# Patient Record
Sex: Female | Born: 1965
Health system: Southern US, Community
[De-identification: ages and names within clinical notes are randomized; demographics above are authoritative.]

## PROBLEM LIST (undated history)

## (undated) DIAGNOSIS — R413 Other amnesia: Secondary | ICD-10-CM

## (undated) DIAGNOSIS — R590 Localized enlarged lymph nodes: Secondary | ICD-10-CM

## (undated) DIAGNOSIS — M25561 Pain in right knee: Secondary | ICD-10-CM

## (undated) DIAGNOSIS — K30 Functional dyspepsia: Secondary | ICD-10-CM

## (undated) DIAGNOSIS — Z789 Other specified health status: Secondary | ICD-10-CM

## (undated) DIAGNOSIS — K224 Dyskinesia of esophagus: Secondary | ICD-10-CM

## (undated) DIAGNOSIS — T4145XA Adverse effect of unspecified anesthetic, initial encounter: Secondary | ICD-10-CM

## (undated) DIAGNOSIS — K589 Irritable bowel syndrome without diarrhea: Secondary | ICD-10-CM

## (undated) DIAGNOSIS — J302 Other seasonal allergic rhinitis: Secondary | ICD-10-CM

## (undated) DIAGNOSIS — Z95828 Presence of other vascular implants and grafts: Secondary | ICD-10-CM

## (undated) DIAGNOSIS — M8700 Idiopathic aseptic necrosis of unspecified bone: Secondary | ICD-10-CM

## (undated) DIAGNOSIS — D1803 Hemangioma of intra-abdominal structures: Secondary | ICD-10-CM

## (undated) DIAGNOSIS — G894 Chronic pain syndrome: Secondary | ICD-10-CM

## (undated) DIAGNOSIS — E890 Postprocedural hypothyroidism: Secondary | ICD-10-CM

## (undated) DIAGNOSIS — F1190 Opioid use, unspecified, uncomplicated: Secondary | ICD-10-CM

## (undated) DIAGNOSIS — A419 Sepsis, unspecified organism: Secondary | ICD-10-CM

## (undated) DIAGNOSIS — Z808 Family history of malignant neoplasm of other organs or systems: Secondary | ICD-10-CM

## (undated) DIAGNOSIS — R198 Other specified symptoms and signs involving the digestive system and abdomen: Secondary | ICD-10-CM

## (undated) DIAGNOSIS — Z9889 Other specified postprocedural states: Secondary | ICD-10-CM

## (undated) DIAGNOSIS — Z9289 Personal history of other medical treatment: Secondary | ICD-10-CM

## (undated) DIAGNOSIS — K760 Fatty (change of) liver, not elsewhere classified: Secondary | ICD-10-CM

## (undated) DIAGNOSIS — C4491 Basal cell carcinoma of skin, unspecified: Secondary | ICD-10-CM

## (undated) DIAGNOSIS — Z806 Family history of leukemia: Secondary | ICD-10-CM

## (undated) DIAGNOSIS — T8859XA Other complications of anesthesia, initial encounter: Secondary | ICD-10-CM

## (undated) DIAGNOSIS — F419 Anxiety disorder, unspecified: Secondary | ICD-10-CM

## (undated) DIAGNOSIS — M5430 Sciatica, unspecified side: Secondary | ICD-10-CM

## (undated) DIAGNOSIS — Z803 Family history of malignant neoplasm of breast: Secondary | ICD-10-CM

## (undated) DIAGNOSIS — Z8042 Family history of malignant neoplasm of prostate: Secondary | ICD-10-CM

## (undated) DIAGNOSIS — M5030 Other cervical disc degeneration, unspecified cervical region: Secondary | ICD-10-CM

## (undated) DIAGNOSIS — Z8041 Family history of malignant neoplasm of ovary: Secondary | ICD-10-CM

## (undated) DIAGNOSIS — M1990 Unspecified osteoarthritis, unspecified site: Secondary | ICD-10-CM

## (undated) DIAGNOSIS — C439 Malignant melanoma of skin, unspecified: Secondary | ICD-10-CM

## (undated) DIAGNOSIS — M797 Fibromyalgia: Secondary | ICD-10-CM

## (undated) DIAGNOSIS — R1310 Dysphagia, unspecified: Secondary | ICD-10-CM

## (undated) DIAGNOSIS — D649 Anemia, unspecified: Secondary | ICD-10-CM

## (undated) DIAGNOSIS — M5136 Other intervertebral disc degeneration, lumbar region: Secondary | ICD-10-CM

## (undated) DIAGNOSIS — J189 Pneumonia, unspecified organism: Secondary | ICD-10-CM

## (undated) DIAGNOSIS — R112 Nausea with vomiting, unspecified: Secondary | ICD-10-CM

## (undated) DIAGNOSIS — I10 Essential (primary) hypertension: Secondary | ICD-10-CM

## (undated) DIAGNOSIS — Z87442 Personal history of urinary calculi: Secondary | ICD-10-CM

## (undated) DIAGNOSIS — J45909 Unspecified asthma, uncomplicated: Secondary | ICD-10-CM

## (undated) DIAGNOSIS — K449 Diaphragmatic hernia without obstruction or gangrene: Secondary | ICD-10-CM

## (undated) DIAGNOSIS — Z8489 Family history of other specified conditions: Secondary | ICD-10-CM

## (undated) DIAGNOSIS — IMO0002 Reserved for concepts with insufficient information to code with codable children: Secondary | ICD-10-CM

## (undated) DIAGNOSIS — Z85828 Personal history of other malignant neoplasm of skin: Secondary | ICD-10-CM

## (undated) DIAGNOSIS — N3010 Interstitial cystitis (chronic) without hematuria: Secondary | ICD-10-CM

## (undated) DIAGNOSIS — M51369 Other intervertebral disc degeneration, lumbar region without mention of lumbar back pain or lower extremity pain: Secondary | ICD-10-CM

## (undated) DIAGNOSIS — G43711 Chronic migraine without aura, intractable, with status migrainosus: Secondary | ICD-10-CM

## (undated) DIAGNOSIS — Z87898 Personal history of other specified conditions: Secondary | ICD-10-CM

## (undated) DIAGNOSIS — M2560 Stiffness of unspecified joint, not elsewhere classified: Secondary | ICD-10-CM

## (undated) HISTORY — PX: CARPAL TUNNEL RELEASE: SHX101

## (undated) HISTORY — PX: TONSILLECTOMY AND ADENOIDECTOMY: SUR1326

## (undated) HISTORY — PX: REFRACTIVE SURGERY: SHX103

## (undated) HISTORY — PX: BREAST SURGERY: SHX581

## (undated) HISTORY — PX: AUGMENTATION MAMMAPLASTY: SUR837

## (undated) HISTORY — PX: OTHER SURGICAL HISTORY: SHX169

## (undated) HISTORY — DX: Sepsis, unspecified organism: A41.9

## (undated) HISTORY — PX: KIDNEY STONE SURGERY: SHX686

## (undated) HISTORY — DX: Family history of leukemia: Z80.6

## (undated) HISTORY — DX: Idiopathic aseptic necrosis of unspecified bone: M87.00

## (undated) HISTORY — PX: EXCISION HAGLUND'S DEFORMITY WITH ACHILLES TENDON REPAIR: SHX5627

## (undated) HISTORY — PX: SHOULDER ARTHROSCOPY: SHX128

## (undated) HISTORY — PX: APPENDECTOMY: SHX54

## (undated) HISTORY — DX: Family history of malignant neoplasm of prostate: Z80.42

## (undated) HISTORY — DX: Family history of malignant neoplasm of other organs or systems: Z80.8

## (undated) HISTORY — DX: Irritable bowel syndrome, unspecified: K58.9

## (undated) HISTORY — DX: Family history of malignant neoplasm of breast: Z80.3

## (undated) HISTORY — PX: TOTAL KNEE ARTHROPLASTY: SHX125

## (undated) HISTORY — DX: Family history of malignant neoplasm of ovary: Z80.41

## (undated) HISTORY — PX: FISSURECTOMY: SHX5244

---

## 1991-02-23 HISTORY — PX: TOTAL THYROIDECTOMY: SHX2547

## 1996-02-23 HISTORY — PX: OTHER SURGICAL HISTORY: SHX169

## 1997-03-25 HISTORY — PX: LAPAROSCOPIC ASSISTED VAGINAL HYSTERECTOMY: SHX5398

## 1997-05-28 ENCOUNTER — Ambulatory Visit (HOSPITAL_COMMUNITY): Admission: RE | Admit: 1997-05-28 | Discharge: 1997-05-28 | Payer: Self-pay | Admitting: *Deleted

## 1997-09-02 ENCOUNTER — Emergency Department (HOSPITAL_COMMUNITY): Admission: EM | Admit: 1997-09-02 | Discharge: 1997-09-02 | Payer: Self-pay | Admitting: Emergency Medicine

## 1997-09-23 ENCOUNTER — Emergency Department (HOSPITAL_COMMUNITY): Admission: EM | Admit: 1997-09-23 | Discharge: 1997-09-23 | Payer: Self-pay | Admitting: Emergency Medicine

## 1997-10-22 ENCOUNTER — Inpatient Hospital Stay (HOSPITAL_COMMUNITY): Admission: EM | Admit: 1997-10-22 | Discharge: 1997-10-24 | Payer: Self-pay | Admitting: Emergency Medicine

## 1997-11-10 ENCOUNTER — Emergency Department (HOSPITAL_COMMUNITY): Admission: EM | Admit: 1997-11-10 | Discharge: 1997-11-10 | Payer: Self-pay

## 1997-11-13 ENCOUNTER — Ambulatory Visit (HOSPITAL_COMMUNITY): Admission: RE | Admit: 1997-11-13 | Discharge: 1997-11-13 | Payer: Self-pay | Admitting: Gastroenterology

## 1997-11-14 ENCOUNTER — Ambulatory Visit (HOSPITAL_COMMUNITY): Admission: RE | Admit: 1997-11-14 | Discharge: 1997-11-14 | Payer: Self-pay | Admitting: Gastroenterology

## 1997-11-14 ENCOUNTER — Encounter: Payer: Self-pay | Admitting: Gastroenterology

## 1997-12-31 ENCOUNTER — Encounter: Payer: Self-pay | Admitting: Internal Medicine

## 1997-12-31 ENCOUNTER — Ambulatory Visit (HOSPITAL_COMMUNITY): Admission: RE | Admit: 1997-12-31 | Discharge: 1997-12-31 | Payer: Self-pay | Admitting: Internal Medicine

## 1998-02-27 ENCOUNTER — Encounter: Admission: RE | Admit: 1998-02-27 | Discharge: 1998-03-24 | Payer: Self-pay | Admitting: Orthopedic Surgery

## 1998-04-08 ENCOUNTER — Ambulatory Visit (HOSPITAL_BASED_OUTPATIENT_CLINIC_OR_DEPARTMENT_OTHER): Admission: RE | Admit: 1998-04-08 | Discharge: 1998-04-08 | Payer: Self-pay | Admitting: Orthopaedic Surgery

## 1998-10-22 ENCOUNTER — Ambulatory Visit (HOSPITAL_COMMUNITY): Admission: RE | Admit: 1998-10-22 | Discharge: 1998-10-22 | Payer: Self-pay | Admitting: Family Medicine

## 1998-10-22 ENCOUNTER — Encounter: Payer: Self-pay | Admitting: Family Medicine

## 1998-12-30 ENCOUNTER — Ambulatory Visit (HOSPITAL_COMMUNITY): Admission: RE | Admit: 1998-12-30 | Discharge: 1998-12-30 | Payer: Self-pay | Admitting: Internal Medicine

## 1999-02-11 ENCOUNTER — Ambulatory Visit (HOSPITAL_COMMUNITY): Admission: RE | Admit: 1999-02-11 | Discharge: 1999-02-11 | Payer: Self-pay | Admitting: Internal Medicine

## 1999-03-10 ENCOUNTER — Emergency Department (HOSPITAL_COMMUNITY): Admission: EM | Admit: 1999-03-10 | Discharge: 1999-03-10 | Payer: Self-pay | Admitting: Emergency Medicine

## 1999-07-08 ENCOUNTER — Encounter: Payer: Self-pay | Admitting: Emergency Medicine

## 1999-07-08 ENCOUNTER — Emergency Department (HOSPITAL_COMMUNITY): Admission: EM | Admit: 1999-07-08 | Discharge: 1999-07-08 | Payer: Self-pay | Admitting: Emergency Medicine

## 1999-12-01 ENCOUNTER — Ambulatory Visit (HOSPITAL_BASED_OUTPATIENT_CLINIC_OR_DEPARTMENT_OTHER): Admission: RE | Admit: 1999-12-01 | Discharge: 1999-12-01 | Payer: Self-pay | Admitting: Orthopaedic Surgery

## 2000-01-28 ENCOUNTER — Other Ambulatory Visit: Admission: RE | Admit: 2000-01-28 | Discharge: 2000-01-28 | Payer: Self-pay | Admitting: *Deleted

## 2000-03-07 ENCOUNTER — Ambulatory Visit (HOSPITAL_COMMUNITY): Admission: RE | Admit: 2000-03-07 | Discharge: 2000-03-07 | Payer: Self-pay | Admitting: Gastroenterology

## 2000-03-23 ENCOUNTER — Ambulatory Visit (HOSPITAL_COMMUNITY): Admission: RE | Admit: 2000-03-23 | Discharge: 2000-03-23 | Payer: Self-pay | Admitting: Gastroenterology

## 2000-03-23 ENCOUNTER — Encounter: Payer: Self-pay | Admitting: Gastroenterology

## 2000-04-15 ENCOUNTER — Ambulatory Visit (HOSPITAL_COMMUNITY): Admission: RE | Admit: 2000-04-15 | Discharge: 2000-04-15 | Payer: Self-pay | Admitting: Gastroenterology

## 2000-04-18 ENCOUNTER — Encounter (INDEPENDENT_AMBULATORY_CARE_PROVIDER_SITE_OTHER): Payer: Self-pay | Admitting: Specialist

## 2000-04-18 ENCOUNTER — Ambulatory Visit (HOSPITAL_COMMUNITY): Admission: RE | Admit: 2000-04-18 | Discharge: 2000-04-18 | Payer: Self-pay | Admitting: Obstetrics and Gynecology

## 2000-04-18 HISTORY — PX: OTHER SURGICAL HISTORY: SHX169

## 2000-06-06 ENCOUNTER — Encounter: Payer: Self-pay | Admitting: Family Medicine

## 2000-06-06 ENCOUNTER — Ambulatory Visit (HOSPITAL_COMMUNITY): Admission: RE | Admit: 2000-06-06 | Discharge: 2000-06-06 | Payer: Self-pay | Admitting: Family Medicine

## 2000-07-06 ENCOUNTER — Encounter: Payer: Self-pay | Admitting: Emergency Medicine

## 2000-07-06 ENCOUNTER — Inpatient Hospital Stay (HOSPITAL_COMMUNITY): Admission: EM | Admit: 2000-07-06 | Discharge: 2000-07-09 | Payer: Self-pay | Admitting: Emergency Medicine

## 2000-08-16 ENCOUNTER — Ambulatory Visit (HOSPITAL_BASED_OUTPATIENT_CLINIC_OR_DEPARTMENT_OTHER): Admission: RE | Admit: 2000-08-16 | Discharge: 2000-08-16 | Payer: Self-pay | Admitting: Orthopaedic Surgery

## 2000-08-16 HISTORY — PX: KNEE ARTHROSCOPY: SUR90

## 2000-09-27 ENCOUNTER — Emergency Department (HOSPITAL_COMMUNITY): Admission: EM | Admit: 2000-09-27 | Discharge: 2000-09-27 | Payer: Self-pay | Admitting: Emergency Medicine

## 2000-09-27 ENCOUNTER — Encounter: Payer: Self-pay | Admitting: Emergency Medicine

## 2000-10-26 ENCOUNTER — Ambulatory Visit (HOSPITAL_COMMUNITY): Admission: RE | Admit: 2000-10-26 | Discharge: 2000-10-26 | Payer: Self-pay | Admitting: Gastroenterology

## 2000-10-26 HISTORY — PX: ESOPHAGOGASTRODUODENOSCOPY (EGD) WITH ESOPHAGEAL DILATION: SHX5812

## 2000-11-29 ENCOUNTER — Ambulatory Visit (HOSPITAL_BASED_OUTPATIENT_CLINIC_OR_DEPARTMENT_OTHER): Admission: RE | Admit: 2000-11-29 | Discharge: 2000-11-30 | Payer: Self-pay | Admitting: Orthopaedic Surgery

## 2000-11-29 HISTORY — PX: KNEE ARTHROSCOPY WITH FULKERSON SLIDE: SHX5648

## 2001-03-13 ENCOUNTER — Encounter: Admission: RE | Admit: 2001-03-13 | Discharge: 2001-06-11 | Payer: Self-pay | Admitting: Anesthesiology

## 2001-05-20 ENCOUNTER — Emergency Department (HOSPITAL_COMMUNITY): Admission: EM | Admit: 2001-05-20 | Discharge: 2001-05-20 | Payer: Self-pay | Admitting: Emergency Medicine

## 2001-05-30 ENCOUNTER — Ambulatory Visit (HOSPITAL_BASED_OUTPATIENT_CLINIC_OR_DEPARTMENT_OTHER): Admission: RE | Admit: 2001-05-30 | Discharge: 2001-05-30 | Payer: Self-pay | Admitting: Anesthesiology

## 2001-05-31 ENCOUNTER — Emergency Department (HOSPITAL_COMMUNITY): Admission: EM | Admit: 2001-05-31 | Discharge: 2001-05-31 | Payer: Self-pay | Admitting: Emergency Medicine

## 2001-06-01 ENCOUNTER — Ambulatory Visit (HOSPITAL_COMMUNITY): Admission: RE | Admit: 2001-06-01 | Discharge: 2001-06-01 | Payer: Self-pay | Admitting: Family Medicine

## 2001-06-01 ENCOUNTER — Encounter: Payer: Self-pay | Admitting: Family Medicine

## 2001-06-01 ENCOUNTER — Encounter: Payer: Self-pay | Admitting: Emergency Medicine

## 2001-06-03 ENCOUNTER — Emergency Department (HOSPITAL_COMMUNITY): Admission: EM | Admit: 2001-06-03 | Discharge: 2001-06-03 | Payer: Self-pay | Admitting: Emergency Medicine

## 2001-06-03 ENCOUNTER — Encounter: Payer: Self-pay | Admitting: Emergency Medicine

## 2001-06-08 ENCOUNTER — Ambulatory Visit (HOSPITAL_COMMUNITY): Admission: RE | Admit: 2001-06-08 | Discharge: 2001-06-08 | Payer: Self-pay | Admitting: Family Medicine

## 2001-06-08 ENCOUNTER — Encounter: Payer: Self-pay | Admitting: Family Medicine

## 2001-07-10 ENCOUNTER — Encounter: Admission: RE | Admit: 2001-07-10 | Discharge: 2001-10-08 | Payer: Self-pay

## 2001-07-26 ENCOUNTER — Ambulatory Visit (HOSPITAL_BASED_OUTPATIENT_CLINIC_OR_DEPARTMENT_OTHER): Admission: RE | Admit: 2001-07-26 | Discharge: 2001-07-26 | Payer: Self-pay | Admitting: Orthopedic Surgery

## 2001-09-09 ENCOUNTER — Observation Stay (HOSPITAL_COMMUNITY): Admission: AD | Admit: 2001-09-09 | Discharge: 2001-09-10 | Payer: Self-pay | Admitting: Pediatrics

## 2001-09-09 ENCOUNTER — Encounter: Payer: Self-pay | Admitting: Pediatrics

## 2001-11-02 ENCOUNTER — Encounter: Payer: Self-pay | Admitting: Family Medicine

## 2001-11-02 ENCOUNTER — Encounter: Admission: RE | Admit: 2001-11-02 | Discharge: 2001-11-02 | Payer: Self-pay | Admitting: *Deleted

## 2001-11-14 ENCOUNTER — Emergency Department (HOSPITAL_COMMUNITY): Admission: EM | Admit: 2001-11-14 | Discharge: 2001-11-14 | Payer: Self-pay | Admitting: Emergency Medicine

## 2001-12-12 ENCOUNTER — Ambulatory Visit (HOSPITAL_COMMUNITY): Admission: RE | Admit: 2001-12-12 | Discharge: 2001-12-12 | Payer: Self-pay | Admitting: Orthopedic Surgery

## 2001-12-12 ENCOUNTER — Encounter: Payer: Self-pay | Admitting: Orthopedic Surgery

## 2002-01-01 ENCOUNTER — Encounter: Admission: RE | Admit: 2002-01-01 | Discharge: 2002-03-13 | Payer: Self-pay | Admitting: Anesthesiology

## 2002-01-05 ENCOUNTER — Encounter: Payer: Self-pay | Admitting: Emergency Medicine

## 2002-01-05 ENCOUNTER — Emergency Department (HOSPITAL_COMMUNITY): Admission: EM | Admit: 2002-01-05 | Discharge: 2002-01-05 | Payer: Self-pay | Admitting: Emergency Medicine

## 2002-03-04 ENCOUNTER — Inpatient Hospital Stay (HOSPITAL_COMMUNITY): Admission: EM | Admit: 2002-03-04 | Discharge: 2002-03-05 | Payer: Self-pay | Admitting: Emergency Medicine

## 2002-03-04 ENCOUNTER — Encounter: Payer: Self-pay | Admitting: Emergency Medicine

## 2002-03-04 ENCOUNTER — Encounter (INDEPENDENT_AMBULATORY_CARE_PROVIDER_SITE_OTHER): Payer: Self-pay

## 2002-03-04 ENCOUNTER — Encounter: Payer: Self-pay | Admitting: Surgery

## 2002-03-04 HISTORY — PX: LAPAROSCOPIC CHOLECYSTECTOMY: SUR755

## 2002-03-13 ENCOUNTER — Encounter: Admission: RE | Admit: 2002-03-13 | Discharge: 2002-06-11 | Payer: Self-pay | Admitting: Anesthesiology

## 2002-04-04 ENCOUNTER — Observation Stay (HOSPITAL_COMMUNITY): Admission: EM | Admit: 2002-04-04 | Discharge: 2002-04-04 | Payer: Self-pay | Admitting: Emergency Medicine

## 2002-04-10 ENCOUNTER — Ambulatory Visit (HOSPITAL_BASED_OUTPATIENT_CLINIC_OR_DEPARTMENT_OTHER): Admission: RE | Admit: 2002-04-10 | Discharge: 2002-04-10 | Payer: Self-pay | Admitting: Orthopedic Surgery

## 2002-04-10 HISTORY — PX: OTHER SURGICAL HISTORY: SHX169

## 2002-08-05 ENCOUNTER — Emergency Department (HOSPITAL_COMMUNITY): Admission: EM | Admit: 2002-08-05 | Discharge: 2002-08-06 | Payer: Self-pay | Admitting: Emergency Medicine

## 2002-10-03 ENCOUNTER — Ambulatory Visit (HOSPITAL_BASED_OUTPATIENT_CLINIC_OR_DEPARTMENT_OTHER): Admission: RE | Admit: 2002-10-03 | Discharge: 2002-10-03 | Payer: Self-pay | Admitting: Orthopedic Surgery

## 2002-10-03 HISTORY — PX: OTHER SURGICAL HISTORY: SHX169

## 2003-01-29 ENCOUNTER — Ambulatory Visit (HOSPITAL_BASED_OUTPATIENT_CLINIC_OR_DEPARTMENT_OTHER): Admission: RE | Admit: 2003-01-29 | Discharge: 2003-01-29 | Payer: Self-pay | Admitting: Orthopedic Surgery

## 2003-01-29 HISTORY — PX: OTHER SURGICAL HISTORY: SHX169

## 2003-02-11 ENCOUNTER — Inpatient Hospital Stay (HOSPITAL_COMMUNITY): Admission: EM | Admit: 2003-02-11 | Discharge: 2003-02-15 | Payer: Self-pay | Admitting: Emergency Medicine

## 2003-02-25 ENCOUNTER — Ambulatory Visit (HOSPITAL_COMMUNITY): Admission: RE | Admit: 2003-02-25 | Discharge: 2003-02-25 | Payer: Self-pay

## 2003-03-23 ENCOUNTER — Emergency Department (HOSPITAL_COMMUNITY): Admission: EM | Admit: 2003-03-23 | Discharge: 2003-03-23 | Payer: Self-pay | Admitting: Emergency Medicine

## 2003-04-10 ENCOUNTER — Ambulatory Visit (HOSPITAL_BASED_OUTPATIENT_CLINIC_OR_DEPARTMENT_OTHER): Admission: RE | Admit: 2003-04-10 | Discharge: 2003-04-10 | Payer: Self-pay | Admitting: Urology

## 2003-04-10 ENCOUNTER — Encounter (INDEPENDENT_AMBULATORY_CARE_PROVIDER_SITE_OTHER): Payer: Self-pay | Admitting: *Deleted

## 2003-04-10 HISTORY — PX: OTHER SURGICAL HISTORY: SHX169

## 2003-07-03 ENCOUNTER — Emergency Department (HOSPITAL_COMMUNITY): Admission: EM | Admit: 2003-07-03 | Discharge: 2003-07-04 | Payer: Self-pay | Admitting: Emergency Medicine

## 2003-07-09 ENCOUNTER — Encounter: Admission: RE | Admit: 2003-07-09 | Discharge: 2003-07-09 | Payer: Self-pay | Admitting: Family Medicine

## 2003-09-10 ENCOUNTER — Inpatient Hospital Stay (HOSPITAL_COMMUNITY): Admission: RE | Admit: 2003-09-10 | Discharge: 2003-09-15 | Payer: Self-pay | Admitting: Orthopedic Surgery

## 2003-12-11 ENCOUNTER — Encounter
Admission: RE | Admit: 2003-12-11 | Discharge: 2004-03-10 | Payer: Self-pay | Admitting: Physical Medicine and Rehabilitation

## 2003-12-13 ENCOUNTER — Ambulatory Visit: Payer: Self-pay | Admitting: Physical Medicine and Rehabilitation

## 2003-12-26 ENCOUNTER — Ambulatory Visit: Payer: Self-pay | Admitting: Physical Medicine & Rehabilitation

## 2004-02-23 HISTORY — PX: CYSTOSCOPY WITH STENT PLACEMENT: SHX5790

## 2004-03-24 ENCOUNTER — Encounter
Admission: RE | Admit: 2004-03-24 | Discharge: 2004-06-22 | Payer: Self-pay | Admitting: Physical Medicine & Rehabilitation

## 2004-03-24 ENCOUNTER — Ambulatory Visit: Payer: Self-pay | Admitting: Physical Medicine & Rehabilitation

## 2004-03-31 ENCOUNTER — Ambulatory Visit: Payer: Self-pay | Admitting: Anesthesiology

## 2004-09-17 ENCOUNTER — Ambulatory Visit (HOSPITAL_COMMUNITY): Admission: RE | Admit: 2004-09-17 | Discharge: 2004-09-17 | Payer: Self-pay | Admitting: Family Medicine

## 2004-10-08 ENCOUNTER — Other Ambulatory Visit: Admission: RE | Admit: 2004-10-08 | Discharge: 2004-10-08 | Payer: Self-pay | Admitting: Obstetrics and Gynecology

## 2004-12-20 ENCOUNTER — Emergency Department (HOSPITAL_COMMUNITY): Admission: EM | Admit: 2004-12-20 | Discharge: 2004-12-21 | Payer: Self-pay | Admitting: Emergency Medicine

## 2005-08-20 ENCOUNTER — Encounter: Admission: RE | Admit: 2005-08-20 | Discharge: 2005-08-20 | Payer: Self-pay | Admitting: Gastroenterology

## 2005-10-24 ENCOUNTER — Emergency Department (HOSPITAL_COMMUNITY): Admission: EM | Admit: 2005-10-24 | Discharge: 2005-10-25 | Payer: Self-pay | Admitting: Emergency Medicine

## 2006-08-25 ENCOUNTER — Encounter: Admission: RE | Admit: 2006-08-25 | Discharge: 2006-08-25 | Payer: Self-pay | Admitting: Orthopedic Surgery

## 2007-03-14 ENCOUNTER — Inpatient Hospital Stay (HOSPITAL_COMMUNITY): Admission: RE | Admit: 2007-03-14 | Discharge: 2007-03-18 | Payer: Self-pay | Admitting: Orthopedic Surgery

## 2007-03-14 HISTORY — PX: OTHER SURGICAL HISTORY: SHX169

## 2008-01-30 ENCOUNTER — Emergency Department (HOSPITAL_COMMUNITY): Admission: EM | Admit: 2008-01-30 | Discharge: 2008-01-30 | Payer: Self-pay | Admitting: Emergency Medicine

## 2008-03-08 ENCOUNTER — Encounter (INDEPENDENT_AMBULATORY_CARE_PROVIDER_SITE_OTHER): Payer: Self-pay | Admitting: Urology

## 2008-03-08 ENCOUNTER — Ambulatory Visit (HOSPITAL_BASED_OUTPATIENT_CLINIC_OR_DEPARTMENT_OTHER): Admission: RE | Admit: 2008-03-08 | Discharge: 2008-03-08 | Payer: Self-pay | Admitting: Urology

## 2008-03-08 HISTORY — PX: OTHER SURGICAL HISTORY: SHX169

## 2008-06-24 ENCOUNTER — Encounter: Admission: RE | Admit: 2008-06-24 | Discharge: 2008-06-24 | Payer: Self-pay | Admitting: Family Medicine

## 2008-07-16 ENCOUNTER — Ambulatory Visit (HOSPITAL_COMMUNITY): Admission: RE | Admit: 2008-07-16 | Discharge: 2008-07-16 | Payer: Self-pay | Admitting: Orthopedic Surgery

## 2009-03-10 ENCOUNTER — Inpatient Hospital Stay (HOSPITAL_COMMUNITY): Admission: RE | Admit: 2009-03-10 | Discharge: 2009-03-11 | Payer: Self-pay | Admitting: *Deleted

## 2009-03-10 HISTORY — PX: ANTERIOR CERVICAL DECOMP/DISCECTOMY FUSION: SHX1161

## 2009-05-21 ENCOUNTER — Encounter: Admission: RE | Admit: 2009-05-21 | Discharge: 2009-05-21 | Payer: Self-pay | Admitting: Otolaryngology

## 2009-06-27 ENCOUNTER — Encounter: Admission: RE | Admit: 2009-06-27 | Discharge: 2009-06-27 | Payer: Self-pay | Admitting: Family Medicine

## 2009-09-16 ENCOUNTER — Ambulatory Visit (HOSPITAL_COMMUNITY): Admission: RE | Admit: 2009-09-16 | Discharge: 2009-09-16 | Payer: Self-pay | Admitting: Orthopedic Surgery

## 2009-10-01 ENCOUNTER — Ambulatory Visit: Payer: Self-pay | Admitting: Interventional Radiology

## 2009-10-01 ENCOUNTER — Emergency Department (HOSPITAL_BASED_OUTPATIENT_CLINIC_OR_DEPARTMENT_OTHER): Admission: EM | Admit: 2009-10-01 | Discharge: 2009-10-01 | Payer: Self-pay | Admitting: Emergency Medicine

## 2009-11-17 ENCOUNTER — Ambulatory Visit (HOSPITAL_COMMUNITY): Admission: RE | Admit: 2009-11-17 | Discharge: 2009-11-17 | Payer: Self-pay | Admitting: Family Medicine

## 2010-03-14 ENCOUNTER — Encounter: Payer: Self-pay | Admitting: Internal Medicine

## 2010-05-10 LAB — COMPREHENSIVE METABOLIC PANEL
AST: 22 U/L (ref 0–37)
CO2: 33 mEq/L — ABNORMAL HIGH (ref 19–32)
Calcium: 9.5 mg/dL (ref 8.4–10.5)
Creatinine, Ser: 0.88 mg/dL (ref 0.4–1.2)
GFR calc Af Amer: >60 mL/min (ref >60)
GFR calc non Af Amer: >60 mL/min (ref >60)
Glucose, Bld: 87 mg/dL (ref 70–99)

## 2010-05-10 LAB — URINALYSIS, ROUTINE W REFLEX MICROSCOPIC
Bilirubin Urine: NEGATIVE
Nitrite: NEGATIVE
Specific Gravity, Urine: 1.017 (ref 1.005–1.030)
Urobilinogen, UA: 0.2 mg/dL (ref 0.0–1.0)

## 2010-05-10 LAB — TYPE AND SCREEN
ABO/RH(D): A POS
Antibody Screen: NEGATIVE

## 2010-05-10 LAB — URINE MICROSCOPIC-ADD ON

## 2010-05-10 LAB — PROTIME-INR
INR: 0.87 (ref 0.00–1.49)
Prothrombin Time: 11.8 seconds (ref 11.6–15.2)

## 2010-05-10 LAB — CBC
MCHC: 34.3 g/dL (ref 30.0–36.0)
MCV: 94.3 fL (ref 78.0–100.0)
RBC: 4.56 MIL/uL (ref 3.87–5.11)

## 2010-05-10 LAB — APTT: aPTT: 31 seconds (ref 24–37)

## 2010-05-10 LAB — DIFFERENTIAL
Lymphocytes Relative: 29 % (ref 12–46)
Lymphs Abs: 2.2 10*3/uL (ref 0.7–4.0)
Neutro Abs: 4.5 10*3/uL (ref 1.7–7.7)
Neutrophils Relative %: 61 % (ref 43–77)

## 2010-05-11 ENCOUNTER — Other Ambulatory Visit: Payer: Self-pay | Admitting: Family Medicine

## 2010-05-11 DIAGNOSIS — Z1231 Encounter for screening mammogram for malignant neoplasm of breast: Secondary | ICD-10-CM

## 2010-05-14 ENCOUNTER — Encounter (INDEPENDENT_AMBULATORY_CARE_PROVIDER_SITE_OTHER): Payer: Self-pay | Admitting: *Deleted

## 2010-05-21 NOTE — Letter (Signed)
Summary: New Patient letter  [facility]  [address] [place], [place] [address]   Phone: [phone]  Fax: [phone]       05/14/2010 MRN: [id number]  [facility] [address] [place], [place]  [address]  Dear [name],  Welcome to the Gastroenterology Division at [facility].    You are scheduled to see Dr.  Juanda Chance on 07-06-10 at 1:30P.M. on the 3rd floor at [facility], [address]  We ask that you try to arrive at our office 15 minutes prior to your appointment time to allow for check-in.  We would like you to complete the enclosed self-administered evaluation form prior to your visit and bring it with you on the day of your appointment.  We will review it with you.  Also, please bring a complete list of all your medications or, if you prefer, bring the medication bottles and we will list them.  Please bring your insurance card so that we may make a copy of it.  If your insurance requires a referral to see a specialist, please bring your referral form from your primary care physician.  Co-payments are due at the time of your visit and may be paid by cash, check or credit card.     Your office visit will consist of a consult with your physician (includes a physical exam), any laboratory testing he/she may order, scheduling of any necessary diagnostic testing (e.g. x-ray, ultrasound, CT-scan), and scheduling of a procedure (e.g. Endoscopy, Colonoscopy) if required.  Please allow enough time on your schedule to allow for any/all of these possibilities.    If you cannot keep your appointment, please call [phone] to cancel or reschedule prior to your appointment date.  This allows [place] the opportunity to schedule an appointment for another patient in need of care.  If you do not cancel or reschedule by 5 p.m. the business day prior to your appointment date, you will be charged a $50.00 late cancellation/no-show fee.    Thank you for  choosing [facility] for your medical needs.  We appreciate the opportunity to care for you.  Please visit [place] at our website  to learn more about our practice.                     Sincerely,                                                             The Gastroenterology Division

## 2010-06-29 ENCOUNTER — Other Ambulatory Visit: Payer: Self-pay | Admitting: Internal Medicine

## 2010-06-29 ENCOUNTER — Ambulatory Visit
Admission: RE | Admit: 2010-06-29 | Discharge: 2010-06-29 | Disposition: A | Discharge status: Home / Self Care | Payer: Private Health Insurance - Indemnity | Source: Ambulatory Visit | Attending: Family Medicine | Admitting: Family Medicine

## 2010-06-29 DIAGNOSIS — Z1231 Encounter for screening mammogram for malignant neoplasm of breast: Secondary | ICD-10-CM

## 2010-07-03 ENCOUNTER — Telehealth: Payer: Self-pay | Admitting: *Deleted

## 2010-07-03 NOTE — Telephone Encounter (Signed)
Pt. Called and was advised that Dr. Marina Goodell declined to accept her as a patient with him.  Patient saw Dr. Juanda Chance here in the past and also saw Dr. Madilyn Fireman in October 2011.  We Lavonna Rua and me) explained to her in great detail that our practice policy is that we do not see a patient that has seen other GI providers in same practice unless approved by the doctors involved.  Pt. Requested that we mail back her records from Dr. Madilyn Fireman that she gave [place] for Dr. Marina Goodell to review.  I mailed them out today.

## 2010-07-06 ENCOUNTER — Ambulatory Visit: Payer: Self-pay | Admitting: Internal Medicine

## 2010-07-07 NOTE — Discharge Summary (Signed)
Kari Green, Kari Green            ACCOUNT NO.:  [id number]   MEDICAL RECORD NO.:  [id number]          PATIENT TYPE:  INP   LOCATION:  1619                         FACILITY:  [facility]   PHYSICIAN:  Deidre Ala, M.D.    DATE OF BIRTH:  1965-04-12   DATE OF ADMISSION:  03/14/2007  DATE OF DISCHARGE:  03/18/2007                               DISCHARGE SUMMARY   ADDENDUM  In further discussion with the patient, she did experience some  dizziness during the day on March 17, 2007, and because of her  hemoglobin was 8.8, we decided she should get 2 units of blood, repeat  the H&H in the morning, and if it is okay she will be discharged on  March 18, 2007 in satisfactory and stable condition.      Phineas Semen, P.A.    ______________________________  Seth Bake. Charlesetta Shanks, M.D.    CL/MEDQ  D:  03/17/2007  T:  03/18/2007  Job:  [id number]

## 2010-07-07 NOTE — Op Note (Signed)
[name], Kari Green            ACCOUNT NO.:  [id number]   MEDICAL RECORD NO.:  [id number]          PATIENT TYPE:  INP   LOCATION:  0002                         FACILITY:  [facility]   PHYSICIAN:  Deidre Ala, M.D.    DATE OF BIRTH:  July 28, 1965   DATE OF PROCEDURE:  03/14/2007  DATE OF DISCHARGE:                               OPERATIVE REPORT   PREOPERATIVE DIAGNOSIS:  Progressive degenerative joint disease, right  knee; status post patellofemoral replacement 3 years ago.   POSTOPERATIVE DIAGNOSIS:  Progressive degenerative joint disease, right  knee; status post patellofemoral replacement 3 years ago.   PROCEDURES:  1. Right total knee revision with removal of trochlear implant  2. Total knee arthroplasty, using cemented DePuy components LCS type;      with rotating platform within MB stem cemented.   SURGEON:  1. Charlesetta Shanks, M.D.   ASSISTANT:  Phineas Semen, P.A.   ANESTHESIA:  General endotracheal with femoral nerve block.   ESTIMATED BLOOD LOSS:  Less than 100 mL replaced without.   TOURNIQUET TIME:  84 minutes.   PATHOLOGIC FINDINGS AND HISTORY:  Kari Green is a 45 year old female who had  unremitting patellofemoral pain, and underwent 3 years ago in January  2006 a right patellofemoral replacement.  She initially did well but  gradually and progressively had more and more medial knee pain.  She had  a mild femoral defect medially, and we felt she was weighting the medial  tibial plateau.  Because her pain level increased and we gave her  Supartz, she ultimately came to total knee arthroplasty with revision  removal of the trochlea component, leaving the patella.  We did feel  that shunting the weight down the tibia was appropriate to unweight the  medial tibial plateau using a long stem prosthesis.  We were able to get  in an a 14, so there was a clothespin tip.  We did find tricompartmental  disease and we had a lot of scar in the patellar fat pad, which we  excised.   We fitted her to a medium right tibia, a #2 tibial tray with a  14 x 75 stem; all cemented using tobramycin in the cement, with 2  batches of tobramycin 1.3 grams each.  The patella was still intact and  was not revised.  We had full extension with slight posterior sag, and  good ligamentous stability with flexion to 110 degrees.   PROCEDURE:  With adequate anesthesia obtained using LMA technique after  a femoral nerve block, the patient was placed in the supine position.  The right lower extremity was prepped from the toes to the tourniquet in  a standard fashion.  After standard prepping and draping, Esmarch  exsanguination was used.  The tourniquet was let up to 350 mmHg.  The  old skin incision was then incised.  The incision was deepened sharply  with a knife and hemostasis obtained using the Bovie electrocoagulator.  A flap was developed over the patella thickly laterally.  We then made a  median parapatellar retinacular incision and everted the patella.  Scar  tissue around the patella  and patellar tendon was sharply removed.  I  then excised both cruciates and the menisci.  I then removed the  trochlear component with an osteotome, removing most of the cement at  the same time.  I then made a saw incision and flattened the tibial  spines.  I then entered the tibial canal using an intermedullary reamer,  and subsequently reamed up to a 14.  I then placed a tibial cutting jig  in place, 2 degree; pinned it and made the cut.  I later cut an  additional 2.5 mm.  I then sized the femur to a medium, placed an  intermedullary guide and set it; placed the C-clamp on 12.5 for the  rotational setting and pinned it.  We had first tried a standard made  that cut.  I then felt it needed to be downsized a bit and actually cut  more on the flexion gap; so I sized it down to a medium; made the  anterior, posterior and chamfer cuts, and fit 10 mm in flexion.  I then  placed the 4-degree distal  valgus femoral cutting jig in place, made  that cut and then cut 5 more millimeters; ultimately fitting 10 mm in  extension, so that the flexion and extension gap matched at 10 mm for a  medium femur.  I then placed the finishing guide for the medium femur.  made the anterior, posterior and chamfer cuts, as well as the notch cut.  I then sized the tibia and sized it to a 2.  I made the central proximal  ream, and then I reamed down ultimately to accommodate the 14 x 75 stem.  I then placed a trial 14 x 75 with 2 tibial tray in the appropriate  rotation, and impacted it with the keel punch.  I then placed the 10 mm  rotating platform trial.  I then impacted on the femoral component and  articulated the knee through a range of motion.  The patella was left  alone, except that there was some inside tearing of the tendon just  underneath the inferior pole of the patella, and I sutured that back  with #1 Vicryl side-to-side with the knot buried.  The remainder the  tendon completely surrounding it anteriorly was completely intact.  We  then thoroughly jet lavaged the knee, while we checked components for  sizing as they came on the field.  Cement was mixed with tobramycin.  We  then, with a cement gun. placed cement on the tibial component up to,  but not into,  the flutes.  We then cemented on the tibial component,  impacted it and removed excess cement.  We then placed the rotating  platform.  We then cemented on the femoral component, impacted it and  removed excess cement.  We held the knee in full extension and removed  excess cement.  When the cement had cured, we jet lavaged the knee  again.  I let the tourniquet down and bleeding points were cauterized.  We then placed 10 mg of FloSeal in the wound.  Hemovac drains were  placed in the medial and lateral gutter, and brought out through the  superolateral portal.  The wound was then closed in layers with #1  figure-of-eight Vicryl on the  retinaculum, with a running locking  oversew of #1 PDS.  Vicryl 0, 2-0 and 3-0 were then used, and 3-0  Monocryl was placed with Steri-Strips.  Hemovac was hooked up to  AutoVac.  A second gram of Ancef had been given when the tourniquet was  let down.  A bulky sterile compressive dressing was applied with knee  immobilizer; and the patient, having tolerated the procedure well, was  awakened and taken to recovery room in satisfactory condition.  To be  admitted for routine postoperative care, CPM and analgesia.   PRIMARY CARE PHYSICIAN:  Holley Bouche, M.D.           ______________________________  V. Charlesetta Shanks, M.D.     VEP/MEDQ  D:  03/14/2007  T:  03/14/2007  Job:  [id number]   cc:   Holley Bouche, M.D.  Fax: [phone]

## 2010-07-07 NOTE — Discharge Summary (Signed)
NAMEMILITZA, DEVERY            ACCOUNT NO.:  [id number]   MEDICAL RECORD NO.:  [id number]          PATIENT TYPE:  INP   LOCATION:  1619                         FACILITY:  [facility]   PHYSICIAN:  Deidre Ala, M.D.    DATE OF BIRTH:  [birth year]   DATE OF ADMISSION:  03/14/2007  DATE OF DISCHARGE:  03/17/2007                               DISCHARGE SUMMARY   FINAL DIAGNOSES:  1. Degenerative joint disease right knee, status post patellofemoral      placement with continued chronic pain.  2. Hypothyroid.  3. Anxiety.  4. Insomnia.  5. Hypercholesterolemia.  6. Tachycardia.   PROCEDURE:  March 14, 2007, revision of patellofemoral placement to a  total knee arthroplasty.  Surgeon:  Dr. Renae Fickle.   HISTORY:  This is a 45 year old Caucasian female who has a past history  of a patellofemoral replacement for knee pain.  She had degenerative  changes.  She continued after the surgery to have continuing pain,  became chronic pain, and started worsening.  She was seen by Dr. Renae Fickle.  We treated her medically.  She has had injections.  She has had physical  therapy and these have since failed to help reduce the pain.  Subsequent  she is ready for a total knee replacement.  On March 14, 2007, the patient was admitted to [facility]  where she underwent revision of a patellofemoral replacement to a total  knee arthroplasty.  The patient tolerated the procedure well.  No  intraoperative complications occurred.  Postoperatively, the patient had  an Autovac on and she put out 300 of blood and 200 mL was returned to  her.  Other than that she required no other blood products.  Postoperatively no other untoward events occurred during her stay.  Her  pain was controlled.  She worked with physical therapy.  She had a CPM  machine for continued manipulation of her knee.  She progressed in a  satisfactory manner.  Overall, she did well and by March 17, 2007 she  was doing well and she was  ready for discharge at this time.   At the time of discharge her medications were:  1. Levothyroxine 175 mcg p.o. daily.  2. Simvastatin 20 mg daily.  3. Fibercon 1-2 p.o. daily.  4. Lovenox 30 mg subcutaneous q.12 h. for 10 days after discharge.  5. Mepergan fortis 1-2 p.o. q.4-6 h. p.r.n. pain, 40 of these and no      refills.  6. Baclofen 10 mg one or two p.o. t.i.d. p.r.n. for muscle spasm.  7. Ambien 10 mg p.o. h.s. for sleep.  8. Phenergan 25 mg p.o. q.6 h. p.r.n.  9. Benadryl p.r.n.  10.Advair p.r.n.  11.Premarin 1.25 mg daily.   The patient does have allergies to:  1. LATEX.  2. MORPHINE.  3. CODEINE.  4. BUPIVACAINE.  5. OXYCODONE.  6. CHLORHEXIDINE.   The patient is doing well.  She had drains in her knee which were  removed on the second postoperative day.  She did well.  The incision  was healing satisfactory at the time of discharge.  There was no sign of  any infection, no drainage noted from the knee.  The swelling was  minimal.  No sign of effusion.  Peripheral pulses intact.  Neuro was  grossly intact.  The patient was subsequently discharged home on March 17, 2007 in satisfactory and stable condition.  She did have anemia.  Her hemoglobin was actually 8.8 and her hematocrit  24.8 on January 23rd.  She was ready for discharge though at this time  and was subsequent discharged home in satisfactory and stable condition.   She will follow up with Dr. Renae Fickle in 10 days and I will ask her to follow  up for a repeat of her hemoglobin/hematocrit in the next few days with  her primary physician.   At this time she is discharged.      Phineas Semen, P.A.    ______________________________  Seth Bake. Charlesetta Shanks, M.D.    CL/MEDQ  D:  03/17/2007  T:  03/17/2007  Job:  [id number]

## 2010-07-07 NOTE — Op Note (Signed)
NAMECARLETHA, Kari Green            ACCOUNT NO.:  [id number]   MEDICAL RECORD NO.:  [id number]          PATIENT TYPE:  AMB   LOCATION:  NESC                         FACILITY:  [facility]   PHYSICIAN:  Maretta Bees. Vonita Moss, M.D.DATE OF BIRTH:  30-Nov-1965   DATE OF PROCEDURE:  03/08/2008  DATE OF DISCHARGE:                               OPERATIVE REPORT   Surgeon Dr Larey Dresser   ASSISTANT:  Dr.  Allena Katz.   PREOPERATIVE DIAGNOSIS:  Gross hematuria.   POSTOPERATIVE DIAGNOSIS:  Gross hematuria.   INDICATIONS:  This is a 45 year old female with a history of  hypothyroidism, anxiety disorder, as well as fibromyalgia who has seen  Dr. Vonita Moss for evaluation of gross hematuria.  She also has a history  of interstitial cystitis in the past.  A CT with and without contrast  was done in the clinic and showed no evident cause of her hematuria.  Furthermore, a urine culture done by her primary care physician was  negative.  She is here today for cystoscopic evaluation with possible  biopsies.  The risks and benefits of the procedure were outlined to the  patient preoperatively.   FINDINGS:  Cystoscopy and cold cup bladder biopsy   1. Petechial hemorrhages seen throughout the bladder in a high density      after bladder filling.  2. Normal clear urine efflux from the patient's ureters on each side.  3. Otherwise normal bladder and urethra.   PROCEDURE IN DETAIL:  The patient was brought back to the operating  room.  After the successful induction of LMA anesthetic, she was placed  in the dorsal lithotomy position and all pressure points were padded  appropriately and SCDs were placed.  A preoperative time-out was  performed and she received preoperative antibiotics.   Using a 22-French sheath with 30 and 70-degree lenses, pan  cystourethroscopy was performed.  The patient's urethra appeared normal.  The patient's bladder initially appeared normal.  Both ureteral orifices  were seen at the  lateral aspects of the trigone effluxing clear urine.  There were no bladder wall anomalies, no tumors, stones, foreign bodies  seen.   After the filling, it became evident that the patient had multiple  petechiae at high densities throughout the bladder.  This may be  consistent with interstitial cystitis.  Bladder biopsies were taken from  the posterior, left, and right lateral walls.  They were taken in the  areas of petechiae.  No other anomalies were seen.  The biopsy sites  were fulgurated.  The bladder was drained and the procedure was ended.  Please note Dr. Vonita Moss was present throughout the entirety of the case  and was the responsible surgeon.   ESTIMATED BLOOD LOSS:  Minimal.   URINE OUTPUT:  Unrecorded.   DRAINS:  None.   SPECIMENS:  Random bladder biopsies.   DISPOSITION:  The patient will go to the PACU for further care.      Delman Kitten, MD      Maretta Bees. Vonita Moss, M.D.  Electronically Signed    DW/MEDQ  D:  03/08/2008  T:  03/08/2008  Job:  [id number]

## 2010-07-10 NOTE — Op Note (Signed)
[place]  Patient:    Kari Green, Kari Green                   MRN: [id number] Proc. Date: 08/16/00 Adm. Date:  [id number] Attending:  Marcene Corning                           Operative Report  PREOPERATIVE DIAGNOSIS:  Right knee chondromalacia patella.  POSTOPERATIVE DIAGNOSIS: 1. Right knee chondromalacia patella. 2. Right knee plica.  OPERATION PERFORMED: 1. Right knee chondroplasty. 2. Right knee excision of plica.  ANESTHESIA:  General.  ATTENDING SURGEON:  Lubertha Basque. Jerl Santos, M.D.  ASSISTANT:  Lindwood Qua, P.A.  INDICATIONS FOR PROCEDURE:  The patient is a 45 year old woman who has had two knee arthroscopies in the past.  Unfortunately she has experienced recurrent pain over the last several months which may be related to an accident.  This has persisted despite oral anti-inflammatories and physical therapy.  She had an intra-articular injection as well as an injection in the pes area and none of this seemed to help.  She has a preoperative MRI, which did not show anything dramatic.  At this point with nothing else left to offer, we discussed the knee arthroscopy.  The procedure was discussed with the patient and informed operative consent was obtained after discussion of possible complications of reaction to anesthesia and infection.  DESCRIPTION OF PROCEDURE:  The patient was taken to an operating suite where general anesthetic was applied without difficulty.  She was then positioned supine and prepped and draped in normal sterile fashion.  After administration of preop intravenous antibiotics, an arthroscopy of the right knee was performed through two old portals.  The suprapatellar pouch was benign while the patellofemoral joint exhibited significant softening of the articular cartilage across much of the patella, especially along the medial patellar facet.  The patella itself did track well and no additional lateral  release was required.  A brief chondroplasty was performed.  The medial compartment was examined and the medial meniscus was completely benign.  The articular cartilage was also benign in this compartment.  She did have a small plica which was excised in the medial aspect of the knee.  In the lateral compartment, I saw no evidence of meniscal or articular cartilage injury.  The ACL and the PCL were intact. The knee was thoroughly irrigated at the end of the case followed by placement of Marcaine with epinephrine.  Adaptic was placed over the two portals and dry gauze and a loose Ace wrap were applied. Estimated blood loss and intraoperative fluids can be obtained from Anesthesia records.  DISPOSITION:  The patient was extubated in the operating room and taken to the recovery room in stable condition.  Plans were for her to go home the same day and to follow up in the office in less than a week.  I will contact her by phone tonight. DD:  08/16/00 TD:  08/16/00 Job: [id number]

## 2010-07-10 NOTE — Op Note (Signed)
NAME:  Kari Green, Kari Green                      ACCOUNT NO.:  [id number]   MEDICAL RECORD NO.:  [id number]                   PATIENT TYPE:  AMB   LOCATION:  DSC                                  FACILITY:  [facility]   PHYSICIAN:  Celene Kras, MD                     DATE OF BIRTH:  17-Feb-1966   DATE OF PROCEDURE:  05/08/2002  DATE OF DISCHARGE:  04/10/2002                                 OPERATIVE REPORT   The patient comes in for pain management today.  I evaluated her.  It has  been a number of weeks since I have seen her.  She has done fairly well, but  is starting to recrudesce with headache and her extensive fibromyalgia  symptoms.  In the interim, she has seen her primary care physician as I  recommended.  She has quit smoking.  She started walking regularly with her  daughter, and taking a proactive role in her own health care.  This is  important.  Her affect is bright today and she looks somewhat improved from  her previous presentations.  Problematic is the headache.  She describes it  as supraorbital, extending in a C2 cephalgic distribution, with  suprascapular and levator scapular amplification.  She has noted this  progressed over the past few days, and as I discussed issues such as  neurological impairment or the potential for decline in this regard, she  offers no particular clues that I would think there's any significant  neurological changes.  She appears to be fairly active and functional, just  this persistent headache, that she states starting more on the left side in  her neck and she describes a myofascial component.  There seems to be  flaring in her fibro in a general sense.   I really do not want to do another lidocaine infusion.  I think they help,  but they don't seem to have a lasting effect and they are a fairly involved  issue.  Perhaps someday as a rescue, but at this time I think we can just go  ahead with an occipital nerve block, and just see how she  does.  I also told  her that I want her to see Dr. Isabell Jarvis, a neurologist, as she has not head  her headaches worked up and I think this is a very important issue.  She has  had previous MRIs of her brain.  She is nonfocal, but I think from a  perspective of truly understanding her headaches,  Dr. Loleta Chance will help [place].  She agrees to this.  I have asked her to maintain contact with her primary  care.   These headaches do not seem to have any temporal relationship to her period,  and I do not think it is a hormonal issue, I think it is probably related to  the fibro.   Objectively, diffuse subparacervical, suprascapular myofascial  discomfort.  Suboccipital compression positive.  Positive cervical __________  compression test right greater than left.  Suprascapular, levator scapular,  myofascial amplification, paralumbar region.  She has no new neurological  findings.  Motor, sensory, reflex and pain with extension and lateral  rotational capacity.   IMPRESSION:  1. Degenerative spine disease, cervical spine.  2. Degenerative spine disease, lumbar spine.  3. Myofascial pain syndrome.  Fibromyalgic, doubt classic fibromyalgia.  4. Headache as secondary event.   PLAN:  Occipital nerve block.  She has appropriately consented.  .   DESCRIPTION OF PROCEDURE:  The skin was prepped.  Using a 25 gauge needle, I  advanced to the suboccipital groove without CSF, heme or paresthesia.  I  injected 2 ml of lidocaine 1% NPF in 20 mg of Aristocort to each side.  This  was an independent needle access point, right and left side.   She has appropriately recovered.  Continues on her current medication  regimen with the exception of Neurontin which we will start weaning away  from, as well as Flexeril to h.s. that she takes t.i.d.  She will continue  on Topamax.  Ambien is discussed.  She only uses pain medicine as a rescue.  Continue with home based therapy.  Lifestyle enhancements discussed.   Discharge instructions given.  Referred to Dr. Loleta Chance.                                               Celene Kras, MD    HH/MEDQ  D:  05/08/2002  T:  05/08/2002  Job:  [id number]   cc:   Kari Green. Loleta Chance, M.D.  [address]  [place]  [place] [address]  Fax: [phone]

## 2010-07-10 NOTE — H&P (Signed)
[facility]  Patient:    Kari Green, Kari Green                     MRN: [id number] Adm. Date:  04/18/00 Attending:  Zenaida Niece, M.D.                         History and Physical  CHIEF COMPLAINT:  Pelvic pain and dyspareunia.  HISTORY OF PRESENT ILLNESS:  This is a 45 year old white female, para 1-0-0-1, who underwent an LAVH after a prior BSO in February of 1999 for chronic pelvic pain.  She has been on hormone replacement therapy with Premarin 1.25 mg a day, and had been doing well.  However, she now complains of deep dyspareunia on the left greater than the right.  She also claims that her husband can feel something "grating" on the left side.  This occurred with intercourse approximately six weeks ago.  She also at that time felt like something ruptured and has had fairly constant pelvic pain since then.  She complains of decreased lubrication with intercourse and has no urinary symptoms and normal bowel movements.  She claims that she also has some upper abdominal pain and abdominal bloating.  She says that if anybody pushes on her abdomen, she will shortly afterwards vomit.  This includes if her new husband is on top of her having intercourse that she will vomit afterwards.  We have reviewed that these symptoms are more consistent with a GI etiology.  She has seen both a urologist because her bladder is tender and the urologist could not feel she had interstitial cystitis.  She is also in the process of seeing a GI doctor, who at this point cannot explain her pain.  The patient desires laparoscopy to rule out recurrent endometriosis which she has had in the past.  PAST OB HISTORY:  One vaginal delivery at term without complications.  PAST SURGICAL HISTORY:  Tonsillectomy and adenoidectomy in 1989, appendectomy in 1991, skin surgery in 1990 and 1991, laparoscopic BSO and fulguration of endometriosis on lysis of adhesions in 1998 followed by an  LAVH in February of 1999.  Thyroidectomy.  MEDICAL HISTORY:  Hypothyroidism for which she takes Synthroid.  ALLERGIES:  LATEX, MONISTAT, and TYLENOL #3.  SOCIAL HISTORY:  Patient smokes half a pack of cigarettes a day, denies alcohol use and is recently married.  FAMILY HISTORY:  Patient has a poor recollection but does state there is possibly several sisters and her mother with ovarian cancer.  PHYSICAL EXAMINATION:  GENERAL:  Generally she is a well-developed, well-nourished white female who is in mild distress from her discomfort.  HEENT:  Pupils are equally round and reactive to light and accommodation and her extraocular muscles are intact.  Oropharynx is clear without erythema or exudates.  NECK:  Supple without lymphadenopathy or thyromegaly and she has a well healed transverse scar.  LUNGS:  Clear to auscultation.  HEART:  Regular rate and rhythm without murmur.  ABDOMEN:  Soft with mild upper abdominal discomfort and mild left lower quadrant discomfort without palpable masses and again she is nondistended.  EXTREMITIES:  Extremities have no edema and are nontender.  DTRs are 2/4 and symmetric.  PELVIC:  External genitalia are without lesions.  On speculum exam there are no lesions and the vagina is well healed.  On bimanual exam she is diffusely tender, more so anteriorly with a slightly inflamed urethra and a tender bladder.  She  also has increased tone in the pelvic diaphragm.  Pelvic ultrasound in the office revealed no masses and again, she is diffusely tender vaginally.  ASSESSMENT:  Dyspareunia and pelvic pain as well as abdominal pain and symptoms with a history of endometriosis.  I doubt that this is coming from endometriosis as her symptoms are more consistent with a GI etiology and she has been on hormone replacement therapy only.  She has been treated with floxin and Zithromax for possible urethritis and has seen a urologist and a GI doctor.  Patient  desires laparoscopy to rule out endometriosis.  PLAN:  Admit the patient on an outpatient basis for diagnostic laparoscopy with possible fulguration of endometriosis and possible lysis of adhesions. Risks of surgery including bleeding, infection, and damage to surrounding organs, especially bowel, have been discussed with the patient and she agrees to proceed. DD:  04/19/00 TD:  04/19/00 Job: [id number]

## 2010-07-10 NOTE — Discharge Summary (Signed)
NAME:  Kari Green, Kari Green                      ACCOUNT NO.:  [id number]   MEDICAL RECORD NO.:  [id number]                   PATIENT TYPE:  INP   LOCATION:  0350                                 FACILITY:  [facility]   PHYSICIAN:  Leonia Reeves, MD                 DATE OF BIRTH:  29-Oct-1965   DATE OF ADMISSION:  02/11/2003  DATE OF DISCHARGE:  02/15/2003                                 DISCHARGE SUMMARY   DISCHARGE DIAGNOSES:  1. Acute abdominal pain with nausea and vomiting probably secondary to     ileus, may also be functional.  2. Ileus probably secondary to motility disorder versus adhesion.  3. Anxiety with multiple vague medical problems.  4. Noncompliance.   DISCHARGE MEDICATIONS:  The patient's home medications plus:  1. Zelnorm 6 mg twice daily before meals.  2. Milk of Magnesia p.r.n. for constipation.   SUMMARY:  Kari Green is a 45 year old white female who was sent from her  primary care physician's office to emergency room for abdominal pain,  nausea, and vomiting.  The patient stated that she has had nausea and  vomiting on and off for over one week and she has also had a poor appetite.  She reported abdominal pain mainly located at the epigastric region  radiating to her back.  She relates the pain to that which she felt when she  had cholecystitis.  She is status post cholecystectomy.  She also has  history of recurrent abdominal pain and vomiting in the past and apparently  had EGD by John C. Madilyn Fireman, M.D. which showed hiatal hernia by the patient's  history.   PAST MEDICAL HISTORY:  Includes fibromyalgia, chronic migraine,  thyroidectomy, recurrent abdominal pain and vomiting, status post  cholecystectomy, recent decompression of the left ulnar nerve by Cindee Salt,  M.D.   PHYSICAL EXAMINATION:  VITAL SIGNS:  Revealed a blood pressure of 110/81,  pulse 72, respirations 16.  Oxygen saturation 100% on room air.  ABDOMEN:  Moderate to severe epigastric tenderness  with some guarding.  Bowel sounds were essentially normal.  LUNGS:  Clear to auscultation and percussion.  CARDIOVASCULAR:  Regular rate and rhythm without murmur or gallop.   The rest of the physical examination was not remarkable.   LABORATORY DATA:  Sodium 138, potassium 3.9, chloride 103, bicarb 26,  glucose 110, BUN 10, creatinine 0.9, total bilirubin 1.2, alkaline  phosphatase 60, AST 31, ALT 20, albumin 4.8, calcium 9.7, total protein 7.7,  amylase slightly elevated at 145 (131 is the upper limits of normal).  Lipase is normal 41.  CT abdomen shows no __________for pancreatitis, pelvis  was unremarkable.  A repeat 2-view abdomen x-ray showed gaseous distention  of bowel, predominately colon, most likely ileus.   HOSPITAL COURSE:  The patient was admitted in the medical floor and started  on NPO.  She was also started on IV rehydration with normal saline plus  potassium chloride.  IV Dilaudid for pain control and also IV Phenergan for  nausea, vomiting, and itching.  She was given IV Protonix and her home  medications were added as needed.   CONSULTATIONS:  GI consult was done with Althea Grimmer. Santogade, M.D. who  evaluated the patient and also did EGD, the report essentially normal study  with no hiatal hernia, esophagitis, also no sign of obstruction.  Symptoms  were deemed to be likely functional.  Full report of the EGD in the  patient's medical record.  The report was discussed with the patient and  family members in detail both by myself and Dr. Luther Parody.  However, the  patient continued to complain of belly swelling and bloating.  She also  complained of constipation and impaction.  Fleet enema was tried with  minimal relief of the patient's symptoms.  The patient and family members  requested for a gastrointestinal consult reevaluation which was done.  Dr.  Luther Parody reevaluated the patient and discussed in detail GI symptoms,  findings, and the regimen of management with  the patient and family members.  The patient's symptoms to have improved with detailed discussion about her  problems.   CONDITION ON DISCHARGE:  Stable.  The patient is discharged in a stable  condition and she is scheduled for further GI studies (SBFT) on outpatient  basis.  The patient was also instructed to continue with the medications  prescribed by Althea Grimmer. Luther Parody, M.D.                                               Leonia Reeves, MD    VO/MEDQ  D:  02/15/2003  T:  02/15/2003  Job:  [id number]

## 2010-07-10 NOTE — Op Note (Signed)
NAME:  Kari Green, Kari Green                      ACCOUNT NO.:  [id number]   MEDICAL RECORD NO.:  [id number]                   PATIENT TYPE:  AMB   LOCATION:  DSC                                  FACILITY:  [facility]   PHYSICIAN:  Cindee Salt, M.D.                    DATE OF BIRTH:  02/28/1965   DATE OF PROCEDURE:  10/03/2002  DATE OF DISCHARGE:                                 OPERATIVE REPORT   PREOPERATIVE DIAGNOSIS:  Arthritis, metacarpophalangeal joint, right thumb.   POSTOPERATIVE DIAGNOSIS:  Arthritis, metacarpophalangeal joint, right thumb.   OPERATION PERFORMED:  Fusion, metacarpophalangeal joint, using Acutrak  screw, right thumb.   SURGEON:  Cindee Salt, M.D.   ASSISTANTCarolyne Fiscal.   ANESTHESIA:  General.   INDICATIONS FOR PROCEDURE:  The patient is a 45 year old female with a  history of injury to the metacarpophalangeal joint of the right thumb.  She  has not responded to conservative treatment.   DESCRIPTION OF PROCEDURE:  The patient was brought to the operating room  where a general anesthetic was carried out without difficulty.  She was  prepped using DuraPrep in supine position with the right arm freed.  The  limb was exsanguinated with an Esmarch bandage.  Tourniquet placed high on  the arm was inflated to .  A straight incision was made over the  metacarpophalangeal joint and carried down through the subcutaneous tissue.  Bleeders were electrocauterized.  The extensor tendon was split, the joint  opened.  Loss of cartilage was present on the dorsal surface with  significant narrowing of the remaining cartilage.  The cartilage subchondral  bone was then removed with a rongeur after incising the collateral ligaments  off from the metacarpal neck.  The drill holes were placed around the margin  of the proximal phalanx articular surface connected with an osteotome. The  cartilage and subchondral plate were removed from the proximal phalanx  proximally. The area  was then shaped using a rongeur to accept a cone shape  from the metacarpal head and the proximal phalanx base.  This fit well. A 35  K-wire was then inserted into the proximal midportion of the metacarpal  through the metacarpal head and into the proximal phalanx.  This revealed  the pin in good position in both AP and lateral direction.  The Acutrak  screw mini fragment set was set.  This was measured and found to be  approximately 26 mm after reaming it for a mini Acutrak screw.  The 24 mm  screw was then inserted fulling compressing after irrigation of the joint  and using bone graft to pack into any interstices.  Good coaptation in both  AP and lateral directions with good placement of the screw was noted.  The  wound was again irrigated.  The periosteum closed with figure-of-eight 4-0  Vicryl.  The extensor tendon with figure-of-eight 4-0 Mersilene sutures and  the  skin with interrupted 5-0 nylon sutures.  Sterile compressive dressing  and splint was applied.  The patient tolerated the procedure well and was  taken to the recovery room for observation in satisfactory condition.  She  is discharged to home to return to the [facility] in one week  on Talwin NX and Keflex.                                               Cindee Salt, M.D.   GK/MEDQ  D:  10/03/2002  T:  10/03/2002  Job:  [id number]

## 2010-07-10 NOTE — Consult Note (Signed)
NAME:  Kari Green, Kari Green                      ACCOUNT NO.:  [id number]   MEDICAL RECORD NO.:  [id number]                   PATIENT TYPE:  REC   LOCATION:  TPC                                  FACILITY:  [facility]   PHYSICIAN:  Hans C. Stevphen Rochester, M.D.                DATE OF BIRTH:  [birth year]   DATE OF CONSULTATION:  01/02/2002  DATE OF DISCHARGE:                                   CONSULTATION   REASON FOR CONSULTATION:  [facility].  She has come to bear.   She was a no show in July, and I counseled.  Apparently, [facility], and they have given her quite a few Dose Packs,  and injected steroid to her scalp in trigger point distribution.  This is  only a temporary relief cycling for her headaches, and she is complaining of  significant suprascapular and parathoracic myofascial amplification.  She  also extends to the lumbar region, and is describing escalation of  fibromyalgia symptoms.  Her headaches are more frequent, she has no  neurological changes, and she states that she is concerned about the  injections she is receiving.   I do not believe further imaging or diagnostics are warranted.  I do not  think further consultants are warranted.  She is relating symptoms that are  consistent with previous evaluations.  There has not been a lot of changes  in the interim with the exception of increased stressors as she is  apparently going back to court with custody issues, and financial issues are  weighing heavy.  This clearly is contributory, but she states that she is  sleeping well, she is trying home based therapy, and doing what she can in  this regard.   I do not believe we want to escalate into narcotic arena, and as I discuss  her medications and review her with them, we are seeing her most promising  approach to dealing with the pain with the Lidocaine infusion.  This has  been really only the central relief cycling she has had, and if we look at  the mechanisms of her pain control, we would rather treat her inside out as  opposed to outside in.  I strongly cautioned her to the potential over  exposure to steroids, and we will be mindful of this.  If her headaches do  escalate, I might a C2 nerve root injection or facetal injections, something  along these lines, as neurotomy would be an option to minimize further  injections.  Overall, I just want to move away from an interventional  approach after lidocaine infusion, and emphasize lifestyle enhancements and  home based therapy.  Over time, this is going to be our best approach.   PHYSICAL EXAMINATION:  MUSCULOSKELETAL:  She has diffuse suprascapular,  paracervical, and paralumbar myofascial discomfort,  impaired flexion,  extension of the cervical lumbar spine with positive cervical facetal  compression test, left greater then right.  Suboccipital compression test  positive.  She has no new neurological findings, motor, sensory, reflex.   IMPRESSION:  1. Myofascial pain syndrome.  2. Fibromyalgic presentation.  3. Cervicogenic headaches.  4. Chronic daily headache.   PLAN:  1. [facility].  2. As outlined above, lidocaine infusion will be contemplated.  3.     Lifestyle enhancements with home based therapy encouraged.  She has quit     smoking, and this is a good outcome predicted development.  Continuity     discussed.  Maintain contact with primary care as well.   Discharge instructions are understood by the patient.  No barriers.  Chaperoned environment.                                                 Hans C. Stevphen Rochester, M.D.    [facility]  D:  01/02/2002  T:  01/02/2002  Job:  [id number]

## 2010-07-10 NOTE — Assessment & Plan Note (Signed)
DATE OF SERVICE:  March 24, 2004.   MEDICAL RECORD NUMBER:  [id number].   DATE OF BIRTH:  1965/12/09.   The patient returns to day after I last saw her for trigger point injection  to the bilateral upper trapezius, as well as temporalis muscle.  Did not  have any improvement in her pain symptoms.  She has a history of  fibromyalgia and diffuse pain.  More recently, she had a shingles outbreak  in her left costovertebral angle area.  The rash has gone away, but she has  continued pain in that area.  The patient has been seen by Dr. Stevphen Rochester in the  past.   No new medications per her report.  She had had some Mepergan that she  received from another physician in the past and uses this very  intermittently.  She is not sure whether it really helps that much.  She was  tried on Cymbalta through our clinic at 30 mg dose.  She does not feel like  it has helped a lot with depression or with pain.  Activity wise she is  independent with all of her self-care.  She sometimes a cane to ambulate  because of multiple knee surgeries causing knee pain.  She had a  unicompartmental patellofemoral joint replacement in the past.  The patient  walks in the mall.   PHYSICAL EXAMINATION:  Generally in no acute distress.  She does looked  tired.  Mood and affect are flat.   She has good range of motion in bilateral upper and lower extremities.  Her  skin shows no evidence of rash over the back area or the right hip area.  She has no allodynia over that area.  She has normal deep tendon reflexes in  bilateral upper and lower extremities, normal strength in bilateral upper  and lower extremities.  She does have some numbness in the index finger on  the left hand compared to the first dorsal space.  Side to side it is about  equal left and right, however, she has had right carpal tunnel surgery in  the past.  The left ulnar transposition scar is nontender.  She negative  Tinel's in the elbow area, both  at the groove and more laterally.  Her  Phalen's test is positive in the left index finger.   REVIEW OF SYSTEMS:  Positive for numbness in the hands and also for poor  sleep and headaches.   IMPRESSION:  1.  Fibromyalgia syndrome.  I think this overlies her whole picture,      however, she does have some more discrete pain problems as well.  2.  Post herpetic neuralgia.  As I discussed with the patient, I think we      have a better chance of impacting on this.  Would try Lidoderm patch and      increase her Cymbalta to 60 mg.  3.  Headaches.  [facility]      with multiple trigger point injections.  She has not tried any type of      upper cervical nerve root injections, occipital nerve injections, etc.,      or sphenopalatine blocks.  For this reason, I will refer her to Celene Kras, M.D.  Please note that she did have an occipital nerve block on      May 08, 2002.  4.  Knee pain, osteoarthritis.  Generally  functioning well despite this.   PLAN:  1.  As noted above, will increase Cymbalta to 60 mg.  2.  In terms of post herpetic neuralgia, use Lidoderm as noted.  3.  Consider thoracic epidural for the post herpetic neuralgia should the      medication treatments not be sufficient.  Other potential treatments      would be acupuncture, as well as a trial of other type of medication,      such as Lyrica or Neurontin.  Of note is that she has tried Neurontin      for her other problems in the past and did not feel like it helped her      in terms of her Fibromyalgia__________ type pain.   I will see her back in one month for followup.  She will see Dr. Stevphen Rochester.  I  do not think any botulinum toxin injection or trigger point injections would  be helpful at this time.      AEK/MedQ  D:  03/24/2004 10:20:10  T:  03/24/2004 11:06:27  Job #:  [id number]

## 2010-07-10 NOTE — Op Note (Signed)
NAME:  Kari Green, Kari Green                      ACCOUNT NO.:  [id number]   MEDICAL RECORD NO.:  [id number]                   PATIENT TYPE:  EMS   LOCATION:  ED                                   FACILITY:  [facility]   PHYSICIAN:  Celene Kras, MD                     DATE OF BIRTH:  [birth year]   DATE OF PROCEDURE:  02/06/2002  DATE OF DISCHARGE:  01/05/2002                                 OPERATIVE REPORT   The patient comes in for pain management today.  I evaluated her via health  and history form 14 point review of systems.   (1)  Kari Green has received some benefit from the lidocaine infusion, although  not significant or dramatic result.  I will go ahead and at her request  trial a second one, but I am going to hold off if she does not get a good  functional enhancement.  Specifically, we do think that we have had some  improvement as we are maintaining non narcotic medication alternatives, and  this is a very important step for [place].  Her functional enhancement seems to  be improved.  Her affect is brighter.  She looks better today, she has had a  hair done, her nails done, etc. and as we reviewed functional indices, I  think that this is where our improvement has been maintained.  She is still  having quite a bit of pain and somatosizes a lot of various complaints, and  complaints of fibromyalgic symptoms that are generalized.  Although I do  think she probably has a fibromyalgic picture, she does not have a classic  fibromyalgia per se, and I think that if we continue to reinforce lifestyle  enhancements and quality of life indices, we will continue to make progress.  She has quit smoking, taking more of a proactive role in her home based  therapy, and as long as she continues to move forward into this arena, I  think it is reasonable to continue therapy.   Objectively, she has diffuse superscapular, paracervical, parathoracic  myofascial discomfort, impaired flexion and extension, and  lateral facial  pain with positive cervical facetal compression test.  She has pain in the  levator scapular region, and she had some rotational pain.  She has no new  neurological findings, motor sensory reflexive.   IMPRESSION:  Cervicalgia, degenerative spine disease, cervical spine, plus  fibromyalgia.   PLAN:  1. Go ahead and maintain non narcotic medication alternatives for symptom     control and tramadol is prescribed.  2. Lidocaine infusion.  250 mg in a two hour period, she has consented.     Discussed this with her, she is n.p.o. and supervised.  Appropriately     monitored.  3. Follow-up in consultation only at about one and a half to two months.  I     don't think I want her to get  too reliant on interventional procedures,     or lidocaine infusions, but this has been the singular best approach to     an improvement profile, and she is proactive.  To this end we will     continue our directed care approach.  She has consented for today's     procedure.   DESCRIPTION OF PROCEDURE:  The patient was taken to the procedure area, and  appropriately monitored.  IV was started, monitors were in place, and she is  appropriately supervised.  Lidocaine infusion was initiated, at two hours we  infused 250 mg of lidocaine without event.  She was alert and oriented at  discharge.  No complications identified.  We will see her in follow-up.  One  and a half to two months.  Discharge instructions given.                                                Celene Kras, MD   HH/MEDQ  D:  02/06/2002  T:  02/07/2002  Job:  [id number]

## 2010-07-10 NOTE — Op Note (Signed)
[place]  Patient:    Kari Green, Kari Green Visit Number: [id number] MRN: [id number]          Service Type: DSU Location: [facility] Attending Physician:  Marcene Corning Dictated by:   Lubertha Basque. Jerl Santos, M.D. Proc. Date: 11/29/00 Admit Date:  11/29/2000                             Operative Report  PREOPERATIVE DIAGNOSIS:  Right knee chondromalacia patella.  POSTOPERATIVE DIAGNOSIS:  Right knee chondromalacia patella.  OPERATION PERFORMED: 1. Right knee arthroscopic chondroplasty. 2. Right knee arthroscopic lateral release. 3. Right knee open Fulkerson slide.  ANESTHESIA:  General.  ATTENDING SURGEON:  Lubertha Basque. Jerl Santos, M.D.  ASSISTANT:  Lindwood Qua, P.A.  INDICATIONS FOR PROCEDURE:  The patient is a 45 year old woman with a long history of knee pain.  This has been much worse since a accident.  She has had an arthroscopy since the accident which revealed some chondromalacia. Unfortunately, she did not get better.  She has persisted with pain on the anterior aspect of her knee despite an exercise program and bracing.  She is now offered repeat arthroscopy with a Fulkerson slide.  The procedure was discussed with the patient and informed operative consent was obtained after discussion of possible complications of reaction to anesthesia and infection.  DESCRIPTION OF PROCEDURE:  The patient was taken to an operating suite where general anesthetic was applied without difficulty.  She was then positioned supine and prepped and draped in normal sterile fashion.  After administration of preop intravenous antibiotics an arthroscopy of the right knee was performed through three old portals.  Suprapatellar pouch was benign while the patellofemoral joint did exhibit some traumatic changes, especially on the superomedial aspect.  This was grade 3.  The patella did track in a slightly lateral position.  She had some significant pressure on the  patellofemoral joint and it was very difficult to pass the scope through the patellofemoral joint to the lateral gutter.  A repeat lateral release was done through her old portal.  The scope was then removed.  The leg was elevated, exsanguinated, and tourniquet inflated about the thigh.  An anteromedial incision was made centered on the patellar tubercle.  Dissection was carried down to expose the patellar tendon insertion on the tubercle.  A lateral release was extended from the level of the tubercle up to the arthroscopic lateral release.  Once this was done, the patella was freely mobile and it could be taken in a medial direction.  A saw was then used to make a cut on the tibial tubercle.  This was done from medial to lateral tapering distally.  Care was taken to keep a 45 degree tilt on the saw blade as the cut was made so once the osteotomy was completed, the tibial tubercle could be taken in a medial and anterior direction.  An osteotome was used to elevate the tubercle once the cut had been completed.  A distal sleeve was left intact.  A tubercle was taken in a medial direction more than 1 cm and anteriorly approximately 8 or 9 mm.  Two small fragment partially threaded cancellous screws were used to stabilize the tubercle in its transposed position.  Fluoroscopy was used to confirm placement of hardware and I read these views myself.  The wound was then thoroughly irrigated followed by closure of subcutaneous tissues with 2-0 undyed Vicryl and skin with Vicryl.  Steri-Strips were applied.  The tourniquet was deflated prior to closure and the leg became pink and warm immediately.  Adaptic was placed over her wounds followed by dry gauze. Estimated blood loss and intraoperative fluids as well as accurate tourniquet can be obtained from Anesthesia records.  DISPOSITION:  The patient was extubated in the operating room and taken to the recovery room in stable condition.  Plans were  for her to stay overnight for pain control with probable discharged home in the morning. Dictated by:   Lubertha Basque Jerl Santos, M.D. Attending Physician:  Marcene Corning DD:  11/29/00 TD:  11/29/00 Job: [id number]

## 2010-07-10 NOTE — Procedures (Signed)
[facility]  Patient:    Kari Green, Kari Green Visit Number: [id number] MRN: [id number]          Service Type: END Location: ENDO Attending Physician:  Louie Bun Proc. Date: 10/26/00 Admit Date:  10/26/2000   CC:         Meredith Staggers, M.D.  Sanjeev K. Corliss Skains, M.D.   Procedure Report  PROCEDURE:  Esophagogastroduodenoscopy with esophageal dilatation.  INDICATION FOR PROCEDURE:  Solid food dysphagia which, according to the patient, has responded to esophageal dilatation in the past, although EGD that I performed earlier this year revealed no visible stricture or ring of the esophagus.  DESCRIPTION OF PROCEDURE:  The patient was placed in the left lateral decubitus position and placed on the pulse monitor with continuous low-flow oxygen delivered by nasal cannula.  She was sedated with 0.05 mg IV Benadryl, 70 mg IV Demerol and 8 mg IV Versed.  The Olympus video endoscope was advanced under direct vision into the oropharynx and esophagus.  The esophagus was straight and of normal caliber with the squamocolumnar line at 38 cm.  There was no visible hiatal hernia, ring, stricture, or other abnormality of the GE junction or the esophagus.  The stomach was entered, and a small amount of liquid secretions were suctioned from the fundus.  Retroflexed view of the cardia was unremarkable.  The fundus, body, antrum, and pylorus all appeared normal.  The duodenum was entered, and both the bulb and second portion were well inspected and appeared to be within normal limits.  A Savary guidewire was placed through the endoscope channel and the scope withdrawn.  A single 17 mm Savary dilator was passed to minimal resistance with a small amount of blood seen on withdrawal.  The dilator was removed together with the wire, and the patient returned to the recovery room in stable condition.  She tolerated the procedure well, abd there were no immediate  complications.  IMPRESSION:  Essentially normal endoscopy, status post empiric dilatation due to complaints of dysphagia. Attending Physician:  Louie Bun DD:  10/26/00 TD:  10/26/00 Job: [id number]

## 2010-07-10 NOTE — Op Note (Signed)
NAME:  Kari Green, Kari Green                      ACCOUNT NO.:  [id number]   MEDICAL RECORD NO.:  [id number]                   PATIENT TYPE:  INP   LOCATION:  2550                                 FACILITY:  [facility]   PHYSICIAN:  Deidre Ala, M.D.                 DATE OF BIRTH:  07/20/1965   DATE OF PROCEDURE:  09/10/2003  DATE OF DISCHARGE:                                 OPERATIVE REPORT   PREOPERATIVE DIAGNOSIS:  Unremitting right knee anterior pain,  chondromalacia, status post failed Fulkerson slide.   POSTOPERATIVE DIAGNOSIS:  Unremitting right knee anterior pain,  chondromalacia, status post failed Fulkerson slide.   OPERATION PERFORMED:  Right unicompartmental knee replacement,  patellofemoral joint, Depuy cemented.   SURGEON:  Bradley Ferris, M.D.   ASSISTANT:  Madilyn Fireman, P.A.-C.   ANESTHESIA:  General endotracheal.   CULTURES:  None.   DRAINS:  Two medium Hemovacs to self suction.   ESTIMATED BLOOD LOSS:  Less than 100 mL.   BLOOD REPLACED:  Without.   TOURNIQUET TIME:  40 minutes.   PATHOLOGIC FINDINGS AND HISTORY:  Kari Green is a 45 year old who has been  multiply operated on for right knee pain, chondromalacia and has had a  Fulkerson slide after a knee arthroscopy.  She continued to have anterior  knee pain that was level 9 causing falling and that was unremittant.  We  elected to proceed with patellofemoral replacement.  Her pain was primarily  going up and down stairs patellofemoral joint but she did have exquisite  pain at the medial joint line over a plica and at the medial epicondyle.  At  surgery she had a very prominent medial epicondyle that was rubbing on a  scar plica and we did a partial medial condylectomy with a saw and we also  did a plica excision sharply for a thick scar plica medially.  However, the  rest of the joint was not damaged enough to warrant total knee arthroplasty.  We were worried since she was hurting on the medial side.  The  menisci  looked good, the ACL looked good, there was a defect  in the medial patellar  facet.  We did size her to a small plus trochlear component on the femoral  side and a 32 mm patellar button.  We fit it in perfectly with nice  tracking.  We had cement one batch with 750 mg of Zinacef per batch.   DESCRIPTION OF PROCEDURE:  With adequate anesthesia obtained using  endotracheal technique, 1 g Ancef given IV prophylaxis, the patient was  placed in supine position.  The right lower extremity was prepped from the  toes to the tourniquet in standard fashion.  After standard prepping and  draping, Esmarch exsanguination was used.  The tourniquet was let up to 350  mmHg.  Median parapatellar skin incision was followed by median parapatellar  retinacular incision.  The incision was deepened sharply  with a knife and  hemostasis obtained using the Bovie electrocoagulator.  We then everted the  patella, excised the fat pad, assessed the joint.  I then palpated a very  prominent medial epicondyle and removed it sharply with a saw smoothing it  but leaving the MCL intact.  I then excised the medial plica.  I then sized  the trochlea to a small plus, put on the template, cut the initial cut out  of the cartilage with a curved osteotome, then smoothed with a bur until it  fit perfectly.  We then callipered the patella to a 21, cut it with a  cutting jig down to a 13, placed the three peg guide in place for the 32,  drilled them and then trialed the components. Good range of motion had  occurred as well as good tracking.  We then checked the components as then  came on the field for sizing, mixed cement with the Zinacef.  We then  cemented on the trochlear component, impacted it and removed the excess  cement.  We then cemented on the patellar component, impacted it and removed  excess cement.  When the cement had cured, we articulated the joint through  a range of motion.  The tourniquet was let  down, bleeding points were  cauterized.  Hemovac drains were placed in the medial lateral gutter and  brought out the superior lateral portal.  The wound was then closed in  layers with #1 Vicryl on the retinaculum, 0 and 2-0 Vicryl on the subcu and  3-0 Monocryl on the skin with Steri-Strips.  The patient had had a femoral  nerve block.  The patient having tolerated the procedure well was awakened  and taken to recovery room in satisfactory condition in a knee immobilizer  to be started on routine postoperative CPM.                                               Deidre Ala, M.D.    VEP/MEDQ  D:  09/10/2003  T:  09/10/2003  Job:  [id number]   cc:   Amada Jupiter T. Dreiling, M.D.  [address]  [place]  [place] [address]  Fax: [phone]

## 2010-07-10 NOTE — Procedures (Signed)
[name], Kari Green            ACCOUNT NO.:  [id number]   MEDICAL RECORD NO.:  [id number]          PATIENT TYPE:  REC   LOCATION:  TPC                          FACILITY:  [facility]   PHYSICIAN:  Erick Colace, M.D.DATE OF BIRTH:  February 04, 1966   DATE OF PROCEDURE:  12/26/2003  DATE OF DISCHARGE:                                 OPERATIVE REPORT   MEDICAL RECORD NUMBER:  [id number].   DATE OF BIRTH:  1965-10-09.   PROCEDURES:  Trigger point injections.   Informed consent was obtained after describing the risks and benefits of the  procedure to the patient.  She wishes to proceed.  Bilateral upper trapezius  marked and prepped with Betadine, three spots at each upper trapezius, and  then three spots in each temporalis muscle marked and prepped with Betadine.  A 27 gauge 5/8 inch needle was used to inject 0.5 mL of 1% lidocaine into  each site after negative drawback for blood.  I did the same for the  temporalis muscle.  Total lidocaine injected 6 mL 1%.   The patient noted latex allergy and latex gloves.  Gave no immediate  reaction seen.  The patient was given Benadryl 50 mg/mL x 1 mL IM right arm.  Tolerated that well.  Given instructions.  Should she experience any  breathing difficulties to proceed directly to ED.  Have given her Medrol  Dosepak to be filled if she has just some localized swelling around her  scalp region or around her upper trapezius region.  She is to call in  tomorrow and let us known in regards to the efficacy of the trigger point  injection, as well as any reaction she may be having allergically.   If she has positive effect from trigger point injection, then we will  proceed on to botulinum toxin injection.       AEK/MEDQ  D:  12/26/2003 17:12:44  T:  12/27/2003 13:07:05  Job:  [id number]

## 2010-07-10 NOTE — Op Note (Signed)
NAME:  Kari Green, Kari Green                      ACCOUNT NO.:  [id number]   MEDICAL RECORD NO.:  [id number]                   PATIENT TYPE:  EMS   LOCATION:  ED                                   FACILITY:  [facility]   PHYSICIAN:  Jewel Baize. Stevphen Rochester, M.D.                DATE OF BIRTH:  [birth year]   DATE OF PROCEDURE:  DATE OF DISCHARGE:  01/05/2002                                 OPERATIVE REPORT   INDICATIONS:  The patient comes to the center for pain management today to  evaluate her health and history form and 14-point review of systems.   She is accompanied by her husband.   1. The patient comes in for scheduled lidocaine infusion.  The rationale is     that this is one procedure that helped her improve functional quality of     life, decrease narcotic consumption, and improve the interactions with     family and husband.  2. I strongly emphasized non-narcotic medication alternatives.  The     rationale here is that we do not believe we want to move forward with     advancing interventional procedures or narcotic-based pain medication, as     I think we need to continue to place her in an enabling environment.  I     do not see her as disabled, I think she is young and vital, and the     lidocaine infusion as the least invasive and intrusive way to obtain a     decrease sensitization.  She has pain in the suprascapular through     paracervical and paralumbar myofascial regions, with pain and provocative     escalation in most movement parameters.  She does not have a significant     distractibility, and she is essentially Waddell's negative, and her pain     does provoke quite a bit of anxiety.  3. I am also aware that she has anxiety associated with her pain     presentation.  This is a bedfellow, and I do think if we can decrease her     pain to a significant degree, we can actually have her participate in     home-based therapy, advancing as tolerated to something along the lines  as [employer] or a gym, as this is our best outcome predictive elements.  She     has quit smoking as this is very encouraging, and she is getting some     control over the frequency, intensity, and duration of her headaches.   Objectively, she has diffuse suprascapular, paracervical, paralumbar, and  myofascial discomfort.  Not really distractible.  Suboccipital compression  test positive.  Pain with elevating her scapula and pain with extension.  She has no new neurological findings.  Motor and sensory reflexes appear  good, not really changed from previous examinations except the myofascial  component is increased.   IMPRESSION:  Fibromyalgia, degenerative  spinal disease, cervicolumbar spine,  cervicogenic headache, and myofascial pain syndrome.   PLAN:  1. Lidocaine infusion.  I plan low-dose, and consider a higher dose later if     warranted.  She understands the risks, complications, and options of this     procedure.  She is NPO.  2. I reviewed her medications.  I reviewed home-based therapy and overall     directed care approach.  She understands the risks of all medications she     is taking, and I cautioned as to Xanax habituating nature and review     Neurontin's indications and contraindications.  She accepts.  3. She has consented for today's procedure.   PROCEDURE IN DETAIL:  The IV is started.  Lidocaine is infused under  monitored and supervised conditions, 250 mg over 2 hours.  She tolerated  this procedure well.  She had no complications from our infusion, and  appropriate discharge instructions are given.  She will remain alert and  cognizant.  Her pain level dropped  approximately 50%, and she will assess within context of activities of daily  living.  We will see her in followup.  Discharge instructions given.  Will  consider a second and higher dose if warranted.  Reviewed with this her and  her husband.                                               Hans C. Stevphen Rochester,  M.D.    [facility]  D:  01/16/2002  T:  01/16/2002  Job:  [id number]

## 2010-07-10 NOTE — Op Note (Signed)
NAME:  Kari Green, Kari Green                      ACCOUNT NO.:  [id number]   MEDICAL RECORD NO.:  [id number]                   PATIENT TYPE:  INP   LOCATION:  0101                                 FACILITY:  [facility]   PHYSICIAN:  Sandria Bales. Ezzard Standing, M.D.               DATE OF BIRTH:  Nov 26, 1965   DATE OF PROCEDURE:  03/04/2002  DATE OF DISCHARGE:                                 OPERATIVE REPORT   PREOPERATIVE DIAGNOSIS:  Cholecystitis with cholelithiasis.   POSTOPERATIVE DIAGNOSIS:  Chronic cholecystitis with cholelithiasis.   PROCEDURE:  Laparoscopic cholecystectomy with intraoperative cholangiogram.   SURGEON:  Sandria Bales. Ezzard Standing, MD   FIRST ASSISTANT:  Ollen Gross. Carolynne Edouard, M.D.   ANESTHESIA:  General endotracheal.   ESTIMATED BLOOD LOSS:  Minimal.   INDICATIONS FOR PROCEDURE:  Kari Green is a 45 year old, white female,  who is a patient of Robert A. Nicholos Johns, M.D., who presented to the [facility]  Emergency Room about 1 a.m.  She had pain that began last evening in her  epigastric and right upper quadrant area.  She had bouts with this on and  off for several years and been through several evaluations, all being  negative.  However, this time she had an ultrasound which suggested a  contracted gallbladder with fluid around her gallbladder and sludge/stones  with the gallbladder, consistent with probable acute cholecystitis.  She did  have a normal white blood count and was afebrile.  On physical exam, she had  signs and symptoms consistent with possible gallbladder disease, and I  talked to her about proceeding with gallbladder surgery.  I discussed the  indications and potential complications.  The potential complications  included but not limited to infection, bleeding, bile duct injury, open  surgery.  She understands the plan for surgery, and mother was in the room,  and we will proceed with surgery at this time.   DESCRIPTION OF PROCEDURE:  The patient went to the operating room.   She  underwent a general endotracheal anesthetic.  She had 1 g of Ancef at the  initiation of the procedure.  Her abdomen was prepped with Betadine solution  and sterilely draped, and she had PAS stockings in place.  An infraumbilical  incision was made, going through an old infraumbilical scar into the  peritoneal cavity.  I put a 0 degree laparoscope through a 12 mm Hasson  trocar, and this was secured with a 0 Vicryl suture.  The abdomen was  explored.  The right and left lobes of the liver were unremarkable.  The  anterior wall of the stomach was unremarkable.  She had some scar tissue of  her right colon along a right colonic gutter.  This could have been  postsurgical or very well a condition she could have been born with.  I saw  no other mass, lesion, or nodule within her pelvis or abdomen.   I then placed three additional  trocars, a 10 mm Ethicon trocar in the  subxiphoid location, a 5 mm Ethicon trocar in the right mid subcostal  location, and a 5 mm Ethicon trocar in the right lateral subcostal location.  In entering through with the 10 mm subxiphoid trocar, I did scratch the  surface of the liver capsule, the medial aspect of the left lobe of the  liver.  I Bovied this and then placed some Surgicel over it.  That  controlled the bleeding.  It was very superficial.   I went in grabbed the gallbladder, rotated cephalad, taking down the  duodenum which was stuck along the wall of the gallbladder about halfway up.  This would be consistent with chronic cholecystitis.  I dissected out the  cystic artery which was triple Endo Clipped and divided.  I identified the  cystic duct and was similarly clipped and placed on the gallbladder side.   The intraoperative cholangiogram was obtained using cut off taut catheter  inserted through a 14 gauge Jelco catheter into the abdominal cavity.  The  cystic duct was then cut, the taut catheter inserted into the cystic duct  and the cystic duct  then secured with the clip applier.   Intraoperative cholangiogram using half-strength Hypaque solution and dark  fluoroscopy using about 6 cc of contrast showed free flow of contrast down  the cystic duct, into the common bile duct, into the duodenum, and it  refluxed up to the hepatic radicles.  There was no impingement, no mass, no  fluid deep within the common bile duct, and this was thought to be a normal  study.   The taut catheter was then removed.  The cystic duct was triply Endo Clipped  and divided.  The gallbladder was then sharply and bluntly dissected from  the gallbladder bed.  I used primarily hook Bovie coagulation.  There were  two other vessels that I clipped that went to the gallbladder.  Prior to  complete division of the gallbladder from the gallbladder bed, I visualized  the triangle of Calot.  I visualized the gallbladder bed.  There was no  bleeding there or bile leak.  The gallbladder was then divided, delivered  through the umbilicus, packaged and sent to pathology.   The abdomen was irrigated with about one and a half liter of saline.  Each  trocar site was evaluated on removal of the trocars.  The umbilical port was  closed with a 0 Vicryl suture.  The other ports, I just removed the trocars.  The skin at each site was closed with a 5-0 Monocryl suture, painted with  tincture of Benzoin and Steri-Strips.   The patient tolerated the procedure well and was transported to the recovery  room in good condition.  The sponge and needle count were correct at the end  of the case.                                               Sandria Bales. Ezzard Standing, M.D.    DHN/MEDQ  D:  03/04/2002  T:  03/04/2002  Job:  [id number]   cc:   Dr. Normajean Baxter. Jerl Santos, M.D.  [address]  [place]  [place] [address]  Fax: [phone]   Elana Alm. Nicholos Johns, M.D.  [address]  [place]  [place] [address]  Fax:  [phone]  

## 2010-07-10 NOTE — Op Note (Signed)
NAME:  Kari Green, Kari Green                      ACCOUNT NO.:  [id number]   MEDICAL RECORD NO.:  [id number]                   PATIENT TYPE:  AMB   LOCATION:  [facility]                                 FACILITY:  [facility]   PHYSICIAN:  Maretta Bees. Vonita Moss, M.D.             DATE OF BIRTH:  04/08/1965   DATE OF PROCEDURE:  DATE OF DISCHARGE:                                 OPERATIVE REPORT   PREOPERATIVE DIAGNOSIS:  Rule out urethral stricture or interstitial  cystitis.   POSTOPERATIVE DIAGNOSIS:  Rule out urethral stricture or interstitial  cystitis.   PROCEDURE:  Cystoscopy, urethral dilation, hydrodistention of bladder and  cold-cut bladder biopsy.   SURGEON:  Dr. Larey Dresser.   ANESTHESIA:  General.   INDICATIONS:  This 45 year old white female was seen by me a couple of years  ago, and there was a question as to whether she might have some interstitial  cystitis, and she returned to see me also earlier this month with a long  history of straining to avoid bladder pressure.  She said she had to be  catheterized once.  She complains about symptoms of dyspareunia.  In the  office, a urinalysis was negative, and catheterized residual urine was 20  cc.  In-and-out catheterizations seemed to indicate some risk of urethral  stricture.  Because of the intolerance of the catheter, she is brought to  the OR today for further evaluation for cystoscopy and urethral dilation  with consideration of diagnosis of interstitial cystitis.   PROCEDURE:  Patient was brought to the operating room and placed in the  lithotomy position.  External genitalia were prepped and draped in the usual  fashion.  She was dilated from 22 to 62 [place], and she had mild urethral  stenosis and stricturing, but it was not extremely tight; however, she did  have mild urethral bleeding with dilation to 30 [place].  She was then  cystoscoped, and the bladder was unremarkable.  Because of the milder degree  of urethral  stricture than I suspected, I did do hydrodistention to 800 cc.  In looking back in, then, she had scattered submucosal petechiae and  hemorrhage of the bladder in all four quadrants.  Three cold-cut bladder  biopsies were taken from the  bladder base to rule out interstitial cystitis.  Biopsy sites were  fulgurated with the Bugbee electrode.  The bladder was emptied.  The scope  removed.  The patient was sent to the recovery room in good condition,  having tolerated the procedure well.                                               Maretta Bees. Vonita Moss, M.D.    LJP/MEDQ  D:  04/10/2003  T:  04/10/2003  Job:  [id number]   cc:   Molly Maduro  A. Nicholos Johns, M.D.  [address]  [place]  [place] [address]  Fax: [phone]   Everardo All. Madilyn Fireman, M.D.  [address]  [place]  [place] [address]  Fax: [phone]

## 2010-07-10 NOTE — Op Note (Signed)
[facility]  Patient:    Kari Green, Kari Green                   MRN: [id number] Proc. Date: 04/18/00 Adm. Date:  [id number] Attending:  Michaele Offer                           Operative Report  PREOPERATIVE DIAGNOSES:  Dyspareunia, abdominal and pelvic pain and abdominal bloating.  POSTOPERATIVE DIAGNOSES:  Dyspareunia, pelvic pain and abdominal adhesions.  PROCEDURES PERFORMED:  Laparoscopic adhesiolysis and removal of a possible right ovarian remnant.  SURGEON:  Zenaida Niece, M.D.  ANESTHESIA:  General endotracheal.  ESTIMATED BLOOD LOSS:  Less than 50 cc.  INTRAOPERATIVE FINDINGS:  She has a normal well-healed pelvis without evidence of adhesions or endometriosis. There were a few petechiae at the vaginal cuff which may have been consistent with endometriosis. There were adhesions of the bowel bilaterally to the abdominal wall, worse on the right, greater than the left.  There was a streak of whitish tissue along the right pelvis which may have been consistent with some retained right ovary.  COUNTS:  Correct.  CONDITION ON DISCHARGE:  Stable.  DESCRIPTION OF PROCEDURE:  After appropriate informed consent was obtained, the patient was taken to the operating room and placed in the dorsosupine position. General anesthesia was induced and she was placed in mobile stirrups. Her abdomen was then prepped and draped in the usual sterile fashion for a laparoscopic procedure, her bladder drained with a catheter and a sponge stick placed in the vagina for manipulation of the vaginal cuff.  Her infraumbilical skin was then infiltrated with 0.25% Marcaine and a 1.5 cm horizontal incision was made in her previous scar. The Veress needle was inserted into the peritoneal cavity and placement confirmed by the water-drop test at an opening pressure of 0 mmHg. Two and a half liters of CO2 gas were insufflated and the Veress needle was removed.  The 11 mm disposable trocar was then introduced and placement confirmed by the laparoscope. Five millimeter ports were in the midline and left lower quadrant under direct visualization after the skin was anesthetized with Marcaine. Inspection revealed the above mentioned findings. The petechiae at the vaginal cuff were coagulated with bipolar cautery in case this was endometriosis. I do not feel that it was. Adhesions of the colon to the right abdominal wall were taken down with scissors to mobilize the colon and take care of the adhesions. This was done with adequate hemostasis and was able to free up that bowel. The tissue that appeared to be a remnant of the right ovary was grasped with a grasper and what may have been left of the infundibulopelvic ligament was coagulated with bipolar cautery. This tissue was then removed sharply with scissors. The round ligament was made hemostatic with bipolar cautery. All of these areas on the right side were then irrigated copiously and hemostasis was confirmed. This piece of tissue that was removed was removed from the abdomen and sent as a specimen for possible right ovarian remnant. The adhesions of the colon to the left pelvic brim were then taken down sharply to free up the colon; this was done with adequate mobilization. All areas were then again irrigated and found to be hemostatic. The pelvis was reinspected and there again, found to be no further source for her pelvic pain. The 5 mm ports were removed under direct visualization. It should be  noted that the liver edge, gallbladder and stomach all appeared normal. The laparoscope was then removed and all gas allowed to deflate from the abdomen. The 11 mm disposable trocar was removed and all skin incisions were closed with interrupted subcuticular sutures of 4-0 Vicryl, followed by Steri-Strips and band-aids. The patient tolerated the procedure well, was extubated in the operating room and  taken to the recovery room in stable condition.DD:  04/18/00 TD:  04/18/00 Job: [id number]

## 2010-07-10 NOTE — Discharge Summary (Signed)
[facility]  Patient:    Kari Green, Kari Green                   MRN: [id number] Adm. Date:  [id number] Disc. Date: [id number] Attending:  Luis Abed CC:         Elana Alm Eliezer Lofts., M.D.   Discharge Summary  HISTORY OF PRESENT ILLNESS:  This 45 year old female came to [facility] Emergency Room at the request of her endodontist, Dr. Colleen Can, with extensive swelling of the left cheek, left infraorbital area, and left temporal area, and complaining of severe pain in the area.  Prior to her admission she had undergone root canal treatment on teeth numbers 15, 14, and 20 the previous week and had developed the facial swelling and was started on antibiotics, Cleocin 150 mg four times daily.  That was advanced to Cleocin 300 mg four times daily the day before admission, and undergone an intraoral incision and drainage for possible abscess formation and a vinyl drain was placed infraorally.  The patient was referred to me after coming to the emergency room for hospital admission and workup for left maxillary facial abscess and possible surgical drainage.  PAST MEDICAL HISTORY:  Complicated by history of thyroidectomy and was maintained on Synthroid 0.3 mg twice daily.  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  Possible history of lupus.  Possible history of rheumatoid arthritis.  She had undergone hysterectomy several years ago.  In the emergency room, the patient received Cleocin 600 mg intravenously and was also given 2 mg of Dilaudid.  She had a mild allergic reaction to the Dilaudid, with itching and erythema and generalized rash and was given 25 mg of Benadryl IV, and the rash subsided.  HOSPITAL COURSE:  In the emergency room also a CT scan of the maxilla and Panorex x-ray of the maxilla showed that the root canals were completed in the standard manner.  No evidence of radiolucent lesions at the apex.   The maxillary sinuses were clear.  The CT scan showed extensive facial swelling of the left infraorbital area, left temporal area, and left maxillary area. There was no evidence of abscess formation, and the edema was generalized. The maxillary sinus was clear and showed no evidence of infection within the left maxillary sinus.  The patient was admitted for intravenous antibiotics and monitoring for abscess formation and possible surgical drainage and support care.  The next day, on Jul 07, 2000, infectious disease consult with Dr. Ponciano Ort.  Dr. Roxan Hockey felt that swelling and edema of the left face was not consistent with a cellulitis infection and, indeed, she remained afebrile. She showed no white cell count shift or increase.  Did show an increase in eosinophils in the white cell count.  Dr. Roxan Hockey felt that the most likely diagnosis was angioedema and urticaria, and the patient was started on Benadryl 25 mg four times daily and Pepcid 150 mg once a day.  The patient continued to have significant swelling of the left face, the left temporal area, with slight improvement.  Dermatology consult was received from Dr. Donzetta Starch, and Dr. Yetta Barre agreed with the diagnosis of angioedema and urticaria and suggested changing to doxepin 50 mg h.s. and Atarax 25 mg every six hours and discontinuing the Benadryl and Pepcid.  She continued to show more improvement, although continued to complain of difficulty swallowing and difficulty taking food.  However, at the same time, she seemed to take food and  take fluids well.  She was monitored for another day because of the neck swelling and possible airway impingement.  She has shown a decrease of the neck swelling and a propensity for airway obstruction.  She is taking fluids and food well now, although she continues to have mild swelling of the left maxilla, left submandibular area, and bilateral neck areas.  The swelling is soft and shows no  evidence of infection, and she has remained afebrile throughout the course of her hospital stay.  A third consultation was received from Dr. Townsend Roger, an oromaxillofacial surgeon, and he also, after reviewing the data, felt that her swelling was secondary to angioedema and not from infection.  On Jul 08, 2000, I contacted her family physician, Dr. Elias Else, and discussed her diagnosis with him and to arrange follow-up care for her on discharge.  Dr. Nicholos Johns agreed to follow this patient and arrange further workup for her angioedema and follow her present treatment regime.  DISCHARGE INSTRUCTIONS:  The patient is instructed not to drive or operate machinery while on her present medications.  To continue Atarax at 25 mg four times daily and doxepin 50 mg at h.s.  She also complains of difficulty with bowel movement and was given Dulcolax suppository prior to discharge.  She is to continue her regular medications, which include Vioxx 25 mg once a day and Synthroid 0.3 mg twice daily and Premarin 1 tablet daily.  LABORATORY DATA:  On admission she also had blood cultures that have been proven to be negative.  Her admission white cell count was 67,000, RBC 4.28, hemoglobin 13.5, hematocrit 38.7.  Normal indices.  Differential was normal except for eosinophils that are increased to 8%.  Her prothrombin time, INR, and partial thromboplastin time were all within normal limits.  Serum electrolytes were within normal limits.  Blood chemistries were within normal limits.  CT scan final impression showed diffuse edematous changes without evidence of abscess formation.  Negative for osseous destructive changes.  FINAL DIAGNOSES: 1. 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 2. 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 3. Possible history of lupus. 4. Possible history of rheumatoid arthritis. 5. Status post hysterectomy.  FOLLOW-UP:  The patient  is referred to her family physician, Dr. Elias Else, and he will continue to follow her treatment and additional workup for angioedema.  DD:  07/09/00 TD:  07/11/00 Job: [id number]
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
## 2010-07-10 NOTE — Procedures (Signed)
[place]  Patient:    Kari Green, Kari Green              MRN: [id number] Proc. Date: 03/07/00 Adm. Date:  [id number] Disc. Date: [id number] Attending:  Louie Bun CC:         Meredith Staggers, M.D.                           Procedure Report  PROCEDURE:  Twenty-four hour esophageal pH study.  INDICATIONS FOR PROCEDURE:  Long history of epigastric and substernal burning, presumed gastroesophageal reflux, but with failure to respond to antipeptic medication.  RESULTS:  Channel 1:  The total DeMeester score is 6.2 with upper limits of normal 22.  There were essentially no symptoms reported during the study.  IMPRESSION:  Normal 24-hour pH study.  PLAN: 1. Consider other causes of her symptoms than gastroesophageal reflux. 2. Trial of Carafate until office follow-up. DD:  03/13/00 TD:  03/14/00 Job: [id number]

## 2010-07-10 NOTE — H&P (Signed)
[facility]  Patient:    Kari Green, Kari Green                   MRN: [id number] Adm. Date:  [id number] Disc. Date: [id number] Attending:  Luis Abed                         History and Physical  REASON FOR ADMISSION:  This 45 year old female was referred to the [facility] Emergency Room by her endodontist, ______ , after she continued to develop significant swelling of the left intraorbital area, left maxilla and left temple area that was not resolving with antibiotic treatment.  HISTORY OF PRESENT ILLNESS:  [age]-year-old female had root canal treatment for nonviable tooth #15 and tooth #14 approximately one week prior to admission.  After completing the root canal on tooth #14, several hours later she started developing swelling in the left maxilla, left infraorbital area and has extended into the left temple area.  Approximately five days prior to admission, she was started on clindamycin 150 mg four times daily and this was increased two days ago to 300 mg four times daily.  On Jul 04, 2000, she presented to ______ office and an incision of the drainage of the left maxillary vestibule and placement of a non-Latex Penrose drain was completed under local anesthesia.  She continued to develop increased swelling of the left maxillary area, left temple area, complained of severe headache and today, was referred to the [facility] Emergency Room for evaluation and workup.  I was called by ______ and by the emergency room to consult this patient and facilitate her hospital admission for management of left maxillary cellulitis and possible abscess in need of drainage.  In the emergency room, patient was started on clindamycin 600 mg every six hours and was given Dilaudid 2 mg in her IV and developed rash and itching that were then treated with Benadryl 25 mg IV.  After continued workup in the emergency room, a Panorex x-ray was  completed and CT scan of the maxilla showed excessive soft tissue swelling of the left infraorbital area and left temporal area.  There was no evidence of abscess formation, as the swelling was diffuse and no pockets of pus were identified. The left maxillary sinus was relatively clear and showed no thickening or active intrasinus infection.  Patient remained afebrile and was transported to the floor for additional intravenous antibiotic treatment and monitoring.  PAST MEDICAL HISTORY:  She is hypothyroid, status post thyroidectomy for a goiter, and is on Synthroid.  She also has a history of a possibility of lupus.  She has rheumatoid arthritis and receives Vioxx daily and had a hysterectomy for fibroid tumors.  CURRENT MEDICATIONS: 1. Vioxx 25 mg daily. 2. Synthroid 0.3 mg twice daily. 3. Premarin dosage unknown, one tablet daily.  SOCIAL HISTORY:  She is married, is a retired Academic librarian, smokes approximately one pack of cigarettes a day, drinks alcohol socially.  FAMILY HISTORY:  Not obtained.  PHYSICAL EXAMINATION:  VITAL SIGNS:  Blood pressure is 99/69.  Pulse is 83.  Respirations 16. Temperature is 98.4.  GENERAL APPEARANCE:  Well-developed, well-nourished 45 year old female who appears her stated age and complains of pain in her left face and obvious swelling of the left upper jaw and temple area.  HEENT:  Head:  Normocephalic.  Even distribution of reddish hair and tenderness of the left temple area and +1 swelling of the left temple  area. Eyes:  Moderate periorbital edema of the left eye and tenderness and swelling of the left infraorbital area that is unusually soft with an erythematous skin.  Pupils equal, round and reactive to light and accommodation. Extraocular movements are intact.  Funduscopic not done.  Ears:  Canals are clear.  Tympanic membranes intact.  Face:  There is +2 swelling of the left infraorbital area, left maxilla and left maxillary  space.  She has a moderate trismus with a limited opening of approximately 20 mm.  A vial drain is in the left maxillary vestibule and sutured into position.  There is no purulent drainage.  There is no swelling in the maxillary vestibule.  Her teeth are in good dental repair.  She has no tenderness of the maxillary teeth and root canal treatment and endodontic openings have been completed on teeth 14, 15 and 20 and have been closed with temporary restorations.  Floor of the mouth is clear.  There are no other active oral lesions.  NECK:  Supple without palpable nodes or thyroid.  Trachea is in the midline.  LUNGS:  Clear to percussion and auscultation.  HEART:  Regular sinus rhythm without murmurs or gallops.  ABDOMEN:  Normal bowel sounds, without tenderness, guarding or organomegaly.  EXTREMITIES:  Full range of motion, without cyanosis, clubbing or edema.  NEUROLOGIC:  Cranial nerves II-XII are grossly intact.  Sensory and motor functions are intact.  BREASTS:  Deferred.  RECTAL:  Deferred.  GENITAL:  Deferred.  SKIN:  Warm and dry.  There is an erythematous appearance of the skin over the left zygomatic arch and left maxillary area.  SPECIAL STUDIES:  CT scan of the maxilla shows moderate edema of the left face, no abscess formations and maxillary sinuses relatively clear and shows no active inflammation and no evidence of abscess.  Panorex x-ray shows root canal treatment has been completed on teeth #14, 15 and 20 and there are no active periapical lesions.  ______ reports that the periapical x-rays taken of teeth #14 and 15 showed thickening of the periodontal ligament but no periapical radiolucencies.  IMPRESSION: 1. Left maxillary and temple space cellulitis.  Recommendation:  Admission for    intravenous antibiotics, support therapy and infectious disease consult. 2. History of Latex allergy.  Plan is have the patient avoid Latex and use    vinyl gloves. 3.  Hypothyroid secondary to thyroidectomy for goiter, supported with Synthroid    0.3 mg twice daily. 4. History of lupus.  5. History of rheumatoid arthritis, supported with Vioxx 25 mg daily.  DISCUSSION:  We will admit mainly for bedrest, intravenous antibiotic support with Cleocin 600 mg four times daily, heat to the left face, maintain good oral hygiene and nutrition and monitor for abscess formation and possible intraoral or extraoral incision and drainage as needed. DD:  07/08/00 TD:  07/10/00 Job: [id number]

## 2010-07-10 NOTE — Discharge Summary (Signed)
NAME:  Kari Green, Kari Green                      ACCOUNT NO.:  [id number]   MEDICAL RECORD NO.:  [id number]                   PATIENT TYPE:  INP   LOCATION:  5030                                 FACILITY:  [facility]   PHYSICIAN:  Madilyn Fireman, P.A.-C.                 DATE OF BIRTH:  August 15, 1965   DATE OF ADMISSION:  09/10/2003  DATE OF DISCHARGE:  09/15/2003                                 DISCHARGE SUMMARY   ADMISSION DIAGNOSIS:  Intermittent right knee anterior pain, chondromalacia  post failed Fulkerson slide.   DISCHARGE DIAGNOSIS:  Intermittent right knee anterior pain, chondromalacia  post failed Fulkerson slide status post right patella femoral arthroplasty.   SUMMARY:  The patient was brought to [facility] on September 10, 2003,  taken to the operating room for a right patellofemoral arthroplasty.  This  was performed under general anesthesia with a femoral nerve block with  tourniquet time of 40 minutes and estimated blood loss of less than 100 cc,  __________.  Patellofemoral submental replacement was used.  The wound was  closed over two medium Hemovac drains, and the patient was taken to the  recovery room in stable condition.  On postoperative day #1, September 11, 2003,  she was complaining of an appropriate level of right knee pain, pretty well  controlled by the PCA.  She was having difficulty getting comfortable.  Her  appetite was decreased, but no nausea complaints.  Her vitals were stable.  She was afebrile. The dressing was clean and dry.  There was approximately  125 cc of blood in the Hemovac drain and 200 cc of blood had been already  emptied out overnight.  Postoperative labs showed hemoglobin 10.9,  hematocrit 31.7, white count 9.3, platelets 180.  Chemistries normal except  for a low potassium of 3.4 and low calcium 8.3.  Plan was to continue with  CPM.  She as on Lovenox per protocol.  PT/OT and rehab were consulted.  She  was out of bed to chair, weightbearing as  tolerated.  Ambulation was  encouraged.  On postop day #2, September 12, 2003, she was complaining of  moderate to severe right knee pain, pain with motion, pain whenever she was  standing for more than 10-15 minutes.  Had not really been doing any  weightbearing yet.  Foley was discontinued.  She was voiding after about a 4-  5 hour delay from the first stay, otherwise, without difficulty.  Her  itching was well controlled and she was having nausea.  Vitals were stable.  She was afebrile.  The wound was benign.  There was some bleeding from the  inferior portion of the wound clotted off.  Calf was soft and nontender.  She was neurovascularly intact.  White count was 7.8, hemoglobin 9.3,  hematocrit 26.8, platelets 170.  Chemistries normal except for a low BUN of  3.  Dressing was changed.  Drains were discontinued.  She had a moderate  amount of bleeding from the drain site after these were pulled.  Her PCA was  discontinued.  She had Mepergan Fortis for pain and Dilaudid IV for  breakthrough.  Three-in-one and a walker were ordered for home DME as well  as home CPM and Lovenox set up for post discharge as well as Home Health PT  and Home Health OT.  On postop day #3, July 22, she was still having  significant right knee pain, a little better controlled.  She had some  itching-pruritus.  She was feeling tired and no shortness of breath.  No  coughing.  She was taking p.o. and voiding without difficulty.  Her vitals  were stable.  She was afebrile.  There was still some bleeding from the  inferior wounds and from the drain site.  Both were clotted on the  dressings.  She had apparent 2+ hemarthrosis with mild erythema and minimal  ecchymosis.  She was diffusely tender.  Calf was soft and nontender.  She  was neurovascularly intact.  White count 7.4, hemoglobin 7.9 down from 9.3,  hematocrit 23.0, platelets 172,000.  Chemistries:  Low sodium of 134, low  potassium 3.3, low BUN of 4, normal,  creatinine 0.8, low calcium 8.0.  Plan  was made to hold off on CPM and range of motion work.  She can still  continue ambulation.  Ice and elevation as needed.  We typed and crossed her  for two units and transfused two units of blood with 20 mg of Lasix IV in  between.  On postop day 4, July 23, she was still complaining of right knee  pain with some swelling.  She had some problems sleeping the night before.  She said she was feeling much better in the morning with much more energy.  Her vitals were stable.  She was afebrile.  She had a low potassium of 3.1,  low BUN of 4, glucose borderline elevated 100 and low calcium 8.3.  Hemoglobin was 11.3, hematocrit 32.1 and white count 8 with platelet count  of 177,000.  The wound was benign.  She had just one small area of inferior  wounds that still had some dry drainage, nothing active.  The remainder of  her dressing was clean.  Calf was soft and nontender.   Continue to hold off on motion and CPM.  Continue with ice downs and  elevations.  She could ambulate.  Go ahead with muscle relaxers and pain  medications as needed.  On the night of July 24, she states she fell while  getting up, was in a tight area between a bed and a cot.  Could not maneuver  the walker and fell.  Had no significant injuries or changes in her symptoms  or worsening of her new symptoms following.  On September 15, 2003, postoperative  day #5, she was still complaining of right knee pain.  She said that the  Demerol was not controlling her well, but the Dilaudid was doing well.  She  was having moderate amount of swelling that I felt was improving.  She felt  like she had a slight bit of fever in her knees.  She was able to get around  with the walker.  Felt she was ready to go home.  Vitals were stable.  Afebrile.  Maximum temperature 99.0.  The right knee had some mild erythema  and slight ecchymosis.  Incision was benign.  There was no active  drainage or bleeding.  She  was diffusely tender over the knee with a 1-2+  hemarthrosis.  She was tender along the medial hamstring up into the thigh  with pain on extension.  She had range of motion of the knee of 15 short of  full extension to 45 degrees, but pain at extremes with extension.  Calf was  soft and nontender.  Following the course of the vascular structures up into  the thighs, she was nontender as well.   PLAN:  Discontinue IV.  Discharge her home.  Activity is weightbearing as  tolerated, right leg with walker and immobilizer.  Going to hold off on the  CPM for two days.  If the swelling and pain start to improve, she is to  start back on the CPM 0-30 degrees 6 hours per day and increase by 5-10  degrees or as tolerated.  If she had increasing pain or swelling remained  constant, no improvement, she was to call for follow up appointment.  Instructions for the wound were to keep clean and dry.  She may take a  shower on Tuesday.  Daily dressing changes as needed.  Diet was as  tolerated.  Special instructions were to call for increasing pain, redness,  drainage or bleeding, numbness or tingling down the leg, cough, shortness of  breath, fever greater than 100.5 or chills.   DISCHARGE MEDICATIONS:  1. Synthroid 0.3 mg one p.o. daily.  2. We are having her hold off on her Flexeril.  3. Continue Ambien as needed.  4. New medications:     A. Dilaudid 4 mg 1-2 p.o. q.4h. p.r.n. pain, #50, no refills.     B. Zanaflex 4 mg 1 p.o. q.6-8h. p.r.n. spasm or cramps, #40, +1 refill.     C. Lovenox 40 mg one injection subcu daily.   FOLLOWUP:  If on Tuesday, July 26, she is not feeling that the swelling or  pain are getting better, she should call and make an appointment for  Wednesday morning, July 27.  Otherwise, we will have her keep an appointment  on Wednesday, August 3 for sutures.  Get x-rays at that time.  We will have  her call for an appointment.   CONDITION ON DISCHARGE:  Stable.                                                 Madilyn Fireman, P.A.-C.    AC/MEDQ  D:  09/15/2003  T:  09/16/2003  Job:  [id number]
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
## 2010-07-10 NOTE — Consult Note (Signed)
[facility]  Patient:    Kari Green Visit Number: [id number] MRN: [id number]          Service Type: PMG Location: [facility] Attending Physician:  Rolly Salter Dictated by:   Jewel Baize. Stevphen Rochester, M.D. Admit Date:  03/13/2001   CC:         Dr. Burnis Medin   Consultation Report  [facility] as a kind referral from Dr. Burnis Medin.  Kari Green is a very pleasant 45 year old who has had decline in functional indexes and quality of life indexes secondary to fibromyalgic symptoms.  Originally diagnosed a number of years ago, Kari Green has had progressive impairment of normal activities of daily living and functional lifestyle.  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  Kari Green relates that pain is all over, is burning, throbbing, aching, numbness, and constant in nature. Relates her pain is a 7/10 on a subjective scale, dull, aching, and throbbing, depressing her mood.  States no wish to harm self or others.  Kari Green has tried multiple therapies inclusive of physical therapy and multiple medication adjustments and nothing has seemed to help her.  Kari Green frequently has headaches and three elements of the 5/5 inclusive of irritable bowel, muscle pain, and headache.  MEDICATIONS:  Ambien, Flexeril, Neurontin, imipramine, Synthroid, Vioxx, Xanax, Demerol, Benadryl.  ALLERGIES:  LATEX, CODEINE, MORPHINE, DILAUDID, MARCAINE, VICODIN.  States no wish to harm self or others.  A 14 point review of systems, health and history form reviewed.  Recently been worked up for scleroderma stated not present.  Frequent petechiae.  Kari Green is seen by primary care.  Uneventful work-up as is her headache work-up.  Kari Green is seen at the headache clinic.  Kari Green has frequent irregular heartbeat and ankle swelling, but states no other issues in this regard.  PAST SURGICAL HISTORY:  Hysterectomy,  appendectomy, fissurectomy, thyroidectomy, tonsillectomy, breast augmentation, LASIK, right knee arthroscopy.  FAMILY HISTORY:  Remarkable for heart disease, cancer, diabetes, hypertension.  SOCIAL HISTORY:  Kari Green is a smoker at half pack per day.  I caution.  Occasional alcohol user.  Kari Green is married to a Occupational hygienist.  Kari Green is currently not working secondary to her pain.  Kari Green does have a lawyer helping her possibly for disability.  Kari Green is pleased with her job.  Kari Green has not worked in a number of years.  REVIEW OF SYSTEMS:  Otherwise noncontributory to the pain problem.  PHYSICAL EXAMINATION  GENERAL:  Pleasant female sitting comfortably in bed.  Gait, affect, appearance is normal.  Oriented x3.  HEENT:  Unremarkable.  CHEST:  Clear to auscultation and percussion.  HEART:  Regular rate and rhythm without rub, murmur, gallop.  ABDOMEN:  Soft, nontender, benign.  No hepatosplenomegaly.  BACK:  Kari Green has diffuse suprascapular, paracervical, paralumbar myofascial discomfort, impaired flexion/extension cervical lumbar spine.  Spurlings is positive left greater than right.  Positive cervical facetal compression test, left greater than right.  Kari Green has suprascapular amplification and pain over PSIS.  NEUROLOGIC:  Otherwise intact motor, sensory, reflexes.  IMPRESSION:  Fibromyalgic, doubt classic fibromyalgia myofascial pain syndrome, cigarettism, poor overall health characteristics, frequent headache.  PLAN: 1. Obtain enhanced database. 2. Consider for lidocaine infusion due to the global nature of pain    presentation and I discussed her other options with her.  Possibly, Kari Green is    a candidate for a sphenopalatine ganglion block or Botox and will review  this.  Also, initiate RS stimulator.  We will consider referral to our    physiatrist for structured physical therapy regimen and lifestyle    enhancement profile.  Laboratory data will be obtained at next visit.    Discharge  instructions given.  Kari Green does describe her allergy to Marcaine as    more of a secondary affect, probably secondary to the epinephrine, but not    classic allergy per se.  Will be cautious in this regard.  Review of    medication profile.  Likely make some adjustments here over time. Dictated by:   Jewel Baize Stevphen Rochester, M.D. Attending Physician:  Rolly Salter DD:  03/14/01 TD:  03/15/01 Job: [id number]

## 2010-07-10 NOTE — Op Note (Signed)
NAME:  Kari Green, ADEYEMI                      ACCOUNT NO.:  [id number]   MEDICAL RECORD NO.:  [id number]                   PATIENT TYPE:  AMB   LOCATION:  DSC                                  FACILITY:  [facility]   PHYSICIAN:  Cindee Salt, M.D.                    DATE OF BIRTH:  [birth year]   DATE OF PROCEDURE:  01/29/2003  DATE OF DISCHARGE:                                 OPERATIVE REPORT   PREOPERATIVE DIAGNOSIS:  Tardy ulnar nerve palsy, left arm.   POSTOPERATIVE DIAGNOSIS:  Tardy ulnar nerve palsy, left arm.   OPERATION PERFORMED:  Decompression of left ulnar nerve, elbow.   SURGEON:  Cindee Salt, M.D.   ASSISTANTCarolyne Fiscal.   ANESTHESIA:  General.   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  EMG nerve conductions are positive.   DESCRIPTION OF PROCEDURE:  The patient was brought to the operating room  where a general anesthetic was carried out without difficulty.  She was  prepped using DuraPrep in supine position, left arm free. A curvilinear  incision was made over the medial aspect of her left elbow and carried down  through subcutaneous tissue.  Bleeders were electrocauterized.  The  dissection was carried down to the [facility] fascia.  The sensory nerves were  identified and protected.  The fascia was extremely tight.  A small  extension of the flexor carpi ulnaris was present more proximally.  A  decompression to the nerve was performed freeing this along its entire  course doing a partial fasciotomy of the flexor carpi ulnaris.  The elbow  was taken through a full range of motion.  No subluxation was noted.  The  wound was copiously irrigated with saline.  The subcutaneous tissue was  closed with subcuticular 4-0 Vicryl sutures.  The skin was closed with a  subcuticular 4-0 Monocryl suture.  Steri-Strips were applied.  A sterile  compressive  dressing and splint was applied maintaining the elbow in  approximately 30 degrees of flexion.  The patient tolerated the procedure  well and was taken to the recovery room for observation in satisfactory  condition.  She is discharged home to return to the [facility]  [place] in one week on Talwin NX and Keflex.                                               Cindee Salt, M.D.    GK/MEDQ  D:  01/29/2003  T:  01/30/2003  Job:  [id number]

## 2010-07-10 NOTE — Procedures (Signed)
NAMECOTY, STUDENT            ACCOUNT NO.:  [id number]   MEDICAL RECORD NO.:  [id number]          PATIENT TYPE:  REC   LOCATION:  [facility]                          FACILITY:  [facility]   PHYSICIAN:  Celene Kras, MD        DATE OF BIRTH:  [birth year]   DATE OF PROCEDURE:  03/31/2004  DATE OF DISCHARGE:                                 OPERATIVE REPORT   PATIENT:  Kari Green.   DATE OF BIRTH:  Nov 21, 1965.   SURGEON:  Jewel Baize. Stevphen Rochester, M.D.   [name] [facility].  I evaluated  her via health and history form and 14-point review of systems.   1.  Known to me, I previously evaluated and treated her for fibromyalgic      myofascial pain syndrome, headaches and functional impairment.  In the      interim, she has been followed by Dr. Wynn Banker, whom I have discussed      with her, reviewed available imaging, and he has asked me to consider      facetal injection and possible C2 nerve root injection to manage her      cervicogenic component.  In the interim, she has also had a total knee      replacement, placed on disability and her headaches have been      progressively problematic.  She wants to minimize escalation of      controlled substances.  She has been treated at the headache clinic with      moderate success and it sounded like a utilization issue there.      Furthermore, the treatments were fleeting, three times a weeks, with      only hours of relief and secondary to multiple injections.  She thinks      she has trialed Botox and other adjuncts.  Medications have been      trialed, all with only functional temporal relationship.  She does      complain of a cervicogenic component, suprascapular and levator scapular      pain.  Flexion and extension exacerbate some of her pain.  She does not      think the braces that were placed on her teeth have helped either.  I do      not see any real evidence of TMJ.   1.  Other lifestyle  enhancements discussed.  I urge her to join the gym, not      worry about her aches and pains as much as focus through the day and      increase her level of activity.  She is a young, vital individual.  Her      husband is an Buyer, retail and gone frequently.  She has a good support      system.  States no wish to harm self or others.  I do think this is a      good strategy to get her moving.  Recommend a [employer] with a pool and      advance as tolerated.  Defer to Dr.  Kirsteins in this regard.   OBJECTIVE:  Diffuse paracervical myofascial discomfort with positive  cervical facetal compression test and right and left suboccipital  compression test positive.  Her headaches by description and exam seem to be  C2 cephalic and cervicogenic, but no new neurological findings motor,  sensory or reflexive.   It should also be noted that she has quit smoking.   She does have a relation with primary care.   IMPRESSION:  1.  C2 cephalgia.  2.  Cervicogenic headache.  3.  Myofascial pain syndrome.  4.  Portable health characteristics.  5.  Osteoarthritis.   PLAN:  1.  Will try a third occipital nerve, fourth facet, as well as five, at the      medial branch as positive provocative block leaves consideration for      radiofrequency neural ablation.  This will be assessed within the      context of frequency, intensity and duration of headaches.  I will not      plan other injection if she does not have significant improvement.  We      may move to the C2 nerve root, which may again be amenable to RF.  I      will see how she does.  2.  Other lifestyle enhancements suggested.  3.  Follow up with her and determine further course of care.   PROCEDURE:  The patient was taken to the fluoroscopy suite and placed in the  supine position.  The neck was prepped and draped in the usual fashion.  Using a 25 gauge needle, I advanced to the facet at the third occipital  nerve at the medial branch at its  bifurcation, the fourth and the fifth  nerve right and left side independent needle access points under local  anesthetic.  No CSF, heme or paresthesia.  Test block uneventfully.  Followed with 0.5 mL of lidocaine and 1% MPF at each level with a total of  40 mg of Aristocort in divided dose.   She tolerated the procedure well.  Appropriate recovery.  No complications  identified.  I will assess within the context of activities of daily living  and will see her in followup.      HH/MEDQ  D:  03/31/2004 09:29:48  T:  03/31/2004 11:27:22  Job:  [id number]

## 2010-07-10 NOTE — Op Note (Signed)
NAME:  Kari Green, Kari Green                      ACCOUNT NO.:  [id number]   MEDICAL RECORD NO.:  [id number]                   PATIENT TYPE:  AMB   LOCATION:  DSC                                  FACILITY:  [facility]   PHYSICIAN:  Cindee Salt, M.D.                    DATE OF BIRTH:  28-Feb-1965   DATE OF PROCEDURE:  04/10/2002  DATE OF DISCHARGE:                                 OPERATIVE REPORT   PREOPERATIVE DIAGNOSIS:  Pain metacarpal phalangeal joint right thumb.   POSTOPERATIVE DIAGNOSIS:  Same.   OPERATION:  Debridement right thumb metacarpal phalangeal joint.   SURGEON:  Cindee Salt, M.D.   ANESTHESIA:  General   HISTORY:  The patient is a 45 year old female with a history of injury to  her right thumb metarcapal phalangeal joint.  She has been recalcitrant to  conservative treatment.  Bone scan is positive.  Remaining studies have been  negative.  She is desirous of inspection.   PROCEDURE:  The patient was brought to the operating room after a thorough  discussion was made with her with regards to her Latex allergies with the  possibility of rescheduling if she felt uncomfortable with the situation.  She elected to proceed to have this done at [facility].  The patient  was brought to the operating room and general anesthesia carried out without  difficulty.  She was prepped and draped using Duraprep, in the supine  position, right arm free.  The limb was placed in the arthroscopy tower with  traction on the thumb, this easily defined the extensor pollicis longus  extensor brevis.  Portals were marked, the joint was then inflated with two  cc of saline, a portal was made on the radial aspect of the extensor  pollicis brevis, a transverse incision made through skin only, deepened with  a hemostat, a blunt trocar was used to enter the joint.  The joint was  inspected and significant synovitis was present, an irrigation catheter was  placed to the radial aspect of the  extensor pollicis longus or in the  interval between the extensor brevis and extensor pollicis longus.  This was  done using a 25 gauge needle initially, this allowed inspection of the joint  and erosion was present on the metacarpal head with a moderate synovitis  surrounding the area, this was on the dorsal ulnar aspect.  A second portal  was then opened in the interval between the extensor pollicis longus and  extensor brevis, deepened with a hemostat, a blunt trocar was used to enter  the joint.  An Arthro-Wind was inserted on the number one setting, a partial  debridement was then performed, this allowed visualization.  A 2 mm full  radius shaver was then introduced and a synovectomy performed.  The  cartilage fraying was removed with the full radius shaver, the expansion of  the joint showed no leakage of  agent.  The remainder of the synovectomy was  performed with the Arthro-Wind, removing significant synovitis both radially  and ulnarly.  There was no gross instability.  The proximal articular  surface of the proximal phalanx showed no arthritic changes and the  remainder of the head showed no arthritic changes.  The scope was removed  along with the instruments after inflation of the joint with 0.2 cc of Dep-  Medrol.  The portals closed with interrupted 5-0 nylon sutures.  A sterile  compressive dressing and splint was applied.  The patient tolerated the  procedure well and was taken to the recovery room for observation in  satisfactory condition.  She is discharged home to train her hands out of  [place] in one week, on Talwin NX and Keflex.  This was done under Latex  precautions.                                               Cindee Salt, M.D.    GK/MEDQ  D:  04/10/2002  T:  04/10/2002  Job:  [id number]

## 2010-07-10 NOTE — Op Note (Signed)
[place]  Patient:    Kari Green, Kari Green Visit Number: [id number] MRN: [id number]          Service Type: DSU Location: [facility] Attending Physician:  Ronne Binning Dictated by:   Nicki Reaper, M.D. Proc. Date: 07/26/01 Admit Date:  07/26/2001                             Operative Report  PREOPERATIVE DIAGNOSIS:  Carpal tunnel syndrome, right hand.  POSTOPERATIVE DIAGNOSIS:  Carpal tunnel syndrome, right hand.  OPERATION:  Decompression of right median nerve.  SURGEON:  Nicki Reaper, M.D.  ASSISTANT:  Joaquin Courts, R.N.  ANESTHESIA:  Forearm-based IV regional.  HISTORY:  The patient is a 45 year old female with a history of carpal tunnel syndrome -- EMG and nerve conductions positive -- which has not responded to conservative treatment.  DESCRIPTION OF PROCEDURE:  The patient was brought to the operating room where a forearm-based IV regional anesthetic was carried out without difficulty. She was prepped and draped using Betadine scrubbing solution with the right arm free using Latex precautions.  A longitudinal incision was made in the palm and carried down through subcutaneous tissue; bleeders were electrocauterized.  Palmar fascia was split, superficial palmar arch identified, the flexor tendons to the ring and little fingers identified.  To the ulnar side of the median nerve, the carpal retinaculum was incised with sharp dissection.  A right-angle and Sewall retractor were placed between skin and forearm fascia.  Fascia was released for approximately 3 cm proximal to the wrist crease under direct vision after the placement of a Sewall and right-angle retractor.  The canal was explored and no further lesions were identified.  A persistent median artery was present; this had not thrombosed. The wound was irrigated and the skin was closed with interrupted 5-0 nylon sutures.  A sterile compressive dressing and splint were applied.   The patient tolerated the procedure well and was taken to the recovery room for observation in satisfactory condition.  She is discharged home to return to the [facility] [place] in one week on Talwin NX and Keflex. Dictated by:   Nicki Reaper, M.D. Attending Physician:  Ronne Binning DD:  07/26/01 TD:  07/27/01 Job: [id number] [id number]
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
## 2010-07-10 NOTE — Consult Note (Signed)
NAME:  Kari Green, Kari Green                      ACCOUNT NO.:  [id number]   MEDICAL RECORD NO.:  [id number]                   PATIENT TYPE:  REC   LOCATION:  TPC                                  FACILITY:  [facility]   PHYSICIAN:  Celene Kras, MD                     DATE OF BIRTH:  1965/08/08   DATE OF CONSULTATION:  03/13/2002  DATE OF DISCHARGE:                                   CONSULTATION   REASON FOR CONSULTATION:  [facility].  I evaluate her.  I review health and history form and 14-point  review of systems.   1. The patient comes in today and has unfortunately had a bout of     cholecystitis, and subsequent cholecystectomy.  She is doing well on her     current medication regimen, seems to have an adequate functional index     and the lidocaine infusion helped her a great deal.  Unfortunately, the     cholecystitis set her back, but I do not think she is at a point where it     is clinically indicated to go ahead and proceed with any further     intervention at this time.  2. She is instructed as to home-based therapies, to join the [facility], as she     promises, and she remains cigarette-free; this is a very positive     proactive role in her own health care and I encourage this.  3. I await her records from the headache clinic.  She wants to transfer over     here, and from a utilization standpoint, I want to make sure that we are     all on the same page.  It sounds like at the headache clinic they were     doing quite a few injections; I need to know steroid exposure and their     overall directed care approach and concerns.  She is also doing imaging,     etc, and although that is fine, I just do not think it needs to be in a     formalized setting.  She has a pretty good support environment, and I     think that we can pretty much remain with most physical therapy and     activities at home.   PHYSICAL EXAMINATION:  NEUROMUSCULAR:   Objectively, diffuse suprascapular  and paracervical myofascial discomfort; lumbar myofascial position is noted.  She has active range of motion and neurologically intact, motor, sensory and  reflexive.  Although she relates her pain as a 10/10, she remains  comfortable during the exam and as I educate her about the pain scale, it is  really more about a 4.   IMPRESSION:  Fibromyalgic, doubt classic fibromyalgia.   PLAN:  1. Lifestyle enhancements.  2. We will consider treating her headaches down the  road, but I am going to     need more information.  3. We will see her in followup in a supportive manner.  Review of medication     -- maintain non-narcotic medication alternatives, discharge instructions     given.  Instructed to maintain contact with primary care.                                               Celene Kras, MD    HH/MEDQ  D:  03/13/2002  T:  03/13/2002  Job:  [id number]

## 2010-07-10 NOTE — Consult Note (Signed)
[facility]  Patient:    Kari Green, Kari Green Visit Number: [id number] MRN: [id number]          Service Type: Attending:  Jewel Baize. Stevphen Rochester, M.D. Dictated by:   Jewel Baize Stevphen Rochester, M.D.   CC:         Dr. Isabell Jarvis   Consultation Report  HISTORY OF PRESENT ILLNESS:  K[facility].  I evaluated and reviewed her health history form and 14-point review of systems.  1. Kari Green has had best relief cycling to date, and I do think the lidocaine    infusion was very useful for her.  We are going to go ahead and place her    in a conservative management position and be supportive in a conservative    fashion.  I do think it is reasonable that she has an occasional    breakthrough medication.  She is really only tolerant of Demerol, and she    will use this sparingly. 2. I am going to go ahead and prescribe Klonopin in lieu of Xanax to help    with restorative sleep capacity and also a trial of Ambien.  She is having    difficulty with sleep as her primary complaint. 3. I am going to refer her to [facility], as she is having difficulty with    right-sided numbness and tingling, and I want to rule out any cover problem    such as multiple sclerosis, etc.  I also think she probably needs a sleep    study, as a clear alpha intrusion would suggest poor restorative sleep    secondary to fibromyalgia central amplification.  OBJECTIVE:  She has diffuse suprascapular, paracervical, and paralumbar myofascial discomfort, but she has no neurological features.  Motor and sensory reflexive.  IMPRESSION:  Fibromyalgia, fibromyalgic presentation.  PLAN:  As outlined above.  Extensive consultation.  Refer to Dr. Isabell Jarvis. Will follow up in one month.  Review of medication, review of patient care agreement.  States no wish to harm self or others. Dictated by:   Jewel Baize Stevphen Rochester, M.D. Attending:  Jewel Baize. Stevphen Rochester, M.D. DD:  05/02/01 TD:   05/03/01 Job: [id number]

## 2010-07-10 NOTE — Procedures (Signed)
NAMEGENAE, STRINE            ACCOUNT NO.:  [id number]   MEDICAL RECORD NO.:  [id number]          PATIENT TYPE:  REC   LOCATION:  TPC                          FACILITY:  [facility]   PHYSICIAN:  Celene Kras, MD        DATE OF BIRTH:  December 14, 1965   DATE OF PROCEDURE:  04/14/2004  DATE OF DISCHARGE:                                 OPERATIVE REPORT   PATIENT:  Kari Green.   DATE OF BIRTH:  May 01, 1965.   SURGEON:  Jewel Baize. Stevphen Rochester, M.D.   Madalene Mickler [facility].  I evaluated  her via the Health and history form and 14-point review of systems.   1.  Zyasia's affect is a bit brighter today.  She states she had eight days      of near-completed diminution of pain perception, some recrudescence, not      quite at baseline.  Prior to removing to radiofrequency neuroablation, I      will go ahead with a second injection, clearly assess efficacy, and then      if RF is needed we do so maybe in a month or two.  I think these      injections are going to help her as a stand-alone, only move toward      radiofrequency neuroablation if needed.  Left seems to be worse than      right.  She has less myofascial extension, and she has better range of      motion.  2.  Another rationale for performing the procedure is to decrease the      disability that she perceives and improve function and quality of life.  3.  I will follow up with her and determine further course of care.  Review      of the risk of this procedure.  Review of medication.   OBJECTIVE:  Diffuse paracervical myofascial discomfort with positive  cervical facetal compression test, right and left.  Suboccipital compression  test positive.  No new neurological findings motor, sensory or reflexive.   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   PLAN:  Cervical facet medial branch injection under local anesthetic, C4,  C5, C6 and  C7, independent needle access points, right and left side.  She  has consented.   PROCEDURE:  The patient taken to the fluoroscopy suite and placed in the  supine position after prepped and draped in the usual fashion.  Using a 25-  gauge needle under local anesthetic, I advanced to the facet at the cervical  medial branch, C4, C5, C6  and C7, contributory innervation addressed under  local anesthetic, independent needle access points.  I confirmed placement.  I then inject 0.5 mL of lidocaine 1% NPF at each level with a total of 40 mg  of Aristocort in divided dose.   Tolerated the procedure well and no complication from our procedure.  Discharge instructions given.  Lifestyle enhancements reviewed.      HH/MEDQ  D:  04/14/2004 11:33:06  T:  04/14/2004 12:26:05  Job:  [id number]

## 2010-07-10 NOTE — H&P (Signed)
NAME:  Kari Green, Kari Green                      ACCOUNT NO.:  [id number]   MEDICAL RECORD NO.:  [id number]                   PATIENT TYPE:  INP   LOCATION:  0350                                 FACILITY:  [facility]   PHYSICIAN:  Corinna L. Lendell Caprice, MD             DATE OF BIRTH:  01-11-1966   DATE OF ADMISSION:  02/11/2003  DATE OF DISCHARGE:                                HISTORY & PHYSICAL   CHIEF COMPLAINT:  Abdominal pain.   HISTORY OF PRESENT ILLNESS:  Kari Green is a 45 year old white female  who was sent to the emergency room from her primary care physician's office.  She has had a week's worth of nausea.  She reports that she is able to eat  but that she also has periods of dry heaves.  Her pain is in the epigastrium  and radiates to her back.  The pain is similar to what she felt when she had  cholecystitis; she is status post cholecystectomy.  She also has history of  recurrent abdominal pain and vomiting in the past and apparently had EGD by  Dr. Everardo All. Madilyn Fireman which showed hiatal hernia.   PAST MEDICAL HISTORY:  1. Fibromyalgia.  2. Chronic migraines.  3. Thyroidectomy.  4. Recurrent abdominal pain and vomiting.  5. Status post cholecystectomy.  6. Recent decompression of the left ulnar nerve by Dr. Cindee Salt.  She is     supposed to have her sutures removed in his office tomorrow.   MEDICATIONS:  1. Ambien nightly.  2. Synthroid 300 mcg p.o. daily.  3. Phenergan as needed.  4. Demerol as needed.  5. Topamax 50 mg p.o. nightly.  6. Climara patch q.wk, although she took it off with all of her nausea last     week.   ALLERGIES:  She has an allergy to LATEX, which causes swelling; CODEINE  causes itching; MORPHINE causes itching and swelling.   SOCIAL HISTORY:  The patient rarely drinks.  She smokes less than a half a  pack of cigarettes a day.  She is married.  She does not work.  She denies  drug use.   FAMILY HISTORY:  Family history is significant for  multiple types of cancer.   REVIEW OF SYSTEMS:  HEENT:  She states that her migraines are 24 hours a  day, 7 days a week.  She also reports that she has some type of swallowing  problem.  CARDIOVASCULAR:  No chest pains or palpitations.  RESPIRATORY:  No  cough or shortness of breath.  GI:  As above.  She also reports that she has  had what sounds like esophageal dilatation by Dr. Madilyn Fireman.  GU:  No dysuria.  MUSCULOSKELETAL:  She has chronic pain and goes to the pain clinic as well  as the headache clinic.  SKIN:  No rash.  She has received several doses of  Dilaudid which sometimes causes itching.  ENDOCRINE:  She  had her thyroid  removed for a benign tumor.  PSYCHIATRIC:  No depression.  NEUROLOGIC:  No  seizures.   PHYSICAL EXAMINATION:  VITAL SIGNS:  On physical examination, temperature is  97, blood pressure 110/81, pulse 72, respiratory rate 16, oxygen saturation  100%.  GENERAL:  In general, the patient appears comfortable, in no acute distress.  HEENT:  Normocephalic, atraumatic.  Pupils equal, round and reactive to  light.  Moist mucous membranes.  Sclerae nonicteric.  NECK:  Neck is supple.  No lymphadenopathy.  LUNGS:  Lungs are clear to auscultation bilaterally without wheezes, rhonchi  or rales.  CARDIOVASCULAR:  Regular rate and rhythm without murmurs, gallops or rubs.  ABDOMEN:  Normal bowel sounds.  Soft.  She has epigastric tenderness and  guarding.  BACK:  No CVA tenderness.  GU AND RECTAL:  Deferred.  EXTREMITIES:  No clubbing, cyanosis, or edema.  NEUROLOGIC:  Alert and oriented.  Cranial nerves and sensorimotor exam are  intact.  PSYCHIATRIC:  Normal.   LABORATORY DATA:  Labs from primary care physician's office:  Her sodium is  138, potassium 3.9, chloride 103, bicarbonate 26, glucose 120, BUN 10,  creatinine 0.9, total bilirubin 1.2, alkaline phosphatase 60, AST 31, ALT  20, albumin 4.8, calcium 9.7, total protein 7.7.  Amylase is slightly  elevated at 145,  131 being the upper limit of normal.  Lipase is normal at  41.   ASSESSMENT AND PLAN:  1. Epigastric pain, possibly pancreatitis, though lipase is normal.  The     patient will be n.p.o. except for medications and ice chips.  She will     get intravenous fluids, antiemetics and pain medications.  I will check a     CAT scan in the morning with specific attention to the pancreas.  2. Fibromyalgia with chronic pain and chronic migraines.  3. Hypothyroidism.                                               Corinna L. Lendell Caprice, MD    CLS/MEDQ  D:  02/12/2003  T:  02/12/2003  Job:  [id number]   cc:   Molly Maduro A. Nicholos Johns, M.D.  [address]  [place]  [place] [address]  Fax: [phone]

## 2010-07-10 NOTE — Consult Note (Signed)
NAME:  Kari Green, MAO                      ACCOUNT NO.:  [id number]   MEDICAL RECORD NO.:  [id number]                   PATIENT TYPE:  INP   LOCATION:  0350                                 FACILITY:  [facility]   PHYSICIAN:  Althea Grimmer. Luther Parody, M.D.            DATE OF BIRTH:  1965-12-26   DATE OF CONSULTATION:  02/13/2003  DATE OF DISCHARGE:                                   CONSULTATION   REASON FOR CONSULTATION:  Ms. Kohlbeck is a 45 year old female whom I am  asked to see for epigastric pain with nausea and vomiting.  She has a long  history of gastrointestinal complaints some of which appear to be  functional.  She reports to me that she has had epigastric pain and  intermittent nausea and vomiting for the past three years. Currently she has  had worsening symptoms for approximately a week and a half. She has no  appetite, she has epigastric discomfort that radiates into her back and she  feels bloated.  She has not had any hematemesis, melena or hematochezia.  She has been evaluated several times by gastroenterologists in the past.  In  2002, she had a normal gastric emptying study with 95% emptying of the  stomach at 2 hours.  In September of 2002, she had an upper endoscopy that  was reportedly normal though she had presumptive dilation for complaints of  dysphagia which she still reports.  She tells me that her dysphagia is  secondary to a thyroidectomy with scar tissue.  She also had a 24 hour pH  probe in January of 2002 that was negative for reflux.  In her primary  physician's office prior to admission, she apparently had blood tests that  revealed a minimal amylase elevation at 145; however, here in the hospital  her amylase was normal.  She has had a CT scan of her abdomen that  demonstrates right renal parapelvic cyst but is otherwise normal.  Her total  bilirubin is 1.3 but other liver function tests and lipase are normal.  In  November of this year, she had a  cholecystectomy done laparoscopically for  acute cholecystitis with gravel in the gallbladder. An intraoperative  cholangiogram at that time was negative. The patient denies any significant  weight loss. She is status post an appendectomy and multiple GYN procedures  that have resulted in a total abdominal hysterectomy and bilateral  oophorectomy. She says that at the time of the completion of her  hysterectomy, she was noted to have multiple intraabdominal adhesions which  were lysed. She is also status post a remote fissurectomy of the anus.   PAST MEDICAL HISTORY:  Pertinent for fibromyalgia, migraines, a benign  thyroid tumor, recurrent abdominal pain and vomiting and cholecystitis.   PAST SURGICAL HISTORY:  Laparoscopic cholecystectomy, appendectomy, left  ulnar nerve decompression, total abdominal hysterectomy, bilateral salpingo-  oophorectomy and lysis of adhesions, anal fissurectomy.   CURRENT MEDICATIONS:  1.  Ambien q.h.s.  2. Synthroid 300 mcg q.d.  3. Phenergan.  4. Demerol.  5. Topamax 50 mg q.d.  6. Climara patches weekly.  7. Protonix 40 mg now being given IV q.d. and Dilaudid p.r.n.   ALLERGIES:  LATEX, CODEINE and MORPHINE.   FAMILY HISTORY:  Noncontributory.  No known inflammatory bowel disease or  colorectal neoplasia.   SOCIAL HISTORY:  She is a former smoker but still smoking occasional  cigarettes. She is married, she is an unemployed CNA, rarely drinks any  alcohol.   REVIEW OF SYMPTOMS:  GENERAL:  No significant weight loss or night sweats.  ENDOCRINE:  Status post thyroidectomy on supplementation.  No known  diabetes. SKIN:  No rash or pruritus. EYES:  No icterus or change in vision.  ENT:  No aphthous ulcers or chronic sore throat.  RESPIRATORY:  No shortness  of breath, cough or wheezing.  CARDIAC:  No chest pain, palpitations or  history of valvular heart disease.  GI:  As above.  GU:  No dysuria or  hematuria.  The remainder of the review of  systems is negative.   PHYSICAL EXAMINATION:  GENERAL:  She is a well-developed, well-nourished,  depressed appearing adult female afebrile.  VITAL SIGNS:  Blood pressure 122/79, pulse 55 and  regular.  SKIN:  Normal.  HEENT:  Eyes anicteric, oropharynx unremarkable.  NECK:  Supple without thyromegaly. There is no cervical or inguinal  adenopathy.  CHEST:  Sounds clear.  HEART:  Sounds regular rate and rhythm without murmur, gallop or rub.  ABDOMEN:  Soft with active bowel sounds and without mass or rebound. There  is tenderness in the epigastrium.  RECTAL:  Not performed.  EXTREMITIES:  Without cyanosis, clubbing, edema or rash.   IMPRESSION:  A 45 year old female who I suspect has functional abdominal  pain.  She has been endoscoped in the past without ulcer disease being  found.  Her minimal amylase elevation as an outpatient was not confirmed in  the hospital and there is no lipase elevation nor is there any CT evidence  of pancreatitis.  In addition, an intraoperative cholangiogram done at the  time of her cholecystectomy last month did not reveal any stones.  Thus it  is very unlikely that her discomfort is due to any pancreatitis either from  biliary sources or from alcohol since she rarely drinks any alcohol.  Again  I suspect her pain is largely functional; however, it would be reasonable to  rule out acute peptic disease and the patient requests the reassurance of an  upper endoscopy for this purpose.   PLAN:  Upper endoscopy is reviewed in terms of technique, preparation, and  risks and complications including bleeding and perforation. She agrees to  proceed. It will be performed this morning as soon as possible. Further  recommendations to follow that test. I suspect she also has some degree of  irritable bowel syndrome with her bloating and may benefit from Zelnorm.                                              Althea Grimmer. Luther Parody, M.D.    PJS/MEDQ  D:  02/13/2003   T:  02/13/2003  Job:  [id number]   cc:   Everardo All. Madilyn Fireman, M.D.  [address]  [place]  [place] [address]  Fax: [phone]  Leonia Reeves, MD   Elana Alm. Nicholos Johns, M.D.  [address]  [place]  [place] [address]  Fax: [phone]

## 2010-07-10 NOTE — Consult Note (Signed)
[facility]  Patient:    Kari Green, Kari Green Visit Number: [id number] MRN: [id number]          Service Type: PMG Location: [facility] Attending Physician:  Sondra Come Dictated by:   Jewel Baize Stevphen Rochester, M.D. Admit Date:  07/10/2001                            Consultation Report  INTERVAL HISTORY:  Kari Green comes in to Pain Management today.  I have reviewed her health and history form and 14-point review of systems. Chaperoned visit.  1. 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  She really describes a T12 distribution and    possibly some cranial presentation, although I doubt this.  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 2. This is resolved.  As I examine her, I find no allodynia or persistent    lesions.  I also discussed risk factors and the potential for covert    disease that would immunosuppress.  None are present. 3. Instructed to maintain contact with primary care. 4. I will go ahead and introduce Provigil.  Roxicodone will be used for pain    control with full disclosure. 5. She is to increase her aerobic capacity, to join the Y.  Home-based therapy    discussed. 6. She has quit smoking.  This is a positive predictive element. 7. She will be seeing Dr. Loleta Chance soon.  This will also help with diagnostic and    therapeutic direction. 8. We reviewed the sleep study.  She requests referral to ENT, which is fine,    but it is really mild sleep apnea I think will improve with aerobic    conditioning.  PHYSICAL EXAMINATION:  Objectively, she has no HSV lesions, diffuse paralumbar myofascial and suprascapular myofascial discomfort.  No significant change in neurologic or musculoskeletal presentation.  IMPRESSION:  Fibromyalgia,  fibromyalgic.  Probable fatigue as component.  PLAN:  As outlined above.  She does state her best relief cycling has been from lidocaine infusion, and will contemplate this again at a later date.  I would like to convalesce her and treat her in a maximal medically-controlled environment first.  Extensive consultation in this regard.  She brings a nephew in today, who is a small child, which is somewhat distractable in our exam and discussions, but no barriers to communication.  States no wish to harm self or others.  Discharge instructions given.  Extensive consultation. Dictated by:   Jewel Baize Stevphen Rochester, M.D. Attending Physician:  Sondra Come DD:  07/11/01 TD:  07/12/01 Job: [id number]

## 2010-07-10 NOTE — Consult Note (Signed)
[facility]  Patient:    Kari Green, CRAGIN Visit Number: [id number] MRN: [id number]          Service Type: DSU Location: [facility] Attending Physician:  Ronne Binning Dictated by:   Jewel Baize Stevphen Rochester, M.D. Admit Date:  07/26/2001 Discharge Date: 07/26/2001                            Consultation Report  REASON FOR CONSULTATION:  The patient comes to the [facility] today.  I evaluate her.  I review health and history form and 14-point review of systems.  1. It has come to my attention that she has filed a Probation officer, as well as historically today, we are introduced to the fact that    she has also been being seen at the headache clinic, and is having    procedures over there as well.  Prior to any further interventional    procedures, medication usage, or directive care approach, I am going to    have to have a clear understanding where we are heading.  She is asked    about these issues, and I tell her that in her best interest, to avoid    potential complications and duplication of services, she should obtain pain    care from one Shekelia Boutin, and be mindful of steroid exposure and medication    interactions. 2. She did not bring her medications in to be evaluated today.  None are    prescribed. 3. I am going to introduce aquatic therapy, advanced to land, she has not    trialed the Y and I think that this is an important strategy.  She is    clearly not a disability candidate, I do not see any indication that she    cannot return to some type of gainful employment or functional enhancement,    and I would like to give her every opportunity in this regard. 4. I think it is important to move to nonnarcotic medication alternatives    over time, and reviewed as well. 5. We placed her on Provigil, and this is not reviewed today.  I do not see    that it has made significant improvements. 6. She has quit smoking. 7. She  is to see Dr. Loleta Chance.  I review the sleep study.  This is to be evaluated    by a neurologist.  She has modest sleep apnea.  I do not think this is    really a contributory element here.  OBJECTIVE:  Objectively, diffuse suprascapular, paracervical, paralumbar myofascial discomfort, distractible, but no new neurological features, motor, sensory or reflex.  IMPRESSION:  Fibromyalgic presentation, doubt fibromyalgia.  PLAN:  Conservative management, data base enhancement.  We are going to do pharmacy check, discussed with her.  Discharge instructions given. Dictated by:   Jewel Baize Stevphen Rochester, M.D. Attending Physician:  Ronne Binning DD:  08/22/01 TD:  08/24/01 Job: [id number]

## 2010-07-10 NOTE — Procedures (Signed)
[facility]  Patient:    Kari Green, Kari Green Visit Number: [id number] MRN: [id number]          Service Type: PMG Location: [facility] Attending Physician:  Rolly Salter Dictated by:   Jewel Baize. Stevphen Rochester, M.D. Proc. Date: 03/28/01 Admit Date:  03/13/2001                             Procedure Report  Kari Green comes to enter pain management today for scheduled lidocaine infusion. I have discussed this with her, risks, complications and options are reviewed. We plan to infuse as tolerated with goal to 750 mg over a 2-3 hour period. She has had some improvement after the 300 mg infusion, with no complications identified, and as related by she and her husband she had some improvement in functional indices. She had no complications identified. Her myofascial pain seems to be improved. She has not had significant escalation in her headache pattern.  Objectively, she has diffuse suprascapular paracervical paralumbar myofascial discomfort, impaired flexion extension of the cervical lumbar spine, pain over PSIS. She has no other overt neurological deficits, motor, sensory or reflex.  IMPRESSION:  Fibromyalgia, fibromyalgia syndrome. Otherwise unchanged.  PLAN:  Lidocaine infusion. She has consented.  DESCRIPTION OF PROCEDURE:  In a monitored environment with nursing present, we infused over a 1 hour and 15 minute period 500 mg of lidocaine. She had some modest nausea, but improved when the infusion was discontinued. Appropriate recovery period. No complications from our procedure identified. She is discharged home in the company of her husband. Latex free environment. Extensive consultation as to expectations and overall review of expected progress. We dont plan another infusion unless she shows a significant response in restorative sleep capacity, decreased medication usage, and overall enhancement in quality of life. Dictated by:   Jewel Baize Stevphen Rochester,  M.D. Attending Physician:  Rolly Salter DD:  03/28/01 TD:  03/29/01 Job: [id number]

## 2010-07-10 NOTE — Procedures (Signed)
[facility]  Patient:    Kari Green, Kari Green Visit Number: [id number] MRN: [id number]          Service Type: PMG Location: [facility] Attending Physician:  Rolly Salter Dictated by:   Jewel Baize. Stevphen Rochester, M.D. Proc. Date: 03/21/01 Admit Date:  03/13/2001                             Procedure Report  HISTORY:  Josslynn Mentzer comes to the [facility] today. I have evaluated her Review of Health and History form, 14-point review of systems. I reviewed her laboratory data. I reviewed the information provided to date, I reviewed our scheduled procedure.  PROBLEM LIST: 1. We plan lidocaine infusion today with full informed consent and review. 2. Risk, complications, and options are discussed with the patient. She    accepts. 3. Lidocaine infusion will be trialed to decrease central amplification    generating significant pain, declining functional indices and quality    of life indices. She understands that she may have to have more than    one in series, and accepts. The rationale is also to minimize the    escalation of narcotic based pain medication.  PHYSICAL EXAMINATION:  Objectively, no significant change in neurological, musculoskeletal presentation. Diffuse suprascapular, paracervical, paralumbar myofascial discomfort. No new neurological features, motor, sensory, reflexive.  IMPRESSION:  Otherwise unchanged. Fibromyalgia, fibromyalgic syndrome.  PLAN:  Lidocaine infusion. She is consented.  DESCRIPTION OF PROCEDURE:  The patient is taken to the procedure area and appropriate monitors are in place. Nursing personnel present. Latex-free environment. We infuse over a three-hour period 300 mg of lidocaine uneventfully.  DISPOSITION:  The patient was appropriately recovered. Discharge instructions were given. Improved at discharge. Assess this within context of activities of daily living. Dictated by:   Jewel Baize Stevphen Rochester, M.D. Attending  Physician:  Rolly Salter DD:  03/21/01 TD:  03/22/01 Job: [id number]

## 2010-07-21 ENCOUNTER — Other Ambulatory Visit: Payer: Self-pay | Admitting: Internal Medicine

## 2010-07-21 DIAGNOSIS — R221 Localized swelling, mass and lump, neck: Secondary | ICD-10-CM

## 2010-07-22 ENCOUNTER — Ambulatory Visit
Admission: RE | Admit: 2010-07-22 | Discharge: 2010-07-22 | Disposition: A | Discharge status: Home / Self Care | Payer: Medicare Other | Source: Ambulatory Visit | Attending: Internal Medicine | Admitting: Internal Medicine

## 2010-07-22 DIAGNOSIS — R221 Localized swelling, mass and lump, neck: Secondary | ICD-10-CM

## 2010-07-24 ENCOUNTER — Emergency Department (INDEPENDENT_AMBULATORY_CARE_PROVIDER_SITE_OTHER): Payer: Managed Care, Other (non HMO)

## 2010-07-24 ENCOUNTER — Emergency Department (HOSPITAL_BASED_OUTPATIENT_CLINIC_OR_DEPARTMENT_OTHER)
Admission: EM | Admit: 2010-07-24 | Discharge: 2010-07-24 | Disposition: A | Discharge status: Home / Self Care | Payer: Managed Care, Other (non HMO) | Attending: Emergency Medicine | Admitting: Emergency Medicine

## 2010-07-24 DIAGNOSIS — R1310 Dysphagia, unspecified: Secondary | ICD-10-CM | POA: Insufficient documentation

## 2010-07-24 DIAGNOSIS — R220 Localized swelling, mass and lump, head: Secondary | ICD-10-CM

## 2010-07-24 DIAGNOSIS — M5020 Other cervical disc displacement, unspecified cervical region: Secondary | ICD-10-CM

## 2010-07-24 DIAGNOSIS — IMO0001 Reserved for inherently not codable concepts without codable children: Secondary | ICD-10-CM | POA: Insufficient documentation

## 2010-07-24 DIAGNOSIS — G8929 Other chronic pain: Secondary | ICD-10-CM | POA: Insufficient documentation

## 2010-07-24 DIAGNOSIS — F17200 Nicotine dependence, unspecified, uncomplicated: Secondary | ICD-10-CM | POA: Insufficient documentation

## 2010-07-24 LAB — BASIC METABOLIC PANEL
BUN: 10 mg/dL (ref 6–23)
Chloride: 105 mEq/L (ref 96–112)
Creatinine, Ser: 0.7 mg/dL (ref 0.4–1.2)
GFR calc non Af Amer: >60 mL/min (ref >60)
Glucose, Bld: 142 mg/dL — ABNORMAL HIGH (ref 70–99)

## 2010-07-24 LAB — DIFFERENTIAL
Eosinophils Absolute: 0.1 10*3/uL (ref 0.0–0.7)
Eosinophils Relative: 1 % (ref 0–5)
Lymphs Abs: 1.7 10*3/uL (ref 0.7–4.0)
Monocytes Relative: 7 % (ref 3–12)

## 2010-07-24 LAB — CBC
HCT: 42.1 % (ref 36.0–46.0)
MCH: 30.2 pg (ref 26.0–34.0)
MCV: 88.8 fL (ref 78.0–100.0)
Platelets: 285 10*3/uL (ref 150–400)
RDW: 14.9 % (ref 11.5–15.5)

## 2010-07-24 MED ORDER — IOHEXOL 350 MG/ML SOLN: 100.0000 mL | Freq: Once | INTRAVENOUS | Status: DC | PRN

## 2010-07-24 MED ORDER — IOHEXOL 350 MG/ML SOLN
100.0000 mL | Freq: Once | INTRAVENOUS | Status: AC | PRN
  Administered 2010-07-24: 100 mL via INTRAVENOUS

## 2010-08-27 ENCOUNTER — Other Ambulatory Visit: Payer: Self-pay | Admitting: Dermatology

## 2010-11-12 LAB — COMPREHENSIVE METABOLIC PANEL
ALT: 19
AST: 22
Alkaline Phosphatase: 75
CO2: 30
Calcium: 9.5
GFR calc Af Amer: >60
Potassium: 3.8
Sodium: 138
Total Protein: 7.4

## 2010-11-12 LAB — BASIC METABOLIC PANEL
BUN: 5 — ABNORMAL LOW
BUN: 5 — ABNORMAL LOW
Chloride: 105
Chloride: 105
Creatinine, Ser: 0.72
GFR calc non Af Amer: >60
Glucose, Bld: 115 — ABNORMAL HIGH
Glucose, Bld: 88
Potassium: 3.7
Sodium: 137

## 2010-11-12 LAB — CBC
HCT: 24.8 — ABNORMAL LOW
HCT: 27.4 — ABNORMAL LOW
HCT: 30.4 — ABNORMAL LOW
Hemoglobin: 10.7 — ABNORMAL LOW
MCHC: 34.2
MCV: 89.9
MCV: 91
Platelets: 144 — ABNORMAL LOW
Platelets: 151
Platelets: 162
RBC: 4.64
RDW: 12.6
RDW: 12.9
RDW: 13.1
RDW: 13.2
WBC: 5.7
WBC: 7.6

## 2010-11-12 LAB — DIFFERENTIAL
Basophils Absolute: 0.1
Eosinophils Absolute: 0.3
Eosinophils Relative: 3
Neutro Abs: 5.6
Smear Review: ADEQUATE

## 2010-11-12 LAB — URINALYSIS, ROUTINE W REFLEX MICROSCOPIC
Bilirubin Urine: NEGATIVE
Nitrite: NEGATIVE
Specific Gravity, Urine: 1.015
Urobilinogen, UA: 0.2
pH: 6.5

## 2010-11-12 LAB — PROTIME-INR
INR: 0.9
Prothrombin Time: 12.1

## 2010-11-12 LAB — CROSSMATCH: Antibody Screen: NEGATIVE

## 2010-11-12 LAB — URINE CULTURE: Special Requests: NEGATIVE

## 2010-11-12 LAB — PREGNANCY, URINE: Preg Test, Ur: NEGATIVE

## 2010-11-12 LAB — APTT: aPTT: 28

## 2010-11-12 LAB — HEMOGLOBIN AND HEMATOCRIT, BLOOD
HCT: 30.8 — ABNORMAL LOW
Hemoglobin: 10.8 — ABNORMAL LOW

## 2010-11-26 LAB — URINE CULTURE: Culture: NO GROWTH

## 2010-11-26 LAB — URINALYSIS, ROUTINE W REFLEX MICROSCOPIC
Ketones, ur: 15 mg/dL — AB
Nitrite: NEGATIVE
Protein, ur: 100 mg/dL — AB
Urobilinogen, UA: 0.2 mg/dL (ref 0.0–1.0)

## 2010-11-26 LAB — DIFFERENTIAL
Eosinophils Relative: 0 % (ref 0–5)
Lymphocytes Relative: 8 % — ABNORMAL LOW (ref 12–46)
Lymphs Abs: 1.2 10*3/uL (ref 0.7–4.0)
Monocytes Relative: 5 % (ref 3–12)
Neutrophils Relative %: 87 % — ABNORMAL HIGH (ref 43–77)

## 2010-11-26 LAB — CBC
HCT: 41.7 % (ref 36.0–46.0)
Platelets: 206 10*3/uL (ref 150–400)
RBC: 4.43 MIL/uL (ref 3.87–5.11)
WBC: 15.9 10*3/uL — ABNORMAL HIGH (ref 4.0–10.5)

## 2010-11-26 LAB — URINE MICROSCOPIC-ADD ON

## 2010-11-26 LAB — POCT I-STAT, CHEM 8
BUN: 10 mg/dL (ref 6–23)
Calcium, Ion: 0.9 mmol/L — ABNORMAL LOW (ref 1.12–1.32)
Chloride: 111 mEq/L (ref 96–112)
HCT: 47 % — ABNORMAL HIGH (ref 36.0–46.0)
Potassium: 4.1 mEq/L (ref 3.5–5.1)
Sodium: 140 mEq/L (ref 135–145)

## 2010-12-03 ENCOUNTER — Ambulatory Visit (HOSPITAL_COMMUNITY): Payer: Managed Care, Other (non HMO) | Attending: Internal Medicine

## 2010-12-03 DIAGNOSIS — M810 Age-related osteoporosis without current pathological fracture: Secondary | ICD-10-CM | POA: Insufficient documentation

## 2010-12-15 ENCOUNTER — Encounter (HOSPITAL_COMMUNITY): Payer: Managed Care, Other (non HMO)

## 2011-01-08 ENCOUNTER — Other Ambulatory Visit: Payer: Self-pay | Admitting: Urology

## 2011-01-25 ENCOUNTER — Encounter (HOSPITAL_BASED_OUTPATIENT_CLINIC_OR_DEPARTMENT_OTHER): Payer: Self-pay | Admitting: *Deleted

## 2011-01-25 NOTE — Patient Instructions (Signed)
Pt instructed NPO p MN 1206 except synthroid xanax w sip of h20.  May take dilaudid if needed . Pt instructed to take  either xanax or dilaudid . To [facility] 12/07 @ 0900.  hgb on arrival  

## 2011-01-28 NOTE — H&P (Signed)
History of Present Illness         Interstitial cystitis: She has a history of IC by clinical criteria. She  did undergo cystoscopy with bladder biopsy in 2/05 which revealed no abnormality on pathology however after hydrodistention to 800 cc. She did have petechial hemorrhages noted. She noted marked and significant improvement of her urinary symptoms after undergoing her hydrodistention however.  Right renal cyst. She had possible hematuria and in 1/10 underwent a CT scan which revealed no abnormality of the upper tract. A noncontrasted CT scan in 6/10 revealed bilateral, lower pole, nonobstructing 1 mm stones.   Interval history: She presents today to undergo hydrodistention which she and Dr. Vonita Moss had discussed prior to his retirement and she was planning to proceed with that although he had retired. She has been having difficulty with frequency as well as what she describes as passing blood clots.   Past Medical History Problems  1. History of  Adult Sleep Apnea 780.57 2. History of  Hypercholesterolemia 272.0 3. History of  Hypothyroidism 244.9 4. History of  Migraine Headache 346.90  Surgical History Problems  1. History of  Cystoscopy With Biopsy 2. History of  Knee Replacement  Current Meds 1. Ambien TABS; Therapy: (Recorded:12Aug2008) to 2. Amitriptyline HCl 25 MG Oral Tablet; Take 1 to 2 at bedtime for bladder; Therapy: 02Mar2010 to  (Evaluate:25Feb2011); Last Rx:02Mar2010 3. Crestor 40 MG Oral Tablet; Therapy: 06Oct2011 to 4. Neomycin-Polymyxin-HC 3.5-10000-1 Otic Solution; Therapy: 20Jul2011 to 5. Nitrofurantoin Monohyd Macro 100 MG Oral Capsule; TAKE 1 CAPSULE BY MOUTH TWICE DAILY  FOR 5 DAYS , THEN TAKE 1 CAPSULE EVERY NIGHT AT BEDTIME; Therapy: 11Nov2011 to  (Last Rx:11Nov2011) 6. Phenergan 25 MG TABS; Therapy: (Recorded:03Dec2009) to 7. Premarin TABS; Therapy: (Recorded:12Aug2008) to 8. Sulfamethoxazole-TMP DS 800-160 MG Oral Tablet; Take 1 tablet twice daily;  Therapy: 27Oct2011  to (Evaluate:01Nov2011); Last Rx:27Oct2011 9. Synthroid TABS; Therapy: (Recorded:12Aug2008) to  Allergies Medication  1. Codeine Derivatives 2. Darvocet-N 100 TABS 3. Red Dye 4. Urelle TABS Non-Medication  5. Food Dye 6. Latex  Family History Problems  1. Family history of  Cancer 2. Family history of  Heart Disease V17.49 3. Family history of  Nephrolithiasis  Social History Problems  1. Marital History - Currently Married 2. History of  Tobacco Use V15.82 Smoked 1/2 pack per day for 6 yrs and quit 1 yr ago  Review of Systems Genitourinary and gastrointestinal system(s) were reviewed and pertinent findings if present are noted.  Genitourinary: suprapubic pain.    Vitals Vital Signs  BMI Calculated: 24.32 BSA Calculated: 1.66 Height: 5 ft 3.5 in Weight: 139 lb  Blood Pressure: 103 / 70 Heart Rate: 102  Physical Exam Constitutional: Well nourished and well developed. No acute distress.  ENT:. The ears and nose are normal in appearance.  Neck: The appearance of the neck is normal and no neck mass is present.  Pulmonary: No respiratory distress and normal respiratory rhythm and effort.  Cardiovascular: Heart rate and rhythm are normal. No peripheral edema.  Abdomen: The abdomen is soft and nontender. No masses are palpated. No CVA tenderness. No hernias are palpable. No hepatosplenomegaly noted.  Lymphatics: The femoral and inguinal nodes are not enlarged or tender.  Skin: Normal skin turgor, no visible rash and no visible skin lesions.  Neuro/Psych:. Mood and affect are appropriate.   Assessment Assessed  1. Chronic Interstitial Cystitis 595.1 2. Gross Hematuria 599.71   By her history with frequency urgency and superpubic discomfort it sounds as if  she is having difficulty with her interstitial cystitis. I'm also a little bit concerned about the fact that she's been having gross hematuria and passage of clots. Because of that I told her I  would proceed with cystoscopy and hydrodistention which would allow me to evaluate the bladder completely. She has a history of kidney stones but has not had any flank pain to suggest the passage of a stone. Due to the presence of gross hematuria however I have recommended that while under anesthesia I would like to perform bilateral retrograde pyelography and if there is any abnormality noted to also evaluate that ureteroscopically. I've gone over the procedure with the patient in detail including its risks and complications and the alternatives. We also discussed the probability of success. She understands and has elected to proceed.   Plan  She'll be scheduled for an outpatient and bilateral retrograde pyelograms with possible ureteroscopy.

## 2011-01-28 NOTE — Pre-Procedure Instructions (Signed)
Pt instructed NPO p MN 1206 except synthroid xanax w sip of h20.  May take dilaudid if needed . Pt instructed to take  either xanax or dilaudid . To [facility] 12/07 @ 0900.  hgb on arrival

## 2011-01-29 ENCOUNTER — Encounter (HOSPITAL_BASED_OUTPATIENT_CLINIC_OR_DEPARTMENT_OTHER): Payer: Self-pay | Admitting: Anesthesiology

## 2011-01-29 ENCOUNTER — Ambulatory Visit (HOSPITAL_BASED_OUTPATIENT_CLINIC_OR_DEPARTMENT_OTHER)
Admission: RE | Admit: 2011-01-29 | Discharge: 2011-01-29 | Disposition: A | Discharge status: Home / Self Care | Payer: Managed Care, Other (non HMO) | Source: Ambulatory Visit | Attending: Urology | Admitting: Urology

## 2011-01-29 ENCOUNTER — Ambulatory Visit (HOSPITAL_BASED_OUTPATIENT_CLINIC_OR_DEPARTMENT_OTHER): Payer: Managed Care, Other (non HMO) | Admitting: Anesthesiology

## 2011-01-29 ENCOUNTER — Encounter (HOSPITAL_BASED_OUTPATIENT_CLINIC_OR_DEPARTMENT_OTHER)
Admission: RE | Disposition: A | Discharge status: Home / Self Care | Payer: Self-pay | Source: Ambulatory Visit | Attending: Urology

## 2011-01-29 DIAGNOSIS — E039 Hypothyroidism, unspecified: Secondary | ICD-10-CM | POA: Insufficient documentation

## 2011-01-29 DIAGNOSIS — N3010 Interstitial cystitis (chronic) without hematuria: Secondary | ICD-10-CM

## 2011-01-29 DIAGNOSIS — Z79899 Other long term (current) drug therapy: Secondary | ICD-10-CM | POA: Insufficient documentation

## 2011-01-29 DIAGNOSIS — G4730 Sleep apnea, unspecified: Secondary | ICD-10-CM | POA: Insufficient documentation

## 2011-01-29 DIAGNOSIS — E7800 Pure hypercholesterolemia, unspecified: Secondary | ICD-10-CM | POA: Insufficient documentation

## 2011-01-29 DIAGNOSIS — R310 Gross hematuria: Secondary | ICD-10-CM | POA: Insufficient documentation

## 2011-01-29 DIAGNOSIS — Z96659 Presence of unspecified artificial knee joint: Secondary | ICD-10-CM | POA: Insufficient documentation

## 2011-01-29 HISTORY — PX: CYSTO WITH HYDRODISTENSION: SHX5453

## 2011-01-29 HISTORY — DX: Interstitial cystitis (chronic) without hematuria: N30.10

## 2011-01-29 HISTORY — DX: Fibromyalgia: M79.7

## 2011-01-29 HISTORY — DX: Anxiety disorder, unspecified: F41.9

## 2011-01-29 HISTORY — DX: Nausea with vomiting, unspecified: R11.2

## 2011-01-29 HISTORY — PX: CYSTOSCOPY W/ RETROGRADES: SHX1426

## 2011-01-29 HISTORY — DX: Other specified postprocedural states: Z98.890

## 2011-01-29 HISTORY — DX: Diaphragmatic hernia without obstruction or gangrene: K44.9

## 2011-01-29 LAB — POCT HEMOGLOBIN-HEMACUE: Hemoglobin: 14.7 g/dL (ref 12.0–15.0)

## 2011-01-29 SURGERY — CYSTOSCOPY, WITH BLADDER HYDRODISTENSION
Anesthesia: General | Site: Ureter | Wound class: Clean Contaminated

## 2011-01-29 MED ORDER — IOHEXOL 350 MG/ML SOLN
INTRAVENOUS | Status: DC | PRN
  Administered 2011-01-29: 50 mL

## 2011-01-29 MED ORDER — PROMETHAZINE HCL 25 MG/ML IJ SOLN: 6.2500 mg | INTRAMUSCULAR | Status: DC | PRN

## 2011-01-29 MED ORDER — DROPERIDOL 2.5 MG/ML IJ SOLN
INTRAMUSCULAR | Status: DC | PRN
  Administered 2011-01-29: 0.625 mg via INTRAVENOUS

## 2011-01-29 MED ORDER — MIDAZOLAM HCL 5 MG/5ML IJ SOLN
INTRAMUSCULAR | Status: DC | PRN
  Administered 2011-01-29: 2 mg via INTRAVENOUS

## 2011-01-29 MED ORDER — PHENAZOPYRIDINE HCL 200 MG PO TABS: 200.0000 mg | ORAL_TABLET | Freq: Three times a day (TID) | ORAL | Status: AC | PRN

## 2011-01-29 MED ORDER — STERILE WATER FOR IRRIGATION IR SOLN
Status: DC | PRN
  Administered 2011-01-29: 3000 mL via INTRAVESICAL

## 2011-01-29 MED ORDER — FENTANYL CITRATE 0.05 MG/ML IJ SOLN: 25.0000 ug | INTRAMUSCULAR | Status: DC | PRN

## 2011-01-29 MED ORDER — FENTANYL CITRATE 0.05 MG/ML IJ SOLN
INTRAMUSCULAR | Status: DC | PRN
  Administered 2011-01-29 (×3): 50 ug via INTRAVENOUS

## 2011-01-29 MED ORDER — TRIAMCINOLONE ACETONIDE 40 MG/ML IJ SUSP
INTRAMUSCULAR | Status: DC | PRN
  Administered 2011-01-29: 10:00:00 via URETHRAL

## 2011-01-29 MED ORDER — CIPROFLOXACIN IN D5W 200 MG/100ML IV SOLN
200.0000 mg | INTRAVENOUS | Status: AC
  Administered 2011-01-29: 200 mg via INTRAVENOUS

## 2011-01-29 MED ORDER — LIDOCAINE HCL (CARDIAC) 20 MG/ML IV SOLN
INTRAVENOUS | Status: DC | PRN
  Administered 2011-01-29: 60 mg via INTRAVENOUS

## 2011-01-29 MED ORDER — HYDROCODONE-ACETAMINOPHEN 10-300 MG PO TABS: 1.0000 | ORAL_TABLET | Freq: Four times a day (QID) | ORAL | Status: DC | PRN

## 2011-01-29 MED ORDER — DEXAMETHASONE SODIUM PHOSPHATE 4 MG/ML IJ SOLN
INTRAMUSCULAR | Status: DC | PRN
  Administered 2011-01-29: 4 mg via INTRAVENOUS

## 2011-01-29 MED ORDER — OXYBUTYNIN CHLORIDE 5 MG PO TABS
5.0000 mg | ORAL_TABLET | Freq: Once | ORAL | Status: AC
  Administered 2011-01-29: 5 mg via ORAL

## 2011-01-29 MED ORDER — PHENAZOPYRIDINE HCL 200 MG PO TABS
ORAL | Status: DC | PRN
  Administered 2011-01-29: 10:00:00 via INTRAVESICAL

## 2011-01-29 MED ORDER — LACTATED RINGERS IV SOLN
INTRAVENOUS | Status: DC
  Administered 2011-01-29: 10:00:00 via INTRAVENOUS

## 2011-01-29 MED ORDER — DIPHENHYDRAMINE HCL 50 MG/ML IJ SOLN
50.0000 mg | Freq: Once | INTRAMUSCULAR | Status: AC
  Administered 2011-01-29: 50 mg via INTRAVENOUS

## 2011-01-29 MED ORDER — MIDAZOLAM HCL 2 MG/2ML IJ SOLN: 1.0000 mg | INTRAMUSCULAR | Status: DC | PRN

## 2011-01-29 MED ORDER — PROPOFOL 10 MG/ML IV EMUL
INTRAVENOUS | Status: DC | PRN
  Administered 2011-01-29: 160 mg via INTRAVENOUS

## 2011-01-29 SURGICAL SUPPLY — 35 items
ADAPTER CATH URET PLST 4-6FR (CATHETERS) ×4 IMPLANT
BAG DRAIN URO-CYSTO SKYTR STRL (DRAIN) ×4 IMPLANT
BASKET LASER NITINOL 1.9FR (BASKET) IMPLANT
BASKET SEGURA 3FR (UROLOGICAL SUPPLIES) IMPLANT
BASKET STNLS GEMINI 4WIRE 3FR (BASKET) IMPLANT
BASKET ZERO TIP NITINOL 2.4FR (BASKET) IMPLANT
BRUSH URET BIOPSY 3F (UROLOGICAL SUPPLIES) IMPLANT
CANISTER SUCT LVC 12 LTR MEDI- (MISCELLANEOUS) ×4 IMPLANT
CATH INTERMIT  6FR 70CM (CATHETERS) ×4 IMPLANT
CATH URET 5FR 28IN CONE TIP (BALLOONS)
CATH URET 5FR 70CM CONE TIP (BALLOONS) IMPLANT
CLOTH BEACON ORANGE TIMEOUT ST (SAFETY) ×4 IMPLANT
DRAPE CAMERA CLOSED 9X96 (DRAPES) ×4 IMPLANT
ELECT REM PT RETURN 9FT ADLT (ELECTROSURGICAL)
ELECTRODE REM PT RTRN 9FT ADLT (ELECTROSURGICAL) IMPLANT
GLOVE BIO SURGEON STRL SZ8 (GLOVE) ×4 IMPLANT
GOWN STRL NON-REIN LRG LVL3 (GOWN DISPOSABLE) ×8 IMPLANT
GOWN STRL REIN XL XLG (GOWN DISPOSABLE) ×4 IMPLANT
GOWN XL W/COTTON TOWEL STD (GOWNS) ×4 IMPLANT
GUIDEWIRE 0.038 PTFE COATED (WIRE) IMPLANT
GUIDEWIRE ANG ZIPWIRE 038X150 (WIRE) IMPLANT
GUIDEWIRE STR DUAL SENSOR (WIRE) IMPLANT
IV NS IRRIG 3000ML ARTHROMATIC (IV SOLUTION) ×4 IMPLANT
KIT BALLIN UROMAX 15FX10 (LABEL) IMPLANT
KIT BALLN UROMAX 15FX4 (MISCELLANEOUS) IMPLANT
KIT BALLN UROMAX 26 75X4 (MISCELLANEOUS)
NEEDLE HYPO 22GX1.5 SAFETY (NEEDLE) ×4 IMPLANT
NEEDLE SPNL 22GX3.5 QUINCKE BK (NEEDLE) ×4 IMPLANT
PACK CYSTOSCOPY (CUSTOM PROCEDURE TRAY) ×4 IMPLANT
SET HIGH PRES BAL DIL (LABEL)
SHEATH URET ACCESS 12FR/35CM (UROLOGICAL SUPPLIES) IMPLANT
SHEATH URET ACCESS 12FR/55CM (UROLOGICAL SUPPLIES) IMPLANT
SYR 20CC LL (SYRINGE) ×8 IMPLANT
SYRINGE IRR TOOMEY STRL 70CC (SYRINGE) IMPLANT
WATER STERILE IRR 3000ML UROMA (IV SOLUTION) ×4 IMPLANT

## 2011-01-29 NOTE — Op Note (Addendum)
PATIENT:  Kari Green  PRE-OPERATIVE DIAGNOSIS: 1. History of interstitial cystitis. 2. Gross hematuria.  POST-OPERATIVE DIAGNOSIS:  Same  PROCEDURE:  Procedure(s):1. Cystoscopy with hydrodistention. 2. Urethral dilatation. 3. Bilateral retrograde pyelograms with interpretation. 4. Injection of local anesthetic and steroid.  SURGEON:  Garnett Farm  INDICATION: The patient is a 45 year old female with a history of interstitial cystitis. In the past she had an episode of gross hematuria and underwent cystoscopy with bladder biopsies by Dr. Vonita Moss in 1/10. No abnormality was noted on her pathology. At that time she was found to have an 800 cc bladder. She noted marked and significant improvement of her urinary symptoms after undergoing hydrodistention. When I saw her recently she had previously discussed repeat hydrodistention with Dr. Vonita Moss prior to his retirement and this was planned but not scheduled. She has been experiencing urinary frequency and symptoms consistent with her IC. In addition she describes passing what sounds like blood clots. She does have a history of kidney stones but has not had any flank pain. She is brought to the operating room for her hydrodistention and will also undergo evaluation of her upper tracts.  ANESTHESIA:  General  EBL:  Minimal  DRAINS: none  LOCAL MEDICATIONS USED:  Kenalog and Marcaine  Description of procedure: After informed consent the patient was taken to the major OR, placed on the table and administered general anesthesia. She was then moved to the dorsal lithotomy position and her genitalia was sterilely prepped and draped. An official timeout was performed. Appropriate latex precautions were maintained throughout the case.  Initially I performed cystoscopy by introducing a 22 French cystoscope with 12 lens into the bladder. The bladder was fully and systematically inspected. No tumors stones or inflammatory lesions were  identified within the bladder. Both ureteral orifices were noted to be of normal configuration and position. I then proceeded with hydrodistention. With the patient under anesthesia her bladder capacity was found to be 550 cc with the water placed at 80 cm. After the bladder was filled to capacity it was drained and the terminal effluent was noted to be nearly clear. Reinspection of the bladder revealed minimal petechial hemorrhages lateral to the ureteral orifices. A repeat hydrodistention was then undertaken and the bladder was left filled to capacity for approximately 3 minutes and then drained.  Urethral dilatation was then performed using female sounds and she was dilated from 3 [place] to 59 [place] without difficulty.  I then instilled a solution of crushed peridium and Marcaine into the bladder and left this indwelling. I then proceeded to perform local injection of steroid and local anesthetic by using a spinal needle and placing a sound in the urethra this allowed me to determine the location of urethra and I injected Kenalog and Marcaine periurethrally along the proximal aspect of the urethra and the bladder base region by palpation trans-vaginally.  The patient was then awakened and taken to the recovery room in stable and satisfactory condition. She tolerated the procedure well and there were no intraoperative complications.  PLAN OF CARE: Discharge to home after PACU  PATIENT DISPOSITION:  PACU - hemodynamically stable.  She also underwent bilateral retrograde pyelograms in order to evaluate her for a source of her gross hematuria. This was performed by passing a 6 [place] open-ended ureteral catheter through the cystoscope and into the bladder. I then injected full-strength contrast under direct fluoroscopy up the right ureter initially and noted the right ureter to be entirely normal throughout its length. As  the intrarenal collecting system filled I noted to be delicate with no mass effect  or filling defects. There were no abnormalities of the entire right-sided system. I then performed a left retrograde pyelogram in the identical fashion and noted again a delicate left ureter with no filling defect or mass effect in the normal intrarenal collecting system again free of any mass effect, filling defects or other abnormalities. Her bilateral retrograde pyelograms were normal.

## 2011-01-29 NOTE — Transfer of Care (Signed)
Immediate Anesthesia Transfer of Care Note  Patient: Kari Green  Procedure(s) Performed:  CYSTOSCOPY/HYDRODISTENSION; CYSTOSCOPY WITH RETROGRADE PYELOGRAM  Patient Location: PACU  Anesthesia Type: General  Level of Consciousness: awake, oriented, sedated and patient cooperative  Airway & Oxygen Therapy: Patient Spontanous Breathing and Patient connected to face mask oxygen  Post-op Assessment: Report given to PACU RN and Post -op Vital signs reviewed and stable  Post vital signs: Reviewed and stable  Complications: No apparent anesthesia complications

## 2011-01-29 NOTE — Progress Notes (Signed)
Dr. Vernie Ammons into see, pt unable to take pyridium, instructed to not take RX given.

## 2011-01-29 NOTE — Anesthesia Preprocedure Evaluation (Signed)
Anesthesia Evaluation  Patient identified by MRN, date of birth, ID band Patient awake    Reviewed: Allergy & Precautions, H&P , NPO status , Patient's Chart, lab work & pertinent test results  History of Anesthesia Complications (+) PONV and Family history of anesthesia reaction  Airway Mallampati: II TM Distance: >3 FB Neck ROM: Full    Dental No notable dental hx.    Pulmonary neg pulmonary ROS, sleep apnea ,  clear to auscultation  Pulmonary exam normal       Cardiovascular neg cardio ROS Regular Normal    Neuro/Psych  Headaches,  Neuromuscular disease Negative Neurological ROS  Negative Psych ROS   GI/Hepatic negative GI ROS, Neg liver ROS, hiatal hernia,   Endo/Other  Negative Endocrine ROS  Renal/GU negative Renal ROS  Genitourinary negative   Musculoskeletal negative musculoskeletal ROS (+) Fibromyalgia -, narcotic dependent  Abdominal   Peds negative pediatric ROS (+)  Hematology negative hematology ROS (+)   Anesthesia Other Findings   Reproductive/Obstetrics negative OB ROS                           Anesthesia Physical Anesthesia Plan  ASA: II  Anesthesia Plan: General   Post-op Pain Management:    Induction: Intravenous  Airway Management Planned: LMA  Additional Equipment:   Intra-op Plan:   Post-operative Plan: Extubation in OR  Informed Consent: I have reviewed the patients History and Physical, chart, labs and discussed the procedure including the risks, benefits and alternatives for the proposed anesthesia with the patient or authorized representative who has indicated his/her understanding and acceptance.   Dental advisory given  Plan Discussed with: CRNA  Anesthesia Plan Comments:         Anesthesia Quick Evaluation

## 2011-01-29 NOTE — Anesthesia Postprocedure Evaluation (Signed)
  Anesthesia Post-op Note  Patient: Kari Green  Procedure(s) Performed:  CYSTOSCOPY/HYDRODISTENSION; CYSTOSCOPY WITH RETROGRADE PYELOGRAM  Patient Location: PACU  Anesthesia Type: General  Level of Consciousness: awake and alert   Airway and Oxygen Therapy: Patient Spontanous Breathing  Post-op Pain: mild  Post-op Assessment: Post-op Vital signs reviewed, Patient's Cardiovascular Status Stable, Respiratory Function Stable, Patent Airway and No signs of Nausea or vomiting  Post-op Vital Signs: stable  Complications: No apparent anesthesia complications

## 2011-01-29 NOTE — Anesthesia Procedure Notes (Signed)
Procedure Name: LMA Insertion Date/Time: 01/29/2011 10:00 AM Performed by: Renella Cunas D Pre-anesthesia Checklist: Patient identified, Emergency Drugs available, Suction available and Patient being monitored Patient Re-evaluated:Patient Re-evaluated prior to inductionOxygen Delivery Method: Circle System Utilized Preoxygenation: Pre-oxygenation with 100% oxygen Intubation Type: IV induction Ventilation: Mask ventilation without difficulty LMA: LMA inserted LMA Size: 4.0 Number of attempts: 1 Placement Confirmation: positive ETCO2 Tube secured with: Tape Dental Injury: Teeth and Oropharynx as per pre-operative assessment

## 2011-02-01 ENCOUNTER — Encounter (HOSPITAL_BASED_OUTPATIENT_CLINIC_OR_DEPARTMENT_OTHER): Payer: Self-pay | Admitting: Urology

## 2011-02-23 HISTORY — PX: MELANOMA EXCISION: SHX5266

## 2011-02-23 HISTORY — PX: NASAL SINUS SURGERY: SHX719

## 2011-02-24 DIAGNOSIS — J309 Allergic rhinitis, unspecified: Secondary | ICD-10-CM | POA: Diagnosis not present

## 2011-02-26 ENCOUNTER — Ambulatory Visit (INDEPENDENT_AMBULATORY_CARE_PROVIDER_SITE_OTHER): Payer: Managed Care, Other (non HMO) | Admitting: Surgery

## 2011-02-26 ENCOUNTER — Encounter (INDEPENDENT_AMBULATORY_CARE_PROVIDER_SITE_OTHER): Payer: Self-pay | Admitting: Surgery

## 2011-02-26 VITALS — BP 124/76 | HR 76 | Temp 97.0°F | Ht 63.0 in | Wt 137.4 lb

## 2011-02-26 DIAGNOSIS — K648 Other hemorrhoids: Secondary | ICD-10-CM | POA: Diagnosis not present

## 2011-02-26 DIAGNOSIS — K589 Irritable bowel syndrome without diarrhea: Secondary | ICD-10-CM | POA: Diagnosis not present

## 2011-02-26 DIAGNOSIS — K581 Irritable bowel syndrome with constipation: Secondary | ICD-10-CM | POA: Insufficient documentation

## 2011-02-26 DIAGNOSIS — K580 Irritable bowel syndrome with diarrhea: Secondary | ICD-10-CM | POA: Insufficient documentation

## 2011-02-26 NOTE — Progress Notes (Signed)
Subjective:     Patient ID: Kari Green, female   DOB: 1965-12-26, 46 y.o.   MRN: [id number]  HPI  Kari Green  11/12/1965 [id number]  Patient Care Team: Thayer Headings, MD as PCP - General (Internal Medicine) Barrie Folk, MD as Consulting Physician (Gastroenterology) Garnett Farm, MD as Consulting Physician (Urology)  This patient is a 46 y.o.female who presents today for surgical evaluation.   Reason for visit: Worsening hemorrhoids.  The patient is a female with irritable bowel syndrome, fibromyalgia, interstitial cystitis. She struggled with rectal bleeding and pain for most of her life. She's been told she had hemorrhoids in the past. She struggled with constipation. Occasional diarrhea especially with greasy foods since she had her cholecystectomy in 2004.  She has been seen by [place] in 2011 and 2012 for thrombosed hemorrhoids and bleeding. We recommend trying to improve her bowel regimen as that is the most likely the trigger for these hemorrhoids.  She has tried increased fiber in her diet. She takes fiber supplements. She is followed by Dr. Madilyn Fireman with gastroenterology. Her IBS has been rather stable. More recently, she's getting bleeding with every bowel movement. Often in the hemorrhoids or prolapse out and become very painful and uncomfortable. She returns to see if something more aggressive can be done.  She claims she has tried to call us to be seen sooner. Unfortunately it did not happen.  She was initially very frustrated with our staff with having to wait as I was running behind in an overbooked clinic. She eventually calmed down and seemed fine with me.  Patient Active Problem List  Diagnoses  . Hemorrhoids, internal, with bleeding & prolapse  . Irritable bowel syndrome with constipation    Past Medical History  Diagnosis Date  . PONV (postoperative nausea and vomiting)   . Blood transfusion   . Anxiety   . Headache     followed by Dr. Modesto Charon.  has nerves  burned in head and hips  . Chronic kidney disease     hx kidney stones  . IC (interstitial cystitis)   . Fibromyalgia   . Hiatal hernia   . Cancer     melanoma  . Sleep apnea     mild- no cpap  . Rectal bleeding   . Rectal pain   . Hyperlipidemia   . Osteoporosis   . Thyroid disease   . Abdominal pain   . Constipation   . Lymph node(s) enlarged   . Arthritis pain   . Irritable bowel syndrome (IBS) 02/26/2011    Past Surgical History  Procedure Date  . Achilles tendon repair 11/12  . Abdominal hysterectomy   . Cholecystectomy   . Tonsillectomy   . Joint replacement '05     rt knee surgeries x 9  . Cystoscopy w/ dilation of bladder   . Cystoscopy with hydrodistension and biopsy   . Sp arthro elbow*l*   . Rt thumb   . Diagnostic laparoscopy   . Total thyroidectomy   . Appendectomy   . Left carpal tunnel    . Breast surgery     augmentation x2  . Cysto with hydrodistension 01/29/2011    Procedure: CYSTOSCOPY/HYDRODISTENSION;  Surgeon: Garnett Farm, MD;  Location: [facility];  Service: Urology;  Laterality: N/A;  . Cystoscopy w/ retrogrades 01/29/2011    Procedure: CYSTOSCOPY WITH RETROGRADE PYELOGRAM;  Surgeon: Garnett Farm, MD;  Location: [facility];  Service: Urology;  Laterality: Bilateral;  History   Social History  . Marital Status: Married    Spouse Name: N/A    Number of Children: N/A  . Years of Education: N/A   Occupational History  . Not on file.   Social History Main Topics  . Smoking status: Former Smoker    Quit date: 01/25/2008  . Smokeless tobacco: Not on file  . Alcohol Use: No  . Drug Use: No  . Sexually Active:    Other Topics Concern  . Not on file   Social History Narrative  . No narrative on file    Family History  Problem Relation Age of Onset  . Anesthesia problems Mother   . Cancer Mother     ovarian  . Cancer Daughter     cervical  . Cancer Maternal Aunt     breast    Current  outpatient prescriptions:ALPRAZolam (XANAX) 0.5 MG tablet, Take 0.5 mg by mouth 3 (three) times daily as needed.  , Disp: , Rfl: ;  diphenhydrAMINE (BENADRYL) 50 MG tablet, Take 50 mg by mouth at bedtime as needed.  , Disp: , Rfl: ;  estradiol (CLIMARA - DOSED IN MG/24 HR) 0.1 mg/24hr, Place 1 patch onto the skin once a week.  , Disp: , Rfl:  Hydrocodone-Acetaminophen (VICODIN HP) 10-300 MG TABS, Take 1-2 tablets by mouth every 6 (six) hours as needed., Disp: 30 each, Rfl: 0;  HYDROmorphone (DILAUDID) 2 MG tablet, Take 2 mg by mouth every 6 (six) hours as needed.  , Disp: , Rfl: ;  hydrOXYzine (ATARAX/VISTARIL) 10 MG tablet, Take 10 mg by mouth 3 (three) times daily as needed.  , Disp: , Rfl:  levothyroxine (SYNTHROID, LEVOTHROID) 175 MCG tablet, Take 175 mcg by mouth daily.  , Disp: , Rfl:   Allergies  Allergen Reactions  . Latex Swelling  . Morphine And Related Itching    States all pain meds cause itching  . Prednisone     Yellow 5 Red 4  . Red Dye Hives  . Yellow Dyes (Non-Tartrazine) Swelling    BP 124/76  Pulse 76  Temp(Src) 97 F (36.1 C) (Temporal)  Ht 5\' 3"  (1.6 m)  Wt 137 lb 6.4 oz (62.324 kg)  BMI 24.34 kg/m2  SpO2 98%     Review of Systems  Constitutional: Negative for fever, chills, diaphoresis, appetite change and fatigue.  HENT: Negative for ear pain, sore throat, trouble swallowing, neck pain and ear discharge.   Eyes: Negative for photophobia, discharge and visual disturbance.  Respiratory: Negative for cough, choking, chest tightness and shortness of breath.   Cardiovascular: Negative for chest pain and palpitations.  Gastrointestinal: Positive for constipation. Negative for nausea, vomiting, abdominal pain, diarrhea, anal bleeding and rectal pain.       BM Q3days  Genitourinary: Positive for difficulty urinating. Negative for dysuria, urgency, frequency, vaginal bleeding, vaginal discharge and pelvic pain.  Musculoskeletal: Positive for arthralgias. Negative  for myalgias and gait problem.  Skin: Negative for color change, pallor and rash.  Neurological: Positive for headaches. Negative for dizziness, speech difficulty, weakness and numbness.  Hematological: Positive for adenopathy.  Psychiatric/Behavioral: Negative for confusion and agitation. The patient is not nervous/anxious.        Objective:   Physical Exam  Constitutional: She is oriented to person, place, and time. She appears well-developed and well-nourished. No distress.  HENT:  Head: Normocephalic.  Mouth/Throat: Oropharynx is clear and moist. No oropharyngeal exudate.  Eyes: Conjunctivae and EOM are normal. Pupils are equal, round, and  reactive to light. No scleral icterus.  Neck: Normal range of motion. No tracheal deviation present.  Cardiovascular: Normal rate and intact distal pulses.   Pulmonary/Chest: Effort normal. No respiratory distress. She exhibits no tenderness.  Abdominal: Soft. She exhibits no distension. There is no tenderness. Hernia confirmed negative in the right inguinal area and confirmed negative in the left inguinal area.       Incisions clean with normal healing ridges.  No hernias  Genitourinary: No vaginal discharge found.       Perianal skin clean with good hygiene.  No pruritis.  No pilonidal disease.  No fissure.  No abscess/fistula.    Tolerates digital and anoscopic rectal exam but sensitive.  Normal sphincter tone.  No rectal masses.  Hemorrhoidal piles moderately enlarged & imflammed.  R posterior prolapsing a little   Musculoskeletal: Normal range of motion. She exhibits no tenderness.  Lymphadenopathy:       Right: No inguinal adenopathy present.       Left: No inguinal adenopathy present.  Neurological: She is alert and oriented to person, place, and time. No cranial nerve deficit. She exhibits normal muscle tone. Coordination normal.  Skin: Skin is warm and dry. No rash noted. She is not diaphoretic.  Psychiatric: She has a normal mood and  affect. Her behavior is normal.       Assessment:     Hemorrhoids worsening with more frequent bleeding and prolapse. Chronic constipation with IBS and chronic narcotics but compliant on bowel regimen    Plan:     I had a long discussion with her. With all her pain and comorbidities, I worry that she still had more pain and longer recovery time needed for any hemorrhoid surgery. However, she has been compliant with her regimen. I recommend she upper fiber regimen until she's having bowel movements were regular. She has some breakthrough diarrhea aside within this is the best that she gets.  I have offered her surgery since I don't think infections and pending her to be sufficient with prolapsing of the persistent bleeding. She's her head interventions done before. She wishes to proceed.  The anatomy & physiology of the anorectal region was discussed.  The pathophysiology of hemorrhoids and differential diagnosis was discussed.  Natural history risks without surgery was discussed.   I stressed the importance of a bowel regimen to have daily soft bowel movements to minimize progression of disease.  Interventions such as sclerotherapy & banding were discussed.  The patient's symptoms are not adequately controlled by medicines and other non-operative treatments.  I feel the risks & problems of no surgery outweigh the operative risks; therefore, I recommended surgery to treat the hemorrhoids by ligation, pexy, and possible resection.  Risks such as bleeding, infection, need for further treatment, heart attack, death, and other risks were discussed.   I noted a good likelihood this will help address the problem.  Goals of post-operative recovery were discussed as well.  Possibility that this will not correct all symptoms was explained.  Post-operative pain, bleeding, constipation, and other problems after surgery were discussed.  We will work to minimize complications.   Educational handouts further  explaining the pathology, treatment options, and bowel regimen were given as well.  Questions were answered.  The patient expresses understanding & wishes to proceed with surgery.

## 2011-02-26 NOTE — Patient Instructions (Signed)

## 2011-03-02 DIAGNOSIS — J309 Allergic rhinitis, unspecified: Secondary | ICD-10-CM | POA: Diagnosis not present

## 2011-03-05 DIAGNOSIS — J309 Allergic rhinitis, unspecified: Secondary | ICD-10-CM | POA: Diagnosis not present

## 2011-03-15 ENCOUNTER — Encounter (HOSPITAL_COMMUNITY): Payer: Self-pay | Admitting: Pharmacy Technician

## 2011-03-15 DIAGNOSIS — J3489 Other specified disorders of nose and nasal sinuses: Secondary | ICD-10-CM | POA: Diagnosis not present

## 2011-03-15 DIAGNOSIS — M26609 Unspecified temporomandibular joint disorder, unspecified side: Secondary | ICD-10-CM | POA: Diagnosis not present

## 2011-03-15 DIAGNOSIS — J309 Allergic rhinitis, unspecified: Secondary | ICD-10-CM | POA: Diagnosis not present

## 2011-03-15 DIAGNOSIS — H9209 Otalgia, unspecified ear: Secondary | ICD-10-CM | POA: Diagnosis not present

## 2011-03-15 DIAGNOSIS — H608X9 Other otitis externa, unspecified ear: Secondary | ICD-10-CM | POA: Diagnosis not present

## 2011-03-23 DIAGNOSIS — J309 Allergic rhinitis, unspecified: Secondary | ICD-10-CM | POA: Diagnosis not present

## 2011-03-24 ENCOUNTER — Inpatient Hospital Stay (HOSPITAL_COMMUNITY): Admission: RE | Admit: 2011-03-24 | Payer: Managed Care, Other (non HMO) | Source: Ambulatory Visit

## 2011-03-25 ENCOUNTER — Encounter (HOSPITAL_COMMUNITY): Payer: Self-pay

## 2011-03-25 ENCOUNTER — Telehealth (INDEPENDENT_AMBULATORY_CARE_PROVIDER_SITE_OTHER): Payer: Self-pay | Admitting: General Surgery

## 2011-03-25 ENCOUNTER — Encounter (HOSPITAL_COMMUNITY)
Admission: RE | Admit: 2011-03-25 | Discharge: 2011-03-25 | Disposition: A | Discharge status: Home / Self Care | Payer: Managed Care, Other (non HMO) | Source: Ambulatory Visit | Attending: Surgery | Admitting: Surgery

## 2011-03-25 ENCOUNTER — Other Ambulatory Visit (INDEPENDENT_AMBULATORY_CARE_PROVIDER_SITE_OTHER): Payer: Self-pay | Admitting: Surgery

## 2011-03-25 HISTORY — DX: Hemangioma of intra-abdominal structures: D18.03

## 2011-03-25 HISTORY — DX: Unspecified osteoarthritis, unspecified site: M19.90

## 2011-03-25 LAB — BASIC METABOLIC PANEL
CO2: 27 mEq/L (ref 19–32)
Calcium: 9.5 mg/dL (ref 8.4–10.5)
Creatinine, Ser: 0.74 mg/dL (ref 0.50–1.10)
GFR calc non Af Amer: >90 mL/min (ref >90)
Glucose, Bld: 82 mg/dL (ref 70–99)

## 2011-03-25 LAB — CBC
Hemoglobin: 15.4 g/dL — ABNORMAL HIGH (ref 12.0–15.0)
MCH: 29.7 pg (ref 26.0–34.0)
MCHC: 33.6 g/dL (ref 30.0–36.0)
MCV: 88.2 fL (ref 78.0–100.0)
Platelets: 299 10*3/uL (ref 150–400)
RBC: 5.19 MIL/uL — ABNORMAL HIGH (ref 3.87–5.11)

## 2011-03-25 MED ORDER — DEXTROSE 5 % IV SOLN: 2.0000 g | Freq: Three times a day (TID) | INTRAVENOUS | Status: DC

## 2011-03-25 NOTE — Patient Instructions (Signed)
21 Kari Green  03/25/2011   Your procedure is scheduled on:  03/26/11  Surgery 0900-1000  Report to [facility] at 0700 AM.  Call this number if you have problems the morning of surgery: [phone]   Remember:   Do not eat food:After Midnight. Tonight   May have clear liquids:until Midnight .tonight  Clear liquids include soda, tea, black coffee, apple or grape juice, broth.  Take these medicines the morning of surgery with A SIP OF WATER: dilaudid with sip water,  Xanax if needed  Do not wear jewelry, make-up or nail polish.  Do not wear lotions, powders, or perfumes. You may wear deodorant.  Do not shave 48 hours prior to surgery.  Do not bring valuables to the hospital.  Contacts, dentures or bridgework may not be worn into surgery.  Leave suitcase in the car. After surgery it may be brought to your room.  For patients admitted to the hospital, checkout time is 11:00 AM the day of discharge.   Patients discharged the day of surgery will not be allowed to drive home.  Name and phone number of your driver: daughter  Special Instructions: CHG Shower Use Special Wash: 1/2 bottle night before surgery and 1/2 bottle morning of surgery. Regular soap face  And privates   Please read over the following fact sheets that you were given: MRSA Information

## 2011-03-25 NOTE — Telephone Encounter (Signed)
Kari Green with pre op testing called. Patient scheduled for a hemorrhoidectomy in the am and does not have an antibiotic ordered. Patient has had a total knee replacement in the past and always takes antibiotics prior to dental appts. She is also putting a message on the patient's chart at the hospital. Does this patient need antiobiotics ordered? Please advise.

## 2011-03-25 NOTE — Pre-Procedure Instructions (Signed)
Patient asked about antibiotic administration pre surgery due to knee replacement- none ordered- chart flagged for MD in am and verbally spoke with Liliana Cline at [facility] this info and to be forwarded to Dr Michaell Cowing [phone]

## 2011-03-25 NOTE — Pre-Procedure Instructions (Signed)
Chest x ray and EKG not done due to no criteria met

## 2011-03-26 ENCOUNTER — Encounter (HOSPITAL_COMMUNITY): Payer: Self-pay

## 2011-03-26 ENCOUNTER — Encounter (HOSPITAL_COMMUNITY): Payer: Self-pay | Admitting: *Deleted

## 2011-03-26 ENCOUNTER — Encounter (HOSPITAL_COMMUNITY): Payer: Self-pay | Admitting: Certified Registered Nurse Anesthetist

## 2011-03-26 ENCOUNTER — Ambulatory Visit (HOSPITAL_COMMUNITY)
Admission: RE | Admit: 2011-03-26 | Discharge: 2011-03-26 | Disposition: A | Discharge status: Home / Self Care | Payer: Managed Care, Other (non HMO) | Source: Ambulatory Visit | Attending: Surgery | Admitting: Surgery

## 2011-03-26 ENCOUNTER — Encounter (HOSPITAL_COMMUNITY)
Admission: RE | Disposition: A | Discharge status: Home / Self Care | Payer: Self-pay | Source: Ambulatory Visit | Attending: Surgery

## 2011-03-26 ENCOUNTER — Ambulatory Visit (HOSPITAL_COMMUNITY): Payer: Managed Care, Other (non HMO) | Admitting: Certified Registered Nurse Anesthetist

## 2011-03-26 DIAGNOSIS — E785 Hyperlipidemia, unspecified: Secondary | ICD-10-CM | POA: Insufficient documentation

## 2011-03-26 DIAGNOSIS — M810 Age-related osteoporosis without current pathological fracture: Secondary | ICD-10-CM | POA: Insufficient documentation

## 2011-03-26 DIAGNOSIS — N3010 Interstitial cystitis (chronic) without hematuria: Secondary | ICD-10-CM | POA: Insufficient documentation

## 2011-03-26 DIAGNOSIS — N189 Chronic kidney disease, unspecified: Secondary | ICD-10-CM | POA: Diagnosis not present

## 2011-03-26 DIAGNOSIS — G4710 Hypersomnia, unspecified: Secondary | ICD-10-CM | POA: Diagnosis not present

## 2011-03-26 DIAGNOSIS — R109 Unspecified abdominal pain: Secondary | ICD-10-CM | POA: Diagnosis not present

## 2011-03-26 DIAGNOSIS — R197 Diarrhea, unspecified: Secondary | ICD-10-CM | POA: Insufficient documentation

## 2011-03-26 DIAGNOSIS — K625 Hemorrhage of anus and rectum: Secondary | ICD-10-CM | POA: Insufficient documentation

## 2011-03-26 DIAGNOSIS — Z79899 Other long term (current) drug therapy: Secondary | ICD-10-CM | POA: Insufficient documentation

## 2011-03-26 DIAGNOSIS — K6289 Other specified diseases of anus and rectum: Secondary | ICD-10-CM | POA: Insufficient documentation

## 2011-03-26 DIAGNOSIS — IMO0001 Reserved for inherently not codable concepts without codable children: Secondary | ICD-10-CM | POA: Insufficient documentation

## 2011-03-26 DIAGNOSIS — K649 Unspecified hemorrhoids: Secondary | ICD-10-CM | POA: Diagnosis not present

## 2011-03-26 DIAGNOSIS — K648 Other hemorrhoids: Secondary | ICD-10-CM | POA: Insufficient documentation

## 2011-03-26 DIAGNOSIS — K589 Irritable bowel syndrome without diarrhea: Secondary | ICD-10-CM | POA: Insufficient documentation

## 2011-03-26 DIAGNOSIS — G4733 Obstructive sleep apnea (adult) (pediatric): Secondary | ICD-10-CM | POA: Insufficient documentation

## 2011-03-26 HISTORY — PX: TRANSANAL HEMORRHOIDAL DEARTERIALIZATION: SHX6136

## 2011-03-26 SURGERY — TRANSANAL HEMORRHOIDAL DEARTERIALIZATION
Anesthesia: General | Site: Anus | Wound class: Clean Contaminated

## 2011-03-26 MED ORDER — ONDANSETRON HCL 4 MG/2ML IJ SOLN: 4.0000 mg | Freq: Four times a day (QID) | INTRAMUSCULAR | Status: DC | PRN

## 2011-03-26 MED ORDER — 0.9 % SODIUM CHLORIDE (POUR BTL) OPTIME
TOPICAL | Status: DC | PRN
  Administered 2011-03-26: 1000 mL

## 2011-03-26 MED ORDER — MIDAZOLAM HCL 5 MG/5ML IJ SOLN
INTRAMUSCULAR | Status: DC | PRN
  Administered 2011-03-26 (×2): 2 mg via INTRAVENOUS

## 2011-03-26 MED ORDER — DEXTROSE 5 % IV SOLN
2.0000 g | INTRAVENOUS | Status: AC
  Administered 2011-03-26: 2 g via INTRAVENOUS
  Filled 2011-03-26: qty 2

## 2011-03-26 MED ORDER — PROPOFOL 10 MG/ML IV BOLUS
INTRAVENOUS | Status: DC | PRN
  Administered 2011-03-26: 200 mg via INTRAVENOUS

## 2011-03-26 MED ORDER — LACTATED RINGERS IV SOLN
INTRAVENOUS | Status: DC
  Administered 2011-03-26: 1000 mL via INTRAVENOUS

## 2011-03-26 MED ORDER — OXYCODONE HCL 5 MG PO TABS: 5.0000 mg | ORAL_TABLET | ORAL | Status: DC | PRN

## 2011-03-26 MED ORDER — MEPERIDINE HCL 50 MG/ML IJ SOLN: 6.2500 mg | INTRAMUSCULAR | Status: DC | PRN

## 2011-03-26 MED ORDER — DIPHENHYDRAMINE HCL 50 MG/ML IJ SOLN
INTRAMUSCULAR | Status: DC | PRN
  Administered 2011-03-26: 50 mg via INTRAVENOUS

## 2011-03-26 MED ORDER — LIDOCAINE HCL (CARDIAC) 20 MG/ML IV SOLN
INTRAVENOUS | Status: DC | PRN
  Administered 2011-03-26: 80 mg via INTRAVENOUS

## 2011-03-26 MED ORDER — MEPERIDINE HCL 25 MG/ML IJ SOLN: 6.2500 mg | INTRAMUSCULAR | Status: DC | PRN

## 2011-03-26 MED ORDER — SUCCINYLCHOLINE CHLORIDE 20 MG/ML IJ SOLN
INTRAMUSCULAR | Status: DC | PRN
  Administered 2011-03-26: 100 mg via INTRAVENOUS

## 2011-03-26 MED ORDER — SODIUM CHLORIDE 0.9 % IJ SOLN: 3.0000 mL | Freq: Two times a day (BID) | INTRAMUSCULAR | Status: DC

## 2011-03-26 MED ORDER — FENTANYL CITRATE 0.05 MG/ML IJ SOLN: 25.0000 ug | INTRAMUSCULAR | Status: DC | PRN

## 2011-03-26 MED ORDER — CEFOXITIN SODIUM-DEXTROSE 1-4 GM-% IV SOLR (PREMIX)
INTRAVENOUS | Status: AC
  Filled 2011-03-26: qty 50

## 2011-03-26 MED ORDER — SODIUM CHLORIDE 0.9 % IV SOLN: 250.0000 mL | INTRAVENOUS | Status: DC | PRN

## 2011-03-26 MED ORDER — ACETAMINOPHEN 10 MG/ML IV SOLN
INTRAVENOUS | Status: DC | PRN
  Administered 2011-03-26: 1000 mg via INTRAVENOUS

## 2011-03-26 MED ORDER — DIPHENHYDRAMINE HCL 50 MG/ML IJ SOLN
INTRAMUSCULAR | Status: AC
  Filled 2011-03-26: qty 1

## 2011-03-26 MED ORDER — FENTANYL CITRATE 0.05 MG/ML IJ SOLN
INTRAMUSCULAR | Status: AC
  Filled 2011-03-26: qty 2

## 2011-03-26 MED ORDER — PROMETHAZINE HCL 25 MG/ML IJ SOLN: 12.5000 mg | Freq: Four times a day (QID) | INTRAMUSCULAR | Status: DC | PRN

## 2011-03-26 MED ORDER — LACTATED RINGERS IV SOLN: INTRAVENOUS | Status: DC

## 2011-03-26 MED ORDER — ONDANSETRON HCL 4 MG/2ML IJ SOLN
INTRAMUSCULAR | Status: AC
  Filled 2011-03-26: qty 2

## 2011-03-26 MED ORDER — ACETAMINOPHEN 650 MG RE SUPP
650.0000 mg | RECTAL | Status: DC | PRN
  Filled 2011-03-26: qty 1

## 2011-03-26 MED ORDER — SODIUM CHLORIDE 0.9 % IJ SOLN: 3.0000 mL | INTRAMUSCULAR | Status: DC | PRN

## 2011-03-26 MED ORDER — HYDROMORPHONE HCL PF 1 MG/ML IJ SOLN: 0.2500 mg | INTRAMUSCULAR | Status: DC | PRN

## 2011-03-26 MED ORDER — PROMETHAZINE HCL 25 MG/ML IJ SOLN: 6.2500 mg | INTRAMUSCULAR | Status: DC | PRN

## 2011-03-26 MED ORDER — ACETAMINOPHEN 325 MG PO TABS: 650.0000 mg | ORAL_TABLET | ORAL | Status: DC | PRN

## 2011-03-26 MED ORDER — BUPIVACAINE LIPOSOME 1.3 % IJ SUSP
20.0000 mL | INTRAMUSCULAR | Status: AC
  Administered 2011-03-26: 20 mL
  Filled 2011-03-26: qty 20

## 2011-03-26 MED ORDER — ACETAMINOPHEN 10 MG/ML IV SOLN
INTRAVENOUS | Status: AC
  Filled 2011-03-26: qty 100

## 2011-03-26 MED ORDER — FENTANYL CITRATE 0.05 MG/ML IJ SOLN
INTRAMUSCULAR | Status: DC | PRN
  Administered 2011-03-26 (×4): 50 ug via INTRAVENOUS
  Administered 2011-03-26: 100 ug via INTRAVENOUS
  Administered 2011-03-26: 50 ug via INTRAVENOUS

## 2011-03-26 SURGICAL SUPPLY — 47 items
BLADE EXTENDED COATED 6.5IN (ELECTRODE) IMPLANT
BLADE HEX COATED 2.75 (ELECTRODE) ×2 IMPLANT
BLADE SURG 15 STRL LF DISP TIS (BLADE) ×2 IMPLANT
BLADE SURG 15 STRL SS (BLADE) ×2
BRIEF STRETCH FOR OB PAD LRG (UNDERPADS AND DIAPERS) IMPLANT
CANISTER SUCTION 2500CC (MISCELLANEOUS) ×2 IMPLANT
CLOTH BEACON ORANGE TIMEOUT ST (SAFETY) ×2 IMPLANT
DECANTER SPIKE VIAL GLASS SM (MISCELLANEOUS) ×2 IMPLANT
DRAPE LAPAROTOMY T 102X78X121 (DRAPES) ×2 IMPLANT
DRSG PAD ABDOMINAL 8X10 ST (GAUZE/BANDAGES/DRESSINGS) ×2 IMPLANT
ELECT REM PT RETURN 9FT ADLT (ELECTROSURGICAL) ×2
ELECTRODE REM PT RTRN 9FT ADLT (ELECTROSURGICAL) ×1 IMPLANT
GAUZE SPONGE 4X4 16PLY XRAY LF (GAUZE/BANDAGES/DRESSINGS) ×2 IMPLANT
GLOVE BIOGEL PI IND STRL 7.0 (GLOVE) ×1 IMPLANT
GLOVE BIOGEL PI INDICATOR 7.0 (GLOVE) ×1
GLOVE ECLIPSE 8.0 STRL XLNG CF (GLOVE) ×2 IMPLANT
GLOVE INDICATOR 8.0 STRL GRN (GLOVE) ×4 IMPLANT
GOWN STRL NON-REIN LRG LVL3 (GOWN DISPOSABLE) ×2 IMPLANT
GOWN STRL REIN XL XLG (GOWN DISPOSABLE) ×4 IMPLANT
HEMOSTAT SURGICEL 4X8 (HEMOSTASIS) ×2 IMPLANT
KIT BASIN OR (CUSTOM PROCEDURE TRAY) ×2 IMPLANT
KIT SLIDE ONE PROLAPS HEMORR (KITS) ×2 IMPLANT
LEGGING LITHOTOMY PAIR STRL (DRAPES) IMPLANT
LUBRICANT JELLY K Y 4OZ (MISCELLANEOUS) ×2 IMPLANT
NDL SAFETY ECLIPSE 18X1.5 (NEEDLE) ×1 IMPLANT
NEEDLE HYPO 18GX1.5 SHARP (NEEDLE) ×1
NEEDLE HYPO 22GX1.5 SAFETY (NEEDLE) ×2 IMPLANT
NS IRRIG 1000ML POUR BTL (IV SOLUTION) ×2 IMPLANT
PACK BASIC VI WITH GOWN DISP (CUSTOM PROCEDURE TRAY) ×2 IMPLANT
PACK LITHOTOMY IV (CUSTOM PROCEDURE TRAY) IMPLANT
PENCIL BUTTON HOLSTER BLD 10FT (ELECTRODE) ×2 IMPLANT
SPONGE GAUZE 4X4 12PLY (GAUZE/BANDAGES/DRESSINGS) ×2 IMPLANT
SPONGE SURGIFOAM ABS GEL 100 (HEMOSTASIS) ×2 IMPLANT
SPONGE SURGIFOAM ABS GEL 12-7 (HEMOSTASIS) IMPLANT
SUT CHROMIC 2 0 SH (SUTURE) IMPLANT
SUT CHROMIC 3 0 SH 27 (SUTURE) IMPLANT
SUT PROLENE 2 0 BLUE (SUTURE) ×2 IMPLANT
SUT VIC AB 2-0 SH 27 (SUTURE)
SUT VIC AB 2-0 SH 27X BRD (SUTURE) IMPLANT
SUT VIC AB 2-0 UR6 27 (SUTURE) IMPLANT
SUT VIC AB 3-0 SH 18 (SUTURE) IMPLANT
SUT VIC AB 3-0 SH 27 (SUTURE)
SUT VIC AB 3-0 SH 27XBRD (SUTURE) IMPLANT
SUT VIC AB 4-0 SH 18 (SUTURE) IMPLANT
SYR 20CC LL (SYRINGE) ×2 IMPLANT
TOWEL OR 17X26 10 PK STRL BLUE (TOWEL DISPOSABLE) ×2 IMPLANT
YANKAUER SUCT BULB TIP 10FT TU (MISCELLANEOUS) ×2 IMPLANT

## 2011-03-26 NOTE — Progress Notes (Signed)
Pt wants to go home. She will take pain med she has at home

## 2011-03-26 NOTE — Transfer of Care (Signed)
Immediate Anesthesia Transfer of Care Note  Patient: Kari Green  Procedure(s) Performed:  TRANSANAL HEMORRHOIDAL DEARTERIALIZATION - Transanal Hemorrhoidal Deaterialization Ligation & Pexy; PEXY  Patient Location: PACU  Anesthesia Type: General  Level of Consciousness: awake, alert  and oriented  Airway & Oxygen Therapy: Patient Spontanous Breathing and Patient connected to face mask oxygen  Post-op Assessment: Report given to PACU RN  Post vital signs: Reviewed and stable  Complications: No apparent anesthesia complications

## 2011-03-26 NOTE — Anesthesia Procedure Notes (Signed)
Procedure Name: Intubation Date/Time: 03/26/2011 9:06 AM Performed by: Hulan Fess Pre-anesthesia Checklist: Patient identified, Emergency Drugs available, Suction available, Patient being monitored and Timeout performed Patient Re-evaluated:Patient Re-evaluated prior to inductionOxygen Delivery Method: Circle System Utilized Preoxygenation: Pre-oxygenation with 100% oxygen Intubation Type: IV induction Ventilation: Mask ventilation without difficulty Laryngoscope Size: Mac and 3 Grade View: Grade II Tube type: Oral Tube size: 8.0 mm Number of attempts: 1 Placement Confirmation: ETT inserted through vocal cords under direct vision and positive ETCO2 Secured at: 22 cm Tube secured with: Tape Dental Injury: Teeth and Oropharynx as per pre-operative assessment

## 2011-03-26 NOTE — H&P (View-Only) (Signed)
Subjective:     Patient ID: Kari Green, female   DOB: 09/18/1965, 46 y.o.   MRN: [id number]  HPI  Kari Green  06/21/1965 [id number]  Patient Care Team: Brian Mackenzie, MD as PCP - General (Internal Medicine) John C Hayes, MD as Consulting Physician (Gastroenterology) Mark C Ottelin, MD as Consulting Physician (Urology)  This patient is a 46 y.o.female who presents today for surgical evaluation.   Reason for visit: Worsening hemorrhoids.  The patient is a female with irritable bowel syndrome, fibromyalgia, interstitial cystitis. She struggled with rectal bleeding and pain for most of her life. She's been told she had hemorrhoids in the past. She struggled with constipation. Occasional diarrhea especially with greasy foods since she had her cholecystectomy in 2004.  She has been seen by us in 2011 and 2012 for thrombosed hemorrhoids and bleeding. We recommend trying to improve her bowel regimen as that is the most likely the trigger for these hemorrhoids.  She has tried increased fiber in her diet. She takes fiber supplements. She is followed by Dr. Hayes with gastroenterology. Her IBS has been rather stable. More recently, she's getting bleeding with every bowel movement. Often in the hemorrhoids or prolapse out and become very painful and uncomfortable. She returns to see if something more aggressive can be done.  She claims she has tried to call us to be seen sooner. Unfortunately it did not happen.  She was initially very frustrated with our staff with having to wait as I was running behind in an overbooked clinic. She eventually calmed down and seemed fine with me.  Patient Active Problem List  Diagnoses  . Hemorrhoids, internal, with bleeding & prolapse  . Irritable bowel syndrome with constipation    Past Medical History  Diagnosis Date  . PONV (postoperative nausea and vomiting)   . Blood transfusion   . Anxiety   . Headache     followed by Dr. Wong.  has nerves  burned in head and hips  . Chronic kidney disease     hx kidney stones  . IC (interstitial cystitis)   . Fibromyalgia   . Hiatal hernia   . Cancer     melanoma  . Sleep apnea     mild- no cpap  . Rectal bleeding   . Rectal pain   . Hyperlipidemia   . Osteoporosis   . Thyroid disease   . Abdominal pain   . Constipation   . Lymph node(s) enlarged   . Arthritis pain   . Irritable bowel syndrome (IBS) 02/26/2011    Past Surgical History  Procedure Date  . Achilles tendon repair 11/12  . Abdominal hysterectomy   . Cholecystectomy   . Tonsillectomy   . Joint replacement '05     rt knee surgeries x 9  . Cystoscopy w/ dilation of bladder   . Cystoscopy with hydrodistension and biopsy   . Sp arthro elbow*l*   . Rt thumb   . Diagnostic laparoscopy   . Total thyroidectomy   . Appendectomy   . Left carpal tunnel    . Breast surgery     augmentation x2  . Cysto with hydrodistension 01/29/2011    Procedure: CYSTOSCOPY/HYDRODISTENSION;  Surgeon: Mark C Ottelin, MD;  Location: [facility];  Service: Urology;  Laterality: N/A;  . Cystoscopy w/ retrogrades 01/29/2011    Procedure: CYSTOSCOPY WITH RETROGRADE PYELOGRAM;  Surgeon: Mark C Ottelin, MD;  Location: [facility];  Service: Urology;  Laterality: Bilateral;      History   Social History  . Marital Status: Married    Spouse Name: N/A    Number of Children: N/A  . Years of Education: N/A   Occupational History  . Not on file.   Social History Main Topics  . Smoking status: Former Smoker    Quit date: 01/25/2008  . Smokeless tobacco: Not on file  . Alcohol Use: No  . Drug Use: No  . Sexually Active:    Other Topics Concern  . Not on file   Social History Narrative  . No narrative on file    Family History  Problem Relation Age of Onset  . Anesthesia problems Mother   . Cancer Mother     ovarian  . Cancer Daughter     cervical  . Cancer Maternal Aunt     breast    Current  outpatient prescriptions:ALPRAZolam (XANAX) 0.5 MG tablet, Take 0.5 mg by mouth 3 (three) times daily as needed.  , Disp: , Rfl: ;  diphenhydrAMINE (BENADRYL) 50 MG tablet, Take 50 mg by mouth at bedtime as needed.  , Disp: , Rfl: ;  estradiol (CLIMARA - DOSED IN MG/24 HR) 0.1 mg/24hr, Place 1 patch onto the skin once a week.  , Disp: , Rfl:  Hydrocodone-Acetaminophen (VICODIN HP) 10-300 MG TABS, Take 1-2 tablets by mouth every 6 (six) hours as needed., Disp: 30 each, Rfl: 0;  HYDROmorphone (DILAUDID) 2 MG tablet, Take 2 mg by mouth every 6 (six) hours as needed.  , Disp: , Rfl: ;  hydrOXYzine (ATARAX/VISTARIL) 10 MG tablet, Take 10 mg by mouth 3 (three) times daily as needed.  , Disp: , Rfl:  levothyroxine (SYNTHROID, LEVOTHROID) 175 MCG tablet, Take 175 mcg by mouth daily.  , Disp: , Rfl:   Allergies  Allergen Reactions  . Latex Swelling  . Morphine And Related Itching    States all pain meds cause itching  . Prednisone     Yellow 5 Red 4  . Red Dye Hives  . Yellow Dyes (Non-Tartrazine) Swelling    BP 124/76  Pulse 76  Temp(Src) 97 F (36.1 C) (Temporal)  Ht 5' 3" (1.6 m)  Wt 137 lb 6.4 oz (62.324 kg)  BMI 24.34 kg/m2  SpO2 98%     Review of Systems  Constitutional: Negative for fever, chills, diaphoresis, appetite change and fatigue.  HENT: Negative for ear pain, sore throat, trouble swallowing, neck pain and ear discharge.   Eyes: Negative for photophobia, discharge and visual disturbance.  Respiratory: Negative for cough, choking, chest tightness and shortness of breath.   Cardiovascular: Negative for chest pain and palpitations.  Gastrointestinal: Positive for constipation. Negative for nausea, vomiting, abdominal pain, diarrhea, anal bleeding and rectal pain.       BM Q3days  Genitourinary: Positive for difficulty urinating. Negative for dysuria, urgency, frequency, vaginal bleeding, vaginal discharge and pelvic pain.  Musculoskeletal: Positive for arthralgias. Negative  for myalgias and gait problem.  Skin: Negative for color change, pallor and rash.  Neurological: Positive for headaches. Negative for dizziness, speech difficulty, weakness and numbness.  Hematological: Positive for adenopathy.  Psychiatric/Behavioral: Negative for confusion and agitation. The patient is not nervous/anxious.        Objective:   Physical Exam  Constitutional: She is oriented to person, place, and time. She appears well-developed and well-nourished. No distress.  HENT:  Head: Normocephalic.  Mouth/Throat: Oropharynx is clear and moist. No oropharyngeal exudate.  Eyes: Conjunctivae and EOM are normal. Pupils are equal, round, and   reactive to light. No scleral icterus.  Neck: Normal range of motion. No tracheal deviation present.  Cardiovascular: Normal rate and intact distal pulses.   Pulmonary/Chest: Effort normal. No respiratory distress. She exhibits no tenderness.  Abdominal: Soft. She exhibits no distension. There is no tenderness. Hernia confirmed negative in the right inguinal area and confirmed negative in the left inguinal area.       Incisions clean with normal healing ridges.  No hernias  Genitourinary: No vaginal discharge found.       Perianal skin clean with good hygiene.  No pruritis.  No pilonidal disease.  No fissure.  No abscess/fistula.    Tolerates digital and anoscopic rectal exam but sensitive.  Normal sphincter tone.  No rectal masses.  Hemorrhoidal piles moderately enlarged & imflammed.  R posterior prolapsing a little   Musculoskeletal: Normal range of motion. She exhibits no tenderness.  Lymphadenopathy:       Right: No inguinal adenopathy present.       Left: No inguinal adenopathy present.  Neurological: She is alert and oriented to person, place, and time. No cranial nerve deficit. She exhibits normal muscle tone. Coordination normal.  Skin: Skin is warm and dry. No rash noted. She is not diaphoretic.  Psychiatric: She has a normal mood and  affect. Her behavior is normal.       Assessment:     Hemorrhoids worsening with more frequent bleeding and prolapse. Chronic constipation with IBS and chronic narcotics but compliant on bowel regimen    Plan:     I had a long discussion with her. With all her pain and comorbidities, I worry that she still had more pain and longer recovery time needed for any hemorrhoid surgery. However, she has been compliant with her regimen. I recommend she upper fiber regimen until she's having bowel movements were regular. She has some breakthrough diarrhea aside within this is the best that she gets.  I have offered her surgery since I don't think infections and pending her to be sufficient with prolapsing of the persistent bleeding. She's her head interventions done before. She wishes to proceed.  The anatomy & physiology of the anorectal region was discussed.  The pathophysiology of hemorrhoids and differential diagnosis was discussed.  Natural history risks without surgery was discussed.   I stressed the importance of a bowel regimen to have daily soft bowel movements to minimize progression of disease.  Interventions such as sclerotherapy & banding were discussed.  The patient's symptoms are not adequately controlled by medicines and other non-operative treatments.  I feel the risks & problems of no surgery outweigh the operative risks; therefore, I recommended surgery to treat the hemorrhoids by ligation, pexy, and possible resection.  Risks such as bleeding, infection, need for further treatment, heart attack, death, and other risks were discussed.   I noted a good likelihood this will help address the problem.  Goals of post-operative recovery were discussed as well.  Possibility that this will not correct all symptoms was explained.  Post-operative pain, bleeding, constipation, and other problems after surgery were discussed.  We will work to minimize complications.   Educational handouts further  explaining the pathology, treatment options, and bowel regimen were given as well.  Questions were answered.  The patient expresses understanding & wishes to proceed with surgery.        

## 2011-03-26 NOTE — Op Note (Signed)
[name], [name]            ACCOUNT NO.:  [id number]  MEDICAL RECORD NO.:  [id number]  LOCATION:  [facility]                         FACILITY:  [facility]  PHYSICIAN:  Ardeth Sportsman, MD     DATE OF BIRTH:  17-Feb-1966  DATE OF PROCEDURE:  03/26/2011 DATE OF DISCHARGE:  03/26/2011                              OPERATIVE REPORT   PREOPERATIVE DIAGNOSIS:  Hemorrhoids with bleeding and prolapse.  POSTOPERATIVE DIAGNOSIS:  Hemorrhoids with bleeding and prolapse.  PROCEDURE PERFORMED: 1. Examination under anesthesia. 2. Transanal hemorrhoidal dearterialization and hemorrhoidopexy using     THD system.  ANESTHESIA: 1. General anesthesia. 2. Anorectal field block using liposomal bupivacaine.  SPECIMEN:  None.  DRAINS:  None.  ESTIMATED BLOOD LOSS:  Minimal.  COMPLICATIONS:  None major.  INDICATIONS:  [name] is a 46 year old female with chronic bowel problems including irritable bowel syndrome, constipation and chronic pain.  She has had external hemorrhoids and has been seen by our office numerous times with office procedures and treatments.  She has had better bowel control with a better bowel regimen, but still has persistent bleeding and occasional prolapse as well.  Because it involved multiple piles I recommended examination under anesthesia with hemorrhoidal dearterialization and possible hemorrhoidectomy and hemorrhoidopexy.  Technique, risks, benefits and alternatives were discussed.  Questions answered.  She agreed to proceed.  OPERATIVE FINDINGS:  She had a right posterior greater than anterior greater than left lateral hemorrhoids easily bleeding.  The right posterior greater than anterior had some evidence of moderate prolapse.  There were no other anorectal problems.  DESCRIPTION OF PROCEDURE:  Informed consent was confirmed.  The patient underwent general anesthesia without any difficulty.  Given her history of prior knee replacement I gave her IV  cephalexin.  She was positioned prone in a jack-knife position.  Her perianal region was prepped and draped in sterile fashion.  Surgical time-out confirmed our plan.  A digital rectal examination confirmed our findings.  I did finger dilation up to a moderate size anoscope.  I then transitioned over to the [facility] Doppler anoscope.  I isolated the hemorrhoidal arterials and ligated them.  I did a high ligation using a 2-0 Vicryl on a UR-6 needle about 6 cm from the anal verge.  I did right posterior, right lateral, right anterior hemorrhoidal signals.  I did the same on the left side.  With each ligation I performed a figure-of-eight high ligation and then run the stitch down to the anorectal line and tied down to act as a hemorrhoidopexy as well.  I did reevaluation and she had a mild thickening in the left anterior left lateral.  I did a figure-of-eight ligation.  Hemostasis was excellent.  I placed a Gelfoam cylinder for hemostasis.  The patient was extubated and taken to the recovery room in stable condition.  I discussed postop care with the patient in detail.  I am about to discuss it with her husband.     Ardeth Sportsman, MD     SCG/MEDQ  D:  03/26/2011  T:  03/26/2011  Job:  [id number]  cc:   Dr. Madilyn Fireman  Dr. Ronne Binning

## 2011-03-26 NOTE — Interval H&P Note (Signed)
History and Physical Interval Note:  03/26/2011 8:19 AM  Kari Green  has presented today for surgery, with the diagnosis of bleeding/prolapsing hemorhoids  The various methods of treatment have been discussed with the patient and family. After consideration of risks, benefits and other options for treatment, the patient has consented to  Procedure(s): TRANSANAL HEMORRHOIDAL DEARTERIALIZATION with hemorrhoid ligation & PEXY as a surgical intervention .  The patients' history has been reviewed, patient examined, no change in status, stable for surgery.  I have reviewed the patients' chart and labs.  Questions were answered to the patient's satisfaction.     [name]

## 2011-03-26 NOTE — Anesthesia Preprocedure Evaluation (Addendum)
Anesthesia Evaluation  Patient identified by MRN, date of birth, ID band Patient awake    Reviewed: Allergy & Precautions, H&P , NPO status , Patient's Chart, lab work & pertinent test results  History of Anesthesia Complications (+) PONV, AWARENESS UNDER ANESTHESIA and Family history of anesthesia reaction  Airway Mallampati: II TM Distance: >3 FB Neck ROM: Full    Dental No notable dental hx.    Pulmonary neg pulmonary ROS, sleep apnea ,  clear to auscultation  Pulmonary exam normal       Cardiovascular neg cardio ROS Regular Normal    Neuro/Psych  Headaches,  Neuromuscular disease Negative Neurological ROS  Negative Psych ROS   GI/Hepatic negative GI ROS, Neg liver ROS, hiatal hernia,   Endo/Other  Negative Endocrine ROS  Renal/GU negative Renal ROS  Genitourinary negative   Musculoskeletal negative musculoskeletal ROS (+) Fibromyalgia -, narcotic dependent  Abdominal   Peds negative pediatric ROS (+)  Hematology negative hematology ROS (+)   Anesthesia Other Findings   Reproductive/Obstetrics negative OB ROS                           Anesthesia Physical Anesthesia Plan  ASA: II  Anesthesia Plan: General   Post-op Pain Management:    Induction: Intravenous  Airway Management Planned: Oral ETT  Additional Equipment:   Intra-op Plan:   Post-operative Plan: Extubation in OR  Informed Consent: I have reviewed the patients History and Physical, chart, labs and discussed the procedure including the risks, benefits and alternatives for the proposed anesthesia with the patient or authorized representative who has indicated his/her understanding and acceptance.   Dental advisory given  Plan Discussed with: CRNA  Anesthesia Plan Comments:        Anesthesia Quick Evaluation

## 2011-03-26 NOTE — Progress Notes (Signed)
Up to bed side commode and voided well

## 2011-03-26 NOTE — Progress Notes (Signed)
Very small amt bloody rectal bleeding with voiding

## 2011-03-26 NOTE — Brief Op Note (Signed)
03/26/2011  10:20 AM  PATIENT:  Kari Green  46 y.o. female  Patient Care Team: Thayer Headings, MD as PCP - General (Internal Medicine) Barrie Folk, MD as Consulting Physician (Gastroenterology) Garnett Farm, MD as Consulting Physician (Urology)  PRE-OPERATIVE DIAGNOSIS:  bleeding/prolapsing hemorhoids  POST-OPERATIVE DIAGNOSIS:   bleeding/prolapsing hemorhoids  PROCEDURE:  Procedure(s): TRANSANAL HEMORRHOIDAL DEARTERIALIZATION PEXY  SURGEON:  Surgeon(s): Ardeth Sportsman, MD  PHYSICIAN ASSISTANT:   ASSISTANTS: none   ANESTHESIA:   regional and general  EBL:     BLOOD ADMINISTERED:none  DRAINS: none   LOCAL MEDICATIONS USED:  OTHER Liposomal bupivicaine anorectal block 20mL  SPECIMEN:  No Specimen  DISPOSITION OF SPECIMEN:  N/A  COUNTS:  YES  TOURNIQUET:  * No tourniquets in log *  DICTATION: .Other Dictation: Dictation Number [id number]  PLAN OF CARE: Discharge to home after PACU  PATIENT DISPOSITION:  PACU - hemodynamically stable.   Delay start of Pharmacological VTE agent (>24hrs) due to surgical blood loss or risk of bleeding:  {YES/NO/NOT APPLICABLE:20182

## 2011-03-26 NOTE — Progress Notes (Signed)
DR. Acey Lav made aware patient needs to be signed out from PACU- O.K. To take patient to Short Stay  Without sign out- will sign patient out later

## 2011-03-28 ENCOUNTER — Encounter (HOSPITAL_BASED_OUTPATIENT_CLINIC_OR_DEPARTMENT_OTHER): Payer: Self-pay | Admitting: Emergency Medicine

## 2011-03-28 ENCOUNTER — Emergency Department (HOSPITAL_BASED_OUTPATIENT_CLINIC_OR_DEPARTMENT_OTHER)
Admission: EM | Admit: 2011-03-28 | Discharge: 2011-03-28 | Disposition: A | Discharge status: Home / Self Care | Payer: Managed Care, Other (non HMO) | Attending: Emergency Medicine | Admitting: Emergency Medicine

## 2011-03-28 DIAGNOSIS — R339 Retention of urine, unspecified: Secondary | ICD-10-CM | POA: Insufficient documentation

## 2011-03-28 LAB — URINALYSIS, MICROSCOPIC ONLY
Glucose, UA: NEGATIVE mg/dL
Hgb urine dipstick: NEGATIVE
Ketones, ur: 40 mg/dL — AB
Leukocytes, UA: NEGATIVE
Nitrite: NEGATIVE
Protein, ur: NEGATIVE mg/dL
Specific Gravity, Urine: 1.023 (ref 1.005–1.030)
Urobilinogen, UA: 0.2 mg/dL (ref 0.0–1.0)
pH: 5.5 (ref 5.0–8.0)

## 2011-03-28 LAB — BASIC METABOLIC PANEL
BUN: 10 mg/dL (ref 6–23)
CO2: 24 mEq/L (ref 19–32)
Calcium: 9.2 mg/dL (ref 8.4–10.5)
Chloride: 103 mEq/L (ref 96–112)
Creatinine, Ser: 0.7 mg/dL (ref 0.50–1.10)
GFR calc Af Amer: >90 mL/min (ref >90)
GFR calc non Af Amer: >90 mL/min (ref >90)
Glucose, Bld: 84 mg/dL (ref 70–99)
Potassium: 3.3 mEq/L — ABNORMAL LOW (ref 3.5–5.1)
Sodium: 140 mEq/L (ref 135–145)

## 2011-03-28 LAB — PREGNANCY, URINE: Preg Test, Ur: NEGATIVE

## 2011-03-28 MED ORDER — ONDANSETRON HCL 4 MG/2ML IJ SOLN
INTRAMUSCULAR | Status: AC
  Administered 2011-03-28: 4 mg
  Filled 2011-03-28: qty 2

## 2011-03-28 MED ORDER — MORPHINE SULFATE 2 MG/ML IJ SOLN
INTRAMUSCULAR | Status: AC
  Filled 2011-03-28: qty 1

## 2011-03-28 MED ORDER — MORPHINE SULFATE 4 MG/ML IJ SOLN
6.0000 mg | Freq: Once | INTRAMUSCULAR | Status: AC
  Administered 2011-03-28: 6 mg via INTRAVENOUS

## 2011-03-28 MED ORDER — MORPHINE SULFATE 4 MG/ML IJ SOLN
INTRAMUSCULAR | Status: AC
  Administered 2011-03-28: 6 mg via INTRAVENOUS
  Filled 2011-03-28: qty 1

## 2011-03-28 MED ORDER — DIPHENHYDRAMINE HCL 50 MG/ML IJ SOLN
25.0000 mg | Freq: Once | INTRAMUSCULAR | Status: AC
  Administered 2011-03-28: 25 mg via INTRAVENOUS

## 2011-03-28 MED ORDER — SODIUM CHLORIDE 0.9 % IV BOLUS (SEPSIS): 1000.0000 mL | Freq: Once | INTRAVENOUS | Status: DC

## 2011-03-28 MED ORDER — DIPHENHYDRAMINE HCL 50 MG/ML IJ SOLN
INTRAMUSCULAR | Status: AC
  Administered 2011-03-28: 25 mg via INTRAVENOUS
  Filled 2011-03-28: qty 1

## 2011-03-28 MED ORDER — ONDANSETRON HCL 4 MG/2ML IJ SOLN: 4.0000 mg | Freq: Once | INTRAMUSCULAR | Status: DC

## 2011-03-28 MED ORDER — ONDANSETRON 4 MG PO TBDP
4.0000 mg | ORAL_TABLET | Freq: Once | ORAL | Status: AC
  Administered 2011-03-28: 4 mg via ORAL
  Filled 2011-03-28: qty 1

## 2011-03-28 NOTE — ED Notes (Signed)
Foley remains indwelling with no difficulty at this time.  Converted to leg bag for discharge.

## 2011-03-28 NOTE — ED Provider Notes (Signed)
History  This chart was scribed for Raeford Razor, MD by Melba Coon. The patient was seen in room MH11/MH11 and the patient's care was started at 5:41PM.     CSN: [id number]  Arrival date & time 03/28/11  1625   First MD Initiated Contact with Patient 03/28/11 1718      Chief Complaint  Patient presents with  . Urinary Retention    (Consider location/radiation/quality/duration/timing/severity/associated sxs/prior treatment) HPI Kari Green is a 46 y.o. female who presents to the Emergency Department complaining of constant, moderate to severe lower abdominal pain pertaining to increased urinary retention with an onset yesterday. Pt last episode of urination was 12 hrs ago. Pt had hemorrhoid surgery last month for similar problems. Pt takes Benadryl, hydroxyzine, xanex, and estradiol, but hasn't taken meds since Thursday. No chest, back, neck, or extremity pain; no unusual swelling; no vaginal bleeding or d/c  Surgery ([facility])  Past Medical History  Diagnosis Date  . Anxiety   . Headache     followed by Dr. Modesto Charon.  has nerves burned in head and hips  . IC (interstitial cystitis)   . Cancer     melanoma  . Rectal bleeding   . Rectal pain   . Hyperlipidemia   . Osteoporosis   . Thyroid disease   . Abdominal pain   . Constipation   . Lymph node(s) enlarged   . Arthritis pain   . Irritable bowel syndrome (IBS) 02/26/2011  . PONV (postoperative nausea and vomiting)   . Blood transfusion   . Fibromyalgia     states followed by head/pain management  . Arthritis   . Chronic kidney disease     hx kidney stones  . Hemangioma, renal   . Hiatal hernia        ?reflux and ? hiatal hernia/ states has hemangioma on liver x 2-  . Sleep apnea     mild- no cpap    Past Surgical History  Procedure Date  . Achilles tendon repair 11/12  . Abdominal hysterectomy   . Cholecystectomy   . Tonsillectomy   . Cystoscopy w/ dilation of bladder   . Cystoscopy with  hydrodistension and biopsy   . Sp arthro elbow*l*     STATES CANNOT USE LEFT UPPER ARM FOR BLOOD PRESSURES  . Rt thumb   . Diagnostic laparoscopy   . Total thyroidectomy   . Appendectomy   . Left carpal tunnel      BILATERAL  . Cysto with hydrodistension 01/29/2011    Procedure: CYSTOSCOPY/HYDRODISTENSION;  Surgeon: Garnett Farm, MD;  Location: [facility];  Service: Urology;  Laterality: N/A;  . Cystoscopy w/ retrogrades 01/29/2011    Procedure: CYSTOSCOPY WITH RETROGRADE PYELOGRAM;  Surgeon: Garnett Farm, MD;  Location: [facility];  Service: Urology;  Laterality: Bilateral;  . Breast surgery     augmentation x2  . Joint replacement '05     rt knee surgeries x 9  . Fissurectomy     x 2  . Lasic     bilateral  . Nasal sinus surgery     Family History  Problem Relation Age of Onset  . Anesthesia problems Mother   . Cancer Mother     ovarian  . Cancer Daughter     cervical  . Cancer Maternal Aunt     breast    History  Substance Use Topics  . Smoking status: Former Smoker    Quit date: 01/25/2008  . Smokeless  tobacco: Never Used  . Alcohol Use: No    OB History    Grav Para Term Preterm Abortions TAB SAB Ect Mult Living                  Review of Systems 10 Systems reviewed and are negative for acute change except as noted in the HPI.  Allergies  Latex; Morphine and related; Prednisone; Red dye; Tape; and Yellow dyes (non-tartrazine)  Home Medications   Current Outpatient Rx  Name Route Sig Dispense Refill  . ALPRAZOLAM 0.5 MG PO TABS Oral Take 0.5 mg by mouth 3 (three) times daily as needed. Anxiety     . DIPHENHYDRAMINE HCL 50 MG PO TABS Oral Take 50 mg by mouth every 8 (eight) hours as needed. Allergies     . ESTRADIOL 0.5 MG PO TABS Oral Take 1 mg by mouth at bedtime.     Marland Kitchen HYDROMORPHONE HCL 2 MG PO TABS Oral Take 2 mg by mouth every 6 (six) hours as needed. Pain     . HYDROXYZINE PAMOATE 25 MG PO CAPS Oral Take 50  mg by mouth 3 (three) times daily as needed. Allergies      States alternates with Benadryl     . LEVOTHYROXINE SODIUM 150 MCG PO TABS Oral Take 150 mcg by mouth at bedtime.    . ADULT MULTIVITAMIN W/MINERALS CH Oral Take 1 tablet by mouth daily.      BP 127/91  Pulse 85  Temp 98.1 F (36.7 C)  Resp 16  SpO2 98%  Physical Exam  Nursing note and vitals reviewed. Constitutional: She appears well-developed and well-nourished.       Awake, alert, nontoxic appearance.  HENT:  Head: Normocephalic and atraumatic.  Eyes: EOM are normal. Pupils are equal, round, and reactive to light. Right eye exhibits no discharge. Left eye exhibits no discharge.  Neck: Normal range of motion. Neck supple.  Cardiovascular: Normal heart sounds.  Tachycardia present.   No murmur heard. Pulmonary/Chest: Effort normal and breath sounds normal. She exhibits no tenderness.  Abdominal: Soft. Bowel sounds are normal. There is no tenderness. There is no rebound.  Musculoskeletal: She exhibits no tenderness.       Baseline ROM, no obvious new focal weakness, no CVA tenderness.  Neurological:       Mental status and motor strength appears baseline for patient and situation.  Skin: Skin is warm. No rash noted.  Psychiatric: She has a normal mood and affect. Her behavior is normal.    ED Course  Procedures (including critical care time)  DIAGNOSTIC STUDIES:     COORDINATION OF CARE:  5:46PM - Urinary catheterization and urine tests will be performed and IV fluids will be given   Labs Reviewed  BASIC METABOLIC PANEL - Abnormal; Notable for the following:    Potassium 3.3 (*)    All other components within normal limits  URINALYSIS, WITH MICROSCOPIC - Abnormal; Notable for the following:    Color, Urine AMBER (*) BIOCHEMICALS MAY BE AFFECTED BY COLOR   Bilirubin Urine SMALL (*)    Ketones, ur 40 (*)    Bacteria, UA FEW (*)    All other components within normal limits  PREGNANCY, URINE   No results  found.   1. Urinary retention       MDM  46 year old female with urinary retention. His BladderScan available at this facility. On exam patient's bladder did not feel significantly distended. Foley catheter was inserted based on the symptoms though.  UA is not suggestive of infection. Urology followup. Renal Function is normal.  I personally preformed the services scribed in my presence. The recorded information has been reviewed and considered. Raeford Razor, MD.        Raeford Razor, MD 04/07/11 [phone]

## 2011-03-28 NOTE — ED Notes (Signed)
Bladder scan not available

## 2011-03-28 NOTE — ED Notes (Signed)
Pt notified RN staff that she wanted IV removed.  Informed pt that it would be better to leave IV indwelling until pt is up for discharge.  Pt then stated she "feels much better" and wants to go home.  Dr Juleen China informed.

## 2011-03-28 NOTE — ED Notes (Signed)
Paper tape used for IV.

## 2011-03-28 NOTE — ED Notes (Signed)
Foley catheter inserted is not latex-free; pt notified d/t hx of latex allergy; pt sts she feels comfortable leaving catheter in and being medicated with Benadryl- Dr. Juleen China notified- orders received

## 2011-03-29 DIAGNOSIS — R339 Retention of urine, unspecified: Secondary | ICD-10-CM | POA: Diagnosis not present

## 2011-03-29 NOTE — Anesthesia Postprocedure Evaluation (Signed)
  Anesthesia Post-op Note  Patient: Kari Green  Procedure(s) Performed:  TRANSANAL HEMORRHOIDAL DEARTERIALIZATION - Transanal Hemorrhoidal Deaterialization Ligation & Pexy; PEXY  Patient Location: PACU  Anesthesia Type: General  Level of Consciousness: awake and alert   Airway and Oxygen Therapy: Patient Spontanous Breathing  Post-op Pain: mild  Post-op Assessment: Post-op Vital signs reviewed, Patient's Cardiovascular Status Stable, Respiratory Function Stable, Patent Airway and No signs of Nausea or vomiting  Post-op Vital Signs: stable  Complications: No apparent anesthesia complications

## 2011-04-02 ENCOUNTER — Telehealth (INDEPENDENT_AMBULATORY_CARE_PROVIDER_SITE_OTHER): Payer: Self-pay

## 2011-04-02 NOTE — Telephone Encounter (Signed)
Called pt to make f/u appt with Dr Michaell Cowing. The pt told me she had problems urinating after she got home from surgery and she ended up going back to the ER the next day to get a catheter. The pt is going to her urologist today for f/u on the urine problem b/c still having issues with urinating. The pt also concerned about having a BM b/c still very loose and not formed. The pt is still on a liquid diet b/c afraid to have a BM. I advised pt to add to her diet with soft foods like rice,pasta,mashed potatos,and so on but to add the meats with bread last to her diet. I advised pt that she could add fiber to her diet after she gets more in her diet to try to bulk up her stools but the pt doesn't want to have a big BM yet. I advised pt as long as she continues to take the Miralax and she could add a stool softner if she wanted too to help with the BM's. I advised pt that if she did not feel any better by Monday to call me so I could get her seen by Dr Michaell Cowing. The pt is worried about a hemorrhoid being back there still b/c she feels something I told that it's normal to have swelling after surgery that the area looks worse before it looks better. The pt understands.

## 2011-04-08 DIAGNOSIS — J309 Allergic rhinitis, unspecified: Secondary | ICD-10-CM | POA: Diagnosis not present

## 2011-04-15 DIAGNOSIS — H9209 Otalgia, unspecified ear: Secondary | ICD-10-CM | POA: Diagnosis not present

## 2011-04-15 DIAGNOSIS — J3489 Other specified disorders of nose and nasal sinuses: Secondary | ICD-10-CM | POA: Diagnosis not present

## 2011-04-15 DIAGNOSIS — J342 Deviated nasal septum: Secondary | ICD-10-CM | POA: Diagnosis not present

## 2011-04-15 DIAGNOSIS — H608X9 Other otitis externa, unspecified ear: Secondary | ICD-10-CM | POA: Diagnosis not present

## 2011-04-15 DIAGNOSIS — J309 Allergic rhinitis, unspecified: Secondary | ICD-10-CM | POA: Diagnosis not present

## 2011-04-19 ENCOUNTER — Encounter (INDEPENDENT_AMBULATORY_CARE_PROVIDER_SITE_OTHER): Payer: Managed Care, Other (non HMO) | Admitting: Surgery

## 2011-04-19 ENCOUNTER — Telehealth (INDEPENDENT_AMBULATORY_CARE_PROVIDER_SITE_OTHER): Payer: Self-pay

## 2011-04-19 NOTE — Telephone Encounter (Signed)
The patient called because her appt was cancelled today.  She had hemorrhoid surgery and her bowel movements are very soft .  When she has a bowel movement she is still having some bleeding and I told her this is normal during her healing process.  She will await a call back to reschedule her appointment.

## 2011-04-20 DIAGNOSIS — J309 Allergic rhinitis, unspecified: Secondary | ICD-10-CM | POA: Diagnosis not present

## 2011-04-26 ENCOUNTER — Encounter (HOSPITAL_BASED_OUTPATIENT_CLINIC_OR_DEPARTMENT_OTHER): Payer: Self-pay | Admitting: *Deleted

## 2011-04-26 ENCOUNTER — Emergency Department (INDEPENDENT_AMBULATORY_CARE_PROVIDER_SITE_OTHER): Payer: Managed Care, Other (non HMO)

## 2011-04-26 ENCOUNTER — Emergency Department (HOSPITAL_BASED_OUTPATIENT_CLINIC_OR_DEPARTMENT_OTHER)
Admission: EM | Admit: 2011-04-26 | Discharge: 2011-04-26 | Disposition: A | Discharge status: Home / Self Care | Payer: Managed Care, Other (non HMO) | Attending: Emergency Medicine | Admitting: Emergency Medicine

## 2011-04-26 DIAGNOSIS — N201 Calculus of ureter: Secondary | ICD-10-CM | POA: Diagnosis not present

## 2011-04-26 DIAGNOSIS — N134 Hydroureter: Secondary | ICD-10-CM | POA: Diagnosis not present

## 2011-04-26 DIAGNOSIS — N1330 Unspecified hydronephrosis: Secondary | ICD-10-CM | POA: Insufficient documentation

## 2011-04-26 DIAGNOSIS — N189 Chronic kidney disease, unspecified: Secondary | ICD-10-CM | POA: Insufficient documentation

## 2011-04-26 DIAGNOSIS — K589 Irritable bowel syndrome without diarrhea: Secondary | ICD-10-CM | POA: Insufficient documentation

## 2011-04-26 DIAGNOSIS — R109 Unspecified abdominal pain: Secondary | ICD-10-CM | POA: Diagnosis not present

## 2011-04-26 DIAGNOSIS — Z79899 Other long term (current) drug therapy: Secondary | ICD-10-CM | POA: Insufficient documentation

## 2011-04-26 DIAGNOSIS — E079 Disorder of thyroid, unspecified: Secondary | ICD-10-CM | POA: Insufficient documentation

## 2011-04-26 DIAGNOSIS — R1032 Left lower quadrant pain: Secondary | ICD-10-CM | POA: Insufficient documentation

## 2011-04-26 DIAGNOSIS — E785 Hyperlipidemia, unspecified: Secondary | ICD-10-CM | POA: Insufficient documentation

## 2011-04-26 LAB — COMPREHENSIVE METABOLIC PANEL
ALT: 11 U/L (ref 0–35)
AST: 22 U/L (ref 0–37)
Albumin: 4.1 g/dL (ref 3.5–5.2)
Calcium: 10.3 mg/dL (ref 8.4–10.5)
GFR calc Af Amer: 89 mL/min — ABNORMAL LOW (ref >90)
Potassium: 3.6 mEq/L (ref 3.5–5.1)
Sodium: 141 mEq/L (ref 135–145)
Total Protein: 7.5 g/dL (ref 6.0–8.3)

## 2011-04-26 LAB — URINALYSIS, ROUTINE W REFLEX MICROSCOPIC
Ketones, ur: >80 mg/dL — AB
Leukocytes, UA: NEGATIVE
Nitrite: NEGATIVE
Protein, ur: 30 mg/dL — AB

## 2011-04-26 LAB — URINE MICROSCOPIC-ADD ON

## 2011-04-26 LAB — CBC
MCH: 29.1 pg (ref 26.0–34.0)
MCHC: 33.6 g/dL (ref 30.0–36.0)
Platelets: 213 10*3/uL (ref 150–400)
RDW: 13.6 % (ref 11.5–15.5)

## 2011-04-26 LAB — PREGNANCY, URINE: Preg Test, Ur: NEGATIVE

## 2011-04-26 MED ORDER — HYDROMORPHONE HCL PF 1 MG/ML IJ SOLN
1.0000 mg | Freq: Once | INTRAMUSCULAR | Status: AC
  Administered 2011-04-26: 1 mg via INTRAVENOUS
  Filled 2011-04-26: qty 1

## 2011-04-26 MED ORDER — ONDANSETRON HCL 4 MG/2ML IJ SOLN
4.0000 mg | Freq: Once | INTRAMUSCULAR | Status: AC
  Administered 2011-04-26: 4 mg via INTRAVENOUS
  Filled 2011-04-26: qty 2

## 2011-04-26 MED ORDER — PROMETHAZINE HCL 25 MG PO TABS: 25.0000 mg | ORAL_TABLET | Freq: Four times a day (QID) | ORAL | Status: DC | PRN

## 2011-04-26 MED ORDER — OXYCODONE-ACETAMINOPHEN 5-325 MG PO TABS: 1.0000 | ORAL_TABLET | ORAL | Status: DC | PRN

## 2011-04-26 MED ORDER — PROMETHAZINE HCL 25 MG/ML IJ SOLN
25.0000 mg | Freq: Once | INTRAMUSCULAR | Status: AC
  Administered 2011-04-26: 25 mg via INTRAMUSCULAR
  Filled 2011-04-26: qty 1

## 2011-04-26 NOTE — ED Provider Notes (Signed)
History     CSN: [id number]  Arrival date & time 04/26/11  0152   First MD Initiated Contact with Patient 04/26/11 0210      Chief Complaint  Patient presents with  . Abdominal Pain    (Consider location/radiation/quality/duration/timing/severity/associated sxs/prior treatment) HPI Patient presenting with acute onset of left lower abdominal pain. Pain is described as sharp and constant. She's had no fever or chills. She did have an episode of emesis prior to arrival. She denies any dysuria but states that it feels more difficult to urinate and usual. She's seen no blood in her urine. She states that her pain feels similar to prior kidney stones. There are no alleviating or modifying factors. There no other associated systemic symptoms.  Pain does have some radiation into left flank  Past Medical History  Diagnosis Date  . Anxiety   . Headache     followed by Dr. Modesto Charon.  has nerves burned in head and hips  . IC (interstitial cystitis)   . Cancer     melanoma  . Rectal bleeding   . Rectal pain   . Hyperlipidemia   . Osteoporosis   . Thyroid disease   . Abdominal pain   . Constipation   . Lymph node(s) enlarged   . Arthritis pain   . Irritable bowel syndrome (IBS) 02/26/2011  . PONV (postoperative nausea and vomiting)   . Blood transfusion   . Fibromyalgia     states followed by head/pain management  . Arthritis   . Chronic kidney disease     hx kidney stones  . Hemangioma, renal   . Hiatal hernia        ?reflux and ? hiatal hernia/ states has hemangioma on liver x 2-  . Sleep apnea     mild- no cpap    Past Surgical History  Procedure Date  . Achilles tendon repair 11/12  . Abdominal hysterectomy   . Cholecystectomy   . Tonsillectomy   . Cystoscopy w/ dilation of bladder   . Cystoscopy with hydrodistension and biopsy   . Sp arthro elbow*l*     STATES CANNOT USE LEFT UPPER ARM FOR BLOOD PRESSURES  . Rt thumb   . Diagnostic laparoscopy   . Total thyroidectomy     . Appendectomy   . Left carpal tunnel      BILATERAL  . Cysto with hydrodistension 01/29/2011    Procedure: CYSTOSCOPY/HYDRODISTENSION;  Surgeon: Garnett Farm, MD;  Location: [facility];  Service: Urology;  Laterality: N/A;  . Cystoscopy w/ retrogrades 01/29/2011    Procedure: CYSTOSCOPY WITH RETROGRADE PYELOGRAM;  Surgeon: Garnett Farm, MD;  Location: [facility];  Service: Urology;  Laterality: Bilateral;  . Breast surgery     augmentation x2  . Joint replacement '05     rt knee surgeries x 9  . Fissurectomy     x 2  . Lasic     bilateral  . Nasal sinus surgery     Family History  Problem Relation Age of Onset  . Anesthesia problems Mother   . Cancer Mother     ovarian  . Cancer Daughter     cervical  . Cancer Maternal Aunt     breast    History  Substance Use Topics  . Smoking status: Former Smoker    Quit date: 01/25/2008  . Smokeless tobacco: Never Used  . Alcohol Use: No    OB History    Grav Para Term Preterm  Abortions TAB SAB Ect Mult Living                  Review of Systems ROS reviewed and otherwise negative except for mentioned in HPI  Allergies  Latex; Morphine and related; Prednisone; Red dye; Tape; and Yellow dyes (non-tartrazine)  Home Medications   Current Outpatient Rx  Name Route Sig Dispense Refill  . ALPRAZOLAM 0.5 MG PO TABS Oral Take 0.5 mg by mouth 3 (three) times daily as needed. Anxiety     . DIPHENHYDRAMINE HCL 50 MG PO TABS Oral Take 50 mg by mouth every 8 (eight) hours as needed. Allergies     . ESTRADIOL 0.5 MG PO TABS Oral Take 1 mg by mouth at bedtime.     Marland Kitchen HYDROMORPHONE HCL 2 MG PO TABS Oral Take 2 mg by mouth every 6 (six) hours as needed. Pain     . HYDROXYZINE PAMOATE 25 MG PO CAPS Oral Take 50 mg by mouth 3 (three) times daily as needed. Allergies      States alternates with Benadryl     . LEVOTHYROXINE SODIUM 150 MCG PO TABS Oral Take 150 mcg by mouth at bedtime.    . ADULT  MULTIVITAMIN W/MINERALS CH Oral Take 1 tablet by mouth daily.    . OXYCODONE-ACETAMINOPHEN 5-325 MG PO TABS Oral Take 1-2 tablets by mouth every 4 (four) hours as needed for pain. 20 tablet 0  . PROMETHAZINE HCL 25 MG PO TABS Oral Take 1 tablet (25 mg total) by mouth every 6 (six) hours as needed for nausea. 15 tablet 0    BP 104/73  Pulse 86  Temp(Src) 97.8 F (36.6 C) (Oral)  Resp 18  SpO2 100% Vitals reviewed Physical Exam Physical Examination: General appearance - alert, uncomfortable appearing, and in no distress Mental status - alert, oriented to person, place, and time Mouth - mucous membranes moist, pharynx normal without lesions Chest - clear to auscultation, no wheezes, rales or rhonchi, symmetric air entry Heart - normal rate, regular rhythm, normal S1, S2, no murmurs, rubs, clicks or gallops Abdomen - soft, nontender, nondistended, no masses or organomegaly Back- no CVA tenderness Extremities - peripheral pulses normal, no pedal edema, no clubbing or cyanosis Skin - normal coloration and turgor, no rashes, brisk cap refill  ED Course  Procedures (including critical care time)  Labs Reviewed  URINALYSIS, ROUTINE W REFLEX MICROSCOPIC - Abnormal; Notable for the following:    APPearance CLOUDY (*)    Hgb urine dipstick LARGE (*)    Ketones, ur >80 (*)    Protein, ur 30 (*)    All other components within normal limits  CBC - Abnormal; Notable for the following:    RBC 5.26 (*)    Hemoglobin 15.3 (*)    All other components within normal limits  COMPREHENSIVE METABOLIC PANEL - Abnormal; Notable for the following:    CO2 18 (*)    Glucose, Bld 129 (*)    GFR calc non Af Amer 77 (*)    GFR calc Af Amer 89 (*)    All other components within normal limits  URINE MICROSCOPIC-ADD ON - Abnormal; Notable for the following:    Squamous Epithelial / LPF FEW (*)    Bacteria, UA FEW (*)    All other components within normal limits  PREGNANCY, URINE  LIPASE, BLOOD  URINE  CULTURE   Ct Abdomen Pelvis Wo Contrast  04/26/2011  *RADIOLOGY REPORT*  Clinical Data: Right side abdominal pain, right flank  pain, past history of interstitial cystitis, melanoma, irritable bowel syndrome, fibromyalgia, hysterectomy, cholecystectomy, thyroidectomy, kidney stones, hiatal hernia, renal hemangioma  CT ABDOMEN AND PELVIS WITHOUT CONTRAST  Technique:  Multidetector CT imaging of the abdomen and pelvis was performed following the standard protocol without intravenous contrast. Sagittal and coronal MPR images reconstructed from axial data set.  Comparison: 07/30/2008  Findings: Lung bases clear. Bilateral breast prostheses. Left hydronephrosis and hydroureter secondary to a 2 mm diameter distal left ureteral calculus just above ureterovesicle junction. Kidneys, ureters and bladder otherwise normal appearance. Within limitations imposed by lack of IV contrast, no significant abnormalities of the liver, spleen, pancreas or adrenal glands. Stomach and bowel loops unremarkable for technique. Surgical absence of uterus with nonvisualization of the ovaries and appendix. No mass, adenopathy or free fluid. Bones unremarkable.  IMPRESSION: Left hydronephrosis and hydroureter secondary to a 2 mm diameter distal left ureteral calculus.  Original Report Authenticated By: Lollie Marrow, M.D.     1. Ureteral stone       MDM  Patient presenting with acute onset of left lower abdominal pain. Her workup today in the emergency department revealed hematuria and 2 mm ureteral stone on CT scan. Her renal function was normal. Her pain was much improved after time allotted and antibiotics. She had no further vomiting while in the emergency department. She was discharged with pain control and advised to arrange for outpatient followup with urology. She was given strict return precautions and was agreeable with the plan for discharge.        Ethelda Chick, MD 04/29/11 0930

## 2011-04-26 NOTE — Discharge Instructions (Signed)
Return to the ED with any concerns including vomiting and not able to keep down liquids or medications, fever over 101, worsening pain that is not controlled by pain medications by mouth, decreased level of alertness or lethargy, or any other alarming symptoms.  You should be sure to call for a follow up appointment with [facility] at the number provided

## 2011-04-26 NOTE — ED Notes (Signed)
appx 12am patient began having abd pain in her right side and right lower back. N/V. States she is sweating also.

## 2011-04-27 LAB — URINE CULTURE
Colony Count: NO GROWTH
Culture  Setup Time: 201303041026

## 2011-04-28 ENCOUNTER — Encounter (HOSPITAL_BASED_OUTPATIENT_CLINIC_OR_DEPARTMENT_OTHER): Payer: Self-pay | Admitting: *Deleted

## 2011-04-28 ENCOUNTER — Emergency Department (HOSPITAL_BASED_OUTPATIENT_CLINIC_OR_DEPARTMENT_OTHER)
Admission: EM | Admit: 2011-04-28 | Discharge: 2011-04-28 | Disposition: A | Discharge status: Home / Self Care | Payer: Managed Care, Other (non HMO) | Attending: Emergency Medicine | Admitting: Emergency Medicine

## 2011-04-28 DIAGNOSIS — G4730 Sleep apnea, unspecified: Secondary | ICD-10-CM | POA: Insufficient documentation

## 2011-04-28 DIAGNOSIS — R109 Unspecified abdominal pain: Secondary | ICD-10-CM | POA: Insufficient documentation

## 2011-04-28 DIAGNOSIS — K589 Irritable bowel syndrome without diarrhea: Secondary | ICD-10-CM | POA: Insufficient documentation

## 2011-04-28 DIAGNOSIS — N200 Calculus of kidney: Secondary | ICD-10-CM

## 2011-04-28 DIAGNOSIS — IMO0001 Reserved for inherently not codable concepts without codable children: Secondary | ICD-10-CM | POA: Insufficient documentation

## 2011-04-28 DIAGNOSIS — M810 Age-related osteoporosis without current pathological fracture: Secondary | ICD-10-CM | POA: Insufficient documentation

## 2011-04-28 MED ORDER — KETOROLAC TROMETHAMINE 10 MG PO TABS: 10.0000 mg | ORAL_TABLET | Freq: Four times a day (QID) | ORAL | Status: AC | PRN

## 2011-04-28 MED ORDER — HYDROMORPHONE HCL PF 2 MG/ML IJ SOLN
2.0000 mg | Freq: Once | INTRAMUSCULAR | Status: AC
  Administered 2011-04-28: 2 mg via INTRAMUSCULAR
  Filled 2011-04-28: qty 1

## 2011-04-28 MED ORDER — PROMETHAZINE HCL 25 MG RE SUPP: 25.0000 mg | Freq: Four times a day (QID) | RECTAL | Status: DC | PRN

## 2011-04-28 MED ORDER — DIPHENHYDRAMINE HCL 50 MG PO CAPS
50.0000 mg | ORAL_CAPSULE | Freq: Once | ORAL | Status: DC
  Filled 2011-04-28: qty 1

## 2011-04-28 MED ORDER — KETOROLAC TROMETHAMINE 60 MG/2ML IM SOLN
60.0000 mg | Freq: Once | INTRAMUSCULAR | Status: AC
  Administered 2011-04-28: 60 mg via INTRAMUSCULAR
  Filled 2011-04-28: qty 2

## 2011-04-28 MED ORDER — DIPHENHYDRAMINE HCL 50 MG/ML IJ SOLN
INTRAMUSCULAR | Status: AC
  Administered 2011-04-28: 50 mg
  Filled 2011-04-28: qty 1

## 2011-04-28 MED ORDER — DIPHENHYDRAMINE HCL 25 MG PO CAPS
ORAL_CAPSULE | ORAL | Status: AC
  Filled 2011-04-28: qty 2

## 2011-04-28 MED ORDER — ONDANSETRON HCL 4 MG/2ML IJ SOLN
4.0000 mg | Freq: Once | INTRAMUSCULAR | Status: AC
  Administered 2011-04-28: 4 mg via INTRAMUSCULAR
  Filled 2011-04-28: qty 2

## 2011-04-28 NOTE — ED Provider Notes (Signed)
History     CSN: [id number]  Arrival date & time 04/28/11  1400   First MD Initiated Contact with Patient 04/28/11 1428      Chief Complaint  Patient presents with  . Flank Pain    (Consider location/radiation/quality/duration/timing/severity/associated sxs/prior treatment) Patient is a 46 y.o. female presenting with flank pain. The history is provided by the patient. No language interpreter was used.  Flank Pain This is a new problem. The current episode started in the past 7 days. The problem occurs constantly. The problem has been gradually worsening. Associated symptoms include abdominal pain. The symptoms are aggravated by nothing. She has tried oral narcotics for the symptoms. The treatment provided no relief.  Pt reports severe flank and abdominal pain today.  Pt was seen here 2 days ago and diagnosed with a stone at [facility].  Pt reports no relief at home from stone.  Pt reports urologist can not see her until april  Past Medical History  Diagnosis Date  . Anxiety   . Headache     followed by Dr. Modesto Charon.  has nerves burned in head and hips  . IC (interstitial cystitis)   . Cancer     melanoma  . Rectal bleeding   . Rectal pain   . Hyperlipidemia   . Osteoporosis   . Thyroid disease   . Abdominal pain   . Constipation   . Lymph node(s) enlarged   . Arthritis pain   . Irritable bowel syndrome (IBS) 02/26/2011  . PONV (postoperative nausea and vomiting)   . Blood transfusion   . Fibromyalgia     states followed by head/pain management  . Arthritis   . Chronic kidney disease     hx kidney stones  . Hemangioma, renal   . Hiatal hernia        ?reflux and ? hiatal hernia/ states has hemangioma on liver x 2-  . Sleep apnea     mild- no cpap    Past Surgical History  Procedure Date  . Achilles tendon repair 11/12  . Abdominal hysterectomy   . Cholecystectomy   . Tonsillectomy   . Cystoscopy w/ dilation of bladder   . Cystoscopy with hydrodistension and biopsy   . Sp  arthro elbow*l*     STATES CANNOT USE LEFT UPPER ARM FOR BLOOD PRESSURES  . Rt thumb   . Diagnostic laparoscopy   . Total thyroidectomy   . Appendectomy   . Left carpal tunnel      BILATERAL  . Cysto with hydrodistension 01/29/2011    Procedure: CYSTOSCOPY/HYDRODISTENSION;  Surgeon: Garnett Farm, MD;  Location: [facility];  Service: Urology;  Laterality: N/A;  . Cystoscopy w/ retrogrades 01/29/2011    Procedure: CYSTOSCOPY WITH RETROGRADE PYELOGRAM;  Surgeon: Garnett Farm, MD;  Location: [facility];  Service: Urology;  Laterality: Bilateral;  . Breast surgery     augmentation x2  . Joint replacement '05     rt knee surgeries x 9  . Fissurectomy     x 2  . Lasic     bilateral  . Nasal sinus surgery     Family History  Problem Relation Age of Onset  . Anesthesia problems Mother   . Cancer Mother     ovarian  . Cancer Daughter     cervical  . Cancer Maternal Aunt     breast    History  Substance Use Topics  . Smoking status: Former Smoker  Quit date: 01/25/2008  . Smokeless tobacco: Never Used  . Alcohol Use: No    OB History    Grav Para Term Preterm Abortions TAB SAB Ect Mult Living                  Review of Systems  Gastrointestinal: Positive for abdominal pain.  Genitourinary: Positive for flank pain.  All other systems reviewed and are negative.    Allergies  Latex; Morphine and related; Prednisone; Red dye; Tape; and Yellow dyes (non-tartrazine)  Home Medications   Current Outpatient Rx  Name Route Sig Dispense Refill  . ALPRAZOLAM 0.5 MG PO TABS Oral Take 0.5 mg by mouth 3 (three) times daily as needed. Anxiety     . DIPHENHYDRAMINE HCL 50 MG PO TABS Oral Take 50 mg by mouth every 8 (eight) hours as needed. Allergies     . ESTRADIOL 0.5 MG PO TABS Oral Take 1 mg by mouth at bedtime.     Marland Kitchen HYDROMORPHONE HCL 2 MG PO TABS Oral Take 2 mg by mouth every 6 (six) hours as needed. Pain     . LEVOTHYROXINE SODIUM 150  MCG PO TABS Oral Take 150 mcg by mouth at bedtime.    . ADULT MULTIVITAMIN W/MINERALS CH Oral Take 1 tablet by mouth daily.    Marland Kitchen PROMETHAZINE HCL 25 MG PO TABS Oral Take 1 tablet (25 mg total) by mouth every 6 (six) hours as needed for nausea. 15 tablet 0  . HYDROXYZINE PAMOATE 25 MG PO CAPS Oral Take 50 mg by mouth 3 (three) times daily as needed. Allergies      States alternates with Benadryl       BP 164/97  Pulse 95  Temp(Src) 98 F (36.7 C) (Oral)  Resp 16  SpO2 100%  Physical Exam  Nursing note and vitals reviewed. Constitutional: She appears well-developed.  HENT:  Head: Normocephalic.  Neck: Normal range of motion.  Cardiovascular: Normal rate.   Pulmonary/Chest: Effort normal.  Abdominal: Soft.  Musculoskeletal: Normal range of motion.  Neurological: She is alert.  Skin: Skin is warm.  Psychiatric: She has a normal mood and affect.    ED Course  Procedures (including critical care time)  Labs Reviewed - No data to display No results found.   No diagnosis found.    MDM  Pt given dilaudid, torodol, zofran and benadryl for itching.   Pt passed hard stone like material while going to bathroom.  (i advised pt I will add torodol and flomax to treatment.)  Pt does not want to see [facility].  I advised [facility] or [facility].        Langston Masker, [place] 04/28/11 1624

## 2011-04-28 NOTE — ED Notes (Signed)
Pt ambulated to restroom and back to stretcher. Husband at bedside.

## 2011-04-28 NOTE — ED Notes (Signed)
Pt states she didn't get prescription for percocet when she was seen

## 2011-04-28 NOTE — ED Notes (Signed)
Pt seen here 2 days ago for same, dx kidney stone  has not seen urology. Pt states demerol pills at home are not working

## 2011-04-28 NOTE — Discharge Instructions (Signed)
Kidney Stones Kidney stones (ureteral lithiasis) are solid masses that form inside your kidneys. The intense pain is caused by the stone moving through the kidney, ureter, bladder, and urethra (urinary tract). When the stone moves, the ureter starts to spasm around the stone. The stone is usually passed in the urine.  HOME CARE  Drink enough fluids to keep your pee (urine) clear or pale yellow. This helps to get the stone out.   Strain all pee through the provided strainer. Do not pee without peeing through the strainer, not even once. If you pee the stone out, catch it. The stone may be as small as a grain of salt. Take this to your doctor.   Only take medicine as told by your doctor.   Follow up with your doctor as told.   Get follow-up X-rays as told by your doctor.  GET HELP RIGHT AWAY IF:   Your pain does not get better with medicine.   You have a fever.   Your pain increases and gets worse over 18 hours.   You have new belly (abdominal) pain.   You feel faint or pass out.  MAKE SURE YOU:   Understand these instructions.   Will watch your condition.   Will get help right away if you are not doing well or get worse.  Document Released: 07/28/2007 Document Revised: 01/28/2011 Document Reviewed: 12/06/2008 [employer] Patient Information 2012 [employer]. 

## 2011-04-30 DIAGNOSIS — J309 Allergic rhinitis, unspecified: Secondary | ICD-10-CM | POA: Diagnosis not present

## 2011-04-30 NOTE — ED Provider Notes (Signed)
Medical screening examination/treatment/procedure(s) were performed by non-physician practitioner and as supervising physician I was immediately available for consultation/collaboration.   Gwyneth Sprout, MD 04/30/11 [phone]

## 2011-05-05 DIAGNOSIS — J309 Allergic rhinitis, unspecified: Secondary | ICD-10-CM | POA: Diagnosis not present

## 2011-05-10 ENCOUNTER — Ambulatory Visit (INDEPENDENT_AMBULATORY_CARE_PROVIDER_SITE_OTHER): Payer: Managed Care, Other (non HMO) | Admitting: Surgery

## 2011-05-10 ENCOUNTER — Encounter (INDEPENDENT_AMBULATORY_CARE_PROVIDER_SITE_OTHER): Payer: Self-pay | Admitting: Surgery

## 2011-05-10 VITALS — BP 130/86 | HR 80 | Temp 97.0°F | Resp 16 | Ht 63.0 in | Wt 128.4 lb

## 2011-05-10 DIAGNOSIS — K589 Irritable bowel syndrome without diarrhea: Secondary | ICD-10-CM

## 2011-05-10 DIAGNOSIS — K648 Other hemorrhoids: Secondary | ICD-10-CM

## 2011-05-10 DIAGNOSIS — J309 Allergic rhinitis, unspecified: Secondary | ICD-10-CM | POA: Diagnosis not present

## 2011-05-10 DIAGNOSIS — K581 Irritable bowel syndrome with constipation: Secondary | ICD-10-CM

## 2011-05-10 NOTE — Progress Notes (Signed)
Subjective:     Patient ID: Kari Green, female   DOB: 08/02/1965, 46 y.o.   MRN: [id number]  HPI  Kari Green  1965-04-01 [id number]  Patient Care Team: Thayer Headings, MD as PCP - General (Internal Medicine) Barrie Folk, MD as Consulting Physician (Gastroenterology) Garnett Farm, MD as Consulting Physician (Urology)  This patient is a 46 y.o.female who presents today for surgical evaluation.   Diagnosis: Bleeding hemorrhoids with prolapse  Procedure THD hemorrhoidal ligation and hemorrhoidal pexy 03/26/2011  The patient feels much better overall. Still struggles with her IBS but much E. she remains. Instead of severe constipation now slightly loose. 1-3 bowel movements a day. He had one episode of bright red blood last week but faded away. Pain has gone away. She did struggle with urinary retention. She said she could not feel the but the urge to urinate but that started to resolve. Passed a kidney stone as well.  Now feeling better. No itching. Does have chronically dry skin but that is not an issue. Energy level good. Good  Patient Active Problem List  Diagnoses  . Hemorrhoids, internal, with bleeding & prolapse  . Irritable bowel syndrome with constipation    Past Medical History  Diagnosis Date  . Anxiety   . Headache     followed by Dr. Modesto Charon.  has nerves burned in head and hips  . IC (interstitial cystitis)   . Cancer     melanoma  . Rectal bleeding   . Rectal pain   . Hyperlipidemia   . Osteoporosis   . Thyroid disease   . Abdominal pain   . Constipation   . Lymph node(s) enlarged   . Arthritis pain   . Irritable bowel syndrome (IBS) 02/26/2011  . PONV (postoperative nausea and vomiting)   . Blood transfusion   . Fibromyalgia     states followed by head/pain management  . Arthritis   . Chronic kidney disease     hx kidney stones  . Hemangioma, renal   . Hiatal hernia        ?reflux and ? hiatal hernia/ states has hemangioma on liver x 2-  .  Sleep apnea     mild- no cpap    Past Surgical History  Procedure Date  . Achilles tendon repair 11/12  . Abdominal hysterectomy   . Cholecystectomy   . Tonsillectomy   . Cystoscopy w/ dilation of bladder   . Cystoscopy with hydrodistension and biopsy   . Sp arthro elbow*l*     STATES CANNOT USE LEFT UPPER ARM FOR BLOOD PRESSURES  . Rt thumb   . Diagnostic laparoscopy   . Total thyroidectomy   . Appendectomy   . Left carpal tunnel      BILATERAL  . Cysto with hydrodistension 01/29/2011    Procedure: CYSTOSCOPY/HYDRODISTENSION;  Surgeon: Garnett Farm, MD;  Location: [facility];  Service: Urology;  Laterality: N/A;  . Cystoscopy w/ retrogrades 01/29/2011    Procedure: CYSTOSCOPY WITH RETROGRADE PYELOGRAM;  Surgeon: Garnett Farm, MD;  Location: [facility];  Service: Urology;  Laterality: Bilateral;  . Breast surgery     augmentation x2  . Joint replacement '05     rt knee surgeries x 9  . Fissurectomy     x 2  . Lasic     bilateral  . Nasal sinus surgery     History   Social History  . Marital Status: Married    Spouse  Name: N/A    Number of Children: N/A  . Years of Education: N/A   Occupational History  . Not on file.   Social History Main Topics  . Smoking status: Former Smoker    Quit date: 01/25/2008  . Smokeless tobacco: Never Used  . Alcohol Use: No  . Drug Use: No  . Sexually Active:    Other Topics Concern  . Not on file   Social History Narrative  . No narrative on file    Family History  Problem Relation Age of Onset  . Anesthesia problems Mother   . Cancer Mother     ovarian  . Cancer Daughter     cervical  . Cancer Maternal Aunt     breast    Current outpatient prescriptions:ALPRAZolam (XANAX) 0.5 MG tablet, Take 0.5 mg by mouth 3 (three) times daily as needed. Anxiety , Disp: , Rfl: ;  diphenhydrAMINE (BENADRYL) 50 MG tablet, Take 50 mg by mouth every 8 (eight) hours as needed. Allergies , Disp: ,  Rfl: ;  estradiol (ESTRACE) 0.5 MG tablet, Take 1 mg by mouth at bedtime. , Disp: , Rfl:  HYDROmorphone (DILAUDID) 2 MG tablet, Take 2 mg by mouth every 6 (six) hours as needed. Pain , Disp: , Rfl: ;  hydrOXYzine (VISTARIL) 25 MG capsule, Take 50 mg by mouth 3 (three) times daily as needed. Allergies      States alternates with Benadryl , Disp: , Rfl: ;  levothyroxine (SYNTHROID, LEVOTHROID) 150 MCG tablet, Take 150 mcg by mouth at bedtime., Disp: , Rfl:  Multiple Vitamin (MULITIVITAMIN WITH MINERALS) TABS, Take 1 tablet by mouth daily., Disp: , Rfl:   Allergies  Allergen Reactions  . Latex Swelling  . Morphine And Related Itching    States all pain meds cause itching/ STATES HAS TO PREMED WITH BENADRYL  . Prednisone     Yellow 5 Red 4  . Red Dye Hives  . Tape Other (See Comments)    Blisters skin-USE PAPER TAPE  And OP SITE OK  . Yellow Dyes (Non-Tartrazine) Swelling    BP 130/86  Pulse 80  Temp(Src) 97 F (36.1 C) (Temporal)  Resp 16  Ht 5\' 3"  (1.6 m)  Wt 128 lb 6.4 oz (58.242 kg)  BMI 22.75 kg/m2     Review of Systems  Constitutional: Negative for fever, chills and diaphoresis.  HENT: Negative for ear pain, sore throat and trouble swallowing.   Eyes: Negative for photophobia and visual disturbance.  Respiratory: Negative for cough and choking.   Cardiovascular: Negative for chest pain and palpitations.  Gastrointestinal: Negative for nausea, vomiting, abdominal pain, diarrhea, constipation, anal bleeding and rectal pain.  Genitourinary: Negative for dysuria, frequency and difficulty urinating.  Musculoskeletal: Negative for myalgias and gait problem.  Skin: Negative for color change, pallor and rash.  Neurological: Negative for dizziness, speech difficulty, weakness and numbness.  Hematological: Negative for adenopathy.  Psychiatric/Behavioral: Negative for confusion and agitation. The patient is not nervous/anxious.        Objective:   Physical Exam    Constitutional: She is oriented to person, place, and time. She appears well-developed and well-nourished. No distress.  HENT:  Head: Normocephalic.  Mouth/Throat: Oropharynx is clear and moist. No oropharyngeal exudate.  Eyes: Conjunctivae and EOM are normal. Pupils are equal, round, and reactive to light. No scleral icterus.  Neck: Normal range of motion. No tracheal deviation present.  Cardiovascular: Normal rate and intact distal pulses.   Pulmonary/Chest: Effort normal. No respiratory  distress. She exhibits no tenderness.  Abdominal: Soft. She exhibits no distension. There is no tenderness. Hernia confirmed negative in the right inguinal area and confirmed negative in the left inguinal area.       Incisions clean with normal healing ridges.  No hernias  Genitourinary: No vaginal discharge found.       Perianal skin clean with good hygiene.  No pruritis.  No pilonidal disease.  No fissure.  No abscess/fistula.   Normal sphincter tone.  No ext skin tags    Musculoskeletal: Normal range of motion. She exhibits no tenderness.  Lymphadenopathy:       Right: No inguinal adenopathy present.       Left: No inguinal adenopathy present.  Neurological: She is alert and oriented to person, place, and time. No cranial nerve deficit. She exhibits normal muscle tone. Coordination normal.  Skin: Skin is dry. No rash noted. She is not diaphoretic.       Dry, flaky skin  Psychiatric: She has a normal mood and affect. Her behavior is normal.       Assessment:     Much improved after THD of hemorrhoids  Dry skin    Plan:     Increase activity as tolerated.  Do not push through pain.  Advanced on diet as tolerated. Bowel regimen to avoid problems.  Controlling IBS best way to prevent further problems  Try Eucerin thick cream to help moisturize skin  Return to clinic p.r.n. The patient expressed understanding and appreciation

## 2011-05-10 NOTE — Patient Instructions (Signed)

## 2011-05-11 DIAGNOSIS — R269 Unspecified abnormalities of gait and mobility: Secondary | ICD-10-CM | POA: Diagnosis not present

## 2011-05-11 DIAGNOSIS — Z9181 History of falling: Secondary | ICD-10-CM | POA: Diagnosis not present

## 2011-05-11 DIAGNOSIS — M545 Low back pain, unspecified: Secondary | ICD-10-CM | POA: Diagnosis not present

## 2011-05-11 DIAGNOSIS — M542 Cervicalgia: Secondary | ICD-10-CM | POA: Diagnosis not present

## 2011-05-11 DIAGNOSIS — Z79899 Other long term (current) drug therapy: Secondary | ICD-10-CM | POA: Diagnosis not present

## 2011-05-11 DIAGNOSIS — G541 Lumbosacral plexus disorders: Secondary | ICD-10-CM | POA: Diagnosis not present

## 2011-05-11 DIAGNOSIS — M5430 Sciatica, unspecified side: Secondary | ICD-10-CM | POA: Diagnosis not present

## 2011-05-19 ENCOUNTER — Other Ambulatory Visit: Payer: Self-pay | Admitting: Dermatology

## 2011-05-19 DIAGNOSIS — C44519 Basal cell carcinoma of skin of other part of trunk: Secondary | ICD-10-CM | POA: Diagnosis not present

## 2011-05-25 DIAGNOSIS — J309 Allergic rhinitis, unspecified: Secondary | ICD-10-CM | POA: Diagnosis not present

## 2011-05-26 ENCOUNTER — Other Ambulatory Visit: Payer: Self-pay | Admitting: Internal Medicine

## 2011-05-26 DIAGNOSIS — Z1231 Encounter for screening mammogram for malignant neoplasm of breast: Secondary | ICD-10-CM

## 2011-06-01 DIAGNOSIS — J309 Allergic rhinitis, unspecified: Secondary | ICD-10-CM | POA: Diagnosis not present

## 2011-06-08 DIAGNOSIS — J309 Allergic rhinitis, unspecified: Secondary | ICD-10-CM | POA: Diagnosis not present

## 2011-06-18 DIAGNOSIS — J309 Allergic rhinitis, unspecified: Secondary | ICD-10-CM | POA: Diagnosis not present

## 2011-06-22 DIAGNOSIS — J309 Allergic rhinitis, unspecified: Secondary | ICD-10-CM | POA: Diagnosis not present

## 2011-06-23 DIAGNOSIS — S30860A Insect bite (nonvenomous) of lower back and pelvis, initial encounter: Secondary | ICD-10-CM | POA: Diagnosis not present

## 2011-06-23 DIAGNOSIS — R509 Fever, unspecified: Secondary | ICD-10-CM | POA: Diagnosis not present

## 2011-06-23 DIAGNOSIS — W57XXXA Bitten or stung by nonvenomous insect and other nonvenomous arthropods, initial encounter: Secondary | ICD-10-CM | POA: Diagnosis not present

## 2011-06-30 DIAGNOSIS — IMO0001 Reserved for inherently not codable concepts without codable children: Secondary | ICD-10-CM | POA: Diagnosis not present

## 2011-06-30 DIAGNOSIS — R509 Fever, unspecified: Secondary | ICD-10-CM | POA: Diagnosis not present

## 2011-06-30 DIAGNOSIS — R112 Nausea with vomiting, unspecified: Secondary | ICD-10-CM | POA: Diagnosis not present

## 2011-06-30 DIAGNOSIS — R21 Rash and other nonspecific skin eruption: Secondary | ICD-10-CM | POA: Diagnosis not present

## 2011-07-01 ENCOUNTER — Ambulatory Visit
Admission: RE | Admit: 2011-07-01 | Discharge: 2011-07-01 | Disposition: A | Discharge status: Home / Self Care | Payer: Managed Care, Other (non HMO) | Source: Ambulatory Visit | Attending: Internal Medicine | Admitting: Internal Medicine

## 2011-07-01 DIAGNOSIS — J309 Allergic rhinitis, unspecified: Secondary | ICD-10-CM | POA: Diagnosis not present

## 2011-07-01 DIAGNOSIS — Z1231 Encounter for screening mammogram for malignant neoplasm of breast: Secondary | ICD-10-CM

## 2011-07-02 DIAGNOSIS — J309 Allergic rhinitis, unspecified: Secondary | ICD-10-CM | POA: Diagnosis not present

## 2011-07-05 DIAGNOSIS — J309 Allergic rhinitis, unspecified: Secondary | ICD-10-CM | POA: Diagnosis not present

## 2011-07-08 DIAGNOSIS — M542 Cervicalgia: Secondary | ICD-10-CM | POA: Diagnosis not present

## 2011-07-08 DIAGNOSIS — M545 Low back pain, unspecified: Secondary | ICD-10-CM | POA: Diagnosis not present

## 2011-07-08 DIAGNOSIS — G541 Lumbosacral plexus disorders: Secondary | ICD-10-CM | POA: Diagnosis not present

## 2011-07-08 DIAGNOSIS — M5430 Sciatica, unspecified side: Secondary | ICD-10-CM | POA: Diagnosis not present

## 2011-07-09 DIAGNOSIS — IMO0001 Reserved for inherently not codable concepts without codable children: Secondary | ICD-10-CM | POA: Diagnosis not present

## 2011-07-12 DIAGNOSIS — J309 Allergic rhinitis, unspecified: Secondary | ICD-10-CM | POA: Diagnosis not present

## 2011-07-20 DIAGNOSIS — J309 Allergic rhinitis, unspecified: Secondary | ICD-10-CM | POA: Diagnosis not present

## 2011-07-22 DIAGNOSIS — F411 Generalized anxiety disorder: Secondary | ICD-10-CM | POA: Diagnosis not present

## 2011-07-22 DIAGNOSIS — E039 Hypothyroidism, unspecified: Secondary | ICD-10-CM | POA: Diagnosis not present

## 2011-07-22 DIAGNOSIS — E785 Hyperlipidemia, unspecified: Secondary | ICD-10-CM | POA: Diagnosis not present

## 2011-07-22 DIAGNOSIS — R1013 Epigastric pain: Secondary | ICD-10-CM | POA: Diagnosis not present

## 2011-07-28 DIAGNOSIS — J309 Allergic rhinitis, unspecified: Secondary | ICD-10-CM | POA: Diagnosis not present

## 2011-08-05 DIAGNOSIS — J309 Allergic rhinitis, unspecified: Secondary | ICD-10-CM | POA: Diagnosis not present

## 2011-08-10 DIAGNOSIS — J309 Allergic rhinitis, unspecified: Secondary | ICD-10-CM | POA: Diagnosis not present

## 2011-08-16 DIAGNOSIS — Z86010 Personal history of colonic polyps: Secondary | ICD-10-CM | POA: Diagnosis not present

## 2011-08-16 DIAGNOSIS — J309 Allergic rhinitis, unspecified: Secondary | ICD-10-CM | POA: Diagnosis not present

## 2011-08-16 DIAGNOSIS — R1314 Dysphagia, pharyngoesophageal phase: Secondary | ICD-10-CM | POA: Diagnosis not present

## 2011-08-16 DIAGNOSIS — R1013 Epigastric pain: Secondary | ICD-10-CM | POA: Diagnosis not present

## 2011-08-16 DIAGNOSIS — K921 Melena: Secondary | ICD-10-CM | POA: Diagnosis not present

## 2011-08-23 DIAGNOSIS — J309 Allergic rhinitis, unspecified: Secondary | ICD-10-CM | POA: Diagnosis not present

## 2011-08-27 DIAGNOSIS — M545 Low back pain, unspecified: Secondary | ICD-10-CM | POA: Diagnosis not present

## 2011-08-27 DIAGNOSIS — G541 Lumbosacral plexus disorders: Secondary | ICD-10-CM | POA: Diagnosis not present

## 2011-08-27 DIAGNOSIS — M5430 Sciatica, unspecified side: Secondary | ICD-10-CM | POA: Diagnosis not present

## 2011-08-27 DIAGNOSIS — M542 Cervicalgia: Secondary | ICD-10-CM | POA: Diagnosis not present

## 2011-08-31 DIAGNOSIS — H60399 Other infective otitis externa, unspecified ear: Secondary | ICD-10-CM | POA: Diagnosis not present

## 2011-08-31 DIAGNOSIS — R229 Localized swelling, mass and lump, unspecified: Secondary | ICD-10-CM | POA: Diagnosis not present

## 2011-08-31 DIAGNOSIS — E039 Hypothyroidism, unspecified: Secondary | ICD-10-CM | POA: Diagnosis not present

## 2011-09-01 DIAGNOSIS — J309 Allergic rhinitis, unspecified: Secondary | ICD-10-CM | POA: Diagnosis not present

## 2011-09-07 DIAGNOSIS — J309 Allergic rhinitis, unspecified: Secondary | ICD-10-CM | POA: Diagnosis not present

## 2011-09-08 ENCOUNTER — Other Ambulatory Visit: Payer: Self-pay | Admitting: Gastroenterology

## 2011-09-08 DIAGNOSIS — Z86010 Personal history of colonic polyps: Secondary | ICD-10-CM | POA: Diagnosis not present

## 2011-09-08 DIAGNOSIS — R1013 Epigastric pain: Secondary | ICD-10-CM | POA: Diagnosis not present

## 2011-09-08 DIAGNOSIS — K6389 Other specified diseases of intestine: Secondary | ICD-10-CM | POA: Diagnosis not present

## 2011-09-08 DIAGNOSIS — K6289 Other specified diseases of anus and rectum: Secondary | ICD-10-CM | POA: Diagnosis not present

## 2011-09-08 DIAGNOSIS — K2970 Gastritis, unspecified, without bleeding: Secondary | ICD-10-CM | POA: Diagnosis not present

## 2011-09-08 DIAGNOSIS — R1310 Dysphagia, unspecified: Secondary | ICD-10-CM | POA: Diagnosis not present

## 2011-09-08 DIAGNOSIS — K2990 Gastroduodenitis, unspecified, without bleeding: Secondary | ICD-10-CM | POA: Diagnosis not present

## 2011-09-08 DIAGNOSIS — K921 Melena: Secondary | ICD-10-CM | POA: Diagnosis not present

## 2011-09-15 DIAGNOSIS — J309 Allergic rhinitis, unspecified: Secondary | ICD-10-CM | POA: Diagnosis not present

## 2011-09-15 DIAGNOSIS — M25569 Pain in unspecified knee: Secondary | ICD-10-CM | POA: Diagnosis not present

## 2011-09-15 DIAGNOSIS — S838X9A Sprain of other specified parts of unspecified knee, initial encounter: Secondary | ICD-10-CM | POA: Diagnosis not present

## 2011-09-15 DIAGNOSIS — G5600 Carpal tunnel syndrome, unspecified upper limb: Secondary | ICD-10-CM | POA: Diagnosis not present

## 2011-09-15 DIAGNOSIS — M25539 Pain in unspecified wrist: Secondary | ICD-10-CM | POA: Diagnosis not present

## 2011-09-15 DIAGNOSIS — S86819A Strain of other muscle(s) and tendon(s) at lower leg level, unspecified leg, initial encounter: Secondary | ICD-10-CM | POA: Diagnosis not present

## 2011-09-16 DIAGNOSIS — J309 Allergic rhinitis, unspecified: Secondary | ICD-10-CM | POA: Diagnosis not present

## 2011-09-16 DIAGNOSIS — L259 Unspecified contact dermatitis, unspecified cause: Secondary | ICD-10-CM | POA: Diagnosis not present

## 2011-09-16 DIAGNOSIS — D239 Other benign neoplasm of skin, unspecified: Secondary | ICD-10-CM | POA: Diagnosis not present

## 2011-09-21 DIAGNOSIS — J309 Allergic rhinitis, unspecified: Secondary | ICD-10-CM | POA: Diagnosis not present

## 2011-09-21 DIAGNOSIS — R599 Enlarged lymph nodes, unspecified: Secondary | ICD-10-CM | POA: Diagnosis not present

## 2011-09-21 DIAGNOSIS — H608X9 Other otitis externa, unspecified ear: Secondary | ICD-10-CM | POA: Diagnosis not present

## 2011-09-29 DIAGNOSIS — G541 Lumbosacral plexus disorders: Secondary | ICD-10-CM | POA: Diagnosis not present

## 2011-09-29 DIAGNOSIS — M545 Low back pain, unspecified: Secondary | ICD-10-CM | POA: Diagnosis not present

## 2011-09-29 DIAGNOSIS — E0789 Other specified disorders of thyroid: Secondary | ICD-10-CM | POA: Diagnosis not present

## 2011-09-29 DIAGNOSIS — M5430 Sciatica, unspecified side: Secondary | ICD-10-CM | POA: Diagnosis not present

## 2011-09-29 DIAGNOSIS — M542 Cervicalgia: Secondary | ICD-10-CM | POA: Diagnosis not present

## 2011-10-01 ENCOUNTER — Other Ambulatory Visit: Payer: Self-pay | Admitting: Otolaryngology

## 2011-10-01 DIAGNOSIS — R599 Enlarged lymph nodes, unspecified: Secondary | ICD-10-CM | POA: Diagnosis not present

## 2011-10-06 DIAGNOSIS — J309 Allergic rhinitis, unspecified: Secondary | ICD-10-CM | POA: Diagnosis not present

## 2011-10-14 DIAGNOSIS — J309 Allergic rhinitis, unspecified: Secondary | ICD-10-CM | POA: Diagnosis not present

## 2011-10-14 DIAGNOSIS — M25569 Pain in unspecified knee: Secondary | ICD-10-CM | POA: Diagnosis not present

## 2011-10-18 DIAGNOSIS — M25569 Pain in unspecified knee: Secondary | ICD-10-CM | POA: Diagnosis not present

## 2011-10-19 DIAGNOSIS — J309 Allergic rhinitis, unspecified: Secondary | ICD-10-CM | POA: Diagnosis not present

## 2011-10-22 DIAGNOSIS — IMO0001 Reserved for inherently not codable concepts without codable children: Secondary | ICD-10-CM | POA: Diagnosis not present

## 2011-10-26 DIAGNOSIS — M25569 Pain in unspecified knee: Secondary | ICD-10-CM | POA: Diagnosis not present

## 2011-10-26 DIAGNOSIS — J309 Allergic rhinitis, unspecified: Secondary | ICD-10-CM | POA: Diagnosis not present

## 2011-10-28 DIAGNOSIS — M533 Sacrococcygeal disorders, not elsewhere classified: Secondary | ICD-10-CM | POA: Diagnosis not present

## 2011-10-28 DIAGNOSIS — M5137 Other intervertebral disc degeneration, lumbosacral region: Secondary | ICD-10-CM | POA: Diagnosis not present

## 2011-11-01 DIAGNOSIS — J309 Allergic rhinitis, unspecified: Secondary | ICD-10-CM | POA: Diagnosis not present

## 2011-11-02 DIAGNOSIS — M25569 Pain in unspecified knee: Secondary | ICD-10-CM | POA: Diagnosis not present

## 2011-11-03 ENCOUNTER — Other Ambulatory Visit (HOSPITAL_COMMUNITY): Payer: Self-pay | Admitting: *Deleted

## 2011-11-05 ENCOUNTER — Other Ambulatory Visit (HOSPITAL_COMMUNITY): Payer: Self-pay | Admitting: Gastroenterology

## 2011-11-05 DIAGNOSIS — K921 Melena: Secondary | ICD-10-CM | POA: Diagnosis not present

## 2011-11-05 DIAGNOSIS — R112 Nausea with vomiting, unspecified: Secondary | ICD-10-CM | POA: Diagnosis not present

## 2011-11-05 DIAGNOSIS — R1310 Dysphagia, unspecified: Secondary | ICD-10-CM

## 2011-11-05 DIAGNOSIS — R1314 Dysphagia, pharyngoesophageal phase: Secondary | ICD-10-CM | POA: Diagnosis not present

## 2011-11-08 DIAGNOSIS — M542 Cervicalgia: Secondary | ICD-10-CM | POA: Diagnosis not present

## 2011-11-08 DIAGNOSIS — M545 Low back pain, unspecified: Secondary | ICD-10-CM | POA: Diagnosis not present

## 2011-11-08 DIAGNOSIS — G541 Lumbosacral plexus disorders: Secondary | ICD-10-CM | POA: Diagnosis not present

## 2011-11-08 DIAGNOSIS — M5430 Sciatica, unspecified side: Secondary | ICD-10-CM | POA: Diagnosis not present

## 2011-11-08 DIAGNOSIS — Z79899 Other long term (current) drug therapy: Secondary | ICD-10-CM | POA: Diagnosis not present

## 2011-11-09 DIAGNOSIS — J309 Allergic rhinitis, unspecified: Secondary | ICD-10-CM | POA: Diagnosis not present

## 2011-11-09 DIAGNOSIS — E538 Deficiency of other specified B group vitamins: Secondary | ICD-10-CM | POA: Diagnosis not present

## 2011-11-09 DIAGNOSIS — M25569 Pain in unspecified knee: Secondary | ICD-10-CM | POA: Diagnosis not present

## 2011-11-11 ENCOUNTER — Encounter (HOSPITAL_COMMUNITY): Payer: Self-pay

## 2011-11-11 ENCOUNTER — Encounter (HOSPITAL_COMMUNITY)
Admission: RE | Admit: 2011-11-11 | Discharge: 2011-11-11 | Disposition: A | Discharge status: Home / Self Care | Payer: Managed Care, Other (non HMO) | Source: Ambulatory Visit | Attending: Gastroenterology | Admitting: Gastroenterology

## 2011-11-11 DIAGNOSIS — R1310 Dysphagia, unspecified: Secondary | ICD-10-CM | POA: Diagnosis not present

## 2011-11-11 DIAGNOSIS — K3189 Other diseases of stomach and duodenum: Secondary | ICD-10-CM | POA: Diagnosis not present

## 2011-11-11 DIAGNOSIS — R112 Nausea with vomiting, unspecified: Secondary | ICD-10-CM | POA: Insufficient documentation

## 2011-11-11 DIAGNOSIS — R1013 Epigastric pain: Secondary | ICD-10-CM | POA: Diagnosis not present

## 2011-11-11 MED ORDER — TECHNETIUM TC 99M SULFUR COLLOID
2.2000 | Freq: Once | INTRAVENOUS | Status: AC | PRN
  Administered 2011-11-11: 2.2 via ORAL

## 2011-11-16 DIAGNOSIS — J029 Acute pharyngitis, unspecified: Secondary | ICD-10-CM | POA: Diagnosis not present

## 2011-11-16 DIAGNOSIS — M25569 Pain in unspecified knee: Secondary | ICD-10-CM | POA: Diagnosis not present

## 2011-11-16 DIAGNOSIS — J309 Allergic rhinitis, unspecified: Secondary | ICD-10-CM | POA: Diagnosis not present

## 2011-11-17 DIAGNOSIS — J029 Acute pharyngitis, unspecified: Secondary | ICD-10-CM | POA: Diagnosis not present

## 2011-11-17 DIAGNOSIS — J019 Acute sinusitis, unspecified: Secondary | ICD-10-CM | POA: Diagnosis not present

## 2011-11-17 DIAGNOSIS — R509 Fever, unspecified: Secondary | ICD-10-CM | POA: Diagnosis not present

## 2011-11-23 DIAGNOSIS — J309 Allergic rhinitis, unspecified: Secondary | ICD-10-CM | POA: Diagnosis not present

## 2011-11-25 ENCOUNTER — Other Ambulatory Visit (HOSPITAL_COMMUNITY): Payer: Self-pay | Admitting: Orthopedic Surgery

## 2011-11-25 DIAGNOSIS — M25561 Pain in right knee: Secondary | ICD-10-CM

## 2011-11-30 DIAGNOSIS — J309 Allergic rhinitis, unspecified: Secondary | ICD-10-CM | POA: Diagnosis not present

## 2011-11-30 DIAGNOSIS — R1013 Epigastric pain: Secondary | ICD-10-CM | POA: Diagnosis not present

## 2011-11-30 DIAGNOSIS — R112 Nausea with vomiting, unspecified: Secondary | ICD-10-CM | POA: Diagnosis not present

## 2011-11-30 DIAGNOSIS — K921 Melena: Secondary | ICD-10-CM | POA: Diagnosis not present

## 2011-12-02 DIAGNOSIS — N9489 Other specified conditions associated with female genital organs and menstrual cycle: Secondary | ICD-10-CM | POA: Diagnosis not present

## 2011-12-06 ENCOUNTER — Encounter (HOSPITAL_COMMUNITY)
Admission: RE | Admit: 2011-12-06 | Discharge: 2011-12-06 | Disposition: A | Discharge status: Home / Self Care | Payer: Managed Care, Other (non HMO) | Source: Ambulatory Visit | Attending: Orthopedic Surgery | Admitting: Orthopedic Surgery

## 2011-12-06 ENCOUNTER — Ambulatory Visit (HOSPITAL_COMMUNITY)
Admission: RE | Admit: 2011-12-06 | Discharge: 2011-12-06 | Disposition: A | Discharge status: Home / Self Care | Payer: Managed Care, Other (non HMO) | Source: Ambulatory Visit | Attending: Orthopedic Surgery | Admitting: Orthopedic Surgery

## 2011-12-06 ENCOUNTER — Encounter (HOSPITAL_COMMUNITY): Payer: Self-pay

## 2011-12-06 ENCOUNTER — Other Ambulatory Visit (HOSPITAL_COMMUNITY): Payer: Managed Care, Other (non HMO)

## 2011-12-06 ENCOUNTER — Ambulatory Visit (HOSPITAL_COMMUNITY)
Admission: RE | Admit: 2011-12-06 | Discharge: 2011-12-06 | Disposition: A | Discharge status: Home / Self Care | Payer: Managed Care, Other (non HMO) | Source: Ambulatory Visit | Attending: Internal Medicine | Admitting: Internal Medicine

## 2011-12-06 ENCOUNTER — Encounter (HOSPITAL_COMMUNITY): Payer: Managed Care, Other (non HMO)

## 2011-12-06 DIAGNOSIS — M79609 Pain in unspecified limb: Secondary | ICD-10-CM | POA: Insufficient documentation

## 2011-12-06 DIAGNOSIS — R937 Abnormal findings on diagnostic imaging of other parts of musculoskeletal system: Secondary | ICD-10-CM | POA: Insufficient documentation

## 2011-12-06 DIAGNOSIS — M810 Age-related osteoporosis without current pathological fracture: Secondary | ICD-10-CM | POA: Insufficient documentation

## 2011-12-06 DIAGNOSIS — Z96659 Presence of unspecified artificial knee joint: Secondary | ICD-10-CM | POA: Insufficient documentation

## 2011-12-06 DIAGNOSIS — M25561 Pain in right knee: Secondary | ICD-10-CM

## 2011-12-06 MED ORDER — SODIUM CHLORIDE 0.9 % IV SOLN
INTRAVENOUS | Status: DC
  Administered 2011-12-06: 10:00:00 via INTRAVENOUS

## 2011-12-06 MED ORDER — ZOLEDRONIC ACID 5 MG/100ML IV SOLN
5.0000 mg | Freq: Once | INTRAVENOUS | Status: AC
  Administered 2011-12-06: 5 mg via INTRAVENOUS
  Filled 2011-12-06: qty 100

## 2011-12-06 MED ORDER — TECHNETIUM TC 99M MEDRONATE IV KIT
25.0000 | PACK | Freq: Once | INTRAVENOUS | Status: AC | PRN
  Administered 2011-12-06: 25 via INTRAVENOUS

## 2011-12-07 ENCOUNTER — Other Ambulatory Visit (HOSPITAL_COMMUNITY): Payer: Managed Care, Other (non HMO)

## 2011-12-07 ENCOUNTER — Encounter (HOSPITAL_COMMUNITY): Payer: Managed Care, Other (non HMO)

## 2011-12-07 DIAGNOSIS — J309 Allergic rhinitis, unspecified: Secondary | ICD-10-CM | POA: Diagnosis not present

## 2011-12-10 DIAGNOSIS — J309 Allergic rhinitis, unspecified: Secondary | ICD-10-CM | POA: Diagnosis not present

## 2011-12-14 DIAGNOSIS — E538 Deficiency of other specified B group vitamins: Secondary | ICD-10-CM | POA: Diagnosis not present

## 2011-12-15 ENCOUNTER — Ambulatory Visit (INDEPENDENT_AMBULATORY_CARE_PROVIDER_SITE_OTHER): Payer: Managed Care, Other (non HMO) | Admitting: General Surgery

## 2011-12-15 ENCOUNTER — Encounter (INDEPENDENT_AMBULATORY_CARE_PROVIDER_SITE_OTHER): Payer: Self-pay | Admitting: General Surgery

## 2011-12-15 VITALS — BP 130/70 | HR 80 | Temp 97.1°F | Resp 16 | Ht 63.0 in | Wt 133.8 lb

## 2011-12-15 DIAGNOSIS — R1013 Epigastric pain: Secondary | ICD-10-CM | POA: Insufficient documentation

## 2011-12-15 DIAGNOSIS — M25569 Pain in unspecified knee: Secondary | ICD-10-CM | POA: Diagnosis not present

## 2011-12-15 NOTE — Progress Notes (Signed)
Patient ID: Kari Green, female   DOB: Jun 20, 1965, 46 y.o.   MRN: [id number]  Chief Complaint  Patient presents with  . Pain    eval abd pain    HPI Kari Green is a 46 y.o. female.   HPI  She is referred by Dr. Jackelyn Knife for evaluation of post prandial epigastric pain with nausea and vomiting. About 12 years ago she states she will underwent laparoscopy for some similar problems and her nausea and vomiting and pain improved. She states they found scar tissue around her intestines. She saw Dr. Jackelyn Knife and he is contemplating a mother laparoscopy and is requested general surgery availability during that time as well. Dr. Madilyn Fireman is her gastroenterologist. She does have delayed gastric emptying and a recent gastric emptying scan. She cannot take Reglan. She states she had a upper endoscopy and colonoscopy which demonstrated some esophageal irritation. She is very frustrated with all the nausea and vomiting.    Past Medical History  Diagnosis Date  . Anxiety   . Headache     followed by Dr. Modesto Charon.  has nerves burned in head and hips  . IC (interstitial cystitis)   . Cancer     melanoma  . Rectal bleeding   . Rectal pain   . Hyperlipidemia   . Osteoporosis   . Thyroid disease   . Abdominal pain   . Constipation   . Lymph node(s) enlarged   . Arthritis pain   . Irritable bowel syndrome (IBS) 02/26/2011  . PONV (postoperative nausea and vomiting)   . Blood transfusion   . Fibromyalgia     states followed by head/pain management  . Arthritis   . Chronic kidney disease     hx kidney stones  . Hemangioma, renal   . Hiatal hernia        ?reflux and ? hiatal hernia/ states has hemangioma on liver x 2-  . Sleep apnea     mild- no cpap    Past Surgical History  Procedure Date  . Achilles tendon repair 11/12  . Abdominal hysterectomy   . Cholecystectomy   . Tonsillectomy   . Cystoscopy w/ dilation of bladder   . Cystoscopy with hydrodistension and biopsy   . Sp arthro  elbow*l*     STATES CANNOT USE LEFT UPPER ARM FOR BLOOD PRESSURES  . Rt thumb   . Diagnostic laparoscopy   . Total thyroidectomy   . Appendectomy   . Left carpal tunnel      BILATERAL  . Cysto with hydrodistension 01/29/2011    Procedure: CYSTOSCOPY/HYDRODISTENSION;  Surgeon: Garnett Farm, MD;  Location: [facility];  Service: Urology;  Laterality: N/A;  . Cystoscopy w/ retrogrades 01/29/2011    Procedure: CYSTOSCOPY WITH RETROGRADE PYELOGRAM;  Surgeon: Garnett Farm, MD;  Location: [facility];  Service: Urology;  Laterality: Bilateral;  . Breast surgery     augmentation x2  . Joint replacement '05     rt knee surgeries x 9  . Fissurectomy     x 2  . Lasic     bilateral  . Nasal sinus surgery     Family History  Problem Relation Age of Onset  . Anesthesia problems Mother   . Cancer Mother     ovarian  . Cancer Daughter     cervical  . Cancer Maternal Aunt     breast    Social History History  Substance Use Topics  . Smoking status: Former  Smoker    Quit date: 01/25/2008  . Smokeless tobacco: Never Used  . Alcohol Use: No    Allergies  Allergen Reactions  . Latex Swelling  . Morphine And Related Itching    States all pain meds cause itching/ STATES HAS TO PREMED WITH BENADRYL  . Prednisone     Yellow 5 Red 4  . Red Dye Hives  . Tape Other (See Comments)    Blisters skin-USE PAPER TAPE  And OP SITE OK  . Yellow Dyes (Non-Tartrazine) Swelling    Current Outpatient Prescriptions  Medication Sig Dispense Refill  . ALPRAZolam (XANAX) 0.5 MG tablet Take 0.5 mg by mouth 3 (three) times daily as needed. Anxiety       . calcium carbonate (OS-CAL) 1250 MG chewable tablet Chew 1 tablet by mouth 2 (two) times daily.      . cholecalciferol (VITAMIN D) 1000 UNITS tablet Take 750 Units by mouth 2 (two) times daily.      . diphenhydrAMINE (BENADRYL) 50 MG tablet Take 50 mg by mouth every 8 (eight) hours as needed. Allergies       .  estradiol (ESTRACE) 0.5 MG tablet Take 1 mg by mouth at bedtime.       Marland Kitchen HYDROmorphone (DILAUDID) 2 MG tablet Take 2 mg by mouth every 6 (six) hours as needed. Pain       . hydrOXYzine (VISTARIL) 25 MG capsule Take 50 mg by mouth 3 (three) times daily as needed. Allergies      States alternates with Benadryl       . levothyroxine (SYNTHROID, LEVOTHROID) 150 MCG tablet Take 150 mcg by mouth at bedtime.      . Multiple Vitamin (MULITIVITAMIN WITH MINERALS) TABS Take 1 tablet by mouth daily.        Review of Systems Review of Systems  Constitutional: Negative for fever and chills.  Respiratory: Negative.   Cardiovascular: Negative.   Gastrointestinal: Positive for nausea, vomiting, abdominal pain and abdominal distention. Negative for blood in stool and rectal pain.  Hematological: Negative.     Blood pressure 130/70, pulse 80, temperature 97.1 F (36.2 C), temperature source Temporal, resp. rate 16, height 5\' 3"  (1.6 m), weight 133 lb 12.8 oz (60.691 kg).  Physical Exam Physical Exam  Constitutional: She appears well-developed and well-nourished. No distress.  HENT:  Head: Normocephalic and atraumatic.  Cardiovascular: Normal rate and regular rhythm.   Pulmonary/Chest: Effort normal and breath sounds normal.  Abdominal: Soft. She exhibits no distension and no mass. There is no tenderness.       Multiple small scars are present. There are no hernias present.  Musculoskeletal: She exhibits no edema.  Lymphadenopathy:    She has no cervical adenopathy.  Skin: Skin is warm and dry.    Data Reviewed Gastric emptying scan.  Dr. Eligha Bridegroom note.  Assessment    Postprandial epigastric bloating and pain with nausea and vomiting. She does have documented delayed gastric emptying. There is some concern were that this may be a adhesive process involving the small bowel as well. Dr. Jackelyn Knife is contemplating a laparoscopy for this. I told her that the gastric emptying problem may indeed  be the sole cause of this. I told her I would be happy to be available to assist Dr. Jackelyn Knife with a laparoscopy if she chose to do so.    Plan    I will speak with Dr. Jackelyn Knife and if we decide to proceed with the laparoscopy I will be happy to  assist him. The potential procedure and risks from my standpoint were discussed with her. If the laparoscopy is not revealing that she may need to see a motility specialist. I did tell her that the Dilaudid would contribute to her delayed gastric emptying and she states she had the problem prior to taking the Dilaudid.       Kenedee Molesky J 12/15/2011, 11:22 AM

## 2011-12-15 NOTE — Patient Instructions (Signed)
I will talk with Dr. Jackelyn Knife about coordinating your surgery.

## 2011-12-21 DIAGNOSIS — M5430 Sciatica, unspecified side: Secondary | ICD-10-CM | POA: Diagnosis not present

## 2011-12-21 DIAGNOSIS — H81399 Other peripheral vertigo, unspecified ear: Secondary | ICD-10-CM | POA: Diagnosis not present

## 2011-12-21 DIAGNOSIS — Z79899 Other long term (current) drug therapy: Secondary | ICD-10-CM | POA: Diagnosis not present

## 2011-12-21 DIAGNOSIS — M545 Low back pain, unspecified: Secondary | ICD-10-CM | POA: Diagnosis not present

## 2011-12-21 DIAGNOSIS — H8190 Unspecified disorder of vestibular function, unspecified ear: Secondary | ICD-10-CM | POA: Diagnosis not present

## 2011-12-21 DIAGNOSIS — G541 Lumbosacral plexus disorders: Secondary | ICD-10-CM | POA: Diagnosis not present

## 2011-12-21 DIAGNOSIS — J309 Allergic rhinitis, unspecified: Secondary | ICD-10-CM | POA: Diagnosis not present

## 2011-12-21 DIAGNOSIS — M542 Cervicalgia: Secondary | ICD-10-CM | POA: Diagnosis not present

## 2011-12-27 ENCOUNTER — Other Ambulatory Visit: Payer: Self-pay | Admitting: Dermatology

## 2011-12-27 DIAGNOSIS — D485 Neoplasm of uncertain behavior of skin: Secondary | ICD-10-CM | POA: Diagnosis not present

## 2011-12-27 DIAGNOSIS — L821 Other seborrheic keratosis: Secondary | ICD-10-CM | POA: Diagnosis not present

## 2011-12-27 DIAGNOSIS — L820 Inflamed seborrheic keratosis: Secondary | ICD-10-CM | POA: Diagnosis not present

## 2011-12-27 DIAGNOSIS — D235 Other benign neoplasm of skin of trunk: Secondary | ICD-10-CM | POA: Diagnosis not present

## 2011-12-27 DIAGNOSIS — J309 Allergic rhinitis, unspecified: Secondary | ICD-10-CM | POA: Diagnosis not present

## 2011-12-27 DIAGNOSIS — I789 Disease of capillaries, unspecified: Secondary | ICD-10-CM | POA: Diagnosis not present

## 2011-12-27 DIAGNOSIS — L570 Actinic keratosis: Secondary | ICD-10-CM | POA: Diagnosis not present

## 2011-12-28 ENCOUNTER — Other Ambulatory Visit: Payer: Self-pay | Admitting: Internal Medicine

## 2011-12-28 DIAGNOSIS — N63 Unspecified lump in unspecified breast: Secondary | ICD-10-CM | POA: Diagnosis not present

## 2011-12-31 ENCOUNTER — Ambulatory Visit
Admission: RE | Admit: 2011-12-31 | Discharge: 2011-12-31 | Disposition: A | Discharge status: Home / Self Care | Payer: Managed Care, Other (non HMO) | Source: Ambulatory Visit | Attending: Internal Medicine | Admitting: Internal Medicine

## 2011-12-31 DIAGNOSIS — N63 Unspecified lump in unspecified breast: Secondary | ICD-10-CM

## 2012-01-03 DIAGNOSIS — J309 Allergic rhinitis, unspecified: Secondary | ICD-10-CM | POA: Diagnosis not present

## 2012-01-05 ENCOUNTER — Other Ambulatory Visit: Payer: Managed Care, Other (non HMO)

## 2012-01-05 DIAGNOSIS — M25569 Pain in unspecified knee: Secondary | ICD-10-CM | POA: Diagnosis not present

## 2012-01-11 DIAGNOSIS — J309 Allergic rhinitis, unspecified: Secondary | ICD-10-CM | POA: Diagnosis not present

## 2012-01-11 DIAGNOSIS — M542 Cervicalgia: Secondary | ICD-10-CM | POA: Diagnosis not present

## 2012-01-11 DIAGNOSIS — H81399 Other peripheral vertigo, unspecified ear: Secondary | ICD-10-CM | POA: Diagnosis not present

## 2012-01-11 DIAGNOSIS — G541 Lumbosacral plexus disorders: Secondary | ICD-10-CM | POA: Diagnosis not present

## 2012-01-11 DIAGNOSIS — H8190 Unspecified disorder of vestibular function, unspecified ear: Secondary | ICD-10-CM | POA: Diagnosis not present

## 2012-01-11 DIAGNOSIS — Z79899 Other long term (current) drug therapy: Secondary | ICD-10-CM | POA: Diagnosis not present

## 2012-01-14 DIAGNOSIS — E538 Deficiency of other specified B group vitamins: Secondary | ICD-10-CM | POA: Diagnosis not present

## 2012-01-17 DIAGNOSIS — E039 Hypothyroidism, unspecified: Secondary | ICD-10-CM | POA: Diagnosis not present

## 2012-01-17 DIAGNOSIS — E785 Hyperlipidemia, unspecified: Secondary | ICD-10-CM | POA: Diagnosis not present

## 2012-01-17 DIAGNOSIS — F411 Generalized anxiety disorder: Secondary | ICD-10-CM | POA: Diagnosis not present

## 2012-01-17 DIAGNOSIS — M810 Age-related osteoporosis without current pathological fracture: Secondary | ICD-10-CM | POA: Diagnosis not present

## 2012-01-18 DIAGNOSIS — J309 Allergic rhinitis, unspecified: Secondary | ICD-10-CM | POA: Diagnosis not present

## 2012-01-24 DIAGNOSIS — J309 Allergic rhinitis, unspecified: Secondary | ICD-10-CM | POA: Diagnosis not present

## 2012-02-01 ENCOUNTER — Encounter (INDEPENDENT_AMBULATORY_CARE_PROVIDER_SITE_OTHER): Payer: Managed Care, Other (non HMO) | Admitting: General Surgery

## 2012-02-03 ENCOUNTER — Encounter (HOSPITAL_COMMUNITY): Payer: Self-pay | Admitting: Pharmacy Technician

## 2012-02-03 NOTE — Patient Instructions (Signed)
20 ARCHANA ECKMAN  02/03/2012   Your procedure is scheduled on: 02/11/12   Report to [facility] at 0730 AM.  Call this number if you have problems the morning of surgery: [phone]   Remember:   Do not eat food:After Midnight.  May have clear liquids:until Midnight .    Take these medicines the morning of surgery with A SIP OF WATER:   Do not wear jewelry, make-up or nail polish.  Do not wear lotions, powders, or perfumes.   Do not shave 48 hours prior to surgery.   Do not bring valuables to the hospital.  Contacts, dentures or bridgework may not be worn into surgery.       Patients discharged the day of surgery will not be allowed to drive home.  Name and phone number of your driver:              SEE CHG INSTRUCTION SHEET    Please read over the following fact sheets that you were given: MRSA Information, coughing and deep breathing exercises, leg exercises               Failure to comply with these instructions may result in cancellation of your surgery .             Patient Signature___________________________________________________________             Nurse Signature ___________________________________________________________

## 2012-02-04 ENCOUNTER — Encounter (HOSPITAL_COMMUNITY)
Admission: RE | Admit: 2012-02-04 | Discharge: 2012-02-04 | Disposition: A | Discharge status: Home / Self Care | Payer: Managed Care, Other (non HMO) | Source: Ambulatory Visit | Attending: Obstetrics and Gynecology | Admitting: Obstetrics and Gynecology

## 2012-02-04 ENCOUNTER — Encounter (HOSPITAL_COMMUNITY): Payer: Self-pay

## 2012-02-04 DIAGNOSIS — K3189 Other diseases of stomach and duodenum: Secondary | ICD-10-CM | POA: Diagnosis not present

## 2012-02-04 DIAGNOSIS — G4730 Sleep apnea, unspecified: Secondary | ICD-10-CM | POA: Diagnosis not present

## 2012-02-04 DIAGNOSIS — M810 Age-related osteoporosis without current pathological fracture: Secondary | ICD-10-CM | POA: Diagnosis not present

## 2012-02-04 DIAGNOSIS — E039 Hypothyroidism, unspecified: Secondary | ICD-10-CM | POA: Diagnosis not present

## 2012-02-04 DIAGNOSIS — Z9079 Acquired absence of other genital organ(s): Secondary | ICD-10-CM | POA: Diagnosis not present

## 2012-02-04 DIAGNOSIS — R1013 Epigastric pain: Secondary | ICD-10-CM | POA: Diagnosis not present

## 2012-02-04 DIAGNOSIS — Z79899 Other long term (current) drug therapy: Secondary | ICD-10-CM | POA: Diagnosis not present

## 2012-02-04 DIAGNOSIS — K449 Diaphragmatic hernia without obstruction or gangrene: Secondary | ICD-10-CM | POA: Diagnosis not present

## 2012-02-04 DIAGNOSIS — Z90710 Acquired absence of both cervix and uterus: Secondary | ICD-10-CM | POA: Diagnosis not present

## 2012-02-04 DIAGNOSIS — N949 Unspecified condition associated with female genital organs and menstrual cycle: Secondary | ICD-10-CM | POA: Diagnosis not present

## 2012-02-04 DIAGNOSIS — R112 Nausea with vomiting, unspecified: Secondary | ICD-10-CM | POA: Diagnosis not present

## 2012-02-04 DIAGNOSIS — E785 Hyperlipidemia, unspecified: Secondary | ICD-10-CM | POA: Diagnosis not present

## 2012-02-04 HISTORY — DX: Pneumonia, unspecified organism: J18.9

## 2012-02-04 LAB — CBC
MCH: 29.8 pg (ref 26.0–34.0)
MCV: 89.4 fL (ref 78.0–100.0)
Platelets: 353 10*3/uL (ref 150–400)
RDW: 13.1 % (ref 11.5–15.5)
WBC: 8.7 10*3/uL (ref 4.0–10.5)

## 2012-02-07 NOTE — Progress Notes (Signed)
Need orders in EPIC if needed for surgery on 02/11/12.  Preop done on 02/04/12.  CBC was done No other labs needed per anesthesia guidelines.

## 2012-02-08 ENCOUNTER — Encounter (INDEPENDENT_AMBULATORY_CARE_PROVIDER_SITE_OTHER): Payer: Self-pay | Admitting: General Surgery

## 2012-02-08 ENCOUNTER — Ambulatory Visit (INDEPENDENT_AMBULATORY_CARE_PROVIDER_SITE_OTHER): Payer: Managed Care, Other (non HMO) | Admitting: General Surgery

## 2012-02-08 VITALS — BP 140/76 | HR 76 | Temp 97.0°F | Resp 16 | Ht 62.5 in | Wt 136.4 lb

## 2012-02-08 DIAGNOSIS — J309 Allergic rhinitis, unspecified: Secondary | ICD-10-CM | POA: Diagnosis not present

## 2012-02-08 DIAGNOSIS — R109 Unspecified abdominal pain: Secondary | ICD-10-CM | POA: Diagnosis not present

## 2012-02-08 NOTE — Progress Notes (Signed)
Patient ID: Kari Green, female   DOB: 08-13-1965, 46 y.o.   MRN: [id number] She comes in today concerned about a hiatal hernia. She was told in the past that she may have a small hiatal hernia. I talk with Dr. Dorena Cookey and there is no evidence on 3 previous studies that she has a hiatal hernia. I discussed this with her. I told her Dr. Jackelyn Knife and I could look intraoperatively for that, but I did not think she had a substantial enough hernia defect to even consider repair (if one is even present).

## 2012-02-08 NOTE — Patient Instructions (Signed)
You not have a significant hiatal hernia as far as we can tell.  However, we will look during your surgery.

## 2012-02-11 ENCOUNTER — Ambulatory Visit (HOSPITAL_COMMUNITY): Payer: Managed Care, Other (non HMO) | Admitting: Anesthesiology

## 2012-02-11 ENCOUNTER — Encounter (HOSPITAL_COMMUNITY): Payer: Self-pay | Admitting: *Deleted

## 2012-02-11 ENCOUNTER — Encounter (HOSPITAL_COMMUNITY): Payer: Self-pay | Admitting: Anesthesiology

## 2012-02-11 ENCOUNTER — Ambulatory Visit (HOSPITAL_COMMUNITY)
Admission: RE | Admit: 2012-02-11 | Discharge: 2012-02-11 | Disposition: A | Discharge status: Home / Self Care | Payer: Managed Care, Other (non HMO) | Source: Ambulatory Visit | Attending: Obstetrics and Gynecology | Admitting: Obstetrics and Gynecology

## 2012-02-11 ENCOUNTER — Encounter (HOSPITAL_COMMUNITY)
Admission: RE | Disposition: A | Discharge status: Home / Self Care | Payer: Self-pay | Source: Ambulatory Visit | Attending: Obstetrics and Gynecology

## 2012-02-11 DIAGNOSIS — R1013 Epigastric pain: Secondary | ICD-10-CM | POA: Insufficient documentation

## 2012-02-11 DIAGNOSIS — K660 Peritoneal adhesions (postprocedural) (postinfection): Secondary | ICD-10-CM | POA: Diagnosis not present

## 2012-02-11 DIAGNOSIS — K3189 Other diseases of stomach and duodenum: Secondary | ICD-10-CM | POA: Diagnosis not present

## 2012-02-11 DIAGNOSIS — R109 Unspecified abdominal pain: Secondary | ICD-10-CM

## 2012-02-11 DIAGNOSIS — R1900 Intra-abdominal and pelvic swelling, mass and lump, unspecified site: Secondary | ICD-10-CM | POA: Diagnosis not present

## 2012-02-11 DIAGNOSIS — N949 Unspecified condition associated with female genital organs and menstrual cycle: Secondary | ICD-10-CM | POA: Diagnosis not present

## 2012-02-11 DIAGNOSIS — G4730 Sleep apnea, unspecified: Secondary | ICD-10-CM | POA: Insufficient documentation

## 2012-02-11 DIAGNOSIS — Z90710 Acquired absence of both cervix and uterus: Secondary | ICD-10-CM | POA: Insufficient documentation

## 2012-02-11 DIAGNOSIS — R112 Nausea with vomiting, unspecified: Secondary | ICD-10-CM | POA: Insufficient documentation

## 2012-02-11 DIAGNOSIS — M810 Age-related osteoporosis without current pathological fracture: Secondary | ICD-10-CM | POA: Insufficient documentation

## 2012-02-11 DIAGNOSIS — Z79899 Other long term (current) drug therapy: Secondary | ICD-10-CM | POA: Insufficient documentation

## 2012-02-11 DIAGNOSIS — K449 Diaphragmatic hernia without obstruction or gangrene: Secondary | ICD-10-CM | POA: Insufficient documentation

## 2012-02-11 DIAGNOSIS — E039 Hypothyroidism, unspecified: Secondary | ICD-10-CM | POA: Insufficient documentation

## 2012-02-11 DIAGNOSIS — E785 Hyperlipidemia, unspecified: Secondary | ICD-10-CM | POA: Diagnosis not present

## 2012-02-11 DIAGNOSIS — Z9079 Acquired absence of other genital organ(s): Secondary | ICD-10-CM | POA: Insufficient documentation

## 2012-02-11 HISTORY — PX: LAPAROSCOPY: SHX197

## 2012-02-11 HISTORY — PX: LAPAROSCOPIC LYSIS OF ADHESIONS: SHX5905

## 2012-02-11 SURGERY — LAPAROSCOPY, DIAGNOSTIC
Anesthesia: General | Site: Abdomen | Wound class: Clean

## 2012-02-11 MED ORDER — CEFAZOLIN SODIUM-DEXTROSE 2-3 GM-% IV SOLR
2.0000 g | INTRAVENOUS | Status: AC
  Administered 2012-02-11: 2 g via INTRAVENOUS

## 2012-02-11 MED ORDER — DIPHENHYDRAMINE HCL 25 MG PO TABS
50.0000 mg | ORAL_TABLET | Freq: Once | ORAL | Status: AC
  Administered 2012-02-11: 50 mg via ORAL
  Filled 2012-02-11 (×2): qty 2

## 2012-02-11 MED ORDER — DROPERIDOL 2.5 MG/ML IJ SOLN
INTRAMUSCULAR | Status: DC | PRN
  Administered 2012-02-11: 0.625 mg via INTRAVENOUS

## 2012-02-11 MED ORDER — BUPIVACAINE HCL 0.25 % IJ SOLN
INTRAMUSCULAR | Status: DC | PRN
  Administered 2012-02-11: 20 mL

## 2012-02-11 MED ORDER — FENTANYL CITRATE 0.05 MG/ML IJ SOLN
INTRAMUSCULAR | Status: DC | PRN
  Administered 2012-02-11: 100 ug via INTRAVENOUS
  Administered 2012-02-11: 50 ug via INTRAVENOUS
  Administered 2012-02-11: 100 ug via INTRAVENOUS

## 2012-02-11 MED ORDER — NEOSTIGMINE METHYLSULFATE 1 MG/ML IJ SOLN
INTRAMUSCULAR | Status: DC | PRN
  Administered 2012-02-11: 5 mg via INTRAVENOUS

## 2012-02-11 MED ORDER — LACTATED RINGERS IV SOLN
INTRAVENOUS | Status: DC
  Administered 2012-02-11 (×2): 1000 mL via INTRAVENOUS

## 2012-02-11 MED ORDER — HYDROMORPHONE HCL PF 1 MG/ML IJ SOLN
INTRAMUSCULAR | Status: DC | PRN
  Administered 2012-02-11 (×2): 0.5 mg via INTRAVENOUS

## 2012-02-11 MED ORDER — KETOROLAC TROMETHAMINE 30 MG/ML IJ SOLN
15.0000 mg | Freq: Once | INTRAMUSCULAR | Status: AC | PRN
  Administered 2012-02-11: 30 mg via INTRAVENOUS
  Filled 2012-02-11: qty 1

## 2012-02-11 MED ORDER — ACETAMINOPHEN 10 MG/ML IV SOLN
INTRAVENOUS | Status: DC | PRN
  Administered 2012-02-11: 1000 mg via INTRAVENOUS

## 2012-02-11 MED ORDER — ACETAMINOPHEN 10 MG/ML IV SOLN
INTRAVENOUS | Status: AC
  Filled 2012-02-11: qty 100

## 2012-02-11 MED ORDER — GLYCOPYRROLATE 0.2 MG/ML IJ SOLN
INTRAMUSCULAR | Status: DC | PRN
  Administered 2012-02-11: 0.6 mg via INTRAVENOUS

## 2012-02-11 MED ORDER — HYDROMORPHONE HCL PF 1 MG/ML IJ SOLN: 0.2500 mg | INTRAMUSCULAR | Status: DC | PRN

## 2012-02-11 MED ORDER — ROCURONIUM BROMIDE 100 MG/10ML IV SOLN
INTRAVENOUS | Status: DC | PRN
  Administered 2012-02-11: 40 mg via INTRAVENOUS

## 2012-02-11 MED ORDER — HYDROMORPHONE HCL 2 MG PO TABS
2.0000 mg | ORAL_TABLET | Freq: Once | ORAL | Status: AC
  Administered 2012-02-11: 2 mg via ORAL
  Filled 2012-02-11: qty 1

## 2012-02-11 MED ORDER — DIPHENHYDRAMINE HCL 50 MG/ML IJ SOLN
INTRAMUSCULAR | Status: DC | PRN
  Administered 2012-02-11: 25 mg via INTRAVENOUS

## 2012-02-11 MED ORDER — LIDOCAINE HCL (CARDIAC) 20 MG/ML IV SOLN
INTRAVENOUS | Status: DC | PRN
  Administered 2012-02-11: 50 mg via INTRAVENOUS

## 2012-02-11 MED ORDER — ONDANSETRON HCL 4 MG/2ML IJ SOLN
INTRAMUSCULAR | Status: DC | PRN
  Administered 2012-02-11: 4 mg via INTRAVENOUS

## 2012-02-11 MED ORDER — PROMETHAZINE HCL 25 MG/ML IJ SOLN: 6.2500 mg | INTRAMUSCULAR | Status: DC | PRN

## 2012-02-11 MED ORDER — MIDAZOLAM HCL 5 MG/5ML IJ SOLN
INTRAMUSCULAR | Status: DC | PRN
  Administered 2012-02-11: 2 mg via INTRAVENOUS

## 2012-02-11 MED ORDER — CEFAZOLIN SODIUM-DEXTROSE 2-3 GM-% IV SOLR
INTRAVENOUS | Status: AC
  Filled 2012-02-11: qty 50

## 2012-02-11 MED ORDER — PROPOFOL 10 MG/ML IV BOLUS
INTRAVENOUS | Status: DC | PRN
  Administered 2012-02-11: 200 mg via INTRAVENOUS

## 2012-02-11 MED ORDER — BUPIVACAINE-EPINEPHRINE 0.25% -1:200000 IJ SOLN
INTRAMUSCULAR | Status: AC
  Filled 2012-02-11: qty 1

## 2012-02-11 SURGICAL SUPPLY — 33 items
CABLE HIGH FREQUENCY MONO STRZ (ELECTRODE) ×3 IMPLANT
CATH FOLEY 2WAY  3CC 10FR (CATHETERS)
CATH FOLEY 2WAY 3CC 10FR (CATHETERS) IMPLANT
CATH ROBINSON RED A/P 16FR (CATHETERS) IMPLANT
CHLORAPREP W/TINT 26ML (MISCELLANEOUS) ×3 IMPLANT
CLOTH BEACON ORANGE TIMEOUT ST (SAFETY) ×3 IMPLANT
DECANTER SPIKE VIAL GLASS SM (MISCELLANEOUS) ×3 IMPLANT
DERMABOND ADVANCED (GAUZE/BANDAGES/DRESSINGS) ×1
DERMABOND ADVANCED .7 DNX12 (GAUZE/BANDAGES/DRESSINGS) ×2 IMPLANT
GLOVE BIO SURGEON STRL SZ8 (GLOVE) IMPLANT
GLOVE ORTHO TXT STRL SZ7.5 (GLOVE) ×3 IMPLANT
GLOVE SURG SS PI 7.5 STRL IVOR (GLOVE) ×3 IMPLANT
GLOVE SURG SS PI 8.0 STRL IVOR (GLOVE) ×3 IMPLANT
GOWN PREVENTION PLUS LG XLONG (DISPOSABLE) ×12 IMPLANT
KIT BASIN OR (CUSTOM PROCEDURE TRAY) ×3 IMPLANT
NEEDLE INSUFFLATION 14GA 120MM (NEEDLE) ×3 IMPLANT
NS IRRIG 1000ML POUR BTL (IV SOLUTION) ×3 IMPLANT
PACK LAPAROSCOPY W LONG (CUSTOM PROCEDURE TRAY) IMPLANT
PROTECTOR NERVE ULNAR (MISCELLANEOUS) ×3 IMPLANT
SCALPEL HARMONIC ACE (MISCELLANEOUS) IMPLANT
SCISSORS LAP 5X35 DISP (ENDOMECHANICALS) ×3 IMPLANT
SET IRRIG TUBING LAPAROSCOPIC (IRRIGATION / IRRIGATOR) ×3 IMPLANT
SLEEVE Z-THREAD 5X100MM (TROCAR) IMPLANT
SOLUTION ELECTROLUBE (MISCELLANEOUS) IMPLANT
SUT VIC AB 4-0 P2 18 (SUTURE) ×3 IMPLANT
SUT VICRYL 0 UR6 27IN ABS (SUTURE) IMPLANT
TOWEL OR 17X26 10 PK STRL BLUE (TOWEL DISPOSABLE) ×6 IMPLANT
TRAY FOLEY METER SIL LF 16FR (CATHETERS) ×3 IMPLANT
TRAY LAP CHOLE (CUSTOM PROCEDURE TRAY) ×3 IMPLANT
TROCAR Z-THREAD FIOS 11X100 BL (TROCAR) ×3 IMPLANT
TROCAR Z-THREAD FIOS 5X100MM (TROCAR) ×6 IMPLANT
WARMER LAPAROSCOPE (MISCELLANEOUS) ×3 IMPLANT
WATER STERILE IRR 1000ML POUR (IV SOLUTION) ×3 IMPLANT

## 2012-02-11 NOTE — Anesthesia Postprocedure Evaluation (Signed)
  Anesthesia Post-op Note  Patient: Kari Green  Procedure(s) Performed: Procedure(s) (LRB): LAPAROSCOPY DIAGNOSTIC (N/A) LAPAROSCOPIC LYSIS OF ADHESIONS ()  Patient Location: PACU  Anesthesia Type: General  Level of Consciousness: awake and alert   Airway and Oxygen Therapy: Patient Spontanous Breathing  Post-op Pain: mild  Post-op Assessment: Post-op Vital signs reviewed, Patient's Cardiovascular Status Stable, Respiratory Function Stable, Patent Airway and No signs of Nausea or vomiting  Last Vitals:  Filed Vitals:   02/11/12 1215  BP: 111/67  Pulse: 52  Temp:   Resp: 10    Post-op Vital Signs: stable   Complications: No apparent anesthesia complications

## 2012-02-11 NOTE — Progress Notes (Signed)
Refuses to remove all rings/ 2 to daughter/ one on pt's lt ring finger

## 2012-02-11 NOTE — H&P (Signed)
Kari Green is an 46 y.o. female. She had laparoscopy with BSO in 1998 for pain and endometriosis, followed by LAVH in 1999 for persistent pain.  She then had a laparoscopy for pain in 2002 with adhesiolysis.  She had been doing well, had annual exam with Dr. Ambrose Mantle this past May without problems.  Since May she has had increasing abdominal/pelvic pain and n/v.  She has been evaluated by GI, has some delayed gastric emptying and a small hiatal hernia.  She feels this pain is the same as before she had her previous laparoscopy.  Medical and surgical options have been discussed, she is fairly insistent that she have a laparoscopy to evaluate for adhesions again.  Pertinent Gynecological History: Last mammogram: normal Date: May, 2013 OB History: G1, P1001 SVD at 38 weeks, no problems     Past Medical History  Diagnosis Date  . Anxiety   . Headache     followed by Dr. Modesto Charon.  has nerves burned in head and hips  . IC (interstitial cystitis)   . Cancer     melanoma  . Rectal bleeding   . Rectal pain   . Hyperlipidemia   . Osteoporosis   . Thyroid disease   . Abdominal pain   . Constipation   . Lymph node(s) enlarged   . Arthritis pain   . Irritable bowel syndrome (IBS) 02/26/2011  . PONV (postoperative nausea and vomiting)   . Blood transfusion   . Fibromyalgia     states followed by head/pain management  . Arthritis   . Chronic kidney disease     hx kidney stones  . Hemangioma, renal   . Hiatal hernia        ?reflux and ? hiatal hernia/ states has hemangioma on liver x 2-  . Sleep apnea     mild- no cpap  . Hypothyroidism   . Pneumonia     hx of years ago     Past Surgical History  Procedure Date  . Achilles tendon repair 11/12  . Abdominal hysterectomy   . Cholecystectomy   . Tonsillectomy   . Cystoscopy w/ dilation of bladder   . Cystoscopy with hydrodistension and biopsy   . Sp arthro elbow*l*     STATES CANNOT USE LEFT UPPER ARM FOR BLOOD PRESSURES  . Rt thumb    . Diagnostic laparoscopy   . Total thyroidectomy   . Appendectomy   . Left carpal tunnel      BILATERAL  . Cysto with hydrodistension 01/29/2011    Procedure: CYSTOSCOPY/HYDRODISTENSION;  Surgeon: Garnett Farm, MD;  Location: [facility];  Service: Urology;  Laterality: N/A;  . Cystoscopy w/ retrogrades 01/29/2011    Procedure: CYSTOSCOPY WITH RETROGRADE PYELOGRAM;  Surgeon: Garnett Farm, MD;  Location: [facility];  Service: Urology;  Laterality: Bilateral;  . Breast surgery     augmentation x2  . Fissurectomy     x 2  . Lasic     bilateral  . Nasal sinus surgery   . Joint replacement '05     rt knee surgeries x 9  . Right carpal tunnel surgery    Hysterectomy was LAVH, not TAH Laparoscopic BSO Laparoscopy with LOA  Family History  Problem Relation Age of Onset  . Anesthesia problems Mother   . Cancer Mother     ovarian  . Cancer Daughter     cervical  . Cancer Maternal Aunt     breast  Social History:  reports that she quit smoking about 4 years ago. She has never used smokeless tobacco. She reports that she does not drink alcohol or use illicit drugs.  Allergies:  Allergies  Allergen Reactions  . Latex Swelling  . Morphine And Related Itching    States all pain meds cause itching/ STATES HAS TO PREMED WITH BENADRYL  . Prednisone     Yellow 5 Red 4  . Red Dye Hives  . Tape Other (See Comments)    Blisters skin-USE PAPER TAPE  And OP SITE OK  . Yellow Dyes (Non-Tartrazine) Swelling    Hives     Prescriptions prior to admission  Medication Sig Dispense Refill  . ALPRAZolam (XANAX) 0.5 MG tablet Take 0.5 mg by mouth 3 (three) times daily as needed. Anxiety      . calcium carbonate (OS-CAL) 1250 MG chewable tablet Chew 1 tablet by mouth 2 (two) times daily.      . cholecalciferol (VITAMIN D) 1000 UNITS tablet Take 1,000 Units by mouth 2 (two) times daily.       . diphenhydrAMINE (BENADRYL) 50 MG tablet Take 50 mg by mouth  every 8 (eight) hours as needed. Allergies      . estradiol (ESTRACE) 0.5 MG tablet Take 1 mg by mouth at bedtime.       Marland Kitchen HYDROmorphone (DILAUDID) 2 MG tablet Take 2 mg by mouth every 6 (six) hours as needed. Pain      . hydrOXYzine (VISTARIL) 25 MG capsule Take 50 mg by mouth 3 (three) times daily as needed. Allergies      States alternates with Benadryl      . levothyroxine (SYNTHROID, LEVOTHROID) 150 MCG tablet Take 150 mcg by mouth at bedtime.      . Multiple Vitamin (MULITIVITAMIN WITH MINERALS) TABS Take 1 tablet by mouth daily.      . mupirocin ointment (BACTROBAN) 2 %         Review of Systems  Respiratory: Negative.   Cardiovascular: Negative.   Gastrointestinal: Positive for nausea and vomiting.  Genitourinary: Negative.     Blood pressure 129/95, pulse 92, temperature 98.8 F (37.1 C), temperature source Oral, resp. rate 18, SpO2 99.00%. Physical Exam  Constitutional: She appears well-developed and well-nourished.  Neck: Neck supple. No thyromegaly present.  Cardiovascular: Normal rate, regular rhythm and normal heart sounds.   No murmur heard. Respiratory: Effort normal and breath sounds normal. No respiratory distress. She has no wheezes.  GI: Soft. She exhibits no distension and no mass. There is Tenderness: slightly tender upper abdomen..  Genitourinary: Vagina normal.       No adnexal mass or tenderness Vaginal cuff well healed    No results found for this or any previous visit (from the past 24 hour(s)).  No results found.  Assessment/Plan: Recurrent abdominal/pelvic pain.  She has seen Dr. Abbey Chatters as well.  All medical and surgical options have been discussed, she strongly wants to proceed with laparoscopy to evaluate for possible adhesions.  The procedure, risks, chances that surgery may not alleviate her pain have all been discussed.  Will admit for laparoscopy by myself and Dr. Abbey Chatters with possible adhesiolysis.    Aryka Coonradt D 02/11/2012, 8:18  AM

## 2012-02-11 NOTE — Op Note (Signed)
Operative Note  Kari Green female 46 y.o. 02/11/2012  PREOPERATIVE DX:  Epigastric and pelvic pain.  POSTOPERATIVE DX:  Same  PROCEDURE:  Laparoscopic lysis of adhesions         Surgeon: Adolph Pollack   Cosurgeon:  Lavina Hamman, M.D.  Anesthesia: General endotracheal anesthesia  Indications: this is a 46 year old female with pelvic epigastric pain along some nausea and vomiting. She has known delayed gastric emptying. She has had adhesions in the past and lyses of these has helped her pain. She now presents for diagnostic laparoscopy and possible lysis of adhesions.    Procedure Detail:  She was brought to the operating room placed supine on the operating table and a general anesthetic was administered. Abdominal wall was widely sterilely prepped and draped. Dr. Jackelyn Knife indexes to the peritoneal cavity by with open technique the subumbilical region using a Hassan trocar. Notable findings included adhesions between the omentum and right abdominal wall. 25 mm trocars were then placed in the left abdomen.  The adhesions were sharply lysed and bleeding points controlled with cautery.  No significant pelvic pathology was noted. No gross small bowel lesions were noted.  A small hiatal hernia was noted.  Following lysis of adhesions which was about 20-30 minutes, the abdomen was irrigated. There some bleeding from the omentum and this was controlled electrocautery. Hemostasis was adequate after this time.  Following this, a four-quadrant inspection was performed and there was no evidence of bleeding or bowel injury.  The CO2 gas was released and all trocars were removed.  A subumbilical incision was closed by Dr. Jackelyn Knife. The 25 mm skin incisions on the left side were closed with 4-0 Monocryl subcuticular stitches. Dermabond was applied to all wounds.  She tolerated the procedure well without any apparent complications and was taken to recovery in satisfactory  condition.    Findings: There were adhesions between the omentum and right abdominal wall. There was a small hiatal hernia.  Estimated Blood Loss:  less than 100 mL         Drains: none  Blood Given: none          Specimens: none        Complications:  * No complications entered in OR log *         Disposition: PACU - hemodynamically stable.         Condition: stable

## 2012-02-11 NOTE — Anesthesia Preprocedure Evaluation (Signed)
Anesthesia Evaluation  Patient identified by MRN, date of birth, ID band Patient awake    Reviewed: Allergy & Precautions, H&P , NPO status , Patient's Chart, lab work & pertinent test results  History of Anesthesia Complications (+) PONV  Airway Mallampati: II TM Distance: >3 FB Neck ROM: Full    Dental No notable dental hx.    Pulmonary neg pulmonary ROS,  breath sounds clear to auscultation  Pulmonary exam normal       Cardiovascular negative cardio ROS  Rhythm:Regular Rate:Normal     Neuro/Psych Anxiety Narcotic dependent    GI/Hepatic negative GI ROS, Neg liver ROS,   Endo/Other  Hypothyroidism   Renal/GU negative Renal ROS  negative genitourinary   Musculoskeletal  (+) Fibromyalgia -  Abdominal   Peds negative pediatric ROS (+)  Hematology negative hematology ROS (+)   Anesthesia Other Findings   Reproductive/Obstetrics negative OB ROS                           Anesthesia Physical Anesthesia Plan  ASA: II  Anesthesia Plan: General   Post-op Pain Management:    Induction: Intravenous  Airway Management Planned: Oral ETT  Additional Equipment:   Intra-op Plan:   Post-operative Plan: Extubation in OR  Informed Consent: I have reviewed the patients History and Physical, chart, labs and discussed the procedure including the risks, benefits and alternatives for the proposed anesthesia with the patient or authorized representative who has indicated his/her understanding and acceptance.   Dental advisory given  Plan Discussed with: CRNA and Surgeon  Anesthesia Plan Comments:         Anesthesia Quick Evaluation

## 2012-02-11 NOTE — Interval H&P Note (Signed)
History and Physical Interval Note:  02/11/2012 9:41 AM  Kari Green  has presented today for surgery, with the diagnosis of pelvic pain   The various methods of treatment have been discussed with the patient and family. After consideration of risks, benefits and other options for treatment, the patient has consented to  Procedure(s) (LRB) with comments: LAPAROSCOPY DIAGNOSTIC (N/A) - Diagnostic Possible Operative Laparoscopic  as a surgical intervention .  The patient's history has been reviewed, patient examined, no change in status, stable for surgery.  I have reviewed the patient's chart and labs.  Questions were answered to the patient's satisfaction.     [name]

## 2012-02-11 NOTE — Op Note (Signed)
Preoperative diagnosis: Abdominal/pelvic pain Postop diagnosis: Abdominal/pelvic pain with abdominal adhesions Procedure: Diagnostic laparoscopy with adhesiolysis Surgeon: Lavina Hamman M.D. Co-surgeon: Avel Peace M.D. Anesthesia: Gen. Endotracheal tube Findings: She had a normal pelvis with no significant pelvic adhesions whatsoever. Her vaginal cuff is well-healed and evidence of previous hysterectomy and bilateral salpingo-oophorectomy. She had supraumbilical adhesions which Dr. Abbey Chatters addressed Estimated blood loss: Minimal Complications: None  Procedure:  Patient was taken to the operating room and placed in the dorsosupine position. General anesthesia was induced. Abdomen was then prepped and draped in usual sterile fashion and bladder was drained. Infraumbilical skin was infiltrated with quarter percent Marcaine and a 3 cm horizontal incision was made. The fascia was identified and elevated and entered sharply with Mayo scissors. Peritoneal cavity was then identified and entered sharply as well. A pursestring suture of 0 Vicryl was placed around the fascia. The Hassan cannula was then inserted and secured. Abdomen was insufflated with CO2 and inspection with the laparoscope a revealed a normal pelvis. I then assisted Dr. Abbey Chatters with his adhesiolysis. At the end of the procedure, all trocars were removed. The Hassan cannula was removed after gas was allowed to deflate from the abdomen. The pursestring suture was tied and achieved good fascial closure. Skin incisions were closed with interrupted subcuticular sutures of 4-0 Vicryl for the umbilicus and interrupted 4-0 Monocryl for the lateral trocars. Dermabond was then applied. The patient was awakened and taken to the recovery in stable condition. Counts were correct, she tolerated the procedure well and had PAS hose on throughout the procedure.  She received Ancef 2 gms IV prior to the procedure due to a h/o knee replacement.

## 2012-02-11 NOTE — Progress Notes (Signed)
Pt up to bathroom. Unable to void. Bladder scanned for 73cc.

## 2012-02-11 NOTE — Transfer of Care (Signed)
Immediate Anesthesia Transfer of Care Note  Patient: Kari Green  Procedure(s) Performed: Procedure(s) (LRB) with comments: LAPAROSCOPY DIAGNOSTIC (N/A) - Diagnostic  Operative Laparoscopic  LAPAROSCOPIC LYSIS OF ADHESIONS ()  Patient Location: PACU  Anesthesia Type:General  Level of Consciousness: awake and alert   Airway & Oxygen Therapy: Patient Spontanous Breathing and Patient connected to face mask oxygen  Post-op Assessment: Report given to PACU RN and Post -op Vital signs reviewed and stable  Post vital signs: Reviewed and stable  Complications: No apparent anesthesia complications

## 2012-02-14 ENCOUNTER — Encounter (HOSPITAL_COMMUNITY): Payer: Self-pay | Admitting: Obstetrics and Gynecology

## 2012-02-19 DIAGNOSIS — T7840XA Allergy, unspecified, initial encounter: Secondary | ICD-10-CM | POA: Diagnosis not present

## 2012-02-22 DIAGNOSIS — J309 Allergic rhinitis, unspecified: Secondary | ICD-10-CM | POA: Diagnosis not present

## 2012-02-22 DIAGNOSIS — E538 Deficiency of other specified B group vitamins: Secondary | ICD-10-CM | POA: Diagnosis not present

## 2012-03-01 ENCOUNTER — Encounter (INDEPENDENT_AMBULATORY_CARE_PROVIDER_SITE_OTHER): Payer: Managed Care, Other (non HMO) | Admitting: General Surgery

## 2012-03-07 DIAGNOSIS — J309 Allergic rhinitis, unspecified: Secondary | ICD-10-CM | POA: Diagnosis not present

## 2012-03-08 ENCOUNTER — Ambulatory Visit (INDEPENDENT_AMBULATORY_CARE_PROVIDER_SITE_OTHER): Payer: Managed Care, Other (non HMO) | Admitting: General Surgery

## 2012-03-08 ENCOUNTER — Encounter (INDEPENDENT_AMBULATORY_CARE_PROVIDER_SITE_OTHER): Payer: Self-pay | Admitting: General Surgery

## 2012-03-08 VITALS — BP 118/72 | HR 72 | Temp 96.4°F | Resp 16 | Ht 62.0 in | Wt 135.8 lb

## 2012-03-08 DIAGNOSIS — Z9889 Other specified postprocedural states: Secondary | ICD-10-CM

## 2012-03-08 NOTE — Progress Notes (Signed)
Procedure:  Diagnostic laparoscopy with laparoscopic lysis of adhesions  Date:  02/11/2012  Pathology:  na  History:  She is here for her first postoperative visit. She feels much better and is no longer having her preoperative symptoms. She did develop a rash around her incisions. No tape was used. Dermabond was used on the skin. She also had chlor prep. She was discovered to have a small hiatal hernia and she is aware of this.  Exam: General- Is in NAD.  Abdomen-soft, incisions are clean and intact. No rashes present.  Assessment:  Symptoms are improved following laparoscopic lyses of adhesions. Small hiatal hernia noted.  Plan:  Resume normal activities. Dietary instructions given with respect to hiatal hernia and reflux disease. I told her I felt she is allergic to Dermabond surgical glue. Return visit when necessary.

## 2012-03-08 NOTE — Patient Instructions (Signed)
You are allergic to Dermabond surgical glue.  Resume normal activities as tolerated. Avoid acidic and spicy foods. Do not overeat. Do not gain weight.

## 2012-03-11 DIAGNOSIS — M25579 Pain in unspecified ankle and joints of unspecified foot: Secondary | ICD-10-CM | POA: Diagnosis not present

## 2012-03-21 DIAGNOSIS — M542 Cervicalgia: Secondary | ICD-10-CM | POA: Diagnosis not present

## 2012-03-21 DIAGNOSIS — J309 Allergic rhinitis, unspecified: Secondary | ICD-10-CM | POA: Diagnosis not present

## 2012-03-21 DIAGNOSIS — H81399 Other peripheral vertigo, unspecified ear: Secondary | ICD-10-CM | POA: Diagnosis not present

## 2012-03-21 DIAGNOSIS — G541 Lumbosacral plexus disorders: Secondary | ICD-10-CM | POA: Diagnosis not present

## 2012-03-21 DIAGNOSIS — E538 Deficiency of other specified B group vitamins: Secondary | ICD-10-CM | POA: Diagnosis not present

## 2012-03-21 DIAGNOSIS — H8190 Unspecified disorder of vestibular function, unspecified ear: Secondary | ICD-10-CM | POA: Diagnosis not present

## 2012-03-24 DIAGNOSIS — J309 Allergic rhinitis, unspecified: Secondary | ICD-10-CM | POA: Diagnosis not present

## 2012-03-28 DIAGNOSIS — L259 Unspecified contact dermatitis, unspecified cause: Secondary | ICD-10-CM | POA: Diagnosis not present

## 2012-03-31 DIAGNOSIS — J309 Allergic rhinitis, unspecified: Secondary | ICD-10-CM | POA: Diagnosis not present

## 2012-04-03 DIAGNOSIS — M898X9 Other specified disorders of bone, unspecified site: Secondary | ICD-10-CM | POA: Diagnosis not present

## 2012-04-03 DIAGNOSIS — M6789 Other specified disorders of synovium and tendon, multiple sites: Secondary | ICD-10-CM | POA: Diagnosis not present

## 2012-04-03 DIAGNOSIS — M7660 Achilles tendinitis, unspecified leg: Secondary | ICD-10-CM | POA: Diagnosis not present

## 2012-04-03 DIAGNOSIS — M928 Other specified juvenile osteochondrosis: Secondary | ICD-10-CM | POA: Diagnosis not present

## 2012-04-18 DIAGNOSIS — M898X9 Other specified disorders of bone, unspecified site: Secondary | ICD-10-CM | POA: Diagnosis not present

## 2012-04-18 DIAGNOSIS — E538 Deficiency of other specified B group vitamins: Secondary | ICD-10-CM | POA: Diagnosis not present

## 2012-04-18 DIAGNOSIS — J309 Allergic rhinitis, unspecified: Secondary | ICD-10-CM | POA: Diagnosis not present

## 2012-04-26 DIAGNOSIS — M25569 Pain in unspecified knee: Secondary | ICD-10-CM | POA: Diagnosis not present

## 2012-05-01 DIAGNOSIS — J309 Allergic rhinitis, unspecified: Secondary | ICD-10-CM | POA: Diagnosis not present

## 2012-05-09 ENCOUNTER — Other Ambulatory Visit: Payer: Self-pay | Admitting: Dermatology

## 2012-05-09 DIAGNOSIS — L820 Inflamed seborrheic keratosis: Secondary | ICD-10-CM | POA: Diagnosis not present

## 2012-05-09 DIAGNOSIS — D485 Neoplasm of uncertain behavior of skin: Secondary | ICD-10-CM | POA: Diagnosis not present

## 2012-05-09 DIAGNOSIS — Z85828 Personal history of other malignant neoplasm of skin: Secondary | ICD-10-CM | POA: Diagnosis not present

## 2012-05-09 DIAGNOSIS — J309 Allergic rhinitis, unspecified: Secondary | ICD-10-CM | POA: Diagnosis not present

## 2012-05-15 DIAGNOSIS — M542 Cervicalgia: Secondary | ICD-10-CM | POA: Diagnosis not present

## 2012-05-15 DIAGNOSIS — H81399 Other peripheral vertigo, unspecified ear: Secondary | ICD-10-CM | POA: Diagnosis not present

## 2012-05-15 DIAGNOSIS — Z79899 Other long term (current) drug therapy: Secondary | ICD-10-CM | POA: Diagnosis not present

## 2012-05-15 DIAGNOSIS — H8190 Unspecified disorder of vestibular function, unspecified ear: Secondary | ICD-10-CM | POA: Diagnosis not present

## 2012-05-15 DIAGNOSIS — J309 Allergic rhinitis, unspecified: Secondary | ICD-10-CM | POA: Diagnosis not present

## 2012-05-15 DIAGNOSIS — G541 Lumbosacral plexus disorders: Secondary | ICD-10-CM | POA: Diagnosis not present

## 2012-05-15 DIAGNOSIS — E538 Deficiency of other specified B group vitamins: Secondary | ICD-10-CM | POA: Diagnosis not present

## 2012-05-25 DIAGNOSIS — J309 Allergic rhinitis, unspecified: Secondary | ICD-10-CM | POA: Diagnosis not present

## 2012-05-29 DIAGNOSIS — J309 Allergic rhinitis, unspecified: Secondary | ICD-10-CM | POA: Diagnosis not present

## 2012-06-06 DIAGNOSIS — J309 Allergic rhinitis, unspecified: Secondary | ICD-10-CM | POA: Diagnosis not present

## 2012-06-07 ENCOUNTER — Other Ambulatory Visit: Payer: Self-pay

## 2012-06-07 DIAGNOSIS — Z1231 Encounter for screening mammogram for malignant neoplasm of breast: Secondary | ICD-10-CM

## 2012-06-12 DIAGNOSIS — H81399 Other peripheral vertigo, unspecified ear: Secondary | ICD-10-CM | POA: Diagnosis not present

## 2012-06-12 DIAGNOSIS — H8190 Unspecified disorder of vestibular function, unspecified ear: Secondary | ICD-10-CM | POA: Diagnosis not present

## 2012-06-12 DIAGNOSIS — G541 Lumbosacral plexus disorders: Secondary | ICD-10-CM | POA: Diagnosis not present

## 2012-06-12 DIAGNOSIS — M542 Cervicalgia: Secondary | ICD-10-CM | POA: Diagnosis not present

## 2012-06-13 DIAGNOSIS — J309 Allergic rhinitis, unspecified: Secondary | ICD-10-CM | POA: Diagnosis not present

## 2012-06-15 DIAGNOSIS — E538 Deficiency of other specified B group vitamins: Secondary | ICD-10-CM | POA: Diagnosis not present

## 2012-06-20 DIAGNOSIS — J309 Allergic rhinitis, unspecified: Secondary | ICD-10-CM | POA: Diagnosis not present

## 2012-06-22 DIAGNOSIS — M810 Age-related osteoporosis without current pathological fracture: Secondary | ICD-10-CM | POA: Diagnosis not present

## 2012-06-22 DIAGNOSIS — E039 Hypothyroidism, unspecified: Secondary | ICD-10-CM | POA: Diagnosis not present

## 2012-06-22 DIAGNOSIS — F411 Generalized anxiety disorder: Secondary | ICD-10-CM | POA: Diagnosis not present

## 2012-06-22 DIAGNOSIS — E785 Hyperlipidemia, unspecified: Secondary | ICD-10-CM | POA: Diagnosis not present

## 2012-06-26 DIAGNOSIS — L570 Actinic keratosis: Secondary | ICD-10-CM | POA: Diagnosis not present

## 2012-06-26 DIAGNOSIS — D485 Neoplasm of uncertain behavior of skin: Secondary | ICD-10-CM | POA: Diagnosis not present

## 2012-06-26 DIAGNOSIS — N898 Other specified noninflammatory disorders of vagina: Secondary | ICD-10-CM | POA: Diagnosis not present

## 2012-06-26 DIAGNOSIS — Z85828 Personal history of other malignant neoplasm of skin: Secondary | ICD-10-CM | POA: Diagnosis not present

## 2012-06-26 DIAGNOSIS — L821 Other seborrheic keratosis: Secondary | ICD-10-CM | POA: Diagnosis not present

## 2012-06-26 DIAGNOSIS — D235 Other benign neoplasm of skin of trunk: Secondary | ICD-10-CM | POA: Diagnosis not present

## 2012-06-29 DIAGNOSIS — J309 Allergic rhinitis, unspecified: Secondary | ICD-10-CM | POA: Diagnosis not present

## 2012-06-29 DIAGNOSIS — E538 Deficiency of other specified B group vitamins: Secondary | ICD-10-CM | POA: Diagnosis not present

## 2012-06-30 DIAGNOSIS — J309 Allergic rhinitis, unspecified: Secondary | ICD-10-CM | POA: Diagnosis not present

## 2012-07-05 DIAGNOSIS — J309 Allergic rhinitis, unspecified: Secondary | ICD-10-CM | POA: Diagnosis not present

## 2012-07-06 ENCOUNTER — Ambulatory Visit
Admission: RE | Admit: 2012-07-06 | Discharge: 2012-07-06 | Disposition: A | Discharge status: Home / Self Care | Payer: Managed Care, Other (non HMO) | Source: Ambulatory Visit

## 2012-07-06 DIAGNOSIS — Z1231 Encounter for screening mammogram for malignant neoplasm of breast: Secondary | ICD-10-CM

## 2012-07-10 DIAGNOSIS — H81399 Other peripheral vertigo, unspecified ear: Secondary | ICD-10-CM | POA: Diagnosis not present

## 2012-07-10 DIAGNOSIS — M542 Cervicalgia: Secondary | ICD-10-CM | POA: Diagnosis not present

## 2012-07-10 DIAGNOSIS — G541 Lumbosacral plexus disorders: Secondary | ICD-10-CM | POA: Diagnosis not present

## 2012-07-10 DIAGNOSIS — H8190 Unspecified disorder of vestibular function, unspecified ear: Secondary | ICD-10-CM | POA: Diagnosis not present

## 2012-07-11 DIAGNOSIS — M25579 Pain in unspecified ankle and joints of unspecified foot: Secondary | ICD-10-CM | POA: Diagnosis not present

## 2012-07-11 DIAGNOSIS — M7730 Calcaneal spur, unspecified foot: Secondary | ICD-10-CM | POA: Diagnosis not present

## 2012-09-12 ENCOUNTER — Encounter (INDEPENDENT_AMBULATORY_CARE_PROVIDER_SITE_OTHER): Payer: Self-pay | Admitting: General Surgery

## 2012-09-12 ENCOUNTER — Ambulatory Visit (INDEPENDENT_AMBULATORY_CARE_PROVIDER_SITE_OTHER): Payer: Managed Care, Other (non HMO) | Admitting: General Surgery

## 2012-09-12 VITALS — BP 120/78 | HR 68 | Resp 14 | Ht 63.0 in | Wt 140.6 lb

## 2012-09-12 DIAGNOSIS — K6289 Other specified diseases of anus and rectum: Secondary | ICD-10-CM

## 2012-09-12 NOTE — Patient Instructions (Signed)
Clean your bottom with warm moist tissue paper after bowel movements. Apply Desitin with aloe or Zinc oxide to your anal area until it heals.

## 2012-09-12 NOTE — Progress Notes (Signed)
Patient ID: Kari Green, female   DOB: [birth year], 47 y.o.   MRN: [id number]  Chief Complaint  Patient presents with  . Routine Post Op    urgent poss throm hems    HPI Kari Green is a 47 y.o. female.   HPI   She is self-referred. She's having some anal swelling and burning. For approximately the past week she's had viral gastroenteritis and has had significant diarrhea. She's been cleaning her bottom multiple times a day with medicated wipes. She states there is an area around her anus that burns and feels a little bit swollen. She had little bit of bleeding as well.   Past Medical History  Diagnosis Date  . Anxiety   . Headache(784.0)     followed by Dr. Modesto Charon.  has nerves burned in head and hips  . IC (interstitial cystitis)   . Cancer     melanoma  . Rectal bleeding   . Rectal pain   . Hyperlipidemia   . Osteoporosis   . Thyroid disease   . Abdominal pain   . Constipation   . Lymph node(s) enlarged   . Arthritis pain   . Irritable bowel syndrome (IBS) 02/26/2011  . PONV (postoperative nausea and vomiting)   . Blood transfusion   . Fibromyalgia     states followed by head/pain management  . Arthritis   . Chronic kidney disease     hx kidney stones  . Hemangioma, renal   . Hiatal hernia        ?reflux and ? hiatal hernia/ states has hemangioma on liver x 2-  . Sleep apnea     mild- no cpap  . Hypothyroidism   . Pneumonia     hx of years ago     Past Surgical History  Procedure Laterality Date  . Achilles tendon repair  11/12  . Abdominal hysterectomy    . Cholecystectomy    . Tonsillectomy    . Cystoscopy w/ dilation of bladder    . Cystoscopy with hydrodistension and biopsy    . Sp arthro elbow*l*      STATES CANNOT USE LEFT UPPER ARM FOR BLOOD PRESSURES  . Rt thumb    . Diagnostic laparoscopy    . Total thyroidectomy    . Appendectomy    . Left carpal tunnel       BILATERAL  . Cysto with hydrodistension  01/29/2011    Procedure:  CYSTOSCOPY/HYDRODISTENSION;  Surgeon: Garnett Farm, MD;  Location: [facility];  Service: Urology;  Laterality: N/A;  . Cystoscopy w/ retrogrades  01/29/2011    Procedure: CYSTOSCOPY WITH RETROGRADE PYELOGRAM;  Surgeon: Garnett Farm, MD;  Location: [facility];  Service: Urology;  Laterality: Bilateral;  . Breast surgery      augmentation x2  . Fissurectomy      x 2  . Lasic      bilateral  . Nasal sinus surgery    . Right carpal tunnel surgery     . Laparoscopy  02/11/2012    Procedure: LAPAROSCOPY DIAGNOSTIC;  Surgeon: Lavina Hamman, MD;  Location: [facility];  Service: Gynecology;  Laterality: N/A;  Diagnostic  Operative Laparoscopic   . Laparoscopic lysis of adhesions  02/11/2012    Procedure: LAPAROSCOPIC LYSIS OF ADHESIONS;  Surgeon: Lavina Hamman, MD;  Location: [facility];  Service: Gynecology;;  . Joint replacement  '05     rt knee surgeries x 9    Family History  Problem Relation Age of Onset  . Anesthesia problems Mother   . Cancer Mother     ovarian  . Cancer Daughter     cervical  . Cancer Maternal Aunt     breast    Social History History  Substance Use Topics  . Smoking status: Former Smoker    Quit date: 01/25/2008  . Smokeless tobacco: Never Used  . Alcohol Use: No    Allergies  Allergen Reactions  . Latex Swelling  . Morphine And Related Itching    States all pain meds cause itching/ STATES HAS TO PREMED WITH BENADRYL  . Prednisone     Yellow 5 Red 4  . Red Dye Hives  . Tape Other (See Comments)    Blisters skin-USE PAPER TAPE  And OP SITE OK  . Yellow Dyes (Non-Tartrazine) Swelling    Hives   . Other Rash    dermabond    Current Outpatient Prescriptions  Medication Sig Dispense Refill  . ALPRAZolam (XANAX) 0.5 MG tablet Take 0.5 mg by mouth 3 (three) times daily as needed. Anxiety      . calcium carbonate (OS-CAL) 1250 MG chewable tablet Chew 1 tablet by mouth 2 (two) times daily.      . cholecalciferol (VITAMIN  D) 1000 UNITS tablet Take 1,000 Units by mouth 2 (two) times daily.       . diphenhydrAMINE (BENADRYL) 50 MG tablet Take 50 mg by mouth every 8 (eight) hours as needed. Allergies      . estradiol (ESTRACE) 0.5 MG tablet Take 1 mg by mouth at bedtime.       Marland Kitchen HYDROmorphone (DILAUDID) 2 MG tablet Take 2 mg by mouth every 6 (six) hours as needed. Pain      . hydrOXYzine (ATARAX/VISTARIL) 25 MG tablet       . levothyroxine (SYNTHROID, LEVOTHROID) 150 MCG tablet Take 150 mcg by mouth at bedtime.      . Multiple Vitamin (MULITIVITAMIN WITH MINERALS) TABS Take 1 tablet by mouth daily.      . mupirocin ointment (BACTROBAN) 2 %        No current facility-administered medications for this visit.    Review of Systems Review of Systems  Gastrointestinal: Positive for abdominal pain, diarrhea and rectal pain.    Blood pressure 120/78, pulse 68, resp. rate 14, height 5\' 3"  (1.6 m), weight 140 lb 9.6 oz (63.776 kg).  Physical Exam Physical Exam  Constitutional: She appears well-developed and well-nourished. No distress.  Genitourinary:  There is a very small thrombosed external hemorrhoid that is soft in the right anterior lateral area. On the left perianal skin there is a superficial wound that looks like a second-degree burn. This is most likely from trauma from the wipes she's been using.    Data Reviewed none  Assessment    Very small soft external thrombosed external hemorrhoid that requires no further treatment. Skin breakdown secondary to trauma from cleaning the area on the left side.     Plan    Applied Desitin or zinc oxide to the area until it heals. Use to soft tissue paper with water on it only to clean the anal area after bowel movements. Return as needed.        Kari Green J 09/12/2012, 5:42 PM

## 2012-09-19 DIAGNOSIS — R05 Cough: Secondary | ICD-10-CM | POA: Diagnosis not present

## 2012-09-19 DIAGNOSIS — R059 Cough, unspecified: Secondary | ICD-10-CM | POA: Diagnosis not present

## 2012-09-21 ENCOUNTER — Observation Stay (HOSPITAL_COMMUNITY)
Admission: EM | Admit: 2012-09-21 | Discharge: 2012-09-22 | Disposition: A | Discharge status: Home / Self Care | Payer: Managed Care, Other (non HMO) | Attending: Internal Medicine | Admitting: Internal Medicine

## 2012-09-21 ENCOUNTER — Emergency Department (HOSPITAL_COMMUNITY): Payer: Managed Care, Other (non HMO)

## 2012-09-21 ENCOUNTER — Encounter (HOSPITAL_COMMUNITY): Payer: Self-pay | Admitting: Emergency Medicine

## 2012-09-21 DIAGNOSIS — K648 Other hemorrhoids: Secondary | ICD-10-CM

## 2012-09-21 DIAGNOSIS — K581 Irritable bowel syndrome with constipation: Secondary | ICD-10-CM

## 2012-09-21 DIAGNOSIS — R1013 Epigastric pain: Secondary | ICD-10-CM

## 2012-09-21 DIAGNOSIS — D72829 Elevated white blood cell count, unspecified: Secondary | ICD-10-CM | POA: Diagnosis present

## 2012-09-21 DIAGNOSIS — R0789 Other chest pain: Principal | ICD-10-CM | POA: Insufficient documentation

## 2012-09-21 DIAGNOSIS — R0602 Shortness of breath: Secondary | ICD-10-CM | POA: Diagnosis not present

## 2012-09-21 DIAGNOSIS — K224 Dyskinesia of esophagus: Secondary | ICD-10-CM

## 2012-09-21 DIAGNOSIS — IMO0002 Reserved for concepts with insufficient information to code with codable children: Secondary | ICD-10-CM | POA: Insufficient documentation

## 2012-09-21 DIAGNOSIS — R079 Chest pain, unspecified: Secondary | ICD-10-CM | POA: Diagnosis not present

## 2012-09-21 DIAGNOSIS — J189 Pneumonia, unspecified organism: Secondary | ICD-10-CM | POA: Insufficient documentation

## 2012-09-21 DIAGNOSIS — J440 Chronic obstructive pulmonary disease with acute lower respiratory infection: Secondary | ICD-10-CM | POA: Insufficient documentation

## 2012-09-21 DIAGNOSIS — Z79899 Other long term (current) drug therapy: Secondary | ICD-10-CM | POA: Insufficient documentation

## 2012-09-21 DIAGNOSIS — E039 Hypothyroidism, unspecified: Secondary | ICD-10-CM | POA: Diagnosis present

## 2012-09-21 DIAGNOSIS — K660 Peritoneal adhesions (postprocedural) (postinfection): Secondary | ICD-10-CM

## 2012-09-21 DIAGNOSIS — J209 Acute bronchitis, unspecified: Secondary | ICD-10-CM | POA: Insufficient documentation

## 2012-09-21 DIAGNOSIS — K6289 Other specified diseases of anus and rectum: Secondary | ICD-10-CM

## 2012-09-21 DIAGNOSIS — J449 Chronic obstructive pulmonary disease, unspecified: Secondary | ICD-10-CM | POA: Diagnosis present

## 2012-09-21 LAB — POCT I-STAT, CHEM 8
Creatinine, Ser: 0.7 mg/dL (ref 0.50–1.10)
Hemoglobin: 15 g/dL (ref 12.0–15.0)
Potassium: 3.6 mEq/L (ref 3.5–5.1)
Sodium: 142 mEq/L (ref 135–145)

## 2012-09-21 LAB — CBC
Hemoglobin: 14 g/dL (ref 12.0–15.0)
MCHC: 33.1 g/dL (ref 30.0–36.0)
RBC: 4.68 MIL/uL (ref 3.87–5.11)

## 2012-09-21 LAB — POCT I-STAT TROPONIN I: Troponin i, poc: 0 ng/mL (ref 0.00–0.08)

## 2012-09-21 MED ORDER — BUDESONIDE-FORMOTEROL FUMARATE 160-4.5 MCG/ACT IN AERO
2.0000 | INHALATION_SPRAY | Freq: Two times a day (BID) | RESPIRATORY_TRACT | Status: DC
  Administered 2012-09-21 – 2012-09-22 (×2): 2 via RESPIRATORY_TRACT
  Filled 2012-09-21: qty 6

## 2012-09-21 MED ORDER — ONDANSETRON HCL 4 MG/2ML IJ SOLN: 4.0000 mg | Freq: Four times a day (QID) | INTRAMUSCULAR | Status: DC | PRN

## 2012-09-21 MED ORDER — GI COCKTAIL ~~LOC~~: 30.0000 mL | Freq: Four times a day (QID) | ORAL | Status: DC | PRN

## 2012-09-21 MED ORDER — LEVOTHYROXINE SODIUM 50 MCG PO TABS
150.0000 ug | ORAL_TABLET | Freq: Every day | ORAL | Status: DC
  Administered 2012-09-21: 150 ug via ORAL
  Filled 2012-09-21 (×2): qty 3

## 2012-09-21 MED ORDER — DIPHENHYDRAMINE HCL 25 MG PO CAPS
25.0000 mg | ORAL_CAPSULE | Freq: Once | ORAL | Status: DC
  Filled 2012-09-21: qty 1

## 2012-09-21 MED ORDER — HYDROCOD POLST-CHLORPHEN POLST 10-8 MG/5ML PO LQCR
5.0000 mL | Freq: Two times a day (BID) | ORAL | Status: DC | PRN
  Administered 2012-09-22: 5 mL via ORAL
  Filled 2012-09-21: qty 5

## 2012-09-21 MED ORDER — ALPRAZOLAM 0.5 MG PO TABS: 0.5000 mg | ORAL_TABLET | Freq: Three times a day (TID) | ORAL | Status: DC | PRN

## 2012-09-21 MED ORDER — ZOLPIDEM TARTRATE 5 MG PO TABS: 5.0000 mg | ORAL_TABLET | Freq: Every evening | ORAL | Status: DC | PRN

## 2012-09-21 MED ORDER — ACETAMINOPHEN 325 MG PO TABS: 650.0000 mg | ORAL_TABLET | ORAL | Status: DC | PRN

## 2012-09-21 MED ORDER — PANTOPRAZOLE SODIUM 40 MG IV SOLR
40.0000 mg | INTRAVENOUS | Status: DC
  Administered 2012-09-21: 40 mg via INTRAVENOUS
  Filled 2012-09-21 (×2): qty 40

## 2012-09-21 MED ORDER — ALBUTEROL SULFATE HFA 108 (90 BASE) MCG/ACT IN AERS
2.0000 | INHALATION_SPRAY | Freq: Four times a day (QID) | RESPIRATORY_TRACT | Status: DC | PRN
  Administered 2012-09-21 – 2012-09-22 (×2): 2 via RESPIRATORY_TRACT
  Filled 2012-09-21: qty 6.7

## 2012-09-21 MED ORDER — ENOXAPARIN SODIUM 40 MG/0.4ML ~~LOC~~ SOLN
40.0000 mg | SUBCUTANEOUS | Status: DC
  Administered 2012-09-22: 40 mg via SUBCUTANEOUS
  Filled 2012-09-21: qty 0.4

## 2012-09-21 MED ORDER — HYDROMORPHONE HCL 2 MG PO TABS
2.0000 mg | ORAL_TABLET | Freq: Four times a day (QID) | ORAL | Status: DC | PRN
  Administered 2012-09-22: 2 mg via ORAL
  Filled 2012-09-21: qty 1

## 2012-09-21 MED ORDER — DIPHENHYDRAMINE HCL 50 MG/ML IJ SOLN
25.0000 mg | Freq: Three times a day (TID) | INTRAMUSCULAR | Status: DC | PRN
  Administered 2012-09-22: 25 mg via INTRAVENOUS
  Filled 2012-09-21: qty 1

## 2012-09-21 MED ORDER — LEVOFLOXACIN 500 MG PO TABS
500.0000 mg | ORAL_TABLET | Freq: Every day | ORAL | Status: DC
  Administered 2012-09-22: 500 mg via ORAL
  Filled 2012-09-21: qty 1

## 2012-09-21 MED ORDER — TOPIRAMATE 25 MG PO TABS
50.0000 mg | ORAL_TABLET | Freq: Two times a day (BID) | ORAL | Status: DC
  Administered 2012-09-21 – 2012-09-22 (×2): 25 mg via ORAL
  Filled 2012-09-21 (×3): qty 2

## 2012-09-21 MED ORDER — DIPHENHYDRAMINE HCL 25 MG PO CAPS: 50.0000 mg | ORAL_CAPSULE | Freq: Three times a day (TID) | ORAL | Status: DC | PRN

## 2012-09-21 MED ORDER — ASPIRIN 81 MG PO CHEW
324.0000 mg | CHEWABLE_TABLET | Freq: Once | ORAL | Status: AC
  Administered 2012-09-21: 324 mg via ORAL
  Filled 2012-09-21: qty 4

## 2012-09-21 NOTE — ED Provider Notes (Signed)
CSN: [id number]     Arrival date & time 09/21/12  1733 History     First MD Initiated Contact with Patient 09/21/12 1740     Chief Complaint  Patient presents with  . Shortness of Breath   (Consider location/radiation/quality/duration/timing/severity/associated sxs/prior Treatment) Patient is a 47 y.o. female presenting with chest pain. The history is provided by the patient and the EMS personnel.  Chest Pain Pain location:  Substernal area Pain quality: pressure   Pain radiates to:  Does not radiate Pain severity:  Moderate Onset quality:  Gradual Duration:  2 days Timing:  Intermittent Progression:  Resolved Chronicity:  New Relieved by:  Nitroglycerin Worsened by:  Exertion Ineffective treatments:  None tried Associated symptoms: shortness of breath   Associated symptoms: no abdominal pain, no back pain, no cough, no diaphoresis, no dizziness, no dysphagia, no fatigue, no fever, no headache, no nausea, no numbness, no palpitations and not vomiting     Past Medical History  Diagnosis Date  . Anxiety   . Headache(784.0)     followed by Dr. Modesto Charon.  has nerves burned in head and hips  . IC (interstitial cystitis)   . Cancer     melanoma  . Rectal bleeding   . Rectal pain   . Hyperlipidemia   . Osteoporosis   . Thyroid disease   . Abdominal pain   . Constipation   . Lymph node(s) enlarged   . Arthritis pain   . Irritable bowel syndrome (IBS) 02/26/2011  . PONV (postoperative nausea and vomiting)   . Blood transfusion   . Fibromyalgia     states followed by head/pain management  . Arthritis   . Chronic kidney disease     hx kidney stones  . Hemangioma, renal   . Hiatal hernia        ?reflux and ? hiatal hernia/ states has hemangioma on liver x 2-  . Sleep apnea     mild- no cpap  . Hypothyroidism   . Pneumonia     hx of years ago    Past Surgical History  Procedure Laterality Date  . Achilles tendon repair  11/12  . Abdominal hysterectomy    .  Cholecystectomy    . Tonsillectomy    . Cystoscopy w/ dilation of bladder    . Cystoscopy with hydrodistension and biopsy    . Sp arthro elbow*l*      STATES CANNOT USE LEFT UPPER ARM FOR BLOOD PRESSURES  . Rt thumb    . Diagnostic laparoscopy    . Total thyroidectomy    . Appendectomy    . Left carpal tunnel       BILATERAL  . Cysto with hydrodistension  01/29/2011    Procedure: CYSTOSCOPY/HYDRODISTENSION;  Surgeon: Garnett Farm, MD;  Location: [facility];  Service: Urology;  Laterality: N/A;  . Cystoscopy w/ retrogrades  01/29/2011    Procedure: CYSTOSCOPY WITH RETROGRADE PYELOGRAM;  Surgeon: Garnett Farm, MD;  Location: [facility];  Service: Urology;  Laterality: Bilateral;  . Breast surgery      augmentation x2  . Fissurectomy      x 2  . Lasic      bilateral  . Nasal sinus surgery    . Right carpal tunnel surgery     . Laparoscopy  02/11/2012    Procedure: LAPAROSCOPY DIAGNOSTIC;  Surgeon: Lavina Hamman, MD;  Location: [facility];  Service: Gynecology;  Laterality: N/A;  Diagnostic  Operative Laparoscopic   .  Laparoscopic lysis of adhesions  02/11/2012    Procedure: LAPAROSCOPIC LYSIS OF ADHESIONS;  Surgeon: Lavina Hamman, MD;  Location: [facility];  Service: Gynecology;;  . Joint replacement  '05     rt knee surgeries x 9   Family History  Problem Relation Age of Onset  . Anesthesia problems Mother   . Cancer Mother     ovarian  . Cancer Daughter     cervical  . Cancer Maternal Aunt     breast   History  Substance Use Topics  . Smoking status: Former Smoker    Quit date: 01/25/2008  . Smokeless tobacco: Never Used  . Alcohol Use: No   OB History   Grav Para Term Preterm Abortions TAB SAB Ect Mult Living                 Review of Systems  Constitutional: Negative for fever, chills, diaphoresis and fatigue.  HENT: Negative for ear pain, congestion, sore throat, facial swelling, mouth sores, trouble swallowing, neck pain and neck  stiffness.   Eyes: Negative.   Respiratory: Positive for shortness of breath. Negative for apnea, cough, chest tightness and wheezing.   Cardiovascular: Positive for chest pain. Negative for palpitations and leg swelling.  Gastrointestinal: Negative for nausea, vomiting, abdominal pain, diarrhea and abdominal distention.  Genitourinary: Negative for hematuria, flank pain, vaginal discharge, difficulty urinating and menstrual problem.  Musculoskeletal: Negative for back pain and gait problem.  Skin: Negative for rash and wound.  Neurological: Negative for dizziness, tremors, seizures, syncope, facial asymmetry, numbness and headaches.  Psychiatric/Behavioral: Negative.   All other systems reviewed and are negative.    Allergies  Cortisone; Latex; Morphine and related; Prednisone; Red dye; Tape; Yellow dyes (non-tartrazine); and Other  Home Medications   Current Outpatient Rx  Name  Route  Sig  Dispense  Refill  . albuterol (PROVENTIL HFA;VENTOLIN HFA) 108 (90 BASE) MCG/ACT inhaler   Inhalation   Inhale 2 puffs into the lungs every 6 (six) hours as needed for wheezing.         Marland Kitchen ALPRAZolam (XANAX) 0.5 MG tablet   Oral   Take 0.5 mg by mouth 3 (three) times daily as needed. Anxiety         . budesonide-formoterol (SYMBICORT) 160-4.5 MCG/ACT inhaler   Inhalation   Inhale 2 puffs into the lungs 2 (two) times daily.         . calcium carbonate (OS-CAL) 1250 MG chewable tablet   Oral   Chew 1 tablet by mouth 2 (two) times daily.         . chlorpheniramine-HYDROcodone (TUSSIONEX) 10-8 MG/5ML LQCR   Oral   Take 5 mLs by mouth every 12 (twelve) hours as needed. For cough         . cholecalciferol (VITAMIN D) 1000 UNITS tablet   Oral   Take 1,000 Units by mouth 2 (two) times daily.          . diphenhydrAMINE (BENADRYL) 50 MG tablet   Oral   Take 50 mg by mouth See admin instructions. Take 30 mins prior to allergy shot every Tuesday for itching. Alternates with  hydroxyzine.         Marland Kitchen estradiol (ESTRACE) 0.5 MG tablet   Oral   Take 1 mg by mouth at bedtime.          Marland Kitchen HYDROmorphone (DILAUDID) 2 MG tablet   Oral   Take 2 mg by mouth every 6 (six) hours as needed. Pain         .  hydrOXYzine (ATARAX/VISTARIL) 25 MG tablet   Oral   Take 25 mg by mouth See admin instructions. Takes every Tues 30 mins prior to allergy shot for itching. Alternates with Benadryl.         Marland Kitchen levofloxacin (LEVAQUIN) 500 MG tablet   Oral   Take 500 mg by mouth daily.         Marland Kitchen levothyroxine (SYNTHROID, LEVOTHROID) 50 MCG tablet   Oral   Take 150 mcg by mouth daily before breakfast. BRAND NAME ONLY         . Multiple Vitamin (MULITIVITAMIN WITH MINERALS) TABS   Oral   Take 1 tablet by mouth daily.         . predniSONE (DELTASONE) 10 MG tablet   Oral   Take 10 mg by mouth as directed.         . topiramate (TOPAMAX) 25 MG tablet   Oral   Take 25 mg by mouth 2 (two) times daily.         . zoledronic acid (RECLAST) 5 MG/100ML SOLN   Intravenous   Inject 5 mg into the vein once.          BP 145/90  Pulse 92  Temp(Src) 98.4 F (36.9 C) (Oral)  Resp 18  SpO2 100% Physical Exam  Nursing note and vitals reviewed. Constitutional: She is oriented to person, place, and time. She appears well-developed and well-nourished. No distress.  HENT:  Head: Normocephalic and atraumatic.  Right Ear: External ear normal.  Left Ear: External ear normal.  Nose: Nose normal.  Mouth/Throat: Oropharynx is clear and moist. No oropharyngeal exudate.  Eyes: Conjunctivae and EOM are normal. Pupils are equal, round, and reactive to light. Right eye exhibits no discharge. Left eye exhibits no discharge.  Neck: Normal range of motion. Neck supple. No JVD present. No tracheal deviation present. No thyromegaly present.  Cardiovascular: Normal rate, regular rhythm, normal heart sounds and intact distal pulses.  Exam reveals no gallop and no friction rub.   No murmur  heard. Pulmonary/Chest: Effort normal and breath sounds normal. No respiratory distress. She has no wheezes. She has no rales. She exhibits no tenderness.  Abdominal: Soft. Bowel sounds are normal. She exhibits no distension. There is no tenderness. There is no rebound and no guarding.  Musculoskeletal: Normal range of motion.  Lymphadenopathy:    She has no cervical adenopathy.  Neurological: She is alert and oriented to person, place, and time. No cranial nerve deficit. Coordination normal.  Skin: Skin is warm. No rash noted. She is not diaphoretic.  Psychiatric: She has a normal mood and affect. Her behavior is normal. Judgment and thought content normal.    ED Course   Procedures (including critical care time)  Labs Reviewed  CBC   No results found. No diagnosis found.  MDM  47 yr old female patient with recently diagnosed PNA here with chest heaviness, and shortness of breath with exertion. She is on levaquin. Says for the past 2 days has been having increased chest heaviness and SOB. EKG with some lateral ekg depression and t wave inversions that seem to be new. Concern for ACS or possibly worsening PNA. Will give ASA and will repeat CXR. Will cardiac labs.  EKG: there are no previous tracings available for comparison, normal sinus rhythm, nonspecific ST and T waves changes.  Given concerning story and EKg changes at PCP will admit to observation to continue to evaluate possible cardiac etiology to symptoms. Normal CXR. Negative troponin  initially and normal labs  Results for orders placed during the hospital encounter of 09/21/12  CBC      Result Value Range   WBC 11.4 (*) 4.0 - 10.5 K/uL   RBC 4.68  3.87 - 5.11 MIL/uL   Hemoglobin 14.0  12.0 - 15.0 g/dL   HCT 16.1  09.6 - 04.5 %   MCV 90.4  78.0 - 100.0 fL   MCH 29.9  26.0 - 34.0 pg   MCHC 33.1  30.0 - 36.0 g/dL   RDW 40.9  81.1 - 91.4 %   Platelets 305  150 - 400 K/uL  POCT I-STAT, CHEM 8      Result Value Range    Sodium 142  135 - 145 mEq/L   Potassium 3.6  3.5 - 5.1 mEq/L   Chloride 113 (*) 96 - 112 mEq/L   BUN 18  6 - 23 mg/dL   Creatinine, Ser 7.82  0.50 - 1.10 mg/dL   Glucose, Bld 956 (*) 70 - 99 mg/dL   Calcium, Ion 2.13  0.86 - 1.23 mmol/L   TCO2 19  0 - 100 mmol/L   Hemoglobin 15.0  12.0 - 15.0 g/dL   HCT 57.8  46.9 - 62.9 %  POCT I-STAT TROPONIN I      Result Value Range   Troponin i, poc 0.00  0.00 - 0.08 ng/mL   Comment 3             DG Chest 2 View (Final result)  Result time: 09/21/12 19:26:02    Final result by Rad Results In Interface (09/21/12 19:26:02)    Narrative:   *RADIOLOGY REPORT*  Clinical Data: Shortness of breath  CHEST - 2 VIEW  Comparison: September 19, 2012  Findings: There is no focal infiltrate, pulmonary edema, or pleural effusion. The mediastinal contour and cardiac silhouette are normal. Postsurgical changes of the lower cervical spine are unchanged. The patient is status post prior cholecystectomy with surgical clips in the right upper quadrant. The soft tissues and osseous structures are stable.  IMPRESSION: No acute cardiopulmonary disease identified.   Original Report Authenticated By: Sherian Rein, M.D.      Case discussed with Dr. Ethelda Chick.  Sherryl Manges, MD 09/21/12 2011

## 2012-09-21 NOTE — ED Notes (Signed)
PT from [facility]. C/o sob and chest tightness with exertion. EKG showed t- wave inversion, received 2 nitro spray sent here for further eval.

## 2012-09-21 NOTE — ED Notes (Signed)
Attempted to place piv. Unsuccessful. Due to hx. Of difficult sticks and reason for admission, iv team called to place piv.

## 2012-09-21 NOTE — ED Provider Notes (Signed)
Complains of chest tightness anterior onset today accompanied by shortness of breath and generally weak.Kari Green She was seen at her primary care physician's office at [facility]. Administered nitroglycerin spray with relief discomfort. She presently is asymptomatic except feels generally weak.  Doug Sou, MD 09/21/12 [phone]

## 2012-09-21 NOTE — H&P (Signed)
[facility] History and Physical  Kari Green  ZOX:[id number]  DOB: May 28, 1965  DOA: 09/21/2012  Referring physician: Dr. Ethelda Chick PCP: Thayer Headings, MD   Chief Complaint: Chest tightness  HPI: Kari Green is a 47 y.o. female with Past medical history of dyslipidemia, hypothyroidism, significant allergy to dye, migraine. She presented today to her PCPs office with the complaint of chest pain that was substernal in location moderate in severity nonradiating, associated with exertion and shortness of breath and diaphoresis, She denies any similar pain in the past. She has recently been complaining of cough and shortness of breath for which she has been seen by her PCP who has performed an x-ray, the patient was informed that she has pneumonia and was started on levofloxacin 500 mg daily and prednisone taper along with Symbicort and albuterol inhalers. Patient has completed 3 days of treatment with both. Patient denies active smoking or alcohol abuse. She mentions that she has extensive GI workup in the past for acid reflux but recent EGD last year she follows up with Dr. Madilyn Fireman. And has been informed that she has esophageal functional disorder, and no GERD. She denies any fever, chills, dizziness, lightheadedness, palpitation, abdominal pain, nausea, vomiting, diarrhea, urinary symptoms, orthopnea, PND, leg swelling.  Review of Systems: as mentioned in the history of present illness.  A Comprehensive review of the other systems is negative.  Past Medical History  Diagnosis Date  . Anxiety   . Headache(784.0)     followed by Dr. Modesto Charon.  has nerves burned in head and hips  . IC (interstitial cystitis)   . Cancer     melanoma  . Rectal bleeding   . Rectal pain   . Hyperlipidemia   . Osteoporosis   . Thyroid disease   . Abdominal pain   . Constipation   . Lymph node(s) enlarged   . Arthritis pain   . Irritable bowel syndrome (IBS) 02/26/2011  . PONV  (postoperative nausea and vomiting)   . Blood transfusion   . Fibromyalgia     states followed by head/pain management  . Arthritis   . Chronic kidney disease     hx kidney stones  . Hemangioma, renal   . Hiatal hernia        ?reflux and ? hiatal hernia/ states has hemangioma on liver x 2-  . Sleep apnea     mild- no cpap  . Hypothyroidism   . Pneumonia     hx of years ago    Past Surgical History  Procedure Laterality Date  . Achilles tendon repair  11/12  . Abdominal hysterectomy    . Cholecystectomy    . Tonsillectomy    . Cystoscopy w/ dilation of bladder    . Cystoscopy with hydrodistension and biopsy    . Sp arthro elbow*l*      STATES CANNOT USE LEFT UPPER ARM FOR BLOOD PRESSURES  . Rt thumb    . Diagnostic laparoscopy    . Total thyroidectomy    . Appendectomy    . Left carpal tunnel       BILATERAL  . Cysto with hydrodistension  01/29/2011    Procedure: CYSTOSCOPY/HYDRODISTENSION;  Surgeon: Garnett Farm, MD;  Location: [facility];  Service: Urology;  Laterality: N/A;  . Cystoscopy w/ retrogrades  01/29/2011    Procedure: CYSTOSCOPY WITH RETROGRADE PYELOGRAM;  Surgeon: Garnett Farm, MD;  Location: [facility];  Service: Urology;  Laterality: Bilateral;  . Breast surgery  augmentation x2  . Fissurectomy      x 2  . Lasic      bilateral  . Nasal sinus surgery    . Right carpal tunnel surgery     . Laparoscopy  02/11/2012    Procedure: LAPAROSCOPY DIAGNOSTIC;  Surgeon: Lavina Hamman, MD;  Location: [facility];  Service: Gynecology;  Laterality: N/A;  Diagnostic  Operative Laparoscopic   . Laparoscopic lysis of adhesions  02/11/2012    Procedure: LAPAROSCOPIC LYSIS OF ADHESIONS;  Surgeon: Lavina Hamman, MD;  Location: [facility];  Service: Gynecology;;  . Joint replacement  '05     rt knee surgeries x 9   Social History:  reports that she quit smoking about 4 years ago. She has never used smokeless tobacco. She reports that she  does not drink alcohol or use illicit drugs. Patient is coming from home. Patient can participate in ADLs.  Allergies  Allergen Reactions  . Cortisone Hives and Swelling  . Latex Hives and Swelling  . Morphine And Related Itching    States all pain meds cause itching/ STATES HAS TO PREMED WITH BENADRYL  . Prednisone Other (See Comments)    Needs to be dye free: Yellow 5 Red 4  . Red Dye Hives  . Tape Other (See Comments)    Blisters skin-USE PAPER TAPE  And OP SITE OK  . Yellow Dyes (Non-Tartrazine) Swelling    Hives   . Other Rash    dermabond-severe burn    Family History  Problem Relation Age of Onset  . Anesthesia problems Mother   . Cancer Mother     ovarian  . Cancer Daughter     cervical  . Cancer Maternal Aunt     breast    Prior to Admission medications   Medication Sig Start Date End Date Taking? Authorizing Provider  albuterol (PROVENTIL HFA;VENTOLIN HFA) 108 (90 BASE) MCG/ACT inhaler Inhale 2 puffs into the lungs every 6 (six) hours as needed for wheezing.   Yes Historical Provider, MD  ALPRAZolam Prudy Feeler) 0.5 MG tablet Take 0.5 mg by mouth 3 (three) times daily as needed. Anxiety   Yes Historical Provider, MD  budesonide-formoterol (SYMBICORT) 160-4.5 MCG/ACT inhaler Inhale 2 puffs into the lungs 2 (two) times daily.   Yes Historical Provider, MD  calcium carbonate (OS-CAL) 1250 MG chewable tablet Chew 1 tablet by mouth 2 (two) times daily.   Yes Historical Provider, MD  chlorpheniramine-HYDROcodone (TUSSIONEX) 10-8 MG/5ML LQCR Take 5 mLs by mouth every 12 (twelve) hours as needed. For cough   Yes Historical Provider, MD  cholecalciferol (VITAMIN D) 1000 UNITS tablet Take 1,000 Units by mouth 2 (two) times daily.    Yes Historical Provider, MD  diphenhydrAMINE (BENADRYL) 50 MG tablet Take 50 mg by mouth See admin instructions. Take 30 mins prior to allergy shot & after every Tuesday as needed for itching. Alternates with hydroxyzine.   Yes Historical Provider, MD   estradiol (ESTRACE) 0.5 MG tablet Take 1 mg by mouth at bedtime.    Yes Historical Provider, MD  HYDROmorphone (DILAUDID) 2 MG tablet Take 2 mg by mouth every 6 (six) hours as needed. Pain   Yes Historical Provider, MD  hydrOXYzine (ATARAX/VISTARIL) 25 MG tablet Take 25 mg by mouth See admin instructions. Takes every Tues 30 mins prior to allergy shot & after as needed for itching. Alternates with Benadryl. 02/19/12  Yes Historical Provider, MD  levofloxacin (LEVAQUIN) 500 MG tablet Take 500 mg by mouth daily. Take x7 days  Yes Historical Provider, MD  levothyroxine (SYNTHROID, LEVOTHROID) 50 MCG tablet Take 150 mcg by mouth daily before breakfast. BRAND NAME ONLY. CAN ONLY TAKE BECAUSE IT'S DYE FREE.   Yes Historical Provider, MD  Multiple Vitamin (MULITIVITAMIN WITH MINERALS) TABS Take 1 tablet by mouth daily.   Yes Historical Provider, MD  predniSONE (DELTASONE) 10 MG tablet Take 10 mg by mouth as directed. Tapered dosing as directed   Yes Historical Provider, MD  topiramate (TOPAMAX) 50 MG tablet Take 50 mg by mouth 2 (two) times daily.   Yes Historical Provider, MD  zoledronic acid (RECLAST) 5 MG/100ML SOLN Inject 5 mg into the vein once.   Yes Historical Provider, MD    Physical Exam: Filed Vitals:   09/21/12 1930 09/21/12 1945 09/21/12 2000 09/21/12 2015  BP: 126/80 119/80 108/59 113/67  Pulse: 90 81 73 71  Temp:      TempSrc:      Resp:  14 15 17   SpO2: 99% 93% 96% 97%    General: Alert, Awake and Oriented to Time, Place and Person. Appear in mild distress Eyes: PERRL ENT: Oral Mucosa clear moist. Neck: No JVD, no Carotid Bruits,  Cardiovascular: S1 and S2 Present, no Murmur, Peripheral Pulses Present Respiratory: Bilateral Air entry equal and Decreased, Clear to Auscultation,  Abdomen: Bowel Sound Present, Soft and minimal tender in epigastric region, Skin: No Rash Extremities: No Pedal edema, no calf tenderness Neurologic: Grossly Unremarkable.  Labs on Admission:   Basic Metabolic Panel:  Recent Labs Lab 09/21/12 1857  NA 142  K 3.6  CL 113*  GLUCOSE 114*  BUN 18  CREATININE 0.70   Liver Function Tests: No results found for this basename: AST, ALT, ALKPHOS, BILITOT, PROT, ALBUMIN,  in the last 168 hours No results found for this basename: LIPASE, AMYLASE,  in the last 168 hours No results found for this basename: AMMONIA,  in the last 168 hours CBC:  Recent Labs Lab 09/21/12 1833 09/21/12 1857  WBC 11.4*  --   HGB 14.0 15.0  HCT 42.3 44.0  MCV 90.4  --   PLT 305  --    Cardiac Enzymes: No results found for this basename: CKTOTAL, CKMB, CKMBINDEX, TROPONINI,  in the last 168 hours  BNP (last 3 results) No results found for this basename: PROBNP,  in the last 8760 hours CBG: No results found for this basename: GLUCAP,  in the last 168 hours  Radiological Exams on Admission: Dg Chest 2 View  09/21/2012   *RADIOLOGY REPORT*  Clinical Data: Shortness of breath  CHEST - 2 VIEW  Comparison: September 19, 2012  Findings: There is no focal infiltrate, pulmonary edema, or pleural effusion.  The mediastinal contour and cardiac silhouette are normal.  Postsurgical changes of the lower cervical spine are unchanged.  The patient is status post prior cholecystectomy with surgical clips in the right upper quadrant.  The soft tissues and osseous structures are stable.  IMPRESSION: No acute cardiopulmonary disease identified.   Original Report Authenticated By: Sherian Rein, M.D.    EKG: Independently reviewed. 1 mm ST depression in V3 only.  No other significant ST-T wave changes suggestive of ischemia  Assessment/Plan Principal Problem:   Chest pain Active Problems:   Leucocytosis   COPD (chronic obstructive pulmonary disease)   Hypothyroidism   1. Chest pain Patient had substernal heaviness along with single lead ST depression in precordial lead. The symptoms were associated with exertion and improved with rest. First set troponin is  negative. we we'll obtain 2 more sets of troponin. And admitted the patient under observation considering the significance of the EKG changes.  2. recent pneumonia Chest x-ray appears clear at present.  Patient has already completed 3 days of treatment with antibiotics and steroids. Considering that she had a significant cough, I will continue levofloxacin 500 mg daily to complete at least 5 days of treatment. And then stop. It does not appear that the patient requires prednisone treatment. Since she has taken only 3 days of treatment we will stop it.  3. hypothyroidism continue home dose of Synthroid  4. history of esophageal dysmotility disorder The records are not available in our system. Patient does followup with her GI doctor Dr. Madilyn Fireman. She has not been taking any PPI, I would start her on Protonix to see if that improves her chest pain. Since she had mild epigastric tenderness on the examination.  DVT Prophylaxis: subcutaneous Lovenox  Nutrition: Cardiac diet  Code Status: Full  Family Communication: Mother was at bedside to time and explained the plan.   Author: Lynden Oxford, MD [facility] Pager: [phone] 09/21/2012 9:35 PM    If 7PM-7AM, please contact night-coverage [email or web address] Password [email or web address]

## 2012-09-21 NOTE — ED Notes (Signed)
Patient transported to X-ray 

## 2012-09-21 NOTE — ED Notes (Signed)
Admitting dr. At bedside. 

## 2012-09-22 DIAGNOSIS — R079 Chest pain, unspecified: Secondary | ICD-10-CM

## 2012-09-22 DIAGNOSIS — J449 Chronic obstructive pulmonary disease, unspecified: Secondary | ICD-10-CM

## 2012-09-22 DIAGNOSIS — K224 Dyskinesia of esophagus: Secondary | ICD-10-CM

## 2012-09-22 DIAGNOSIS — K589 Irritable bowel syndrome without diarrhea: Secondary | ICD-10-CM

## 2012-09-22 DIAGNOSIS — E039 Hypothyroidism, unspecified: Secondary | ICD-10-CM

## 2012-09-22 DIAGNOSIS — R072 Precordial pain: Secondary | ICD-10-CM | POA: Diagnosis not present

## 2012-09-22 HISTORY — PX: TRANSTHORACIC ECHOCARDIOGRAM: SHX275

## 2012-09-22 LAB — CBC
Hemoglobin: 12.6 g/dL (ref 12.0–15.0)
MCH: 30.5 pg (ref 26.0–34.0)
MCV: 89.6 fL (ref 78.0–100.0)
Platelets: 293 10*3/uL (ref 150–400)
RBC: 4.13 MIL/uL (ref 3.87–5.11)
WBC: 11.5 10*3/uL — ABNORMAL HIGH (ref 4.0–10.5)

## 2012-09-22 LAB — BASIC METABOLIC PANEL
CO2: 20 mEq/L (ref 19–32)
Chloride: 110 mEq/L (ref 96–112)
Creatinine, Ser: 0.65 mg/dL (ref 0.50–1.10)
Glucose, Bld: 108 mg/dL — ABNORMAL HIGH (ref 70–99)
Sodium: 141 mEq/L (ref 135–145)

## 2012-09-22 MED ORDER — HYDROXYZINE HCL 25 MG PO TABS
25.0000 mg | ORAL_TABLET | Freq: Once | ORAL | Status: AC
  Administered 2012-09-22: 25 mg via ORAL
  Filled 2012-09-22 (×2): qty 1

## 2012-09-22 MED ORDER — LEVOTHYROXINE SODIUM 50 MCG PO TABS
150.0000 ug | ORAL_TABLET | Freq: Every day | ORAL | Status: DC
  Filled 2012-09-22: qty 3

## 2012-09-22 MED ORDER — HYDROXYZINE HCL 10 MG PO TABS
15.0000 mg | ORAL_TABLET | Freq: Three times a day (TID) | ORAL | Status: DC | PRN
  Administered 2012-09-22: 15 mg via ORAL
  Filled 2012-09-22 (×2): qty 2

## 2012-09-22 MED ORDER — HYDROXYZINE HCL 25 MG PO TABS: 50.0000 mg | ORAL_TABLET | Freq: Every day | ORAL | Status: DC | PRN

## 2012-09-22 MED ORDER — TOPIRAMATE 25 MG PO TABS
25.0000 mg | ORAL_TABLET | Freq: Two times a day (BID) | ORAL | Status: DC
  Filled 2012-09-22: qty 1

## 2012-09-22 NOTE — Discharge Summary (Signed)
Physician Discharge Summary  Patient ID: Kari Green MRN: [id number] DOB/AGE: Feb 06, 1966 47 y.o.  Admit date: 09/21/2012 Discharge date: 09/22/2012  Primary Care Physician:  Thayer Headings, MD  Discharge Diagnoses:    . Chest pain- atypical resolved  . Leucocytosis- likely secondary to steroids and a resolving pneumonia  . COPD (chronic obstructive pulmonary disease) . Hypothyroidism  Consults:  None   Recommendations for Outpatient Follow-up:  Patient was recommended the hospital followup in 7-10 days, she is doing fairly well. Follow TSH outpatient  Allergies:   Allergies  Allergen Reactions  . Cortisone Hives and Swelling  . Latex Hives and Swelling  . Morphine And Related Itching    States all pain meds cause itching/ STATES HAS TO PREMED WITH BENADRYL  . Peanuts (Peanut Oil) Itching  . Prednisone Other (See Comments)    Needs to be dye free: Yellow 5 Red 4  . Red Dye Hives  . Tape Other (See Comments)    Blisters skin-USE PAPER TAPE  And OP SITE OK Pt states all adhesives  . Yellow Dyes (Non-Tartrazine) Swelling    Hives   . Other Rash    dermabond-severe burn     Discharge Medications:   Medication List         albuterol 108 (90 BASE) MCG/ACT inhaler  Commonly known as:  PROVENTIL HFA;VENTOLIN HFA  Inhale 2 puffs into the lungs every 6 (six) hours as needed for wheezing.     ALPRAZolam 0.5 MG tablet  Commonly known as:  XANAX  Take 0.5 mg by mouth 3 (three) times daily as needed. Anxiety     budesonide-formoterol 160-4.5 MCG/ACT inhaler  Commonly known as:  SYMBICORT  Inhale 2 puffs into the lungs 2 (two) times daily.     calcium carbonate 1250 MG chewable tablet  Commonly known as:  OS-CAL  Chew 1 tablet by mouth 2 (two) times daily.     chlorpheniramine-HYDROcodone 10-8 MG/5ML Lqcr  Commonly known as:  TUSSIONEX  Take 5 mLs by mouth every 12 (twelve) hours as needed. For cough     cholecalciferol 1000 UNITS tablet  Commonly known as:   VITAMIN D  Take 1,000 Units by mouth 2 (two) times daily.     diphenhydrAMINE 50 MG tablet  Commonly known as:  BENADRYL  Take 50 mg by mouth See admin instructions. Take 30 mins prior to allergy shot & after every Tuesday as needed for itching. Alternates with hydroxyzine.     estradiol 0.5 MG tablet  Commonly known as:  ESTRACE  Take 1 mg by mouth at bedtime.     HYDROmorphone 2 MG tablet  Commonly known as:  DILAUDID  Take 2 mg by mouth every 6 (six) hours as needed. Pain     hydrOXYzine 25 MG tablet  Commonly known as:  ATARAX/VISTARIL  Take 50 mg by mouth daily as needed for itching (Take 25 mg by mouth See admin instructions. Takes every Tues 30 mins prior to allergy shot & after as needed for itching. Alternates with Benadryl).     levofloxacin 500 MG tablet  Commonly known as:  LEVAQUIN  Take 500 mg by mouth daily. Take x7 days     levothyroxine 50 MCG tablet  Commonly known as:  SYNTHROID, LEVOTHROID  Take 150 mcg by mouth daily before breakfast. BRAND NAME ONLY. CAN ONLY TAKE BECAUSE IT'S DYE FREE.     multivitamin with minerals Tabs  Take 1 tablet by mouth daily.     predniSONE 10  MG tablet  Commonly known as:  DELTASONE  Take 10 mg by mouth as directed. Tapered dosing as directed     RECLAST 5 MG/100ML Soln  Generic drug:  zoledronic acid  Inject 5 mg into the vein once.     topiramate 25 MG tablet  Commonly known as:  TOPAMAX  Take 25 mg by mouth 2 (two) times daily.         Brief H and P: For complete details please refer to admission H and P, but in brief Kari Green is a 47 y.o. female with Past medical history of dyslipidemia, hypothyroidism, significant allergy to dye, migraine. She presented today to her PCPs office with the complaint of chest pain that was substernal in location moderate in severity nonradiating, associated with exertion and shortness of breath and diaphoresis, She denied any similar pain in the past.  She had recently  been complaining of cough and shortness of breath for which she has been seen by her PCP who has performed an x-ray, the patient was informed that she has pneumonia and was started on levofloxacin 500 mg daily and prednisone taper along with Symbicort and albuterol inhalers. Patient has completed 3 days of treatment with both.   Hospital Course:     Chest pain-atypical. Patient was ruled out for acute ACS, 3 sets of cardiac enzymes were negative. D-dimer also normal chest x-ray showed no acute cardiopulmonary disease. As patient had reported family history of heart disease in her mother and grandmother, 2-D echo was performed which showed EF of 55-60%, normal wall motion, no regional wall motion abnormalities, no valvular abnormalities. Patient was recommended proton pump inhibitor while she's taking prednisone, possibly due to GERD. She reported that she has had extensive GI workup in the past for acid reflux and a recent EGD last year. She has PPI at home and she will take it.   Resolving pneumonia/acute bronchitis: Patient symptom has significantly improved, she was advised to complete the course of her treatment as recommended by her primary care physician.    Leucocytosis-likely secondary to steroids, patient remained afebrile and she is currently on Levaquin.     COPD (chronic obstructive pulmonary disease): Patient was recommended to continue prednisone, albuterol and Symbicort as prescribed by her PCP    Hypothyroidism- continue Synthroid   Day of Discharge BP 110/70  Pulse 72  Temp(Src) 98.3 F (36.8 C) (Oral)  Resp 16  Ht 5\' 3"  (1.6 m)  Wt 63.504 kg (140 lb)  BMI 24.81 kg/m2  SpO2 99%  Physical Exam: General: Alert and awake oriented x3 not in any acute distress. HEENT: anicteric sclera, pupils reactive to light and accommodation CVS: S1-S2 clear no murmur rubs or gallops Chest: clear to auscultation bilaterally, no wheezing rales or rhonchi Abdomen: soft nontender,  nondistended, normal bowel sounds, no organomegaly Extremities: no cyanosis, clubbing or edema noted bilaterally Neuro: Cranial nerves II-XII intact, no focal neurological deficits   The results of significant diagnostics from this hospitalization (including imaging, microbiology, ancillary and laboratory) are listed below for reference.    LAB RESULTS: Basic Metabolic Panel:  Recent Labs Lab 09/21/12 1857 09/22/12 0525  NA 142 141  K 3.6 3.5  CL 113* 110  CO2  --  20  GLUCOSE 114* 108*  BUN 18 19  CREATININE 0.70 0.65  CALCIUM  --  8.6   Liver Function Tests: No results found for this basename: AST, ALT, ALKPHOS, BILITOT, PROT, ALBUMIN,  in the last 168 hours No  results found for this basename: LIPASE, AMYLASE,  in the last 168 hours No results found for this basename: AMMONIA,  in the last 168 hours CBC:  Recent Labs Lab 09/21/12 1833 09/21/12 1857 09/22/12 0525  WBC 11.4*  --  11.5*  HGB 14.0 15.0 12.6  HCT 42.3 44.0 37.0  MCV 90.4  --  89.6  PLT 305  --  293   Cardiac Enzymes:  Recent Labs Lab 09/22/12 0017 09/22/12 0525  TROPONINI <0.30 <0.30   BNP: No components found with this basename: POCBNP,  CBG: No results found for this basename: GLUCAP,  in the last 168 hours  Significant Diagnostic Studies:  Dg Chest 2 View  09/21/2012   *RADIOLOGY REPORT*  Clinical Data: Shortness of breath  CHEST - 2 VIEW  Comparison: September 19, 2012  Findings: There is no focal infiltrate, pulmonary edema, or pleural effusion.  The mediastinal contour and cardiac silhouette are normal.  Postsurgical changes of the lower cervical spine are unchanged.  The patient is status post prior cholecystectomy with surgical clips in the right upper quadrant.  The soft tissues and osseous structures are stable.  IMPRESSION: No acute cardiopulmonary disease identified.   Original Report Authenticated By: Sherian Rein, M.D.    2D ECHO: Study Conclusions  - Left ventricle: The cavity size  was normal. Wall thickness was normal. Systolic function was normal. The estimated ejection fraction was in the range of 55% to 60%. Wall motion was normal; there were no regional wall motion abnormalities. Left ventricular diastolic function parameters were normal. - Atrial septum: No defect or patent foramen ovale was identified. - Pericardium, extracardiac: A trivial pericardial effusion was identified posterior to the heart.    Disposition and Follow-up:     Discharge Orders   Future Orders Complete By Expires     Diet - low sodium heart healthy  As directed     Increase activity slowly  As directed         DISPOSITION: Home   DIET: Heart healthy diet   ACTIVITY: as tolerated   DISCHARGE FOLLOW-UP Follow-up Information   Follow up with Thayer Headings, MD. Schedule an appointment as soon as possible for a visit in 10 days. (for Hospital followup)    Contact information:   [address] [place] [address] [phone]       Time spent on Discharge: 35 minutes   Signed:   Caton Popowski M.D. [facility] 09/22/2012, 11:45 AM Pager: [phone]

## 2012-09-22 NOTE — Progress Notes (Signed)
  Echocardiogram 2D Echocardiogram has been performed.  Fareed Fung 09/22/2012, 9:11 AM

## 2012-09-22 NOTE — ED Provider Notes (Signed)
I have personally seen and examined the patient.  I have discussed the plan of care with the resident.  I have reviewed the documentation on PMH/FH/Soc. History.  I have reviewed the documentation of the resident and agree.  Doug Sou, MD 09/22/12 0002

## 2012-11-29 DIAGNOSIS — Z01818 Encounter for other preprocedural examination: Secondary | ICD-10-CM | POA: Diagnosis not present

## 2012-12-02 ENCOUNTER — Emergency Department (HOSPITAL_BASED_OUTPATIENT_CLINIC_OR_DEPARTMENT_OTHER)
Admission: EM | Admit: 2012-12-02 | Discharge: 2012-12-02 | Disposition: A | Discharge status: Home / Self Care | Payer: Managed Care, Other (non HMO) | Attending: Emergency Medicine | Admitting: Emergency Medicine

## 2012-12-02 ENCOUNTER — Encounter (HOSPITAL_BASED_OUTPATIENT_CLINIC_OR_DEPARTMENT_OTHER): Payer: Self-pay | Admitting: Emergency Medicine

## 2012-12-02 DIAGNOSIS — Z85828 Personal history of other malignant neoplasm of skin: Secondary | ICD-10-CM | POA: Insufficient documentation

## 2012-12-02 DIAGNOSIS — F411 Generalized anxiety disorder: Secondary | ICD-10-CM | POA: Insufficient documentation

## 2012-12-02 DIAGNOSIS — Z85820 Personal history of malignant melanoma of skin: Secondary | ICD-10-CM | POA: Insufficient documentation

## 2012-12-02 DIAGNOSIS — Z87891 Personal history of nicotine dependence: Secondary | ICD-10-CM | POA: Insufficient documentation

## 2012-12-02 DIAGNOSIS — G43909 Migraine, unspecified, not intractable, without status migrainosus: Secondary | ICD-10-CM | POA: Insufficient documentation

## 2012-12-02 DIAGNOSIS — Z8719 Personal history of other diseases of the digestive system: Secondary | ICD-10-CM | POA: Insufficient documentation

## 2012-12-02 DIAGNOSIS — E039 Hypothyroidism, unspecified: Secondary | ICD-10-CM | POA: Insufficient documentation

## 2012-12-02 DIAGNOSIS — Z91040 Latex allergy status: Secondary | ICD-10-CM | POA: Insufficient documentation

## 2012-12-02 DIAGNOSIS — L299 Pruritus, unspecified: Secondary | ICD-10-CM | POA: Insufficient documentation

## 2012-12-02 DIAGNOSIS — T7840XA Allergy, unspecified, initial encounter: Secondary | ICD-10-CM | POA: Diagnosis not present

## 2012-12-02 DIAGNOSIS — Z8701 Personal history of pneumonia (recurrent): Secondary | ICD-10-CM | POA: Insufficient documentation

## 2012-12-02 DIAGNOSIS — Z87442 Personal history of urinary calculi: Secondary | ICD-10-CM | POA: Insufficient documentation

## 2012-12-02 DIAGNOSIS — Z8739 Personal history of other diseases of the musculoskeletal system and connective tissue: Secondary | ICD-10-CM | POA: Insufficient documentation

## 2012-12-02 DIAGNOSIS — R609 Edema, unspecified: Secondary | ICD-10-CM | POA: Insufficient documentation

## 2012-12-02 DIAGNOSIS — Z79899 Other long term (current) drug therapy: Secondary | ICD-10-CM | POA: Insufficient documentation

## 2012-12-02 DIAGNOSIS — M810 Age-related osteoporosis without current pathological fracture: Secondary | ICD-10-CM | POA: Insufficient documentation

## 2012-12-02 DIAGNOSIS — IMO0001 Reserved for inherently not codable concepts without codable children: Secondary | ICD-10-CM | POA: Insufficient documentation

## 2012-12-02 DIAGNOSIS — H6690 Otitis media, unspecified, unspecified ear: Secondary | ICD-10-CM | POA: Insufficient documentation

## 2012-12-02 MED ORDER — DIPHENHYDRAMINE HCL 50 MG/ML IJ SOLN
50.0000 mg | Freq: Once | INTRAMUSCULAR | Status: AC
  Administered 2012-12-02: 50 mg via INTRAMUSCULAR
  Filled 2012-12-02: qty 1

## 2012-12-02 MED ORDER — METHYLPREDNISOLONE SODIUM SUCC 125 MG IJ SOLR
INTRAMUSCULAR | Status: AC
  Filled 2012-12-02: qty 2

## 2012-12-02 MED ORDER — METHYLPREDNISOLONE ACETATE 40 MG/ML IJ SUSP
80.0000 mg | Freq: Once | INTRAMUSCULAR | Status: DC
  Filled 2012-12-02: qty 2

## 2012-12-02 MED ORDER — METHYLPREDNISOLONE SODIUM SUCC 125 MG IJ SOLR
125.0000 mg | Freq: Once | INTRAMUSCULAR | Status: AC
  Administered 2012-12-02: 125 mg via INTRAMUSCULAR

## 2012-12-02 NOTE — ED Notes (Signed)
Patient has been itching for about a week.  Patient states that she is allergic to many things.  Patient is not sure if she was bit or it is an actual reacting to something.  Respiratory intact.
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
## 2012-12-03 ENCOUNTER — Emergency Department (HOSPITAL_BASED_OUTPATIENT_CLINIC_OR_DEPARTMENT_OTHER)
Admission: EM | Admit: 2012-12-03 | Discharge: 2012-12-03 | Disposition: A | Discharge status: Home / Self Care | Payer: Managed Care, Other (non HMO) | Attending: Emergency Medicine | Admitting: Emergency Medicine

## 2012-12-03 ENCOUNTER — Emergency Department (HOSPITAL_BASED_OUTPATIENT_CLINIC_OR_DEPARTMENT_OTHER): Payer: Managed Care, Other (non HMO)

## 2012-12-03 ENCOUNTER — Encounter (HOSPITAL_BASED_OUTPATIENT_CLINIC_OR_DEPARTMENT_OTHER): Payer: Self-pay | Admitting: Emergency Medicine

## 2012-12-03 DIAGNOSIS — S0120XA Unspecified open wound of nose, initial encounter: Secondary | ICD-10-CM | POA: Diagnosis not present

## 2012-12-03 DIAGNOSIS — T148XXA Other injury of unspecified body region, initial encounter: Secondary | ICD-10-CM

## 2012-12-03 DIAGNOSIS — Z87442 Personal history of urinary calculi: Secondary | ICD-10-CM | POA: Diagnosis not present

## 2012-12-03 DIAGNOSIS — M810 Age-related osteoporosis without current pathological fracture: Secondary | ICD-10-CM | POA: Insufficient documentation

## 2012-12-03 DIAGNOSIS — Z862 Personal history of diseases of the blood and blood-forming organs and certain disorders involving the immune mechanism: Secondary | ICD-10-CM | POA: Insufficient documentation

## 2012-12-03 DIAGNOSIS — Z8719 Personal history of other diseases of the digestive system: Secondary | ICD-10-CM | POA: Insufficient documentation

## 2012-12-03 DIAGNOSIS — Z79899 Other long term (current) drug therapy: Secondary | ICD-10-CM | POA: Insufficient documentation

## 2012-12-03 DIAGNOSIS — G8929 Other chronic pain: Secondary | ICD-10-CM | POA: Diagnosis not present

## 2012-12-03 DIAGNOSIS — Z87891 Personal history of nicotine dependence: Secondary | ICD-10-CM | POA: Diagnosis not present

## 2012-12-03 DIAGNOSIS — S0003XA Contusion of scalp, initial encounter: Secondary | ICD-10-CM | POA: Insufficient documentation

## 2012-12-03 DIAGNOSIS — Z8701 Personal history of pneumonia (recurrent): Secondary | ICD-10-CM | POA: Insufficient documentation

## 2012-12-03 DIAGNOSIS — Z23 Encounter for immunization: Secondary | ICD-10-CM | POA: Diagnosis not present

## 2012-12-03 DIAGNOSIS — M542 Cervicalgia: Secondary | ICD-10-CM | POA: Diagnosis not present

## 2012-12-03 DIAGNOSIS — Z85820 Personal history of malignant melanoma of skin: Secondary | ICD-10-CM | POA: Insufficient documentation

## 2012-12-03 DIAGNOSIS — Z87448 Personal history of other diseases of urinary system: Secondary | ICD-10-CM | POA: Diagnosis not present

## 2012-12-03 DIAGNOSIS — IMO0002 Reserved for concepts with insufficient information to code with codable children: Secondary | ICD-10-CM | POA: Diagnosis not present

## 2012-12-03 DIAGNOSIS — Y9389 Activity, other specified: Secondary | ICD-10-CM | POA: Diagnosis not present

## 2012-12-03 DIAGNOSIS — S0993XA Unspecified injury of face, initial encounter: Secondary | ICD-10-CM | POA: Diagnosis not present

## 2012-12-03 DIAGNOSIS — F411 Generalized anxiety disorder: Secondary | ICD-10-CM | POA: Insufficient documentation

## 2012-12-03 DIAGNOSIS — S00209A Unspecified superficial injury of unspecified eyelid and periocular area, initial encounter: Secondary | ICD-10-CM | POA: Diagnosis not present

## 2012-12-03 DIAGNOSIS — S0990XA Unspecified injury of head, initial encounter: Secondary | ICD-10-CM | POA: Insufficient documentation

## 2012-12-03 DIAGNOSIS — E039 Hypothyroidism, unspecified: Secondary | ICD-10-CM | POA: Insufficient documentation

## 2012-12-03 DIAGNOSIS — R58 Hemorrhage, not elsewhere classified: Secondary | ICD-10-CM

## 2012-12-03 DIAGNOSIS — Z85828 Personal history of other malignant neoplasm of skin: Secondary | ICD-10-CM | POA: Diagnosis not present

## 2012-12-03 DIAGNOSIS — G43909 Migraine, unspecified, not intractable, without status migrainosus: Secondary | ICD-10-CM | POA: Insufficient documentation

## 2012-12-03 DIAGNOSIS — T7840XA Allergy, unspecified, initial encounter: Secondary | ICD-10-CM | POA: Diagnosis not present

## 2012-12-03 DIAGNOSIS — Y9289 Other specified places as the place of occurrence of the external cause: Secondary | ICD-10-CM | POA: Diagnosis not present

## 2012-12-03 DIAGNOSIS — Z91040 Latex allergy status: Secondary | ICD-10-CM | POA: Insufficient documentation

## 2012-12-03 DIAGNOSIS — Z8739 Personal history of other diseases of the musculoskeletal system and connective tissue: Secondary | ICD-10-CM | POA: Insufficient documentation

## 2012-12-03 DIAGNOSIS — S058X9A Other injuries of unspecified eye and orbit, initial encounter: Secondary | ICD-10-CM | POA: Diagnosis not present

## 2012-12-03 MED ORDER — ONDANSETRON 8 MG PO TBDP
ORAL_TABLET | ORAL | Status: AC
  Filled 2012-12-03: qty 1

## 2012-12-03 MED ORDER — TETANUS-DIPHTH-ACELL PERTUSSIS 5-2.5-18.5 LF-MCG/0.5 IM SUSP
0.5000 mL | Freq: Once | INTRAMUSCULAR | Status: AC
  Administered 2012-12-03: 0.5 mL via INTRAMUSCULAR
  Filled 2012-12-03: qty 0.5

## 2012-12-03 MED ORDER — ONDANSETRON 8 MG PO TBDP
8.0000 mg | ORAL_TABLET | Freq: Once | ORAL | Status: AC
  Administered 2012-12-03: 8 mg via ORAL

## 2012-12-03 MED ORDER — DIPHENHYDRAMINE HCL 50 MG/ML IJ SOLN: 12.5000 mg | Freq: Once | INTRAMUSCULAR | Status: AC

## 2012-12-03 MED ORDER — TRAMADOL HCL 50 MG PO TABS
50.0000 mg | ORAL_TABLET | Freq: Once | ORAL | Status: AC
  Administered 2012-12-03: 50 mg via ORAL
  Filled 2012-12-03: qty 1

## 2012-12-03 MED ORDER — DIPHENHYDRAMINE HCL 12.5 MG/5ML PO ELIX: 12.5000 mg | ORAL_SOLUTION | Freq: Once | ORAL | Status: DC

## 2012-12-03 MED ORDER — TRAMADOL HCL 50 MG PO TABS: 50.0000 mg | ORAL_TABLET | Freq: Four times a day (QID) | ORAL | Status: DC | PRN

## 2012-12-03 MED ORDER — DIPHENHYDRAMINE HCL 50 MG/ML IJ SOLN
INTRAMUSCULAR | Status: AC
  Administered 2012-12-03: 12.5 mg via INTRAMUSCULAR
  Filled 2012-12-03: qty 1
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
## 2012-12-03 NOTE — ED Notes (Signed)
MD at bedside. 

## 2012-12-03 NOTE — ED Notes (Signed)
Pt reports that she was in the attic carrying a crystal bowl when she fell and the bowl landed on her face.  No loc but reports that she was disoriented immediately afterward.  Has swelling and abrasion to right perioribital space.  Swelling and ecchymosis to nose.  Denies difficulty breathing.

## 2012-12-03 NOTE — ED Notes (Signed)
Pt requesting nausea medicine.  Reporting that her insurance "does not cover pills, it covers injections only." MD also at bedside.  Discussed with pt that her insurance should cover pills as well as injections.  Zofran offered for nausea if wanted.

## 2012-12-03 NOTE — ED Notes (Signed)
Patient transported to CT 

## 2012-12-03 NOTE — ED Notes (Signed)
All pills given dye free this visit.

## 2012-12-08 DIAGNOSIS — T8489XA Other specified complication of internal orthopedic prosthetic devices, implants and grafts, initial encounter: Secondary | ICD-10-CM | POA: Diagnosis not present

## 2012-12-08 DIAGNOSIS — M12269 Villonodular synovitis (pigmented), unspecified knee: Secondary | ICD-10-CM | POA: Diagnosis not present

## 2012-12-08 DIAGNOSIS — Z96659 Presence of unspecified artificial knee joint: Secondary | ICD-10-CM | POA: Diagnosis not present

## 2012-12-08 DIAGNOSIS — T84093A Other mechanical complication of internal left knee prosthesis, initial encounter: Secondary | ICD-10-CM | POA: Insufficient documentation

## 2012-12-08 DIAGNOSIS — Z471 Aftercare following joint replacement surgery: Secondary | ICD-10-CM | POA: Diagnosis not present

## 2012-12-10 DIAGNOSIS — M7989 Other specified soft tissue disorders: Secondary | ICD-10-CM | POA: Diagnosis not present

## 2012-12-10 DIAGNOSIS — M25469 Effusion, unspecified knee: Secondary | ICD-10-CM | POA: Diagnosis not present

## 2012-12-20 ENCOUNTER — Encounter (HOSPITAL_COMMUNITY): Payer: Managed Care, Other (non HMO)

## 2012-12-26 DIAGNOSIS — R609 Edema, unspecified: Secondary | ICD-10-CM | POA: Diagnosis not present

## 2013-02-14 DIAGNOSIS — G8929 Other chronic pain: Secondary | ICD-10-CM | POA: Diagnosis not present

## 2013-02-14 DIAGNOSIS — IMO0001 Reserved for inherently not codable concepts without codable children: Secondary | ICD-10-CM | POA: Diagnosis not present

## 2013-02-17 DIAGNOSIS — IMO0001 Reserved for inherently not codable concepts without codable children: Secondary | ICD-10-CM | POA: Diagnosis not present

## 2013-02-17 DIAGNOSIS — G8929 Other chronic pain: Secondary | ICD-10-CM | POA: Diagnosis not present

## 2013-02-20 ENCOUNTER — Other Ambulatory Visit (HOSPITAL_COMMUNITY): Payer: Self-pay | Admitting: Orthopedic Surgery

## 2013-02-20 ENCOUNTER — Other Ambulatory Visit (HOSPITAL_COMMUNITY): Payer: Self-pay | Admitting: *Deleted

## 2013-02-20 DIAGNOSIS — G8929 Other chronic pain: Secondary | ICD-10-CM | POA: Diagnosis not present

## 2013-02-20 DIAGNOSIS — M25512 Pain in left shoulder: Secondary | ICD-10-CM

## 2013-02-20 DIAGNOSIS — IMO0001 Reserved for inherently not codable concepts without codable children: Secondary | ICD-10-CM | POA: Diagnosis not present

## 2013-02-23 DIAGNOSIS — A088 Other specified intestinal infections: Secondary | ICD-10-CM | POA: Diagnosis not present

## 2013-02-23 DIAGNOSIS — R197 Diarrhea, unspecified: Secondary | ICD-10-CM | POA: Diagnosis not present

## 2013-02-23 DIAGNOSIS — R112 Nausea with vomiting, unspecified: Secondary | ICD-10-CM | POA: Diagnosis not present

## 2013-02-26 DIAGNOSIS — R262 Difficulty in walking, not elsewhere classified: Secondary | ICD-10-CM | POA: Diagnosis not present

## 2013-02-26 DIAGNOSIS — M25569 Pain in unspecified knee: Secondary | ICD-10-CM | POA: Diagnosis not present

## 2013-02-27 DIAGNOSIS — J309 Allergic rhinitis, unspecified: Secondary | ICD-10-CM | POA: Diagnosis not present

## 2013-03-02 ENCOUNTER — Ambulatory Visit (HOSPITAL_COMMUNITY)
Admission: RE | Admit: 2013-03-02 | Discharge: 2013-03-02 | Disposition: A | Discharge status: Home / Self Care | Payer: Managed Care, Other (non HMO) | Source: Ambulatory Visit | Attending: Orthopedic Surgery | Admitting: Orthopedic Surgery

## 2013-03-02 DIAGNOSIS — M25512 Pain in left shoulder: Secondary | ICD-10-CM

## 2013-03-02 DIAGNOSIS — Z96659 Presence of unspecified artificial knee joint: Secondary | ICD-10-CM | POA: Insufficient documentation

## 2013-03-02 DIAGNOSIS — S42133A Displaced fracture of coracoid process, unspecified shoulder, initial encounter for closed fracture: Secondary | ICD-10-CM | POA: Diagnosis not present

## 2013-03-02 DIAGNOSIS — R209 Unspecified disturbances of skin sensation: Secondary | ICD-10-CM | POA: Insufficient documentation

## 2013-03-02 DIAGNOSIS — X58XXXA Exposure to other specified factors, initial encounter: Secondary | ICD-10-CM | POA: Insufficient documentation

## 2013-03-02 DIAGNOSIS — M25519 Pain in unspecified shoulder: Secondary | ICD-10-CM | POA: Insufficient documentation

## 2013-03-02 MED ORDER — IOHEXOL 300 MG/ML  SOLN
50.0000 mL | Freq: Once | INTRAMUSCULAR | Status: AC | PRN
  Administered 2013-03-02: 15 mL

## 2013-03-03 DIAGNOSIS — G8929 Other chronic pain: Secondary | ICD-10-CM | POA: Diagnosis not present

## 2013-03-03 DIAGNOSIS — IMO0001 Reserved for inherently not codable concepts without codable children: Secondary | ICD-10-CM | POA: Diagnosis not present

## 2013-03-05 DIAGNOSIS — S43016A Anterior dislocation of unspecified humerus, initial encounter: Secondary | ICD-10-CM | POA: Diagnosis not present

## 2013-03-07 DIAGNOSIS — M25519 Pain in unspecified shoulder: Secondary | ICD-10-CM | POA: Diagnosis not present

## 2013-03-07 DIAGNOSIS — J309 Allergic rhinitis, unspecified: Secondary | ICD-10-CM | POA: Diagnosis not present

## 2013-03-07 DIAGNOSIS — E538 Deficiency of other specified B group vitamins: Secondary | ICD-10-CM | POA: Diagnosis not present

## 2013-03-09 ENCOUNTER — Ambulatory Visit (HOSPITAL_COMMUNITY)
Admission: RE | Admit: 2013-03-09 | Discharge: 2013-03-09 | Disposition: A | Discharge status: Home / Self Care | Payer: Managed Care, Other (non HMO) | Source: Ambulatory Visit | Attending: Orthopedic Surgery | Admitting: Orthopedic Surgery

## 2013-03-09 ENCOUNTER — Other Ambulatory Visit (HOSPITAL_COMMUNITY): Payer: Self-pay | Admitting: Orthopedic Surgery

## 2013-03-09 DIAGNOSIS — S42133A Displaced fracture of coracoid process, unspecified shoulder, initial encounter for closed fracture: Secondary | ICD-10-CM | POA: Insufficient documentation

## 2013-03-09 DIAGNOSIS — M25519 Pain in unspecified shoulder: Secondary | ICD-10-CM

## 2013-03-09 DIAGNOSIS — X58XXXA Exposure to other specified factors, initial encounter: Secondary | ICD-10-CM | POA: Insufficient documentation

## 2013-03-13 DIAGNOSIS — IMO0001 Reserved for inherently not codable concepts without codable children: Secondary | ICD-10-CM | POA: Diagnosis not present

## 2013-03-13 DIAGNOSIS — G8929 Other chronic pain: Secondary | ICD-10-CM | POA: Diagnosis not present

## 2013-03-13 DIAGNOSIS — J309 Allergic rhinitis, unspecified: Secondary | ICD-10-CM | POA: Diagnosis not present

## 2013-03-14 DIAGNOSIS — M25519 Pain in unspecified shoulder: Secondary | ICD-10-CM | POA: Diagnosis not present

## 2013-03-15 ENCOUNTER — Encounter (INDEPENDENT_AMBULATORY_CARE_PROVIDER_SITE_OTHER): Payer: Self-pay

## 2013-03-15 ENCOUNTER — Ambulatory Visit (INDEPENDENT_AMBULATORY_CARE_PROVIDER_SITE_OTHER): Payer: Managed Care, Other (non HMO) | Admitting: Neurology

## 2013-03-15 ENCOUNTER — Ambulatory Visit (INDEPENDENT_AMBULATORY_CARE_PROVIDER_SITE_OTHER): Payer: Managed Care, Other (non HMO)

## 2013-03-15 DIAGNOSIS — Z0289 Encounter for other administrative examinations: Secondary | ICD-10-CM

## 2013-03-15 DIAGNOSIS — M79609 Pain in unspecified limb: Secondary | ICD-10-CM

## 2013-03-15 DIAGNOSIS — M25512 Pain in left shoulder: Secondary | ICD-10-CM

## 2013-03-16 NOTE — Procedures (Signed)
    [facility]  NCS (NERVE CONDUCTION STUDY) WITH EMG (ELECTROMYOGRAPHY) REPORT   STUDY DATE: Mar 16 2013 PATIENT NAME: Kari Green DOB: 25-Jan-1966 MRN: [id number]    TECHNOLOGIST: Naaman Plummer ELECTROMYOGRAPHER: Marcial Pacas M.D.  CLINICAL INFORMATION:  48 year old right-handed Caucasian female, referred by orthopedic surgeon Dr. Letta Moynahan for evaluation of bilateral shoulder weakness, she had a history of right total knee replacement in October 2014, fell, braced herself with her left arm, she felt left shoulder pain, MRI of left shoulder demonstrated a mildly displaced fracture of the left coracoid process, osteochondral fracture, or avascular excursion of the superior humeral head, intact rotator cuff, she was noticed to have possible bilateral shoulder winging, referred to  electrodiagnostic study for evaluation.  she complains of left hand achy pain, numbness when she raise her left arm.  On exam: I did not notice bilateral scapular winging, there is mild limitation of left shoulder movement, otherwise there was no significant bilateral upper extremity weakness, deep tendon reflexes were brisk and symmetric  FINDINGS: NERVE CONDUCTION STUDY: Bilateral median, ulnar sensory and motor responses were normal   NEEDLE ELECTROMYOGRAPHY: Selected needle examination was performed at left upper extremity muscles, and the left cervical paraspinal muscles   Needle examination of left supraspinatus, infraspinatus, deltoid, biceps, triceps, brachial radialis, extensor digitorum communis, pronator teres, flexor carpi ulnaris, serratus anterior was normal .  There was no spontaneous activity at left cervical paraspinal muscles, left C4, 5, 6  IMPRESSION:   This is a normal study. There is no electrodiagnostic evidence of left upper extremity neuropathy, or left cervical radiculopathy  INTERPRETING PHYSICIAN:   Marcial Pacas M.D. Ph.D. [facility] [address], [place], [place] [address] [phone]

## 2013-03-20 DIAGNOSIS — J309 Allergic rhinitis, unspecified: Secondary | ICD-10-CM | POA: Diagnosis not present

## 2013-03-21 DIAGNOSIS — E538 Deficiency of other specified B group vitamins: Secondary | ICD-10-CM | POA: Diagnosis not present

## 2013-03-21 DIAGNOSIS — G8929 Other chronic pain: Secondary | ICD-10-CM | POA: Diagnosis not present

## 2013-03-21 DIAGNOSIS — IMO0001 Reserved for inherently not codable concepts without codable children: Secondary | ICD-10-CM | POA: Diagnosis not present

## 2013-03-23 DIAGNOSIS — M25519 Pain in unspecified shoulder: Secondary | ICD-10-CM | POA: Diagnosis not present

## 2013-03-23 DIAGNOSIS — R059 Cough, unspecified: Secondary | ICD-10-CM | POA: Diagnosis not present

## 2013-03-23 DIAGNOSIS — R05 Cough: Secondary | ICD-10-CM | POA: Diagnosis not present

## 2013-03-23 DIAGNOSIS — J029 Acute pharyngitis, unspecified: Secondary | ICD-10-CM | POA: Diagnosis not present

## 2013-03-23 DIAGNOSIS — J019 Acute sinusitis, unspecified: Secondary | ICD-10-CM | POA: Diagnosis not present

## 2013-03-27 DIAGNOSIS — M959 Acquired deformity of musculoskeletal system, unspecified: Secondary | ICD-10-CM | POA: Diagnosis not present

## 2013-03-27 DIAGNOSIS — M8700 Idiopathic aseptic necrosis of unspecified bone: Secondary | ICD-10-CM | POA: Diagnosis not present

## 2013-03-27 DIAGNOSIS — M87029 Idiopathic aseptic necrosis of unspecified humerus: Secondary | ICD-10-CM | POA: Diagnosis not present

## 2013-03-27 DIAGNOSIS — M24819 Other specific joint derangements of unspecified shoulder, not elsewhere classified: Secondary | ICD-10-CM | POA: Diagnosis not present

## 2013-03-27 DIAGNOSIS — G8918 Other acute postprocedural pain: Secondary | ICD-10-CM | POA: Diagnosis not present

## 2013-04-02 DIAGNOSIS — E538 Deficiency of other specified B group vitamins: Secondary | ICD-10-CM | POA: Diagnosis not present

## 2013-04-02 DIAGNOSIS — J309 Allergic rhinitis, unspecified: Secondary | ICD-10-CM | POA: Diagnosis not present

## 2013-04-11 DIAGNOSIS — E538 Deficiency of other specified B group vitamins: Secondary | ICD-10-CM | POA: Diagnosis not present

## 2013-04-16 DIAGNOSIS — G8929 Other chronic pain: Secondary | ICD-10-CM | POA: Diagnosis not present

## 2013-04-16 DIAGNOSIS — IMO0001 Reserved for inherently not codable concepts without codable children: Secondary | ICD-10-CM | POA: Diagnosis not present

## 2013-04-25 DIAGNOSIS — J309 Allergic rhinitis, unspecified: Secondary | ICD-10-CM | POA: Diagnosis not present

## 2013-04-30 DIAGNOSIS — M25519 Pain in unspecified shoulder: Secondary | ICD-10-CM | POA: Diagnosis not present

## 2013-05-09 DIAGNOSIS — IMO0001 Reserved for inherently not codable concepts without codable children: Secondary | ICD-10-CM | POA: Diagnosis not present

## 2013-05-09 DIAGNOSIS — G8929 Other chronic pain: Secondary | ICD-10-CM | POA: Diagnosis not present

## 2013-05-14 ENCOUNTER — Telehealth: Payer: Self-pay | Admitting: Medical Oncology

## 2013-05-14 NOTE — Telephone Encounter (Signed)
erroneous

## 2013-05-30 DIAGNOSIS — G8929 Other chronic pain: Secondary | ICD-10-CM | POA: Diagnosis not present

## 2013-05-30 DIAGNOSIS — IMO0001 Reserved for inherently not codable concepts without codable children: Secondary | ICD-10-CM | POA: Diagnosis not present

## 2013-06-04 DIAGNOSIS — IMO0001 Reserved for inherently not codable concepts without codable children: Secondary | ICD-10-CM | POA: Diagnosis not present

## 2013-06-04 DIAGNOSIS — G8929 Other chronic pain: Secondary | ICD-10-CM | POA: Diagnosis not present

## 2013-06-06 DIAGNOSIS — G8929 Other chronic pain: Secondary | ICD-10-CM | POA: Diagnosis not present

## 2013-06-06 DIAGNOSIS — IMO0001 Reserved for inherently not codable concepts without codable children: Secondary | ICD-10-CM | POA: Diagnosis not present

## 2013-06-20 ENCOUNTER — Other Ambulatory Visit: Payer: Self-pay

## 2013-06-20 DIAGNOSIS — Z1231 Encounter for screening mammogram for malignant neoplasm of breast: Secondary | ICD-10-CM

## 2013-06-25 ENCOUNTER — Other Ambulatory Visit: Payer: Self-pay | Admitting: Dermatology

## 2013-06-25 DIAGNOSIS — L570 Actinic keratosis: Secondary | ICD-10-CM | POA: Diagnosis not present

## 2013-06-30 DIAGNOSIS — IMO0001 Reserved for inherently not codable concepts without codable children: Secondary | ICD-10-CM | POA: Diagnosis not present

## 2013-06-30 DIAGNOSIS — G8929 Other chronic pain: Secondary | ICD-10-CM | POA: Diagnosis not present

## 2013-07-09 ENCOUNTER — Ambulatory Visit
Admission: RE | Admit: 2013-07-09 | Discharge: 2013-07-09 | Disposition: A | Discharge status: Home / Self Care | Payer: Managed Care, Other (non HMO) | Source: Ambulatory Visit

## 2013-07-09 ENCOUNTER — Encounter (INDEPENDENT_AMBULATORY_CARE_PROVIDER_SITE_OTHER): Payer: Self-pay

## 2013-07-09 DIAGNOSIS — Z1231 Encounter for screening mammogram for malignant neoplasm of breast: Secondary | ICD-10-CM | POA: Diagnosis not present

## 2013-07-10 DIAGNOSIS — G8929 Other chronic pain: Secondary | ICD-10-CM | POA: Diagnosis not present

## 2013-07-10 DIAGNOSIS — IMO0001 Reserved for inherently not codable concepts without codable children: Secondary | ICD-10-CM | POA: Diagnosis not present

## 2013-07-12 DIAGNOSIS — M810 Age-related osteoporosis without current pathological fracture: Secondary | ICD-10-CM | POA: Diagnosis not present

## 2013-07-12 DIAGNOSIS — Z0000 Encounter for general adult medical examination without abnormal findings: Secondary | ICD-10-CM | POA: Diagnosis not present

## 2013-07-12 DIAGNOSIS — Z01419 Encounter for gynecological examination (general) (routine) without abnormal findings: Secondary | ICD-10-CM | POA: Diagnosis not present

## 2013-07-12 DIAGNOSIS — Z79890 Hormone replacement therapy (postmenopausal): Secondary | ICD-10-CM | POA: Diagnosis not present

## 2013-08-15 ENCOUNTER — Other Ambulatory Visit: Payer: Self-pay | Admitting: Pain Medicine

## 2013-08-15 DIAGNOSIS — M545 Low back pain, unspecified: Secondary | ICD-10-CM

## 2013-08-15 DIAGNOSIS — M546 Pain in thoracic spine: Secondary | ICD-10-CM

## 2013-08-19 ENCOUNTER — Other Ambulatory Visit: Payer: Managed Care, Other (non HMO)

## 2013-08-26 ENCOUNTER — Ambulatory Visit
Admission: RE | Admit: 2013-08-26 | Discharge: 2013-08-26 | Disposition: A | Discharge status: Home / Self Care | Payer: 59 | Source: Ambulatory Visit | Attending: Pain Medicine | Admitting: Pain Medicine

## 2013-08-26 ENCOUNTER — Ambulatory Visit
Admission: RE | Admit: 2013-08-26 | Discharge: 2013-08-26 | Disposition: A | Discharge status: Home / Self Care | Payer: Managed Care, Other (non HMO) | Source: Ambulatory Visit | Attending: Pain Medicine | Admitting: Pain Medicine

## 2013-08-26 DIAGNOSIS — M545 Low back pain, unspecified: Secondary | ICD-10-CM

## 2013-08-26 DIAGNOSIS — M546 Pain in thoracic spine: Secondary | ICD-10-CM | POA: Diagnosis not present

## 2013-09-08 ENCOUNTER — Emergency Department (HOSPITAL_COMMUNITY)
Admission: EM | Admit: 2013-09-08 | Discharge: 2013-09-08 | Disposition: A | Discharge status: Home / Self Care | Payer: Managed Care, Other (non HMO) | Attending: Emergency Medicine | Admitting: Emergency Medicine

## 2013-09-08 ENCOUNTER — Emergency Department (HOSPITAL_COMMUNITY): Payer: Managed Care, Other (non HMO)

## 2013-09-08 DIAGNOSIS — Z85828 Personal history of other malignant neoplasm of skin: Secondary | ICD-10-CM | POA: Insufficient documentation

## 2013-09-08 DIAGNOSIS — M129 Arthropathy, unspecified: Secondary | ICD-10-CM | POA: Insufficient documentation

## 2013-09-08 DIAGNOSIS — M810 Age-related osteoporosis without current pathological fracture: Secondary | ICD-10-CM | POA: Insufficient documentation

## 2013-09-08 DIAGNOSIS — R072 Precordial pain: Secondary | ICD-10-CM | POA: Diagnosis not present

## 2013-09-08 DIAGNOSIS — Z8701 Personal history of pneumonia (recurrent): Secondary | ICD-10-CM | POA: Insufficient documentation

## 2013-09-08 DIAGNOSIS — G8929 Other chronic pain: Secondary | ICD-10-CM | POA: Insufficient documentation

## 2013-09-08 DIAGNOSIS — Z8742 Personal history of other diseases of the female genital tract: Secondary | ICD-10-CM | POA: Insufficient documentation

## 2013-09-08 DIAGNOSIS — Z8639 Personal history of other endocrine, nutritional and metabolic disease: Secondary | ICD-10-CM | POA: Insufficient documentation

## 2013-09-08 DIAGNOSIS — Z79899 Other long term (current) drug therapy: Secondary | ICD-10-CM | POA: Insufficient documentation

## 2013-09-08 DIAGNOSIS — Z91040 Latex allergy status: Secondary | ICD-10-CM | POA: Insufficient documentation

## 2013-09-08 DIAGNOSIS — IMO0002 Reserved for concepts with insufficient information to code with codable children: Secondary | ICD-10-CM | POA: Insufficient documentation

## 2013-09-08 DIAGNOSIS — Z8719 Personal history of other diseases of the digestive system: Secondary | ICD-10-CM | POA: Insufficient documentation

## 2013-09-08 DIAGNOSIS — Z87442 Personal history of urinary calculi: Secondary | ICD-10-CM | POA: Insufficient documentation

## 2013-09-08 DIAGNOSIS — F411 Generalized anxiety disorder: Secondary | ICD-10-CM | POA: Insufficient documentation

## 2013-09-08 DIAGNOSIS — R519 Headache, unspecified: Secondary | ICD-10-CM

## 2013-09-08 DIAGNOSIS — G43909 Migraine, unspecified, not intractable, without status migrainosus: Secondary | ICD-10-CM | POA: Insufficient documentation

## 2013-09-08 DIAGNOSIS — R0789 Other chest pain: Secondary | ICD-10-CM

## 2013-09-08 DIAGNOSIS — Z862 Personal history of diseases of the blood and blood-forming organs and certain disorders involving the immune mechanism: Secondary | ICD-10-CM | POA: Insufficient documentation

## 2013-09-08 DIAGNOSIS — Z87891 Personal history of nicotine dependence: Secondary | ICD-10-CM | POA: Insufficient documentation

## 2013-09-08 DIAGNOSIS — R51 Headache: Secondary | ICD-10-CM | POA: Insufficient documentation

## 2013-09-08 LAB — CBC WITH DIFFERENTIAL/PLATELET
BASOS ABS: 0 10*3/uL (ref 0.0–0.1)
Basophils Relative: 0 % (ref 0–1)
Eosinophils Absolute: 0.1 10*3/uL (ref 0.0–0.7)
Eosinophils Relative: 1 % (ref 0–5)
HEMATOCRIT: 36.8 % (ref 36.0–46.0)
Hemoglobin: 12.1 g/dL (ref 12.0–15.0)
LYMPHS PCT: 14 % (ref 12–46)
Lymphs Abs: 1.8 10*3/uL (ref 0.7–4.0)
MCH: 28.5 pg (ref 26.0–34.0)
MCHC: 32.9 g/dL (ref 30.0–36.0)
MCV: 86.8 fL (ref 78.0–100.0)
MONO ABS: 0.8 10*3/uL (ref 0.1–1.0)
Monocytes Relative: 6 % (ref 3–12)
NEUTROS ABS: 10.4 10*3/uL — AB (ref 1.7–7.7)
Neutrophils Relative %: 79 % — ABNORMAL HIGH (ref 43–77)
Platelets: 300 10*3/uL (ref 150–400)
RBC: 4.24 MIL/uL (ref 3.87–5.11)
RDW: 15.4 % (ref 11.5–15.5)
WBC: 13 10*3/uL — AB (ref 4.0–10.5)

## 2013-09-08 LAB — BASIC METABOLIC PANEL
ANION GAP: 15 (ref 5–15)
BUN: 15 mg/dL (ref 6–23)
CHLORIDE: 104 meq/L (ref 96–112)
CO2: 22 meq/L (ref 19–32)
Calcium: 8.6 mg/dL (ref 8.4–10.5)
Creatinine, Ser: 0.72 mg/dL (ref 0.50–1.10)
GFR calc Af Amer: >90 mL/min (ref >90)
GFR calc non Af Amer: >90 mL/min (ref >90)
Glucose, Bld: 92 mg/dL (ref 70–99)
POTASSIUM: 3.7 meq/L (ref 3.7–5.3)
Sodium: 141 mEq/L (ref 137–147)

## 2013-09-08 LAB — TROPONIN I

## 2013-09-08 LAB — I-STAT CG4 LACTIC ACID, ED: LACTIC ACID, VENOUS: 1.75 mmol/L (ref 0.5–2.2)

## 2013-09-08 NOTE — ED Notes (Signed)
Pt has normal sinus rhythm when sitting up in bed, when lying patients rate drops to high 40's low 50's beats per minute.

## 2013-09-08 NOTE — ED Notes (Signed)
Pt Belongings: has five bottles of Pills and one Cell phone charger with her.

## 2013-09-08 NOTE — Discharge Instructions (Signed)
Your workup today has not shown a specific cause for your pain.  No signs of acute heart damage were noted on your exam.  Please followup with your primary care Dr. for further workup.  Take medications as prescribed.   Chest Pain Observation It is often hard to give a specific diagnosis for the cause of chest pain. Among other possibilities your symptoms might be caused by inadequate oxygen delivery to your heart (angina). Angina that is not treated or evaluated can lead to a heart attack (myocardial infarction) or death. Blood tests, electrocardiograms, and X-rays may have been done to help determine a possible cause of your chest pain. After evaluation and observation, your health care provider has determined that it is unlikely your pain was caused by an unstable condition that requires hospitalization. However, a full evaluation of your pain may need to be completed, with additional diagnostic testing as directed. It is very important to keep your follow-up appointments. Not keeping your follow-up appointments could result in permanent heart damage, disability, or death. If there is any problem keeping your follow-up appointments, you must call your health care provider. HOME CARE INSTRUCTIONS  Due to the slight chance that your pain could be angina, it is important to follow your health care provider's treatment plan and also maintain a healthy lifestyle:  Maintain or work toward achieving a healthy weight.  Stay physically active and exercise regularly.  Decrease your salt intake.  Eat a balanced, healthy diet. Talk to a dietitian to learn about heart-healthy foods.  Increase your fiber intake by including whole grains, vegetables, fruits, and nuts in your diet.  Avoid situations that cause stress, anger, or depression.  Take medicines as advised by your health care provider. Report any side effects to your health care provider. Do not stop medicines or adjust the dosages on your  own.  Quit smoking. Do not use nicotine patches or gum until you check with your health care provider.  Keep your blood pressure, blood sugar, and cholesterol levels within normal limits.  Limit alcohol intake to no more than 1 drink per day for women who are not pregnant and 2 drinks per day for men.  Do not abuse drugs. SEEK IMMEDIATE MEDICAL CARE IF: You have severe chest pain or pressure which may include symptoms such as:  You feel pain or pressure in your arms, neck, jaw, or back.  You have severe back or abdominal pain, feel sick to your stomach (nauseous), or throw up (vomit).  You are sweating profusely.  You are having a fast or irregular heartbeat.  You feel short of breath while at rest.  You notice increasing shortness of breath during rest, sleep, or with activity.  You have chest pain that does not get better after rest or after taking your usual medicine.  You wake from sleep with chest pain.  You are unable to sleep because you cannot breathe.  You develop a frequent cough or you are coughing up blood.  You feel dizzy, faint, or experience extreme fatigue.  You develop severe weakness, dizziness, fainting, or chills. Any of these symptoms may represent a serious problem that is an emergency. Do not wait to see if the symptoms will go away. Call your local emergency services (911 in the [place] Do not drive yourself to the hospital. MAKE SURE YOU:  Understand these instructions.  Will watch your condition.  Will get help right away if you are not doing well or get worse. Document Released: 03/13/2010  Document Revised: 02/13/2013 Document Reviewed: 08/10/2012 [facility] Patient Information 2015 [place], [place]. This information is not intended to replace advice given to you by your health care provider. Make sure you discuss any questions you have with your health care provider.

## 2013-09-08 NOTE — ED Notes (Signed)
Pt arrives via EMS from home. States that she had a sudden onset of lt headache which radiated to lt neck, arm,chest and epigastric. Pt also c/o nausea/ sweating. Pt took 324 ASA at home. EMS gave 1 Ntg which took pain from 10 to 1. EMS gave 4 mg zofran. EKG showed SR. BP 120/64. 22 in RH.

## 2013-09-08 NOTE — ED Notes (Signed)
Pt taken to xray 
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
## 2013-09-08 NOTE — ED Notes (Signed)
Per EMS: Pt report CP that started in her epigastric area and worked itself upward. Pt then reported the pain was in the middle of her chest and radiated to her back, and down her left arm. Pt was administered ASA by EMS and 1 NTG. Pt original pain score was 10/10, after NTG pain is 2/10. Pt Ax4, NAD.

## 2013-09-19 ENCOUNTER — Telehealth: Payer: Self-pay | Admitting: *Deleted

## 2013-09-19 NOTE — Telephone Encounter (Signed)
Kari Green, new patient coordinator asked for this nurse assistance with referral for Bone marrow Biopsy.  Reviewed chart information and called patient for further information to assist with determining reason for referral.  Kari Green states "Mom has a rare lymphoma that does not show up with labs.  She kept going to the ER thinking she was having a heart attack and after several test her Lymphoma was found.  It's so rare they couldn't find a sub-type.  She is one of twelve and all her siblings have had a type of cancer including her aunts and uncles.  My bones hurt, the bones crumble with every surgery having knee replacement every three years.  I'm not going to have any bone.  My MRI and CT from January 2015 show I broke a bone in three places. Dr. Tamera Green asked if I had bone cancer because he's never seen bone like mine in his career except with someone who has bone cancer.  Orthopedic insist I see oncologist for Bone marrow Bx.  They are not able to send any records stating my xray information is already in the computer.  I bruise easily and am very tired.  I take Vit B 12 injections weekly."  Shared this information, family history per EPIC and 09-08-2013 lab results with on-call provider.  No need for oncology referral at this time.  Called Dr. Aaron Edelman Green's office and notified Kari Green [phone]) who asked that i call patient.  Called patient & instructed patient to have orthopedic provider order Bone marrow Biopsy through [facility].  If oncology is needed, orthopedics can refer to oncology.

## 2013-09-25 ENCOUNTER — Ambulatory Visit: Payer: Medicare Other | Admitting: Neurology

## 2013-10-24 ENCOUNTER — Ambulatory Visit (INDEPENDENT_AMBULATORY_CARE_PROVIDER_SITE_OTHER): Payer: Managed Care, Other (non HMO) | Admitting: Neurology

## 2013-10-24 ENCOUNTER — Encounter: Payer: Self-pay | Admitting: Neurology

## 2013-10-24 VITALS — BP 102/68 | HR 74 | Temp 98.0°F | Resp 18 | Ht 62.0 in | Wt 123.2 lb

## 2013-10-24 DIAGNOSIS — R413 Other amnesia: Secondary | ICD-10-CM | POA: Insufficient documentation

## 2013-10-24 NOTE — Patient Instructions (Signed)
We will refer you for neuropsychological testing to assess in greater detail for reason for memory problems.  Follow up afterwards.
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
## 2013-10-25 NOTE — Progress Notes (Signed)
Note faxed to Dr Alyson Ingles at [phone] with confirmation received.

## 2013-11-09 DIAGNOSIS — L0100 Impetigo, unspecified: Secondary | ICD-10-CM | POA: Diagnosis not present

## 2013-11-09 DIAGNOSIS — Z23 Encounter for immunization: Secondary | ICD-10-CM | POA: Diagnosis not present

## 2013-11-12 ENCOUNTER — Telehealth: Payer: Self-pay | Admitting: Neurology

## 2013-11-22 ENCOUNTER — Telehealth: Payer: Self-pay | Admitting: Neurology

## 2013-11-22 ENCOUNTER — Telehealth: Payer: Self-pay | Admitting: *Deleted

## 2013-11-22 NOTE — Telephone Encounter (Signed)
Patient is aware of appt with Dr Lyman Speller on 12/28/13 at 9 am . This referral was put in the system on 10/24/13 I explained to the patient that Dr Lyman Speller office would be the one to call with the appt her order and office note had already been faxed  To [phone] and that it is not that an appt can always be gotten in a short time frame  Sometimes it takes 2 to 3 months before an appt can be gotten . I gave her the telephone number however she called back and did not have the correct number it took 4 times repeating it before she understood the correct number

## 2013-12-10 ENCOUNTER — Other Ambulatory Visit: Payer: Self-pay | Admitting: Dermatology

## 2013-12-10 DIAGNOSIS — L905 Scar conditions and fibrosis of skin: Secondary | ICD-10-CM | POA: Diagnosis not present

## 2014-01-02 ENCOUNTER — Encounter (HOSPITAL_BASED_OUTPATIENT_CLINIC_OR_DEPARTMENT_OTHER): Payer: Self-pay

## 2014-01-02 ENCOUNTER — Emergency Department (HOSPITAL_BASED_OUTPATIENT_CLINIC_OR_DEPARTMENT_OTHER)
Admission: EM | Admit: 2014-01-02 | Discharge: 2014-01-02 | Disposition: A | Discharge status: Home / Self Care | Payer: Managed Care, Other (non HMO) | Attending: Emergency Medicine | Admitting: Emergency Medicine

## 2014-01-02 DIAGNOSIS — F419 Anxiety disorder, unspecified: Secondary | ICD-10-CM | POA: Insufficient documentation

## 2014-01-02 DIAGNOSIS — Y848 Other medical procedures as the cause of abnormal reaction of the patient, or of later complication, without mention of misadventure at the time of the procedure: Secondary | ICD-10-CM | POA: Insufficient documentation

## 2014-01-02 DIAGNOSIS — Z8701 Personal history of pneumonia (recurrent): Secondary | ICD-10-CM | POA: Diagnosis not present

## 2014-01-02 DIAGNOSIS — I809 Phlebitis and thrombophlebitis of unspecified site: Secondary | ICD-10-CM

## 2014-01-02 DIAGNOSIS — E039 Hypothyroidism, unspecified: Secondary | ICD-10-CM | POA: Diagnosis not present

## 2014-01-02 DIAGNOSIS — T82898A Other specified complication of vascular prosthetic devices, implants and grafts, initial encounter: Secondary | ICD-10-CM

## 2014-01-02 DIAGNOSIS — T8249XA Other complication of vascular dialysis catheter, initial encounter: Secondary | ICD-10-CM | POA: Diagnosis not present

## 2014-01-02 DIAGNOSIS — I808 Phlebitis and thrombophlebitis of other sites: Secondary | ICD-10-CM | POA: Insufficient documentation

## 2014-01-02 DIAGNOSIS — R51 Headache: Secondary | ICD-10-CM | POA: Diagnosis not present

## 2014-01-02 DIAGNOSIS — T80818A Extravasation of other vesicant agent, initial encounter: Secondary | ICD-10-CM | POA: Diagnosis not present

## 2014-01-02 DIAGNOSIS — Z85820 Personal history of malignant melanoma of skin: Secondary | ICD-10-CM | POA: Diagnosis not present

## 2014-01-02 DIAGNOSIS — M1990 Unspecified osteoarthritis, unspecified site: Secondary | ICD-10-CM | POA: Insufficient documentation

## 2014-01-02 DIAGNOSIS — Z91040 Latex allergy status: Secondary | ICD-10-CM | POA: Insufficient documentation

## 2014-01-02 DIAGNOSIS — Z8719 Personal history of other diseases of the digestive system: Secondary | ICD-10-CM | POA: Insufficient documentation

## 2014-01-02 DIAGNOSIS — Z79899 Other long term (current) drug therapy: Secondary | ICD-10-CM | POA: Insufficient documentation

## 2014-01-02 DIAGNOSIS — M797 Fibromyalgia: Secondary | ICD-10-CM | POA: Insufficient documentation

## 2014-01-02 DIAGNOSIS — Z87442 Personal history of urinary calculi: Secondary | ICD-10-CM | POA: Diagnosis not present

## 2014-01-02 DIAGNOSIS — T801XXA Vascular complications following infusion, transfusion and therapeutic injection, initial encounter: Secondary | ICD-10-CM

## 2014-01-02 DIAGNOSIS — G8929 Other chronic pain: Secondary | ICD-10-CM | POA: Insufficient documentation

## 2014-01-02 DIAGNOSIS — Z87891 Personal history of nicotine dependence: Secondary | ICD-10-CM | POA: Insufficient documentation

## 2014-01-02 DIAGNOSIS — Z7951 Long term (current) use of inhaled steroids: Secondary | ICD-10-CM | POA: Insufficient documentation

## 2014-01-02 DIAGNOSIS — R2232 Localized swelling, mass and lump, left upper limb: Secondary | ICD-10-CM | POA: Diagnosis present

## 2014-01-02 DIAGNOSIS — Z85828 Personal history of other malignant neoplasm of skin: Secondary | ICD-10-CM | POA: Insufficient documentation
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
## 2014-01-02 NOTE — Discharge Instructions (Signed)
Please take you pain medication at home. Warm compresses at home. Phlebitis Phlebitis is soreness and swelling (inflammation) of a vein. This can occur in your arms, legs, or torso (trunk), as well as deeper inside your body. Phlebitis is usually not serious when it occurs close to the surface of the body. However, it can cause serious problems when it occurs in a vein deeper inside the body. CAUSES  Phlebitis can be triggered by various things, including:   Reduced blood flow through your veins. This can happen with:  Bed rest over a long period.  Long-distance travel.  Injury.  Surgery.  Being overweight (obese) or pregnant.  Having an IV tube put in the vein and getting certain medicines through the vein.  Cancer and cancer treatment.  Use of illegal drugs taken through the vein.  Inflammatory diseases.  Inherited (genetic) diseases that increase the risk of blood clots.  Hormone therapy, such as birth control pills. SIGNS AND SYMPTOMS   Red, tender, swollen, and painful area on your skin. Usually, the area will be long and narrow.  Firmness along the center of the affected area. This can indicate that a blood clot has formed.  Low-grade fever. DIAGNOSIS  A health care provider can usually diagnose phlebitis by examining the affected area and asking about your symptoms. To check for infection or blood clots, your health care provider may order blood tests or an ultrasound exam of the area. Blood tests and your family history may also indicate if you have an underlying genetic disease that causes blood clots. Occasionally, a piece of tissue is taken from the body (biopsy sample) if an unusual cause of phlebitis is suspected. TREATMENT  Treatment will vary depending on the severity of the condition and the area of the body affected. Treatment may include:  Use of a warm compress or heating pad.  Use of compression stockings or bandages.  Anti-inflammatory  medicines.  Removal of any IV tube that may be causing the problem.  Medicines that kill germs (antibiotics) if an infection is present.  Blood-thinning medicines if a blood clot is suspected or present.  In rare cases, surgery may be needed to remove damaged sections of vein. HOME CARE INSTRUCTIONS   Only take over-the-counter or prescription medicines as directed by your health care provider. Take all medicines exactly as prescribed.  Raise (elevate) the affected area above the level of your heart as directed by your health care provider.  Apply a warm compress or heating pad to the affected area as directed by your health care provider. Do not sleep with the heating pad.  Use compression stockings or bandages as directed. These will speed healing and prevent the condition from coming back.  If you are on blood thinners:  Get follow-up blood tests as directed by your health care provider.  Check with your health care provider before using any new medicines.  Carry a medical alert card or wear your medical alert jewelry to show that you are on blood thinners.  For phlebitis in the legs:  Avoid prolonged standing or bed rest.  Keep your legs moving. Raise your legs when sitting or lying.  Do not smoke.  Women, particularly those over the age of 42, should consider the risks and benefits of taking the contraceptive pill. This kind of hormone treatment can increase your risk for blood clots.  Follow up with your health care provider as directed. SEEK MEDICAL CARE IF:   You have unusual bruising or any  bleeding problems.  Your swelling or pain in the affected area is not improving.  You are on anti-inflammatory medicine, and you develop belly (abdominal) pain. SEEK IMMEDIATE MEDICAL CARE IF:   You have a sudden onset of chest pain or difficulty breathing.  You have a fever or persistent symptoms for more than 2-3 days.  You have a fever and your symptoms suddenly get  worse. MAKE SURE YOU:  Understand these instructions.  Will watch your condition.  Will get help right away if you are not doing well or get worse. Document Released: 02/02/2001 Document Revised: 11/29/2012 Document Reviewed: 10/16/2012 [facility] Patient Information 2015 [place], [place]. This information is not intended to replace advice given to you by your health care provider. Make sure you discuss any questions you have with your health care provider.

## 2014-01-02 NOTE — ED Notes (Signed)
Left hand/arm swelling-IV placed and d/c yesterday

## 2014-01-02 NOTE — ED Notes (Signed)
Had iv started in left hand yesterday  Was red last pm  Today increased swelling and pain

## 2014-02-22 DIAGNOSIS — J189 Pneumonia, unspecified organism: Secondary | ICD-10-CM

## 2014-02-22 HISTORY — DX: Pneumonia, unspecified organism: J18.9

## 2014-02-27 DIAGNOSIS — T887XXA Unspecified adverse effect of drug or medicament, initial encounter: Secondary | ICD-10-CM | POA: Diagnosis not present

## 2014-02-27 DIAGNOSIS — F449 Dissociative and conversion disorder, unspecified: Secondary | ICD-10-CM | POA: Diagnosis not present

## 2014-02-27 DIAGNOSIS — T450X5A Adverse effect of antiallergic and antiemetic drugs, initial encounter: Secondary | ICD-10-CM | POA: Diagnosis not present

## 2014-02-27 DIAGNOSIS — F99 Mental disorder, not otherwise specified: Secondary | ICD-10-CM | POA: Diagnosis not present

## 2014-02-27 DIAGNOSIS — R413 Other amnesia: Secondary | ICD-10-CM | POA: Diagnosis not present

## 2014-03-13 DIAGNOSIS — S0502XA Injury of conjunctiva and corneal abrasion without foreign body, left eye, initial encounter: Secondary | ICD-10-CM | POA: Diagnosis not present

## 2014-03-19 DIAGNOSIS — M25561 Pain in right knee: Secondary | ICD-10-CM | POA: Diagnosis not present

## 2014-03-25 ENCOUNTER — Encounter: Payer: Self-pay | Admitting: Neurology

## 2014-03-25 ENCOUNTER — Ambulatory Visit (INDEPENDENT_AMBULATORY_CARE_PROVIDER_SITE_OTHER): Payer: BLUE CROSS/BLUE SHIELD | Admitting: Neurology

## 2014-03-25 VITALS — BP 90/60 | HR 77 | Ht 62.5 in | Wt 131.6 lb

## 2014-03-25 DIAGNOSIS — G43719 Chronic migraine without aura, intractable, without status migrainosus: Secondary | ICD-10-CM

## 2014-03-25 DIAGNOSIS — R413 Other amnesia: Secondary | ICD-10-CM
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
## 2014-03-25 NOTE — Patient Instructions (Signed)
We will try to get approval for botox. Please read the literature.  We will get back to you.

## 2014-03-26 ENCOUNTER — Encounter: Payer: Self-pay | Admitting: *Deleted

## 2014-03-27 DIAGNOSIS — H1012 Acute atopic conjunctivitis, left eye: Secondary | ICD-10-CM | POA: Diagnosis not present

## 2014-04-02 NOTE — Telephone Encounter (Signed)
error 

## 2014-04-03 NOTE — Telephone Encounter (Signed)
error 

## 2014-04-09 DIAGNOSIS — M47812 Spondylosis without myelopathy or radiculopathy, cervical region: Secondary | ICD-10-CM | POA: Diagnosis not present

## 2014-04-09 DIAGNOSIS — M542 Cervicalgia: Secondary | ICD-10-CM | POA: Diagnosis not present

## 2014-04-11 ENCOUNTER — Emergency Department (HOSPITAL_BASED_OUTPATIENT_CLINIC_OR_DEPARTMENT_OTHER)
Admission: EM | Admit: 2014-04-11 | Discharge: 2014-04-12 | Disposition: A | Discharge status: Home / Self Care | Payer: BLUE CROSS/BLUE SHIELD | Attending: Emergency Medicine | Admitting: Emergency Medicine

## 2014-04-11 ENCOUNTER — Encounter (HOSPITAL_BASED_OUTPATIENT_CLINIC_OR_DEPARTMENT_OTHER): Payer: Self-pay

## 2014-04-11 DIAGNOSIS — N390 Urinary tract infection, site not specified: Secondary | ICD-10-CM | POA: Diagnosis not present

## 2014-04-11 DIAGNOSIS — Z91040 Latex allergy status: Secondary | ICD-10-CM | POA: Insufficient documentation

## 2014-04-11 DIAGNOSIS — R319 Hematuria, unspecified: Secondary | ICD-10-CM | POA: Diagnosis not present

## 2014-04-11 DIAGNOSIS — Z79899 Other long term (current) drug therapy: Secondary | ICD-10-CM | POA: Insufficient documentation

## 2014-04-11 DIAGNOSIS — Z8719 Personal history of other diseases of the digestive system: Secondary | ICD-10-CM | POA: Diagnosis not present

## 2014-04-11 DIAGNOSIS — M797 Fibromyalgia: Secondary | ICD-10-CM | POA: Diagnosis not present

## 2014-04-11 DIAGNOSIS — B9689 Other specified bacterial agents as the cause of diseases classified elsewhere: Secondary | ICD-10-CM | POA: Diagnosis not present

## 2014-04-11 DIAGNOSIS — N938 Other specified abnormal uterine and vaginal bleeding: Secondary | ICD-10-CM | POA: Diagnosis present

## 2014-04-11 LAB — URINALYSIS, ROUTINE W REFLEX MICROSCOPIC
Bilirubin Urine: NEGATIVE
GLUCOSE, UA: NEGATIVE mg/dL
Ketones, ur: NEGATIVE mg/dL
Nitrite: POSITIVE — AB
PH: 6.5 (ref 5.0–8.0)
Protein, ur: NEGATIVE mg/dL
Specific Gravity, Urine: 1.02 (ref 1.005–1.030)
Urobilinogen, UA: 0.2 mg/dL (ref 0.0–1.0)

## 2014-04-11 LAB — URINE MICROSCOPIC-ADD ON

## 2014-04-11 LAB — OCCULT BLOOD X 1 CARD TO LAB, STOOL: FECAL OCCULT BLD: NEGATIVE

## 2014-04-11 MED ORDER — HYDROCODONE-ACETAMINOPHEN 5-325 MG PO TABS: 2.0000 | ORAL_TABLET | ORAL | Status: DC | PRN

## 2014-04-11 MED ORDER — CEPHALEXIN 250 MG PO CAPS
500.0000 mg | ORAL_CAPSULE | Freq: Once | ORAL | Status: AC
  Administered 2014-04-11: 500 mg via ORAL
  Filled 2014-04-11: qty 2

## 2014-04-11 MED ORDER — CEPHALEXIN 500 MG PO CAPS: 500.0000 mg | ORAL_CAPSULE | Freq: Three times a day (TID) | ORAL | Status: DC

## 2014-04-11 MED ORDER — OXYCODONE-ACETAMINOPHEN 5-325 MG PO TABS
1.0000 | ORAL_TABLET | Freq: Once | ORAL | Status: DC
  Filled 2014-04-11: qty 1

## 2014-04-11 NOTE — Discharge Instructions (Signed)
Your urine tonight is infected and you have blood in your urine. We are treating the infection with antibiotics and treating your pain. We have sent the urine for culture and will call you if we need to change your antibiotic. Your exam shows no blood in the vagina and no blood from the rectal exam.  Follow up with your doctor. Return here as needed for problems.

## 2014-04-11 NOTE — ED Notes (Signed)
C/o vaginal bleeding just PTA-no pad in place-hysterectomy 2000

## 2014-04-11 NOTE — ED Provider Notes (Signed)
CSN: [id number]     Arrival date & time 04/11/14  2127 History   First MD Initiated Contact with Patient 04/11/14 2317     Chief Complaint  Patient presents with  . Vaginal Bleeding     (Consider location/radiation/quality/duration/timing/severity/associated sxs/prior Treatment) HPI Kari Green is a 49 y.o. female presents to the ED with complaint of vaginal bleeding that started just prior to arrival to the ED . Hx of hysterectomy 2000.  Patient states that tonight when she went to the bathroom to void she wiped and there was a lot of blood. She was concerned because the bleeding seemed to be coming from the vagina. She recently had a UTI and took antibiotics and Diflucan. She has had frequent urination and urgency.   Past Medical History  Diagnosis Date  . Multiple allergies   . Fibromyalgia   . Hiatal hernia    Past Surgical History  Procedure Laterality Date  . Abdominal hysterectomy    . Appendectomy    . Cholecystectomy    . Tonsillectomy    . Thyroid surgery    . Knee surgery     No family history on file. History  Substance Use Topics  . Smoking status: Never Smoker   . Smokeless tobacco: Not on file  . Alcohol Use: No   OB History    No data available     Review of Systems Negative except as stated in HPI   Allergies  Dye fdc red; Latex; Monistat; and Tape  Home Medications   Prior to Admission medications   Medication Sig Start Date End Date Taking? Authorizing Provider  ALPRAZolam (XANAX PO) Take by mouth.   Yes Historical Provider, MD  Cyanocobalamin (VITAMIN B-12 IJ) Inject as directed.   Yes Historical Provider, MD  ESTERIFIED ESTROGENS PO Take by mouth.   Yes Historical Provider, MD  HYDROXYZINE HCL PO Take by mouth.   Yes Historical Provider, MD  Levothyroxine Sodium (SYNTHROID PO) Take by mouth.   Yes Historical Provider, MD  Tapentadol HCl (NUCYNTA ER PO) Take by mouth.   Yes Historical Provider, MD  Topiramate (TOPAMAX PO) Take by mouth.    Yes Historical Provider, MD  cephALEXin (KEFLEX) 500 MG capsule Take 1 capsule (500 mg total) by mouth 3 (three) times daily. 04/11/14   Orry Sigl Bunnie Pion, NP  HYDROcodone-acetaminophen (NORCO/VICODIN) 5-325 MG per tablet Take 2 tablets by mouth every 4 (four) hours as needed. 04/11/14   Britian Jentz Bunnie Pion, NP   BP 138/100 mmHg  Pulse 79  Temp(Src) 98 F (36.7 C) (Oral)  Resp 18  Ht 5' 2.5" (1.588 m)  Wt 128 lb (58.06 kg)  BMI 23.02 kg/m2  SpO2 100% Physical Exam  Constitutional: She is oriented to person, place, and time. She appears well-developed and well-nourished.  HENT:  Head: Normocephalic and atraumatic.  Eyes: EOM are normal.  Neck: Neck supple.  Cardiovascular: Normal rate and regular rhythm.   Pulmonary/Chest: Effort normal and breath sounds normal.  Abdominal: Soft. There is tenderness in the suprapubic area. There is no rebound, no guarding and no CVA tenderness.  Genitourinary:  External genitalia without lesions, white d/c vaginal vault. No blood noted. Cervix absent, normal appearing cuff repair. No adnexal tenderness or mass palpated. Uterus absent.   Musculoskeletal: Normal range of motion.  Neurological: She is alert and oriented to person, place, and time. No cranial nerve deficit.  Skin: Skin is warm and dry.  Psychiatric: She has a normal mood and affect. Her behavior is  normal.  Nursing note and vitals reviewed.   ED Course  Procedures (including critical care time) Labs Review Results for orders placed or performed during the hospital encounter of 04/11/14 (from the past 24 hour(s))  Urinalysis, Routine w reflex microscopic     Status: Abnormal   Collection Time: 04/11/14  9:58 PM  Result Value Ref Range   Color, Urine YELLOW YELLOW   APPearance CLOUDY (A) CLEAR   Specific Gravity, Urine 1.020 1.005 - 1.030   pH 6.5 5.0 - 8.0   Glucose, UA NEGATIVE NEGATIVE mg/dL   Hgb urine dipstick LARGE (A) NEGATIVE   Bilirubin Urine NEGATIVE NEGATIVE   Ketones, ur  NEGATIVE NEGATIVE mg/dL   Protein, ur NEGATIVE NEGATIVE mg/dL   Urobilinogen, UA 0.2 0.0 - 1.0 mg/dL   Nitrite POSITIVE (A) NEGATIVE   Leukocytes, UA MODERATE (A) NEGATIVE  Urine microscopic-add on     Status: Abnormal   Collection Time: 04/11/14  9:58 PM  Result Value Ref Range   Squamous Epithelial / LPF MANY (A) RARE   WBC, UA 11-20 <3 WBC/hpf   RBC / HPF 21-50 <3 RBC/hpf   Bacteria, UA MANY (A) RARE   Crystals CA OXALATE CRYSTALS (A) NEGATIVE  Occult blood card to lab, stool     Status: None   Collection Time: 04/11/14 11:30 PM  Result Value Ref Range   Fecal Occult Bld NEGATIVE NEGATIVE    Imaging Reviewed  MDM  49 y.o. female with hematuria, suprapubic pain and urinary frequency. Stable for d/c with treatment for UTI without fever or signs of pyelonephritis. Discussed with the patient clinical and lab findings and plan of care. All questioned fully answered. She will return if any problems arise.  Final diagnoses:  UTI (lower urinary tract infection)  Hematuria       Ashley Murrain, NP 04/11/14 [place], MD 04/12/14 [phone]

## 2014-04-12 DIAGNOSIS — M898X1 Other specified disorders of bone, shoulder: Secondary | ICD-10-CM | POA: Diagnosis not present

## 2014-04-14 LAB — URINE CULTURE: Colony Count: >=100000

## 2014-04-15 ENCOUNTER — Telehealth (HOSPITAL_BASED_OUTPATIENT_CLINIC_OR_DEPARTMENT_OTHER): Payer: Self-pay | Admitting: Emergency Medicine

## 2014-04-15 NOTE — Telephone Encounter (Signed)
Post ED Visit - Positive Culture Follow-up  Culture report reviewed by antimicrobial stewardship pharmacist: []  Wes Dulaney, Pharm.D., BCPS [x]  Heide Guile, Pharm.D., BCPS []  Alycia Rossetti, Pharm.D., BCPS []  [place], Pharm.D., BCPS, AAHIVP []  Legrand Como, Pharm.D., BCPS, AAHIVP []  Isac Sarna, Pharm.D., BCPS  Positive urine culture Klebsiella Treated with cephalexin, organism sensitive to the same and no further patient follow-up is required at this time.  Hazle Nordmann 04/15/2014, 10:01 AM

## 2014-04-16 DIAGNOSIS — N200 Calculus of kidney: Secondary | ICD-10-CM | POA: Diagnosis not present

## 2014-04-16 DIAGNOSIS — R310 Gross hematuria: Secondary | ICD-10-CM | POA: Diagnosis not present

## 2014-04-17 ENCOUNTER — Other Ambulatory Visit: Payer: Self-pay | Admitting: Urology

## 2014-04-19 ENCOUNTER — Encounter: Payer: Self-pay | Admitting: Neurology

## 2014-04-23 DIAGNOSIS — L821 Other seborrheic keratosis: Secondary | ICD-10-CM | POA: Diagnosis not present

## 2014-04-23 DIAGNOSIS — I788 Other diseases of capillaries: Secondary | ICD-10-CM | POA: Diagnosis not present

## 2014-04-23 DIAGNOSIS — D225 Melanocytic nevi of trunk: Secondary | ICD-10-CM | POA: Diagnosis not present

## 2014-04-23 DIAGNOSIS — L814 Other melanin hyperpigmentation: Secondary | ICD-10-CM | POA: Diagnosis not present

## 2014-04-23 DIAGNOSIS — Z87442 Personal history of urinary calculi: Secondary | ICD-10-CM

## 2014-04-23 DIAGNOSIS — L218 Other seborrheic dermatitis: Secondary | ICD-10-CM | POA: Diagnosis not present

## 2014-04-23 DIAGNOSIS — L281 Prurigo nodularis: Secondary | ICD-10-CM | POA: Diagnosis not present

## 2014-04-23 HISTORY — DX: Personal history of urinary calculi: Z87.442

## 2014-04-24 ENCOUNTER — Encounter (HOSPITAL_BASED_OUTPATIENT_CLINIC_OR_DEPARTMENT_OTHER): Payer: Self-pay | Admitting: *Deleted

## 2014-04-24 ENCOUNTER — Emergency Department (HOSPITAL_BASED_OUTPATIENT_CLINIC_OR_DEPARTMENT_OTHER): Payer: BLUE CROSS/BLUE SHIELD

## 2014-04-24 ENCOUNTER — Emergency Department (HOSPITAL_BASED_OUTPATIENT_CLINIC_OR_DEPARTMENT_OTHER)
Admission: EM | Admit: 2014-04-24 | Discharge: 2014-04-24 | Disposition: A | Discharge status: Home / Self Care | Payer: BLUE CROSS/BLUE SHIELD | Attending: Emergency Medicine | Admitting: Emergency Medicine

## 2014-04-24 DIAGNOSIS — R000 Tachycardia, unspecified: Secondary | ICD-10-CM | POA: Diagnosis not present

## 2014-04-24 DIAGNOSIS — Z85828 Personal history of other malignant neoplasm of skin: Secondary | ICD-10-CM | POA: Diagnosis not present

## 2014-04-24 DIAGNOSIS — Z8701 Personal history of pneumonia (recurrent): Secondary | ICD-10-CM | POA: Diagnosis not present

## 2014-04-24 DIAGNOSIS — M79604 Pain in right leg: Secondary | ICD-10-CM | POA: Diagnosis present

## 2014-04-24 DIAGNOSIS — M797 Fibromyalgia: Secondary | ICD-10-CM | POA: Insufficient documentation

## 2014-04-24 DIAGNOSIS — Z7951 Long term (current) use of inhaled steroids: Secondary | ICD-10-CM | POA: Insufficient documentation

## 2014-04-24 DIAGNOSIS — E039 Hypothyroidism, unspecified: Secondary | ICD-10-CM | POA: Insufficient documentation

## 2014-04-24 DIAGNOSIS — Z87898 Personal history of other specified conditions: Secondary | ICD-10-CM | POA: Diagnosis not present

## 2014-04-24 DIAGNOSIS — G43909 Migraine, unspecified, not intractable, without status migrainosus: Secondary | ICD-10-CM | POA: Diagnosis not present

## 2014-04-24 DIAGNOSIS — Z85820 Personal history of malignant melanoma of skin: Secondary | ICD-10-CM | POA: Insufficient documentation

## 2014-04-24 DIAGNOSIS — N200 Calculus of kidney: Secondary | ICD-10-CM | POA: Diagnosis not present

## 2014-04-24 DIAGNOSIS — M810 Age-related osteoporosis without current pathological fracture: Secondary | ICD-10-CM | POA: Insufficient documentation

## 2014-04-24 DIAGNOSIS — R234 Changes in skin texture: Secondary | ICD-10-CM | POA: Insufficient documentation

## 2014-04-24 DIAGNOSIS — Z79899 Other long term (current) drug therapy: Secondary | ICD-10-CM | POA: Insufficient documentation

## 2014-04-24 DIAGNOSIS — E785 Hyperlipidemia, unspecified: Secondary | ICD-10-CM | POA: Diagnosis not present

## 2014-04-24 DIAGNOSIS — G8929 Other chronic pain: Secondary | ICD-10-CM | POA: Insufficient documentation

## 2014-04-24 DIAGNOSIS — M5430 Sciatica, unspecified side: Secondary | ICD-10-CM | POA: Diagnosis not present

## 2014-04-24 DIAGNOSIS — Z8719 Personal history of other diseases of the digestive system: Secondary | ICD-10-CM | POA: Diagnosis not present

## 2014-04-24 DIAGNOSIS — Z91040 Latex allergy status: Secondary | ICD-10-CM | POA: Insufficient documentation

## 2014-04-24 DIAGNOSIS — Z792 Long term (current) use of antibiotics: Secondary | ICD-10-CM | POA: Diagnosis not present

## 2014-04-24 DIAGNOSIS — R109 Unspecified abdominal pain: Secondary | ICD-10-CM

## 2014-04-24 DIAGNOSIS — Z8669 Personal history of other diseases of the nervous system and sense organs: Secondary | ICD-10-CM | POA: Insufficient documentation

## 2014-04-24 DIAGNOSIS — M1990 Unspecified osteoarthritis, unspecified site: Secondary | ICD-10-CM | POA: Diagnosis not present

## 2014-04-24 DIAGNOSIS — F419 Anxiety disorder, unspecified: Secondary | ICD-10-CM | POA: Diagnosis not present

## 2014-04-24 LAB — URINALYSIS, ROUTINE W REFLEX MICROSCOPIC
BILIRUBIN URINE: NEGATIVE
GLUCOSE, UA: NEGATIVE mg/dL
HGB URINE DIPSTICK: NEGATIVE
KETONES UR: NEGATIVE mg/dL
LEUKOCYTES UA: NEGATIVE
NITRITE: NEGATIVE
PH: 5 (ref 5.0–8.0)
Protein, ur: NEGATIVE mg/dL
SPECIFIC GRAVITY, URINE: 1.015 (ref 1.005–1.030)
Urobilinogen, UA: 0.2 mg/dL (ref 0.0–1.0)

## 2014-04-24 MED ORDER — KETOROLAC TROMETHAMINE 60 MG/2ML IM SOLN
60.0000 mg | Freq: Once | INTRAMUSCULAR | Status: AC
  Administered 2014-04-24: 60 mg via INTRAMUSCULAR
  Filled 2014-04-24: qty 2

## 2014-04-24 MED ORDER — DIPHENHYDRAMINE HCL 50 MG/ML IJ SOLN
25.0000 mg | Freq: Once | INTRAMUSCULAR | Status: AC
  Administered 2014-04-24: 25 mg via INTRAMUSCULAR

## 2014-04-24 MED ORDER — DIPHENHYDRAMINE HCL 50 MG/ML IJ SOLN
INTRAMUSCULAR | Status: AC
  Filled 2014-04-24: qty 1

## 2014-04-24 NOTE — ED Notes (Signed)
Pt reports that since waking up yesterday morning she woke up with pain in bilateral hip that goes down both of her legs. Pt says that she noticed a bit on the back of her neck since the night before the pain started.

## 2014-04-24 NOTE — ED Notes (Signed)
Patient transported to CT 

## 2014-04-24 NOTE — Discharge Instructions (Signed)
Continue your pain medication at home along with the Flexeril that you were prescribed last night. Rest, apply a heating pad. Follow-up with your primary care physician.  Sciatica Sciatica is pain, weakness, numbness, or tingling along the path of the sciatic nerve. The nerve starts in the lower back and runs down the back of each leg. The nerve controls the muscles in the lower leg and in the back of the knee, while also providing sensation to the back of the thigh, lower leg, and the sole of your foot. Sciatica is a symptom of another medical condition. For instance, nerve damage or certain conditions, such as a herniated disk or bone spur on the spine, pinch or put pressure on the sciatic nerve. This causes the pain, weakness, or other sensations normally associated with sciatica. Generally, sciatica only affects one side of the body. CAUSES   Herniated or slipped disc.  Degenerative disk disease.  A pain disorder involving the narrow muscle in the buttocks (piriformis syndrome).  Pelvic injury or fracture.  Pregnancy.  Tumor (rare). SYMPTOMS  Symptoms can vary from mild to very severe. The symptoms usually travel from the low back to the buttocks and down the back of the leg. Symptoms can include:  Mild tingling or dull aches in the lower back, leg, or hip.  Numbness in the back of the calf or sole of the foot.  Burning sensations in the lower back, leg, or hip.  Sharp pains in the lower back, leg, or hip.  Leg weakness.  Severe back pain inhibiting movement. These symptoms may get worse with coughing, sneezing, laughing, or prolonged sitting or standing. Also, being overweight may worsen symptoms. DIAGNOSIS  Your caregiver will perform a physical exam to look for common symptoms of sciatica. He or she may ask you to do certain movements or activities that would trigger sciatic nerve pain. Other tests may be performed to find the cause of the sciatica. These may include:  Blood  tests.  X-rays.  Imaging tests, such as an MRI or CT scan. TREATMENT  Treatment is directed at the cause of the sciatic pain. Sometimes, treatment is not necessary and the pain and discomfort goes away on its own. If treatment is needed, your caregiver may suggest:  Over-the-counter medicines to relieve pain.  Prescription medicines, such as anti-inflammatory medicine, muscle relaxants, or narcotics.  Applying heat or ice to the painful area.  Steroid injections to lessen pain, irritation, and inflammation around the nerve.  Reducing activity during periods of pain.  Exercising and stretching to strengthen your abdomen and improve flexibility of your spine. Your caregiver may suggest losing weight if the extra weight makes the back pain worse.  Physical therapy.  Surgery to eliminate what is pressing or pinching the nerve, such as a bone spur or part of a herniated disk. HOME CARE INSTRUCTIONS   Only take over-the-counter or prescription medicines for pain or discomfort as directed by your caregiver.  Apply ice to the affected area for 20 minutes, 3-4 times a day for the first 48-72 hours. Then try heat in the same way.  Exercise, stretch, or perform your usual activities if these do not aggravate your pain.  Attend physical therapy sessions as directed by your caregiver.  Keep all follow-up appointments as directed by your caregiver.  Do not wear high heels or shoes that do not provide proper support.  Check your mattress to see if it is too soft. A firm mattress may lessen your pain and  discomfort. SEEK IMMEDIATE MEDICAL CARE IF:   You lose control of your bowel or bladder (incontinence).  You have increasing weakness in the lower back, pelvis, buttocks, or legs.  You have redness or swelling of your back.  You have a burning sensation when you urinate.  You have pain that gets worse when you lie down or awakens you at night.  Your pain is worse than you have  experienced in the past.  Your pain is lasting longer than 4 weeks.  You are suddenly losing weight without reason. MAKE SURE YOU:  Understand these instructions.  Will watch your condition.  Will get help right away if you are not doing well or get worse. Document Released: 02/02/2001 Document Revised: 08/10/2011 Document Reviewed: 06/20/2011 [facility] Patient Information 2015 [place], [place]. This information is not intended to replace advice given to you by your health care provider. Make sure you discuss any questions you have with your health care provider.   Kidney Stones Kidney stones (urolithiasis) are deposits that form inside your kidneys. The intense pain is caused by the stone moving through the urinary tract. When the stone moves, the ureter goes into spasm around the stone. The stone is usually passed in the urine.  CAUSES   A disorder that makes certain neck glands produce too much parathyroid hormone (primary hyperparathyroidism).  A buildup of uric acid crystals, similar to gout in your joints.  Narrowing (stricture) of the ureter.  A kidney obstruction present at birth (congenital obstruction).  Previous surgery on the kidney or ureters.  Numerous kidney infections. SYMPTOMS   Feeling sick to your stomach (nauseous).  Throwing up (vomiting).  Blood in the urine (hematuria).  Pain that usually spreads (radiates) to the groin.  Frequency or urgency of urination. DIAGNOSIS   Taking a history and physical exam.  Blood or urine tests.  CT scan.  Occasionally, an examination of the inside of the urinary bladder (cystoscopy) is performed. TREATMENT   Observation.  Increasing your fluid intake.  Extracorporeal shock wave lithotripsy--This is a noninvasive procedure that uses shock waves to break up kidney stones.  Surgery may be needed if you have severe pain or persistent obstruction. There are various surgical procedures. Most of the procedures  are performed with the use of small instruments. Only small incisions are needed to accommodate these instruments, so recovery time is minimized. The size, location, and chemical composition are all important variables that will determine the proper choice of action for you. Talk to your health care provider to better understand your situation so that you will minimize the risk of injury to yourself and your kidney.  HOME CARE INSTRUCTIONS   Drink enough water and fluids to keep your urine clear or pale yellow. This will help you to pass the stone or stone fragments.  Strain all urine through the provided strainer. Keep all particulate matter and stones for your health care provider to see. The stone causing the pain may be as small as a grain of salt. It is very important to use the strainer each and every time you pass your urine. The collection of your stone will allow your health care provider to analyze it and verify that a stone has actually passed. The stone analysis will often identify what you can do to reduce the incidence of recurrences.  Only take over-the-counter or prescription medicines for pain, discomfort, or fever as directed by your health care provider.  Make a follow-up appointment with your health care provider  as directed.  Get follow-up X-rays if required. The absence of pain does not always mean that the stone has passed. It may have only stopped moving. If the urine remains completely obstructed, it can cause loss of kidney function or even complete destruction of the kidney. It is your responsibility to make sure X-rays and follow-ups are completed. Ultrasounds of the kidney can show blockages and the status of the kidney. Ultrasounds are not associated with any radiation and can be performed easily in a matter of minutes. SEEK MEDICAL CARE IF:  You experience pain that is progressive and unresponsive to any pain medicine you have been prescribed. SEEK IMMEDIATE MEDICAL  CARE IF:   Pain cannot be controlled with the prescribed medicine.  You have a fever or shaking chills.  The severity or intensity of pain increases over 18 hours and is not relieved by pain medicine.  You develop a new onset of abdominal pain.  You feel faint or pass out.  You are unable to urinate. MAKE SURE YOU:   Understand these instructions.  Will watch your condition.  Will get help right away if you are not doing well or get worse. Document Released: 02/08/2005 Document Revised: 10/11/2012 Document Reviewed: 07/12/2012 [facility] Patient Information 2015 [place], [place]. This information is not intended to replace advice given to you by your health care provider. Make sure you discuss any questions you have with your health care provider.
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
## 2014-04-25 DIAGNOSIS — M5137 Other intervertebral disc degeneration, lumbosacral region: Secondary | ICD-10-CM | POA: Diagnosis not present

## 2014-04-25 DIAGNOSIS — M47816 Spondylosis without myelopathy or radiculopathy, lumbar region: Secondary | ICD-10-CM | POA: Diagnosis not present

## 2014-04-25 DIAGNOSIS — M533 Sacrococcygeal disorders, not elsewhere classified: Secondary | ICD-10-CM | POA: Diagnosis not present

## 2014-04-25 DIAGNOSIS — M47817 Spondylosis without myelopathy or radiculopathy, lumbosacral region: Secondary | ICD-10-CM | POA: Diagnosis not present

## 2014-05-02 ENCOUNTER — Encounter (HOSPITAL_BASED_OUTPATIENT_CLINIC_OR_DEPARTMENT_OTHER): Payer: Self-pay | Admitting: *Deleted

## 2014-05-02 DIAGNOSIS — M25561 Pain in right knee: Secondary | ICD-10-CM | POA: Diagnosis not present

## 2014-05-02 DIAGNOSIS — M659 Synovitis and tenosynovitis, unspecified: Secondary | ICD-10-CM | POA: Diagnosis not present

## 2014-05-03 ENCOUNTER — Encounter (HOSPITAL_BASED_OUTPATIENT_CLINIC_OR_DEPARTMENT_OTHER): Payer: Self-pay | Admitting: *Deleted

## 2014-05-06 ENCOUNTER — Encounter (HOSPITAL_BASED_OUTPATIENT_CLINIC_OR_DEPARTMENT_OTHER): Payer: Self-pay | Admitting: *Deleted

## 2014-05-06 NOTE — Progress Notes (Signed)
NPO AFTER MN. ARRIVE AT 0830. NEEDS HG. WILL TAKE NUCYNTA, XANAX, AND DO SYMBICORT INHALER AM DOS W/ SIPS OF WATER.  PT IS VERY ADDIMENT THAT SHE GET 50MG  BENADRYL IV AND PHENERGAN IV PRE-OP TO PREVENT ANY REACTION. STATES LAST VISIT AT [facility] [place] 2012, SHE STATES THIS WAS NOT FOLLOWED AND SHE HAD REACTION.

## 2014-05-08 ENCOUNTER — Encounter (HOSPITAL_BASED_OUTPATIENT_CLINIC_OR_DEPARTMENT_OTHER): Payer: Self-pay | Admitting: Anesthesiology

## 2014-05-08 ENCOUNTER — Ambulatory Visit (HOSPITAL_BASED_OUTPATIENT_CLINIC_OR_DEPARTMENT_OTHER)
Admission: RE | Admit: 2014-05-08 | Discharge: 2014-05-08 | Disposition: A | Discharge status: Home / Self Care | Payer: BLUE CROSS/BLUE SHIELD | Source: Ambulatory Visit | Attending: Urology | Admitting: Urology

## 2014-05-08 ENCOUNTER — Ambulatory Visit (HOSPITAL_BASED_OUTPATIENT_CLINIC_OR_DEPARTMENT_OTHER): Payer: BLUE CROSS/BLUE SHIELD | Admitting: Anesthesiology

## 2014-05-08 ENCOUNTER — Encounter (HOSPITAL_BASED_OUTPATIENT_CLINIC_OR_DEPARTMENT_OTHER)
Admission: RE | Disposition: A | Discharge status: Home / Self Care | Payer: Self-pay | Source: Ambulatory Visit | Attending: Urology

## 2014-05-08 DIAGNOSIS — Z79891 Long term (current) use of opiate analgesic: Secondary | ICD-10-CM | POA: Insufficient documentation

## 2014-05-08 DIAGNOSIS — Z87891 Personal history of nicotine dependence: Secondary | ICD-10-CM | POA: Diagnosis not present

## 2014-05-08 DIAGNOSIS — Z79899 Other long term (current) drug therapy: Secondary | ICD-10-CM | POA: Insufficient documentation

## 2014-05-08 DIAGNOSIS — G4730 Sleep apnea, unspecified: Secondary | ICD-10-CM | POA: Diagnosis not present

## 2014-05-08 DIAGNOSIS — N281 Cyst of kidney, acquired: Secondary | ICD-10-CM | POA: Diagnosis not present

## 2014-05-08 DIAGNOSIS — Z9889 Other specified postprocedural states: Secondary | ICD-10-CM | POA: Insufficient documentation

## 2014-05-08 DIAGNOSIS — Z90710 Acquired absence of both cervix and uterus: Secondary | ICD-10-CM | POA: Insufficient documentation

## 2014-05-08 DIAGNOSIS — K589 Irritable bowel syndrome without diarrhea: Secondary | ICD-10-CM | POA: Insufficient documentation

## 2014-05-08 DIAGNOSIS — F419 Anxiety disorder, unspecified: Secondary | ICD-10-CM | POA: Diagnosis not present

## 2014-05-08 DIAGNOSIS — M1990 Unspecified osteoarthritis, unspecified site: Secondary | ICD-10-CM | POA: Insufficient documentation

## 2014-05-08 DIAGNOSIS — N3011 Interstitial cystitis (chronic) with hematuria: Secondary | ICD-10-CM | POA: Diagnosis not present

## 2014-05-08 DIAGNOSIS — J449 Chronic obstructive pulmonary disease, unspecified: Secondary | ICD-10-CM | POA: Insufficient documentation

## 2014-05-08 DIAGNOSIS — J45909 Unspecified asthma, uncomplicated: Secondary | ICD-10-CM | POA: Diagnosis not present

## 2014-05-08 DIAGNOSIS — E039 Hypothyroidism, unspecified: Secondary | ICD-10-CM | POA: Insufficient documentation

## 2014-05-08 DIAGNOSIS — R102 Pelvic and perineal pain: Secondary | ICD-10-CM | POA: Diagnosis not present

## 2014-05-08 DIAGNOSIS — M797 Fibromyalgia: Secondary | ICD-10-CM | POA: Insufficient documentation

## 2014-05-08 DIAGNOSIS — N200 Calculus of kidney: Secondary | ICD-10-CM | POA: Insufficient documentation

## 2014-05-08 DIAGNOSIS — Z9049 Acquired absence of other specified parts of digestive tract: Secondary | ICD-10-CM | POA: Insufficient documentation

## 2014-05-08 DIAGNOSIS — G43909 Migraine, unspecified, not intractable, without status migrainosus: Secondary | ICD-10-CM | POA: Diagnosis not present

## 2014-05-08 DIAGNOSIS — R3989 Other symptoms and signs involving the genitourinary system: Secondary | ICD-10-CM | POA: Diagnosis present

## 2014-05-08 DIAGNOSIS — N3010 Interstitial cystitis (chronic) without hematuria: Secondary | ICD-10-CM

## 2014-05-08 HISTORY — DX: Unspecified asthma, uncomplicated: J45.909

## 2014-05-08 HISTORY — PX: CYSTO WITH HYDRODISTENSION: SHX5453

## 2014-05-08 LAB — POCT HEMOGLOBIN-HEMACUE: HEMOGLOBIN: 13.6 g/dL (ref 12.0–15.0)

## 2014-05-08 SURGERY — CYSTOSCOPY, WITH BLADDER HYDRODISTENSION
Anesthesia: General | Site: Bladder

## 2014-05-08 MED ORDER — MENTHOL 3 MG MT LOZG
LOZENGE | OROMUCOSAL | Status: AC
  Filled 2014-05-08: qty 9

## 2014-05-08 MED ORDER — PROPOFOL 10 MG/ML IV BOLUS
INTRAVENOUS | Status: DC | PRN
  Administered 2014-05-08: 150 mg via INTRAVENOUS

## 2014-05-08 MED ORDER — BELLADONNA ALKALOIDS-OPIUM 16.2-60 MG RE SUPP
RECTAL | Status: AC
  Filled 2014-05-08: qty 1

## 2014-05-08 MED ORDER — STERILE WATER FOR IRRIGATION IR SOLN
Status: DC | PRN
  Administered 2014-05-08: 3000 mL via INTRAVESICAL

## 2014-05-08 MED ORDER — ONDANSETRON HCL 4 MG/2ML IJ SOLN
4.0000 mg | Freq: Once | INTRAMUSCULAR | Status: DC | PRN
  Filled 2014-05-08: qty 2

## 2014-05-08 MED ORDER — TROSPIUM CHLORIDE ER 60 MG PO CP24: 60.0000 mg | ORAL_CAPSULE | Freq: Every day | ORAL | Status: DC

## 2014-05-08 MED ORDER — CIPROFLOXACIN IN D5W 400 MG/200ML IV SOLN
400.0000 mg | INTRAVENOUS | Status: AC
  Administered 2014-05-08: 400 mg via INTRAVENOUS
  Filled 2014-05-08: qty 200

## 2014-05-08 MED ORDER — ACETAMINOPHEN 10 MG/ML IV SOLN
INTRAVENOUS | Status: DC | PRN
  Administered 2014-05-08: 1000 mg via INTRAVENOUS

## 2014-05-08 MED ORDER — HYDROMORPHONE HCL 1 MG/ML IJ SOLN
0.2500 mg | INTRAMUSCULAR | Status: DC | PRN
  Filled 2014-05-08: qty 1

## 2014-05-08 MED ORDER — LIDOCAINE HCL 2 % EX GEL
CUTANEOUS | Status: DC | PRN
  Administered 2014-05-08: 1 via TOPICAL

## 2014-05-08 MED ORDER — DIPHENHYDRAMINE HCL 50 MG/ML IJ SOLN
INTRAMUSCULAR | Status: DC | PRN
  Administered 2014-05-08: 50 mg via INTRAVENOUS

## 2014-05-08 MED ORDER — IOHEXOL 350 MG/ML SOLN
INTRAVENOUS | Status: DC | PRN
  Administered 2014-05-08: 7 mL via URETHRAL

## 2014-05-08 MED ORDER — CIPROFLOXACIN IN D5W 400 MG/200ML IV SOLN
INTRAVENOUS | Status: AC
  Filled 2014-05-08: qty 200

## 2014-05-08 MED ORDER — PHENAZOPYRIDINE HCL 200 MG PO TABS: 200.0000 mg | ORAL_TABLET | Freq: Three times a day (TID) | ORAL | Status: DC | PRN

## 2014-05-08 MED ORDER — KETOROLAC TROMETHAMINE 30 MG/ML IJ SOLN
INTRAMUSCULAR | Status: DC | PRN
  Administered 2014-05-08: 30 mg via INTRAVENOUS

## 2014-05-08 MED ORDER — HYDROCODONE-ACETAMINOPHEN 5-325 MG PO TABS: 1.0000 | ORAL_TABLET | Freq: Four times a day (QID) | ORAL | Status: DC | PRN

## 2014-05-08 MED ORDER — ONDANSETRON HCL 4 MG/2ML IJ SOLN
INTRAMUSCULAR | Status: DC | PRN
  Administered 2014-05-08 (×2): 4 mg via INTRAVENOUS

## 2014-05-08 MED ORDER — LIDOCAINE HCL (CARDIAC) 20 MG/ML IV SOLN
INTRAVENOUS | Status: DC | PRN
Start: 2014-05-08 — End: 2014-05-08
  Administered 2014-05-08: 100 mg via INTRAVENOUS

## 2014-05-08 MED ORDER — BELLADONNA ALKALOIDS-OPIUM 16.2-60 MG RE SUPP
RECTAL | Status: DC | PRN
  Administered 2014-05-08: 1 via RECTAL

## 2014-05-08 MED ORDER — MIDAZOLAM HCL 5 MG/5ML IJ SOLN
INTRAMUSCULAR | Status: DC | PRN
  Administered 2014-05-08: 1 mg via INTRAVENOUS
  Administered 2014-05-08: 2 mg via INTRAVENOUS
  Administered 2014-05-08: 1 mg via INTRAVENOUS

## 2014-05-08 MED ORDER — PHENAZOPYRIDINE HCL 200 MG PO TABS
ORAL | Status: DC | PRN
  Administered 2014-05-08: 15 mL via INTRAVESICAL

## 2014-05-08 MED ORDER — FENTANYL CITRATE 0.05 MG/ML IJ SOLN
INTRAMUSCULAR | Status: DC | PRN
  Administered 2014-05-08 (×3): 12.5 ug via INTRAVENOUS
  Administered 2014-05-08: 50 ug via INTRAVENOUS
  Administered 2014-05-08: 12.5 ug via INTRAVENOUS

## 2014-05-08 MED ORDER — MIDAZOLAM HCL 2 MG/2ML IJ SOLN
INTRAMUSCULAR | Status: AC
  Filled 2014-05-08: qty 2

## 2014-05-08 MED ORDER — LACTATED RINGERS IV SOLN
INTRAVENOUS | Status: DC
  Administered 2014-05-08 (×2): via INTRAVENOUS
  Filled 2014-05-08: qty 1000

## 2014-05-08 MED ORDER — FENTANYL CITRATE 0.05 MG/ML IJ SOLN
INTRAMUSCULAR | Status: AC
  Filled 2014-05-08: qty 4

## 2014-05-08 SURGICAL SUPPLY — 17 items
BAG DRAIN URO-CYSTO SKYTR STRL (DRAIN) ×2 IMPLANT
CANISTER SUCT LVC 12 LTR MEDI- (MISCELLANEOUS) ×2 IMPLANT
CATH SILICONE 14FRX5CC (CATHETERS) ×2 IMPLANT
CATH URET 5FR 28IN OPEN ENDED (CATHETERS) ×2 IMPLANT
CLOTH BEACON ORANGE TIMEOUT ST (SAFETY) ×2 IMPLANT
GLOVE BIO SURGEON STRL SZ7.5 (GLOVE) ×2 IMPLANT
GLOVE BIOGEL PI IND STRL 7.5 (GLOVE) ×1 IMPLANT
GLOVE BIOGEL PI INDICATOR 7.5 (GLOVE) ×1
GLOVE SURG SS PI 7.5 STRL IVOR (GLOVE) ×2 IMPLANT
GOWN STRL REUS W/ TWL LRG LVL3 (GOWN DISPOSABLE) ×1 IMPLANT
GOWN STRL REUS W/TWL LRG LVL3 (GOWN DISPOSABLE) ×1
GOWN STRL REUS W/TWL XL LVL3 (GOWN DISPOSABLE) ×2 IMPLANT
IV CATH 18GX1.25 SAFE RETR GRN (IV SOLUTION) ×2 IMPLANT
PACK CYSTO (CUSTOM PROCEDURE TRAY) ×4 IMPLANT
SYR 20CC LL (SYRINGE) ×2 IMPLANT
SYR 30ML LL (SYRINGE) ×2 IMPLANT
WATER STERILE IRR 3000ML UROMA (IV SOLUTION) ×2 IMPLANT

## 2014-05-08 NOTE — Discharge Instructions (Signed)
CYSTOSCOPY HOME CARE INSTRUCTIONS  Activity: Rest for the remainder of the day.  Do not drive or operate equipment today.  You may resume normal activities in one to two days as instructed by your physician.   Meals: Drink plenty of liquids and eat light foods such as gelatin or soup this evening.  You may return to a normal meal plan tomorrow.  Return to Work: You may return to work in one to two days or as instructed by your physician.  Special Instructions / Symptoms: Call your physician if any of these symptoms occur:   -persistent or heavy bleeding  -bleeding which continues after first few urination  -large blood clots that are difficult to pass  -urine stream diminishes or stops completely  -fever equal to or higher than 101 degrees Farenheit.  -cloudy urine with a strong, foul odor  -severe pain  Females should always wipe from front to back after elimination.  You may feel some burning pain when you urinate.  This should disappear with time.  Applying moist heat to the lower abdomen or a hot tub bath may help relieve the pain.   Post Anesthesia Home Care Instructions  Activity: Get plenty of rest for the remainder of the day. A responsible adult should stay with you for 24 hours following the procedure.  For the next 24 hours, DO NOT: -Drive a car -Paediatric nurse -Drink alcoholic beverages -Take any medication unless instructed by your physician -Make any legal decisions or sign important papers.  Meals: Start with liquid foods such as gelatin or soup. Progress to regular foods as tolerated. Avoid greasy, spicy, heavy foods. If nausea and/or vomiting occur, drink only clear liquids until the nausea and/or vomiting subsides. Call your physician if vomiting continues.  Special Instructions/Symptoms: Your throat may feel dry or sore from the anesthesia or the breathing tube placed in your throat during surgery. If this causes discomfort, gargle with warm salt water.  The discomfort should disappear within 24 hours.

## 2014-05-08 NOTE — Transfer of Care (Signed)
Immediate Anesthesia Transfer of Care Note  Patient: Kari Green  Procedure(s) Performed: Procedure(s) (LRB): CYSTOSCOPY/HYDRODISTENSION WITH BILATERAL RETROGRADES (N/A)  Patient Location: PACU  Anesthesia Type: General  Level of Consciousness: awake, sedated, patient cooperative and responds to stimulation  Airway & Oxygen Therapy: Patient Spontanous Breathing and Patient connected to face mask oxygen  Post-op Assessment: Report given to PACU RN, Post -op Vital signs reviewed and stable and Patient moving all extremities  Post vital signs: Reviewed and stable  Complications: No apparent anesthesia complications

## 2014-05-08 NOTE — Anesthesia Procedure Notes (Signed)
Procedure Name: LMA Insertion Date/Time: 05/08/2014 10:15 AM Performed by: Justice Rocher Pre-anesthesia Checklist: Patient identified, Emergency Drugs available, Suction available and Patient being monitored Patient Re-evaluated:Patient Re-evaluated prior to inductionOxygen Delivery Method: Circle System Utilized Preoxygenation: Pre-oxygenation with 100% oxygen Intubation Type: IV induction Ventilation: Mask ventilation without difficulty LMA: LMA inserted LMA Size: 4.0 Number of attempts: 1 Airway Equipment and Method: Bite block Placement Confirmation: positive ETCO2 Tube secured with: Tape Dental Injury: Teeth and Oropharynx as per pre-operative assessment

## 2014-05-08 NOTE — Op Note (Addendum)
Preoperative diagnosis:  1. Interstitial cystitis  2. Microscopic hematuria  Postoperative diagnosis:  1. Same   Procedure: 1. Cystoscopy 2. Bilateral retrograde pyelogram with interpretation 3. Hydrodistention with instillation of Marcaine/pyridium at the end of the case.  Surgeon: Ardis Hughs, MD  Anesthesia: General  Complications: None  Intraoperative findings: #1 3 mL of Omnipaque contrast were instilled into the right ureteral orifice and the entire right ureter and renal pelvis was opacified with no significant abnormality noted. Ureteral caliber was normal appearing the calyces were sharp. 3 mL's of Omnipaque contrast were also injected into the left ureteral orifice following the right retrograde. Again, the left ureter had normal caliber and the calyces were sharply defined without any filling defects. #2 the patient's maximum bladder capacity was 600 mL. The patient's bladder did have some fibrotic changes, there were no Hunner ulcers as were noted on previous hydrodistention's. The bladder was vascular although no petechial hemorrhage was noted. #3 40 mL of Marcaine/parity M were instilled into the patient's bladder in a 50-50 mix.  EBL: Minimal  Specimens: None  Indication: Kari Green is a 49 y.o. patient with a history of painful bladder syndrome and microscopic hematuria.  After reviewing the management options for treatment, he elected to proceed with the above surgical procedure(s). We have discussed the potential benefits and risks of the procedure, side effects of the proposed treatment, the likelihood of the patient achieving the goals of the procedure, and any potential problems that might occur during the procedure or recuperation. Informed consent has been obtained.  Description of procedure:  The patient was taken to the operating room and general anesthesia was induced.  The patient was placed in the dorsal lithotomy position, prepped and draped  in the usual sterile fashion, and preoperative antibiotics were administered. A preoperative time-out was performed.   A 30 34 French cystoscope was gently passed through the patient's urethra and into the bladder. A 360 cystoscopic evaluation was performed with the above findings. There were no abnormalities within the bladder. The ureteral orifices were orthotopic. Using a 5 [place] open-ended ureteral catheter a ridge grade pyelogram was performed after cannulating the right ureteral orifice. The above findings were noted. I then repeated the retrograde pyelogram in a similar fashion on the left with the above findings. I then emptied the bladder and hanging in the normal saline at 80 cm above the patient's bladder we instilled irrigant until the irrigant stopped flowing. At this point we drained the bladder and got 550 mL's. We then filled up the bladder again until the irrigant stopped flowing and held it for 5 minutes. We then drained this noting 600 mL.   Once the bladder was drained a 14 [place] nonlatex catheter was inserted into the patient's urethra and into bladder and the bladder instillation was performed as noted above.  A B and O suppository was then placed into the patient's rectum. 10 mL of Urojet (lidocaine jelly) was instilled into the patient's urethra. The patient was subsequent x-ray returned back in excellent condition.  Ardis Hughs, M.D.

## 2014-05-08 NOTE — Anesthesia Postprocedure Evaluation (Signed)
  Anesthesia Post-op Note  Patient: Kari Green  Procedure(s) Performed: Procedure(s): CYSTOSCOPY/HYDRODISTENSION WITH BILATERAL RETROGRADES (N/A)  Patient Location: PACU  Anesthesia Type:General  Level of Consciousness: awake, alert , oriented and patient cooperative  Airway and Oxygen Therapy: Patient Spontanous Breathing  Post-op Pain: mild  Post-op Assessment: Post-op Vital signs reviewed, Patient's Cardiovascular Status Stable, Respiratory Function Stable, Patent Airway, No signs of Nausea or vomiting and Pain level controlled  Post-op Vital Signs: stable  Last Vitals:  Filed Vitals:   05/08/14 1100  BP: 120/77  Pulse: 83  Temp:   Resp: 12    Complications: No apparent anesthesia complications

## 2014-05-08 NOTE — H&P (Signed)
Reason For Visit Kari Green is a 49 year old former patient of Dr. Terance Hart and Karsten Ro who presents today with complaints of bladder pain.   History of Present Illness nterstitial cystitis: She has a history of IC by clinical criteria. She did undergo cystoscopy with bladder biopsy in 2/05 which revealed no abnormality on pathology however after hydrodistention to 800 cc. She did have petechial hemorrhages noted. She noted marked and significant improvement of her urinary symptoms after undergoing her hydrodistention however.    Right renal cyst. She had possible hematuria and in 1/10 underwent a CT scan which revealed no abnormality of the upper tract. A noncontrasted CT scan in 6/10 revealed bilateral, lower pole, nonobstructing 1 mm stones.     Interval history: She has returned today to discuss hydrodistention which she had had numerous times and thinks its time again. She describes ongoing bladder pain that has progressively gotten worse. Her pain is mostly with a full bladder. She notes frequency, dysuria, straining to void and hestiancy. Urine cultures have been negative. She also has had some intermittent gross hematuria. Her hematuria has not been associated with flank pain. She has a history of hematuria and negative work-ups. She also has a history of stones, which she hasnt passed any recently.   Past Medical History Problems  1. History of Anxiety (F41.9) 2. History of arthritis (Z87.39) 3. History of asthma (Z87.09) 4. History of fibromyalgia (Z87.39) 5. History of hiatal hernia (Z87.19) 6. History of hypercholesterolemia (Z86.39) 7. History of hypothyroidism (Z86.39) 8. History of migraine headaches (Z86.69) 9. History of sleep apnea (Z87.09)  Surgical History Problems  1. History of Cholecystectomy 2. History of Cystoscopy With Biopsy 3. History of Cystoscopy With Dilation Of Bladder 4. History of Knee Replacement 5. History of Thyroid Surgery 6. History of  Tonsillectomy 7. History of Total Abdominal Hysterectomy  Current Meds 1. ALPRAZolam 0.5 MG Oral Tablet;  Therapy: 23Jul2012 to Recorded 2. Estradiol 0.5 MG Oral Tablet;  Therapy: [id number] to Recorded 3. HydrOXYzine HCl - 25 MG Oral Tablet;  Therapy: 06Aug2012 to Recorded 4. Nucynta 100 MG Oral Tablet;  Therapy: (Recorded:23Feb2016) to Recorded 5. Pantoprazole Sodium 40 MG Oral Tablet Delayed Release;  Therapy: 26Jun2012 to Recorded 6. Synthroid 100 MCG Oral Tablet (Levothyroxine Sodium);  Therapy: (Recorded:23Feb2016) to Recorded 7. Topamax 100 MG Oral Tablet (Topiramate);  Therapy: (Recorded:23Feb2016) to Recorded 8. Vitamin B12 TABS;  Therapy: (Recorded:23Feb2016) to Recorded  Allergies Medication  1. Codeine Derivatives 2. Crestor TABS 3. Darvocet-N 100 TABS 4. Red Dye 5. Urelle TABS Non-Medication  6. Adhesive Tape 7. Contrast Dye 8. Food Dye 9. Latex  Family History Problems  1. Family history of Cancer 2. Family history of leukemia (Z80.6) : Mother 3. Family history of prostate cancer (Z80.42) : Uncle 4. Family history of renal failure (Z84.1) : Aunt 5. Family history of Heart Disease 6. Family history of Nephrolithiasis  Social History Problems    Denied: History of Alcohol use   Caffeine use (F15.90)   2   Disabled   Former smoker [phone])   1/2 ppd for 6 years and quit in 2007   Marital History - Currently Married   Never a smoker   Number of children   1 daughter   History of Tobacco Use   Smoked 1/2 pack per day for 6 yrs and quit 1 yr ago  Review of Systems  Genitourinary: urinary frequency, dysuria, nocturia, urinary hesitancy, hematuria, foul smelling urine and initiating urination requires straining.  Gastrointestinal: nausea, vomiting, diarrhea and  constipation.  Constitutional: fever and feeling tired (fatigue).  ENT: sinus problems.  Hematologic/Lymphatic: a tendency to easily bruise and swollen glands.  Respiratory:  cough.  Musculoskeletal: back pain and joint pain.  Neurological: headache.  Psychiatric: anxiety.    Vitals Vital Signs [Data Includes: Last 1 Day]  Recorded: 23Feb2016 03:10PM  Weight: 127 lb  BMI Calculated: 22.14 BSA Calculated: 1.6 Blood Pressure: 115 / 81 Temperature: 97.7 F Heart Rate: 76  Physical Exam Constitutional: Well nourished and well developed . No acute distress.  ENT:. The ears and nose are normal in appearance.  Neck: The appearance of the neck is normal and no neck mass is present.  Pulmonary: No respiratory distress and normal respiratory rhythm and effort.  Cardiovascular: Heart rate and rhythm are normal . No peripheral edema.  Abdomen: The abdomen is soft and nontender. No masses are palpated. No CVA tenderness. No hernias are palpable. No hepatosplenomegaly noted.  Lymphatics: The femoral and inguinal nodes are not enlarged or tender.  Skin: Normal skin turgor, no visible rash and no visible skin lesions.  Neuro/Psych:. Mood and affect are appropriate.    Results/Data Urine [Data Includes: Last 1 Day]   [id number]  COLOR YELLOW   APPEARANCE CLEAR   SPECIFIC GRAVITY 1.020   pH 5.5   GLUCOSE NEG mg/dL  BILIRUBIN NEG   KETONE NEG mg/dL  BLOOD TRACE   PROTEIN NEG mg/dL  UROBILINOGEN 0.2 mg/dL  NITRITE NEG   LEUKOCYTE ESTERASE NEG   SQUAMOUS EPITHELIAL/HPF FEW   WBC 0-2 WBC/hpf  RBC 0-2 RBC/hpf  BACTERIA RARE   CRYSTALS NONE SEEN   CASTS NONE SEEN        UA clean today  KUB - obtained looking for stones within her ureters. The renal shadows are present bilaterally, there are no calcifications within the ureters or renal pelvis bilaterally. She has pelvic phlebolith that are not in the proper location to be within the urinary tract.   Plan   Bilateral kidney stones  1. KUB; Status:Resulted - Requires Verification;   Done: [id number] 12:00AM Chronic interstitial cystitis without hematuria, Gross hematuria  2. Follow-up Schedule Surgery  Office  Follow-up  Status: Hold For - Appointment   Requested for: 23Feb2016 Health Maintenance  3. UA With REFLEX; [Do Not Release]; Status:Complete;   Done: [id number] 02:18PM  I discussed treatment of pelvic pain and voiding symptoms with the patient and recommended that we start with vaginal valium and physical therapy. The patient has refused these treatments and instead has requested that we proceed to the OR for hydrodistention. I think this would be reasonable because it would allow [place] to perform retrograde pyelograms as well to ensure that she has no other cause of her hematuria. We discussed the procedure in detail, which she is already familiar with. She is eager to proceed.

## 2014-05-08 NOTE — Anesthesia Preprocedure Evaluation (Addendum)
Anesthesia Evaluation  Patient identified by MRN, date of birth, ID band Patient awake    Reviewed: Allergy & Precautions, NPO status , Patient's Chart, lab work & pertinent test results  History of Anesthesia Complications (+) PONV  Airway        Dental   Pulmonary asthma , sleep apnea , COPD         Cardiovascular     Neuro/Psych  Headaches,  Neuromuscular disease    GI/Hepatic hiatal hernia,   Endo/Other  Hypothyroidism   Renal/GU Renal disease     Musculoskeletal  (+) Arthritis -, Fibromyalgia -  Abdominal   Peds  Hematology   Anesthesia Other Findings IBS  Reproductive/Obstetrics                             Anesthesia Physical Anesthesia Plan  ASA: II  Anesthesia Plan: General   Post-op Pain Management:    Induction: Intravenous  Airway Management Planned: LMA  Additional Equipment:   Intra-op Plan:   Post-operative Plan: Extubation in OR  Informed Consent: I have reviewed the patients History and Physical, chart, labs and discussed the procedure including the risks, benefits and alternatives for the proposed anesthesia with the patient or authorized representative who has indicated his/her understanding and acceptance.     Plan Discussed with: CRNA, Anesthesiologist and Surgeon  Anesthesia Plan Comments:         Anesthesia Quick Evaluation

## 2014-05-08 NOTE — Interval H&P Note (Signed)
History and Physical Interval Note:  05/08/2014 9:57 AM  Kari Green  has presented today for surgery, with the diagnosis of PELVIC PAIN   The various methods of treatment have been discussed with the patient and family. After consideration of risks, benefits and other options for treatment, the patient has consented to  Procedure(s): CYSTOSCOPY/HYDRODISTENSION WITH BILATERAL RETROGRADES (N/A) as a surgical intervention .  The patient's history has been reviewed, patient examined, no change in status, stable for surgery.  I have reviewed the patient's chart and labs.  Questions were answered to the patient's satisfaction.     Louis Meckel W

## 2014-05-09 ENCOUNTER — Encounter (HOSPITAL_BASED_OUTPATIENT_CLINIC_OR_DEPARTMENT_OTHER): Payer: Self-pay | Admitting: Urology

## 2014-05-30 ENCOUNTER — Telehealth: Payer: Self-pay | Admitting: *Deleted

## 2014-06-10 ENCOUNTER — Telehealth: Payer: Self-pay | Admitting: *Deleted

## 2014-06-10 NOTE — Telephone Encounter (Signed)
Patient following up on her Botox pleas call Call back number [phone]

## 2014-06-10 NOTE — Telephone Encounter (Signed)
I called patient to let her know that  Prior Auth was sent in last week and has not came back to [place] yet . I will call her as soon as I hear back from the insurance

## 2014-06-10 NOTE — Telephone Encounter (Signed)
Error

## 2014-06-18 ENCOUNTER — Telehealth: Payer: Self-pay | Admitting: *Deleted

## 2014-06-18 NOTE — Telephone Encounter (Signed)
I left message for patient that the final approval for Botox came today @11 :53am  I have made appt  For 06/26/14 at 8:45am

## 2014-06-26 ENCOUNTER — Ambulatory Visit: Payer: Self-pay | Admitting: Neurology

## 2014-06-28 ENCOUNTER — Ambulatory Visit (INDEPENDENT_AMBULATORY_CARE_PROVIDER_SITE_OTHER): Payer: BLUE CROSS/BLUE SHIELD | Admitting: Neurology

## 2014-06-28 ENCOUNTER — Other Ambulatory Visit: Payer: Self-pay | Admitting: *Deleted

## 2014-06-28 ENCOUNTER — Other Ambulatory Visit: Payer: Self-pay

## 2014-06-28 VITALS — BP 128/90 | HR 70 | Resp 20 | Wt 135.6 lb

## 2014-06-28 DIAGNOSIS — J309 Allergic rhinitis, unspecified: Secondary | ICD-10-CM | POA: Diagnosis not present

## 2014-06-28 DIAGNOSIS — G43009 Migraine without aura, not intractable, without status migrainosus: Secondary | ICD-10-CM | POA: Diagnosis not present

## 2014-06-28 DIAGNOSIS — Z1231 Encounter for screening mammogram for malignant neoplasm of breast: Secondary | ICD-10-CM

## 2014-06-28 MED ORDER — ONABOTULINUMTOXINA 100 UNITS IJ SOLR
200.0000 [IU] | Freq: Once | INTRAMUSCULAR | Status: AC
  Administered 2014-06-28: 200 [IU] via INTRAMUSCULAR

## 2014-06-28 NOTE — Procedures (Signed)
BOTOX PROCEDURE NOTE  Risks and benefits were discussed with the patient, who appears to understand the discussion and written and verbal consent were obtained.  200 units were reconstituted with 4 mL 0.9% sodium chloride.  Skin was cleaned with alcohol prior to injection.  A 30-gauge, 0.5-inch needle was used.  Total:  4 mL (200 units)  # of injection sites Muscle   Dose Total 1     Corrugator   0.1 mL (5 units) 1    Procerus   0.2 mL (10 units) 2 each side  Frontalis  0.4 mL (20 units) 4 each side  Temporalis  0.8 mL (40 units) 3 each side  Occipitalis  0.6 mL (30 units) 2 each side  Cervical paraspinals 0.4 mL (20 units) 3 each side  Trapezius  0.6 mL (30 units)    Total 31  Total used  155 units    Total wasted  45 units  The patient tolerated the procedure.   Metta Clines, DO

## 2014-07-16 DIAGNOSIS — Z01419 Encounter for gynecological examination (general) (routine) without abnormal findings: Secondary | ICD-10-CM | POA: Diagnosis not present

## 2014-07-16 DIAGNOSIS — Z1389 Encounter for screening for other disorder: Secondary | ICD-10-CM | POA: Diagnosis not present

## 2014-07-16 DIAGNOSIS — Z130 Encounter for screening for diseases of the blood and blood-forming organs and certain disorders involving the immune mechanism: Secondary | ICD-10-CM | POA: Diagnosis not present

## 2014-07-16 DIAGNOSIS — Z79890 Hormone replacement therapy (postmenopausal): Secondary | ICD-10-CM | POA: Diagnosis not present

## 2014-07-18 ENCOUNTER — Ambulatory Visit
Admission: RE | Admit: 2014-07-18 | Discharge: 2014-07-18 | Disposition: A | Discharge status: Home / Self Care | Payer: BLUE CROSS/BLUE SHIELD | Source: Ambulatory Visit

## 2014-07-18 DIAGNOSIS — Z1231 Encounter for screening mammogram for malignant neoplasm of breast: Secondary | ICD-10-CM

## 2014-07-25 ENCOUNTER — Other Ambulatory Visit: Payer: Self-pay | Admitting: Internal Medicine

## 2014-07-25 DIAGNOSIS — R928 Other abnormal and inconclusive findings on diagnostic imaging of breast: Secondary | ICD-10-CM

## 2014-08-09 ENCOUNTER — Other Ambulatory Visit: Payer: Self-pay | Admitting: Internal Medicine

## 2014-08-09 ENCOUNTER — Ambulatory Visit
Admission: RE | Admit: 2014-08-09 | Discharge: 2014-08-09 | Disposition: A | Discharge status: Home / Self Care | Payer: BLUE CROSS/BLUE SHIELD | Source: Ambulatory Visit | Attending: Internal Medicine | Admitting: Internal Medicine

## 2014-08-09 DIAGNOSIS — R928 Other abnormal and inconclusive findings on diagnostic imaging of breast: Secondary | ICD-10-CM

## 2014-09-11 ENCOUNTER — Ambulatory Visit: Payer: Self-pay | Admitting: Neurology

## 2014-09-18 ENCOUNTER — Emergency Department (HOSPITAL_BASED_OUTPATIENT_CLINIC_OR_DEPARTMENT_OTHER)
Admission: EM | Admit: 2014-09-18 | Discharge: 2014-09-19 | Disposition: A | Discharge status: Home / Self Care | Payer: BLUE CROSS/BLUE SHIELD | Attending: Emergency Medicine | Admitting: Emergency Medicine

## 2014-09-18 ENCOUNTER — Encounter (HOSPITAL_BASED_OUTPATIENT_CLINIC_OR_DEPARTMENT_OTHER): Payer: Self-pay | Admitting: *Deleted

## 2014-09-18 DIAGNOSIS — Z87448 Personal history of other diseases of urinary system: Secondary | ICD-10-CM | POA: Insufficient documentation

## 2014-09-18 DIAGNOSIS — J45909 Unspecified asthma, uncomplicated: Secondary | ICD-10-CM | POA: Insufficient documentation

## 2014-09-18 DIAGNOSIS — Z85828 Personal history of other malignant neoplasm of skin: Secondary | ICD-10-CM | POA: Diagnosis not present

## 2014-09-18 DIAGNOSIS — Z8719 Personal history of other diseases of the digestive system: Secondary | ICD-10-CM | POA: Diagnosis not present

## 2014-09-18 DIAGNOSIS — L299 Pruritus, unspecified: Secondary | ICD-10-CM | POA: Diagnosis not present

## 2014-09-18 DIAGNOSIS — E039 Hypothyroidism, unspecified: Secondary | ICD-10-CM | POA: Diagnosis not present

## 2014-09-18 DIAGNOSIS — M1990 Unspecified osteoarthritis, unspecified site: Secondary | ICD-10-CM | POA: Diagnosis not present

## 2014-09-18 DIAGNOSIS — F419 Anxiety disorder, unspecified: Secondary | ICD-10-CM | POA: Insufficient documentation

## 2014-09-18 DIAGNOSIS — Z91040 Latex allergy status: Secondary | ICD-10-CM | POA: Diagnosis not present

## 2014-09-18 DIAGNOSIS — Z8669 Personal history of other diseases of the nervous system and sense organs: Secondary | ICD-10-CM | POA: Insufficient documentation

## 2014-09-18 DIAGNOSIS — Z79899 Other long term (current) drug therapy: Secondary | ICD-10-CM | POA: Insufficient documentation

## 2014-09-18 NOTE — ED Notes (Signed)
Pt recently began drinking bottled tea and developed generalized itching. Pt is alternating benadryl and hydroxizine without relief. Pt has also had no relief from depo medrol shots or aveeno baths.

## 2014-09-18 NOTE — ED Notes (Signed)
Patient reports "itching" all over for the past 2 weeks.  Reports she started drinking green tea drinks 2 weeks ago.  States she called PCP today who told her that she was allergic to this.  Patient states she has been taking 50mg  Benadryl as well as atarax without relief. Reports nausea-states "I have been taking phenergan for this and it hasn't helped".  Reports itching is worse tonight and this is what brings her to ER.  During assessment no itching noted.

## 2014-09-19 MED ORDER — PROMETHAZINE HCL 25 MG/ML IJ SOLN
25.0000 mg | Freq: Once | INTRAMUSCULAR | Status: AC
  Administered 2014-09-19: 25 mg via INTRAMUSCULAR
  Filled 2014-09-19: qty 1

## 2014-09-19 MED ORDER — EPINEPHRINE HCL 1 MG/ML IJ SOLN
0.3000 mg | Freq: Once | INTRAMUSCULAR | Status: AC
  Administered 2014-09-19: 0.3 mg via SUBCUTANEOUS
  Filled 2014-09-19: qty 1

## 2014-09-19 MED ORDER — DIPHENHYDRAMINE HCL 50 MG/ML IJ SOLN
50.0000 mg | Freq: Once | INTRAMUSCULAR | Status: AC
  Administered 2014-09-19: 50 mg via INTRAMUSCULAR
  Filled 2014-09-19: qty 1
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
## 2014-09-19 NOTE — ED Notes (Addendum)
Assumed care of patient from [place], [place]. Pt lying on stretcher resting quietly - in no acute distress, VSS, call bell in reach. Family at side. Pt reports relief of symptoms (itching, throat swelling) after medications given. Pt reports she developed itchy rash after 2 weeks of drinking green tea and attributing her symptoms to that.

## 2014-09-19 NOTE — Discharge Instructions (Signed)
Continue your hydroxyzine as before.  Follow-up with your primary Dr. if not improving in the next few days.   Pruritus  Pruritus is an itch. There are many different problems that can cause an itch. Dry skin is one of the most common causes of itching. Most cases of itching do not require medical attention.  HOME CARE INSTRUCTIONS  Make sure your skin is moistened on a regular basis. A moisturizer that contains petroleum jelly is best for keeping moisture in your skin. If you develop a rash, you may try the following for relief:   Use corticosteroid cream.  Apply cool compresses to the affected areas.  Bathe with Epsom salts or baking soda in the bathwater.  Soak in colloidal oatmeal baths. These are available at your pharmacy.  Apply baking soda paste to the rash. Stir water into baking soda until it reaches a paste-like consistency.  Use an anti-itch lotion.  Take over-the-counter diphenhydramine medicine by mouth as the instructions direct.  Avoid scratching. Scratching may cause the rash to become infected. If itching is very bad, your caregiver may suggest prescription lotions or creams to lessen your symptoms.  Avoid hot showers, which can make itching worse. A cold shower may help with itching as long as you use a moisturizer after the shower. SEEK MEDICAL CARE IF: The itching does not go away after several days. Document Released: 10/21/2010 Document Revised: 06/25/2013 Document Reviewed: 10/21/2010 [facility] Patient Information 2015 [place], [place]. This information is not intended to replace advice given to you by your health care provider. Make sure you discuss any questions you have with your health care provider.

## 2014-09-24 ENCOUNTER — Other Ambulatory Visit: Payer: Self-pay | Admitting: Gastroenterology

## 2014-09-30 ENCOUNTER — Ambulatory Visit (INDEPENDENT_AMBULATORY_CARE_PROVIDER_SITE_OTHER): Payer: BLUE CROSS/BLUE SHIELD | Admitting: Neurology

## 2014-09-30 DIAGNOSIS — G43719 Chronic migraine without aura, intractable, without status migrainosus: Secondary | ICD-10-CM

## 2014-09-30 MED ORDER — ONABOTULINUMTOXINA 100 UNITS IJ SOLR
200.0000 [IU] | Freq: Once | INTRAMUSCULAR | Status: AC
  Administered 2014-09-30: 200 [IU] via INTRADERMAL

## 2014-09-30 NOTE — Procedures (Signed)
BOTOX PROCEDURE NOTE  Risks and benefits were discussed with the patient, who appears to understand the discussion and verbal consent was obtained.  200 units were reconstituted with 4 mL 0.9% sodium chloride.  Skin was cleaned with alcohol prior to injection.  A 30-gauge, 0.5-inch needle was used.  Total:  4 mL (200 units)  # of injection sites Muscle   Dose Total 1 each side  Corrugator   0.2 mL (10 units) 1    Procerus   0.2 mL (10 units) 2 each side  Frontalis  0.4 mL (20 units) 4 each side  Temporalis  0.8 mL (40 units) 3 each side  Occipitalis  0.6 mL (30 units) 2 each side  Cervical paraspinals 0.4 mL (20 units) 3 each side  Trapezius  0.6 mL (30 units)    Total 31  Total used  160 units    Total wasted  40 units  The patient tolerated the procedure.

## 2014-09-30 NOTE — Progress Notes (Signed)
See procedure note.

## 2014-10-16 ENCOUNTER — Ambulatory Visit: Payer: Self-pay | Admitting: Neurology

## 2014-11-01 ENCOUNTER — Ambulatory Visit (INDEPENDENT_AMBULATORY_CARE_PROVIDER_SITE_OTHER): Payer: BLUE CROSS/BLUE SHIELD | Admitting: Neurology

## 2014-11-01 DIAGNOSIS — R51 Headache: Secondary | ICD-10-CM

## 2014-11-04 NOTE — Progress Notes (Signed)
No charge. 

## 2014-11-15 DIAGNOSIS — J309 Allergic rhinitis, unspecified: Secondary | ICD-10-CM | POA: Insufficient documentation

## 2014-11-15 DIAGNOSIS — J4540 Moderate persistent asthma, uncomplicated: Secondary | ICD-10-CM | POA: Insufficient documentation

## 2014-11-19 ENCOUNTER — Ambulatory Visit (INDEPENDENT_AMBULATORY_CARE_PROVIDER_SITE_OTHER): Payer: BLUE CROSS/BLUE SHIELD | Admitting: *Deleted

## 2014-11-19 DIAGNOSIS — J309 Allergic rhinitis, unspecified: Secondary | ICD-10-CM

## 2014-12-09 ENCOUNTER — Ambulatory Visit (INDEPENDENT_AMBULATORY_CARE_PROVIDER_SITE_OTHER): Payer: BLUE CROSS/BLUE SHIELD | Admitting: Neurology

## 2014-12-09 ENCOUNTER — Encounter: Payer: Self-pay | Admitting: Pediatrics

## 2014-12-09 ENCOUNTER — Ambulatory Visit (INDEPENDENT_AMBULATORY_CARE_PROVIDER_SITE_OTHER): Payer: BLUE CROSS/BLUE SHIELD | Admitting: Pediatrics

## 2014-12-09 VITALS — BP 130/80 | HR 110 | Resp 12

## 2014-12-09 DIAGNOSIS — J309 Allergic rhinitis, unspecified: Secondary | ICD-10-CM

## 2014-12-09 DIAGNOSIS — J301 Allergic rhinitis due to pollen: Secondary | ICD-10-CM

## 2014-12-09 DIAGNOSIS — J4540 Moderate persistent asthma, uncomplicated: Secondary | ICD-10-CM

## 2014-12-09 DIAGNOSIS — J45909 Unspecified asthma, uncomplicated: Secondary | ICD-10-CM | POA: Diagnosis not present

## 2014-12-09 MED ORDER — ALBUTEROL SULFATE HFA 108 (90 BASE) MCG/ACT IN AERS: 2.0000 | INHALATION_SPRAY | Freq: Four times a day (QID) | RESPIRATORY_TRACT | Status: DC | PRN

## 2014-12-09 MED ORDER — BUDESONIDE-FORMOTEROL FUMARATE 160-4.5 MCG/ACT IN AERO: 2.0000 | INHALATION_SPRAY | Freq: Two times a day (BID) | RESPIRATORY_TRACT | Status: DC

## 2014-12-09 MED ORDER — ALBUTEROL SULFATE (2.5 MG/3ML) 0.083% IN NEBU: 2.5000 mg | INHALATION_SOLUTION | RESPIRATORY_TRACT | Status: DC | PRN

## 2014-12-09 MED ORDER — MONTELUKAST SODIUM 10 MG PO TABS: 10.0000 mg | ORAL_TABLET | Freq: Every day | ORAL | Status: DC

## 2014-12-09 MED ORDER — FLUTICASONE PROPIONATE 50 MCG/ACT NA SUSP: 2.0000 | Freq: Every day | NASAL | Status: DC

## 2014-12-09 MED ORDER — EPINEPHRINE 0.3 MG/0.3ML IJ SOAJ: 0.3000 mg | INTRAMUSCULAR | Status: DC | PRN
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
## 2014-12-09 NOTE — Patient Instructions (Addendum)
Symbicort 160-2 puffs twice a day Montelukast  10 mg once a day Pro-air 2 puffs every 4 hours if needed for wheezing or coughing spells. Fluticasone 2 sprays per nostril once a day Continue on allergy injections every 4 weeks Albuterol 0.083% one unit dose every 4 hours if needed for coughing or wheezing She should have a flu vaccination Continue on her allergy injections   For her allergic reaction to her knee implant, she will try cetirizine 10 mg twice a day and ranitidine 10 mg twice a day.Hopefully with the addition of montelukast she will improve also

## 2014-12-11 ENCOUNTER — Telehealth: Payer: Self-pay | Admitting: Pediatrics

## 2014-12-11 NOTE — Telephone Encounter (Addendum)
Patient called back. Per patient, she has tried and failed [place]. Patient states she had a reaction of itching to [facility], which was given to her in the hospital over a year ago. She was given [facility] by her old PCP, who has since retired.   However, there is no documentation in the paper chart that she has tried those inhalers.  Patient is willing to try another inhaler.  What would you like me to do for the patient? Please advise. Would you like me to complete the PA for Symbicort?

## 2014-12-11 NOTE — Telephone Encounter (Signed)
Left voicemail for patient to call back with information about if she has ever tried [place] or [place]. She needs to have tried and failed both in order to have a PA completed for Symbicort.

## 2014-12-11 NOTE — Telephone Encounter (Deleted)
Dr. Shaune Leeks, what would you like me to do for the patient?

## 2014-12-12 NOTE — Telephone Encounter (Signed)
Do PA for Symbicort 160 - 2 puffs twice a day

## 2014-12-13 NOTE — Telephone Encounter (Signed)
PA completed online for Symbicort 160 today.  Awaiting response

## 2014-12-16 ENCOUNTER — Ambulatory Visit (INDEPENDENT_AMBULATORY_CARE_PROVIDER_SITE_OTHER): Payer: BLUE CROSS/BLUE SHIELD | Admitting: Neurology

## 2014-12-16 DIAGNOSIS — G43009 Migraine without aura, not intractable, without status migrainosus: Secondary | ICD-10-CM

## 2014-12-16 MED ORDER — ONABOTULINUMTOXINA 100 UNITS IJ SOLR
100.0000 [IU] | Freq: Once | INTRAMUSCULAR | Status: AC
  Administered 2014-12-16: 100 [IU] via INTRAMUSCULAR

## 2014-12-16 NOTE — Progress Notes (Signed)
See procedure note.

## 2014-12-16 NOTE — Procedures (Signed)
BOTOX PROCEDURE NOTE  Risks and benefits were discussed with the patient, who appears to understand the discussion and verbal consent was obtained.  200 units were reconstituted with 4 mL 0.9% sodium chloride.  Skin was cleaned with alcohol prior to injection.  A 30-gauge, 0.5-inch needle was used.  Total:  4 mL (200 units)  # of injection sites Muscle   Dose Total 1 each side  Corrugator   0.2 mL (10 units) 1    Procerus   0.1 mL (5 units) 2 each side  Frontalis  0.4 mL (20 units) 4 each side  Temporalis  0.8 mL (40 units) 3 each side  Occipitalis  0.6 mL (30 units) 2 each side  Cervical paraspinals 0.4 mL (20 units) 3 each side  Trapezius  0.6 mL (30 units)    Total 31  Total used  155 units    Total wasted  45 units  The patient tolerated the procedure.    Jaquari Reckner R. Chance Munter, DO 

## 2014-12-17 NOTE — Telephone Encounter (Signed)
DR Shaune Leeks PA FOR SYMBICORT DENIED DUE TO PT HAS NO TRIAL OF ADVAIR ONLY FORMULARY RX TRIED [place], [employer] REQ TRIAL OF BOTH

## 2014-12-18 ENCOUNTER — Other Ambulatory Visit: Payer: Self-pay | Admitting: *Deleted

## 2014-12-18 MED ORDER — FLUTICASONE-SALMETEROL 230-21 MCG/ACT IN AERO: 2.0000 | INHALATION_SPRAY | Freq: Two times a day (BID) | RESPIRATORY_TRACT | Status: DC

## 2014-12-18 NOTE — Telephone Encounter (Signed)
Called patient advised of rx changed. Rx sent to [facility]

## 2014-12-18 NOTE — Addendum Note (Signed)
Addended by: Angelica Ran on: 12/18/2014 09:39 AM   Modules accepted: Orders

## 2014-12-18 NOTE — Telephone Encounter (Signed)
Send in Advair 230/21- use 2 puffs twice a day to prevent cough or wheeze

## 2014-12-30 ENCOUNTER — Other Ambulatory Visit: Payer: Self-pay | Admitting: Orthopedic Surgery

## 2015-01-09 ENCOUNTER — Ambulatory Visit (INDEPENDENT_AMBULATORY_CARE_PROVIDER_SITE_OTHER): Payer: BLUE CROSS/BLUE SHIELD

## 2015-01-09 DIAGNOSIS — J309 Allergic rhinitis, unspecified: Secondary | ICD-10-CM

## 2015-01-13 ENCOUNTER — Ambulatory Visit: Payer: Self-pay | Admitting: Pain Medicine

## 2015-01-13 ENCOUNTER — Encounter: Payer: Self-pay | Admitting: Pain Medicine

## 2015-01-13 DIAGNOSIS — F419 Anxiety disorder, unspecified: Secondary | ICD-10-CM | POA: Insufficient documentation

## 2015-01-13 DIAGNOSIS — M545 Low back pain, unspecified: Secondary | ICD-10-CM | POA: Insufficient documentation

## 2015-01-13 DIAGNOSIS — Z96651 Presence of right artificial knee joint: Secondary | ICD-10-CM | POA: Insufficient documentation

## 2015-01-13 DIAGNOSIS — F411 Generalized anxiety disorder: Secondary | ICD-10-CM | POA: Insufficient documentation

## 2015-01-13 DIAGNOSIS — Z91040 Latex allergy status: Secondary | ICD-10-CM | POA: Insufficient documentation

## 2015-01-13 DIAGNOSIS — Z889 Allergy status to unspecified drugs, medicaments and biological substances status: Secondary | ICD-10-CM | POA: Insufficient documentation

## 2015-01-13 DIAGNOSIS — G894 Chronic pain syndrome: Secondary | ICD-10-CM | POA: Insufficient documentation

## 2015-01-13 DIAGNOSIS — G8929 Other chronic pain: Secondary | ICD-10-CM | POA: Insufficient documentation

## 2015-01-13 DIAGNOSIS — M25512 Pain in left shoulder: Secondary | ICD-10-CM

## 2015-01-13 DIAGNOSIS — M79604 Pain in right leg: Secondary | ICD-10-CM

## 2015-01-13 DIAGNOSIS — M25561 Pain in right knee: Secondary | ICD-10-CM

## 2015-01-13 DIAGNOSIS — F1190 Opioid use, unspecified, uncomplicated: Secondary | ICD-10-CM | POA: Insufficient documentation

## 2015-01-13 DIAGNOSIS — Z79891 Long term (current) use of opiate analgesic: Secondary | ICD-10-CM | POA: Insufficient documentation

## 2015-01-13 DIAGNOSIS — G90521 Complex regional pain syndrome I of right lower limb: Secondary | ICD-10-CM | POA: Insufficient documentation

## 2015-01-14 ENCOUNTER — Ambulatory Visit (INDEPENDENT_AMBULATORY_CARE_PROVIDER_SITE_OTHER): Payer: BLUE CROSS/BLUE SHIELD | Admitting: *Deleted

## 2015-01-14 DIAGNOSIS — J309 Allergic rhinitis, unspecified: Secondary | ICD-10-CM | POA: Diagnosis not present

## 2015-01-20 ENCOUNTER — Ambulatory Visit (INDEPENDENT_AMBULATORY_CARE_PROVIDER_SITE_OTHER): Payer: BLUE CROSS/BLUE SHIELD

## 2015-01-20 DIAGNOSIS — J309 Allergic rhinitis, unspecified: Secondary | ICD-10-CM | POA: Diagnosis not present

## 2015-01-27 ENCOUNTER — Ambulatory Visit: Payer: Self-pay | Admitting: Neurology

## 2015-01-29 ENCOUNTER — Encounter: Payer: Self-pay | Admitting: Pain Medicine

## 2015-01-29 ENCOUNTER — Ambulatory Visit: Payer: BLUE CROSS/BLUE SHIELD | Attending: Pain Medicine | Admitting: Pain Medicine

## 2015-01-29 VITALS — BP 126/75 | HR 64 | Temp 97.9°F | Resp 18 | Ht 63.0 in | Wt 123.0 lb

## 2015-01-29 DIAGNOSIS — Z91041 Radiographic dye allergy status: Secondary | ICD-10-CM

## 2015-01-29 DIAGNOSIS — Z87891 Personal history of nicotine dependence: Secondary | ICD-10-CM | POA: Diagnosis not present

## 2015-01-29 DIAGNOSIS — F411 Generalized anxiety disorder: Secondary | ICD-10-CM | POA: Insufficient documentation

## 2015-01-29 DIAGNOSIS — Z79891 Long term (current) use of opiate analgesic: Secondary | ICD-10-CM

## 2015-01-29 DIAGNOSIS — Z889 Allergy status to unspecified drugs, medicaments and biological substances status: Secondary | ICD-10-CM

## 2015-01-29 DIAGNOSIS — Z79899 Other long term (current) drug therapy: Secondary | ICD-10-CM | POA: Diagnosis not present

## 2015-01-29 DIAGNOSIS — G8929 Other chronic pain: Secondary | ICD-10-CM | POA: Insufficient documentation

## 2015-01-29 DIAGNOSIS — M545 Low back pain, unspecified: Secondary | ICD-10-CM

## 2015-01-29 DIAGNOSIS — M25561 Pain in right knee: Secondary | ICD-10-CM | POA: Diagnosis not present

## 2015-01-29 DIAGNOSIS — G894 Chronic pain syndrome: Secondary | ICD-10-CM | POA: Diagnosis not present

## 2015-01-29 DIAGNOSIS — E785 Hyperlipidemia, unspecified: Secondary | ICD-10-CM | POA: Diagnosis not present

## 2015-01-29 DIAGNOSIS — M79604 Pain in right leg: Secondary | ICD-10-CM | POA: Insufficient documentation

## 2015-01-29 DIAGNOSIS — K589 Irritable bowel syndrome without diarrhea: Secondary | ICD-10-CM | POA: Insufficient documentation

## 2015-01-29 DIAGNOSIS — G90521 Complex regional pain syndrome I of right lower limb: Secondary | ICD-10-CM | POA: Insufficient documentation

## 2015-01-29 DIAGNOSIS — E039 Hypothyroidism, unspecified: Secondary | ICD-10-CM | POA: Insufficient documentation

## 2015-01-29 DIAGNOSIS — M8580 Other specified disorders of bone density and structure, unspecified site: Secondary | ICD-10-CM | POA: Diagnosis not present

## 2015-01-29 DIAGNOSIS — J449 Chronic obstructive pulmonary disease, unspecified: Secondary | ICD-10-CM | POA: Diagnosis not present

## 2015-01-29 DIAGNOSIS — F1120 Opioid dependence, uncomplicated: Secondary | ICD-10-CM

## 2015-01-29 DIAGNOSIS — M79606 Pain in leg, unspecified: Secondary | ICD-10-CM | POA: Diagnosis present

## 2015-01-29 DIAGNOSIS — F1190 Opioid use, unspecified, uncomplicated: Secondary | ICD-10-CM | POA: Diagnosis not present

## 2015-01-29 DIAGNOSIS — Z96651 Presence of right artificial knee joint: Secondary | ICD-10-CM

## 2015-01-29 NOTE — Patient Instructions (Addendum)
GENERAL RISKS AND COMPLICATIONS  What are the risk, side effects and possible complications? Generally speaking, most procedures are safe.  However, with any procedure there are risks, side effects, and the possibility of complications.  The risks and complications are dependent upon the sites that are lesioned, or the type of nerve block to be performed.  The closer the procedure is to the spine, the more serious the risks are.  Great care is taken when placing the radio frequency needles, block needles or lesioning probes, but sometimes complications can occur. 1. Infection: Any time there is an injection through the skin, there is a risk of infection.  This is why sterile conditions are used for these blocks.  There are four possible types of infection. 1. Localized skin infection. 2. Central Nervous System Infection-This can be in the form of Meningitis, which can be deadly. 3. Epidural Infections-This can be in the form of an epidural abscess, which can cause pressure inside of the spine, causing compression of the spinal cord with subsequent paralysis. This would require an emergency surgery to decompress, and there are no guarantees that the patient would recover from the paralysis. 4. Discitis-This is an infection of the intervertebral discs.  It occurs in about 1% of discography procedures.  It is difficult to treat and it may lead to surgery.        2. Pain: the needles have to go through skin and soft tissues, will cause soreness.       3. Damage to internal structures:  The nerves to be lesioned may be near blood vessels or    other nerves which can be potentially damaged.       4. Bleeding: Bleeding is more common if the patient is taking blood thinners such as  aspirin, Coumadin, Ticiid, Plavix, etc., or if he/she have some genetic predisposition  such as hemophilia. Bleeding into the spinal canal can cause compression of the spinal  cord with subsequent paralysis.  This would require an  emergency surgery to  decompress and there are no guarantees that the patient would recover from the  paralysis.       5. Pneumothorax:  Puncturing of a lung is a possibility, every time a needle is introduced in  the area of the chest or upper back.  Pneumothorax refers to free air around the  collapsed lung(s), inside of the thoracic cavity (chest cavity).  Another two possible  complications related to a similar event would include: Hemothorax and Chylothorax.   These are variations of the Pneumothorax, where instead of air around the collapsed  lung(s), you may have blood or chyle, respectively.       6. Spinal headaches: They may occur with any procedures in the area of the spine.       7. Persistent CSF (Cerebro-Spinal Fluid) leakage: This is a rare problem, but may occur  with prolonged intrathecal or epidural catheters either due to the formation of a fistulous  track or a dural tear.       8. Nerve damage: By working so close to the spinal cord, there is always a possibility of  nerve damage, which could be as serious as a permanent spinal cord injury with  paralysis.       9. Death:  Although rare, severe deadly allergic reactions known as "Anaphylactic  reaction" can occur to any of the medications used.      10. Worsening of the symptoms:  We can always make thing worse.    What are the chances of something like this happening? Chances of any of this occuring are extremely low.  By statistics, you have more of a chance of getting killed in a motor vehicle accident: while driving to the hospital than any of the above occurring .  Nevertheless, you should be aware that they are possibilities.  In general, it is similar to taking a shower.  Everybody knows that you can slip, hit your head and get killed.  Does that mean that you should not shower again?  Nevertheless always keep in mind that statistics do not mean anything if you happen to be on the wrong side of them.  Even if a procedure has a 1  (one) in a 1,000,000 (million) chance of going wrong, it you happen to be that one..Also, keep in mind that by statistics, you have more of a chance of having something go wrong when taking medications.  Who should not have this procedure? If you are on a blood thinning medication (e.g. Coumadin, Plavix, see list of "Blood Thinners"), or if you have an active infection going on, you should not have the procedure.  If you are taking any blood thinners, please inform your physician.  How should I prepare for this procedure?  Do not eat or drink anything at least six hours prior to the procedure.  Bring a driver with you .  It cannot be a taxi.  Come accompanied by an adult that can drive you back, and that is strong enough to help you if your legs get weak or numb from the local anesthetic.  Take all of your medicines the morning of the procedure with just enough water to swallow them.  If you have diabetes, make sure that you are scheduled to have your procedure done first thing in the morning, whenever possible.  If you have diabetes, take only half of your insulin dose and notify our nurse that you have done so as soon as you arrive at the clinic.  If you are diabetic, but only take blood sugar pills (oral hypoglycemic), then do not take them on the morning of your procedure.  You may take them after you have had the procedure.  Do not take aspirin or any aspirin-containing medications, at least eleven (11) days prior to the procedure.  They may prolong bleeding.  Wear loose fitting clothing that may be easy to take off and that you would not mind if it got stained with Betadine or blood.  Do not wear any jewelry or perfume  Remove any nail coloring.  It will interfere with some of our monitoring equipment.  NOTE: Remember that this is not meant to be interpreted as a complete list of all possible complications.  Unforeseen problems may occur.  BLOOD THINNERS The following drugs  contain aspirin or other products, which can cause increased bleeding during surgery and should not be taken for 2 weeks prior to and 1 week after surgery.  If you should need take something for relief of minor pain, you may take acetaminophen which is found in Tylenol,m Datril, Anacin-3 and Panadol. It is not blood thinner. The products listed below are.  Do not take any of the products listed below in addition to any listed on your instruction sheet.  A.P.C or A.P.C with Codeine Codeine Phosphate Capsules #3 Ibuprofen Ridaura  ABC compound Congesprin Imuran rimadil  Advil Cope Indocin Robaxisal  Alka-Seltzer Effervescent Pain Reliever and Antacid Coricidin or Coricidin-D  Indomethacin Rufen    Alka-Seltzer plus Cold Medicine Cosprin Ketoprofen S-A-C Tablets  Anacin Analgesic Tablets or Capsules Coumadin Korlgesic Salflex  Anacin Extra Strength Analgesic tablets or capsules CP-2 Tablets Lanoril Salicylate  Anaprox Cuprimine Capsules Levenox Salocol  Anexsia-D Dalteparin Magan Salsalate  Anodynos Darvon compound Magnesium Salicylate Sine-off  Ansaid Dasin Capsules Magsal Sodium Salicylate  Anturane Depen Capsules Marnal Soma  APF Arthritis pain formula Dewitt's Pills Measurin Stanback  Argesic Dia-Gesic Meclofenamic Sulfinpyrazone  Arthritis Bayer Timed Release Aspirin Diclofenac Meclomen Sulindac  Arthritis pain formula Anacin Dicumarol Medipren Supac  Analgesic (Safety coated) Arthralgen Diffunasal Mefanamic Suprofen  Arthritis Strength Bufferin Dihydrocodeine Mepro Compound Suprol  Arthropan liquid Dopirydamole Methcarbomol with Aspirin Synalgos  ASA tablets/Enseals Disalcid Micrainin Tagament  Ascriptin Doan's Midol Talwin  Ascriptin A/D Dolene Mobidin Tanderil  Ascriptin Extra Strength Dolobid Moblgesic Ticlid  Ascriptin with Codeine Doloprin or Doloprin with Codeine Momentum Tolectin  Asperbuf Duoprin Mono-gesic Trendar  Aspergum Duradyne Motrin or Motrin IB Triminicin  Aspirin  plain, buffered or enteric coated Durasal Myochrisine Trigesic  Aspirin Suppositories Easprin Nalfon Trillsate  Aspirin with Codeine Ecotrin Regular or Extra Strength Naprosyn Uracel  Atromid-S Efficin Naproxen Ursinus  Auranofin Capsules Elmiron Neocylate Vanquish  Axotal Emagrin Norgesic Verin  Azathioprine Empirin or Empirin with Codeine Normiflo Vitamin E  Azolid Emprazil Nuprin Voltaren  Bayer Aspirin plain, buffered or children's or timed BC Tablets or powders Encaprin Orgaran Warfarin Sodium  Buff-a-Comp Enoxaparin Orudis Zorpin  Buff-a-Comp with Codeine Equegesic Os-Cal-Gesic   Buffaprin Excedrin plain, buffered or Extra Strength Oxalid   Bufferin Arthritis Strength Feldene Oxphenbutazone   Bufferin plain or Extra Strength Feldene Capsules Oxycodone with Aspirin   Bufferin with Codeine Fenoprofen Fenoprofen Pabalate or Pabalate-SF   Buffets II Flogesic Panagesic   Buffinol plain or Extra Strength Florinal or Florinal with Codeine Panwarfarin   Buf-Tabs Flurbiprofen Penicillamine   Butalbital Compound Four-way cold tablets Penicillin   Butazolidin Fragmin Pepto-Bismol   Carbenicillin Geminisyn Percodan   Carna Arthritis Reliever Geopen Persantine   Carprofen Gold's salt Persistin   Chloramphenicol Goody's Phenylbutazone   Chloromycetin Haltrain Piroxlcam   Clmetidine heparin Plaquenil   Cllnoril Hyco-pap Ponstel   Clofibrate Hydroxy chloroquine Propoxyphen         Before stopping any of these medications, be sure to consult the physician who ordered them.  Some, such as Coumadin (Warfarin) are ordered to prevent or treat serious conditions such as "deep thrombosis", "pumonary embolisms", and other heart problems.  The amount of time that you may need off of the medication may also vary with the medication and the reason for which you were taking it.  If you are taking any of these medications, please make sure you notify your pain physician before you undergo any  procedures.         Selective Nerve Root Block Patient Information  Description: Specific nerve roots exit the spinal canal and these nerves can be compressed and inflamed by a bulging disc and bone spurs.  By injecting steroids on the nerve root, we can potentially decrease the inflammation surrounding these nerves, which often leads to decreased pain.  Also, by injecting local anesthesia on the nerve root, this can provide [place] helpful information to give to your referring doctor if it decreases your pain.  Selective nerve root blocks can be done along the spine from the neck to the low back depending on the location of your pain.   After numbing the skin with local anesthesia, a small needle is passed to the nerve root and  the position of the needle is verified using x-ray pictures.  After the needle is in correct position, we then deposit the medication.  You may experience a pressure sensation while this is being done.  The entire block usually lasts less than 15 minutes.  Conditions that may be treated with selective nerve root blocks:  Low back and leg pain  Spinal stenosis  Diagnostic block prior to potential surgery  Neck and arm pain  Post laminectomy syndrome  Preparation for the injection:  1. Do not eat any solid food or dairy products within 6 hours of your appointment. 2. You may drink clear liquids up to 2 hours before an appointment.  Clear liquids include water, black coffee, juice or soda.  No milk or cream please. 3. You may take your regular medications, including pain medications, with a sip of water before your appointment.  Diabetics should hold regular insulin (if taken separately) and take 1/2 normal NPH dose the morning of the procedure.  Carry some sugar containing items with you to your appointment. 4. A driver must accompany you and be prepared to drive you home after your procedure. 5. Bring all your current medications with you. 6. An IV may be inserted  and sedation may be given at the discretion of the physician. 7. A blood pressure cuff, EKG, and other monitors will often be applied during the procedure.  Some patients may need to have extra oxygen administered for a short period. 8. You will be asked to provide medical information, including allergies, prior to the procedure.  We must know immediately if you are taking blood  Thinners (like Coumadin) or if you are allergic to IV iodine contrast (dye).  Possible side-effects: All are usually temporary  Bleeding from needle site  Light headedness  Numbness and tingling  Decreased blood pressure  Weakness in arms/legs  Pressure sensation in back/neck  Pain at injection site (several days)  Possible complications: All are extremely rare  Infection  Nerve injury  Spinal headache (a headache wore with upright position)  Call if you experience:  Fever/chills associated with headache or increased back/neck pain  Headache worsened by an upright position  New onset weakness or numbness of an extremity below the injection site  Hives or difficulty breathing (go to the emergency room)  Inflammation or drainage at the injection site(s)  Severe back/neck pain greater than usual  New symptoms which are concerning to you  Please note:  Although the local anesthetic injected can often make your back or neck feel good for several hours after the injection the pain will likely return.  It takes 3-5 days for steroids to work on the nerve root. You may not notice any pain relief for at least one week.  If effective, we will often do a series of 3 injections spaced 3-6 weeks apart to maximally decrease your pain.    If you have any questions, please call [phone] [facility] A knee injection is a procedure to get medicine into your knee joint. Your health care provider puts a needle into the joint and injects medicine with an  attached syringe. The injected medicine may relieve the pain, swelling, and stiffness of arthritis. The injected medicine may also help to lubricate and cushion your knee joint. You may need more than one injection. LET [facility] CARE PROVIDER KNOW ABOUT: 6. Any allergies you have. 7. All medicines you are taking, including vitamins, herbs, eye drops, creams, and  over-the-counter medicines. 8. Previous problems you or members of your family have had with the use of anesthetics. 9. Any blood disorders you have. 10. Previous surgeries you have had. 11. Any medical conditions you may have. RISKS AND COMPLICATIONS Generally, this is a safe procedure. However, problems may occur, including: 9. Infection. 10. Bleeding. 11. Worsening symptoms. 12. Damage to the area around your knee. 13. Allergic reaction to any of the medicines. 14. Skin reactions from repeated injections. BEFORE THE PROCEDURE 8. Ask your health care provider about changing or stopping your regular medicines. This is especially important if you are taking diabetes medicines or blood thinners. 9. Plan to have someone take you home after the procedure. PROCEDURE  You will sit or lie down in a position for your knee to be treated.  The skin over your kneecap will be cleaned with a germ-killing solution (antiseptic).  You will be given a medicine that numbs the area (local anesthetic). You may feel some stinging.  After your knee becomes numb, you will have a second injection. This is the medicine. This needle is carefully placed between your kneecap and your knee. The medicine is injected into the joint space.  At the end of the procedure, the needle will be removed.  A bandage (dressing) may be placed over the injection site. The procedure may vary among health care providers and hospitals. AFTER THE PROCEDURE 19. You may have to move your knee through its full range of motion. This helps to get all of the medicine into  your joint space. 20. Your blood pressure, heart rate, breathing rate, and blood oxygen level will be monitored often until the medicines you were given have worn off. 21. You will be watched to make sure that you do not have a reaction to the injected medicine.   This information is not intended to replace advice given to you by your health care provider. Make sure you discuss any questions you have with your health care provider.   Document Released: 05/02/2006 Document Revised: 03/01/2014 Document Reviewed: 12/19/2013 Elsevier Interactive Patient Education [employer].

## 2015-01-29 NOTE — Progress Notes (Signed)
Safety precautions to be maintained throughout the outpatient stay will include: orient to surroundings, keep bed in low position, maintain call bell within reach at all times, provide assistance with transfer out of bed and ambulation.  Pt would like another RF treatment asap

## 2015-01-31 DIAGNOSIS — Z91041 Radiographic dye allergy status: Secondary | ICD-10-CM | POA: Insufficient documentation

## 2015-01-31 NOTE — Progress Notes (Signed)
Patient's Name: Kari Green MRN: [id number] DOB: 12/11/1965 DOS: 01/29/2015  Primary Reason(s) for Visit: Encounter for Pre-op Evaluation CC: Leg Pain   HPI:   [name] is a 49 y.o. year old, female patient, who returns today as an established patient. She has Irritable bowel syndrome with constipation; Abdominal pain, epigastric; Abdominal adhesions; COPD (chronic obstructive pulmonary disease) ([place]); Hypothyroidism; Left shoulder pain; Memory deficits; Asthma; Allergic rhinitis; Chronic low back pain; CRPS (complex regional pain syndrome) type I of lower limb (Right-sided); History of arthroplasty of right knee; Chronic pain syndrome; Chronic pain; Chronic right knee pain; Generalized anxiety disorder; Chronic pain of right lower extremity; Multiple drug allergies (codeine, Lidoderm a DC if, sulfa, latex, IVP dye, Keflex, Augmentin); Latex allergy; Long term current use of opiate analgesic; Long term prescription opiate use; Opiate use; Opiate dependence ([place]); and History of allergy to IVP dye on her problem list.. Her primarily concern today is the Leg Pain     The patient returns to the clinic's today to be evaluated prior to a repeat genicular nerve radiofrequency ablation of the right knee. This patient has a very complicated history of CRPS of the lower extremity as well as chronic pain on the right knee. The patient was referred to me by Dr. Carroll Green for a right-sided lumbar sympathetic radiofrequency ablation. This was successfully accomplished and once the patient's sympathetically mediated symptoms went away, she then started noticing more than knee pain. A diagnostic right knee genicular nerve block was offered and when the patient obtained good relief this was followed by radiofrequency. Again the patient was able to get good relief of the pain and also indicated having regained significant range of motion of the knee joint. With this along, she was able to advance her  physical therapy. Unfortunately, the benefits of the radiofrequency are now wearing off and she would like to have both repeated. She indicates that the knee is worse and would like to have the genicular nerve radiofrequency done first, just in case this flares up the CRPS which she would then like to have done next. This actually makes perfect sense to me and therefore we will proceed with it in that order.  Today's Pain Score: 9 , clinically she looks more like a 4-5/10. Reported level of pain is incompatible with clinical obrservations. This may be secondary to a possible lack of understanding on how the pain scale works. Pain Type: Chronic pain Pain Location: Leg Pain Orientation: Right Pain Descriptors / Indicators: Aching, Constant, Burning, Radiating (unable to bed at night) Pain Frequency: Constant  Date of Last Visit: 08/13/14 Service Provided on Last Visit: Evaluation (eval from a procedure- saw pa)  Pharmacotherapy Review: The patient's medication management is currently not under our care   Allergies: [name] is allergic to chloraprep one step; latex; other; cobalt; nickel; cortisone; morphine and related; peanuts; prednisone; red dye; surgical lubricant; tape; and yellow dyes (non-tartrazine).  Meds: The patient has a current medication list which includes the following prescription(s): albuterol, albuterol, albuterol, alprazolam, budesonide-formoterol, budesonide-formoterol, budesonide-formoterol, cyanocobalamin, diphenhydramine, epinephrine, estradiol, evzio, fluticasone, fluticasone, fluticasone-salmeterol, hydroxyzine, hyoscyamine, ketoconazole, levothyroxine, methocarbamol, multivitamin with minerals, nucynta, promethazine, topiramate, trospium chloride, UNABLE TO FIND, and zoledronic acid. Requested Prescriptions    No prescriptions requested or ordered in this encounter    ROS: Constitutional: Afebrile, no chills, well hydrated and well  nourished Gastrointestinal: negative Musculoskeletal:negative Neurological: negative Behavioral/Psych: negative  PFSH: Medical:  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 Family: family history includes Anesthesia problems in her mother; Cancer in her daughter, maternal aunt, and mother; Leukemia in her mother. Surgical:  has past surgical history that includes Total thyroidectomy (1993); cysto with hydrodistension (01/29/2011); Cystoscopy w/ retrogrades (01/29/2011); Fissurectomy (~ 1988 x2); Refractive surgery (Bilateral, ~ 1999; ~ 2013); Nasal sinus surgery (2013); laparoscopy (02/11/2012); Laparoscopic lysis of adhesions (02/11/2012); Excision haglund's deformity with achilles tendon repair (Bilateral, 2013-2014); Melanoma excision (Left, 2013); Excisional hemorrhoidectomy (03/2011); Cystoscopy with stent placement (2006); Appendectomy (~ 1992); Tonsillectomy and adenoidectomy (~ 1989); LAPAROSCOPY ADHESIOLYSIS AND REMOVAL POSSIBLE RIGHT OVARY REMNANT (04-18-2000); Knee arthroscopy (Right, x3  in 2002); Carpal tunnel release (Bilateral, right 07-26-2001/  left); Laparoscopic cholecystectomy (01-03-2003); DEBRIDEMENT RIGHT THUMB MP JOINT (04-11-1999); RIGHT THUMB FUSION OF MPJ (10-03-2002); DECOMPRESSION LEFT ULNAR NERVE, ELBOW  (01-29-2003); KNEE UNICOMPARTMENTAL REPLACEMENT, PATELLOFERMORAL JOINT (09-10-2003); RIGHT TOTAL KNEE REVISION ARTHROPLASTY (03-14-2007); CYSTO/ URETHRAL DILATION/ HYDRODISTENTION/ BLADDER BX (04-10-2003); Laparoscopic assisted vaginal hysterectomy (Feb 1999); LAPAROSCOPY LYSIS ADHESIONS/  BILATERAL SALPINGOOPHORECTOMY/  FULGERATION OF ENDOMETRIOSIS (1998); Anterior cervical decomp/discectomy fusion (03-10-2009); Esophagogastroduodenoscopy (egd) with esophageal dilation (10-26-2000); Augmentation mammaplasty (Bilateral, 1996; 2001); Breast surgery (1980's); CYSTO/  BLADDER BX (03-08-2008); transthoracic echocardiogram (09-22-2012); cysto with hydrodistension (N/A, 05/08/2014); and gangelion cyst. Tobacco:  reports that she quit smoking about 8 years ago. She does not have any smokeless tobacco history on file. Alcohol:  reports that she does not drink alcohol. Drug:  reports that she does not use illicit drugs.  Physical Exam: Vitals:  Today's Vitals   01/29/15 1511 01/29/15 1512  BP: 126/75   Pulse: 64   Temp: 97.9 F (36.6 C)   TempSrc: Oral   Resp: 18   Height: 5\' 3"  (1.6 m)   Weight: 123 lb (55.792 kg)   SpO2: 99%   PainSc:  9   Calculated BMI: Body mass index is 21.79 kg/(m^2). General appearance: alert, cooperative, appears older than stated age and moderate distress Eyes: PERLA Respiratory: No evidence respiratory distress, no audible rales or ronchi and no use of accessory muscles of respiration Neck: no adenopathy, no carotid bruit, no JVD, supple, symmetrical, trachea midline and thyroid not enlarged, symmetric, no tenderness/mass/nodules  Cervical Spine ROM: Adequate for flexion, extension, rotation, and lateral bending Palpation: No palpable trigger points  Upper Extremities ROM: Adequate bilaterally Strength: 5/5 for all flexors and extensors of the upper extremity, bilaterally Pulses: Palpable bilaterally Neurologic: No allodynia, No hyperesthesia, No hyperpathia and No  sensory abnormalities  Lumbar Spine ROM: Adequate for flexion, extension, rotation, and lateral bending Palpation: No palpable trigger points Lumbar Hyperextension and rotation: Non-contributory Patrick's Maneuver: Non-contributory  Lower Extremities ROM: Significant decreased range of motion of the right lower extremity, especially around the area of the knee. Strength: The patient presents with giveaway weakness and guarding secondary to her CRPS. Pulses: Palpable bilaterally Neurologic: Positive for allodynia, hyperesthesia, hyperalgesia, as well as mild: And temperature changes.  Assessment: Encounter Diagnosis:  Primary Diagnosis: Chronic pain [G89.29]  Plan: Interventional Therapies: We will start with a right knee genicular nerve radiofrequency ablation under fluoroscopic guidance and IV  sedation. After later time, we will follow up with a right-sided lumbar sympathetic radiofrequency ablation (sympathectomy) under fluoroscopic guidance and IV sedation.  Kerilee was seen today for leg pain.  Diagnoses and all orders for this visit:  Chronic pain -     B12 and Folate Panel; Future -     COMPLETE METABOLIC PANEL WITH GFR; Future -     C-reactive protein; Future -     Magnesium; Future -     Sedimentation rate; Future -     Vitamin D pnl(25-hydrxy+1,25-dihy)-bld; Future  Opiate use  Uncomplicated opioid dependence ([place])  Long term prescription opiate use  Long term current use of opiate analgesic -     Drugs of abuse screen w/o alc, rtn urine-sln  History of arthroplasty of right knee -     RADIOFREQUENCY, CERVICAL THORACIC LUMBER, GENICULAR ; Future  Chronic right knee pain -     RADIOFREQUENCY, CERVICAL THORACIC LUMBER, GENICULAR ; Future  CRPS (complex regional pain syndrome) type I of lower limb, right  Chronic pain syndrome  Chronic pain of right lower extremity  Chronic low back pain  Multiple drug allergies (codeine, Lidoderm a DC if, sulfa, latex, IVP  dye, Keflex, Augmentin)  History of allergy to IVP dye     Patient Instructions   GENERAL RISKS AND COMPLICATIONS  What are the risk, side effects and possible complications? Generally speaking, most procedures are safe.  However, with any procedure there are risks, side effects, and the possibility of complications.  The risks and complications are dependent upon the sites that are lesioned, or the type of nerve block to be performed.  The closer the procedure is to the spine, the more serious the risks are.  Great care is taken when placing the radio frequency needles, block needles or lesioning probes, but sometimes complications can occur. 1. Infection: Any time there is an injection through the skin, there is a risk of infection.  This is why sterile conditions are used for these blocks.  There are four possible types of infection. 1. Localized skin infection. 2. Central Nervous System Infection-This can be in the form of Meningitis, which can be deadly. 3. Epidural Infections-This can be in the form of an epidural abscess, which can cause pressure inside of the spine, causing compression of the spinal cord with subsequent paralysis. This would require an emergency surgery to decompress, and there are no guarantees that the patient would recover from the paralysis. 4. Discitis-This is an infection of the intervertebral discs.  It occurs in about 1% of discography procedures.  It is difficult to treat and it may lead to surgery.        2. Pain: the needles have to go through skin and soft tissues, will cause soreness.       3. Damage to internal structures:  The nerves to be lesioned may be near blood vessels or    other nerves which can be potentially damaged.       4. Bleeding: Bleeding is more common if the patient is taking blood thinners such as  aspirin, Coumadin, Ticiid, Plavix, etc., or if he/she have some genetic predisposition  such as hemophilia. Bleeding into the spinal canal can  cause compression of the spinal  cord with subsequent paralysis.  This would require an emergency surgery to  decompress and there are no guarantees that the patient would recover from the  paralysis.       5. Pneumothorax:  Puncturing of a lung  is a possibility, every time a needle is introduced in  the area of the chest or upper back.  Pneumothorax refers to free air around the  collapsed lung(s), inside of the thoracic cavity (chest cavity).  Another two possible  complications related to a similar event would include: Hemothorax and Chylothorax.   These are variations of the Pneumothorax, where instead of air around the collapsed  lung(s), you may have blood or chyle, respectively.       6. Spinal headaches: They may occur with any procedures in the area of the spine.       7. Persistent CSF (Cerebro-Spinal Fluid) leakage: This is a rare problem, but may occur  with prolonged intrathecal or epidural catheters either due to the formation of a fistulous  track or a dural tear.       8. Nerve damage: By working so close to the spinal cord, there is always a possibility of  nerve damage, which could be as serious as a permanent spinal cord injury with  paralysis.       9. Death:  Although rare, severe deadly allergic reactions known as "Anaphylactic  reaction" can occur to any of the medications used.      10. Worsening of the symptoms:  We can always make thing worse.  What are the chances of something like this happening? Chances of any of this occuring are extremely low.  By statistics, you have more of a chance of getting killed in a motor vehicle accident: while driving to the hospital than any of the above occurring .  Nevertheless, you should be aware that they are possibilities.  In general, it is similar to taking a shower.  Everybody knows that you can slip, hit your head and get killed.  Does that mean that you should not shower again?  Nevertheless always keep in mind that statistics do not mean  anything if you happen to be on the wrong side of them.  Even if a procedure has a 1 (one) in a 1,000,000 (million) chance of going wrong, it you happen to be that one..Also, keep in mind that by statistics, you have more of a chance of having something go wrong when taking medications.  Who should not have this procedure? If you are on a blood thinning medication (e.g. Coumadin, Plavix, see list of "Blood Thinners"), or if you have an active infection going on, you should not have the procedure.  If you are taking any blood thinners, please inform your physician.  How should I prepare for this procedure?  Do not eat or drink anything at least six hours prior to the procedure.  Bring a driver with you .  It cannot be a taxi.  Come accompanied by an adult that can drive you back, and that is strong enough to help you if your legs get weak or numb from the local anesthetic.  Take all of your medicines the morning of the procedure with just enough water to swallow them.  If you have diabetes, make sure that you are scheduled to have your procedure done first thing in the morning, whenever possible.  If you have diabetes, take only half of your insulin dose and notify our nurse that you have done so as soon as you arrive at the clinic.  If you are diabetic, but only take blood sugar pills (oral hypoglycemic), then do not take them on the morning of your procedure.  You may take them after you  have had the procedure.  Do not take aspirin or any aspirin-containing medications, at least eleven (11) days prior to the procedure.  They may prolong bleeding.  Wear loose fitting clothing that may be easy to take off and that you would not mind if it got stained with Betadine or blood.  Do not wear any jewelry or perfume  Remove any nail coloring.  It will interfere with some of our monitoring equipment.  NOTE: Remember that this is not meant to be interpreted as a complete list of all possible  complications.  Unforeseen problems may occur.  BLOOD THINNERS The following drugs contain aspirin or other products, which can cause increased bleeding during surgery and should not be taken for 2 weeks prior to and 1 week after surgery.  If you should need take something for relief of minor pain, you may take acetaminophen which is found in Tylenol,m Datril, Anacin-3 and Panadol. It is not blood thinner. The products listed below are.  Do not take any of the products listed below in addition to any listed on your instruction sheet.  A.P.C or A.P.C with Codeine Codeine Phosphate Capsules #3 Ibuprofen Ridaura  ABC compound Congesprin Imuran rimadil  Advil Cope Indocin Robaxisal  Alka-Seltzer Effervescent Pain Reliever and Antacid Coricidin or Coricidin-D  Indomethacin Rufen  Alka-Seltzer plus Cold Medicine Cosprin Ketoprofen S-A-C Tablets  Anacin Analgesic Tablets or Capsules Coumadin Korlgesic Salflex  Anacin Extra Strength Analgesic tablets or capsules CP-2 Tablets Lanoril Salicylate  Anaprox Cuprimine Capsules Levenox Salocol  Anexsia-D Dalteparin Magan Salsalate  Anodynos Darvon compound Magnesium Salicylate Sine-off  Ansaid Dasin Capsules Magsal Sodium Salicylate  Anturane Depen Capsules Marnal Soma  APF Arthritis pain formula Dewitt's Pills Measurin Stanback  Argesic Dia-Gesic Meclofenamic Sulfinpyrazone  Arthritis Bayer Timed Release Aspirin Diclofenac Meclomen Sulindac  Arthritis pain formula Anacin Dicumarol Medipren Supac  Analgesic (Safety coated) Arthralgen Diffunasal Mefanamic Suprofen  Arthritis Strength Bufferin Dihydrocodeine Mepro Compound Suprol  Arthropan liquid Dopirydamole Methcarbomol with Aspirin Synalgos  ASA tablets/Enseals Disalcid Micrainin Tagament  Ascriptin Doan's Midol Talwin  Ascriptin A/D Dolene Mobidin Tanderil  Ascriptin Extra Strength Dolobid Moblgesic Ticlid  Ascriptin with Codeine Doloprin or Doloprin with Codeine Momentum Tolectin  Asperbuf Duoprin  Mono-gesic Trendar  Aspergum Duradyne Motrin or Motrin IB Triminicin  Aspirin plain, buffered or enteric coated Durasal Myochrisine Trigesic  Aspirin Suppositories Easprin Nalfon Trillsate  Aspirin with Codeine Ecotrin Regular or Extra Strength Naprosyn Uracel  Atromid-S Efficin Naproxen Ursinus  Auranofin Capsules Elmiron Neocylate Vanquish  Axotal Emagrin Norgesic Verin  Azathioprine Empirin or Empirin with Codeine Normiflo Vitamin E  Azolid Emprazil Nuprin Voltaren  Bayer Aspirin plain, buffered or children's or timed BC Tablets or powders Encaprin Orgaran Warfarin Sodium  Buff-a-Comp Enoxaparin Orudis Zorpin  Buff-a-Comp with Codeine Equegesic Os-Cal-Gesic   Buffaprin Excedrin plain, buffered or Extra Strength Oxalid   Bufferin Arthritis Strength Feldene Oxphenbutazone   Bufferin plain or Extra Strength Feldene Capsules Oxycodone with Aspirin   Bufferin with Codeine Fenoprofen Fenoprofen Pabalate or Pabalate-SF   Buffets II Flogesic Panagesic   Buffinol plain or Extra Strength Florinal or Florinal with Codeine Panwarfarin   Buf-Tabs Flurbiprofen Penicillamine   Butalbital Compound Four-way cold tablets Penicillin   Butazolidin Fragmin Pepto-Bismol   Carbenicillin Geminisyn Percodan   Carna Arthritis Reliever Geopen Persantine   Carprofen Gold's salt Persistin   Chloramphenicol Goody's Phenylbutazone   Chloromycetin Haltrain Piroxlcam   Clmetidine heparin Plaquenil   Cllnoril Hyco-pap Ponstel   Clofibrate Hydroxy chloroquine Propoxyphen  Before stopping any of these medications, be sure to consult the physician who ordered them.  Some, such as Coumadin (Warfarin) are ordered to prevent or treat serious conditions such as "deep thrombosis", "pumonary embolisms", and other heart problems.  The amount of time that you may need off of the medication may also vary with the medication and the reason for which you were taking it.  If you are taking any of these medications, please  make sure you notify your pain physician before you undergo any procedures.         Selective Nerve Root Block Patient Information  Description: Specific nerve roots exit the spinal canal and these nerves can be compressed and inflamed by a bulging disc and bone spurs.  By injecting steroids on the nerve root, we can potentially decrease the inflammation surrounding these nerves, which often leads to decreased pain.  Also, by injecting local anesthesia on the nerve root, this can provide [place] helpful information to give to your referring doctor if it decreases your pain.  Selective nerve root blocks can be done along the spine from the neck to the low back depending on the location of your pain.   After numbing the skin with local anesthesia, a small needle is passed to the nerve root and the position of the needle is verified using x-ray pictures.  After the needle is in correct position, we then deposit the medication.  You may experience a pressure sensation while this is being done.  The entire block usually lasts less than 15 minutes.  Conditions that may be treated with selective nerve root blocks:  Low back and leg pain  Spinal stenosis  Diagnostic block prior to potential surgery  Neck and arm pain  Post laminectomy syndrome  Preparation for the injection:  1. Do not eat any solid food or dairy products within 6 hours of your appointment. 2. You may drink clear liquids up to 2 hours before an appointment.  Clear liquids include water, black coffee, juice or soda.  No milk or cream please. 3. You may take your regular medications, including pain medications, with a sip of water before your appointment.  Diabetics should hold regular insulin (if taken separately) and take 1/2 normal NPH dose the morning of the procedure.  Carry some sugar containing items with you to your appointment. 4. A driver must accompany you and be prepared to drive you home after your procedure. 5. Bring  all your current medications with you. 6. An IV may be inserted and sedation may be given at the discretion of the physician. 7. A blood pressure cuff, EKG, and other monitors will often be applied during the procedure.  Some patients may need to have extra oxygen administered for a short period. 8. You will be asked to provide medical information, including allergies, prior to the procedure.  We must know immediately if you are taking blood  Thinners (like Coumadin) or if you are allergic to IV iodine contrast (dye).  Possible side-effects: All are usually temporary  Bleeding from needle site  Light headedness  Numbness and tingling  Decreased blood pressure  Weakness in arms/legs  Pressure sensation in back/neck  Pain at injection site (several days)  Possible complications: All are extremely rare  Infection  Nerve injury  Spinal headache (a headache wore with upright position)  Call if you experience:  Fever/chills associated with headache or increased back/neck pain  Headache worsened by an upright position  New onset weakness or  numbness of an extremity below the injection site  Hives or difficulty breathing (go to the emergency room)  Inflammation or drainage at the injection site(s)  Severe back/neck pain greater than usual  New symptoms which are concerning to you  Please note:  Although the local anesthetic injected can often make your back or neck feel good for several hours after the injection the pain will likely return.  It takes 3-5 days for steroids to work on the nerve root. You may not notice any pain relief for at least one week.  If effective, we will often do a series of 3 injections spaced 3-6 weeks apart to maximally decrease your pain.    If you have any questions, please call [phone] [facility] A knee injection is a procedure to get medicine into your knee joint. Your health care  provider puts a needle into the joint and injects medicine with an attached syringe. The injected medicine may relieve the pain, swelling, and stiffness of arthritis. The injected medicine may also help to lubricate and cushion your knee joint. You may need more than one injection. LET [facility] CARE PROVIDER KNOW ABOUT: 6. Any allergies you have. 7. All medicines you are taking, including vitamins, herbs, eye drops, creams, and over-the-counter medicines. 8. Previous problems you or members of your family have had with the use of anesthetics. 9. Any blood disorders you have. 10. Previous surgeries you have had. 11. Any medical conditions you may have. RISKS AND COMPLICATIONS Generally, this is a safe procedure. However, problems may occur, including: 9. Infection. 10. Bleeding. 11. Worsening symptoms. 12. Damage to the area around your knee. 13. Allergic reaction to any of the medicines. 14. Skin reactions from repeated injections. BEFORE THE PROCEDURE 8. Ask your health care provider about changing or stopping your regular medicines. This is especially important if you are taking diabetes medicines or blood thinners. 9. Plan to have someone take you home after the procedure. PROCEDURE  You will sit or lie down in a position for your knee to be treated.  The skin over your kneecap will be cleaned with a germ-killing solution (antiseptic).  You will be given a medicine that numbs the area (local anesthetic). You may feel some stinging.  After your knee becomes numb, you will have a second injection. This is the medicine. This needle is carefully placed between your kneecap and your knee. The medicine is injected into the joint space.  At the end of the procedure, the needle will be removed.  A bandage (dressing) may be placed over the injection site. The procedure may vary among health care providers and hospitals. AFTER THE PROCEDURE 19. You may have to move your knee through its  full range of motion. This helps to get all of the medicine into your joint space. 20. Your blood pressure, heart rate, breathing rate, and blood oxygen level will be monitored often until the medicines you were given have worn off. 21. You will be watched to make sure that you do not have a reaction to the injected medicine.   This information is not intended to replace advice given to you by your health care provider. Make sure you discuss any questions you have with your health care provider.   Document Released: 05/02/2006 Document Revised: 03/01/2014 Document Reviewed: 12/19/2013 Elsevier Interactive Patient Education 2016 [employer].    Medications discontinued today:  Medications Discontinued During This Encounter  Medication Reason  . Botulinum  Toxin Type A 200 UNITS SOLR Error  . cholecalciferol (VITAMIN D) 1000 UNITS tablet Error  . HYDROcodone-acetaminophen (NORCO/VICODIN) 5-325 MG per tablet Error  . phenazopyridine (PYRIDIUM) 200 MG tablet Error  . tapentadol (NUCYNTA) 50 MG TABS tablet Error  . montelukast (SINGULAIR) 10 MG tablet Error   Medications administered today:  Ms. Francavilla had no medications administered during this visit.  Primary Care Physician: Thressa Sheller, MD Location: [facility] Note by: Kathlen Brunswick. Dossie Arbour, M.D, DABA, DABAPM, DABPM, DABIPP, FIPP

## 2015-02-04 ENCOUNTER — Encounter (HOSPITAL_BASED_OUTPATIENT_CLINIC_OR_DEPARTMENT_OTHER): Payer: Self-pay | Admitting: *Deleted

## 2015-02-05 ENCOUNTER — Encounter (HOSPITAL_BASED_OUTPATIENT_CLINIC_OR_DEPARTMENT_OTHER): Payer: Self-pay | Admitting: *Deleted

## 2015-02-05 ENCOUNTER — Ambulatory Visit (INDEPENDENT_AMBULATORY_CARE_PROVIDER_SITE_OTHER): Payer: BLUE CROSS/BLUE SHIELD

## 2015-02-05 DIAGNOSIS — J309 Allergic rhinitis, unspecified: Secondary | ICD-10-CM

## 2015-02-06 ENCOUNTER — Encounter (HOSPITAL_BASED_OUTPATIENT_CLINIC_OR_DEPARTMENT_OTHER): Payer: Self-pay | Admitting: *Deleted
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
## 2015-02-10 ENCOUNTER — Ambulatory Visit (HOSPITAL_BASED_OUTPATIENT_CLINIC_OR_DEPARTMENT_OTHER): Payer: BLUE CROSS/BLUE SHIELD | Admitting: Anesthesiology

## 2015-02-10 ENCOUNTER — Encounter (HOSPITAL_BASED_OUTPATIENT_CLINIC_OR_DEPARTMENT_OTHER): Payer: Self-pay

## 2015-02-10 ENCOUNTER — Ambulatory Visit (HOSPITAL_BASED_OUTPATIENT_CLINIC_OR_DEPARTMENT_OTHER)
Admission: RE | Admit: 2015-02-10 | Discharge: 2015-02-10 | Disposition: A | Discharge status: Home / Self Care | Payer: BLUE CROSS/BLUE SHIELD | Source: Ambulatory Visit | Attending: Orthopedic Surgery | Admitting: Orthopedic Surgery

## 2015-02-10 ENCOUNTER — Encounter (HOSPITAL_BASED_OUTPATIENT_CLINIC_OR_DEPARTMENT_OTHER)
Admission: RE | Disposition: A | Discharge status: Home / Self Care | Payer: Self-pay | Source: Ambulatory Visit | Attending: Orthopedic Surgery

## 2015-02-10 DIAGNOSIS — Z91012 Allergy to eggs: Secondary | ICD-10-CM | POA: Diagnosis not present

## 2015-02-10 DIAGNOSIS — Z91040 Latex allergy status: Secondary | ICD-10-CM | POA: Diagnosis not present

## 2015-02-10 DIAGNOSIS — J302 Other seasonal allergic rhinitis: Secondary | ICD-10-CM | POA: Diagnosis not present

## 2015-02-10 DIAGNOSIS — Z87442 Personal history of urinary calculi: Secondary | ICD-10-CM | POA: Diagnosis not present

## 2015-02-10 DIAGNOSIS — M67431 Ganglion, right wrist: Secondary | ICD-10-CM | POA: Diagnosis not present

## 2015-02-10 DIAGNOSIS — M5432 Sciatica, left side: Secondary | ICD-10-CM | POA: Diagnosis not present

## 2015-02-10 DIAGNOSIS — Z888 Allergy status to other drugs, medicaments and biological substances status: Secondary | ICD-10-CM | POA: Diagnosis not present

## 2015-02-10 DIAGNOSIS — M5030 Other cervical disc degeneration, unspecified cervical region: Secondary | ICD-10-CM | POA: Insufficient documentation

## 2015-02-10 DIAGNOSIS — M19041 Primary osteoarthritis, right hand: Secondary | ICD-10-CM | POA: Diagnosis not present

## 2015-02-10 DIAGNOSIS — E039 Hypothyroidism, unspecified: Secondary | ICD-10-CM | POA: Insufficient documentation

## 2015-02-10 DIAGNOSIS — Z91041 Radiographic dye allergy status: Secondary | ICD-10-CM | POA: Insufficient documentation

## 2015-02-10 DIAGNOSIS — M810 Age-related osteoporosis without current pathological fracture: Secondary | ICD-10-CM | POA: Insufficient documentation

## 2015-02-10 DIAGNOSIS — K589 Irritable bowel syndrome without diarrhea: Secondary | ICD-10-CM | POA: Insufficient documentation

## 2015-02-10 DIAGNOSIS — M5136 Other intervertebral disc degeneration, lumbar region: Secondary | ICD-10-CM | POA: Insufficient documentation

## 2015-02-10 DIAGNOSIS — M79601 Pain in right arm: Secondary | ICD-10-CM | POA: Diagnosis not present

## 2015-02-10 DIAGNOSIS — G894 Chronic pain syndrome: Secondary | ICD-10-CM | POA: Insufficient documentation

## 2015-02-10 DIAGNOSIS — M13841 Other specified arthritis, right hand: Secondary | ICD-10-CM | POA: Diagnosis not present

## 2015-02-10 DIAGNOSIS — M189 Osteoarthritis of first carpometacarpal joint, unspecified: Secondary | ICD-10-CM | POA: Insufficient documentation

## 2015-02-10 DIAGNOSIS — Z91010 Allergy to peanuts: Secondary | ICD-10-CM | POA: Diagnosis not present

## 2015-02-10 DIAGNOSIS — Z981 Arthrodesis status: Secondary | ICD-10-CM | POA: Diagnosis not present

## 2015-02-10 DIAGNOSIS — Z885 Allergy status to narcotic agent status: Secondary | ICD-10-CM | POA: Diagnosis not present

## 2015-02-10 DIAGNOSIS — M5431 Sciatica, right side: Secondary | ICD-10-CM | POA: Insufficient documentation

## 2015-02-10 DIAGNOSIS — M797 Fibromyalgia: Secondary | ICD-10-CM | POA: Insufficient documentation

## 2015-02-10 DIAGNOSIS — F419 Anxiety disorder, unspecified: Secondary | ICD-10-CM | POA: Diagnosis not present

## 2015-02-10 DIAGNOSIS — G8918 Other acute postprocedural pain: Secondary | ICD-10-CM | POA: Diagnosis not present

## 2015-02-10 HISTORY — DX: Stiffness of unspecified joint, not elsewhere classified: M25.60

## 2015-02-10 HISTORY — PX: CARPOMETACARPEL SUSPENSION PLASTY: SHX5005

## 2015-02-10 HISTORY — DX: Personal history of urinary calculi: Z87.442

## 2015-02-10 HISTORY — PX: GANGLION CYST EXCISION: SHX1691

## 2015-02-10 HISTORY — DX: Sciatica, unspecified side: M54.30

## 2015-02-10 HISTORY — DX: Other specified symptoms and signs involving the digestive system and abdomen: R19.8

## 2015-02-10 HISTORY — DX: Functional dyspepsia: K30

## 2015-02-10 HISTORY — DX: Other intervertebral disc degeneration, lumbar region without mention of lumbar back pain or lower extremity pain: M51.369

## 2015-02-10 HISTORY — DX: Other intervertebral disc degeneration, lumbar region: M51.36

## 2015-02-10 HISTORY — DX: Chronic pain syndrome: G89.4

## 2015-02-10 HISTORY — DX: Other seasonal allergic rhinitis: J30.2

## 2015-02-10 HISTORY — DX: Pain in right knee: M25.561

## 2015-02-10 HISTORY — DX: Opioid use, unspecified, uncomplicated: F11.90

## 2015-02-10 HISTORY — DX: Dyskinesia of esophagus: K22.4

## 2015-02-10 HISTORY — DX: Dysphagia, unspecified: R13.10

## 2015-02-10 HISTORY — DX: Other specified health status: Z78.9

## 2015-02-10 HISTORY — DX: Hemangioma of intra-abdominal structures: D18.03

## 2015-02-10 HISTORY — DX: Other cervical disc degeneration, unspecified cervical region: M50.30

## 2015-02-10 SURGERY — CARPOMETACARPEL (CMC) SUSPENSION PLASTY
Anesthesia: General | Site: Wrist | Laterality: Right

## 2015-02-10 MED ORDER — PROPOFOL 10 MG/ML IV BOLUS
INTRAVENOUS | Status: DC | PRN
  Administered 2015-02-10: 200 mg via INTRAVENOUS

## 2015-02-10 MED ORDER — DEXMEDETOMIDINE HCL IN NACL 400 MCG/100ML IV SOLN
INTRAVENOUS | Status: DC | PRN
  Administered 2015-02-10: .4 ug/kg/h via INTRAVENOUS

## 2015-02-10 MED ORDER — CEFAZOLIN SODIUM-DEXTROSE 2-3 GM-% IV SOLR
2.0000 g | INTRAVENOUS | Status: AC
  Administered 2015-02-10: 2 g via INTRAVENOUS

## 2015-02-10 MED ORDER — FENTANYL CITRATE (PF) 100 MCG/2ML IJ SOLN: 25.0000 ug | INTRAMUSCULAR | Status: DC | PRN

## 2015-02-10 MED ORDER — LACTATED RINGERS IV SOLN: INTRAVENOUS | Status: DC

## 2015-02-10 MED ORDER — FENTANYL CITRATE (PF) 100 MCG/2ML IJ SOLN
INTRAMUSCULAR | Status: AC
  Filled 2015-02-10: qty 2

## 2015-02-10 MED ORDER — PHENYLEPHRINE HCL 10 MG/ML IJ SOLN
INTRAMUSCULAR | Status: DC | PRN
  Administered 2015-02-10 (×2): 80 ug via INTRAVENOUS
  Administered 2015-02-10: 40 ug via INTRAVENOUS

## 2015-02-10 MED ORDER — OXYCODONE-ACETAMINOPHEN 5-325 MG PO TABS: 1.0000 | ORAL_TABLET | Freq: Four times a day (QID) | ORAL | Status: DC | PRN

## 2015-02-10 MED ORDER — MIDAZOLAM HCL 2 MG/2ML IJ SOLN
INTRAMUSCULAR | Status: AC
  Filled 2015-02-10: qty 2

## 2015-02-10 MED ORDER — LIDOCAINE HCL (CARDIAC) 20 MG/ML IV SOLN
INTRAVENOUS | Status: AC
  Filled 2015-02-10: qty 5

## 2015-02-10 MED ORDER — 0.9 % SODIUM CHLORIDE (POUR BTL) OPTIME
TOPICAL | Status: DC | PRN
  Administered 2015-02-10: 1000 mL

## 2015-02-10 MED ORDER — MIDAZOLAM HCL 2 MG/2ML IJ SOLN
1.0000 mg | INTRAMUSCULAR | Status: DC | PRN
  Administered 2015-02-10 (×2): 2 mg via INTRAVENOUS

## 2015-02-10 MED ORDER — DIPHENHYDRAMINE HCL 50 MG/ML IJ SOLN
12.5000 mg | Freq: Once | INTRAMUSCULAR | Status: AC
  Administered 2015-02-10: 12.5 mg via INTRAVENOUS

## 2015-02-10 MED ORDER — DIPHENHYDRAMINE HCL 50 MG/ML IJ SOLN
INTRAMUSCULAR | Status: AC
  Filled 2015-02-10: qty 1

## 2015-02-10 MED ORDER — ONDANSETRON HCL 4 MG/2ML IJ SOLN
INTRAMUSCULAR | Status: DC | PRN
  Administered 2015-02-10: 4 mg via INTRAVENOUS

## 2015-02-10 MED ORDER — FENTANYL CITRATE (PF) 100 MCG/2ML IJ SOLN
50.0000 ug | INTRAMUSCULAR | Status: DC | PRN
  Administered 2015-02-10: 50 ug via INTRAVENOUS
  Administered 2015-02-10: 100 ug via INTRAVENOUS

## 2015-02-10 MED ORDER — ONDANSETRON HCL 4 MG/2ML IJ SOLN
INTRAMUSCULAR | Status: AC
  Filled 2015-02-10: qty 2

## 2015-02-10 MED ORDER — HYDROMORPHONE HCL 2 MG PO TABS: 2.0000 mg | ORAL_TABLET | ORAL | Status: DC | PRN

## 2015-02-10 MED ORDER — LACTATED RINGERS IV SOLN
INTRAVENOUS | Status: DC
  Administered 2015-02-10 (×2): via INTRAVENOUS

## 2015-02-10 MED ORDER — GLYCOPYRROLATE 0.2 MG/ML IJ SOLN
0.2000 mg | Freq: Once | INTRAMUSCULAR | Status: AC | PRN
  Administered 2015-02-10: 0.2 mg via INTRAVENOUS

## 2015-02-10 MED ORDER — DIPHENHYDRAMINE HCL 50 MG/ML IJ SOLN
INTRAMUSCULAR | Status: DC | PRN
  Administered 2015-02-10 (×2): 12.5 mg via INTRAVENOUS

## 2015-02-10 MED ORDER — CEFAZOLIN SODIUM-DEXTROSE 2-3 GM-% IV SOLR
INTRAVENOUS | Status: AC
  Filled 2015-02-10: qty 50

## 2015-02-10 MED ORDER — DEXAMETHASONE SODIUM PHOSPHATE 10 MG/ML IJ SOLN
INTRAMUSCULAR | Status: AC
  Filled 2015-02-10: qty 1

## 2015-02-10 MED ORDER — ONDANSETRON HCL 4 MG/2ML IJ SOLN: 4.0000 mg | Freq: Once | INTRAMUSCULAR | Status: DC | PRN

## 2015-02-10 MED ORDER — LIDOCAINE HCL (CARDIAC) 20 MG/ML IV SOLN
INTRAVENOUS | Status: DC | PRN
  Administered 2015-02-10: 60 mg via INTRAVENOUS

## 2015-02-10 MED ORDER — DEXMEDETOMIDINE HCL IN NACL 200 MCG/50ML IV SOLN
INTRAVENOUS | Status: AC
  Filled 2015-02-10: qty 50

## 2015-02-10 MED ORDER — PROPOFOL 10 MG/ML IV BOLUS
INTRAVENOUS | Status: AC
  Filled 2015-02-10: qty 20

## 2015-02-10 MED ORDER — DEXAMETHASONE SODIUM PHOSPHATE 4 MG/ML IJ SOLN
INTRAMUSCULAR | Status: DC | PRN
  Administered 2015-02-10: 10 mg via INTRAVENOUS

## 2015-02-10 MED ORDER — SCOPOLAMINE 1 MG/3DAYS TD PT72: 1.0000 | MEDICATED_PATCH | Freq: Once | TRANSDERMAL | Status: DC

## 2015-02-10 MED ORDER — LACTATED RINGERS IV SOLN
INTRAVENOUS | Status: DC
  Administered 2015-02-10: 14:00:00 via INTRAVENOUS

## 2015-02-10 MED ORDER — GLYCOPYRROLATE 0.2 MG/ML IJ SOLN: 0.2000 mg | Freq: Once | INTRAMUSCULAR | Status: DC | PRN

## 2015-02-10 SURGICAL SUPPLY — 56 items
BIT DRILL 11/64XX180123XX4 (BIT) ×2
BIT DRILL 11/64XX180123XX4.3 (BIT) ×2 IMPLANT
BLADE AVERAGE 25X9 (BLADE) IMPLANT
BLADE MINI RND TIP GREEN BEAV (BLADE) IMPLANT
BLADE SURG 15 STRL LF DISP TIS (BLADE) ×2 IMPLANT
BLADE SURG 15 STRL SS (BLADE) ×1
BNDG COHESIVE 4X5 TAN STRL (GAUZE/BANDAGES/DRESSINGS) ×3 IMPLANT
BNDG ESMARK 4X9 LF (GAUZE/BANDAGES/DRESSINGS) ×3 IMPLANT
BNDG GAUZE ELAST 4 BULKY (GAUZE/BANDAGES/DRESSINGS) ×6 IMPLANT
CHLORAPREP W/TINT 26ML (MISCELLANEOUS) IMPLANT
CORDS BIPOLAR (ELECTRODE) ×3 IMPLANT
COVER BACK TABLE 60X90IN (DRAPES) ×3 IMPLANT
COVER MAYO STAND STRL (DRAPES) ×3 IMPLANT
CUFF TOURNIQUET SINGLE 18IN (TOURNIQUET CUFF) ×3 IMPLANT
DECANTER SPIKE VIAL GLASS SM (MISCELLANEOUS) IMPLANT
DRAPE EXTREMITY T 121X128X90 (DRAPE) ×3 IMPLANT
DRAPE SURG 17X23 STRL (DRAPES) ×3 IMPLANT
DRILL BIT 1/8DIAX5INL DISPOSE (BIT) ×3 IMPLANT
DRILL BIT 11/64XX180123XX4.3 (BIT) ×1
DRSG EMULSION OIL 3X3 NADH (GAUZE/BANDAGES/DRESSINGS) ×3 IMPLANT
GAUZE SPONGE 4X4 12PLY STRL (GAUZE/BANDAGES/DRESSINGS) ×3 IMPLANT
GLOVE BIO SURGEON STRL SZ7.5 (GLOVE) IMPLANT
GLOVE BIOGEL PI IND STRL 7.0 (GLOVE) IMPLANT
GLOVE BIOGEL PI IND STRL 8 (GLOVE) ×2 IMPLANT
GLOVE BIOGEL PI INDICATOR 7.0 (GLOVE)
GLOVE BIOGEL PI INDICATOR 8 (GLOVE) ×1
GLOVE ECLIPSE 6.5 STRL STRAW (GLOVE) ×3 IMPLANT
GLOVE SURG SS PI 6.5 STRL IVOR (GLOVE) ×3 IMPLANT
GLOVE SURG SS PI 7.0 STRL IVOR (GLOVE) ×9 IMPLANT
GLOVE SURG SS PI 7.5 STRL IVOR (GLOVE) ×3 IMPLANT
GOWN STRL REUS W/ TWL LRG LVL3 (GOWN DISPOSABLE) ×4 IMPLANT
GOWN STRL REUS W/TWL LRG LVL3 (GOWN DISPOSABLE) ×2
GOWN STRL REUS W/TWL XL LVL3 (GOWN DISPOSABLE) ×3 IMPLANT
K-WIRE .062X4 (WIRE) ×3 IMPLANT
LOOP VESSEL MAXI BLUE (MISCELLANEOUS) IMPLANT
LOOP VESSEL MINI RED (MISCELLANEOUS) IMPLANT
NEEDLE HYPO 25X1 1.5 SAFETY (NEEDLE) IMPLANT
NS IRRIG 1000ML POUR BTL (IV SOLUTION) ×3 IMPLANT
PACK BASIN DAY SURGERY FS (CUSTOM PROCEDURE TRAY) ×3 IMPLANT
PADDING CAST ABS 4INX4YD NS (CAST SUPPLIES) ×1
PADDING CAST ABS COTTON 4X4 ST (CAST SUPPLIES) ×2 IMPLANT
SHEET MEDIUM DRAPE 40X70 STRL (DRAPES) IMPLANT
SLEEVE SCD COMPRESS KNEE MED (MISCELLANEOUS) IMPLANT
SPLINT PLASTER CAST XFAST 3X15 (CAST SUPPLIES) ×14 IMPLANT
SPLINT PLASTER XTRA FASTSET 3X (CAST SUPPLIES) ×7
STOCKINETTE 6  STRL (DRAPES) ×1
STOCKINETTE 6 STRL (DRAPES) ×2 IMPLANT
SUT ETHIBOND 2-0 V-5 NEEDLE (SUTURE) IMPLANT
SUT ETHIBOND 3-0 V-5 (SUTURE) ×6 IMPLANT
SUT STEEL 4 (SUTURE) ×3 IMPLANT
SUT VICRYL RAPIDE 4-0 (SUTURE) ×3 IMPLANT
SUT VICRYL RAPIDE 4/0 PS 2 (SUTURE) ×3 IMPLANT
SYR BULB 3OZ (MISCELLANEOUS) ×3 IMPLANT
SYRINGE 10CC LL (SYRINGE) IMPLANT
TOWEL OR 17X24 6PK STRL BLUE (TOWEL DISPOSABLE) ×3 IMPLANT
UNDERPAD 30X30 (UNDERPADS AND DIAPERS) ×3 IMPLANT

## 2015-02-10 NOTE — Interval H&P Note (Signed)
History and Physical Interval Note:  02/10/2015 11:12 AM  Kari Green  has presented today for surgery, with the diagnosis of RIGHT THUMB ARTHRITIS; VOLAR WRIST GANGLION, AND ULNAR PAIN M18.11, M67.431, M25.531  The various methods of treatment have been discussed with the patient and family. After consideration of risks, benefits and other options for treatment, the patient has consented to  Procedure(s) with comments: RIGHT THUMB SUSPENSION  ARTHROPLASTY, VOLAR GANGLION EXCISION AND PARTIAL ULNAR EXCISION (Right) - ANESTHESIA: PRE- OP BLOCK as a surgical intervention .  The patient's history has been reviewed, patient examined, no change in status, stable for surgery.  I have reviewed the patient's chart and labs.  Questions were answered to the patient's satisfaction.     [name]

## 2015-02-10 NOTE — Discharge Instructions (Signed)
Discharge Instructions ° ° °You have a dressing with a plaster splint incorporated in it. °Move your fingers as much as possible, making a full fist and fully opening the fist. °Elevate your hand to reduce pain & swelling of the digits.  Ice over the operative site may be helpful to reduce pain & swelling.  DO NOT USE HEAT. °Pain medicine has been prescribed for you.  °Use your medicine as needed over the first 48 hours, and then you can begin to taper your use.  You may use Tylenol in place of your prescribed pain medication, but not IN ADDITION to it. °Leave the dressing in place until you return to our office.  °You may shower, but keep the bandage clean & dry.  °You may drive a car when you are off of prescription pain medications and can safely control your vehicle with both hands. °Our office will call you to arrange follow-up ° ° °Please call [phone] during normal business hours or [phone] after hours for any problems. Including the following: ° °- excessive redness of the incisions °- drainage for more than 4 days °- fever of more than 101.5 F ° °*Please note that pain medications will not be refilled after hours or on weekends. ° ° °Post Anesthesia Home Care Instructions ° °Activity: °Get plenty of rest for the remainder of the day. A responsible adult should stay with you for 24 hours following the procedure.  °For the next 24 hours, DO NOT: °-Drive a car °-Operate machinery °-Drink alcoholic beverages °-Take any medication unless instructed by your physician °-Make any legal decisions or sign important papers. ° °Meals: °Start with liquid foods such as gelatin or soup. Progress to regular foods as tolerated. Avoid greasy, spicy, heavy foods. If nausea and/or vomiting occur, drink only clear liquids until the nausea and/or vomiting subsides. Call your physician if vomiting continues. ° °Special Instructions/Symptoms: °Your throat may feel dry or sore from the anesthesia or the breathing tube  placed in your throat during surgery. If this causes discomfort, gargle with warm salt water. The discomfort should disappear within 24 hours. ° °If you had a scopolamine patch placed behind your ear for the management of post- operative nausea and/or vomiting: ° °1. The medication in the patch is effective for 72 hours, after which it should be removed.  Wrap patch in a tissue and discard in the trash. Wash hands thoroughly with soap and water. °2. You may remove the patch earlier than 72 hours if you experience unpleasant side effects which may include dry mouth, dizziness or visual disturbances. °3. Avoid touching the patch. Wash your hands with soap and water after contact with the patch. °  °Regional Anesthesia Blocks ° °1. Numbness or the inability to move the "blocked" extremity may last from 3-48 hours after placement. The length of time depends on the medication injected and your individual response to the medication. If the numbness is not going away after 48 hours, call your surgeon. ° °2. The extremity that is blocked will need to be protected until the numbness is gone and the  Strength has returned. Because you cannot feel it, you will need to take extra care to avoid injury. Because it may be weak, you may have difficulty moving it or using it. You may not know what position it is in without looking at it while the block is in effect. ° °3. For blocks in the legs and feet, returning to weight bearing and walking needs to be   to be done carefully. You will need to wait until the numbness is entirely gone and the strength has returned. You should be able to move your leg and foot normally before you try and bear weight or walk. You will need someone to be with you when you first try to ensure you do not fall and possibly risk injury.  4. Bruising and tenderness at the needle site are common side effects and will resolve in a few days.  5. Persistent numbness or new problems with movement should be  communicated to the surgeon or the [place] [phone] [place] [phone]).   Call your surgeon if you experience:   1.  Fever over 101.0. 2.  Inability to urinate. 3.  Nausea and/or vomiting. 4.  Extreme swelling or bruising at the surgical site. 5.  Continued bleeding from the incision. 6.  Increased pain, redness or drainage from the incision. 7.  Problems related to your pain medication. 8. Any change in color, movement and/or sensation 9. Any problems and/or concerns

## 2015-02-10 NOTE — Anesthesia Procedure Notes (Addendum)
Anesthesia Regional Block:  Supraclavicular block  Pre-Anesthetic Checklist: ,, timeout performed, Correct Patient, Correct Site, Correct Laterality, Correct Procedure, Correct Position, site marked, Risks and benefits discussed,  Surgical consent,  Pre-op evaluation,  At surgeon's request and post-op pain management  Laterality: Right  Prep: Betadine       Needles:  Injection technique: Single-shot  Needle Type: Echogenic Stimulator Needle     Needle Length: 9cm 9 cm Needle Gauge: 21 and 21 G    Additional Needles:  Procedures: ultrasound guided (picture in chart) Supraclavicular block Narrative:  Start time: 02/10/2015 10:40 AM End time: 02/10/2015 10:45 AM Injection made incrementally with aspirations every 5 mL.  Performed by: Personally   Additional Notes: 30 cc 0.5% Bupivacaine with 1:200 epi injected easily   Procedure Name: LMA Insertion Date/Time: 02/10/2015 11:30 AM Performed by: Lyndee Leo Pre-anesthesia Checklist: Patient identified, Emergency Drugs available, Suction available and Patient being monitored Patient Re-evaluated:Patient Re-evaluated prior to inductionOxygen Delivery Method: Circle System Utilized Preoxygenation: Pre-oxygenation with 100% oxygen Intubation Type: IV induction and Inhalational induction Ventilation: Mask ventilation without difficulty LMA: LMA inserted LMA Size: 4.0 Number of attempts: 1 Airway Equipment and Method: Bite block Placement Confirmation: positive ETCO2 Tube secured with: Tape Dental Injury: Teeth and Oropharynx as per pre-operative assessment

## 2015-02-10 NOTE — Anesthesia Preprocedure Evaluation (Signed)
Anesthesia Evaluation  Patient identified by MRN, date of birth, ID band Patient awake    Reviewed: Allergy & Precautions, NPO status , Patient's Chart, lab work & pertinent test results  Airway   TM Distance: >3 FB Neck ROM: Full    Dental  (+) Teeth Intact, Dental Advisory Given   Pulmonary former smoker,    breath sounds clear to auscultation       Cardiovascular  Rhythm:Regular Rate:Normal     Neuro/Psych    GI/Hepatic   Endo/Other    Renal/GU      Musculoskeletal   Abdominal   Peds  Hematology   Anesthesia Other Findings   Reproductive/Obstetrics                             Anesthesia Physical Anesthesia Plan  ASA: II  Anesthesia Plan: General   Post-op Pain Management: GA combined w/ Regional for post-op pain   Induction: Intravenous  Airway Management Planned: LMA  Additional Equipment:   Intra-op Plan:   Post-operative Plan:   Informed Consent: I have reviewed the patients History and Physical, chart, labs and discussed the procedure including the risks, benefits and alternatives for the proposed anesthesia with the patient or authorized representative who has indicated his/her understanding and acceptance.     Plan Discussed with: CRNA and Anesthesiologist  Anesthesia Plan Comments:         Anesthesia Quick Evaluation

## 2015-02-10 NOTE — Op Note (Signed)
02/10/2015  11:13 AM  PATIENT:  Kari Green  49 y.o. female  PRE-OPERATIVE DIAGNOSIS:  Right volar wrist ganglion, TMC osteoarthritis, and ulnar sided wrist pain  POST-OPERATIVE DIAGNOSIS:  Same  PROCEDURE:   1. Right thumb suspension arthroplasty     2. Right volar wrist ganglion excision    3. Right partial distal ulna excision  SURGEON: Rayvon Char. Grandville Silos, MD  PHYSICIAN ASSISTANT: Morley Kos, OPA-C   ANESTHESIA:  regional and general  SPECIMENS:  None  DRAINS:   None  EBL:  less than 50 mL  PREOPERATIVE INDICATIONS:  Kari Green is a  49 y.o. female with  pain at the base of the right thumb having undergone previous MP fusion, ulnar sided wrist pain over a very prominent distal ulnar styloid, and a volar radial wrist mass consistent with a ganglion. The risks benefits and alternatives were discussed with the patient preoperatively including but not limited to the risks of infection, bleeding, nerve injury, cardiopulmonary complications, the need for revision surgery, among others, and the patient verbalized understanding and consented to proceed.  OPERATIVE IMPLANNONE OPERATIVE PROCEDURE:  After receiving prophylactic antibiotics and a regional block , the patient was escorted to the operative theatre and placed in a supine position.  general anesthesia was administered.  A surgical "time-out" was performed during which the planned procedure, proposed operative site, and the correct patient identity were compared to the operative consent and agreement confirmed by the circulating nurse according to current facility policy.  Following application of a tourniquet to the operative extremity, the exposed skin was prepped with Chloraprep and draped in the usual sterile fashion.  The limb was exsanguinated with an Esmarch bandage and the tourniquet inflated to approximately 174mmHg higher than systolic BP.   A ttypical Wagner incision was marked, then extended  proximally in a zigzag. The skin was incised sharply With a scalpel, subcutaneous tissues dissected with blunt and spreading dissection. The volar radial wrist ganglion was encountered first. The radial artery was plastered against it and this was dissected off of it. The ganglion perforated in the course of the dissection, but ultimately all of the ganglion tissue was excised. It appeared to have a stalk from the volar radial wrist capsule  With the ganglion excised, attention was directed to the thumb suspension arthroplasty where the thenar muscles were reflected extra periosteally and the capsule incised along the volar edge of the APL. The joint was inspected, found to be arthritic with very thin if any cartilage remaining in a couple spots. The trapezium was excised piecemeal. The base of the thumb metacarpal articular surface was resected with a saw and a tunnel made in the base with successive drill bits for passage of the tendon reconstruction. A half strip of FCR tendon was harvested, leaving it distally based all the way to the base of the second metacarpal. It was brought through the drill hole in the first metacarpal, back down through the capsule and tied and a half hitch upon the intact portion of tendon. This was done with the metacarpal neither distracted nor compressed. This knot was secured with 3-0 Ethibond suture. The tendon was also secured the dorsal periosteum with the same type stitch. The resection space was irrigated and the remainder of the tendon graft fanfolded and secured with suture into the base of the resection space. 3-0 Ethibond suture was then used to reapproximate the capsule, securing the interposition material and closing the thenar muscles to the APL border. Attention  was then directed to the ulnar side of the wrist where a curvilinear incision was made over the prominent distal ulna. The skin was reflected, and the retinaculum incised. The extensor apparatus of sheath and  periosteum complex was incised and reflected off the bony prominence in the prominence was excised and smoothed with a rongeur. The wound was irrigated, the periosteal tissue reapproximated with the 3-0 Ethibond suture followed by closure of the retinaculum with 4-0 Vicryl Rapide running suture. Tourniquet was released and additional hemostasis obtained where necessary. The skin was all closed with 4-0 Vicryl Rapide combination of interrupted running sutures. A short arm thumb spica splint dressing with a plaster component was applied and she was taken to the recovery room stable condition, breathing spontaneously  DISPOSITION: She'll be discharged home returning in approximately 2 weeks to have her splint dressing removed, with a follow-up hand therapy appointment for custom splint fabrication and initiation of rehabilitation

## 2015-02-10 NOTE — Anesthesia Postprocedure Evaluation (Signed)
Anesthesia Post Note  Patient: Kari Green  Procedure(s) Performed: Procedure(s) (LRB): RIGHT THUMB SUSPENSION  ARTHROPLASTY (Right) REMOVAL GANGLION OF WRIST, PARTIAL ULNAR EXCISION (Right)  Patient location during evaluation: PACU Anesthesia Type: General Level of consciousness: awake and alert Pain management: pain level controlled Vital Signs Assessment: post-procedure vital signs reviewed and stable Respiratory status: spontaneous breathing, nonlabored ventilation and respiratory function stable Cardiovascular status: blood pressure returned to baseline and stable Postop Assessment: no signs of nausea or vomiting Anesthetic complications: no    Last Vitals:  Filed Vitals:   02/10/15 1430 02/10/15 1450  BP: 124/82 135/79  Pulse: 52 62  Resp: 16 16    Last Pain:  Filed Vitals:   02/10/15 1454  PainSc: 0-No pain                 Trevell Pariseau A

## 2015-02-10 NOTE — Progress Notes (Signed)
Assisted Dr. Joslin with right, ultrasound guided, supraclavicular block. Side rails up, monitors on throughout procedure. See vital signs in flow sheet. Tolerated Procedure well. 

## 2015-02-10 NOTE — Transfer of Care (Signed)
Immediate Anesthesia Transfer of Care Note  Patient: Kari Green  Procedure(s) Performed: Procedure(s) with comments: RIGHT THUMB SUSPENSION  ARTHROPLASTY (Right) - ANESTHESIA: PRE- OP BLOCK REMOVAL GANGLION OF WRIST, PARTIAL ULNAR EXCISION (Right)  Patient Location: PACU  Anesthesia Type:GA combined with regional for post-op pain  Level of Consciousness: sedated, lethargic and responds to stimulation  Airway & Oxygen Therapy: Patient Spontanous Breathing and Patient connected to face mask oxygen  Post-op Assessment: Report given to RN and Post -op Vital signs reviewed and stable  Post vital signs: Reviewed and stable  Last Vitals:  Filed Vitals:   02/10/15 1050 02/10/15 1100  BP:  97/66  Pulse: 67 80  Resp: 12 18    Complications: No apparent anesthesia complications

## 2015-02-11 ENCOUNTER — Encounter (HOSPITAL_BASED_OUTPATIENT_CLINIC_OR_DEPARTMENT_OTHER): Payer: Self-pay | Admitting: Orthopedic Surgery

## 2015-02-13 ENCOUNTER — Ambulatory Visit (INDEPENDENT_AMBULATORY_CARE_PROVIDER_SITE_OTHER): Payer: BLUE CROSS/BLUE SHIELD

## 2015-02-13 DIAGNOSIS — J309 Allergic rhinitis, unspecified: Secondary | ICD-10-CM

## 2015-02-25 DIAGNOSIS — J209 Acute bronchitis, unspecified: Secondary | ICD-10-CM | POA: Diagnosis not present

## 2015-02-25 DIAGNOSIS — E538 Deficiency of other specified B group vitamins: Secondary | ICD-10-CM | POA: Diagnosis not present

## 2015-02-26 ENCOUNTER — Ambulatory Visit: Payer: Self-pay | Admitting: Neurology

## 2015-03-04 DIAGNOSIS — G894 Chronic pain syndrome: Secondary | ICD-10-CM | POA: Diagnosis not present

## 2015-03-04 DIAGNOSIS — M545 Low back pain: Secondary | ICD-10-CM | POA: Diagnosis not present

## 2015-03-04 DIAGNOSIS — M5441 Lumbago with sciatica, right side: Secondary | ICD-10-CM | POA: Diagnosis not present

## 2015-03-04 DIAGNOSIS — M542 Cervicalgia: Secondary | ICD-10-CM | POA: Diagnosis not present

## 2015-03-04 DIAGNOSIS — G541 Lumbosacral plexus disorders: Secondary | ICD-10-CM | POA: Diagnosis not present

## 2015-03-04 DIAGNOSIS — M5412 Radiculopathy, cervical region: Secondary | ICD-10-CM | POA: Diagnosis not present

## 2015-03-04 DIAGNOSIS — G603 Idiopathic progressive neuropathy: Secondary | ICD-10-CM | POA: Diagnosis not present

## 2015-03-04 DIAGNOSIS — G890 Central pain syndrome: Secondary | ICD-10-CM | POA: Diagnosis not present

## 2015-03-05 DIAGNOSIS — E538 Deficiency of other specified B group vitamins: Secondary | ICD-10-CM | POA: Diagnosis not present

## 2015-03-06 ENCOUNTER — Ambulatory Visit: Payer: Self-pay | Admitting: Pain Medicine

## 2015-03-11 ENCOUNTER — Encounter: Payer: Self-pay | Admitting: Pain Medicine

## 2015-03-11 ENCOUNTER — Ambulatory Visit: Payer: BLUE CROSS/BLUE SHIELD | Attending: Pain Medicine | Admitting: Pain Medicine

## 2015-03-11 VITALS — BP 138/72 | HR 83 | Temp 98.4°F | Resp 16 | Ht 63.0 in | Wt 125.0 lb

## 2015-03-11 DIAGNOSIS — K581 Irritable bowel syndrome with constipation: Secondary | ICD-10-CM | POA: Insufficient documentation

## 2015-03-11 DIAGNOSIS — G90521 Complex regional pain syndrome I of right lower limb: Secondary | ICD-10-CM | POA: Insufficient documentation

## 2015-03-11 DIAGNOSIS — E039 Hypothyroidism, unspecified: Secondary | ICD-10-CM | POA: Insufficient documentation

## 2015-03-11 DIAGNOSIS — M79604 Pain in right leg: Secondary | ICD-10-CM | POA: Insufficient documentation

## 2015-03-11 DIAGNOSIS — J449 Chronic obstructive pulmonary disease, unspecified: Secondary | ICD-10-CM | POA: Diagnosis not present

## 2015-03-11 DIAGNOSIS — G8929 Other chronic pain: Secondary | ICD-10-CM | POA: Insufficient documentation

## 2015-03-11 DIAGNOSIS — M25512 Pain in left shoulder: Secondary | ICD-10-CM | POA: Insufficient documentation

## 2015-03-11 DIAGNOSIS — M25561 Pain in right knee: Secondary | ICD-10-CM | POA: Diagnosis not present

## 2015-03-11 DIAGNOSIS — Z96651 Presence of right artificial knee joint: Secondary | ICD-10-CM

## 2015-03-11 DIAGNOSIS — M545 Low back pain: Secondary | ICD-10-CM | POA: Diagnosis not present

## 2015-03-11 DIAGNOSIS — M79643 Pain in unspecified hand: Secondary | ICD-10-CM | POA: Diagnosis present

## 2015-03-11 DIAGNOSIS — F411 Generalized anxiety disorder: Secondary | ICD-10-CM | POA: Diagnosis not present

## 2015-03-11 DIAGNOSIS — F1190 Opioid use, unspecified, uncomplicated: Secondary | ICD-10-CM | POA: Insufficient documentation

## 2015-03-11 DIAGNOSIS — M25569 Pain in unspecified knee: Secondary | ICD-10-CM | POA: Diagnosis present

## 2015-03-11 DIAGNOSIS — R1013 Epigastric pain: Secondary | ICD-10-CM | POA: Insufficient documentation

## 2015-03-11 MED ORDER — LACTATED RINGERS IV SOLN: 1000.0000 mL | INTRAVENOUS | Status: AC

## 2015-03-11 MED ORDER — PROMETHAZINE HCL 25 MG/ML IJ SOLN
INTRAMUSCULAR | Status: AC
  Administered 2015-03-11: 25 mg via INTRAVENOUS
  Filled 2015-03-11: qty 1

## 2015-03-11 MED ORDER — MIDAZOLAM HCL 5 MG/5ML IJ SOLN
INTRAMUSCULAR | Status: AC
  Administered 2015-03-11: 4 mg via INTRAVENOUS
  Filled 2015-03-11: qty 5

## 2015-03-11 MED ORDER — DIPHENHYDRAMINE HCL 50 MG/ML IJ SOLN
50.0000 mg | Freq: Once | INTRAMUSCULAR | Status: AC
  Administered 2015-03-11: 25 mg via INTRAVENOUS

## 2015-03-11 MED ORDER — ROPIVACAINE HCL 2 MG/ML IJ SOLN
INTRAMUSCULAR | Status: AC
  Filled 2015-03-11: qty 10

## 2015-03-11 MED ORDER — DIPHENHYDRAMINE HCL 50 MG/ML IJ SOLN
INTRAMUSCULAR | Status: AC
  Administered 2015-03-11: 25 mg via INTRAVENOUS
  Filled 2015-03-11: qty 1

## 2015-03-11 MED ORDER — CEFAZOLIN SODIUM 1 G IJ SOLR
1.0000 g | INTRAMUSCULAR | Status: AC
  Administered 2015-03-11: 1 g

## 2015-03-11 MED ORDER — PROMETHAZINE HCL 25 MG/ML IJ SOLN
25.0000 mg | Freq: Once | INTRAMUSCULAR | Status: DC | PRN
  Administered 2015-03-11: 25 mg via INTRAVENOUS

## 2015-03-11 MED ORDER — SODIUM CHLORIDE 0.9 % IJ SOLN
INTRAMUSCULAR | Status: AC
Start: 2015-03-11 — End: 2015-03-11
  Administered 2015-03-11: 09:00:00
  Filled 2015-03-11: qty 20

## 2015-03-11 MED ORDER — FENTANYL CITRATE (PF) 100 MCG/2ML IJ SOLN
INTRAMUSCULAR | Status: AC
  Filled 2015-03-11: qty 2

## 2015-03-11 MED ORDER — ROPIVACAINE HCL 2 MG/ML IJ SOLN
10.0000 mL | Freq: Once | INTRAMUSCULAR | Status: AC
  Administered 2015-03-11: 09:00:00

## 2015-03-11 MED ORDER — CEFAZOLIN SODIUM 1 G IJ SOLR
INTRAMUSCULAR | Status: AC
  Administered 2015-03-11: 1 g
  Filled 2015-03-11: qty 10

## 2015-03-11 MED ORDER — LIDOCAINE HCL (PF) 1 % IJ SOLN: 10.0000 mL | Freq: Once | INTRAMUSCULAR | Status: DC

## 2015-03-11 MED ORDER — FENTANYL CITRATE (PF) 100 MCG/2ML IJ SOLN: 100.0000 ug | Freq: Once | INTRAMUSCULAR | Status: DC

## 2015-03-11 MED ORDER — MIDAZOLAM HCL 5 MG/5ML IJ SOLN
5.0000 mg | Freq: Once | INTRAMUSCULAR | Status: AC
  Administered 2015-03-11: 4 mg via INTRAVENOUS

## 2015-03-11 NOTE — Progress Notes (Signed)
Additional Benadryl 25mg  not given -no c/o itching or swelling Additional Phenergan 25mg  not given - no nausea noted

## 2015-03-11 NOTE — Patient Instructions (Signed)
Radiofrequency Lesioning Radiofrequency lesioning is a procedure that is performed to relieve pain. The procedure is often used for back, neck, or arm pain. Radiofrequency lesioning involves the use of a machine that creates radio waves to make heat. During the procedure, the heat is applied to the nerve that carries the pain signal. The heat damages the nerve and interferes with the pain signal. Pain relief usually lasts for 6 months to 1 year. LET [facility] CARE PROVIDER KNOW ABOUT:  Any allergies you have.  All medicines you are taking, including vitamins, herbs, eye drops, creams, and over-the-counter medicines.  Previous problems you or members of your family have had with the use of anesthetics.  Any blood disorders you have.  Previous surgeries you have had.  Any medical conditions you have.  Whether you are pregnant or may be pregnant. RISKS AND COMPLICATIONS Generally, this is a safe procedure. However, problems may occur, including:  Pain or soreness at the injection site.  Infection at the injection site.  Damage to nerves or blood vessels. BEFORE THE PROCEDURE  Ask your health care provider about:  Changing or stopping your regular medicines. This is especially important if you are taking diabetes medicines or blood thinners.  Taking medicines such as aspirin and ibuprofen. These medicines can thin your blood. Do not take these medicines before your procedure if your health care provider instructs you not to.  Follow instructions from your health care provider about eating or drinking restrictions.  Plan to have someone take you home after the procedure.  If you go home right after the procedure, plan to have someone with you for 24 hours. PROCEDURE  You will be given one or more of the following:  A medicine to help you relax (sedative).  A medicine to numb the area (local anesthetic).  You will be awake during the procedure. You will need to be able to  talk with the health care provider during the procedure.  With the help of a type of X-ray (fluoroscopy), the health care provider will insert a radiofrequency needle into the area to be treated.  Next, a wire that carries the radio waves (electrode) will be put through the radiofrequency needle. An electrical pulse will be sent through the electrode to verify the correct nerve. You will feel a tingling sensation, and you may have muscle twitching.  Then, the tissue that is around the needle tip will be heated by an electric current that is passed using the radiofrequency machine. This will numb the nerves.  A bandage (dressing) will be put on the insertion area after the procedure is done. The procedure may vary among health care providers and hospitals. AFTER THE PROCEDURE  Your blood pressure, heart rate, breathing rate, and blood oxygen level will be monitored often until the medicines you were given have worn off.  Return to your normal activities as directed by your health care provider.   This information is not intended to replace advice given to you by your health care provider. Make sure you discuss any questions you have with your health care provider.   Document Released: 10/07/2010 Document Revised: 10/30/2014 Document Reviewed: 03/18/2014 Elsevier Interactive Patient Education 2016 [employer] Knee Injection A knee injection is a procedure to get medicine into your knee joint. Your health care provider puts a needle into the joint and injects medicine with an attached syringe. The injected medicine may relieve the pain, swelling, and stiffness of arthritis. The injected medicine may also help  to lubricate and cushion your knee joint. You may need more than one injection. LET [facility] CARE PROVIDER KNOW ABOUT:  Any allergies you have.  All medicines you are taking, including vitamins, herbs, eye drops, creams, and over-the-counter medicines.  Previous problems you or  members of your family have had with the use of anesthetics.  Any blood disorders you have.  Previous surgeries you have had.  Any medical conditions you may have. RISKS AND COMPLICATIONS Generally, this is a safe procedure. However, problems may occur, including:  Infection.  Bleeding.  Worsening symptoms.  Damage to the area around your knee.  Allergic reaction to any of the medicines.  Skin reactions from repeated injections. BEFORE THE PROCEDURE  Ask your health care provider about changing or stopping your regular medicines. This is especially important if you are taking diabetes medicines or blood thinners.  Plan to have someone take you home after the procedure. PROCEDURE  You will sit or lie down in a position for your knee to be treated.  The skin over your kneecap will be cleaned with a germ-killing solution (antiseptic).  You will be given a medicine that numbs the area (local anesthetic). You may feel some stinging.  After your knee becomes numb, you will have a second injection. This is the medicine. This needle is carefully placed between your kneecap and your knee. The medicine is injected into the joint space.  At the end of the procedure, the needle will be removed.  A bandage (dressing) may be placed over the injection site. The procedure may vary among health care providers and hospitals. AFTER THE PROCEDURE  You may have to move your knee through its full range of motion. This helps to get all of the medicine into your joint space.  Your blood pressure, heart rate, breathing rate, and blood oxygen level will be monitored often until the medicines you were given have worn off.  You will be watched to make sure that you do not have a reaction to the injected medicine.   This information is not intended to replace advice given to you by your health care provider. Make sure you discuss any questions you have with your health care provider.   Document  Released: 05/02/2006 Document Revised: 03/01/2014 Document Reviewed: 12/19/2013 Elsevier Interactive Patient Education 2016 [employer] Pain Management Discharge Instructions  General Discharge Instructions :  If you need to reach your doctor call: Monday-Friday 8:00 am - 4:00 pm at [phone] or toll free [phone].  After clinic hours [phone] to have operator reach doctor.  Bring all of your medication bottles to all your appointments in the pain clinic.  To cancel or reschedule your appointment with Pain Management please remember to call 24 hours in advance to avoid a fee.  Refer to the educational materials which you have been given on: General Risks, I had my Procedure. Discharge Instructions, Post Sedation.  Post Procedure Instructions:  The drugs you were given will stay in your system until tomorrow, so for the next 24 hours you should not drive, make any legal decisions or drink any alcoholic beverages.  You may eat anything you prefer, but it is better to start with liquids then soups and crackers, and gradually work up to solid foods.  Please notify your doctor immediately if you have any unusual bleeding, trouble breathing or pain that is not related to your normal pain.  Depending on the type of procedure that was done, some parts of your body  may feel week and/or numb.  This usually clears up by tonight or the next day.  Walk with the use of an assistive device or accompanied by an adult for the 24 hours.  You may use ice on the affected area for the first 24 hours.  Put ice in a Ziploc bag and cover with a towel and place against area 15 minutes on 15 minutes off.  You may switch to heat after 24 hours.

## 2015-03-12 ENCOUNTER — Telehealth: Payer: Self-pay | Admitting: *Deleted

## 2015-03-12 NOTE — Telephone Encounter (Signed)
Spoke with patient, verbalizes no complications from procedure on yesterday. 

## 2015-03-12 NOTE — Progress Notes (Signed)
Patient's Name: Kari Green MRN: [id number] DOB: 05/09/1965 DOS: 03/11/2015  Primary Reason(s) for Visit: Interventional Pain Management Treatment. CC: Knee Pain and Hand Pain   Pre-Procedure Assessment:  Kari Green is a 50 y.o. year old, female patient, seen today for interventional treatment. She has Irritable bowel syndrome with constipation; Abdominal pain, epigastric; Abdominal adhesions; COPD (chronic obstructive pulmonary disease) ([place]); Hypothyroidism; Left shoulder pain; Memory deficits; Asthma; Allergic rhinitis; Chronic low back pain; CRPS (complex regional pain syndrome) type I of lower limb (Right-sided); History of arthroplasty of right knee; Chronic pain syndrome; Chronic pain; Chronic right knee pain; Generalized anxiety disorder; Chronic pain of right lower extremity; Multiple drug allergies (codeine, Lidoderm a DC if, sulfa, latex, IVP dye, Keflex, Augmentin); Latex allergy; Long term current use of opiate analgesic; Long term prescription opiate use; Opiate use; Opiate dependence ([place]); and History of allergy to IVP dye on her problem list.. Her primarily concern today is the Knee Pain and Hand Pain   Reported Pain Score: 3  Reported level of pain is compatible with clinical observation Pain Type: Chronic pain Pain Location: Knee Pain Orientation: Right Pain Descriptors / Indicators: Aching, Constant, Burning, Radiating Pain Frequency: Constant  Date of Last Visit:  (last seen at [facility])    Verification of the correct person, correct site (including marking of site), and correct procedure were performed and confirmed by the patient.  Today's Vitals   03/11/15 0955 03/11/15 1000 03/11/15 1005 03/11/15 1012  BP: 105/60 99/62 101/63 138/72  Pulse: 87 85 89 83  Temp:      Resp: '16 16 18 16  ' Height:      Weight:      SpO2: 97% 97% 97% 96%  PainSc: '5  3  3  3   ' PainLoc:      Calculated BMI: Body mass index is 22.15 kg/(m^2). Allergies: She is allergic to  iodinated diagnostic agents; red dye; chloraprep one step; cobalt; corticosteroids; eggs or egg-derived products; latex; morphine and related; nickel; other; peanuts; supartz; surgical lubricant; tape; yellow dyes (non-tartrazine); flexeril; and soap.. Primary Diagnosis: Chronic pain of right knee [M25.561, G89.29]  Procedure:  Type: Palliative Superior-lateral, Superior-medial, and Inferior-medial, Genicular Nerve Radiofrequency Ablation. Region: Lateral, Anterior, and Medial aspects of the knee joint, above and below the knee joint proper. Level: Superior and inferior to the knee joint. Laterality: Right  Indications: 1. Chronic right knee pain   2. History of arthroplasty of right knee     The patient has failed to respond to conservative therapies including over-the-counter medications, anti-inflammatories, muscle relaxants, membrane stabilizers, opioids, physical therapy, modalities such as heat and ice, as well as more invasive techniques such as nerve blocks. The patient did attained more than 50% relief of the pain from a series of diagnostic injections conducted in separate occasions.  In addition, Kari Green has Abdominal pain, epigastric; Abdominal adhesions; Left shoulder pain; Chronic low back pain; CRPS (complex regional pain syndrome) type I of lower limb (Right-sided); History of arthroplasty of right knee; Chronic pain syndrome; Chronic pain; Chronic right knee pain; and Chronic pain of right lower extremity on her pertinent problem list.  Consent: Secured. Under the influence of no sedatives a written informed consent was obtained, after having provided information on the risks and possible complications. To fulfill our ethical and legal obligations, as recommended by the American Medical Association's Code of Ethics, we have provided information to the patient about our clinical impression; the nature and purpose of the treatment or procedure; the  risks, benefits, and  possible complications of the intervention; alternatives; the risk(s) and benefit(s) of the alternative treatment(s) or procedure(s); and the risk(s) and benefit(s) of doing nothing. The patient was provided information about the risks and possible complications associated with the procedure. These include, but are not limited to, failure to achieve desired goals, infection, bleeding, organ or nerve damage, allergic reactions, paralysis, and death. In the case of intra- or periarticular procedures these may include, but are not limited to, failure to achieve desired goals, infection, bleeding (hemarthrosis), organ or nerve damage, allergic reactions, and death. In addition, the patient was informed that Medicine is not an exact science; therefore, there is also the possibility of unforeseen risks and possible complications that may result in a catastrophic outcome. The patient indicated having understood very clearly. We have given the patient no guarantees and we have made no promises. Enough time was given to the patient to ask questions, all of which were answered to the patient's satisfaction.  Pre-Procedure Preparation: Safety Precautions: Allergies reviewed. Appropriate site, procedure, and patient were confirmed by following the Joint Commission's Universal Protocol (UP.01.01.01), in the form of a "Time Out". The patient was asked to confirm marked site and procedure, before commencing. The patient was asked about blood thinners, or active infections, both of which were denied. Patient was assessed for positional comfort and all pressure points were checked before starting procedure. Monitoring:  As per clinic protocol. Infection Control Precautions: Sterile technique used. Standard Universal Precautions were taken as recommended by the Department of [facility] for Disease Control and Prevention (CDC). Standard pre-surgical skin prep was conducted. Respiratory hygiene and cough etiquette was  practiced. Hand hygiene observed. Safe injection practices and needle disposal techniques followed. SDV (single dose vial) medications used. Medications properly checked for expiration dates and contaminants. Personal protective equipment (PPE) used: Sterile Radiation-resistant gloves.  Anesthesia, Analgesia, Anxiolysis:  Type: Moderate (Conscious) Sedation & Local Anesthesia Local Anesthetic: Lidocaine 1% Route: Intravenous (IV) IV Access: Secured Sedation: Meaningful verbal contact was maintained at all times during the procedure  Indication(s): Analgesia    Description of Procedure Process:  Time-out: "Time-out" completed before starting procedure, as per protocol. Position: Supine Target Area: For Genicular Nerve block(s), the targets are: the superior-lateral genicular nerve, located in the lateral distal portion of the femoral shaft as it curves to form the lateral epicondyle, in the region of the distal femoral metaphysis; the superior-medial genicular nerve, located in the medial distal portion of the femoral shaft as it curves to form the medial epicondyle; and the inferior-medial genicular nerve, located in the medial, proximal portion of the tibial shaft, as it curves to form the medial epicondyle, in the region of the proximal tibial metaphysis. Approach: Anterior, ipsilateral approach. Area Prepped: Entire knee area, from mid-thigh to mid-shin, lateral, anterior, and medial aspects. Prepping solution: ChloraPrep (2% chlorhexidine gluconate and 70% isopropyl alcohol) Safety Precautions: Aspiration looking for blood return was conducted prior to all injections. At no point did we inject any substances, as a needle was being advanced. No attempts were made at seeking any paresthesias. Safe injection practices and needle disposal techniques used. Medications properly checked for expiration dates. SDV (single dose vial) medications used. Description of the Procedure: Protocol guidelines  were followed. The patient was placed in position over the fluoroscopy table. The target area was identified and the area prepped in the usual manner. Skin & deeper tissues infiltrated with local anesthetic. Appropriate amount of time allowed to pass for local anesthetics  to take effect. Radiofrequency needles were introduced to the target area using fluoroscopy. Using the a Radiofrequency Generator, sensory stimulation using 50 Hz was used to locate & identify the nerve, making sure that the needle was positioned such that there was no sensory stimulation below 0.3 V or above 0.7 V. Stimulation using 2 Hz was used to evaluate the motor component. Care was taken not to lesion any nerves that demonstrated motor stimulation at an output of less than 2.5 times that of the sensory threshold, or a maximum of 2.0 V. Once satisfactory placement of the needles was achieved, the anesthetic was slowly injected, using intermittent aspiration to confirm negative flow of heme. After waiting for 2 minutes, the ablation was performed at 80 degrees C for 60 seconds.The needles were then removed and the area cleansed, making sure to leave some of the prepping solution back to take advantage of its long term bactericidal properties. EBL: None Materials & Medications Used:  Needle(s) Used: 22g - 10cm, Teflon-coated, Radiofrequency needle(s) Solution Injected: Ropivacaine 0.2% (10 ml) Medications Administered today: We administered midazolam, ropivacaine (PF) 2 mg/ml (0.2%), diphenhydrAMINE, ropivacaine (PF) 2 mg/ml (0.2%), midazolam, diphenhydrAMINE, promethazine, ceFAZolin, promethazine, sodium chloride, and ceFAZolin.Please see chart orders for dosing details.  Imaging Guidance:  Type of Imaging Technique: Fluoroscopy Guidance (Non-spinal) Indication(s): Assistance in needle guidance and placement for procedures requiring needle placement in or near specific anatomical locations not easily accessible without such  assistance. Exposure Time: Please see nurses notes. Contrast: None required. Fluoroscopic Guidance: I was personally present in the fluoroscopy suite, where the patient was placed in position for the procedure, over the fluoroscopy-compatible table. Fluoroscopy was manipulated, using "Tunnel Vision Technique", to obtain the best possible view of the target area, on the affected side. Parallax error was corrected before commencing the procedure. A "direction-depth-direction" technique was used to introduce the needle under continuous pulsed fluoroscopic guidance. Once the target was reached, antero-posterior, oblique, and lateral fluoroscopic projection views were taken to confirm needle placement in all planes. Permanently recorded images stored by scanning into EMR. Interpretation: No contrast injected. Intraoperative imaging interpretation by performing Physician. Adequate needle placement confirmed. Needle placement confirmed in AP, lateral, & Oblique Views. Permanent hardcopy images in multiple planes scanned into the patient's record.  Antibiotic Prophylaxis:  Type:  Antibiotics Given (last 72 hours)    Date/Time Action Medication Dose   03/11/15 0905 Given   ceFAZolin (ANCEF) injection 1 g 1 g      Indication(s): Orthopedic Prosthesis of less than 6 months since implant.  Post-operative Assessment:  Pain Score: 3  Complications: No immediate post-treatment complications were observed. Disposition: The patient was discharged home, once institutional criteria were met. Return to clinic in 2 weeks for follow-up evaluation and interpretation of results. The patient tolerated the entire procedure well. A repeat set of vitals were taken after the procedure and the patient was kept under observation following institutional policy, for this type of procedure. Post-procedural neurological assessment was performed, showing return to baseline, prior to discharge. The patient was provided with  post-procedure discharge instructions, including a section on how to identify potential problems. Should any problems arise concerning this procedure, the patient was given instructions to immediately contact us, at any time, without hesitation. In any case, we plan to contact the patient by telephone for a follow-up status report regarding this interventional procedure. Comments:  No additional relevant information.  Primary Care Physician: Thressa Sheller, MD Location: [facility] Note by: Kathlen Brunswick. Dossie Arbour, M.D,  DABA, DABAPM, DABPM, DABIPP, FIPP  Disclaimer:  Medicine is not an Chief Strategy Officer. The only guarantee in medicine is that nothing is guaranteed. It is important to note that the decision to proceed with this intervention was based on the information collected from the patient. The Data and conclusions were drawn from the patient's questionnaire, the interview, and the physical examination. Because the information was provided in large part by the patient, it cannot be guaranteed that it has not been purposely or unconsciously manipulated. Every effort has been made to obtain as much relevant data as possible for this evaluation. It is important to note that the conclusions that lead to this procedure are derived in large part from the available data. Always take into account that the treatment will also be dependent on availability of resources and existing treatment guidelines, considered by other Pain Management Practitioners as being common knowledge and practice, at the time of the intervention. For Medico-Legal purposes, it is also important to point out that variation in procedural techniques and pharmacological choices are the acceptable norm. The indications, contraindications, technique, and results of the above procedure should only be interpreted and judged by a Board-Certified Interventional Pain Specialist with extensive familiarity and expertise in the  same exact procedure and technique. Attempts at providing opinions without similar or greater experience and expertise than that of the treating physician will be considered as inappropriate and unethical, and shall result in a formal complaint to the state medical board and applicable specialty societies.

## 2015-03-13 DIAGNOSIS — R319 Hematuria, unspecified: Secondary | ICD-10-CM | POA: Diagnosis not present

## 2015-03-14 DIAGNOSIS — R310 Gross hematuria: Secondary | ICD-10-CM | POA: Diagnosis not present

## 2015-03-14 DIAGNOSIS — R3989 Other symptoms and signs involving the genitourinary system: Secondary | ICD-10-CM | POA: Diagnosis not present

## 2015-03-14 DIAGNOSIS — N3010 Interstitial cystitis (chronic) without hematuria: Secondary | ICD-10-CM | POA: Diagnosis not present

## 2015-03-17 ENCOUNTER — Other Ambulatory Visit: Payer: Self-pay | Admitting: Neurology

## 2015-03-17 DIAGNOSIS — M1811 Unilateral primary osteoarthritis of first carpometacarpal joint, right hand: Secondary | ICD-10-CM | POA: Diagnosis not present

## 2015-03-17 DIAGNOSIS — N3010 Interstitial cystitis (chronic) without hematuria: Secondary | ICD-10-CM | POA: Diagnosis not present

## 2015-03-17 DIAGNOSIS — R3989 Other symptoms and signs involving the genitourinary system: Secondary | ICD-10-CM | POA: Diagnosis not present

## 2015-03-17 DIAGNOSIS — M67431 Ganglion, right wrist: Secondary | ICD-10-CM | POA: Diagnosis not present

## 2015-03-17 NOTE — Telephone Encounter (Signed)
Denied refill for Fluticasone to [facility]. Patient needs OV. Last OV 10/22/13.

## 2015-03-18 ENCOUNTER — Ambulatory Visit (INDEPENDENT_AMBULATORY_CARE_PROVIDER_SITE_OTHER): Payer: BLUE CROSS/BLUE SHIELD | Admitting: *Deleted

## 2015-03-18 ENCOUNTER — Ambulatory Visit (INDEPENDENT_AMBULATORY_CARE_PROVIDER_SITE_OTHER): Payer: BLUE CROSS/BLUE SHIELD | Admitting: Neurology

## 2015-03-18 DIAGNOSIS — G43709 Chronic migraine without aura, not intractable, without status migrainosus: Secondary | ICD-10-CM | POA: Diagnosis not present

## 2015-03-18 DIAGNOSIS — J309 Allergic rhinitis, unspecified: Secondary | ICD-10-CM | POA: Diagnosis not present

## 2015-03-18 DIAGNOSIS — IMO0002 Reserved for concepts with insufficient information to code with codable children: Secondary | ICD-10-CM

## 2015-03-18 MED ORDER — ONABOTULINUMTOXINA 100 UNITS IJ SOLR
200.0000 [IU] | Freq: Once | INTRAMUSCULAR | Status: AC
  Administered 2015-03-18: 200 [IU] via INTRAMUSCULAR

## 2015-03-18 NOTE — Procedures (Signed)
BOTOX PROCEDURE NOTE  Risks and benefits were discussed with the patient, who appears to understand the discussion and verbal consent was obtained.  200 units were reconstituted with 4 mL 0.9% sodium chloride.  Skin was cleaned with alcohol prior to injection.  A 30-gauge, 0.5-inch needle was used.  Total:  4 mL (200 units)  # of injection sites Muscle   Dose Total 1 each side  Corrugator   0.2 mL (10 units) 1    Procerus   0.2 mL (10 units) 2 each side  Frontalis  0.4 mL (20 units) 4 each side  Temporalis  0.8 mL (40 units) 3 each side  Occipitalis  0.6 mL (30 units) 2 each side  Cervical paraspinals 0.4 mL (20 units) 3 each side  Trapezius  0.6 mL (30 units)    Total 31  Total used  160 units    Total wasted  40 units  The patient tolerated the procedure.   Adam R. Tomi Likens, DO

## 2015-03-18 NOTE — Progress Notes (Signed)
See procedure note.

## 2015-03-21 ENCOUNTER — Other Ambulatory Visit: Payer: Self-pay | Admitting: Urology

## 2015-03-24 ENCOUNTER — Encounter (HOSPITAL_BASED_OUTPATIENT_CLINIC_OR_DEPARTMENT_OTHER): Payer: Self-pay | Admitting: *Deleted

## 2015-03-24 NOTE — Progress Notes (Signed)
NPO AFTER MN.  ARRIVE AT 0800.  NEEDS HG. WILL  TAKE NUCYNTA, XANAX, AND DO ADVAIR INHALER AM DOS W/ SIPS OF WATER.  PT IS VERY ADDIMENT THAT SHE GET IV BENADRYL AND PHENERGAN PRE-OP TO PREVENT ANY REACTION.  PER CHART PT DIFFICULT IV STICK BUT PER LAST VISIT'S AT [facility] , ABLE TO GET IV WITH ONE ATTEMPT.

## 2015-03-25 ENCOUNTER — Ambulatory Visit (INDEPENDENT_AMBULATORY_CARE_PROVIDER_SITE_OTHER): Payer: BLUE CROSS/BLUE SHIELD | Admitting: Allergy and Immunology

## 2015-03-25 ENCOUNTER — Encounter: Payer: Self-pay | Admitting: Allergy and Immunology

## 2015-03-25 VITALS — BP 125/80 | HR 88 | Temp 98.3°F | Resp 16

## 2015-03-25 DIAGNOSIS — J45901 Unspecified asthma with (acute) exacerbation: Secondary | ICD-10-CM | POA: Diagnosis not present

## 2015-03-25 DIAGNOSIS — J45909 Unspecified asthma, uncomplicated: Secondary | ICD-10-CM | POA: Insufficient documentation

## 2015-03-25 DIAGNOSIS — J3089 Other allergic rhinitis: Secondary | ICD-10-CM

## 2015-03-25 MED ORDER — METHYLPREDNISOLONE ACETATE 80 MG/ML IJ SUSP
80.0000 mg | Freq: Once | INTRAMUSCULAR | Status: AC
  Administered 2015-03-25: 80 mg via INTRAMUSCULAR

## 2015-03-25 MED ORDER — IPRATROPIUM-ALBUTEROL 0.5-2.5 (3) MG/3ML IN SOLN
3.0000 mL | Freq: Once | RESPIRATORY_TRACT | Status: AC
  Administered 2015-03-25: 3 mL via RESPIRATORY_TRACT

## 2015-03-25 MED ORDER — ALBUTEROL SULFATE (2.5 MG/3ML) 0.083% IN NEBU: 2.5000 mg | INHALATION_SOLUTION | RESPIRATORY_TRACT | Status: DC | PRN

## 2015-03-25 MED ORDER — MOMETASONE FURO-FORMOTEROL FUM 200-5 MCG/ACT IN AERO: INHALATION_SPRAY | RESPIRATORY_TRACT | Status: DC

## 2015-03-25 NOTE — Patient Instructions (Addendum)
Asthma with acute exacerbation  Depo-Medrol 80 mg was administered in the office.  A sample and prescription have been provided for Dulera 200/5 g, 2 inhalations twice a day.  To maximize pulmonary deposition, a spacer has been provided along with instructions for its proper administration with an HFA inhaler.   A prescription has been provided for tessilon pearls 200 mg every 6 hours as needed.  A portable nebulizer has been provided.   The patient has been asked to contact me if her symptoms persist, progress, or if she becomes febrile. Otherwise, she may return for follow up in 4 months.  Allergic rhinitis  Continue allergen avoidance measures and fluticasone nasal spray as needed.  She may resume aeroallergen immunotherapy when the asthma exacerbation has resolved.    Return in about 4 months (around 07/23/2015), or if symptoms worsen or fail to improve.

## 2015-03-25 NOTE — Assessment & Plan Note (Signed)
   Continue allergen avoidance measures and fluticasone nasal spray as needed.  She may resume aeroallergen immunotherapy when the asthma exacerbation has resolved.

## 2015-03-25 NOTE — Assessment & Plan Note (Signed)
   Depo-Medrol 80 mg was administered in the office.  A sample and prescription have been provided for Dulera 200/5 g, 2 inhalations twice a day.  To maximize pulmonary deposition, a spacer has been provided along with instructions for its proper administration with an HFA inhaler.   A prescription has been provided for tessilon pearls 200 mg every 6 hours as needed.  A portable nebulizer has been provided.   The patient has been asked to contact me if her symptoms persist, progress, or if she becomes febrile. Otherwise, she may return for follow up in 4 months.

## 2015-03-25 NOTE — Progress Notes (Signed)
Follow-up Note  RE: Kari Green MRN: [id number] DOB: 07/11/1965 Date of Office Visit: 03/25/2015  Primary care provider: Thressa Sheller, MD Referring provider: Thressa Sheller, MD  History of present illness: HPI Comments: Kari Green is a 50 y.o. female with persistent asthma and allergic rhinitis who presents today for sick visit.  She reports that she was diagnosed with pneumonia approximately a month ago treated with antibiotics (unspecified).  The fevers resolved with the antibiotics, however she reports that over the past 2 weeks she has experienced a "horrible" dry barking cough, dyspnea, and wheezing.  The symptoms are more severe at nighttime.  The lower respiratory symptoms have progressed over the past 2 weeks.  She denies fevers or chills.  She reports that her asthma had been well-controlled with Symbicort, however due to insurance issues she was switched to Advair HFA and has had increased symptoms since that time.  She reports that she has allergic reactions with prednisone but has consistently been able to tolerate Depo-Medrol.   Assessment and plan: Asthma with acute exacerbation  Depo-Medrol 80 mg was administered in the office.  A sample and prescription have been provided for Dulera 200/5 g, 2 inhalations twice a day.  To maximize pulmonary deposition, a spacer has been provided along with instructions for its proper administration with an HFA inhaler.   A prescription has been provided for tessilon pearls 200 mg every 6 hours as needed.  A portable nebulizer has been provided.   The patient has been asked to contact me if her symptoms persist, progress, or if she becomes febrile. Otherwise, she may return for follow up in 4 months.  Allergic rhinitis  Continue allergen avoidance measures and fluticasone nasal spray as needed.  She may resume aeroallergen immunotherapy when the asthma exacerbation has resolved.    Meds ordered this encounter    Medications  . ipratropium-albuterol (DUONEB) 0.5-2.5 (3) MG/3ML nebulizer solution 3 mL    Sig:   . methylPREDNISolone acetate (DEPO-MEDROL) injection 80 mg    Sig:   . albuterol (PROVENTIL) (2.5 MG/3ML) 0.083% nebulizer solution    Sig: Take 3 mLs (2.5 mg total) by nebulization every 4 (four) hours as needed for wheezing (or coughing spells).    Dispense:  25 vial    Refill:  1    KEEP ON FILE - PT WILL CALL WHEN READY  . mometasone-formoterol (DULERA) 200-5 MCG/ACT AERO    Sig: INHALE TWO PUFFS TWICE DAILY TO PREVENT COUGH OR WHEEZE. RINSE, GARGLE, AND SPIT AFTER USE. USE WITH SPACER.    Dispense:  13 g    Refill:  5    Diagnositics: Spirometry reveals an FVC of 1.98 L (67% predicted) and an FEV1 of 1.21 L (49% predicted) with significant (280 mL, 23%) postbronchodilator improvement.    Physical examination: Blood pressure 125/80, pulse 88, temperature 98.3 F (36.8 C), temperature source Oral, resp. rate 16.  General: Alert, interactive, in no acute distress. HEENT: TMs pearly gray, turbinates moderately edematous without discharge, post-pharynx moderately erythematous. Neck: Supple without lymphadenopathy. Lungs: Mildly decreased breath sounds with expiratory wheezing bilaterally. CV: Normal S1, S2 without murmurs. Skin: Warm and dry, without lesions or rashes.  The following portions of the patient's history were reviewed and updated as appropriate: allergies, current medications, past family history, past medical history, past social history, past surgical history and problem list.    Medication List       This list is accurate as of: 03/25/15  6:01 PM.  Always  use your most recent med list.               albuterol 108 (90 Base) MCG/ACT inhaler  Commonly known as:  PROAIR HFA  Inhale 2 puffs into the lungs every 6 (six) hours as needed for wheezing or shortness of breath.     albuterol (2.5 MG/3ML) 0.083% nebulizer solution  Commonly known as:  PROVENTIL  Take  3 mLs (2.5 mg total) by nebulization every 4 (four) hours as needed for wheezing (or coughing spells).     ALPRAZolam 0.5 MG tablet  Commonly known as:  XANAX  Take 0.5 mg by mouth 3 (three) times daily as needed. Anxiety     cyanocobalamin 1000 MCG/ML injection  Commonly known as:  (VITAMIN B-12)  Inject 1,000 mcg into the muscle once a week. Reported on 03/25/2015     diphenhydrAMINE 50 MG tablet  Commonly known as:  BENADRYL  Take 50 mg by mouth every 6 (six) hours as needed (prn , alternates with hydroxyzine).     EPINEPHrine 0.3 mg/0.3 mL Soaj injection  Commonly known as:  EPIPEN 2-PAK  Inject 0.3 mLs (0.3 mg total) into the muscle as needed (in the event of a severe life-threatening reaction).     estradiol 0.5 MG tablet  Commonly known as:  ESTRACE  Take 0.5 mg by mouth at bedtime.     EVZIO 0.4 MG/0.4ML Soaj  Generic drug:  Naloxone HCl  AUTO INJECT AS NEEDED     fluticasone 50 MCG/ACT nasal spray  Commonly known as:  FLONASE  Place 2 sprays into both nostrils daily.     fluticasone-salmeterol 230-21 MCG/ACT inhaler  Commonly known as:  ADVAIR HFA  Inhale 1 puff into the lungs 2 (two) times daily.     hydrOXYzine 25 MG tablet  Commonly known as:  ATARAX/VISTARIL  Take 50 mg by mouth daily as needed for itching (Take 25 mg by mouth See admin instructions. Takes every Tues 30 mins prior to allergy shot & after as needed for itching. Alternates with Benadryl).     ketoconazole 2 % shampoo  Commonly known as:  NIZORAL  APPLICATIONS APPLY ON THE SKIN [place] HAIR 2-3 TIMES WEEKLY     levothyroxine 50 MCG tablet  Commonly known as:  SYNTHROID, LEVOTHROID  Take by mouth.     mometasone-formoterol 200-5 MCG/ACT Aero  Commonly known as:  DULERA  INHALE TWO PUFFS TWICE DAILY TO PREVENT COUGH OR WHEEZE. RINSE, GARGLE, AND SPIT AFTER USE. USE WITH SPACER.     NUCYNTA 100 MG Tabs  Generic drug:  Tapentadol HCl  Take 1 tablet by mouth every 8 (eight) hours.      oxyCODONE-acetaminophen 5-325 MG tablet  Commonly known as:  ROXICET  Take 1-2 tablets by mouth every 6 (six) hours as needed for severe pain.     promethazine 25 MG tablet  Commonly known as:  PHENERGAN  Take 25 mg by mouth every 4 (four) hours as needed.     RECLAST IV  Inject into the vein as directed. Yearly --  Last dose Oct 2015     topiramate 50 MG tablet  Commonly known as:  TOPAMAX  Take 50 mg by mouth 2 (two) times daily.     UNABLE TO FIND  once a week. Allergy injections - once  a week        Allergies  Allergen Reactions  . Iodinated Diagnostic Agents Hives and Itching  . Red Dye Hives and Itching  .  Chloraprep One Step [Chlorhexidine Gluconate] Other (See Comments)    CHEMICAL BURN, BLISTERS  . Cobalt Hives and Swelling    DESTRUCTION OF BONE  . Corticosteroids Swelling  . Eggs Or Egg-Derived Products Hives and Itching  . Latex Hives and Swelling    TIGHTNESS IN THROAT  . Morphine And Related Hives and Itching    ALL PAIN MEDS - MUST HAVE BENADRYL PRIOR TO PAIN MED.  . Nickel Hives and Swelling    DESTRUCTION OF BONE  . Other Hives and Other (See Comments)    Other reaction(s): Other Burns skin ALL METALS - HIVES, SWELLING LOTIONS - HIVES (DUE TO THE DYE) FRAGRANCES - CAUSES MIGRAINES  . Peanuts [Peanut Oil] Hives and Swelling    TIGHTNESS IN THROAT  . Supartz [Sodium Hyaluronate] Hives and Swelling  . Surgical Lubricant Swelling  . Tape Other (See Comments)    BURNS SKIN  . Yellow Dyes (Non-Tartrazine) Hives       . Azithromycin Swelling  . Flexeril [Cyclobenzaprine] Swelling  . Soap Itching    DIAL, DOVE   Review of systems: Constitutional: Negative for fever, chills and weight loss.  HENT: Negative for nosebleeds.   Eyes: Negative for blurred vision.  Respiratory: Negative for hemoptysis.  Positive for coughing, dyspnea, wheezing. Cardiovascular: Negative for chest pain.  Gastrointestinal: Negative for diarrhea and constipation.    Genitourinary: Negative for dysuria.  Musculoskeletal: Negative for myalgias and joint pain.  Neurological: Negative for dizziness.  Endo/Heme/Allergies: Does not bruise/bleed easily.   Past Medical History  Diagnosis Date  . Anxiety   . IC (interstitial cystitis)   . Osteoporosis   . Irritable bowel syndrome (IBS)   . History of hiatal hernia   . Migraines   . Hypothyroidism (acquired)   . History of kidney stones 04/2014  . Pain syndrome, chronic   . Arthritis     right thumb  . Degenerative disc disease, cervical   . Degenerative disc disease, lumbar   . Chronic, continuous use of opioids   . PONV (postoperative nausea and vomiting)     uncontrollable itching with anesthesia; hx. of anesthesia awareness  . Seasonal allergies   . Sciatica     bilateral  . Fibromyalgia   . Dental crowns present   . Difficulty swallowing pills   . Difficult intravenous access   . Delayed gastric emptying   . Esophageal dysmotility   . Hemangioma, renal   . Hemangioma of liver   . Limited joint range of motion     right knee - unable to fully extend right leg  . Knee pain, right     states gets "nerves burned" periodically    Family History  Problem Relation Age of Onset  . Anesthesia problems Mother   . Cancer Mother     ovarian  . Cancer Daughter     cervical  . Cancer Maternal Aunt     breast  . Leukemia Mother     Social History   Social History  . Marital Status: Married    Spouse Name: N/A  . Number of Children: N/A  . Years of Education: N/A   Occupational History  . Not on file.   Social History Main Topics  . Smoking status: Former Smoker    Quit date: 02/22/2006  . Smokeless tobacco: Never Used  . Alcohol Use: 0.0 oz/week    0 Standard drinks or equivalent per week     Comment: rarely  . Drug Use: No  .  Sexual Activity: Not on file   Other Topics Concern  . Not on file   Social History Narrative   ** Merged History Encounter **        I  appreciate the opportunity to take part in this [place]'s care. Please do not hesitate to contact me with questions.  Sincerely,   R. Edgar Frisk, MD

## 2015-03-26 ENCOUNTER — Encounter: Payer: Self-pay | Admitting: Neurology

## 2015-03-27 ENCOUNTER — Encounter (HOSPITAL_BASED_OUTPATIENT_CLINIC_OR_DEPARTMENT_OTHER): Payer: Self-pay | Admitting: *Deleted

## 2015-03-27 ENCOUNTER — Encounter (HOSPITAL_BASED_OUTPATIENT_CLINIC_OR_DEPARTMENT_OTHER)
Admission: RE | Disposition: A | Discharge status: Home / Self Care | Payer: Self-pay | Source: Ambulatory Visit | Attending: Urology

## 2015-03-27 ENCOUNTER — Ambulatory Visit (HOSPITAL_BASED_OUTPATIENT_CLINIC_OR_DEPARTMENT_OTHER): Payer: BLUE CROSS/BLUE SHIELD | Admitting: Anesthesiology

## 2015-03-27 ENCOUNTER — Ambulatory Visit (HOSPITAL_BASED_OUTPATIENT_CLINIC_OR_DEPARTMENT_OTHER)
Admission: RE | Admit: 2015-03-27 | Discharge: 2015-03-27 | Disposition: A | Discharge status: Home / Self Care | Payer: BLUE CROSS/BLUE SHIELD | Source: Ambulatory Visit | Attending: Urology | Admitting: Urology

## 2015-03-27 DIAGNOSIS — Z96659 Presence of unspecified artificial knee joint: Secondary | ICD-10-CM | POA: Insufficient documentation

## 2015-03-27 DIAGNOSIS — Z87891 Personal history of nicotine dependence: Secondary | ICD-10-CM | POA: Insufficient documentation

## 2015-03-27 DIAGNOSIS — N189 Chronic kidney disease, unspecified: Secondary | ICD-10-CM | POA: Diagnosis not present

## 2015-03-27 DIAGNOSIS — Z79899 Other long term (current) drug therapy: Secondary | ICD-10-CM | POA: Diagnosis not present

## 2015-03-27 DIAGNOSIS — K449 Diaphragmatic hernia without obstruction or gangrene: Secondary | ICD-10-CM | POA: Insufficient documentation

## 2015-03-27 DIAGNOSIS — Z9049 Acquired absence of other specified parts of digestive tract: Secondary | ICD-10-CM | POA: Diagnosis not present

## 2015-03-27 DIAGNOSIS — N3010 Interstitial cystitis (chronic) without hematuria: Secondary | ICD-10-CM | POA: Diagnosis not present

## 2015-03-27 DIAGNOSIS — E7800 Pure hypercholesterolemia, unspecified: Secondary | ICD-10-CM | POA: Diagnosis not present

## 2015-03-27 DIAGNOSIS — Z90710 Acquired absence of both cervix and uterus: Secondary | ICD-10-CM | POA: Insufficient documentation

## 2015-03-27 DIAGNOSIS — M1990 Unspecified osteoarthritis, unspecified site: Secondary | ICD-10-CM | POA: Insufficient documentation

## 2015-03-27 DIAGNOSIS — F419 Anxiety disorder, unspecified: Secondary | ICD-10-CM | POA: Insufficient documentation

## 2015-03-27 DIAGNOSIS — M797 Fibromyalgia: Secondary | ICD-10-CM | POA: Insufficient documentation

## 2015-03-27 DIAGNOSIS — R102 Pelvic and perineal pain: Secondary | ICD-10-CM

## 2015-03-27 DIAGNOSIS — G4730 Sleep apnea, unspecified: Secondary | ICD-10-CM | POA: Insufficient documentation

## 2015-03-27 DIAGNOSIS — J449 Chronic obstructive pulmonary disease, unspecified: Secondary | ICD-10-CM | POA: Insufficient documentation

## 2015-03-27 DIAGNOSIS — E039 Hypothyroidism, unspecified: Secondary | ICD-10-CM | POA: Insufficient documentation

## 2015-03-27 HISTORY — PX: CYSTO WITH HYDRODISTENSION: SHX5453

## 2015-03-27 LAB — POCT HEMOGLOBIN-HEMACUE: Hemoglobin: 14.5 g/dL (ref 12.0–15.0)

## 2015-03-27 SURGERY — CYSTOSCOPY, WITH BLADDER HYDRODISTENSION
Anesthesia: General | Site: Bladder

## 2015-03-27 MED ORDER — STERILE WATER FOR IRRIGATION IR SOLN
Status: DC | PRN
  Administered 2015-03-27: 3000 mL

## 2015-03-27 MED ORDER — KETOROLAC TROMETHAMINE 30 MG/ML IJ SOLN
INTRAMUSCULAR | Status: AC
  Filled 2015-03-27: qty 1

## 2015-03-27 MED ORDER — OXYCODONE HCL 5 MG/5ML PO SOLN
5.0000 mg | Freq: Once | ORAL | Status: DC | PRN
  Filled 2015-03-27: qty 5

## 2015-03-27 MED ORDER — FENTANYL CITRATE (PF) 100 MCG/2ML IJ SOLN
INTRAMUSCULAR | Status: AC
  Filled 2015-03-27: qty 2

## 2015-03-27 MED ORDER — DEXAMETHASONE SODIUM PHOSPHATE 4 MG/ML IJ SOLN
INTRAMUSCULAR | Status: DC | PRN
  Administered 2015-03-27: 10 mg via INTRAVENOUS

## 2015-03-27 MED ORDER — ROCURONIUM BROMIDE 100 MG/10ML IV SOLN
INTRAVENOUS | Status: AC
  Filled 2015-03-27: qty 1

## 2015-03-27 MED ORDER — DIPHENHYDRAMINE HCL 50 MG/ML IJ SOLN
INTRAMUSCULAR | Status: AC
  Filled 2015-03-27: qty 1

## 2015-03-27 MED ORDER — DIPHENHYDRAMINE HCL 50 MG/ML IJ SOLN
50.0000 mg | Freq: Once | INTRAMUSCULAR | Status: AC
  Administered 2015-03-27: 50 mg via INTRAVENOUS
  Filled 2015-03-27: qty 1

## 2015-03-27 MED ORDER — MIDAZOLAM HCL 2 MG/2ML IJ SOLN
INTRAMUSCULAR | Status: AC
  Filled 2015-03-27: qty 2

## 2015-03-27 MED ORDER — KETOROLAC TROMETHAMINE 30 MG/ML IJ SOLN
INTRAMUSCULAR | Status: DC | PRN
  Administered 2015-03-27: 30 mg via INTRAVENOUS

## 2015-03-27 MED ORDER — OXYCODONE HCL 5 MG PO TABS
5.0000 mg | ORAL_TABLET | Freq: Once | ORAL | Status: DC | PRN
  Filled 2015-03-27: qty 1

## 2015-03-27 MED ORDER — BELLADONNA ALKALOIDS-OPIUM 16.2-60 MG RE SUPP
RECTAL | Status: AC
  Filled 2015-03-27: qty 1

## 2015-03-27 MED ORDER — PHENAZOPYRIDINE HCL 200 MG PO TABS
ORAL | Status: DC | PRN
  Administered 2015-03-27: 15 mL via INTRAVESICAL

## 2015-03-27 MED ORDER — ONDANSETRON HCL 4 MG/2ML IJ SOLN
INTRAMUSCULAR | Status: AC
  Filled 2015-03-27: qty 2

## 2015-03-27 MED ORDER — CIPROFLOXACIN IN D5W 400 MG/200ML IV SOLN
400.0000 mg | INTRAVENOUS | Status: AC
  Administered 2015-03-27: 400 mg via INTRAVENOUS
  Filled 2015-03-27: qty 200

## 2015-03-27 MED ORDER — MIRABEGRON ER 50 MG PO TB24: 50.0000 mg | ORAL_TABLET | Freq: Every day | ORAL | Status: DC

## 2015-03-27 MED ORDER — LACTATED RINGERS IV SOLN
INTRAVENOUS | Status: DC
  Administered 2015-03-27: 10:00:00 via INTRAVENOUS
  Filled 2015-03-27: qty 1000

## 2015-03-27 MED ORDER — DEXAMETHASONE SODIUM PHOSPHATE 10 MG/ML IJ SOLN
INTRAMUSCULAR | Status: AC
  Filled 2015-03-27: qty 1

## 2015-03-27 MED ORDER — PROPOFOL 10 MG/ML IV BOLUS
INTRAVENOUS | Status: AC
  Filled 2015-03-27: qty 40

## 2015-03-27 MED ORDER — BELLADONNA ALKALOIDS-OPIUM 16.2-60 MG RE SUPP
1.0000 | Freq: Every day | RECTAL | Status: DC
  Administered 2015-03-27: 1 via RECTAL
  Filled 2015-03-27: qty 1

## 2015-03-27 MED ORDER — LIDOCAINE HCL (CARDIAC) 20 MG/ML IV SOLN
INTRAVENOUS | Status: DC | PRN
  Administered 2015-03-27: 50 mg via INTRAVENOUS

## 2015-03-27 MED ORDER — FENTANYL CITRATE (PF) 100 MCG/2ML IJ SOLN
25.0000 ug | INTRAMUSCULAR | Status: DC | PRN
  Filled 2015-03-27: qty 1

## 2015-03-27 MED ORDER — CIPROFLOXACIN IN D5W 400 MG/200ML IV SOLN
INTRAVENOUS | Status: AC
  Filled 2015-03-27: qty 200

## 2015-03-27 MED ORDER — ONDANSETRON HCL 4 MG/2ML IJ SOLN
4.0000 mg | Freq: Four times a day (QID) | INTRAMUSCULAR | Status: DC | PRN
  Filled 2015-03-27: qty 2

## 2015-03-27 MED ORDER — LIDOCAINE HCL (CARDIAC) 20 MG/ML IV SOLN
INTRAVENOUS | Status: AC
  Filled 2015-03-27: qty 5

## 2015-03-27 MED ORDER — ONDANSETRON HCL 4 MG/2ML IJ SOLN
INTRAMUSCULAR | Status: DC | PRN
  Administered 2015-03-27: 4 mg via INTRAVENOUS

## 2015-03-27 MED ORDER — PROPOFOL 10 MG/ML IV BOLUS
INTRAVENOUS | Status: DC | PRN
  Administered 2015-03-27: 40 mg via INTRAVENOUS
  Administered 2015-03-27: 120 mg via INTRAVENOUS

## 2015-03-27 MED ORDER — FENTANYL CITRATE (PF) 100 MCG/2ML IJ SOLN
INTRAMUSCULAR | Status: DC | PRN
Start: 2015-03-27 — End: 2015-03-27
  Administered 2015-03-27 (×2): 50 ug via INTRAVENOUS

## 2015-03-27 MED ORDER — MIDAZOLAM HCL 5 MG/5ML IJ SOLN
INTRAMUSCULAR | Status: DC | PRN
  Administered 2015-03-27: 2 mg via INTRAVENOUS

## 2015-03-27 SURGICAL SUPPLY — 15 items
BAG DRAIN URO-CYSTO SKYTR STRL (DRAIN) ×2 IMPLANT
CLOTH BEACON ORANGE TIMEOUT ST (SAFETY) ×2 IMPLANT
GLOVE BIOGEL PI IND STRL 6.5 (GLOVE) ×2 IMPLANT
GLOVE BIOGEL PI IND STRL 7.5 (GLOVE) ×1 IMPLANT
GLOVE BIOGEL PI INDICATOR 6.5 (GLOVE) ×2
GLOVE BIOGEL PI INDICATOR 7.5 (GLOVE) ×1
GOWN STRL REUS W/TWL XL LVL3 (GOWN DISPOSABLE) ×2 IMPLANT
KIT ROOM TURNOVER WOR (KITS) ×2 IMPLANT
MANIFOLD NEPTUNE II (INSTRUMENTS) ×2 IMPLANT
NDL SAFETY ECLIPSE 18X1.5 (NEEDLE) ×1 IMPLANT
NEEDLE HYPO 18GX1.5 SHARP (NEEDLE) ×1
PACK CYSTO (CUSTOM PROCEDURE TRAY) ×4 IMPLANT
SYR 20CC LL (SYRINGE) ×2 IMPLANT
TUBE CONNECTING 12X1/4 (SUCTIONS) ×2 IMPLANT
WATER STERILE IRR 3000ML UROMA (IV SOLUTION) ×2 IMPLANT

## 2015-03-27 NOTE — H&P (Signed)
History of Present Illness Interstitial cystitis: She has a history of IC by clinical criteria. She did undergo cystoscopy with bladder biopsy in 2/05 which revealed no abnormality on pathology however after hydrodistention to 800 cc. She did have petechial hemorrhages noted. She noted marked and significant improvement of her urinary symptoms after undergoing her hydrodistention however.    05/08/14 the patient was taken to the operating room for retrograde pyelograms, which were negative, and hydrodistention. The lesions bladder capacity was noted to be approximately 600 mL's. The bladder had fibrotic changes but there were no ring or ulcers or petechial hemorrhage.  03/14/15 - the patient was seen for evaluation of hematuria. Repeat CT, hematuria protocol, was performed which was negative for any obvious etiology of her hematuria.    Right renal cyst. She had possible hematuria and in 1/10 underwent a CT scan which revealed no abnormality of the upper tract. A noncontrasted CT scan in 6/10 revealed bilateral, lower pole, nonobstructing 1 mm stones.     Interval history: The patient presented last week for several days of worsening bladder pain and associated gross hematuria. Her workup included a CT scan which was negative. Her UA was also negative. She presents today for ongoing discussion and management of her symptoms. Patient complains mostly of urinary frequency and urgency. She feels that her bladder is empty after she voids. She voids small amounts. She is not able to sleep. She recently underwent ganglion cyst removal, her right hand is in a spica cast.   Past Medical History Problems  1. History of Anxiety (F41.9) 2. History of arthritis (Z87.39) 3. History of asthma (Z87.09) 4. History of fibromyalgia (Z87.39) 5. History of hiatal hernia (Z87.19) 6. History of hypercholesterolemia (Z86.39) 7. History of hypothyroidism (Z86.39) 8. History of migraine headaches (Z86.69) 9. History  of sleep apnea (Z86.69)  Surgical History Problems  1. History of Bladder Irrigation 2. History of Cholecystectomy 3. History of Cystoscopy With Biopsy 4. History of Cystoscopy With Dilation Of Bladder 5. History of Cystoscopy With Dilation Of Bladder 6. History of Knee Replacement 7. History of Thyroid Surgery 8. History of Tonsillectomy 9. History of Total Abdominal Hysterectomy  Current Meds 1. ALPRAZolam 0.5 MG Oral Tablet;  Therapy: 23Jul2012 to Recorded 2. Estradiol 0.5 MG Oral Tablet;  Therapy: [phone] to Recorded 3. HydrOXYzine HCl - 25 MG Oral Tablet;  Therapy: 06Aug2012 to Recorded 4. Nucynta 100 MG Oral Tablet;  Therapy: (Recorded:23Feb2016) to Recorded 5. Pantoprazole Sodium 40 MG Oral Tablet Delayed Release;  Therapy: 26Jun2012 to Recorded 6. Synthroid 100 MCG Oral Tablet;  Therapy: (Recorded:23Feb2016) to Recorded 7. Topamax 100 MG Oral Tablet;  Therapy: (Recorded:23Feb2016) to Recorded 8. TraMADol HCl - 50 MG Oral Tablet; TAKE 1 TABLET Every 6 hours PRN;  Therapy: 20Jan2017 to (Last Rx:20Jan2017) Ordered 9. Vitamin B12 TABS;  Therapy: (Recorded:23Feb2016) to Recorded  Allergies Medication  1. Codeine Derivatives 2. Crestor TABS 3. Darvocet-N 100 TABS 4. Red Dye 5. Urelle TABS Non-Medication  6. Adhesive Tape 7. Contrast Dye 8. Food Dye 9. Latex  Family History Problems  1. Family history of Cancer 2. Family history of leukemia (Z80.6) : Mother 3. Family history of prostate cancer (Z80.42) : Uncle 4. Family history of renal failure (Z84.1) : Aunt 5. Family history of Heart Disease 6. Family history of Nephrolithiasis  Social History Problems  1. Denied: History of Alcohol use 2. Caffeine use (F15.90)   2 3. Disabled 4. Former smoker [phone])   1/2 ppd for 6 years and quit in  2007 5. Marital History - Currently Married 6. Never a smoker 7. Number of children   1 daughter 8. History of Tobacco Use   Smoked 1/2 pack per day for 6  yrs and quit 1 yr ago  Review of Systems No changes to her GI symptoms, no neurological changes.   Vitals Vital Signs [Data Includes: Last 1 Day]  Recorded: 23Jan2017 11:23AM  Blood Pressure: 95 / 66 Temperature: 97.9 F Heart Rate: 94  Physical Exam Constitutional: Well nourished and well developed . No acute distress.  Neck: The appearance of the neck is normal and no neck mass is present.  Pulmonary: No respiratory distress and normal respiratory rhythm and effort.  Cardiovascular: Heart rate and rhythm are normal . No peripheral edema.  Abdomen: The abdomen is soft and nontender. No masses are palpated. No CVA tenderness. No hernias are palpable. No hepatosplenomegaly noted.    Results/Data Urine [Data Includes: Last 1 Day]   [id number]  COLOR YELLOW   APPEARANCE CLEAR   SPECIFIC GRAVITY 1.025   pH 6.5   GLUCOSE NEGATIVE   BILIRUBIN NEGATIVE   KETONE NEGATIVE   BLOOD NEGATIVE   PROTEIN NEGATIVE   NITRITE NEGATIVE   LEUKOCYTE ESTERASE NEGATIVE    Normal UA   Assessment Assessed  1. Bladder pain (R39.89) 2. Chronic interstitial cystitis without hematuria (N30.10)  Plan Health Maintenance  1. UA With REFLEX; [Do Not Release]; Status:Complete;   [id number] 11:03AM  Discussion/Summary The patient has bladder pain, recurrent in nature. She responded quite well tob hydrodistention previously. She is requesting that we perform this again. I did offer to refer her to physical therapy as an alternative modality for treatment. However, the patient is most interested in bladder distention. I discussed the risks and the benefits of this operation. We'll plan to take care of this as soon as possible. In the meantime, I have given the patient a prescription for intravaginal Valium. She cannot take the Valium and her Xanax at the same time. She understands this.

## 2015-03-27 NOTE — Anesthesia Postprocedure Evaluation (Signed)
Anesthesia Post Note  Patient: Kari Green  Procedure(s) Performed: Procedure(s) (LRB): CYSTOSCOPY/HYDRODISTENSION OF BLADDER, INSTILLATION OF MARCAINE/PYRIDIUM (N/A)  Patient location during evaluation: PACU Anesthesia Type: General Level of consciousness: awake and alert and patient cooperative Pain management: pain level controlled Vital Signs Assessment: post-procedure vital signs reviewed and stable Respiratory status: spontaneous breathing and respiratory function stable Cardiovascular status: stable Anesthetic complications: no    Last Vitals:  Filed Vitals:   03/27/15 1045 03/27/15 1100  BP: 160/72 152/91  Pulse: 87 93  Temp:    Resp: 13 14    Last Pain:  Filed Vitals:   03/27/15 1114  PainSc: [place]

## 2015-03-27 NOTE — Anesthesia Procedure Notes (Signed)
Procedure Name: LMA Insertion Date/Time: 03/27/2015 10:00 AM Performed by: Bethena Roys T Pre-anesthesia Checklist: Patient identified, Emergency Drugs available, Suction available and Patient being monitored Patient Re-evaluated:Patient Re-evaluated prior to inductionOxygen Delivery Method: Circle System Utilized Preoxygenation: Pre-oxygenation with 100% oxygen Intubation Type: IV induction Ventilation: Mask ventilation without difficulty LMA: LMA inserted LMA Size: 4.0 Number of attempts: 1 Airway Equipment and Method: Bite block Placement Confirmation: positive ETCO2 Dental Injury: Teeth and Oropharynx as per pre-operative assessment

## 2015-03-27 NOTE — Discharge Instructions (Signed)
Hydrodistention   Definition:  Transurethral Resection of the Bladder Tumor is a surgical procedure used to diagnose and remove tumors within the bladder. TURBT is the most common treatment for early stage bladder cancer.  General instructions:     Your recent bladder surgery requires very little post hospital care but some definite precautions.  You may expect frequency of urination and/or urgency (a stronger desire to urinate) and perhaps even getting up at night more often. This will usually resolve or improve slowly over the healing period. You may see some blood in your urine over the first 6 weeks. Do not be alarmed, even if the urine was clear for a while. Get off your feet and drink lots of fluids until clearing occurs. If you start to pass clots or don't improve call us.  Diet:  You may return to your normal diet immediately. Because of the raw surface of your bladder, alcohol, spicy foods, foods high in acid and drinks with caffeine may cause irritation or frequency and should be used in moderation. To keep your urine flowing freely and avoid constipation, drink plenty of fluids during the day (8-10 glasses). Tip: Avoid cranberry juice because it is very acidic.  Activity:  Your physical activity doesn't need to be restricted. However, if you are very active, you may see some blood in the urine. We suggest that you reduce your activity under the circumstances until the bleeding has stopped.  Bowels:  It is important to keep your bowels regular during the postoperative period. Straining with bowel movements can cause bleeding. A bowel movement every other day is reasonable. Use a mild laxative if needed, such as milk of magnesia 2-3 tablespoons, or 2 Dulcolax tablets. Call if you continue to have problems. If you had been taking narcotics for pain, before, during or after your surgery, you may be constipated. Take a laxative if necessary.    Medication:  You should resume your  pre-surgery medications unless told not to. In addition you may be given an antibiotic to prevent or treat infection. Antibiotics are not always necessary. All medication should be taken as prescribed until the bottles are finished unless you are having an unusual reaction to one of the drugs.  CYSTOSCOPY HOME CARE INSTRUCTIONS  Activity: Rest for the remainder of the day.  Do not drive or operate equipment today.  You may resume normal activities in one to two days as instructed by your physician.   Meals: Drink plenty of liquids and eat light foods such as gelatin or soup this evening.  You may return to a normal meal plan tomorrow.  Return to Work: You may return to work in one to two days or as instructed by your physician.  Special Instructions / Symptoms: Call your physician if any of these symptoms occur:   -persistent or heavy bleeding  -bleeding which continues after first few urination  -large blood clots that are difficult to pass  -urine stream diminishes or stops completely  -fever equal to or higher than 101 degrees Farenheit.  -cloudy urine with a strong, foul odor  -severe pain  Females should always wipe from front to back after elimination.  You may feel some burning pain when you urinate.  This should disappear with time.  Applying moist heat to the lower abdomen or a hot tub bath may help relieve the pain. \  Follow-Up / Date of Return Visit to Your Physician:   Call for an appointment to arrange follow-up.  Patient  Signature:  ________________________________________________________  Nurse's Signature:  ________________________________________________________    Post Anesthesia Home Care Instructions  Activity: Get plenty of rest for the remainder of the day. A responsible adult should stay with you for 24 hours following the procedure.  For the next 24 hours, DO NOT: -Drive a car -Paediatric nurse -Drink alcoholic beverages -Take any medication unless  instructed by your physician -Make any legal decisions or sign important papers.  Meals: Start with liquid foods such as gelatin or soup. Progress to regular foods as tolerated. Avoid greasy, spicy, heavy foods. If nausea and/or vomiting occur, drink only clear liquids until the nausea and/or vomiting subsides. Call your physician if vomiting continues.  Special Instructions/Symptoms: Your throat may feel dry or sore from the anesthesia or the breathing tube placed in your throat during surgery. If this causes discomfort, gargle with warm salt water. The discomfort should disappear within 24 hours.  If you had a scopolamine patch placed behind your ear for the management of post- operative nausea and/or vomiting:  1. The medication in the patch is effective for 72 hours, after which it should be removed.  Wrap patch in a tissue and discard in the trash. Wash hands thoroughly with soap and water. 2. You may remove the patch earlier than 72 hours if you experience unpleasant side effects which may include dry mouth, dizziness or visual disturbances. 3. Avoid touching the patch. Wash your hands with soap and water after contact with the patch.

## 2015-03-27 NOTE — Anesthesia Preprocedure Evaluation (Signed)
Anesthesia Evaluation  Patient identified by MRN, date of birth, ID band Patient awake    Reviewed: Allergy & Precautions, NPO status , Patient's Chart, lab work & pertinent test results  History of Anesthesia Complications (+) PONV and history of anesthetic complications  Airway Mallampati: II   Neck ROM: full    Dental   Pulmonary asthma , COPD, former smoker,    breath sounds clear to auscultation       Cardiovascular negative cardio ROS   Rhythm:regular Rate:Normal     Neuro/Psych  Headaches, Anxiety  Neuromuscular disease    GI/Hepatic hiatal hernia,   Endo/Other  Hypothyroidism   Renal/GU Renal disease     Musculoskeletal  (+) Arthritis , Fibromyalgia -  Abdominal   Peds  Hematology   Anesthesia Other Findings   Reproductive/Obstetrics                             Anesthesia Physical Anesthesia Plan  ASA: III  Anesthesia Plan: General   Post-op Pain Management:    Induction: Intravenous  Airway Management Planned: LMA  Additional Equipment:   Intra-op Plan:   Post-operative Plan:   Informed Consent: I have reviewed the patients History and Physical, chart, labs and discussed the procedure including the risks, benefits and alternatives for the proposed anesthesia with the patient or authorized representative who has indicated his/her understanding and acceptance.     Plan Discussed with: CRNA, Anesthesiologist and Surgeon  Anesthesia Plan Comments:         Anesthesia Quick Evaluation

## 2015-03-27 NOTE — Interval H&P Note (Signed)
History and Physical Interval Note:  03/27/2015 9:12 AM  Kari Green  has presented today for surgery, with the diagnosis of interstitial cystitis  The various methods of treatment have been discussed with the patient and family. After consideration of risks, benefits and other options for treatment, the patient has consented to  Procedure(s): CYSTOSCOPY/HYDRODISTENSION OF BLADDER (N/A) as a surgical intervention .  The patient's history has been reviewed, patient examined, no change in status, stable for surgery.  I have reviewed the patient's chart and labs.  Questions were answered to the patient's satisfaction.     Louis Meckel W

## 2015-03-27 NOTE — Op Note (Addendum)
Preoperative diagnosis:  1. Chronic bladder pain syndrome 2. Interstitial cystitis 3. Increased urinary frequency   Postoperative diagnosis:  1. Same   Procedure: 1. Cystoscopy, hydrodistention 2. Instillation of Marcaine  Surgeon: Ardis Hughs, MD  Anesthesia: General  Complications: None  Intraoperative findings: The patient's bladder capacity was 650 mL's. The the bladder was noted to be hypervascular and after distention there was a significant Amount of oozing of blood diffusely from the small capillaries.  EBL: Minimal  Specimens: None  Indication: Kari Green is a 50 y.o. patient with chronic bladder pain syndrome with recent increased urinary frequency and pain.  After reviewing the management options for treatment, he elected to proceed with the above surgical procedure(s). We have discussed the potential benefits and risks of the procedure, side effects of the proposed treatment, the likelihood of the patient achieving the goals of the procedure, and any potential problems that might occur during the procedure or recuperation. Informed consent has been obtained.  Description of procedure:  The patient was taken to the operating room and general anesthesia was induced.  The patient was placed in the dorsal lithotomy position, prepped and draped in the usual sterile fashion, and preoperative antibiotics were administered. A preoperative time-out was performed.   A 21 French 30 cystoscope was gently passed to the patient's urethra and into the bladder. A 360 cystoscopic evaluation was performed. The bladder mucosa was normal in appearance, there was a hypervascular regions. The ureteral orifices were orthotopic. There were excreting seemingly cloudy urine. The bladder was then completely emptied and then filled with normal saline with the fluid 80 cm above the patient's bladder. The irrigant was dripped in under gravity pressure until the dripping stopped. I then  held the fluid in the bladder for 5 minutes and drained the bladder measuring the bladder capacity of 650 mL's.   I then removed the scope and inserted a 16 French Foley catheter where I instilled 15 mL of half percent Marcaine/pyridium mix.   The patient was subsequently awoken and returned to the PACU in stable condition.  Ardis Hughs, M.D.

## 2015-03-27 NOTE — Transfer of Care (Signed)
Immediate Anesthesia Transfer of Care Note  Patient: Kari Green  Procedure(s) Performed: Procedure(s): CYSTOSCOPY/HYDRODISTENSION OF BLADDER, INSTILLATION OF MARCAINE/PYRIDIUM (N/A)  Patient Location: PACU  Anesthesia Type:General  Level of Consciousness: sedated and responds to stimulation  Airway & Oxygen Therapy: Patient Spontanous Breathing and Patient connected to nasal cannula oxygen  Post-op Assessment: Report given to RN  Post vital signs: Reviewed and stable  Last Vitals:  Filed Vitals:   03/27/15 0806 03/27/15 1030  BP: 143/85 146/85  Pulse: 112 92  Temp: 36.9 C 36.6 C  Resp: 16 14    Complications: No apparent anesthesia complications

## 2015-03-28 ENCOUNTER — Encounter (HOSPITAL_BASED_OUTPATIENT_CLINIC_OR_DEPARTMENT_OTHER): Payer: Self-pay | Admitting: Urology

## 2015-03-31 DIAGNOSIS — M67431 Ganglion, right wrist: Secondary | ICD-10-CM | POA: Diagnosis not present

## 2015-03-31 DIAGNOSIS — M1811 Unilateral primary osteoarthritis of first carpometacarpal joint, right hand: Secondary | ICD-10-CM | POA: Diagnosis not present

## 2015-04-01 DIAGNOSIS — M25512 Pain in left shoulder: Secondary | ICD-10-CM | POA: Diagnosis not present

## 2015-04-01 DIAGNOSIS — Z79891 Long term (current) use of opiate analgesic: Secondary | ICD-10-CM | POA: Diagnosis not present

## 2015-04-01 DIAGNOSIS — M25561 Pain in right knee: Secondary | ICD-10-CM | POA: Diagnosis not present

## 2015-04-01 DIAGNOSIS — M79641 Pain in right hand: Secondary | ICD-10-CM | POA: Diagnosis not present

## 2015-04-01 DIAGNOSIS — G894 Chronic pain syndrome: Secondary | ICD-10-CM | POA: Diagnosis not present

## 2015-04-01 DIAGNOSIS — M545 Low back pain: Secondary | ICD-10-CM | POA: Diagnosis not present

## 2015-04-01 DIAGNOSIS — M79604 Pain in right leg: Secondary | ICD-10-CM | POA: Diagnosis not present

## 2015-04-09 ENCOUNTER — Ambulatory Visit (INDEPENDENT_AMBULATORY_CARE_PROVIDER_SITE_OTHER): Payer: BLUE CROSS/BLUE SHIELD

## 2015-04-09 DIAGNOSIS — M1811 Unilateral primary osteoarthritis of first carpometacarpal joint, right hand: Secondary | ICD-10-CM | POA: Diagnosis not present

## 2015-04-09 DIAGNOSIS — M67431 Ganglion, right wrist: Secondary | ICD-10-CM | POA: Diagnosis not present

## 2015-04-09 DIAGNOSIS — E538 Deficiency of other specified B group vitamins: Secondary | ICD-10-CM | POA: Diagnosis not present

## 2015-04-09 DIAGNOSIS — J309 Allergic rhinitis, unspecified: Secondary | ICD-10-CM

## 2015-04-10 DIAGNOSIS — H9209 Otalgia, unspecified ear: Secondary | ICD-10-CM | POA: Insufficient documentation

## 2015-04-10 DIAGNOSIS — H6060 Unspecified chronic otitis externa, unspecified ear: Secondary | ICD-10-CM | POA: Insufficient documentation

## 2015-04-10 DIAGNOSIS — M797 Fibromyalgia: Secondary | ICD-10-CM | POA: Insufficient documentation

## 2015-04-10 DIAGNOSIS — Z8669 Personal history of other diseases of the nervous system and sense organs: Secondary | ICD-10-CM | POA: Insufficient documentation

## 2015-04-10 DIAGNOSIS — J3489 Other specified disorders of nose and nasal sinuses: Secondary | ICD-10-CM | POA: Insufficient documentation

## 2015-04-10 DIAGNOSIS — H9319 Tinnitus, unspecified ear: Secondary | ICD-10-CM | POA: Insufficient documentation

## 2015-04-10 DIAGNOSIS — M26609 Unspecified temporomandibular joint disorder, unspecified side: Secondary | ICD-10-CM | POA: Insufficient documentation

## 2015-04-10 DIAGNOSIS — M818 Other osteoporosis without current pathological fracture: Secondary | ICD-10-CM | POA: Insufficient documentation

## 2015-04-10 DIAGNOSIS — J342 Deviated nasal septum: Secondary | ICD-10-CM | POA: Insufficient documentation

## 2015-04-10 DIAGNOSIS — H93299 Other abnormal auditory perceptions, unspecified ear: Secondary | ICD-10-CM | POA: Insufficient documentation

## 2015-04-10 DIAGNOSIS — H9190 Unspecified hearing loss, unspecified ear: Secondary | ICD-10-CM | POA: Insufficient documentation

## 2015-04-10 DIAGNOSIS — Z8709 Personal history of other diseases of the respiratory system: Secondary | ICD-10-CM | POA: Insufficient documentation

## 2015-04-15 ENCOUNTER — Ambulatory Visit (INDEPENDENT_AMBULATORY_CARE_PROVIDER_SITE_OTHER): Payer: BLUE CROSS/BLUE SHIELD

## 2015-04-15 DIAGNOSIS — J309 Allergic rhinitis, unspecified: Secondary | ICD-10-CM

## 2015-04-15 DIAGNOSIS — M79672 Pain in left foot: Secondary | ICD-10-CM | POA: Diagnosis not present

## 2015-04-21 ENCOUNTER — Ambulatory Visit: Payer: Self-pay | Admitting: Pain Medicine

## 2015-04-21 ENCOUNTER — Encounter: Payer: Self-pay | Admitting: Pain Medicine

## 2015-04-21 ENCOUNTER — Ambulatory Visit: Payer: BLUE CROSS/BLUE SHIELD | Attending: Pain Medicine | Admitting: Pain Medicine

## 2015-04-21 VITALS — BP 146/86 | HR 79 | Temp 98.0°F | Resp 16 | Ht 62.5 in | Wt 125.0 lb

## 2015-04-21 DIAGNOSIS — G43909 Migraine, unspecified, not intractable, without status migrainosus: Secondary | ICD-10-CM | POA: Diagnosis not present

## 2015-04-21 DIAGNOSIS — M1811 Unilateral primary osteoarthritis of first carpometacarpal joint, right hand: Secondary | ICD-10-CM | POA: Diagnosis not present

## 2015-04-21 DIAGNOSIS — M797 Fibromyalgia: Secondary | ICD-10-CM | POA: Diagnosis not present

## 2015-04-21 DIAGNOSIS — G8929 Other chronic pain: Secondary | ICD-10-CM | POA: Insufficient documentation

## 2015-04-21 DIAGNOSIS — J3489 Other specified disorders of nose and nasal sinuses: Secondary | ICD-10-CM | POA: Insufficient documentation

## 2015-04-21 DIAGNOSIS — M25512 Pain in left shoulder: Secondary | ICD-10-CM | POA: Insufficient documentation

## 2015-04-21 DIAGNOSIS — G90521 Complex regional pain syndrome I of right lower limb: Secondary | ICD-10-CM | POA: Diagnosis not present

## 2015-04-21 DIAGNOSIS — J449 Chronic obstructive pulmonary disease, unspecified: Secondary | ICD-10-CM | POA: Insufficient documentation

## 2015-04-21 DIAGNOSIS — K589 Irritable bowel syndrome without diarrhea: Secondary | ICD-10-CM | POA: Diagnosis not present

## 2015-04-21 DIAGNOSIS — F411 Generalized anxiety disorder: Secondary | ICD-10-CM | POA: Insufficient documentation

## 2015-04-21 DIAGNOSIS — H9209 Otalgia, unspecified ear: Secondary | ICD-10-CM | POA: Insufficient documentation

## 2015-04-21 DIAGNOSIS — M25559 Pain in unspecified hip: Secondary | ICD-10-CM | POA: Diagnosis present

## 2015-04-21 DIAGNOSIS — J45901 Unspecified asthma with (acute) exacerbation: Secondary | ICD-10-CM | POA: Diagnosis not present

## 2015-04-21 DIAGNOSIS — M79604 Pain in right leg: Secondary | ICD-10-CM

## 2015-04-21 DIAGNOSIS — R1013 Epigastric pain: Secondary | ICD-10-CM | POA: Insufficient documentation

## 2015-04-21 DIAGNOSIS — M25561 Pain in right knee: Secondary | ICD-10-CM | POA: Diagnosis not present

## 2015-04-21 DIAGNOSIS — Z9889 Other specified postprocedural states: Secondary | ICD-10-CM | POA: Insufficient documentation

## 2015-04-21 DIAGNOSIS — E039 Hypothyroidism, unspecified: Secondary | ICD-10-CM | POA: Insufficient documentation

## 2015-04-21 DIAGNOSIS — Z87891 Personal history of nicotine dependence: Secondary | ICD-10-CM | POA: Insufficient documentation

## 2015-04-21 DIAGNOSIS — M810 Age-related osteoporosis without current pathological fracture: Secondary | ICD-10-CM | POA: Diagnosis not present

## 2015-04-21 DIAGNOSIS — M545 Low back pain: Secondary | ICD-10-CM | POA: Diagnosis not present

## 2015-04-21 DIAGNOSIS — G90529 Complex regional pain syndrome I of unspecified lower limb: Secondary | ICD-10-CM | POA: Diagnosis not present

## 2015-04-21 DIAGNOSIS — M67431 Ganglion, right wrist: Secondary | ICD-10-CM | POA: Diagnosis not present

## 2015-04-21 DIAGNOSIS — Z79891 Long term (current) use of opiate analgesic: Secondary | ICD-10-CM | POA: Diagnosis not present

## 2015-04-21 DIAGNOSIS — Z87442 Personal history of urinary calculi: Secondary | ICD-10-CM | POA: Insufficient documentation

## 2015-04-21 DIAGNOSIS — M25569 Pain in unspecified knee: Secondary | ICD-10-CM | POA: Diagnosis present

## 2015-04-21 NOTE — Progress Notes (Signed)
Safety precautions to be maintained throughout the outpatient stay will include: orient to surroundings, keep bed in low position, maintain call bell within reach at all times, provide assistance with transfer out of bed and ambulation.  Did not bring sheet with her today - forgot it on the counter at home

## 2015-04-21 NOTE — Patient Instructions (Addendum)
Radiofrequency Lesioning Radiofrequency lesioning is a procedure that is performed to relieve pain. The procedure is often used for back, neck, or arm pain. Radiofrequency lesioning involves the use of a machine that creates radio waves to make heat. During the procedure, the heat is applied to the nerve that carries the pain signal. The heat damages the nerve and interferes with the pain signal. Pain relief usually lasts for 6 months to 1 year. LET [facility] CARE PROVIDER KNOW ABOUT: 1. Any allergies you have. 2. All medicines you are taking, including vitamins, herbs, eye drops, creams, and over-the-counter medicines. 3. Previous problems you or members of your family have had with the use of anesthetics. 4. Any blood disorders you have. 5. Previous surgeries you have had. 6. Any medical conditions you have. 7. Whether you are pregnant or may be pregnant. RISKS AND COMPLICATIONS Generally, this is a safe procedure. However, problems may occur, including:  Pain or soreness at the injection site.  Infection at the injection site.  Damage to nerves or blood vessels. BEFORE THE PROCEDURE 1. Ask your health care provider about: 1. Changing or stopping your regular medicines. This is especially important if you are taking diabetes medicines or blood thinners. 2. Taking medicines such as aspirin and ibuprofen. These medicines can thin your blood. Do not take these medicines before your procedure if your health care provider instructs you not to. 2. Follow instructions from your health care provider about eating or drinking restrictions. 3. Plan to have someone take you home after the procedure. 4. If you go home right after the procedure, plan to have someone with you for 24 hours. PROCEDURE 1. You will be given one or more of the following: 1. A medicine to help you relax (sedative). 2. A medicine to numb the area (local anesthetic). 2. You will be awake during the procedure. You will need  to be able to talk with the health care provider during the procedure. 3. With the help of a type of X-ray (fluoroscopy), the health care provider will insert a radiofrequency needle into the area to be treated. 4. Next, a wire that carries the radio waves (electrode) will be put through the radiofrequency needle. An electrical pulse will be sent through the electrode to verify the correct nerve. You will feel a tingling sensation, and you may have muscle twitching. 5. Then, the tissue that is around the needle tip will be heated by an electric current that is passed using the radiofrequency machine. This will numb the nerves. 6. A bandage (dressing) will be put on the insertion area after the procedure is done. The procedure may vary among health care providers and hospitals. AFTER THE PROCEDURE 1. Your blood pressure, heart rate, breathing rate, and blood oxygen level will be monitored often until the medicines you were given have worn off. 2. Return to your normal activities as directed by your health care provider.   This information is not intended to replace advice given to you by your health care provider. Make sure you discuss any questions you have with your health care provider.   Document Released: 10/07/2010 Document Revised: 10/30/2014 Document Reviewed: 03/18/2014 Elsevier Interactive Patient Education 2016 [place]  What are the risk, side effects and possible complications? Generally speaking, most procedures are safe.  However, with any procedure there are risks, side effects, and the possibility of complications.  The risks and complications are dependent upon the sites that are lesioned, or the  type of nerve block to be performed.  The closer the procedure is to the spine, the more serious the risks are.  Great care is taken when placing the radio frequency needles, block needles or lesioning probes, but sometimes complications can  occur. 1. Infection: Any time there is an injection through the skin, there is a risk of infection.  This is why sterile conditions are used for these blocks.  There are four possible types of infection. 1. Localized skin infection. 2. Central Nervous System Infection-This can be in the form of Meningitis, which can be deadly. 3. Epidural Infections-This can be in the form of an epidural abscess, which can cause pressure inside of the spine, causing compression of the spinal cord with subsequent paralysis. This would require an emergency surgery to decompress, and there are no guarantees that the patient would recover from the paralysis. 4. Discitis-This is an infection of the intervertebral discs.  It occurs in about 1% of discography procedures.  It is difficult to treat and it may lead to surgery.        2. Pain: the needles have to go through skin and soft tissues, will cause soreness.       3. Damage to internal structures:  The nerves to be lesioned may be near blood vessels or    other nerves which can be potentially damaged.       4. Bleeding: Bleeding is more common if the patient is taking blood thinners such as  aspirin, Coumadin, Ticiid, Plavix, etc., or if he/she have some genetic predisposition  such as hemophilia. Bleeding into the spinal canal can cause compression of the spinal  cord with subsequent paralysis.  This would require an emergency surgery to  decompress and there are no guarantees that the patient would recover from the  paralysis.       5. Pneumothorax:  Puncturing of a lung is a possibility, every time a needle is introduced in  the area of the chest or upper back.  Pneumothorax refers to free air around the  collapsed lung(s), inside of the thoracic cavity (chest cavity).  Another two possible  complications related to a similar event would include: Hemothorax and Chylothorax.   These are variations of the Pneumothorax, where instead of air around the collapsed  lung(s),  you may have blood or chyle, respectively.       6. Spinal headaches: They may occur with any procedures in the area of the spine.       7. Persistent CSF (Cerebro-Spinal Fluid) leakage: This is a rare problem, but may occur  with prolonged intrathecal or epidural catheters either due to the formation of a fistulous  track or a dural tear.       8. Nerve damage: By working so close to the spinal cord, there is always a possibility of  nerve damage, which could be as serious as a permanent spinal cord injury with  paralysis.       9. Death:  Although rare, severe deadly allergic reactions known as "Anaphylactic  reaction" can occur to any of the medications used.      10. Worsening of the symptoms:  We can always make thing worse.  What are the chances of something like this happening? Chances of any of this occuring are extremely low.  By statistics, you have more of a chance of getting killed in a motor vehicle accident: while driving to the hospital than any of the above occurring .  Nevertheless, you should be aware that they are possibilities.  In general, it is similar to taking a shower.  Everybody knows that you can slip, hit your head and get killed.  Does that mean that you should not shower again?  Nevertheless always keep in mind that statistics do not mean anything if you happen to be on the wrong side of them.  Even if a procedure has a 1 (one) in a 1,000,000 (million) chance of going wrong, it you happen to be that one..Also, keep in mind that by statistics, you have more of a chance of having something go wrong when taking medications.  Who should not have this procedure? If you are on a blood thinning medication (e.g. Coumadin, Plavix, see list of "Blood Thinners"), or if you have an active infection going on, you should not have the procedure.  If you are taking any blood thinners, please inform your physician.  How should I prepare for this procedure?  Do not eat or drink anything at  least six hours prior to the procedure.  Bring a driver with you .  It cannot be a taxi.  Come accompanied by an adult that can drive you back, and that is strong enough to help you if your legs get weak or numb from the local anesthetic.  Take all of your medicines the morning of the procedure with just enough water to swallow them.  If you have diabetes, make sure that you are scheduled to have your procedure done first thing in the morning, whenever possible.  If you have diabetes, take only half of your insulin dose and notify our nurse that you have done so as soon as you arrive at the clinic.  If you are diabetic, but only take blood sugar pills (oral hypoglycemic), then do not take them on the morning of your procedure.  You may take them after you have had the procedure.  Do not take aspirin or any aspirin-containing medications, at least eleven (11) days prior to the procedure.  They may prolong bleeding.  Wear loose fitting clothing that may be easy to take off and that you would not mind if it got stained with Betadine or blood.  Do not wear any jewelry or perfume  Remove any nail coloring.  It will interfere with some of our monitoring equipment.  NOTE: Remember that this is not meant to be interpreted as a complete list of all possible complications.  Unforeseen problems may occur.  BLOOD THINNERS The following drugs contain aspirin or other products, which can cause increased bleeding during surgery and should not be taken for 2 weeks prior to and 1 week after surgery.  If you should need take something for relief of minor pain, you may take acetaminophen which is found in Tylenol,m Datril, Anacin-3 and Panadol. It is not blood thinner. The products listed below are.  Do not take any of the products listed below in addition to any listed on your instruction sheet.  A.P.C or A.P.C with Codeine Codeine Phosphate Capsules #3 Ibuprofen Ridaura  ABC compound Congesprin Imuran  rimadil  Advil Cope Indocin Robaxisal  Alka-Seltzer Effervescent Pain Reliever and Antacid Coricidin or Coricidin-D  Indomethacin Rufen  Alka-Seltzer plus Cold Medicine Cosprin Ketoprofen S-A-C Tablets  Anacin Analgesic Tablets or Capsules Coumadin Korlgesic Salflex  Anacin Extra Strength Analgesic tablets or capsules CP-2 Tablets Lanoril Salicylate  Anaprox Cuprimine Capsules Levenox Salocol  Anexsia-D Dalteparin Magan Salsalate  Anodynos Darvon compound Magnesium Salicylate Sine-off  Ansaid Dasin Capsules Magsal Sodium Salicylate  Anturane Depen Capsules Marnal Soma  APF Arthritis pain formula Dewitt's Pills Measurin Stanback  Argesic Dia-Gesic Meclofenamic Sulfinpyrazone  Arthritis Bayer Timed Release Aspirin Diclofenac Meclomen Sulindac  Arthritis pain formula Anacin Dicumarol Medipren Supac  Analgesic (Safety coated) Arthralgen Diffunasal Mefanamic Suprofen  Arthritis Strength Bufferin Dihydrocodeine Mepro Compound Suprol  Arthropan liquid Dopirydamole Methcarbomol with Aspirin Synalgos  ASA tablets/Enseals Disalcid Micrainin Tagament  Ascriptin Doan's Midol Talwin  Ascriptin A/D Dolene Mobidin Tanderil  Ascriptin Extra Strength Dolobid Moblgesic Ticlid  Ascriptin with Codeine Doloprin or Doloprin with Codeine Momentum Tolectin  Asperbuf Duoprin Mono-gesic Trendar  Aspergum Duradyne Motrin or Motrin IB Triminicin  Aspirin plain, buffered or enteric coated Durasal Myochrisine Trigesic  Aspirin Suppositories Easprin Nalfon Trillsate  Aspirin with Codeine Ecotrin Regular or Extra Strength Naprosyn Uracel  Atromid-S Efficin Naproxen Ursinus  Auranofin Capsules Elmiron Neocylate Vanquish  Axotal Emagrin Norgesic Verin  Azathioprine Empirin or Empirin with Codeine Normiflo Vitamin E  Azolid Emprazil Nuprin Voltaren  Bayer Aspirin plain, buffered or children's or timed BC Tablets or powders Encaprin Orgaran Warfarin Sodium  Buff-a-Comp Enoxaparin Orudis Zorpin  Buff-a-Comp with  Codeine Equegesic Os-Cal-Gesic   Buffaprin Excedrin plain, buffered or Extra Strength Oxalid   Bufferin Arthritis Strength Feldene Oxphenbutazone   Bufferin plain or Extra Strength Feldene Capsules Oxycodone with Aspirin   Bufferin with Codeine Fenoprofen Fenoprofen Pabalate or Pabalate-SF   Buffets II Flogesic Panagesic   Buffinol plain or Extra Strength Florinal or Florinal with Codeine Panwarfarin   Buf-Tabs Flurbiprofen Penicillamine   Butalbital Compound Four-way cold tablets Penicillin   Butazolidin Fragmin Pepto-Bismol   Carbenicillin Geminisyn Percodan   Carna Arthritis Reliever Geopen Persantine   Carprofen Gold's salt Persistin   Chloramphenicol Goody's Phenylbutazone   Chloromycetin Haltrain Piroxlcam   Clmetidine heparin Plaquenil   Cllnoril Hyco-pap Ponstel   Clofibrate Hydroxy chloroquine Propoxyphen         Before stopping any of these medications, be sure to consult the physician who ordered them.  Some, such as Coumadin (Warfarin) are ordered to prevent or treat serious conditions such as "deep thrombosis", "pumonary embolisms", and other heart problems.  The amount of time that you may need off of the medication may also vary with the medication and the reason for which you were taking it.  If you are taking any of these medications, please make sure you notify your pain physician before you undergo any procedures.         GENERAL RISKS AND COMPLICATIONS  What are the risk, side effects and possible complications? Generally speaking, most procedures are safe.  However, with any procedure there are risks, side effects, and the possibility of complications.  The risks and complications are dependent upon the sites that are lesioned, or the type of nerve block to be performed.  The closer the procedure is to the spine, the more serious the risks are.  Great care is taken when placing the radio frequency needles, block needles or lesioning probes, but sometimes  complications can occur. 2. Infection: Any time there is an injection through the skin, there is a risk of infection.  This is why sterile conditions are used for these blocks.  There are four possible types of infection. 1. Localized skin infection. 2. Central Nervous System Infection-This can be in the form of Meningitis, which can be deadly. 3. Epidural Infections-This can be in the form of an epidural abscess, which can cause pressure inside of the spine, causing compression of  the spinal cord with subsequent paralysis. This would require an emergency surgery to decompress, and there are no guarantees that the patient would recover from the paralysis. 4. Discitis-This is an infection of the intervertebral discs.  It occurs in about 1% of discography procedures.  It is difficult to treat and it may lead to surgery.        2. Pain: the needles have to go through skin and soft tissues, will cause soreness.       3. Damage to internal structures:  The nerves to be lesioned may be near blood vessels or    other nerves which can be potentially damaged.       4. Bleeding: Bleeding is more common if the patient is taking blood thinners such as  aspirin, Coumadin, Ticiid, Plavix, etc., or if he/she have some genetic predisposition  such as hemophilia. Bleeding into the spinal canal can cause compression of the spinal  cord with subsequent paralysis.  This would require an emergency surgery to  decompress and there are no guarantees that the patient would recover from the  paralysis.       5. Pneumothorax:  Puncturing of a lung is a possibility, every time a needle is introduced in  the area of the chest or upper back.  Pneumothorax refers to free air around the  collapsed lung(s), inside of the thoracic cavity (chest cavity).  Another two possible  complications related to a similar event would include: Hemothorax and Chylothorax.   These are variations of the Pneumothorax, where instead of air around the  collapsed  lung(s), you may have blood or chyle, respectively.       6. Spinal headaches: They may occur with any procedures in the area of the spine.       7. Persistent CSF (Cerebro-Spinal Fluid) leakage: This is a rare problem, but may occur  with prolonged intrathecal or epidural catheters either due to the formation of a fistulous  track or a dural tear.       8. Nerve damage: By working so close to the spinal cord, there is always a possibility of  nerve damage, which could be as serious as a permanent spinal cord injury with  paralysis.       9. Death:  Although rare, severe deadly allergic reactions known as "Anaphylactic  reaction" can occur to any of the medications used.      10. Worsening of the symptoms:  We can always make thing worse.  What are the chances of something like this happening? Chances of any of this occuring are extremely low.  By statistics, you have more of a chance of getting killed in a motor vehicle accident: while driving to the hospital than any of the above occurring .  Nevertheless, you should be aware that they are possibilities.  In general, it is similar to taking a shower.  Everybody knows that you can slip, hit your head and get killed.  Does that mean that you should not shower again?  Nevertheless always keep in mind that statistics do not mean anything if you happen to be on the wrong side of them.  Even if a procedure has a 1 (one) in a 1,000,000 (million) chance of going wrong, it you happen to be that one..Also, keep in mind that by statistics, you have more of a chance of having something go wrong when taking medications.  Who should not have this procedure? If you are on a blood thinning  medication (e.g. Coumadin, Plavix, see list of "Blood Thinners"), or if you have an active infection going on, you should not have the procedure.  If you are taking any blood thinners, please inform your physician.  How should I prepare for this procedure?  Do not eat  or drink anything at least six hours prior to the procedure.  Bring a driver with you .  It cannot be a taxi.  Come accompanied by an adult that can drive you back, and that is strong enough to help you if your legs get weak or numb from the local anesthetic.  Take all of your medicines the morning of the procedure with just enough water to swallow them.  If you have diabetes, make sure that you are scheduled to have your procedure done first thing in the morning, whenever possible.  If you have diabetes, take only half of your insulin dose and notify our nurse that you have done so as soon as you arrive at the clinic.  If you are diabetic, but only take blood sugar pills (oral hypoglycemic), then do not take them on the morning of your procedure.  You may take them after you have had the procedure.  Do not take aspirin or any aspirin-containing medications, at least eleven (11) days prior to the procedure.  They may prolong bleeding.  Wear loose fitting clothing that may be easy to take off and that you would not mind if it got stained with Betadine or blood.  Do not wear any jewelry or perfume  Remove any nail coloring.  It will interfere with some of our monitoring equipment.  NOTE: Remember that this is not meant to be interpreted as a complete list of all possible complications.  Unforeseen problems may occur.  BLOOD THINNERS The following drugs contain aspirin or other products, which can cause increased bleeding during surgery and should not be taken for 2 weeks prior to and 1 week after surgery.  If you should need take something for relief of minor pain, you may take acetaminophen which is found in Tylenol,m Datril, Anacin-3 and Panadol. It is not blood thinner. The products listed below are.  Do not take any of the products listed below in addition to any listed on your instruction sheet.  A.P.C or A.P.C with Codeine Codeine Phosphate Capsules #3 Ibuprofen Ridaura  ABC compound  Congesprin Imuran rimadil  Advil Cope Indocin Robaxisal  Alka-Seltzer Effervescent Pain Reliever and Antacid Coricidin or Coricidin-D  Indomethacin Rufen  Alka-Seltzer plus Cold Medicine Cosprin Ketoprofen S-A-C Tablets  Anacin Analgesic Tablets or Capsules Coumadin Korlgesic Salflex  Anacin Extra Strength Analgesic tablets or capsules CP-2 Tablets Lanoril Salicylate  Anaprox Cuprimine Capsules Levenox Salocol  Anexsia-D Dalteparin Magan Salsalate  Anodynos Darvon compound Magnesium Salicylate Sine-off  Ansaid Dasin Capsules Magsal Sodium Salicylate  Anturane Depen Capsules Marnal Soma  APF Arthritis pain formula Dewitt's Pills Measurin Stanback  Argesic Dia-Gesic Meclofenamic Sulfinpyrazone  Arthritis Bayer Timed Release Aspirin Diclofenac Meclomen Sulindac  Arthritis pain formula Anacin Dicumarol Medipren Supac  Analgesic (Safety coated) Arthralgen Diffunasal Mefanamic Suprofen  Arthritis Strength Bufferin Dihydrocodeine Mepro Compound Suprol  Arthropan liquid Dopirydamole Methcarbomol with Aspirin Synalgos  ASA tablets/Enseals Disalcid Micrainin Tagament  Ascriptin Doan's Midol Talwin  Ascriptin A/D Dolene Mobidin Tanderil  Ascriptin Extra Strength Dolobid Moblgesic Ticlid  Ascriptin with Codeine Doloprin or Doloprin with Codeine Momentum Tolectin  Asperbuf Duoprin Mono-gesic Trendar  Aspergum Duradyne Motrin or Motrin IB Triminicin  Aspirin plain, buffered or enteric coated Durasal  Myochrisine Trigesic  Aspirin Suppositories Easprin Nalfon Trillsate  Aspirin with Codeine Ecotrin Regular or Extra Strength Naprosyn Uracel  Atromid-S Efficin Naproxen Ursinus  Auranofin Capsules Elmiron Neocylate Vanquish  Axotal Emagrin Norgesic Verin  Azathioprine Empirin or Empirin with Codeine Normiflo Vitamin E  Azolid Emprazil Nuprin Voltaren  Bayer Aspirin plain, buffered or children's or timed BC Tablets or powders Encaprin Orgaran Warfarin Sodium  Buff-a-Comp Enoxaparin Orudis Zorpin   Buff-a-Comp with Codeine Equegesic Os-Cal-Gesic   Buffaprin Excedrin plain, buffered or Extra Strength Oxalid   Bufferin Arthritis Strength Feldene Oxphenbutazone   Bufferin plain or Extra Strength Feldene Capsules Oxycodone with Aspirin   Bufferin with Codeine Fenoprofen Fenoprofen Pabalate or Pabalate-SF   Buffets II Flogesic Panagesic   Buffinol plain or Extra Strength Florinal or Florinal with Codeine Panwarfarin   Buf-Tabs Flurbiprofen Penicillamine   Butalbital Compound Four-way cold tablets Penicillin   Butazolidin Fragmin Pepto-Bismol   Carbenicillin Geminisyn Percodan   Carna Arthritis Reliever Geopen Persantine   Carprofen Gold's salt Persistin   Chloramphenicol Goody's Phenylbutazone   Chloromycetin Haltrain Piroxlcam   Clmetidine heparin Plaquenil   Cllnoril Hyco-pap Ponstel   Clofibrate Hydroxy chloroquine Propoxyphen         Before stopping any of these medications, be sure to consult the physician who ordered them.  Some, such as Coumadin (Warfarin) are ordered to prevent or treat serious conditions such as "deep thrombosis", "pumonary embolisms", and other heart problems.  The amount of time that you may need off of the medication may also vary with the medication and the reason for which you were taking it.  If you are taking any of these medications, please make sure you notify your pain physician before you undergo any procedures.         Radiofrequency Lesioning Radiofrequency lesioning is a procedure that is performed to relieve pain. The procedure is often used for back, neck, or arm pain. Radiofrequency lesioning involves the use of a machine that creates radio waves to make heat. During the procedure, the heat is applied to the nerve that carries the pain signal. The heat damages the nerve and interferes with the pain signal. Pain relief usually lasts for 6 months to 1 year. LET [facility] CARE PROVIDER KNOW ABOUT: 8. Any allergies you have. 9. All  medicines you are taking, including vitamins, herbs, eye drops, creams, and over-the-counter medicines. 10. Previous problems you or members of your family have had with the use of anesthetics. 11. Any blood disorders you have. 12. Previous surgeries you have had. 13. Any medical conditions you have. 14. Whether you are pregnant or may be pregnant. RISKS AND COMPLICATIONS Generally, this is a safe procedure. However, problems may occur, including:  Pain or soreness at the injection site.  Infection at the injection site.  Damage to nerves or blood vessels. BEFORE THE PROCEDURE 5. Ask your health care provider about: 1. Changing or stopping your regular medicines. This is especially important if you are taking diabetes medicines or blood thinners. 2. Taking medicines such as aspirin and ibuprofen. These medicines can thin your blood. Do not take these medicines before your procedure if your health care provider instructs you not to. 6. Follow instructions from your health care provider about eating or drinking restrictions. 7. Plan to have someone take you home after the procedure. 8. If you go home right after the procedure, plan to have someone with you for 24 hours. PROCEDURE 7. You will be given one or more of the following:  1. A medicine to help you relax (sedative). 2. A medicine to numb the area (local anesthetic). 8. You will be awake during the procedure. You will need to be able to talk with the health care provider during the procedure. 9. With the help of a type of X-ray (fluoroscopy), the health care provider will insert a radiofrequency needle into the area to be treated. 10. Next, a wire that carries the radio waves (electrode) will be put through the radiofrequency needle. An electrical pulse will be sent through the electrode to verify the correct nerve. You will feel a tingling sensation, and you may have muscle twitching. 11. Then, the tissue that is around the needle  tip will be heated by an electric current that is passed using the radiofrequency machine. This will numb the nerves. 12. A bandage (dressing) will be put on the insertion area after the procedure is done. The procedure may vary among health care providers and hospitals. AFTER THE PROCEDURE 23. Your blood pressure, heart rate, breathing rate, and blood oxygen level will be monitored often until the medicines you were given have worn off. 24. Return to your normal activities as directed by your health care provider.   This information is not intended to replace advice given to you by your health care provider. Make sure you discuss any questions you have with your health care provider.   Document Released: 10/07/2010 Document Revised: 10/30/2014 Document Reviewed: 03/18/2014 Elsevier Interactive Patient Education 2016 [employer] Lumbar Sympathetic Block Patient Information  Description: The lumbar plexus is a group of nerves that are part of the sympathetic nervous system.  These nerves supply organs in the pelvis and legs.  Lumbar sympathetic blocks are utilized for the diagnosis and treatment of painful conditions in these areas.   The lumbar plexus is located on both sides of the aorta at approximately the level of the second lumbar vertebral body.  The block will be performed with you lying on your abdomen with a pillow underneath.  Using direct x-ray guidance,   The plexus will be located on both sides of the spine.  Numbing medicine will be used to deaden the skin prior to needle insertion.  In most cases, a small amount of sedation can be give by IV prior to the numbing medicine.  One or two small needles will be placed near the plexus and local anesthetic will be injected.  This may make your leg(s) feel warm.  The Entire block usually lasts about 15-25 minutes.  Conditions which may be treated by lumbar sympathetic block:   Reflex sympathetic dystrophy  Phantom limb  pain  Peripheral neuropathy  Peripheral vascular disease ( inadequate blood flow )  Cancer pain of pelvis, leg and kidney  Preparation for the injection:  9. Do note eat any solid food or diary products within 6 hours of your appointment. 10. You may drink clear liquids up to 2 hours before appointment.  Clear liquids include water, black coffee, juice or soda.  No milk or cream please. 11. You may take your regular medication, including pain medications, with a sip of water before you appointment.  Diabetics should hold regular insulin ( if taken separately ) and take 1/2 NPH dose the morning of the procedure .  Carry some sugar containing items with you to your appointment. 12. A driver must accompany you and be prepared to drive you home after your procedure. 13. Bring all your current medication with you. 14. An IV may be  inserted and sedation may be given at the discretion of the physician.  15. A blood pressure cuff, EKG and other monitors will often be applied during the procedure.  Some patients may need to have extra oxygen administered for a short period. 38. You will be asked to provide medical information, including your allergies and medications, prior to the procedure.  We must know immediately if your taking blood thinners (like Coumadin/Warfarin) or if you are allergic to IV iodine contrast (dye).  We must know if you could possibly be pregnant.  Possible side-effects   Bleeding from needle site or deeper  Infection (rare, can require surgery)  Nerve injury (rare)  Numbness & tingling (temporary)  Collapsed lung (rare)  Spinal headache (a headache worse with upright posture)  Light-headedness (temporary)  Pain at injection site (several days)  Decreased blood pressure (temporary)  Weakness in legs (temporary)  Seizure or other drug reaction (rare)  Call if you experience:   Fever/chills associated with headache or increased back/ neck pain  Headache  worsened by an upright position  New onset weakness or numbness of an extremity below the injection site  Hives or difficulty breathing ( go to the emergency room)  Inflammation or drainage at the injections site(s)  New symptoms which are concerning to you  Please note:  If effective, we will often do a series of 2-3 injections spaced 3-6 weeks apart to maximally decrease your pain.  If initial series is effective, you may be a candidate for a more permanent block of the lumbar sympathetic plexus.  If you have any questions please call [phone] [facility]

## 2015-04-21 NOTE — Progress Notes (Signed)
Patient's Name: Kari Green MRN: [id number] DOB: 04-Aug-1965 DOS: 04/21/2015  Primary Reason(s) for Visit: Post-Procedure evaluation CC: Knee Pain and Hip Pain   HPI  Kari Green is a 50 y.o. year old, female patient, who returns today as an established patient. She has Irritable bowel syndrome with constipation; Abdominal pain, epigastric; Abdominal adhesions; COPD (chronic obstructive pulmonary disease) (Kari Green); Hypothyroidism; Left shoulder pain; Memory deficits; Asthma; Allergic rhinitis; Chronic low back pain; CRPS (complex regional pain syndrome) type I of lower limb (Right-sided); History of arthroplasty of right knee; Chronic pain syndrome; Chronic pain; Chronic knee pain (Location of Secondary source of pain) (Right); Generalized anxiety disorder; Chronic lower extremity pain (Location of Primary Source of Pain) (Right); Multiple drug allergies (codeine, Lidoderm a DC if, sulfa, latex, IVP dye, Keflex, Augmentin); Latex allergy; Long term current use of opiate analgesic; Long term prescription opiate use; Opiate use; Opiate dependence (Kari Green); History of allergy to IVP dye; Asthma with acute exacerbation; Temporomandibular joint disorder; Buzzing in ear; Ear pain, referred; OP (osteoporosis); Nasal septal perforation; History of migraine headaches; Difficulty hearing; H/O respiratory system disease; Fibromyalgia; Deflected nasal septum; Chronic otitis externa; and Abnormal auditory perception on her problem list.. Her primarily concern today is the Knee Pain and Hip Pain   The patient comes into clinic today for postprocedure evaluation. She indicates that she thinks she has attained 100% relief of the knee pain. However, it is difficult for her to tell based on the fact that she continues to experience the CRPS pain on the same leg. However, her range of motion has improved since the day of frequency. At this point, we need to schedule the patient for a repeat lumbar sympathetic  radiofrequency ablation. The last one was done more than 6 months ago.  Pain Assessment: Self-Reported Pain Score: 4  Reported level is compatible with observation Pain Type: Chronic pain Pain Location: Knee Pain Descriptors / Indicators: Aching, Constant, Dull, Throbbing, Nagging Pain Frequency: Constant  Date of Last Visit: 03/11/15 Service Provided on Last Visit: Procedure  Controlled Substance Pharmacotherapy Assessment  Analgesic: Nucynta 100 mg 1 tablet by mouth 3 times a day. (300 mg/day). This medication has been written by her primary pain physician. MME/day: 120 mg/day  Risk Assessment: Substance Use Disorder (SUD) Risk Level: Low Opioid Risk Tool (ORT) Score: Total Score: 0 Low Risk for SUD (Score <3) Depression Scale Score:    Pharmacologic Plan: Continue therapy as is. Medication will continue to be written by her primary pain physician.  Lab Work: Illicit Drugs No results found for: THCU, COCAINSCRNUR, PCPSCRNUR, MDMA, AMPHETMU, METHADONE, ETOH  Inflammation Markers No results found for: ESRSEDRATE, CRP  Renal Function Lab Results  Component Value Date   BUN 15 09/08/2013   CREATININE 0.72 09/08/2013   GFRAA >90 09/08/2013   GFRNONAA >90 09/08/2013    Hepatic Function Lab Results  Component Value Date   AST 22 04/26/2011   ALT 11 04/26/2011   ALBUMIN 4.1 04/26/2011    Electrolytes Lab Results  Component Value Date   NA 141 09/08/2013   K 3.7 09/08/2013   CL 104 09/08/2013   CALCIUM 8.6 09/08/2013    Post-Procedure Assessment  Procedure done on last visit: Palliative, superior-lateral, superior-medial, and inferior medial, Genicular nerve radiofrequency ablation under fluoroscopic guidance and IV sedation for the right knee. Side-effects or Adverse reactions: None reported Sedation: Procedure was performed with sedation  Results: Ultra-Short Term Relief (First 1 hour after procedure): 100 %  Possibly the results is influenced by  the  pharmacodynamic effect of the local anesthetic, as well as that of the intravenous analgesics and/or sedatives, when used Short Term Relief (Initial 4-6 hrs after procedure): 100 % Short-term relief confirms injected site to be the source of pain Long Term Relief : 75 % Long-term benefit would suggest an inflammatory etiology to the pain   Current Relief (Now):  75%  This is secondary to the neurolytic effect of the radiofrequency. Interpretation of Results: Clearly this has worked very well for her.  Allergies  Kari Green is allergic to iodinated diagnostic agents; red dye; chloraprep one step; cobalt; corticosteroids; eggs or egg-derived products; latex; morphine and related; nickel; other; peanuts; supartz; surgical lubricant; tape; yellow dyes (non-tartrazine); azithromycin; flexeril; and soap.  Meds  The patient has a current medication list which includes the following prescription(s): albuterol, alprazolam, cyanocobalamin, diphenhydramine, epinephrine, estradiol, evzio, fluticasone, fluticasone-salmeterol, hydroxyzine, ketoconazole, levothyroxine, mometasone-formoterol, promethazine, topiramate, UNABLE TO FIND, zoledronic acid, mirabegron er, and nucynta.  Current Outpatient Prescriptions on File Prior to Visit  Medication Sig  . albuterol (PROVENTIL) (2.5 MG/3ML) 0.083% nebulizer solution Take 3 mLs (2.5 mg total) by nebulization every 4 (four) hours as needed for wheezing (or coughing spells).  . ALPRAZolam (XANAX) 0.5 MG tablet Take 0.5 mg by mouth 3 (three) times daily as needed. Anxiety  . cyanocobalamin (,VITAMIN B-12,) 1000 MCG/ML injection Inject 1,000 mcg into the muscle once a week. Reported on 03/25/2015  . diphenhydrAMINE (BENADRYL) 50 MG tablet Take 50 mg by mouth every 6 (six) hours as needed (prn , alternates with hydroxyzine).   Marland Kitchen EPINEPHrine (EPIPEN 2-PAK) 0.3 mg/0.3 mL IJ SOAJ injection Inject 0.3 mLs (0.3 mg total) into the muscle as needed (in the event of a severe  life-threatening reaction).  Marland Kitchen estradiol (ESTRACE) 0.5 MG tablet Take 0.5 mg by mouth at bedtime.   Marland Kitchen EVZIO 0.4 MG/0.4ML SOAJ AUTO INJECT AS NEEDED  . fluticasone (FLONASE) 50 MCG/ACT nasal spray Place 2 sprays into both nostrils daily.  . fluticasone-salmeterol (ADVAIR HFA) 230-21 MCG/ACT inhaler Inhale 1 puff into the lungs 2 (two) times daily.  . hydrOXYzine (ATARAX/VISTARIL) 25 MG tablet Take 50 mg by mouth daily as needed for itching (Take 25 mg by mouth See admin instructions. Takes every Tues 30 mins prior to allergy shot & after as needed for itching. Alternates with Benadryl).  Marland Kitchen ketoconazole (NIZORAL) 2 % shampoo APPLICATIONS APPLY ON THE SKIN WASH HAIR 2-3 TIMES WEEKLY  . levothyroxine (SYNTHROID, LEVOTHROID) 50 MCG tablet Take 50 mcg by mouth 2 (two) times daily. Pt can only take name brand  . mometasone-formoterol (DULERA) 200-5 MCG/ACT AERO INHALE TWO PUFFS TWICE DAILY TO PREVENT COUGH OR WHEEZE. RINSE, GARGLE, AND SPIT AFTER USE. USE WITH SPACER.  . promethazine (PHENERGAN) 25 MG tablet Take 25 mg by mouth every 4 (four) hours as needed.   . topiramate (TOPAMAX) 50 MG tablet Take 50 mg by mouth 2 (two) times daily.   Marland Kitchen UNABLE TO FIND once a week. Allergy injections - once  a week  . Zoledronic Acid (RECLAST IV) Inject into the vein as directed. Yearly --  Last dose Oct 2015  . mirabegron ER (MYRBETRIQ) 50 MG TB24 tablet Take 1 tablet (50 mg total) by mouth daily. (Patient not taking: Reported on 04/21/2015)  . NUCYNTA 100 MG TABS Take 1 tablet by mouth every 8 (eight) hours. Reported on 04/21/2015   No current facility-administered medications on file prior to visit.    ROS  Constitutional: Afebrile, no chills, well hydrated  and well nourished Gastrointestinal: negative Musculoskeletal:negative Neurological: negative Behavioral/Psych: negative  PFSH  Medical:  Ms. Cooperman  has a past medical history of Anxiety; IC (interstitial cystitis); Osteoporosis; Irritable bowel  syndrome (IBS); History of hiatal hernia; Migraines; Hypothyroidism (acquired); History of kidney stones (04/2014); Pain syndrome, chronic; Arthritis; Degenerative disc disease, cervical; Degenerative disc disease, lumbar; Chronic, continuous use of opioids; PONV (postoperative nausea and vomiting); Seasonal allergies; Sciatica; Fibromyalgia; Dental crowns present; Difficulty swallowing pills; Difficult intravenous access; Delayed gastric emptying; Esophageal dysmotility; Hemangioma, renal; Hemangioma of liver; Limited joint range of motion; and Knee pain, right. Family: family history includes Anesthesia problems in her mother; Cancer in her daughter, maternal aunt, and mother; Leukemia in her mother. Surgical:  has past surgical history that includes Total thyroidectomy (1993); cysto with hydrodistension (01/29/2011); Cystoscopy w/ retrogrades (01/29/2011); Fissurectomy (~ 1988 x2); Refractive surgery (Bilateral, ~ 1999; ~ 2013); Nasal sinus surgery (2013); Laparoscopic lysis of adhesions (02/11/2012); Excision haglund's deformity with achilles tendon repair (Bilateral, 2013-2014); Melanoma excision (Left, 2013); Cystoscopy with stent placement (2006); Appendectomy (~ 1992); Tonsillectomy and adenoidectomy (~ 1989); LAPAROSCOPY ADHESIOLYSIS AND REMOVAL POSSIBLE RIGHT OVARY REMNANT (04/18/2000); Knee arthroscopy (Right, 08/16/2000); Carpal tunnel release (Right, 07/26/2001); DEBRIDEMENT RIGHT THUMB MP JOINT (Right, 04/10/2002); RIGHT THUMB FUSION OF MPJ (Right, 10/03/2002); DECOMPRESSION LEFT ULNAR NERVE, ELBOW (Left, 01/29/2003); RIGHT TOTAL KNEE REVISION ARTHROPLASTY (Right, 03/14/2007); CYSTO/ URETHRAL DILATION/ HYDRODISTENTION/ BLADDER BX (04/10/2003); Laparoscopic assisted vaginal hysterectomy (Feb 1999); LAPAROSCOPY LYSIS ADHESIONS/  BILATERAL SALPINGOOPHORECTOMY/  FULGERATION OF ENDOMETRIOSIS (1998); Anterior cervical decomp/discectomy fusion (03/10/2009); Esophagogastroduodenoscopy (egd) with esophageal dilation  (10-26-2000); CYSTO/  BLADDER BX (03/08/2008); transthoracic echocardiogram (09/22/2012); cysto with hydrodistension (N/A, 05/08/2014); laparoscopy (02/11/2012); Transanal hemorrhoidal dearterialization (03/26/2011); Knee arthroscopy with fulkerson slide (Right, 11/29/2000); Knee arthroscopy w/ lateral release (Right, 11/29/2000); Laparoscopic cholecystectomy (03/04/2002); Total knee arthroplasty (Right, 09/10/2003; 12/08/2012); Carpal tunnel release (Left); Melanoma excision; Augmentation mammaplasty (Bilateral, 1996; 2001); Breast surgery (1980's); Carpometacarpel [facility]) suspension plasty (Right, 02/10/2015); Ganglion cyst excision (Right, 02/10/2015); NEGATIVE SLEEP STUDY (2006 approx .  per pt); and cysto with hydrodistension (N/A, 03/27/2015). Tobacco:  reports that she quit smoking about 9 years ago. She has never used smokeless tobacco. Alcohol:  reports that she drinks alcohol. Drug:  reports that she does not use illicit drugs.  Physical Exam  Vitals:  Today's Vitals   04/21/15 1408 04/21/15 1411  BP: 146/86   Pulse: 79   Temp: 98 F (36.7 C)   TempSrc: Oral   Resp: 16   Height: 5' 2.5" (1.588 m)   Weight: 125 lb (56.7 kg)   SpO2: 100%   PainSc:  4     Calculated BMI: Body mass index is 22.48 kg/(m^2).  General appearance: alert, cooperative, appears stated age and no distress Eyes: PERLA Respiratory: No evidence respiratory distress, no audible rales or ronchi and no use of accessory muscles of respiration Lumbar Spine Exam  Inspection: No gross anomalies detected Alignment: Symetrical Palpation: WNL ROM:  Flexion: Adequate and non-contributory Extension: Adequate and non-contributory Lateral Bending: Adequate and non-contributory Rotation: Adequate and non-contributory Provocative Tests:  Lumbar Hyperextension and rotation test:  deferred Patrick's Maneuver: deferred  Gait Evaluation  Gait: Antalgic (limping)  Lower Extremity Exam  Inspection: Multiple scars from her prior  knee surgeries. (Right lower extremity) ROM: Decreased right knee ROM Sensory:  Dysesthesias Motor: Guarding  Toe walk (S1): With difficulty  Heal walk (L5): With difficulty   Assessment & Plan  Primary Diagnosis & Pertinent Problem List: The primary encounter diagnosis was CRPS (complex regional pain syndrome) type  I of lower limb, right. Diagnoses of Chronic knee pain (Location of Secondary source of pain) (Right) and Chronic lower extremity pain (Location of Primary Source of Pain) (Right) were also pertinent to this visit.  Visit Diagnosis: 1. CRPS (complex regional pain syndrome) type I of lower limb, right   2. Chronic knee pain (Location of Secondary source of pain) (Right)   3. Chronic lower extremity pain (Location of Primary Source of Pain) (Right)     Problem-specific Plan(s): No problem-specific assessment & plan notes found for this encounter.   Plan of Care  Pharmacotherapy (Medications Ordered): No orders of the defined types were placed in this encounter.    Lab-work & Procedure Ordered: Orders Placed This Encounter  Procedures  . LUMBAR SYMPATHETIC BLOCK    Standing Status: Future     Number of Occurrences:      Standing Expiration Date: 04/20/2016    Scheduling Instructions:     Side: Right-sided (radiofrequency ablation)     Sedation: With Sedation.     Timeframe: ASAA    Order Specific Question:  Where will this procedure be performed?    Answer:  [facility]  . Radiofrequency,Lumbar    Standing Status: Future     Number of Occurrences:      Standing Expiration Date: 04/20/2016    Scheduling Instructions:     Right lumbar sympathetic radiofrequency ablation under fluoroscopic guidance and IV sedation.    Order Specific Question:  Where will this procedure be performed?    Answer:  [facility]    Imaging Ordered: None  Interventional Therapies: Scheduled: Palliative, right sided, lumbar sympathetic radiofrequency ablation under  fluoroscopic guidance and IV sedation. PRN Procedures: None at this time.    Referral(s) or Consult(s): None at this time.  Medications administered during this visit: Ms. Clint does not currently have medications on file.  Future Appointments Date Time Provider [place]  05/06/2015 11:00 AM Milinda Pointer, MD [facility] None  06/16/2015 4:00 PM Pieter Partridge, DO [facility] None  07/29/2015 11:30 AM Pieter Partridge, DO [facility] None    Primary Care Physician: Thressa Sheller, MD Location: [facility] Note by: Kathlen Brunswick. Dossie Arbour, M.D, DABA, DABAPM, DABPM, DABIPP, FIPP  Pain Score Disclaimer: We use the NRS-11 scale. This is a self-reported, subjective measurement of pain severity with only modest accuracy. It is used primarily to identify changes within a particular patient. It must be understood that outpatient pain scales are significantly less accurate that those used for research, where they can be applied under ideal controlled circumstances with minimal exposure to variables. In reality, the score is likely to be a combination of pain intensity and pain affect, where pain affect describes the degree of emotional arousal or changes in action readiness caused by the sensory experience of pain. Factors such as social and work situation, setting, emotional state, anxiety levels, expectation, and prior pain experience may influence pain perception and show large inter-individual differences that may also be affected by time variables.

## 2015-04-25 DIAGNOSIS — D485 Neoplasm of uncertain behavior of skin: Secondary | ICD-10-CM | POA: Diagnosis not present

## 2015-04-25 DIAGNOSIS — L281 Prurigo nodularis: Secondary | ICD-10-CM | POA: Diagnosis not present

## 2015-04-28 DIAGNOSIS — M67431 Ganglion, right wrist: Secondary | ICD-10-CM | POA: Diagnosis not present

## 2015-04-28 DIAGNOSIS — M1811 Unilateral primary osteoarthritis of first carpometacarpal joint, right hand: Secondary | ICD-10-CM | POA: Diagnosis not present

## 2015-05-06 ENCOUNTER — Encounter: Payer: Self-pay | Admitting: Pain Medicine

## 2015-05-06 ENCOUNTER — Ambulatory Visit: Payer: BLUE CROSS/BLUE SHIELD | Attending: Pain Medicine | Admitting: Pain Medicine

## 2015-05-06 VITALS — BP 127/76 | HR 74 | Temp 96.1°F | Resp 16 | Ht 63.0 in | Wt 126.0 lb

## 2015-05-06 DIAGNOSIS — M797 Fibromyalgia: Secondary | ICD-10-CM | POA: Diagnosis not present

## 2015-05-06 DIAGNOSIS — M545 Low back pain: Secondary | ICD-10-CM | POA: Insufficient documentation

## 2015-05-06 DIAGNOSIS — M25512 Pain in left shoulder: Secondary | ICD-10-CM | POA: Diagnosis not present

## 2015-05-06 DIAGNOSIS — M79604 Pain in right leg: Secondary | ICD-10-CM | POA: Insufficient documentation

## 2015-05-06 DIAGNOSIS — K581 Irritable bowel syndrome with constipation: Secondary | ICD-10-CM | POA: Insufficient documentation

## 2015-05-06 DIAGNOSIS — R413 Other amnesia: Secondary | ICD-10-CM | POA: Insufficient documentation

## 2015-05-06 DIAGNOSIS — M810 Age-related osteoporosis without current pathological fracture: Secondary | ICD-10-CM | POA: Diagnosis not present

## 2015-05-06 DIAGNOSIS — J989 Respiratory disorder, unspecified: Secondary | ICD-10-CM | POA: Diagnosis not present

## 2015-05-06 DIAGNOSIS — J441 Chronic obstructive pulmonary disease with (acute) exacerbation: Secondary | ICD-10-CM | POA: Diagnosis not present

## 2015-05-06 DIAGNOSIS — H918X9 Other specified hearing loss, unspecified ear: Secondary | ICD-10-CM | POA: Diagnosis not present

## 2015-05-06 DIAGNOSIS — R252 Cramp and spasm: Secondary | ICD-10-CM | POA: Insufficient documentation

## 2015-05-06 DIAGNOSIS — G8918 Other acute postprocedural pain: Secondary | ICD-10-CM

## 2015-05-06 DIAGNOSIS — G90521 Complex regional pain syndrome I of right lower limb: Secondary | ICD-10-CM | POA: Diagnosis not present

## 2015-05-06 DIAGNOSIS — E039 Hypothyroidism, unspecified: Secondary | ICD-10-CM | POA: Diagnosis not present

## 2015-05-06 DIAGNOSIS — G43909 Migraine, unspecified, not intractable, without status migrainosus: Secondary | ICD-10-CM | POA: Insufficient documentation

## 2015-05-06 DIAGNOSIS — M25561 Pain in right knee: Secondary | ICD-10-CM | POA: Diagnosis not present

## 2015-05-06 DIAGNOSIS — Z79891 Long term (current) use of opiate analgesic: Secondary | ICD-10-CM | POA: Insufficient documentation

## 2015-05-06 DIAGNOSIS — F411 Generalized anxiety disorder: Secondary | ICD-10-CM | POA: Diagnosis not present

## 2015-05-06 DIAGNOSIS — G8929 Other chronic pain: Secondary | ICD-10-CM | POA: Diagnosis not present

## 2015-05-06 DIAGNOSIS — H6060 Unspecified chronic otitis externa, unspecified ear: Secondary | ICD-10-CM | POA: Insufficient documentation

## 2015-05-06 DIAGNOSIS — R1013 Epigastric pain: Secondary | ICD-10-CM | POA: Diagnosis not present

## 2015-05-06 DIAGNOSIS — J3489 Other specified disorders of nose and nasal sinuses: Secondary | ICD-10-CM | POA: Diagnosis not present

## 2015-05-06 DIAGNOSIS — M26609 Unspecified temporomandibular joint disorder, unspecified side: Secondary | ICD-10-CM | POA: Insufficient documentation

## 2015-05-06 DIAGNOSIS — H9209 Otalgia, unspecified ear: Secondary | ICD-10-CM | POA: Diagnosis not present

## 2015-05-06 DIAGNOSIS — M79606 Pain in leg, unspecified: Secondary | ICD-10-CM | POA: Diagnosis present

## 2015-05-06 MED ORDER — LACTATED RINGERS IV SOLN: 1000.0000 mL | INTRAVENOUS | Status: AC

## 2015-05-06 MED ORDER — SODIUM CHLORIDE 0.9 % IJ SOLN
INTRAMUSCULAR | Status: AC
  Administered 2015-05-06: 10 mL
  Filled 2015-05-06: qty 10

## 2015-05-06 MED ORDER — ROPIVACAINE HCL 2 MG/ML IJ SOLN
INTRAMUSCULAR | Status: AC
  Filled 2015-05-06: qty 10

## 2015-05-06 MED ORDER — PROMETHAZINE HCL 25 MG/ML IJ SOLN
INTRAMUSCULAR | Status: AC
  Administered 2015-05-06: 12.5 mg via INTRAVENOUS
  Filled 2015-05-06: qty 1

## 2015-05-06 MED ORDER — LIDOCAINE HCL (PF) 1 % IJ SOLN
10.0000 mL | Freq: Once | INTRAMUSCULAR | Status: AC
  Administered 2015-05-06: 10 mg

## 2015-05-06 MED ORDER — CEFAZOLIN SODIUM 1 G IJ SOLR
INTRAMUSCULAR | Status: AC
  Administered 2015-05-06: 1 g via INTRAVENOUS
  Filled 2015-05-06: qty 10

## 2015-05-06 MED ORDER — LIDOCAINE HCL (PF) 1 % IJ SOLN
INTRAMUSCULAR | Status: AC
  Administered 2015-05-06: 10 mg
  Filled 2015-05-06: qty 10

## 2015-05-06 MED ORDER — ROPIVACAINE HCL 2 MG/ML IJ SOLN
9.0000 mL | Freq: Once | INTRAMUSCULAR | Status: DC
Start: 2015-05-06 — End: 2016-02-25

## 2015-05-06 MED ORDER — FENTANYL CITRATE (PF) 100 MCG/2ML IJ SOLN
100.0000 ug | INTRAMUSCULAR | Status: AC
  Administered 2015-05-06: 100 ug via INTRAVENOUS

## 2015-05-06 MED ORDER — DIPHENHYDRAMINE HCL 50 MG/ML IJ SOLN
INTRAMUSCULAR | Status: AC
  Administered 2015-05-06: 50 mg via INTRAVENOUS
  Filled 2015-05-06: qty 1

## 2015-05-06 MED ORDER — MAGNESIUM OXIDE -MG SUPPLEMENT 500 MG PO CAPS: 1.0000 | ORAL_CAPSULE | Freq: Every day | ORAL | Status: DC

## 2015-05-06 MED ORDER — MIDAZOLAM HCL 5 MG/5ML IJ SOLN
5.0000 mg | INTRAMUSCULAR | Status: AC
  Administered 2015-05-06: 5 mg via INTRAVENOUS

## 2015-05-06 MED ORDER — OXYCODONE HCL 5 MG PO TABS: 5.0000 mg | ORAL_TABLET | Freq: Four times a day (QID) | ORAL | Status: DC | PRN

## 2015-05-06 MED ORDER — BUPIVACAINE-EPINEPHRINE (PF) 0.25% -1:200000 IJ SOLN
INTRAMUSCULAR | Status: AC
  Administered 2015-05-06: 6 mg
  Filled 2015-05-06: qty 30

## 2015-05-06 MED ORDER — MIDAZOLAM HCL 5 MG/5ML IJ SOLN
INTRAMUSCULAR | Status: AC
  Administered 2015-05-06: 5 mg via INTRAVENOUS
  Filled 2015-05-06: qty 5

## 2015-05-06 MED ORDER — BUPIVACAINE-EPINEPHRINE (PF) 0.25% -1:200000 IJ SOLN
10.0000 mL | Freq: Once | INTRAMUSCULAR | Status: AC
  Administered 2015-05-06: 6 mg

## 2015-05-06 MED ORDER — PROMETHAZINE HCL 25 MG/ML IJ SOLN
INTRAMUSCULAR | Status: AC
  Filled 2015-05-06: qty 1

## 2015-05-06 MED ORDER — FENTANYL CITRATE (PF) 100 MCG/2ML IJ SOLN
INTRAMUSCULAR | Status: AC
  Administered 2015-05-06: 100 ug via INTRAVENOUS
  Filled 2015-05-06: qty 2

## 2015-05-06 MED ORDER — DIPHENHYDRAMINE HCL 50 MG/ML IJ SOLN
50.0000 mg | INTRAMUSCULAR | Status: AC
  Administered 2015-05-06: 50 mg via INTRAVENOUS

## 2015-05-06 NOTE — Progress Notes (Signed)
Safety precautions to be maintained throughout the outpatient stay will include: orient to surroundings, keep bed in low position, maintain call bell within reach at all times, provide assistance with transfer out of bed and ambulation.  

## 2015-05-06 NOTE — Patient Instructions (Signed)
Pain Management Discharge Instructions  General Discharge Instructions :  If you need to reach your doctor call: Monday-Friday 8:00 am - 4:00 pm at [phone] or toll free [phone].  After clinic hours [phone] to have operator reach doctor.  Bring all of your medication bottles to all your appointments in the pain clinic.  To cancel or reschedule your appointment with Pain Management please remember to call 24 hours in advance to avoid a fee.  Refer to the educational materials which you have been given on: General Risks, I had my Procedure. Discharge Instructions, Post Sedation.  Post Procedure Instructions:  The drugs you were given will stay in your system until tomorrow, so for the next 24 hours you should not drive, make any legal decisions or drink any alcoholic beverages.  You may eat anything you prefer, but it is better to start with liquids then soups and crackers, and gradually work up to solid foods.  Please notify your doctor immediately if you have any unusual bleeding, trouble breathing or pain that is not related to your normal pain.  Depending on the type of procedure that was done, some parts of your body may feel week and/or numb.  This usually clears up by tonight or the next day.  Walk with the use of an assistive device or accompanied by an adult for the 24 hours.  You may use ice on the affected area for the first 24 hours.  Put ice in a Ziploc bag and cover with a towel and place against area 15 minutes on 15 minutes off.  You may switch to heat after 24 hours.Radiofrequency Lesioning, Care After Refer to this sheet in the next few weeks. These instructions provide you with information about caring for yourself after your procedure. Your health care provider may also give you more specific instructions. Your treatment has been planned according to current medical practices, but problems sometimes occur. Call your health care provider if you have any  problems or questions after your procedure. WHAT TO EXPECT AFTER THE PROCEDURE After your procedure, it is common to have:  Pain from the burned nerve.  Temporary numbness. HOME CARE INSTRUCTIONS  Take over-the-counter and prescription medicines only as told by your health care provider.  Return to your normal activities as told by your health care provider. Ask your health care provider what activities are safe for you.  Pay close attention to how you feel after the procedure. If you start to have pain, write down when it hurts and how it feels. This will help you and your health care provider to know if you need an additional treatment.  Check your needle insertion site every day for signs of infection. Watch for:  Redness, swelling, or pain.  Fluid, blood, or pus.  Keep all follow-up visits as told by your health care provider. This is important. SEEK MEDICAL CARE IF:  Your pain does not get better.  You have redness, swelling, or pain at the needle insertion site.  You have fluid, blood, or pus coming from the needle insertion site.  You have a fever. SEEK IMMEDIATE MEDICAL CARE IF:  You develop sudden, severe pain.  You develop numbness or tingling near the procedure site that does not go away.   This information is not intended to replace advice given to you by your health care provider. Make sure you discuss any questions you have with your health care provider.   Document Released: 10/07/2010 Document Revised: 10/30/2014 Document Reviewed: 03/18/2014  Elsevier Interactive Patient Education 2016 [place]. Radiofrequency Lesioning Radiofrequency lesioning is a procedure that is performed to relieve pain. The procedure is often used for back, neck, or arm pain. Radiofrequency lesioning involves the use of a machine that creates radio waves to make heat. During the procedure, the heat is applied to the nerve that carries the pain signal. The heat damages the nerve  and interferes with the pain signal. Pain relief usually lasts for 6 months to 1 year. LET [facility] CARE PROVIDER KNOW ABOUT:  Any allergies you have.  All medicines you are taking, including vitamins, herbs, eye drops, creams, and over-the-counter medicines.  Previous problems you or members of your family have had with the use of anesthetics.  Any blood disorders you have.  Previous surgeries you have had.  Any medical conditions you have.  Whether you are pregnant or may be pregnant. RISKS AND COMPLICATIONS Generally, this is a safe procedure. However, problems may occur, including:  Pain or soreness at the injection site.  Infection at the injection site.  Damage to nerves or blood vessels. BEFORE THE PROCEDURE  Ask your health care provider about:  Changing or stopping your regular medicines. This is especially important if you are taking diabetes medicines or blood thinners.  Taking medicines such as aspirin and ibuprofen. These medicines can thin your blood. Do not take these medicines before your procedure if your health care provider instructs you not to.  Follow instructions from your health care provider about eating or drinking restrictions.  Plan to have someone take you home after the procedure.  If you go home right after the procedure, plan to have someone with you for 24 hours. PROCEDURE  You will be given one or more of the following:  A medicine to help you relax (sedative).  A medicine to numb the area (local anesthetic).  You will be awake during the procedure. You will need to be able to talk with the health care provider during the procedure.  With the help of a type of X-ray (fluoroscopy), the health care provider will insert a radiofrequency needle into the area to be treated.  Next, a wire that carries the radio waves (electrode) will be put through the radiofrequency needle. An electrical pulse will be sent through the electrode to verify  the correct nerve. You will feel a tingling sensation, and you may have muscle twitching.  Then, the tissue that is around the needle tip will be heated by an electric current that is passed using the radiofrequency machine. This will numb the nerves.  A bandage (dressing) will be put on the insertion area after the procedure is done. The procedure may vary among health care providers and hospitals. AFTER THE PROCEDURE  Your blood pressure, heart rate, breathing rate, and blood oxygen level will be monitored often until the medicines you were given have worn off.  Return to your normal activities as directed by your health care provider.   This information is not intended to replace advice given to you by your health care provider. Make sure you discuss any questions you have with your health care provider.   Document Released: 10/07/2010 Document Revised: 10/30/2014 Document Reviewed: 03/18/2014 Elsevier Interactive Patient Education 2016 [place]  What are the risk, side effects and possible complications? Generally speaking, most procedures are safe.  However, with any procedure there are risks, side effects, and the possibility of complications.  The risks and complications are dependent  upon the sites that are lesioned, or the type of nerve block to be performed.  The closer the procedure is to the spine, the more serious the risks are.  Great care is taken when placing the radio frequency needles, block needles or lesioning probes, but sometimes complications can occur.  Infection: Any time there is an injection through the skin, there is a risk of infection.  This is why sterile conditions are used for these blocks.  There are four possible types of infection.  Localized skin infection.  Central Nervous System Infection-This can be in the form of Meningitis, which can be deadly.  Epidural Infections-This can be in the form of an epidural  abscess, which can cause pressure inside of the spine, causing compression of the spinal cord with subsequent paralysis. This would require an emergency surgery to decompress, and there are no guarantees that the patient would recover from the paralysis.  Discitis-This is an infection of the intervertebral discs.  It occurs in about 1% of discography procedures.  It is difficult to treat and it may lead to surgery.        2. Pain: the needles have to go through skin and soft tissues, will cause soreness.       3. Damage to internal structures:  The nerves to be lesioned may be near blood vessels or    other nerves which can be potentially damaged.       4. Bleeding: Bleeding is more common if the patient is taking blood thinners such as  aspirin, Coumadin, Ticiid, Plavix, etc., or if he/she have some genetic predisposition  such as hemophilia. Bleeding into the spinal canal can cause compression of the spinal  cord with subsequent paralysis.  This would require an emergency surgery to  decompress and there are no guarantees that the patient would recover from the  paralysis.       5. Pneumothorax:  Puncturing of a lung is a possibility, every time a needle is introduced in  the area of the chest or upper back.  Pneumothorax refers to free air around the  collapsed lung(s), inside of the thoracic cavity (chest cavity).  Another two possible  complications related to a similar event would include: Hemothorax and Chylothorax.   These are variations of the Pneumothorax, where instead of air around the collapsed  lung(s), you may have blood or chyle, respectively.       6. Spinal headaches: They may occur with any procedures in the area of the spine.       7. Persistent CSF (Cerebro-Spinal Fluid) leakage: This is a rare problem, but may occur  with prolonged intrathecal or epidural catheters either due to the formation of a fistulous  track or a dural tear.       8. Nerve damage: By working so close to the  spinal cord, there is always a possibility of  nerve damage, which could be as serious as a permanent spinal cord injury with  paralysis.       9. Death:  Although rare, severe deadly allergic reactions known as "Anaphylactic  reaction" can occur to any of the medications used.      10. Worsening of the symptoms:  We can always make thing worse.  What are the chances of something like this happening? Chances of any of this occuring are extremely low.  By statistics, you have more of a chance of getting killed in a motor vehicle accident: while driving to the hospital  than any of the above occurring .  Nevertheless, you should be aware that they are possibilities.  In general, it is similar to taking a shower.  Everybody knows that you can slip, hit your head and get killed.  Does that mean that you should not shower again?  Nevertheless always keep in mind that statistics do not mean anything if you happen to be on the wrong side of them.  Even if a procedure has a 1 (one) in a 1,000,000 (million) chance of going wrong, it you happen to be that one..Also, keep in mind that by statistics, you have more of a chance of having something go wrong when taking medications.  Who should not have this procedure? If you are on a blood thinning medication (e.g. Coumadin, Plavix, see list of "Blood Thinners"), or if you have an active infection going on, you should not have the procedure.  If you are taking any blood thinners, please inform your physician.  How should I prepare for this procedure?  Do not eat or drink anything at least six hours prior to the procedure.  Bring a driver with you .  It cannot be a taxi.  Come accompanied by an adult that can drive you back, and that is strong enough to help you if your legs get weak or numb from the local anesthetic.  Take all of your medicines the morning of the procedure with just enough water to swallow them.  If you have diabetes, make sure that you are  scheduled to have your procedure done first thing in the morning, whenever possible.  If you have diabetes, take only half of your insulin dose and notify our nurse that you have done so as soon as you arrive at the clinic.  If you are diabetic, but only take blood sugar pills (oral hypoglycemic), then do not take them on the morning of your procedure.  You may take them after you have had the procedure.  Do not take aspirin or any aspirin-containing medications, at least eleven (11) days prior to the procedure.  They may prolong bleeding.  Wear loose fitting clothing that may be easy to take off and that you would not mind if it got stained with Betadine or blood.  Do not wear any jewelry or perfume  Remove any nail coloring.  It will interfere with some of our monitoring equipment.  NOTE: Remember that this is not meant to be interpreted as a complete list of all possible complications.  Unforeseen problems may occur.  BLOOD THINNERS The following drugs contain aspirin or other products, which can cause increased bleeding during surgery and should not be taken for 2 weeks prior to and 1 week after surgery.  If you should need take something for relief of minor pain, you may take acetaminophen which is found in Tylenol,m Datril, Anacin-3 and Panadol. It is not blood thinner. The products listed below are.  Do not take any of the products listed below in addition to any listed on your instruction sheet.  A.P.C or A.P.C with Codeine Codeine Phosphate Capsules #3 Ibuprofen Ridaura  ABC compound Congesprin Imuran rimadil  Advil Cope Indocin Robaxisal  Alka-Seltzer Effervescent Pain Reliever and Antacid Coricidin or Coricidin-D  Indomethacin Rufen  Alka-Seltzer plus Cold Medicine Cosprin Ketoprofen S-A-C Tablets  Anacin Analgesic Tablets or Capsules Coumadin Korlgesic Salflex  Anacin Extra Strength Analgesic tablets or capsules CP-2 Tablets Lanoril Salicylate  Anaprox Cuprimine Capsules  Levenox Salocol  Anexsia-D Dalteparin Magan Salsalate  Anodynos Darvon compound Magnesium Salicylate Sine-off  Ansaid Dasin Capsules Magsal Sodium Salicylate  Anturane Depen Capsules Marnal Soma  APF Arthritis pain formula Dewitt's Pills Measurin Stanback  Argesic Dia-Gesic Meclofenamic Sulfinpyrazone  Arthritis Bayer Timed Release Aspirin Diclofenac Meclomen Sulindac  Arthritis pain formula Anacin Dicumarol Medipren Supac  Analgesic (Safety coated) Arthralgen Diffunasal Mefanamic Suprofen  Arthritis Strength Bufferin Dihydrocodeine Mepro Compound Suprol  Arthropan liquid Dopirydamole Methcarbomol with Aspirin Synalgos  ASA tablets/Enseals Disalcid Micrainin Tagament  Ascriptin Doan's Midol Talwin  Ascriptin A/D Dolene Mobidin Tanderil  Ascriptin Extra Strength Dolobid Moblgesic Ticlid  Ascriptin with Codeine Doloprin or Doloprin with Codeine Momentum Tolectin  Asperbuf Duoprin Mono-gesic Trendar  Aspergum Duradyne Motrin or Motrin IB Triminicin  Aspirin plain, buffered or enteric coated Durasal Myochrisine Trigesic  Aspirin Suppositories Easprin Nalfon Trillsate  Aspirin with Codeine Ecotrin Regular or Extra Strength Naprosyn Uracel  Atromid-S Efficin Naproxen Ursinus  Auranofin Capsules Elmiron Neocylate Vanquish  Axotal Emagrin Norgesic Verin  Azathioprine Empirin or Empirin with Codeine Normiflo Vitamin E  Azolid Emprazil Nuprin Voltaren  Bayer Aspirin plain, buffered or children's or timed BC Tablets or powders Encaprin Orgaran Warfarin Sodium  Buff-a-Comp Enoxaparin Orudis Zorpin  Buff-a-Comp with Codeine Equegesic Os-Cal-Gesic   Buffaprin Excedrin plain, buffered or Extra Strength Oxalid   Bufferin Arthritis Strength Feldene Oxphenbutazone   Bufferin plain or Extra Strength Feldene Capsules Oxycodone with Aspirin   Bufferin with Codeine Fenoprofen Fenoprofen Pabalate or Pabalate-SF   Buffets II Flogesic Panagesic   Buffinol plain or Extra Strength Florinal or Florinal with  Codeine Panwarfarin   Buf-Tabs Flurbiprofen Penicillamine   Butalbital Compound Four-way cold tablets Penicillin   Butazolidin Fragmin Pepto-Bismol   Carbenicillin Geminisyn Percodan   Carna Arthritis Reliever Geopen Persantine   Carprofen Gold's salt Persistin   Chloramphenicol Goody's Phenylbutazone   Chloromycetin Haltrain Piroxlcam   Clmetidine heparin Plaquenil   Cllnoril Hyco-pap Ponstel   Clofibrate Hydroxy chloroquine Propoxyphen         Before stopping any of these medications, be sure to consult the physician who ordered them.  Some, such as Coumadin (Warfarin) are ordered to prevent or treat serious conditions such as "deep thrombosis", "pumonary embolisms", and other heart problems.  The amount of time that you may need off of the medication may also vary with the medication and the reason for which you were taking it.  If you are taking any of these medications, please make sure you notify your pain physician before you undergo any procedures.

## 2015-05-06 NOTE — Progress Notes (Signed)
Patient's Name: Kari Green MRN: [id number] DOB: Jun 16, 1965 DOS: 05/06/2015  Primary Reason(s) for Visit: Interventional Pain Management Treatment. CC: Leg Pain   Pre-Procedure Assessment:  Kari Green is a 50 y.o. year old, female patient, seen today for interventional treatment. She has Irritable bowel syndrome with constipation; Abdominal pain, epigastric; Abdominal adhesions; COPD (chronic obstructive pulmonary disease) ([place]); Hypothyroidism; Left shoulder pain; Memory deficits; Asthma; Allergic rhinitis; Chronic low back pain; CRPS (complex regional pain syndrome) type I of lower limb (Right-sided); History of arthroplasty of right knee; Chronic pain syndrome; Chronic pain; Chronic knee pain (Location of Secondary source of pain) (Right); Generalized anxiety disorder; Chronic lower extremity pain (Location of Primary Source of Pain) (Right); Multiple drug allergies (codeine, Lidoderm a DC if, sulfa, latex, IVP dye, Keflex, Augmentin); Latex allergy; Long term current use of opiate analgesic; Long term prescription opiate use; Opiate use; Opiate dependence ([place]); History of allergy to IVP dye; Asthma with acute exacerbation; Temporomandibular joint disorder; Buzzing in ear; Ear pain, referred; OP (osteoporosis); Nasal septal perforation; History of migraine headaches; Difficulty hearing; H/O respiratory system disease; Fibromyalgia; Deflected nasal septum; Chronic otitis externa; Abnormal auditory perception; Muscle cramps at night; and Acute Pain following procedure (Lumbar Sympathetic Radiofrequency Ablation) on her problem list.. Her primarily concern today is the Leg Pain   Today's Initial Pain Score: 6/10 Reported level of pain is compatible with clinical observation Pain Type: Chronic pain Pain Location: Leg Pain Orientation: Right Pain Descriptors / Indicators: Aching, Constant, Dull, Throbbing Pain Frequency: Constant  Post-procedure Pain Score: 2   Date of Last Visit:  04/21/15 Service Provided on Last Visit: Evaluation (post procedure eval)  Verification of the correct person, correct site (including marking of site), and correct procedure were performed and confirmed by the patient.  Today's Vitals   05/06/15 1240 05/06/15 1250 05/06/15 1300 05/06/15 1310  BP: 121/74 142/66 128/74 127/76  Pulse: 106 89 74 74  Temp:  97.4 F (36.3 C)  96.1 F (35.6 C)  Resp: _0 Height:      Weight:      SpO2: 98% 100% 100% 100%  PainSc: 2      PainLoc:      Calculated BMI: Body mass index is 22.33 kg/(m^2). Allergies: She is allergic to iodinated diagnostic agents; red dye; chloraprep one step; cobalt; corticosteroids; eggs or egg-derived products; latex; morphine and related; nickel; other; peanuts; supartz; surgical lubricant; tape; yellow dyes (non-tartrazine); azithromycin; flexeril; and soap.. Primary Diagnosis: Chronic pain of right lower extremity [M79.604, G89.29]  Procedure:  Type: Palliative Lumbar Sympathetic Radiofrequency Ablation Region:Thoracolumbar Level: L3, L4 Laterality: Right-Sided Paravertebral  Indications: 1. Chronic lower extremity pain (Location of Primary Source of Pain) (Right)   2. CRPS (complex regional pain syndrome) type I of lower limb, right   3. Muscle cramps at night   4. Acute Pain following procedure (Lumbar Sympathetic Radiofrequency Ablation)     In addition, Kari Green has Abdominal pain, epigastric; Abdominal adhesions; Left shoulder pain; Chronic low back pain; CRPS (complex regional pain syndrome) type I of lower limb (Right-sided); History of arthroplasty of right knee; Chronic pain syndrome; Chronic pain; Chronic knee pain (Location of Secondary source of pain) (Right); Chronic lower extremity pain (Location of Primary Source of Pain) (Right); Muscle cramps at night; and Acute Pain following procedure (Lumbar Sympathetic Radiofrequency Ablation) on her pertinent problem list.  Consent: Secured. Under  the influence of no sedatives a written informed consent was obtained, after having provided information on the risks  and possible complications. To fulfill our ethical and legal obligations, as recommended by the American Medical Association's Code of Ethics, we have provided information to the patient about our clinical impression; the nature and purpose of the treatment or procedure; the risks, benefits, and possible complications of the intervention; alternatives; the risk(s) and benefit(s) of the alternative treatment(s) or procedure(s); and the risk(s) and benefit(s) of doing nothing. The patient was provided information about the risks and possible complications associated with the procedure. These include, but are not limited to, failure to achieve desired goals, infection, bleeding, organ or nerve damage, allergic reactions, paralysis, and death. In the case of spinal procedures these may include, but are not limited to, failure to achieve desired goals, infection, bleeding, organ or nerve damage, allergic reactions, paralysis, and death. In addition, the patient was informed that Medicine is not an exact science; therefore, there is also the possibility of unforeseen risks and possible complications that may result in a catastrophic outcome. The patient indicated having understood very clearly. We have given the patient no guarantees and we have made no promises. Enough time was given to the patient to ask questions, all of which were answered to the patient's satisfaction.  Pre-Procedure Preparation: Safety Precautions: Allergies reviewed. Appropriate site, procedure, and patient were confirmed by following the Joint Commission's Universal Protocol (UP.01.01.01), in the form of a "Time Out". The patient was asked to confirm marked site and procedure, before commencing. The patient was asked about blood thinners, or active infections, both of which were denied. Patient was assessed for positional  comfort and all pressure points were checked before starting procedure. Monitoring:  As per clinic protocol. Infection Control Precautions: Sterile technique used. Standard Universal Precautions were taken as recommended by the Department of [facility] for Disease Control and Prevention (CDC). Standard pre-surgical skin prep was conducted. Respiratory hygiene and cough etiquette was practiced. Hand hygiene observed. Safe injection practices and needle disposal techniques followed. SDV (single dose vial) medications used. Medications properly checked for expiration dates and contaminants. Personal protective equipment (PPE) used: Sterile Radiation-resistant gloves.  Anesthesia, Analgesia, Anxiolysis:  Type: Moderate (Conscious) Sedation & Local Anesthesia Local Anesthetic: Lidocaine 1% Route: Intravenous (IV) IV Access: Secured Sedation: Meaningful verbal contact was maintained at all times during the procedure  Indication(s): Analgesia & Anxiolysis  Description of Procedure Process:  Time-out: "Time-out" completed before starting procedure, as per protocol. Position: Prone with head of the table was raised to facilitate breathing. Target Area: For Lumbar Sympathetic Block(s), the target is the anterolateral aspect of the L3 & L4 vertebral bodies, where the lumbar sympathetic chain resides. Approach: Paravertebral, ipsilateral approach. Area Prepped: Entire Posterior Thoracolumbar Region Prepping solution: ChloraPrep (2% chlorhexidine gluconate and 70% isopropyl alcohol) Safety Precautions: Aspiration looking for blood return was conducted prior to all injections. At no point did we inject any substances, as a needle was being advanced. No attempts were made at seeking any paresthesias. Safe injection practices and needle disposal techniques used. Medications properly checked for expiration dates. SDV (single dose vial) medications used. Latex Allergy precautions taken. Contrast Allergy  precautions taken.   Description of the Procedure: Protocol guidelines were followed. The patient was placed in position over the procedure table. The target area was identified and the area prepped in the usual manner. Skin & deeper tissues infiltrated with local anesthetic. Appropriate amount of time allowed to pass for local anesthetics to take effect. The Radiofrequency needles were introduced ipsilateral to the affected side, aiming at the anterolateral  aspect of the L3 & L4 vertebral bodies, where the Lumbar Sympathetic Chain is located. Using the Radiofrequency Generator, sensory stimulation using 50 Hz was used to locate & identify the nerve, making sure that the needle was positioned such that there was no sensory stimulation to the groin area at any output, or sensory stimulation below 0.3 V or above 0.7 V for the lower extremity. Stimulation using 2 Hz was used to evaluate the motor component. Care was taken not to lesion any nerves that demonstrated motor stimulation of the lower extremities. Once satisfactory placement of the needles was achieved, the above solution was slowly injected after negative aspiration. After waiting for at least 2 minutes, the ablation was performed at 80 degrees C for 60 seconds. The needles were then removed and the area cleansed, making sure to leave some of the prepping solution back to take advantage of its long term bactericidal properties. EBL: None Materials & Medications Used:  Needle(s) Used: 22g - 10cm, Teflon-coated, Radiofrequency needle(s) Solution Injected: 0.25% PF-Bupivacaine w/ Epinephine 1:200,000 (10 mL) Medications Administered today: We administered diphenhydrAMINE, lidocaine (PF), lidocaine (PF), sodium chloride, promethazine, fentaNYL, diphenhydrAMINE, midazolam, sodium chloride, ceFAZolin, bupivacaine-epinephrine, fentaNYL, midazolam, and bupivacaine-epinephrine.Please see chart orders for dosing details.  Imaging Guidance:  Type of Imaging  Technique: Fluoroscopy Guidance (Spinal) Indication(s): Assistance in needle guidance and placement for procedures requiring needle placement in or near specific anatomical locations not easily accessible without such assistance. Exposure Time: Please see nurses notes. Contrast: None used. Patient allergic to contrast dye. None used. Fluoroscopic Guidance: I was personally present in the fluoroscopy suite, where the patient was placed in position for the procedure, over the fluoroscopy-compatible table. Fluoroscopy was manipulated, using "Tunnel Vision Technique", to obtain the best possible view of the target area, on the affected side. Parallax error was corrected before commencing the procedure. A "direction-depth-direction" technique was used to introduce the needle under continuous pulsed fluoroscopic guidance. Once the target was reached, antero-posterior, oblique, and lateral fluoroscopic projection views were taken to confirm needle placement in all planes. Permanently recorded images stored by scanning into EMR. Interpretation: No contrast injected. Intraoperative imaging interpretation by performing Physician. Adequate needle placement confirmed. Needle placement confirmed in AP, lateral, & Oblique Views. Permanent hardcopy images in multiple planes scanned into the patient's record.  Antibiotic Prophylaxis:  Indication(s): No indications identified. Type:  Antibiotics Given (last 72 hours)    Date/Time Action Medication Dose   05/06/15 1151 Given   ceFAZolin (ANCEF) 1 g injection 1 g       Post-operative Assessment:  Complications: No immediate post-treatment complications were observed. Disposition: The patient was discharged home, once institutional criteria were met. Return to clinic in 6 weeks for follow-up evaluation and interpretation of results. The patient tolerated the entire procedure well. A repeat set of vitals were taken after the procedure and the patient was kept under  observation following institutional policy, for this type of procedure. Post-procedural neurological assessment was performed, showing return to baseline, prior to discharge. The patient was provided with post-procedure discharge instructions, including a section on how to identify potential problems. Should any problems arise concerning this procedure, the patient was given instructions to immediately contact us, at any time, without hesitation. In any case, we plan to contact the patient by telephone for a follow-up status report regarding this interventional procedure. Comments:  No additional relevant information.  Medications administered during this visit: We administered diphenhydrAMINE, lidocaine (PF), lidocaine (PF), sodium chloride, promethazine, fentaNYL, diphenhydrAMINE, midazolam, sodium chloride, ceFAZolin,  bupivacaine-epinephrine, fentaNYL, midazolam, and bupivacaine-epinephrine.  Future Appointments Date Time Provider [place]  05/08/2015 11:15 AM Minus Breeding, MD [facility]  06/16/2015 4:00 PM Pieter Partridge, DO [facility] None  06/17/2015 1:00 PM Milinda Pointer, MD [facility] None  07/29/2015 11:30 AM Pieter Partridge, DO [facility] None    Primary Care Physician: Thressa Sheller, MD Location: [facility] Note by: Kathlen Brunswick. Dossie Arbour, M.D, DABA, DABAPM, DABPM, DABIPP, FIPP  Disclaimer:  Medicine is not an exact science. The only guarantee in medicine is that nothing is guaranteed. It is important to note that the decision to proceed with this intervention was based on the information collected from the patient. The Data and conclusions were drawn from the patient's questionnaire, the interview, and the physical examination. Because the information was provided in large part by the patient, it cannot be guaranteed that it has not been purposely or unconsciously manipulated. Every effort has been made to obtain as much relevant data as possible  for this evaluation. It is important to note that the conclusions that lead to this procedure are derived in large part from the available data. Always take into account that the treatment will also be dependent on availability of resources and existing treatment guidelines, considered by other Pain Management Practitioners as being common knowledge and practice, at the time of the intervention. For Medico-Legal purposes, it is also important to point out that variation in procedural techniques and pharmacological choices are the acceptable norm. The indications, contraindications, technique, and results of the above procedure should only be interpreted and judged by a Board-Certified Interventional Pain Specialist with extensive familiarity and expertise in the same exact procedure and technique. Attempts at providing opinions without similar or greater experience and expertise than that of the treating physician will be considered as inappropriate and unethical, and shall result in a formal complaint to the state medical board and applicable specialty societies.

## 2015-05-07 ENCOUNTER — Telehealth: Payer: Self-pay

## 2015-05-07 NOTE — Telephone Encounter (Signed)
Denies complications post procedure. 

## 2015-05-07 NOTE — Progress Notes (Signed)
Cardiology Office Note   Date:  05/08/2015   ID:  Kari Green, DOB [birth year], MRN [id number]  PCP:  Thressa Sheller, MD  Cardiologist:   Minus Breeding, MD   Chief Complaint  Patient presents with  . Chest Pain      History of Present Illness: Kari Green is a 50 y.o. female who presents for evaluation of chest pain and recent hypertension.  She has not had hypertension before but does have a blood pressure cuff and recently noticed her pressures were going up as high as 170/120. She was experiencing some discomfort in her chest and headache with this. It has stayed on for several days and she called to be added as a new patient. The lowest she saw was 145/98. Today in the office is actually controlled. She has had some chest pressure. This was with the elevated blood pressures and otherwise sporadically. She is somewhat limited in activities and is disabled because of chronic joint problems and having had many surgeries on her leg. She can go through the grocery store. She's not been able to bring on the symptoms. It come on sporadically. She says they've been pressure and pretty uncomfortable when it happens. She also describes some heartburn-like sensation when her blood pressure was at its peak. She has not had any shortness of breath, PND or orthopnea. She's not had any palpitations, presyncope or syncope. She's had no weight gain or edema.  Of note the patient has had admissions for chest discomfort before. I reviewed 2014 records. We have not seen her but she was seen by the primary team. Her pain was felt to be atypical. She did have an echocardiogram which was unremarkable.   Past Medical History  Diagnosis Date  . Anxiety   . IC (interstitial cystitis)   . Osteoporosis   . Irritable bowel syndrome (IBS)   . History of hiatal hernia   . Migraines   . Hypothyroidism (acquired)   . History of kidney stones 04/2014  . Pain syndrome, chronic   . Arthritis    right thumb  . Degenerative disc disease, cervical   . Degenerative disc disease, lumbar   . Chronic, continuous use of opioids   . PONV (postoperative nausea and vomiting)     uncontrollable itching with anesthesia; hx. of anesthesia awareness  . Seasonal allergies   . Sciatica     bilateral  . Fibromyalgia   . Dental crowns present   . Difficulty swallowing pills   . Difficult intravenous access   . Delayed gastric emptying   . Esophageal dysmotility   . Hemangioma, renal   . Hemangioma of liver   . Limited joint range of motion     right knee - unable to fully extend right leg  . Knee pain, right     states gets "nerves burned" periodically    Past Surgical History  Procedure Laterality Date  . Total thyroidectomy  1993  . Cysto with hydrodistension  01/29/2011    Procedure: CYSTOSCOPY/HYDRODISTENSION;  Surgeon: Claybon Jabs, MD;  Location: [facility];  Service: Urology;  Laterality: N/A;  . Cystoscopy w/ retrogrades  01/29/2011    Procedure: CYSTOSCOPY WITH RETROGRADE PYELOGRAM;  Surgeon: Claybon Jabs, MD;  Location: [facility];  Service: Urology;  Laterality: Bilateral;  . Fissurectomy  ~ 1988 x2  . Refractive surgery Bilateral ~ 1999; ~ 2013  . Nasal sinus surgery  2013  . Laparoscopic lysis of adhesions  02/11/2012    Procedure: LAPAROSCOPIC LYSIS OF ADHESIONS;  Surgeon: Cheri Fowler, MD;  Location: [facility];  Service: Gynecology;;  . Excision haglund's deformity with achilles tendon repair Bilateral 2013-2014  . Melanoma excision Left 2013    "behind knee"   . Cystoscopy with stent placement  2006    URETERAL  . Appendectomy  ~ 1992  . Tonsillectomy and adenoidectomy  ~ 1989  . Laparoscopy adhesiolysis and removal possible right ovary remnant  04/18/2000  . Knee arthroscopy Right 08/16/2000  . Carpal tunnel release Right 07/26/2001  . Debridement right thumb mp joint Right 04/10/2002  . Right thumb fusion of mpj Right 10/03/2002  .  Decompression left ulnar nerve, elbow Left 01/29/2003  . Right total knee revision arthroplasty Right 03/14/2007  . Cysto/ urethral dilation/ hydrodistention/ bladder bx  04/10/2003  . Laparoscopic assisted vaginal hysterectomy  Feb 1999  . Laparoscopy lysis adhesions/  bilateral salpingoophorectomy/  fulgeration of endometriosis  1998  . Anterior cervical decomp/discectomy fusion  03/10/2009    C5-6, C6-7  . Esophagogastroduodenoscopy (egd) with esophageal dilation  10-26-2000  . Cysto/  bladder bx  03/08/2008  . Transthoracic echocardiogram  09/22/2012  . Cysto with hydrodistension N/A 05/08/2014    Procedure: CYSTOSCOPY/HYDRODISTENSION WITH BILATERAL RETROGRADES;  Surgeon: Ardis Hughs, MD;  Location: [facility];  Service: Urology;  Laterality: N/A;  . Laparoscopy  02/11/2012    Procedure: LAPAROSCOPY DIAGNOSTIC;  Surgeon: Cheri Fowler, MD;  Location: [facility];  Service: Gynecology;  Laterality: N/A;  Diagnostic  Operative Laparoscopic   . Transanal hemorrhoidal dearterialization  03/26/2011  . Knee arthroscopy with fulkerson slide Right 11/29/2000  . Knee arthroscopy w/ lateral release Right 11/29/2000    states 13 surgeries on right knee  . Laparoscopic cholecystectomy  03/04/2002  . Total knee arthroplasty Right 09/10/2003; 12/08/2012  . Carpal tunnel release Left   . Melanoma excision      scalp  . Augmentation mammaplasty Bilateral 1996; 2001  . Breast surgery  1980's    accessory breast tissue exc.  . Carpometacarpel suspension plasty Right 02/10/2015    Procedure: RIGHT THUMB SUSPENSION  ARTHROPLASTY;  Surgeon: Milly Jakob, MD;  Location: [place];  Service: Orthopedics;  Laterality: Right;  ANESTHESIA: PRE- OP BLOCK  . Ganglion cyst excision Right 02/10/2015    Procedure: REMOVAL GANGLION OF WRIST, PARTIAL ULNAR EXCISION;  Surgeon: Milly Jakob, MD;  Location: [place];  Service: Orthopedics;  Laterality: Right;  . Negative  sleep study  2006 approx .  per pt  . Cysto with hydrodistension N/A 03/27/2015    Procedure: CYSTOSCOPY/HYDRODISTENSION OF BLADDER, INSTILLATION OF MARCAINE/PYRIDIUM;  Surgeon: Ardis Hughs, MD;  Location: [facility];  Service: Urology;  Laterality: N/A;  . Hand surgery Right 01-31-15     Current Outpatient Prescriptions  Medication Sig Dispense Refill  . albuterol (PROVENTIL) (2.5 MG/3ML) 0.083% nebulizer solution Take 3 mLs (2.5 mg total) by nebulization every 4 (four) hours as needed for wheezing (or coughing spells). 25 vial 1  . ALPRAZolam (XANAX) 0.5 MG tablet Take 0.5 mg by mouth 3 (three) times daily as needed. Anxiety    . cyanocobalamin (,VITAMIN B-12,) 1000 MCG/ML injection Inject 1,000 mcg into the muscle once a week. Reported on 03/25/2015    . diphenhydrAMINE (BENADRYL) 50 MG tablet Take 50 mg by mouth every 6 (six) hours as needed (prn , alternates with hydroxyzine).     Marland Kitchen EPINEPHrine (EPIPEN 2-PAK) 0.3 mg/0.3 mL IJ SOAJ  injection Inject 0.3 mLs (0.3 mg total) into the muscle as needed (in the event of a severe life-threatening reaction). 2 Device 2  . estradiol (ESTRACE) 0.5 MG tablet Take 0.5 mg by mouth at bedtime.     Marland Kitchen EVZIO 0.4 MG/0.4ML SOAJ AUTO INJECT AS NEEDED  0  . fluticasone (FLONASE) 50 MCG/ACT nasal spray Place 2 sprays into both nostrils daily. 16 g 5  . fluticasone-salmeterol (ADVAIR HFA) 230-21 MCG/ACT inhaler Inhale 1 puff into the lungs 2 (two) times daily.    Marland Kitchen ketoconazole (NIZORAL) 2 % shampoo APPLICATIONS APPLY ON THE SKIN WASH HAIR 2-3 TIMES WEEKLY  3  . levothyroxine (SYNTHROID, LEVOTHROID) 50 MCG tablet Take 50 mcg by mouth 2 (two) times daily. Pt can only take name brand    . mometasone-formoterol (DULERA) 200-5 MCG/ACT AERO INHALE TWO PUFFS TWICE DAILY TO PREVENT COUGH OR WHEEZE. RINSE, GARGLE, AND SPIT AFTER USE. USE WITH SPACER. 13 g 5  . promethazine (PHENERGAN) 25 MG tablet Take 25 mg by mouth every 4 (four) hours as needed.       . topiramate (TOPAMAX) 50 MG tablet Take 50 mg by mouth 2 (two) times daily.     Marland Kitchen UNABLE TO FIND once a week. Allergy injections - once  a week    . Zoledronic Acid (RECLAST IV) Inject into the vein as directed. Yearly --  Last dose Oct 2015     Current Facility-Administered Medications  Medication Dose Route Frequency Provider Last Rate Last Dose  . ropivacaine (PF) 2 mg/ml (0.2%) (NAROPIN) epidural 9 mL  9 mL Other Once Milinda Pointer, MD        Allergies:   Iodinated diagnostic agents; Red dye; Chloraprep one step; Cobalt; Corticosteroids; Eggs or egg-derived products; Latex; Morphine and related; Nickel; Other; Peanuts; Supartz; Surgical lubricant; Tape; Yellow dyes (non-tartrazine); Azithromycin; Flexeril; and Soap    Social History:  The patient  reports that she quit smoking about 9 years ago. She has never used smokeless tobacco. She reports that she drinks alcohol. She reports that she does not use illicit drugs.   Family History:  The patient's family history includes Anesthesia problems in her mother; Cancer in her daughter, maternal aunt, and mother; Leukemia in her mother.    ROS:  Please see the history of present illness.   Otherwise, review of systems are positive for Headaches, dizziness, nausea, irritable bowel syndrome, heartburn, joint pain, back problems.   All other systems are reviewed and negative.    PHYSICAL EXAM: VS:  BP 120/80 mmHg  Pulse 86  Ht 5\' 3"  (1.6 m)  Wt 132 lb 11.2 oz (60.192 kg)  BMI 23.51 kg/m2 , BMI Body mass index is 23.51 kg/(m^2). GENERAL:  Well appearing HEENT:  Pupils equal round and reactive, fundi not visualized, oral mucosa unremarkable NECK:  No jugular venous distention, waveform within normal limits, carotid upstroke brisk and symmetric, no bruits, no thyromegaly LYMPHATICS:  No cervical, inguinal adenopathy LUNGS:  Clear to auscultation bilaterally BACK:  No CVA tenderness CHEST:  Unremarkable HEART:  PMI not displaced or  sustained,S1 and S2 within normal limits, no S3, no S4, no clicks, no rubs, no murmurs ABD:  Flat, positive bowel sounds normal in frequency in pitch, no bruits, no rebound, no guarding, no midline pulsatile mass, no hepatomegaly, no splenomegaly EXT:  2 plus pulses throughout, no edema, no cyanosis no clubbing SKIN:  No rashes no nodules NEURO:  Cranial nerves II through XII grossly intact, motor grossly intact throughout  PSYCH:  Cognitively intact, oriented to person place and time    EKG:  EKG is ordered today. The ekg ordered today demonstrates sinus rhythm, rate 86, axis within normal limits, intervals within normal limits, no acute ST-T wave changes.   Recent Labs: 03/27/2015: Hemoglobin 14.5    Lipid Panel No results found for: CHOL, TRIG, HDL, CHOLHDL, VLDL, LDLCALC, LDLDIRECT    Wt Readings from Last 3 Encounters:  05/08/15 132 lb 11.2 oz (60.192 kg)  05/06/15 126 lb (57.153 kg)  04/21/15 125 lb (56.7 kg)      Other studies Reviewed: Additional studies/ records that were reviewed today include: Echo from 2014. Review of the above records demonstrates:  Please see elsewhere in the note.     ASSESSMENT AND PLAN:  CHEST PAIN: The pain is atypical. The pretest probability of obstructive coronary disease is somewhat low. However, given second-degree relatives who've had early heart disease stress testing will be indicated.  I will bring the patient back for a POET (Plain Old Exercise Test). This will allow me to screen for obstructive coronary disease, risk stratify and very importantly provide a prescription for exercise.  Marland KitchenHTN:    Her blood pressure is elevated currently. She'll continue to keep a blood pressure diary. Further management will be based on any sustained trends   Current medicines are reviewed at length with the patient today.  The patient does not have concerns regarding medicines.  The following changes have been made:  no change  Labs/ tests ordered  today include:   Orders Placed This Encounter  Procedures  . Exercise Tolerance Test  . EKG 12-Lead     Disposition:   FU with me as needed.      Signed, Minus Breeding, MD  05/08/2015 12:55 PM    [facility]

## 2015-05-08 ENCOUNTER — Ambulatory Visit (INDEPENDENT_AMBULATORY_CARE_PROVIDER_SITE_OTHER): Payer: BLUE CROSS/BLUE SHIELD | Admitting: Cardiology

## 2015-05-08 ENCOUNTER — Encounter: Payer: Self-pay | Admitting: Cardiology

## 2015-05-08 ENCOUNTER — Ambulatory Visit (HOSPITAL_COMMUNITY)
Admission: RE | Admit: 2015-05-08 | Discharge: 2015-05-08 | Disposition: A | Discharge status: Home / Self Care | Payer: BLUE CROSS/BLUE SHIELD | Source: Ambulatory Visit | Attending: Cardiology | Admitting: Cardiology

## 2015-05-08 VITALS — BP 120/80 | HR 86 | Ht 63.0 in | Wt 132.7 lb

## 2015-05-08 DIAGNOSIS — R079 Chest pain, unspecified: Secondary | ICD-10-CM | POA: Diagnosis not present

## 2015-05-08 DIAGNOSIS — Z9289 Personal history of other medical treatment: Secondary | ICD-10-CM

## 2015-05-08 HISTORY — DX: Personal history of other medical treatment: Z92.89

## 2015-05-08 LAB — EXERCISE TOLERANCE TEST
CSEPED: 6 min
CSEPEW: 7 METS
CSEPPHR: 171 {beats}/min
MPHR: 172 {beats}/min
Percent HR: 99 %
RPE: 16
Rest HR: 115 {beats}/min

## 2015-05-08 NOTE — Patient Instructions (Signed)
Your physician recommends that you schedule a follow-up appointment in: As Needed  Your physician has requested that you have an exercise tolerance test. For further information please visit [email or web address]. Please also follow instruction sheet, as given.    

## 2015-05-16 DIAGNOSIS — J00 Acute nasopharyngitis [common cold]: Secondary | ICD-10-CM | POA: Diagnosis not present

## 2015-05-16 DIAGNOSIS — J029 Acute pharyngitis, unspecified: Secondary | ICD-10-CM | POA: Diagnosis not present

## 2015-05-16 DIAGNOSIS — J111 Influenza due to unidentified influenza virus with other respiratory manifestations: Secondary | ICD-10-CM | POA: Diagnosis not present

## 2015-05-24 ENCOUNTER — Encounter (HOSPITAL_BASED_OUTPATIENT_CLINIC_OR_DEPARTMENT_OTHER): Payer: Self-pay | Admitting: Emergency Medicine

## 2015-05-24 ENCOUNTER — Emergency Department (HOSPITAL_BASED_OUTPATIENT_CLINIC_OR_DEPARTMENT_OTHER)
Admission: EM | Admit: 2015-05-24 | Discharge: 2015-05-24 | Disposition: A | Discharge status: Home / Self Care | Payer: BLUE CROSS/BLUE SHIELD | Attending: Emergency Medicine | Admitting: Emergency Medicine

## 2015-05-24 DIAGNOSIS — Z87448 Personal history of other diseases of urinary system: Secondary | ICD-10-CM | POA: Diagnosis not present

## 2015-05-24 DIAGNOSIS — Z8709 Personal history of other diseases of the respiratory system: Secondary | ICD-10-CM | POA: Insufficient documentation

## 2015-05-24 DIAGNOSIS — Z85528 Personal history of other malignant neoplasm of kidney: Secondary | ICD-10-CM | POA: Diagnosis not present

## 2015-05-24 DIAGNOSIS — Z91040 Latex allergy status: Secondary | ICD-10-CM | POA: Diagnosis not present

## 2015-05-24 DIAGNOSIS — F419 Anxiety disorder, unspecified: Secondary | ICD-10-CM | POA: Diagnosis not present

## 2015-05-24 DIAGNOSIS — M533 Sacrococcygeal disorders, not elsewhere classified: Secondary | ICD-10-CM | POA: Diagnosis not present

## 2015-05-24 DIAGNOSIS — G894 Chronic pain syndrome: Secondary | ICD-10-CM | POA: Insufficient documentation

## 2015-05-24 DIAGNOSIS — Z79899 Other long term (current) drug therapy: Secondary | ICD-10-CM | POA: Insufficient documentation

## 2015-05-24 DIAGNOSIS — M79604 Pain in right leg: Secondary | ICD-10-CM | POA: Diagnosis not present

## 2015-05-24 DIAGNOSIS — Z7951 Long term (current) use of inhaled steroids: Secondary | ICD-10-CM | POA: Insufficient documentation

## 2015-05-24 DIAGNOSIS — Z8719 Personal history of other diseases of the digestive system: Secondary | ICD-10-CM | POA: Insufficient documentation

## 2015-05-24 DIAGNOSIS — Z87891 Personal history of nicotine dependence: Secondary | ICD-10-CM | POA: Insufficient documentation

## 2015-05-24 DIAGNOSIS — M545 Low back pain: Secondary | ICD-10-CM | POA: Insufficient documentation

## 2015-05-24 DIAGNOSIS — E039 Hypothyroidism, unspecified: Secondary | ICD-10-CM | POA: Diagnosis not present

## 2015-05-24 DIAGNOSIS — G43909 Migraine, unspecified, not intractable, without status migrainosus: Secondary | ICD-10-CM | POA: Insufficient documentation

## 2015-05-24 DIAGNOSIS — Z8505 Personal history of malignant neoplasm of liver: Secondary | ICD-10-CM | POA: Diagnosis not present

## 2015-05-24 DIAGNOSIS — Z87442 Personal history of urinary calculi: Secondary | ICD-10-CM | POA: Diagnosis not present

## 2015-05-24 DIAGNOSIS — M810 Age-related osteoporosis without current pathological fracture: Secondary | ICD-10-CM | POA: Diagnosis not present

## 2015-05-24 DIAGNOSIS — M797 Fibromyalgia: Secondary | ICD-10-CM | POA: Insufficient documentation

## 2015-05-24 MED ORDER — HYDROCODONE-ACETAMINOPHEN 5-325 MG PO TABS
1.0000 | ORAL_TABLET | Freq: Once | ORAL | Status: AC
  Administered 2015-05-24: 1 via ORAL
  Filled 2015-05-24: qty 1

## 2015-05-24 MED ORDER — GABAPENTIN 300 MG PO CAPS: 300.0000 mg | ORAL_CAPSULE | Freq: Two times a day (BID) | ORAL | Status: DC

## 2015-05-24 NOTE — Discharge Instructions (Signed)
Follow up with your pain clinic as needed.   Sciatica With Rehab The sciatic nerve runs from the back down the leg and is responsible for sensation and control of the muscles in the back (posterior) side of the thigh, lower leg, and foot. Sciatica is a condition that is characterized by inflammation of this nerve.  SYMPTOMS   Signs of nerve damage, including numbness and/or weakness along the posterior side of the lower extremity.  Pain in the back of the thigh that may also travel down the leg.  Pain that worsens when sitting for long periods of time.  Occasionally, pain in the back or buttock. CAUSES  Inflammation of the sciatic nerve is the cause of sciatica. The inflammation is due to something irritating the nerve. Common sources of irritation include:  Sitting for long periods of time.  Direct trauma to the nerve.  Arthritis of the spine.  Herniated or ruptured disk.  Slipping of the vertebrae (spondylolisthesis).  Pressure from soft tissues, such as muscles or ligament-like tissue (fascia). RISK INCREASES WITH:  Sports that place pressure or stress on the spine (football or weightlifting).  Poor strength and flexibility.  Failure to warm up properly before activity.  Family history of low back pain or disk disorders.  Previous back injury or surgery.  Poor body mechanics, especially when lifting, or poor posture. PREVENTION   Warm up and stretch properly before activity.  Maintain physical fitness:  Strength, flexibility, and endurance.  Cardiovascular fitness.  Learn and use proper technique, especially with posture and lifting. When possible, have coach correct improper technique.  Avoid activities that place stress on the spine. PROGNOSIS If treated properly, then sciatica usually resolves within 6 weeks. However, occasionally surgery is necessary.  RELATED COMPLICATIONS   Permanent nerve damage, including pain, numbness, tingle, or  weakness.  Chronic back pain.  Risks of surgery: infection, bleeding, nerve damage, or damage to surrounding tissues. TREATMENT Treatment initially involves resting from any activities that aggravate your symptoms. The use of ice and medication may help reduce pain and inflammation. The use of strengthening and stretching exercises may help reduce pain with activity. These exercises may be performed at home or with referral to a therapist. A therapist may recommend further treatments, such as transcutaneous electronic nerve stimulation (TENS) or ultrasound. Your caregiver may recommend corticosteroid injections to help reduce inflammation of the sciatic nerve. If symptoms persist despite non-surgical (conservative) treatment, then surgery may be recommended. MEDICATION  If pain medication is necessary, then nonsteroidal anti-inflammatory medications, such as aspirin and ibuprofen, or other minor pain relievers, such as acetaminophen, are often recommended.  Do not take pain medication for 7 days before surgery.  Prescription pain relievers may be given if deemed necessary by your caregiver. Use only as directed and only as much as you need.  Ointments applied to the skin may be helpful.  Corticosteroid injections may be given by your caregiver. These injections should be reserved for the most serious cases, because they may only be given a certain number of times. HEAT AND COLD  Cold treatment (icing) relieves pain and reduces inflammation. Cold treatment should be applied for 10 to 15 minutes every 2 to 3 hours for inflammation and pain and immediately after any activity that aggravates your symptoms. Use ice packs or massage the area with a piece of ice (ice massage).  Heat treatment may be used prior to performing the stretching and strengthening activities prescribed by your caregiver, physical therapist, or athletic trainer. Use  a heat pack or soak the injury in warm water. SEEK MEDICAL  CARE IF:  Treatment seems to offer no benefit, or the condition worsens.  Any medications produce adverse side effects. EXERCISES  RANGE OF MOTION (ROM) AND STRETCHING EXERCISES - Sciatica Most people with sciatic will find that their symptoms worsen with either excessive bending forward (flexion) or arching at the low back (extension). The exercises which will help resolve your symptoms will focus on the opposite motion. Your physician, physical therapist or athletic trainer will help you determine which exercises will be most helpful to resolve your low back pain. Do not complete any exercises without first consulting with your clinician. Discontinue any exercises which worsen your symptoms until you speak to your clinician. If you have pain, numbness or tingling which travels down into your buttocks, leg or foot, the goal of the therapy is for these symptoms to move closer to your back and eventually resolve. Occasionally, these leg symptoms will get better, but your low back pain may worsen; this is typically an indication of progress in your rehabilitation. Be certain to be very alert to any changes in your symptoms and the activities in which you participated in the 24 hours prior to the change. Sharing this information with your clinician will allow him/her to most efficiently treat your condition. These exercises may help you when beginning to rehabilitate your injury. Your symptoms may resolve with or without further involvement from your physician, physical therapist or athletic trainer. While completing these exercises, remember:   Restoring tissue flexibility helps normal motion to return to the joints. This allows healthier, less painful movement and activity.  An effective stretch should be held for at least 30 seconds.  A stretch should never be painful. You should only feel a gentle lengthening or release in the stretched tissue. FLEXION RANGE OF MOTION AND STRETCHING  EXERCISES: STRETCH - Flexion, Single Knee to Chest   Lie on a firm bed or floor with both legs extended in front of you.  Keeping one leg in contact with the floor, bring your opposite knee to your chest. Hold your leg in place by either grabbing behind your thigh or at your knee.  Pull until you feel a gentle stretch in your low back. Hold __________ seconds.  Slowly release your grasp and repeat the exercise with the opposite side. Repeat __________ times. Complete this exercise __________ times per day.  STRETCH - Flexion, Double Knee to Chest  Lie on a firm bed or floor with both legs extended in front of you.  Keeping one leg in contact with the floor, bring your opposite knee to your chest.  Tense your stomach muscles to support your back and then lift your other knee to your chest. Hold your legs in place by either grabbing behind your thighs or at your knees.  Pull both knees toward your chest until you feel a gentle stretch in your low back. Hold __________ seconds.  Tense your stomach muscles and slowly return one leg at a time to the floor. Repeat __________ times. Complete this exercise __________ times per day.  STRETCH - Low Trunk Rotation   Lie on a firm bed or floor. Keeping your legs in front of you, bend your knees so they are both pointed toward the ceiling and your feet are flat on the floor.  Extend your arms out to the side. This will stabilize your upper body by keeping your shoulders in contact with the floor.  Gently  and slowly drop both knees together to one side until you feel a gentle stretch in your low back. Hold for __________ seconds.  Tense your stomach muscles to support your low back as you bring your knees back to the starting position. Repeat the exercise to the other side. Repeat __________ times. Complete this exercise __________ times per day  EXTENSION RANGE OF MOTION AND FLEXIBILITY EXERCISES: STRETCH - Extension, Prone on Elbows  Lie on  your stomach on the floor, a bed will be too soft. Place your palms about shoulder width apart and at the height of your head.  Place your elbows under your shoulders. If this is too painful, stack pillows under your chest.  Allow your body to relax so that your hips drop lower and make contact more completely with the floor.  Hold this position for __________ seconds.  Slowly return to lying flat on the floor. Repeat __________ times. Complete this exercise __________ times per day.  RANGE OF MOTION - Extension, Prone Press Ups  Lie on your stomach on the floor, a bed will be too soft. Place your palms about shoulder width apart and at the height of your head.  Keeping your back as relaxed as possible, slowly straighten your elbows while keeping your hips on the floor. You may adjust the placement of your hands to maximize your comfort. As you gain motion, your hands will come more underneath your shoulders.  Hold this position __________ seconds.  Slowly return to lying flat on the floor. Repeat __________ times. Complete this exercise __________ times per day.  STRENGTHENING EXERCISES - Sciatica  These exercises may help you when beginning to rehabilitate your injury. These exercises should be done near your "sweet spot." This is the neutral, low-back arch, somewhere between fully rounded and fully arched, that is your least painful position. When performed in this safe range of motion, these exercises can be used for people who have either a flexion or extension based injury. These exercises may resolve your symptoms with or without further involvement from your physician, physical therapist or athletic trainer. While completing these exercises, remember:   Muscles can gain both the endurance and the strength needed for everyday activities through controlled exercises.  Complete these exercises as instructed by your physician, physical therapist or athletic trainer. Progress with the  resistance and repetition exercises only as your caregiver advises.  You may experience muscle soreness or fatigue, but the pain or discomfort you are trying to eliminate should never worsen during these exercises. If this pain does worsen, stop and make certain you are following the directions exactly. If the pain is still present after adjustments, discontinue the exercise until you can discuss the trouble with your clinician. STRENGTHENING - Deep Abdominals, Pelvic Tilt   Lie on a firm bed or floor. Keeping your legs in front of you, bend your knees so they are both pointed toward the ceiling and your feet are flat on the floor.  Tense your lower abdominal muscles to press your low back into the floor. This motion will rotate your pelvis so that your tail bone is scooping upwards rather than pointing at your feet or into the floor.  With a gentle tension and even breathing, hold this position for __________ seconds. Repeat __________ times. Complete this exercise __________ times per day.  STRENGTHENING - Abdominals, Crunches   Lie on a firm bed or floor. Keeping your legs in front of you, bend your knees so they are both pointed  toward the ceiling and your feet are flat on the floor. Cross your arms over your chest.  Slightly tip your chin down without bending your neck.  Tense your abdominals and slowly lift your trunk high enough to just clear your shoulder blades. Lifting higher can put excessive stress on the low back and does not further strengthen your abdominal muscles.  Control your return to the starting position. Repeat __________ times. Complete this exercise __________ times per day.  STRENGTHENING - Quadruped, Opposite UE/LE Lift  Assume a hands and knees position on a firm surface. Keep your hands under your shoulders and your knees under your hips. You may place padding under your knees for comfort.  Find your neutral spine and gently tense your abdominal muscles so that  you can maintain this position. Your shoulders and hips should form a rectangle that is parallel with the floor and is not twisted.  Keeping your trunk steady, lift your right hand no higher than your shoulder and then your left leg no higher than your hip. Make sure you are not holding your breath. Hold this position __________ seconds.  Continuing to keep your abdominal muscles tense and your back steady, slowly return to your starting position. Repeat with the opposite arm and leg. Repeat __________ times. Complete this exercise __________ times per day.  STRENGTHENING - Abdominals and Quadriceps, Straight Leg Raise   Lie on a firm bed or floor with both legs extended in front of you.  Keeping one leg in contact with the floor, bend the other knee so that your foot can rest flat on the floor.  Find your neutral spine, and tense your abdominal muscles to maintain your spinal position throughout the exercise.  Slowly lift your straight leg off the floor about 6 inches for a count of 15, making sure to not hold your breath.  Still keeping your neutral spine, slowly lower your leg all the way to the floor. Repeat this exercise with each leg __________ times. Complete this exercise __________ times per day. POSTURE AND BODY MECHANICS CONSIDERATIONS - Sciatica Keeping correct posture when sitting, standing or completing your activities will reduce the stress put on different body tissues, allowing injured tissues a chance to heal and limiting painful experiences. The following are general guidelines for improved posture. Your physician or physical therapist will provide you with any instructions specific to your needs. While reading these guidelines, remember:  The exercises prescribed by your provider will help you have the flexibility and strength to maintain correct postures.  The correct posture provides the optimal environment for your joints to work. All of your joints have less wear and  tear when properly supported by a spine with good posture. This means you will experience a healthier, less painful body.  Correct posture must be practiced with all of your activities, especially prolonged sitting and standing. Correct posture is as important when doing repetitive low-stress activities (typing) as it is when doing a single heavy-load activity (lifting). RESTING POSITIONS Consider which positions are most painful for you when choosing a resting position. If you have pain with flexion-based activities (sitting, bending, stooping, squatting), choose a position that allows you to rest in a less flexed posture. You would want to avoid curling into a fetal position on your side. If your pain worsens with extension-based activities (prolonged standing, working overhead), avoid resting in an extended position such as sleeping on your stomach. Most people will find more comfort when they rest with their spine  in a more neutral position, neither too rounded nor too arched. Lying on a non-sagging bed on your side with a pillow between your knees, or on your back with a pillow under your knees will often provide some relief. Keep in mind, being in any one position for a prolonged period of time, no matter how correct your posture, can still lead to stiffness. PROPER SITTING POSTURE In order to minimize stress and discomfort on your spine, you must sit with correct posture Sitting with good posture should be effortless for a healthy body. Returning to good posture is a gradual process. Many people can work toward this most comfortably by using various supports until they have the flexibility and strength to maintain this posture on their own. When sitting with proper posture, your ears will fall over your shoulders and your shoulders will fall over your hips. You should use the back of the chair to support your upper back. Your low back will be in a neutral position, just slightly arched. You may place a  small pillow or folded towel at the base of your low back for support.  When working at a desk, create an environment that supports good, upright posture. Without extra support, muscles fatigue and lead to excessive strain on joints and other tissues. Keep these recommendations in mind: CHAIR:   A chair should be able to slide under your desk when your back makes contact with the back of the chair. This allows you to work closely.  The chair's height should allow your eyes to be level with the upper part of your monitor and your hands to be slightly lower than your elbows. BODY POSITION  Your feet should make contact with the floor. If this is not possible, use a foot rest.  Keep your ears over your shoulders. This will reduce stress on your neck and low back. INCORRECT SITTING POSTURES   If you are feeling tired and unable to assume a healthy sitting posture, do not slouch or slump. This puts excessive strain on your back tissues, causing more damage and pain. Healthier options include:  Using more support, like a lumbar pillow.  Switching tasks to something that requires you to be upright or walking.  Talking a brief walk.  Lying down to rest in a neutral-spine position. PROLONGED STANDING WHILE SLIGHTLY LEANING FORWARD  When completing a task that requires you to lean forward while standing in one place for a long time, place either foot up on a stationary 2-4 inch high object to help maintain the best posture. When both feet are on the ground, the low back tends to lose its slight inward curve. If this curve flattens (or becomes too large), then the back and your other joints will experience too much stress, fatigue more quickly and can cause pain.  CORRECT STANDING POSTURES Proper standing posture should be assumed with all daily activities, even if they only take a few moments, like when brushing your teeth. As in sitting, your ears should fall over your shoulders and your shoulders  should fall over your hips. You should keep a slight tension in your abdominal muscles to brace your spine. Your tailbone should point down to the ground, not behind your body, resulting in an over-extended swayback posture.  INCORRECT STANDING POSTURES  Common incorrect standing postures include a forward head, locked knees and/or an excessive swayback. WALKING Walk with an upright posture. Your ears, shoulders and hips should all line-up. PROLONGED ACTIVITY IN A FLEXED POSITION  When completing a task that requires you to bend forward at your waist or lean over a low surface, try to find a way to stabilize 3 of 4 of your limbs. You can place a hand or elbow on your thigh or rest a knee on the surface you are reaching across. This will provide you more stability so that your muscles do not fatigue as quickly. By keeping your knees relaxed, or slightly bent, you will also reduce stress across your low back. CORRECT LIFTING TECHNIQUES DO :   Assume a wide stance. This will provide you more stability and the opportunity to get as close as possible to the object which you are lifting.  Tense your abdominals to brace your spine; then bend at the knees and hips. Keeping your back locked in a neutral-spine position, lift using your leg muscles. Lift with your legs, keeping your back straight.  Test the weight of unknown objects before attempting to lift them.  Try to keep your elbows locked down at your sides in order get the best strength from your shoulders when carrying an object.  Always ask for help when lifting heavy or awkward objects. INCORRECT LIFTING TECHNIQUES DO NOT:   Lock your knees when lifting, even if it is a small object.  Bend and twist. Pivot at your feet or move your feet when needing to change directions.  Assume that you cannot safely pick up a paperclip without proper posture.   This information is not intended to replace advice given to you by your health care provider.  Make sure you discuss any questions you have with your health care provider.   Document Released: 02/08/2005 Document Revised: 06/25/2014 Document Reviewed: 05/23/2008 [employer] Interactive Patient Education [employer].

## 2015-05-24 NOTE — ED Provider Notes (Signed)
CSN: [id number]     Arrival date & time 05/24/15  2109 History   First MD Initiated Contact with Patient 05/24/15 2150     Chief Complaint  Patient presents with  . Leg Pain   Patient is a 50 y.o. female presenting with back pain.  Back Pain Location:  Sacro-iliac joint Quality:  Shooting Radiates to:  R foot Pain severity:  Severe Pain is:  Same all the time Timing:  Constant Progression:  Unchanged Chronicity:  Chronic Context: not falling, not MVA and not recent injury   Relieved by: Ambulating. Worsened by:  Lying down and sitting Ineffective treatments:  Heating pad and NSAIDs Associated symptoms: leg pain   Associated symptoms: no abdominal pain, no bladder incontinence, no bowel incontinence, no dysuria, no fever, no numbness, no perianal numbness and no weakness     Kari Green is a 50 year old female with an extensive past medical history including multiple chronic pain syndromes presenting with leg pain. Onset of pain was last evening. She states that she was laying on the couch watching TV when it began acutely. She describes it as right sided that originates in the lower back and radiates down the posterior thigh into the foot. She describes it as sharp and shooting. The pain is exacerbated by sitting and laying down flat. It somewhat alleviates when she ambulates. She has tried NSAIDs and a heating pad without relief. She has a history of sciatica but states "I had my nerves burned so I don't think it is that". Denies trauma to the back or hip. She has an appointment with her pain clinic in 2 days. She states that she does not have narcotic medications at home currently because they're trying to "get the clear so I can have a surgery". She is aware that being a pain patient means that she cannot be discharged with narcotic pain medication prescriptions. Denies fevers, chills, abdominal pain, dysuria, bowel or bladder incontinence or numbness of the lower extremities or weakness  of the lower extremities. She is still able to ambulate without difficulty.  Past Medical History  Diagnosis Date  . Anxiety   . IC (interstitial cystitis)   . Osteoporosis   . Irritable bowel syndrome (IBS)   . History of hiatal hernia   . Migraines   . Hypothyroidism (acquired)   . History of kidney stones 04/2014  . Pain syndrome, chronic   . Arthritis     right thumb  . Degenerative disc disease, cervical   . Degenerative disc disease, lumbar   . Chronic, continuous use of opioids   . PONV (postoperative nausea and vomiting)     uncontrollable itching with anesthesia; hx. of anesthesia awareness  . Seasonal allergies   . Sciatica     bilateral  . Fibromyalgia   . Dental crowns present   . Difficulty swallowing pills   . Difficult intravenous access   . Delayed gastric emptying   . Esophageal dysmotility   . Hemangioma, renal   . Hemangioma of liver   . Limited joint range of motion     right knee - unable to fully extend right leg  . Knee pain, right     states gets "nerves burned" periodically   Past Surgical History  Procedure Laterality Date  . Total thyroidectomy  1993  . Cysto with hydrodistension  01/29/2011    Procedure: CYSTOSCOPY/HYDRODISTENSION;  Surgeon: Claybon Jabs, MD;  Location: [facility];  Service: Urology;  Laterality: N/A;  .  Cystoscopy w/ retrogrades  01/29/2011    Procedure: CYSTOSCOPY WITH RETROGRADE PYELOGRAM;  Surgeon: Claybon Jabs, MD;  Location: [facility];  Service: Urology;  Laterality: Bilateral;  . Fissurectomy  ~ 1988 x2  . Refractive surgery Bilateral ~ 1999; ~ 2013  . Nasal sinus surgery  2013  . Laparoscopic lysis of adhesions  02/11/2012    Procedure: LAPAROSCOPIC LYSIS OF ADHESIONS;  Surgeon: Cheri Fowler, MD;  Location: [facility];  Service: Gynecology;;  . Excision haglund's deformity with achilles tendon repair Bilateral 2013-2014  . Melanoma excision Left 2013    "behind knee"   . Cystoscopy  with stent placement  2006    URETERAL  . Appendectomy  ~ 1992  . Tonsillectomy and adenoidectomy  ~ 1989  . Laparoscopy adhesiolysis and removal possible right ovary remnant  04/18/2000  . Knee arthroscopy Right 08/16/2000  . Carpal tunnel release Right 07/26/2001  . Debridement right thumb mp joint Right 04/10/2002  . Right thumb fusion of mpj Right 10/03/2002  . Decompression left ulnar nerve, elbow Left 01/29/2003  . Right total knee revision arthroplasty Right 03/14/2007  . Cysto/ urethral dilation/ hydrodistention/ bladder bx  04/10/2003  . Laparoscopic assisted vaginal hysterectomy  Feb 1999  . Laparoscopy lysis adhesions/  bilateral salpingoophorectomy/  fulgeration of endometriosis  1998  . Anterior cervical decomp/discectomy fusion  03/10/2009    C5-6, C6-7  . Esophagogastroduodenoscopy (egd) with esophageal dilation  10-26-2000  . Cysto/  bladder bx  03/08/2008  . Transthoracic echocardiogram  09/22/2012  . Cysto with hydrodistension N/A 05/08/2014    Procedure: CYSTOSCOPY/HYDRODISTENSION WITH BILATERAL RETROGRADES;  Surgeon: Ardis Hughs, MD;  Location: [facility];  Service: Urology;  Laterality: N/A;  . Laparoscopy  02/11/2012    Procedure: LAPAROSCOPY DIAGNOSTIC;  Surgeon: Cheri Fowler, MD;  Location: [facility];  Service: Gynecology;  Laterality: N/A;  Diagnostic  Operative Laparoscopic   . Transanal hemorrhoidal dearterialization  03/26/2011  . Knee arthroscopy with fulkerson slide Right 11/29/2000  . Knee arthroscopy w/ lateral release Right 11/29/2000    states 13 surgeries on right knee  . Laparoscopic cholecystectomy  03/04/2002  . Total knee arthroplasty Right 09/10/2003; 12/08/2012  . Carpal tunnel release Left   . Melanoma excision      scalp  . Augmentation mammaplasty Bilateral 1996; 2001  . Breast surgery  1980's    accessory breast tissue exc.  . Carpometacarpel suspension plasty Right 02/10/2015    Procedure: RIGHT THUMB SUSPENSION  ARTHROPLASTY;   Surgeon: Milly Jakob, MD;  Location: [facility];  Service: Orthopedics;  Laterality: Right;  ANESTHESIA: PRE- OP BLOCK  . Ganglion cyst excision Right 02/10/2015    Procedure: REMOVAL GANGLION OF WRIST, PARTIAL ULNAR EXCISION;  Surgeon: Milly Jakob, MD;  Location: [facility];  Service: Orthopedics;  Laterality: Right;  . Negative sleep study  2006 approx .  per pt  . Cysto with hydrodistension N/A 03/27/2015    Procedure: CYSTOSCOPY/HYDRODISTENSION OF BLADDER, INSTILLATION OF MARCAINE/PYRIDIUM;  Surgeon: Ardis Hughs, MD;  Location: [facility];  Service: Urology;  Laterality: N/A;  . Hand surgery Right 01-31-15   Family History  Problem Relation Age of Onset  . Anesthesia problems Mother   . Cancer Mother     ovarian  . Cancer Daughter     cervical  . Cancer Maternal Aunt     breast  . Leukemia Mother    Social History  Substance Use Topics  . Smoking status:  Former Smoker    Quit date: 02/22/2006  . Smokeless tobacco: Never Used  . Alcohol Use: 0.0 oz/week    0 Standard drinks or equivalent per week     Comment: rarely   OB History    Gravida Para Term Preterm AB TAB SAB Ectopic Multiple Living   0 0 0 0 0 0 0 0       Review of Systems  Constitutional: Negative for fever.  Gastrointestinal: Negative for abdominal pain and bowel incontinence.  Genitourinary: Negative for bladder incontinence and dysuria.  Musculoskeletal: Positive for back pain.  Neurological: Negative for weakness and numbness.  All other systems reviewed and are negative.     Allergies  Iodinated diagnostic agents; Red dye; Chloraprep one step; Cobalt; Corticosteroids; Eggs or egg-derived products; Latex; Morphine and related; Nickel; Other; Peanuts; Supartz; Surgical lubricant; Tape; Yellow dyes (non-tartrazine); Azithromycin; Flexeril; and Soap  Home Medications   Prior to Admission medications   Medication Sig Start Date End Date Taking?  Authorizing Provider  albuterol (PROVENTIL) (2.5 MG/3ML) 0.083% nebulizer solution Take 3 mLs (2.5 mg total) by nebulization every 4 (four) hours as needed for wheezing (or coughing spells). 03/25/15   Adelina Mings, MD  ALPRAZolam Duanne Moron) 0.5 MG tablet Take 0.5 mg by mouth 3 (three) times daily as needed. Anxiety    Historical Provider, MD  cyanocobalamin (,VITAMIN B-12,) 1000 MCG/ML injection Inject 1,000 mcg into the muscle once a week. Reported on 03/25/2015    Historical Provider, MD  diphenhydrAMINE (BENADRYL) 50 MG tablet Take 50 mg by mouth every 6 (six) hours as needed (prn , alternates with hydroxyzine).     Historical Provider, MD  EPINEPHrine (EPIPEN 2-PAK) 0.3 mg/0.3 mL IJ SOAJ injection Inject 0.3 mLs (0.3 mg total) into the muscle as needed (in the event of a severe life-threatening reaction). 12/09/14   Charlies Silvers, MD  estradiol (ESTRACE) 0.5 MG tablet Take 0.5 mg by mouth at bedtime.     Historical Provider, MD  EVZIO 0.4 MG/0.4ML SOAJ AUTO INJECT AS NEEDED 11/14/14   Historical Provider, MD  fluticasone (FLONASE) 50 MCG/ACT nasal spray Place 2 sprays into both nostrils daily. 12/09/14   Charlies Silvers, MD  fluticasone-salmeterol (ADVAIR HFA) 230-21 MCG/ACT inhaler Inhale 1 puff into the lungs 2 (two) times daily.    Historical Provider, MD  gabapentin (NEURONTIN) 300 MG capsule Take 1 capsule (300 mg total) by mouth 2 (two) times daily. 05/24/15   Candis Kabel, PA-C  ketoconazole (NIZORAL) 2 % shampoo APPLICATIONS APPLY ON THE SKIN [place] 2-3 TIMES WEEKLY 11/19/14   Historical Provider, MD  levothyroxine (SYNTHROID, LEVOTHROID) 50 MCG tablet Take 50 mcg by mouth 2 (two) times daily. Pt can only take name brand    Historical Provider, MD  mometasone-formoterol (DULERA) 200-5 MCG/ACT AERO INHALE TWO PUFFS TWICE DAILY TO PREVENT COUGH OR WHEEZE. RINSE, GARGLE, AND SPIT AFTER USE. USE WITH SPACER. 03/25/15   Adelina Mings, MD  promethazine (PHENERGAN) 25 MG tablet Take 25  mg by mouth every 4 (four) hours as needed.     Historical Provider, MD  topiramate (TOPAMAX) 50 MG tablet Take 50 mg by mouth 2 (two) times daily.     Historical Provider, MD  UNABLE TO FIND once a week. Allergy injections - once  a week    Historical Provider, MD  Zoledronic Acid (RECLAST IV) Inject into the vein as directed. Yearly --  Last dose Oct 2015    Historical Provider, MD  BP 164/97 mmHg  Pulse 100  Temp(Src) 97.7 F (36.5 C) (Oral)  Resp 20  Ht 5\' 2"  (1.575 m)  Wt 57.607 kg  BMI 23.22 kg/m2  SpO2 100% Physical Exam  Constitutional: She appears well-developed and well-nourished. No distress.  Nontoxic-appearing  HENT:  Head: Normocephalic and atraumatic.  Right Ear: External ear normal.  Left Ear: External ear normal.  Eyes: Conjunctivae are normal. Right eye exhibits no discharge. Left eye exhibits no discharge. No scleral icterus.  Neck: Normal range of motion.  Cardiovascular: Normal rate and intact distal pulses.   Pedal pulses palpable  Pulmonary/Chest: Effort normal.  Abdominal: Soft. She exhibits no distension. There is no tenderness.  Musculoskeletal: Normal range of motion.       Lumbar back: She exhibits tenderness. She exhibits normal range of motion and no deformity.  Point tenderness at right SI joint. Full range of motion of lumbar back intact. No focal tenderness over lumbar spine. No bony deformities of the lumbar spine. Full range of motion of bilateral lower extremities. Negative straight leg raise. Patient ambulates with a steady gait  Neurological: She is alert. Coordination normal.  5/5 strength of the bilateral lower extremities. Sensation to light touch intact throughout.  Skin: Skin is warm and dry.  Psychiatric: She has a normal mood and affect. Her behavior is normal.  Nursing note and vitals reviewed.   ED Course  Procedures (including critical care time) Labs Review Labs Reviewed - No data to display  Imaging Review No results  found. I have personally reviewed and evaluated these images and lab results as part of my medical decision-making.   EKG Interpretation None      MDM   Final diagnoses:  Right leg pain   Patient presenting with back pain and leg pain x one day. Patient has long history of chronic pain and is followed by pain management. She is diagnoses of complex regional pain syndrome, bilateral sciatica and fibromyalgia. Afebrile. Non-focal neuro exam. Point tenderness at right SI joint. No tenderness over the lumbar region or lumbar spine. Full range of motion of the lumbar spine and bilateral lower extremities intact. Negative straight leg raise test. Patient is able to ambulate though with some discomfort. No loss of bowel or bladder control. No numbness or weakness in the lower extremities. No concern for cauda equina. No history of IVDU or cancer. Presentation consistent with acute worsening of chronic pain. Given 1 Vicodin in emergency department. Discussed that she is a pain clinic patient and I will not be able to discharge with narcotics. Patient states understanding. Presentation consistent with past episodes of sciatica; will discharge with Neurontin to augment her home pain medications. Patient is to follow-up with her pain clinic in 2 days. Return precautions discussed and given in discharge paperwork. Pt is stable for discharge.      Lahoma Crocker Kelsie Kramp, PA-C 05/25/15 [id number]  Gareth Morgan, MD 05/26/15 1544

## 2015-05-24 NOTE — ED Notes (Addendum)
Pt in c/o posterior RLE pain above the knee onset last night, states hx of sciatica but this feels different. Tx with OTC meds with no relief. Ambulatory with steady gait.

## 2015-05-26 DIAGNOSIS — M542 Cervicalgia: Secondary | ICD-10-CM | POA: Diagnosis not present

## 2015-05-26 DIAGNOSIS — M79641 Pain in right hand: Secondary | ICD-10-CM | POA: Diagnosis not present

## 2015-05-26 DIAGNOSIS — M25512 Pain in left shoulder: Secondary | ICD-10-CM | POA: Diagnosis not present

## 2015-05-26 DIAGNOSIS — G894 Chronic pain syndrome: Secondary | ICD-10-CM | POA: Diagnosis not present

## 2015-05-26 DIAGNOSIS — M5412 Radiculopathy, cervical region: Secondary | ICD-10-CM | POA: Diagnosis not present

## 2015-05-26 DIAGNOSIS — M25561 Pain in right knee: Secondary | ICD-10-CM | POA: Diagnosis not present

## 2015-05-26 DIAGNOSIS — M79604 Pain in right leg: Secondary | ICD-10-CM | POA: Diagnosis not present

## 2015-05-29 ENCOUNTER — Ambulatory Visit (INDEPENDENT_AMBULATORY_CARE_PROVIDER_SITE_OTHER): Payer: BLUE CROSS/BLUE SHIELD

## 2015-05-29 DIAGNOSIS — R590 Localized enlarged lymph nodes: Secondary | ICD-10-CM | POA: Insufficient documentation

## 2015-05-29 DIAGNOSIS — I889 Nonspecific lymphadenitis, unspecified: Secondary | ICD-10-CM | POA: Diagnosis not present

## 2015-05-29 DIAGNOSIS — J309 Allergic rhinitis, unspecified: Secondary | ICD-10-CM | POA: Diagnosis not present

## 2015-06-02 DIAGNOSIS — M5412 Radiculopathy, cervical region: Secondary | ICD-10-CM | POA: Diagnosis not present

## 2015-06-03 ENCOUNTER — Other Ambulatory Visit: Payer: Self-pay

## 2015-06-03 DIAGNOSIS — Z1231 Encounter for screening mammogram for malignant neoplasm of breast: Secondary | ICD-10-CM

## 2015-06-04 DIAGNOSIS — J301 Allergic rhinitis due to pollen: Secondary | ICD-10-CM | POA: Diagnosis not present

## 2015-06-05 DIAGNOSIS — J3089 Other allergic rhinitis: Secondary | ICD-10-CM | POA: Diagnosis not present

## 2015-06-06 ENCOUNTER — Emergency Department (HOSPITAL_COMMUNITY)
Admission: EM | Admit: 2015-06-06 | Discharge: 2015-06-06 | Disposition: A | Discharge status: Home / Self Care | Payer: BLUE CROSS/BLUE SHIELD | Attending: Emergency Medicine | Admitting: Emergency Medicine

## 2015-06-06 ENCOUNTER — Emergency Department (HOSPITAL_COMMUNITY): Payer: BLUE CROSS/BLUE SHIELD

## 2015-06-06 ENCOUNTER — Encounter (HOSPITAL_COMMUNITY): Payer: Self-pay

## 2015-06-06 DIAGNOSIS — Z7951 Long term (current) use of inhaled steroids: Secondary | ICD-10-CM | POA: Diagnosis not present

## 2015-06-06 DIAGNOSIS — Z90710 Acquired absence of both cervix and uterus: Secondary | ICD-10-CM | POA: Insufficient documentation

## 2015-06-06 DIAGNOSIS — N200 Calculus of kidney: Secondary | ICD-10-CM | POA: Diagnosis not present

## 2015-06-06 DIAGNOSIS — R319 Hematuria, unspecified: Secondary | ICD-10-CM

## 2015-06-06 DIAGNOSIS — R1030 Lower abdominal pain, unspecified: Secondary | ICD-10-CM | POA: Diagnosis not present

## 2015-06-06 DIAGNOSIS — Z8719 Personal history of other diseases of the digestive system: Secondary | ICD-10-CM | POA: Insufficient documentation

## 2015-06-06 DIAGNOSIS — Z87448 Personal history of other diseases of urinary system: Secondary | ICD-10-CM | POA: Diagnosis not present

## 2015-06-06 DIAGNOSIS — Z79899 Other long term (current) drug therapy: Secondary | ICD-10-CM | POA: Insufficient documentation

## 2015-06-06 DIAGNOSIS — Z79818 Long term (current) use of other agents affecting estrogen receptors and estrogen levels: Secondary | ICD-10-CM | POA: Diagnosis not present

## 2015-06-06 DIAGNOSIS — Z91040 Latex allergy status: Secondary | ICD-10-CM | POA: Diagnosis not present

## 2015-06-06 DIAGNOSIS — F419 Anxiety disorder, unspecified: Secondary | ICD-10-CM | POA: Insufficient documentation

## 2015-06-06 DIAGNOSIS — M545 Low back pain: Secondary | ICD-10-CM | POA: Diagnosis not present

## 2015-06-06 DIAGNOSIS — Z87891 Personal history of nicotine dependence: Secondary | ICD-10-CM | POA: Insufficient documentation

## 2015-06-06 DIAGNOSIS — Z8739 Personal history of other diseases of the musculoskeletal system and connective tissue: Secondary | ICD-10-CM | POA: Diagnosis not present

## 2015-06-06 DIAGNOSIS — Z87442 Personal history of urinary calculi: Secondary | ICD-10-CM | POA: Diagnosis not present

## 2015-06-06 DIAGNOSIS — Z9049 Acquired absence of other specified parts of digestive tract: Secondary | ICD-10-CM | POA: Diagnosis not present

## 2015-06-06 DIAGNOSIS — R102 Pelvic and perineal pain: Secondary | ICD-10-CM

## 2015-06-06 DIAGNOSIS — M542 Cervicalgia: Secondary | ICD-10-CM | POA: Diagnosis not present

## 2015-06-06 DIAGNOSIS — E039 Hypothyroidism, unspecified: Secondary | ICD-10-CM | POA: Insufficient documentation

## 2015-06-06 DIAGNOSIS — Z86018 Personal history of other benign neoplasm: Secondary | ICD-10-CM | POA: Insufficient documentation

## 2015-06-06 DIAGNOSIS — G894 Chronic pain syndrome: Secondary | ICD-10-CM | POA: Insufficient documentation

## 2015-06-06 DIAGNOSIS — G43909 Migraine, unspecified, not intractable, without status migrainosus: Secondary | ICD-10-CM | POA: Diagnosis not present

## 2015-06-06 LAB — URINALYSIS, ROUTINE W REFLEX MICROSCOPIC
Bilirubin Urine: NEGATIVE
Glucose, UA: NEGATIVE mg/dL
Ketones, ur: NEGATIVE mg/dL
NITRITE: NEGATIVE
PH: 6.5 (ref 5.0–8.0)
Protein, ur: NEGATIVE mg/dL
SPECIFIC GRAVITY, URINE: 1.018 (ref 1.005–1.030)

## 2015-06-06 LAB — COMPREHENSIVE METABOLIC PANEL
ALBUMIN: 3.8 g/dL (ref 3.5–5.0)
ALK PHOS: 67 U/L (ref 38–126)
ALT: 31 U/L (ref 14–54)
AST: 24 U/L (ref 15–41)
Anion gap: 11 (ref 5–15)
BILIRUBIN TOTAL: 0.6 mg/dL (ref 0.3–1.2)
BUN: 26 mg/dL — AB (ref 6–20)
CALCIUM: 9.2 mg/dL (ref 8.9–10.3)
CO2: 23 mmol/L (ref 22–32)
Chloride: 108 mmol/L (ref 101–111)
Creatinine, Ser: 1.07 mg/dL — ABNORMAL HIGH (ref 0.44–1.00)
GFR calc Af Amer: >60 mL/min (ref >60)
GFR calc non Af Amer: >60 mL/min (ref >60)
GLUCOSE: 103 mg/dL — AB (ref 65–99)
POTASSIUM: 3.9 mmol/L (ref 3.5–5.1)
Sodium: 142 mmol/L (ref 135–145)
TOTAL PROTEIN: 6.9 g/dL (ref 6.5–8.1)

## 2015-06-06 LAB — URINE MICROSCOPIC-ADD ON

## 2015-06-06 LAB — CBC
HEMATOCRIT: 36.8 % (ref 36.0–46.0)
Hemoglobin: 12.4 g/dL (ref 12.0–15.0)
MCH: 28.5 pg (ref 26.0–34.0)
MCHC: 33.7 g/dL (ref 30.0–36.0)
MCV: 84.6 fL (ref 78.0–100.0)
Platelets: 343 10*3/uL (ref 150–400)
RBC: 4.35 MIL/uL (ref 3.87–5.11)
RDW: 13.6 % (ref 11.5–15.5)
WBC: 7.6 10*3/uL (ref 4.0–10.5)

## 2015-06-06 LAB — I-STAT BETA HCG BLOOD, ED (MC, WL, AP ONLY): HCG, QUANTITATIVE: 6 m[IU]/mL — AB (ref <5)

## 2015-06-06 LAB — LIPASE, BLOOD: Lipase: 28 U/L (ref 11–51)

## 2015-06-06 MED ORDER — SODIUM CHLORIDE 0.9 % IV SOLN: INTRAVENOUS | Status: DC

## 2015-06-06 MED ORDER — DIATRIZOATE MEGLUMINE & SODIUM 66-10 % PO SOLN
30.0000 mL | Freq: Once | ORAL | Status: AC
  Administered 2015-06-06: 30 mL via ORAL

## 2015-06-06 MED ORDER — FENTANYL CITRATE (PF) 100 MCG/2ML IJ SOLN
50.0000 ug | Freq: Once | INTRAMUSCULAR | Status: AC
  Administered 2015-06-06: 50 ug via INTRAMUSCULAR

## 2015-06-06 MED ORDER — ONDANSETRON 8 MG PO TBDP
8.0000 mg | ORAL_TABLET | Freq: Once | ORAL | Status: AC
  Administered 2015-06-06: 8 mg via ORAL
  Filled 2015-06-06: qty 1

## 2015-06-06 MED ORDER — FENTANYL CITRATE (PF) 100 MCG/2ML IJ SOLN
50.0000 ug | INTRAMUSCULAR | Status: DC | PRN
  Filled 2015-06-06: qty 2

## 2015-06-06 NOTE — ED Provider Notes (Signed)
CSN: [id number]     Arrival date & time 06/06/15  0945 History   First MD Initiated Contact with Patient 06/06/15 1116     Chief Complaint  Patient presents with  . Hematuria  . Flank Pain  . Abdominal Pain      HPI Pt was seen at 1120. Per pt, c/o gradual onset and persistence of intermittent "hematuria" for the past 1 to 2 years, worse since yesterday. Has been associated with suprapubic abd "pain" which radiates into her back. Pt states she called her Uro MD and was told to come to the ED "to r/o bladder cancer." Denies any change in her chronic symptom pattern. Denies N/V/D, no dysuria, no fevers, no rash, no CP/SOB.     Uro: Dr. Louis Meckel Past Medical History  Diagnosis Date  . Anxiety   . IC (interstitial cystitis)   . Osteoporosis   . Irritable bowel syndrome (IBS)   . History of hiatal hernia   . Migraines   . Hypothyroidism (acquired)   . History of kidney stones 04/2014  . Pain syndrome, chronic   . Arthritis     right thumb  . Degenerative disc disease, cervical   . Degenerative disc disease, lumbar   . Chronic, continuous use of opioids   . PONV (postoperative nausea and vomiting)     uncontrollable itching with anesthesia; hx. of anesthesia awareness  . Seasonal allergies   . Sciatica     bilateral  . Fibromyalgia   . Dental crowns present   . Difficulty swallowing pills   . Difficult intravenous access   . Delayed gastric emptying   . Esophageal dysmotility   . Hemangioma, renal   . Hemangioma of liver   . Limited joint range of motion     right knee - unable to fully extend right leg  . Knee pain, right     states gets "nerves burned" periodically  . Cognitive dysfunction   . Pain management   . Kidney stone    Past Surgical History  Procedure Laterality Date  . Total thyroidectomy  1993  . Cysto with hydrodistension  01/29/2011    Procedure: CYSTOSCOPY/HYDRODISTENSION;  Surgeon: Claybon Jabs, MD;  Location: [facility];  Service:  Urology;  Laterality: N/A;  . Cystoscopy w/ retrogrades  01/29/2011    Procedure: CYSTOSCOPY WITH RETROGRADE PYELOGRAM;  Surgeon: Claybon Jabs, MD;  Location: [facility];  Service: Urology;  Laterality: Bilateral;  . Fissurectomy  ~ 1988 x2  . Refractive surgery Bilateral ~ 1999; ~ 2013  . Nasal sinus surgery  2013  . Laparoscopic lysis of adhesions  02/11/2012    Procedure: LAPAROSCOPIC LYSIS OF ADHESIONS;  Surgeon: Cheri Fowler, MD;  Location: [facility];  Service: Gynecology;;  . Excision haglund's deformity with achilles tendon repair Bilateral 2013-2014  . Melanoma excision Left 2013    "behind knee"   . Cystoscopy with stent placement  2006    URETERAL  . Appendectomy  ~ 1992  . Tonsillectomy and adenoidectomy  ~ 1989  . Laparoscopy adhesiolysis and removal possible right ovary remnant  04/18/2000  . Knee arthroscopy Right 08/16/2000  . Carpal tunnel release Right 07/26/2001  . Debridement right thumb mp joint Right 04/10/2002  . Right thumb fusion of mpj Right 10/03/2002  . Decompression left ulnar nerve, elbow Left 01/29/2003  . Right total knee revision arthroplasty Right 03/14/2007  . Cysto/ urethral dilation/ hydrodistention/ bladder bx  04/10/2003  . Laparoscopic assisted vaginal hysterectomy  Feb 1999  . Laparoscopy lysis adhesions/  bilateral salpingoophorectomy/  fulgeration of endometriosis  1998  . Anterior cervical decomp/discectomy fusion  03/10/2009    C5-6, C6-7  . Esophagogastroduodenoscopy (egd) with esophageal dilation  10-26-2000  . Cysto/  bladder bx  03/08/2008  . Transthoracic echocardiogram  09/22/2012  . Cysto with hydrodistension N/A 05/08/2014    Procedure: CYSTOSCOPY/HYDRODISTENSION WITH BILATERAL RETROGRADES;  Surgeon: Ardis Hughs, MD;  Location: [facility];  Service: Urology;  Laterality: N/A;  . Laparoscopy  02/11/2012    Procedure: LAPAROSCOPY DIAGNOSTIC;  Surgeon: Cheri Fowler, MD;  Location: [facility];  Service:  Gynecology;  Laterality: N/A;  Diagnostic  Operative Laparoscopic   . Transanal hemorrhoidal dearterialization  03/26/2011  . Knee arthroscopy with fulkerson slide Right 11/29/2000  . Knee arthroscopy w/ lateral release Right 11/29/2000    states 13 surgeries on right knee  . Laparoscopic cholecystectomy  03/04/2002  . Total knee arthroplasty Right 09/10/2003; 12/08/2012  . Carpal tunnel release Left   . Melanoma excision      scalp  . Augmentation mammaplasty Bilateral 1996; 2001  . Breast surgery  1980's    accessory breast tissue exc.  . Carpometacarpel suspension plasty Right 02/10/2015    Procedure: RIGHT THUMB SUSPENSION  ARTHROPLASTY;  Surgeon: Milly Jakob, MD;  Location: [place];  Service: Orthopedics;  Laterality: Right;  ANESTHESIA: PRE- OP BLOCK  . Ganglion cyst excision Right 02/10/2015    Procedure: REMOVAL GANGLION OF WRIST, PARTIAL ULNAR EXCISION;  Surgeon: Milly Jakob, MD;  Location: [place];  Service: Orthopedics;  Laterality: Right;  . Negative sleep study  2006 approx .  per pt  . Cysto with hydrodistension N/A 03/27/2015    Procedure: CYSTOSCOPY/HYDRODISTENSION OF BLADDER, INSTILLATION OF MARCAINE/PYRIDIUM;  Surgeon: Ardis Hughs, MD;  Location: [facility];  Service: Urology;  Laterality: N/A;  . Hand surgery Right 01-31-15   Family History  Problem Relation Age of Onset  . Anesthesia problems Mother   . Cancer Mother     ovarian  . Cancer Daughter     cervical  . Cancer Maternal Aunt     breast  . Leukemia Mother    Social History  Substance Use Topics  . Smoking status: Former Smoker    Quit date: 02/22/2006  . Smokeless tobacco: Never Used  . Alcohol Use: 0.0 oz/week    0 Standard drinks or equivalent per week     Comment: rarely   OB History    Gravida Para Term Preterm AB TAB SAB Ectopic Multiple Living   0 0 0 0 0 0 0 0       Review of Systems ROS: Statement: All systems negative except as  marked or noted in the HPI; Constitutional: Negative for fever and chills. ; ; Eyes: Negative for eye pain, redness and discharge. ; ; ENMT: Negative for ear pain, hoarseness, nasal congestion, sinus pressure and sore throat. ; ; Cardiovascular: Negative for chest pain, palpitations, diaphoresis, dyspnea and peripheral edema. ; ; Respiratory: Negative for cough, wheezing and stridor. ; ; Gastrointestinal: +abd pain. Negative for nausea, vomiting, diarrhea, blood in stool, hematemesis, jaundice and rectal bleeding. . ; ; Genitourinary: Negative for dysuria, +hematuria. ; ; Musculoskeletal: +LBP. Negative for neck pain. Negative for swelling and trauma.; ; Skin: Negative for pruritus, rash, abrasions, blisters, bruising and skin lesion.; ; Neuro: Negative for headache, lightheadedness and neck stiffness. Negative for weakness, altered level of consciousness , altered mental status,  extremity weakness, paresthesias, involuntary movement, seizure and syncope.      Allergies  Red dye; Chloraprep one step; Cobalt; Corticosteroids; Eggs or egg-derived products; Latex; Morphine and related; Nickel; Other; Peanuts; Supartz; Surgical lubricant; Tape; Yellow dyes (non-tartrazine); Azithromycin; Flexeril; Percocet; and Soap  Home Medications   Prior to Admission medications   Medication Sig Start Date End Date Taking? Authorizing Provider  albuterol (PROVENTIL) (2.5 MG/3ML) 0.083% nebulizer solution Take 3 mLs (2.5 mg total) by nebulization every 4 (four) hours as needed for wheezing (or coughing spells). 03/25/15   Adelina Mings, MD  ALPRAZolam Duanne Moron) 0.5 MG tablet Take 0.5 mg by mouth 3 (three) times daily as needed. Anxiety    Historical Provider, MD  cyanocobalamin (,VITAMIN B-12,) 1000 MCG/ML injection Inject 1,000 mcg into the muscle once a week. Reported on 03/25/2015    Historical Provider, MD  diphenhydrAMINE (BENADRYL) 50 MG tablet Take 50 mg by mouth every 6 (six) hours as needed (prn , alternates  with hydroxyzine).     Historical Provider, MD  EPINEPHrine (EPIPEN 2-PAK) 0.3 mg/0.3 mL IJ SOAJ injection Inject 0.3 mLs (0.3 mg total) into the muscle as needed (in the event of a severe life-threatening reaction). 12/09/14   Charlies Silvers, MD  estradiol (ESTRACE) 0.5 MG tablet Take 0.5 mg by mouth at bedtime.     Historical Provider, MD  EVZIO 0.4 MG/0.4ML SOAJ AUTO INJECT AS NEEDED 11/14/14   Historical Provider, MD  fluticasone (FLONASE) 50 MCG/ACT nasal spray Place 2 sprays into both nostrils daily. 12/09/14   Charlies Silvers, MD  fluticasone-salmeterol (ADVAIR HFA) 230-21 MCG/ACT inhaler Inhale 1 puff into the lungs 2 (two) times daily.    Historical Provider, MD  gabapentin (NEURONTIN) 300 MG capsule Take 1 capsule (300 mg total) by mouth 2 (two) times daily. 05/24/15   Stevi Barrett, PA-C  ketoconazole (NIZORAL) 2 % shampoo APPLICATIONS APPLY ON THE SKIN [place] HAIR 2-3 TIMES WEEKLY 11/19/14   Historical Provider, MD  levothyroxine (SYNTHROID, LEVOTHROID) 50 MCG tablet Take 50 mcg by mouth 2 (two) times daily. Pt can only take name brand    Historical Provider, MD  mometasone-formoterol (DULERA) 200-5 MCG/ACT AERO INHALE TWO PUFFS TWICE DAILY TO PREVENT COUGH OR WHEEZE. RINSE, GARGLE, AND SPIT AFTER USE. USE WITH SPACER. 03/25/15   Adelina Mings, MD  promethazine (PHENERGAN) 25 MG tablet Take 25 mg by mouth every 4 (four) hours as needed.     Historical Provider, MD  topiramate (TOPAMAX) 50 MG tablet Take 50 mg by mouth 2 (two) times daily.     Historical Provider, MD  UNABLE TO FIND once a week. Allergy injections - once  a week    Historical Provider, MD  Zoledronic Acid (RECLAST IV) Inject into the vein as directed. Yearly --  Last dose Oct 2015    Historical Provider, MD   BP 125/85 mmHg  Pulse 86  Temp(Src) 97.8 F (36.6 C) (Oral)  Resp 16  SpO2 98% Physical Exam  1125: Physical examination:  Nursing notes reviewed; Vital signs and O2 SAT reviewed;  Constitutional: Well  developed, Well nourished, Well hydrated, In no acute distress; Head:  Normocephalic, atraumatic; Eyes: EOMI, PERRL, No scleral icterus; ENMT: Mouth and pharynx normal, Mucous membranes moist; Neck: Supple, Full range of motion, No lymphadenopathy; Cardiovascular: Regular rate and rhythm, No gallop; Respiratory: Breath sounds clear & equal bilaterally, No wheezes.  Speaking full sentences with ease, Normal respiratory effort/excursion; Chest: Nontender, Movement normal; Abdomen: Soft, +suprapubic tenderness to palp.  No rebound or guarding. Nondistended, Normal bowel sounds; Genitourinary: No CVA tenderness; Spine:  No midline CS, TS, LS tenderness.; Extremities: Pulses normal, No tenderness, No edema, No calf edema or asymmetry.; Neuro: AA&Ox3, Major CN grossly intact.  Speech clear. No gross focal motor or sensory deficits in extremities.; Skin: Color normal, Warm, Dry.   ED Course  Procedures (including critical care time) Labs Review   Imaging Review  I have personally reviewed and evaluated these images and lab results as part of my medical decision-making.   EKG Interpretation None      MDM  MDM Reviewed: previous chart, nursing note and vitals Reviewed previous: labs Interpretation: labs and CT scan     Results for orders placed or performed during the hospital encounter of 06/06/15  Lipase, blood  Result Value Ref Range   Lipase 28 11 - 51 U/L  Comprehensive metabolic panel  Result Value Ref Range   Sodium 142 135 - 145 mmol/L   Potassium 3.9 3.5 - 5.1 mmol/L   Chloride 108 101 - 111 mmol/L   CO2 23 22 - 32 mmol/L   Glucose, Bld 103 (H) 65 - 99 mg/dL   BUN 26 (H) 6 - 20 mg/dL   Creatinine, Ser 1.07 (H) 0.44 - 1.00 mg/dL   Calcium 9.2 8.9 - 10.3 mg/dL   Total Protein 6.9 6.5 - 8.1 g/dL   Albumin 3.8 3.5 - 5.0 g/dL   AST 24 15 - 41 U/L   ALT 31 14 - 54 U/L   Alkaline Phosphatase 67 38 - 126 U/L   Total Bilirubin 0.6 0.3 - 1.2 mg/dL   GFR calc non Af Amer >60 >60  mL/min   GFR calc Af Amer >60 >60 mL/min   Anion gap 11 5 - 15  CBC  Result Value Ref Range   WBC 7.6 4.0 - 10.5 K/uL   RBC 4.35 3.87 - 5.11 MIL/uL   Hemoglobin 12.4 12.0 - 15.0 g/dL   HCT 36.8 36.0 - 46.0 %   MCV 84.6 78.0 - 100.0 fL   MCH 28.5 26.0 - 34.0 pg   MCHC 33.7 30.0 - 36.0 g/dL   RDW 13.6 11.5 - 15.5 %   Platelets 343 150 - 400 K/uL  Urinalysis, Routine w reflex microscopic (not at [facility])  Result Value Ref Range   Color, Urine YELLOW YELLOW   APPearance CLOUDY (A) CLEAR   Specific Gravity, Urine 1.018 1.005 - 1.030   pH 6.5 5.0 - 8.0   Glucose, UA NEGATIVE NEGATIVE mg/dL   Hgb urine dipstick TRACE (A) NEGATIVE   Bilirubin Urine NEGATIVE NEGATIVE   Ketones, ur NEGATIVE NEGATIVE mg/dL   Protein, ur NEGATIVE NEGATIVE mg/dL   Nitrite NEGATIVE NEGATIVE   Leukocytes, UA TRACE (A) NEGATIVE  Urine microscopic-add on  Result Value Ref Range   Squamous Epithelial / LPF TOO NUMEROUS TO COUNT (A) NONE SEEN   WBC, UA 0-5 0 - 5 WBC/hpf   RBC / HPF 6-30 0 - 5 RBC/hpf   Bacteria, UA MANY (A) NONE SEEN   Urine-Other AMORPHOUS URATES/PHOSPHATES   I-Stat beta hCG blood, ED ([facility], [facility], [facility] only)  Result Value Ref Range   I-stat hCG, quantitative 6.0 (H) <5 mIU/mL   Comment 3           Ct Abdomen Pelvis Wo Contrast 06/06/2015  CLINICAL DATA:  Lower abdominal pain. Bilateral flank pain. Bilateral lower extremity pain for 1 year. Intermittent hematuria for the past 2 years. Bladder distention procedure  approximately 4 months ago. The patient is concerned about the possibility of bladder cancer. EXAM: CT ABDOMEN AND PELVIS WITHOUT CONTRAST TECHNIQUE: Multidetector CT imaging of the abdomen and pelvis was performed following the standard protocol without IV contrast. COMPARISON:  03/14/2015. FINDINGS: Lower chest: Bilateral breast implants. Linear atelectasis and scarring at both lung bases. Hepatobiliary: Unremarkable liver.  Cholecystectomy clips. Pancreas: No mass or inflammatory process  identified on this un-enhanced exam. Spleen: Within normal limits in size and appearance. Adrenals/Urinary Tract: 4 mm upper pole right renal calculus. Otherwise, normal appearing kidneys, ureters and urinary bladder. No visible bladder mass. Normal appearing adrenal glands. Stomach/Bowel: Prominent stool throughout the majority of the colon. No gastric or small bowel abnormalities. Surgically absent appendix. Vascular/Lymphatic: No pathologically enlarged lymph nodes. No evidence of abdominal aortic aneurysm. Reproductive: Surgically absent uterus.  No adnexal masses. Other: None. Musculoskeletal:  Mild thoracic spine degenerative changes IMPRESSION: 1. 4 mm nonobstructing upper pole right renal calculus. 2. Prominent stool throughout the majority of the colon. 3. No acute abnormality. Electronically Signed   By: Claudie Revering M.D.   On: 06/06/2015 14:12    1430:  Udip appears contaminated; UC is pending. IVF given for mildly elevated BUN/Cr. CT scan is reassuring. Pt states she is ready to go home now. Dx and testing d/w pt.  Questions answered.  Verb understanding, agreeable to d/c home with outpt f/u with her Uro MD.   Francine Graven, DO 06/08/15 1423

## 2015-06-06 NOTE — Discharge Instructions (Signed)
Take your usual prescriptions as previously directed. Your urine did not show a definite infection today, a urine culture was obtained and the results are pending.  Apply moist heat or ice to the area(s) of discomfort, for 15 minutes at a time, several times per day for the next few days.  Do not fall asleep on a heating or ice pack.  Call your Urologist on Monday to schedule a follow up appointment in the next 3 days.  Return to the Emergency Department immediately if worsening.

## 2015-06-06 NOTE — ED Notes (Signed)
Pt c/o lower abdominal pain, bilateral flank pain, and BLE pain x 1 year and intermittent hematuria x 2 years.  Pain score 8/10.  Pt reports that she had bladder stretched x "maybe 4 months ago."  Pt reports taking oxycodone w/o relief.  Pt sts "we need to r/o bladder CA."  Sts she is followed by Louis Meckel MD and he directed her to come in.

## 2015-06-06 NOTE — ED Notes (Signed)
MD at bedside. DELAY IN LABS

## 2015-06-07 ENCOUNTER — Other Ambulatory Visit: Payer: Self-pay | Admitting: Pediatrics

## 2015-06-10 DIAGNOSIS — Z0000 Encounter for general adult medical examination without abnormal findings: Secondary | ICD-10-CM | POA: Diagnosis not present

## 2015-06-10 DIAGNOSIS — R310 Gross hematuria: Secondary | ICD-10-CM | POA: Diagnosis not present

## 2015-06-10 DIAGNOSIS — N3010 Interstitial cystitis (chronic) without hematuria: Secondary | ICD-10-CM | POA: Diagnosis not present

## 2015-06-11 DIAGNOSIS — R319 Hematuria, unspecified: Secondary | ICD-10-CM | POA: Diagnosis not present

## 2015-06-12 DIAGNOSIS — R319 Hematuria, unspecified: Secondary | ICD-10-CM | POA: Diagnosis not present

## 2015-06-16 ENCOUNTER — Ambulatory Visit (INDEPENDENT_AMBULATORY_CARE_PROVIDER_SITE_OTHER): Payer: BLUE CROSS/BLUE SHIELD | Admitting: Neurology

## 2015-06-16 DIAGNOSIS — G43709 Chronic migraine without aura, not intractable, without status migrainosus: Secondary | ICD-10-CM | POA: Diagnosis not present

## 2015-06-16 DIAGNOSIS — IMO0002 Reserved for concepts with insufficient information to code with codable children: Secondary | ICD-10-CM

## 2015-06-16 MED ORDER — SUMATRIPTAN SUCCINATE 100 MG PO TABS: ORAL_TABLET | ORAL | Status: DC

## 2015-06-16 MED ORDER — ONABOTULINUMTOXINA 100 UNITS IJ SOLR
130.0000 [IU] | Freq: Once | INTRAMUSCULAR | Status: AC
  Administered 2015-06-16: 130 [IU] via INTRAMUSCULAR

## 2015-06-16 MED ORDER — KETOROLAC TROMETHAMINE 60 MG/2ML IM SOLN
60.0000 mg | Freq: Once | INTRAMUSCULAR | Status: AC
  Administered 2015-06-16: 60 mg via INTRAMUSCULAR

## 2015-06-16 NOTE — Progress Notes (Signed)
Please see Botox procedure note.  Frequency of migraines are down to 2 to 3 headache days per week. She has a headache now since last night, so will give her Toradol 60mg  injection today. Will prescribe her sumatriptan 100mg  for abortive therapy. Due to recent basal cell carcinoma removal in left suboccipital/upper cervical region, will not perform injections in the left occipitalis and upper cervical paraspinals this round.

## 2015-06-16 NOTE — Procedures (Signed)
[facility]   Procedure Note Botox  Attending: Metta Clines, DO  Preoperative Diagnosis(es): Chronic migraine  Consent obtained from: The patient Benefits discussed included, but were not limited to decreased muscle tightness, increased joint range of motion, and decreased pain.  Risk discussed included, but were not limited pain and discomfort, bleeding, bruising, excessive weakness, venous thrombosis, muscle atrophy and dysphagia.  Anticipated outcomes of the procedure as well as he risks and benefits of the alternatives to the procedure, and the roles and tasks of the personnel to be involved, were discussed with the patient, and the patient consents to the procedure and agrees to proceed. A copy of the patient medication guide was given to the patient which explains the blackbox warning.  Patients identity and treatment sites confirmed Yes.  .  Details of Procedure: Skin was cleaned with alcohol. Prior to injection, the needle plunger was aspirated to make sure the needle was not within a blood vessel.  There was no blood retrieved on aspiration.    Following is a summary of the muscles injected  And the amount of Botulinum toxin used:  Dilution 200 units of Botox was reconstituted with 4 ml of preservative free normal saline. Time of reconstitution: At the time of the office visit (<30 minutes prior to injection)   Injections  Due to recent basal cell carcinoma removal in the left suboccipital region, I did not perform injections in the left occipitalis and upper cervical region this round.  130 total units of Botox was injected with a 30 gauge needle.  Injection Sites: L occipitalis: 0 units- 3 sites  R occiptalis: 15 units- 3 sites  L upper trapezius: 15 units- 3 sites R upper trapezius: 15 units- 3 sits          L paraspinal: 0 units- 2 sites R paraspinal: 10 units- 2 sites  Face L frontalis(2 injection sites):10 units   R frontalis(2 injection sites):10 units          L corrugator: 5 units   R corrugator: 5 units           Procerus: 5 units   L temporalis: 20 units R temporalis: 20 units   Agent:  200 units of botulinum Type A (Onobotulinum Toxin type A) was reconstituted with 4 ml of preservative free normal saline.  Time of reconstitution: At the time of the office visit (<30 minutes prior to injection)     Total injected (Units): 130   Total wasted (Units): 70  Patient tolerated procedure well without complications.   Reinjection is anticipated in 3 months. Return to clinic in 4.5 months.  Adam R. Tomi Likens, DO

## 2015-06-16 NOTE — Patient Instructions (Addendum)
1.  Take sumatriptan 100mg  at the earliest onset of the headache.  May repeat dose once in 2 hours if needed.  Do not exceed 2 tablets in 24 hours.    2.  Follow up in 3 months for next round of botox 3.  Follow up in 4 and 1/2 months for office visit. 4.  We will give you a 60mg  Toradol shot in the office to try and break this current headache.

## 2015-06-17 ENCOUNTER — Ambulatory Visit: Payer: Self-pay | Admitting: Pain Medicine

## 2015-06-17 DIAGNOSIS — M5412 Radiculopathy, cervical region: Secondary | ICD-10-CM | POA: Diagnosis not present

## 2015-06-19 ENCOUNTER — Ambulatory Visit (INDEPENDENT_AMBULATORY_CARE_PROVIDER_SITE_OTHER): Payer: BLUE CROSS/BLUE SHIELD

## 2015-06-19 DIAGNOSIS — J309 Allergic rhinitis, unspecified: Secondary | ICD-10-CM | POA: Diagnosis not present

## 2015-06-23 ENCOUNTER — Telehealth: Payer: Self-pay | Admitting: Pain Medicine

## 2015-06-23 ENCOUNTER — Ambulatory Visit (INDEPENDENT_AMBULATORY_CARE_PROVIDER_SITE_OTHER): Payer: BLUE CROSS/BLUE SHIELD

## 2015-06-23 DIAGNOSIS — G894 Chronic pain syndrome: Secondary | ICD-10-CM | POA: Diagnosis not present

## 2015-06-23 DIAGNOSIS — M25551 Pain in right hip: Secondary | ICD-10-CM | POA: Diagnosis not present

## 2015-06-23 DIAGNOSIS — Z79891 Long term (current) use of opiate analgesic: Secondary | ICD-10-CM | POA: Diagnosis not present

## 2015-06-23 DIAGNOSIS — G43909 Migraine, unspecified, not intractable, without status migrainosus: Secondary | ICD-10-CM | POA: Diagnosis not present

## 2015-06-23 DIAGNOSIS — G43109 Migraine with aura, not intractable, without status migrainosus: Secondary | ICD-10-CM | POA: Diagnosis not present

## 2015-06-23 DIAGNOSIS — M542 Cervicalgia: Secondary | ICD-10-CM | POA: Diagnosis not present

## 2015-06-23 MED ORDER — DIPHENHYDRAMINE HCL 50 MG/ML IJ SOLN
25.0000 mg | Freq: Once | INTRAMUSCULAR | Status: AC
  Administered 2015-06-23: 25 mg via INTRAVENOUS

## 2015-06-23 MED ORDER — KETOROLAC TROMETHAMINE 60 MG/2ML IM SOLN
60.0000 mg | Freq: Once | INTRAMUSCULAR | Status: AC
  Administered 2015-06-23: 60 mg via INTRAMUSCULAR

## 2015-06-23 MED ORDER — DEXTROSE 5 % IV SOLN
10.0000 mg | Freq: Once | INTRAVENOUS | Status: AC
  Administered 2015-06-23: 10 mg via INTRAMUSCULAR

## 2015-06-23 NOTE — Telephone Encounter (Signed)
Kari Green from [facility] called stating patient has also been coming to see them since 2012, she would like to speak with nurse about what scripts patient got, Nurse please call

## 2015-06-23 NOTE — Telephone Encounter (Signed)
Spoke with Theadora Rama at [place] [facility].  She states patient was there today.  She has not been getting narcotics from them recently because she had been in trouble with them.  Patient was given a note to give [place] stating that we would not prescribe pain medications and would only do procedures.  Theadora Rama states that patient was 7 days short with our meds today as well.

## 2015-06-25 ENCOUNTER — Other Ambulatory Visit: Payer: Self-pay | Admitting: Orthopedic Surgery

## 2015-06-25 DIAGNOSIS — R8290 Unspecified abnormal findings in urine: Secondary | ICD-10-CM | POA: Diagnosis not present

## 2015-06-25 DIAGNOSIS — R319 Hematuria, unspecified: Secondary | ICD-10-CM | POA: Diagnosis not present

## 2015-07-01 ENCOUNTER — Other Ambulatory Visit: Payer: Self-pay | Admitting: Orthopedic Surgery

## 2015-07-05 NOTE — Pre-Procedure Instructions (Addendum)
    Kari Green  07/05/2015     Your procedure is scheduled on May 24  Report to [facility] Admitting at 8:50 A.M.  Call this number if you have problems the morning of surgery:  [phone]   Remember:  Do not eat food or drink liquids after midnight.   Take these medicines the morning of surgery with A SIP OF WATER Albuterol inhaler if needed (please bring with you), Alprazolam (if needed), Benadryl OR Hydroxyzine (if needed), Flonase,  Levothyroxine, Dulera, Promethazine (if needed), Propranolol, Topamax, Dilaudid if needed   STOP Multiple Vitamins, Magnesium, Aspirin, Vitamin B12 Wednesday May 17   STOP/ Do not take Aspirin, Aleve, Naproxen, Advil, Ibuprofen, Motrin, Vitamins, Herbs, or Supplements starting May 17    Do not wear jewelry, make-up or nail polish.  Do not wear lotions, powders, or perfumes.  You maynot wear deodorant.  Do not shave 48 hours prior to surgery.    Do not bring valuables to the hospital.   [facility] is not responsible for any belongings or valuables.  Contacts, dentures or bridgework may not be worn into surgery.  Leave your suitcase in the car.  After surgery it may be brought to your room.  For patients admitted to the hospital, discharge time will be determined by your treatment team.  Patients discharged the day of surgery will not be allowed to drive home.

## 2015-07-07 ENCOUNTER — Encounter (HOSPITAL_COMMUNITY): Payer: Self-pay

## 2015-07-07 ENCOUNTER — Encounter (HOSPITAL_COMMUNITY)
Admission: RE | Admit: 2015-07-07 | Discharge: 2015-07-07 | Disposition: A | Discharge status: Home / Self Care | Payer: BLUE CROSS/BLUE SHIELD | Source: Ambulatory Visit | Attending: Orthopedic Surgery | Admitting: Orthopedic Surgery

## 2015-07-07 DIAGNOSIS — I10 Essential (primary) hypertension: Secondary | ICD-10-CM | POA: Diagnosis not present

## 2015-07-07 DIAGNOSIS — Z87891 Personal history of nicotine dependence: Secondary | ICD-10-CM | POA: Insufficient documentation

## 2015-07-07 DIAGNOSIS — Z01818 Encounter for other preprocedural examination: Secondary | ICD-10-CM | POA: Diagnosis not present

## 2015-07-07 DIAGNOSIS — M79602 Pain in left arm: Secondary | ICD-10-CM | POA: Diagnosis not present

## 2015-07-07 DIAGNOSIS — Z79899 Other long term (current) drug therapy: Secondary | ICD-10-CM | POA: Diagnosis not present

## 2015-07-07 DIAGNOSIS — E039 Hypothyroidism, unspecified: Secondary | ICD-10-CM | POA: Insufficient documentation

## 2015-07-07 DIAGNOSIS — J45909 Unspecified asthma, uncomplicated: Secondary | ICD-10-CM | POA: Diagnosis not present

## 2015-07-07 DIAGNOSIS — Z01812 Encounter for preprocedural laboratory examination: Secondary | ICD-10-CM | POA: Diagnosis not present

## 2015-07-07 HISTORY — DX: Other amnesia: R41.3

## 2015-07-07 HISTORY — DX: Other complications of anesthesia, initial encounter: T88.59XA

## 2015-07-07 HISTORY — DX: Adverse effect of unspecified anesthetic, initial encounter: T41.45XA

## 2015-07-07 HISTORY — DX: Family history of other specified conditions: Z84.89

## 2015-07-07 HISTORY — DX: Essential (primary) hypertension: I10

## 2015-07-07 LAB — URINALYSIS, ROUTINE W REFLEX MICROSCOPIC
Bilirubin Urine: NEGATIVE
GLUCOSE, UA: NEGATIVE mg/dL
Hgb urine dipstick: NEGATIVE
KETONES UR: NEGATIVE mg/dL
LEUKOCYTES UA: NEGATIVE
Nitrite: NEGATIVE
PH: 6 (ref 5.0–8.0)
Protein, ur: NEGATIVE mg/dL
SPECIFIC GRAVITY, URINE: 1.015 (ref 1.005–1.030)

## 2015-07-07 LAB — SURGICAL PCR SCREEN
MRSA, PCR: NEGATIVE
STAPHYLOCOCCUS AUREUS: POSITIVE — AB

## 2015-07-07 LAB — NO BLOOD PRODUCTS

## 2015-07-07 NOTE — Progress Notes (Signed)
Unable to obtain blood work at [employer] appointment.  Will need to be drawn DOS.

## 2015-07-07 NOTE — Progress Notes (Addendum)
PCP - Dr. Thressa Sheller Cardiologist - Dr. Percival Spanish but denies seeing on a regular basis.  Neurologist - Dr. Gorden Harms  EKG - 05/08/15 CXR - 07/07/15  Echo - 09/2012 Stress test - 05/08/2015  Patient denies chest pain and shortness of breath at PAT appointment.    Patient refuses all blood products.  Blood refusal faxed to lab 07/07/15.

## 2015-07-07 NOTE — Progress Notes (Signed)
Mupirocin Rx called into [facility] in [facility] for positive PCR of staph. Pt notified and voiced understanding.

## 2015-07-07 NOTE — Progress Notes (Signed)
Per patient:   -NO IV's in right wrist or hand due to recent surgery -Right arm blood pressure can be done -No LEFT arm blood pressure but can do IV on left arm if tourniquet is below scar on posterior left forearm/elbow   Patient is difficult stick.

## 2015-07-08 ENCOUNTER — Telehealth: Payer: Self-pay | Admitting: Pain Medicine

## 2015-07-08 DIAGNOSIS — Z79891 Long term (current) use of opiate analgesic: Secondary | ICD-10-CM | POA: Diagnosis not present

## 2015-07-08 DIAGNOSIS — G894 Chronic pain syndrome: Secondary | ICD-10-CM | POA: Diagnosis not present

## 2015-07-08 DIAGNOSIS — M25551 Pain in right hip: Secondary | ICD-10-CM | POA: Diagnosis not present

## 2015-07-08 DIAGNOSIS — M542 Cervicalgia: Secondary | ICD-10-CM | POA: Diagnosis not present

## 2015-07-08 DIAGNOSIS — G43109 Migraine with aura, not intractable, without status migrainosus: Secondary | ICD-10-CM | POA: Diagnosis not present

## 2015-07-08 NOTE — Progress Notes (Signed)
Anesthesia Chart Review:  Pt is a 50 year old female scheduled for C4-5 ACDF on 07/16/2015 with Dr. Lynann Bologna.   Pt will not accept blood products.   Per patient:  -NO IV's in right wrist or hand due to recent surgery -Right arm blood pressure can be done -No LEFT arm blood pressure but can do IV on left arm if tourniquet is below scar on posterior left forearm/elbow   PCP is Dr. Thressa Sheller. Cardiologist is Dr. Minus Breeding.   PMH includes:  HTN, asthma, hypothyroidism, post-op N/V. Former smoker. BMI 23  Medications include: albuterol, levothyroxine, dulera, propranolol  Labs will be obtained DOS.   Chest x-ray 07/07/15 reviewed. No acute findings.   EKG 05/08/15: NSR.   Exercise tolerance test 05/08/15: Negative stress test without evidence of ischemia at given workload.  Echo 09/22/12:  - Left ventricle: The cavity size was normal. Wall thickness was normal. Systolic function was normal. The estimated ejection fraction was in the range of 55% to 60%. Wall motion was normal; there were no regional wall motion abnormalities. Left ventricular diastolic function parameters were normal. - Atrial septum: No defect or patent foramen ovale was identified. - Pericardium, extracardiac: A trivial pericardial effusion was identified posterior to the heart.  If labs acceptable DOS, I anticipate pt can proceed with surgery as scheduled.   Willeen Cass, FNP-BC [facility]/Anesthesiology Phone: [phone] 07/08/2015 3:23 PM

## 2015-07-08 NOTE — Telephone Encounter (Signed)
Kari Green has looked at this.

## 2015-07-08 NOTE — Telephone Encounter (Signed)
Needs a physician note stating Dr. Dossie Arbour will not be writing her any scripts for pain meds, please fax to [phone]

## 2015-07-11 ENCOUNTER — Other Ambulatory Visit: Payer: Self-pay | Admitting: Orthopedic Surgery

## 2015-07-14 NOTE — H&P (Signed)
PREOPERATIVE H&P  Chief Complaint: L arm pain  HPI: Kari Green is a 50 y.o. female who presents with ongoing pain in the left arm x 1 year. 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 Anxiety   . IC (interstitial cystitis)   . Osteoporosis   . Irritable bowel syndrome (IBS)   . History of hiatal hernia   . Migraines   . Hypothyroidism (acquired)   . History of kidney stones 04/2014  . Pain syndrome, chronic   . Arthritis     right thumb  . Degenerative disc disease, cervical   . Degenerative disc disease, lumbar   . Chronic, continuous use of opioids   . Seasonal allergies   . Sciatica     bilateral  . Fibromyalgia   . Dental crowns present   . Difficulty swallowing pills   . Difficult intravenous access   . Delayed gastric emptying   . Esophageal dysmotility   . Hemangioma, renal   . Hemangioma of liver   . Limited joint range of motion     right knee - unable to fully extend right leg  . Knee pain, right     states gets "nerves burned" periodically  . Cognitive dysfunction     patient denies  . Pain management   . Kidney stone   . Family history of adverse reaction to anesthesia     family - itching, hives, PONV  . PONV (postoperative nausea and vomiting)     uncontrollable itching with anesthesia; hx. of anesthesia awareness  . PONV (postoperative nausea and vomiting)     "need medication before and need medication afterwards"  . Complication of anesthesia     "not asleep and aware of everything"  . Memory impairment     due to pain   . History of pneumonia   . Hypertension   . Asthma   . History of bronchitis   . Cancer [facility])     history of skin cancer   Past Surgical History    Procedure Laterality Date  . Total thyroidectomy  1993  . Cysto with hydrodistension  01/29/2011    Procedure: CYSTOSCOPY/HYDRODISTENSION;  Surgeon: Claybon Jabs, MD;  Location: [facility];  Service: Urology;  Laterality: N/A;  . Cystoscopy w/ retrogrades  01/29/2011    Procedure: CYSTOSCOPY WITH RETROGRADE PYELOGRAM;  Surgeon: Claybon Jabs, MD;  Location[facility];  Service: Urology;  Laterality: Bilateral;  . Fissurectomy  ~ 1988 x2  . Refractive surgery Bilateral ~ 1999; ~ 2013  . Nasal sinus surgery  2013  . Laparoscopic lysis of adhesions  02/11/2012    Procedure: LAPAROSCOPIC LYSIS OF ADHESIONS;  Surgeon: Cheri Fowler, MD;  Location: [facility];  Service: Gynecology;;  . Excision haglund's deformity with achilles tendon repair Bilateral 2013-2014  . Melanoma excision Left 2013    "behind knee"   . Cystoscopy with stent placement  2006    URETERAL  . Appendectomy  ~ 1992  . Tonsillectomy and adenoidectomy  ~ 1989  . Laparoscopy adhesiolysis and removal possible right ovary remnant  04/18/2000  . Knee arthroscopy Right 08/16/2000  . Carpal tunnel release Right 07/26/2001  . Debridement right  thumb mp joint Right 04/10/2002  . Right thumb fusion of mpj Right 10/03/2002  . Decompression left ulnar nerve, elbow Left 01/29/2003  . Right total knee revision arthroplasty Right 03/14/2007  . Cysto/ urethral dilation/ hydrodistention/ bladder bx  04/10/2003  . Laparoscopic assisted vaginal hysterectomy  Feb 1999  . Laparoscopy lysis adhesions/  bilateral salpingoophorectomy/  fulgeration of endometriosis  1998  . Anterior cervical decomp/discectomy fusion  03/10/2009    C5-6, C6-7  . Esophagogastroduodenoscopy (egd) with esophageal dilation  10-26-2000  . Cysto/  bladder bx  03/08/2008  . Transthoracic echocardiogram  09/22/2012  . Cysto with hydrodistension N/A 05/08/2014    Procedure: CYSTOSCOPY/HYDRODISTENSION WITH BILATERAL RETROGRADES;  Surgeon: Ardis Hughs, MD;  Location: [facility];  Service: Urology;  Laterality: N/A;  . Laparoscopy  02/11/2012    Procedure: LAPAROSCOPY DIAGNOSTIC;  Surgeon: Cheri Fowler, MD;  Location: [facility];  Service: Gynecology;  Laterality: N/A;  Diagnostic  Operative Laparoscopic   . Transanal hemorrhoidal dearterialization  03/26/2011  . Knee arthroscopy with fulkerson slide Right 11/29/2000  . Knee arthroscopy w/ lateral release Right 11/29/2000    states 13 surgeries on right knee  . Laparoscopic cholecystectomy  03/04/2002  . Total knee arthroplasty Right 09/10/2003; 12/08/2012    x3  . Carpal tunnel release Left   . Melanoma excision      scalp  . Carpometacarpel suspension plasty Right 02/10/2015    Procedure: RIGHT THUMB SUSPENSION  ARTHROPLASTY;  Surgeon: Milly Jakob, MD;  Location: [place];  Service: Orthopedics;  Laterality: Right;  ANESTHESIA: PRE- OP BLOCK  . Ganglion cyst excision Right 02/10/2015    Procedure: REMOVAL GANGLION OF WRIST, PARTIAL ULNAR EXCISION;  Surgeon: Milly Jakob, MD;  Location: [place];  Service: Orthopedics;  Laterality: Right;  . Negative sleep study  2006 approx .  per pt  . Cysto with hydrodistension N/A 03/27/2015    Procedure: CYSTOSCOPY/HYDRODISTENSION OF BLADDER, INSTILLATION OF MARCAINE/PYRIDIUM;  Surgeon: Ardis Hughs, MD;  Location: [facility];  Service: Urology;  Laterality: N/A;  . Hand surgery Right 01-31-15  . Abdominal hysterectomy    . Augmentation mammaplasty Bilateral 1996; 2001, 2016  . Breast surgery  1980's    accessory breast tissue exc.  Marland Kitchen Hemorroidectomy    . Kidney stone surgery    . Eye surgery Bilateral     x4  . Shoulder surgery Left    Social History   Social History  . Marital Status: Married    Spouse Name: N/A  . Number of Children: N/A  . Years of Education: N/A   Social History Main Topics  . Smoking status: Former Smoker    Quit date: 02/22/2006  .  Smokeless tobacco: Never Used  . Alcohol Use: 0.0 oz/week    0 Standard drinks or equivalent per week     Comment: rarely  . Drug Use: No  . Sexual Activity: Not on file   Other Topics Concern  . Not on file   Social History Narrative   Lives with husband.     Family History  Problem Relation Age of Onset  . Anesthesia problems Mother   . Cancer Mother     ovarian  . Cancer Daughter     cervical  . Cancer Maternal Aunt     breast  . Leukemia Mother    Allergies  Allergen Reactions  . Red Dye Hives and Itching  . Chloraprep One Step [Chlorhexidine Gluconate] Other (See  Comments)    CHEMICAL BURN, BLISTERS  . Cobalt Hives and Swelling    DESTRUCTION OF BONE  . Corticosteroids Swelling  . Eggs Or Egg-Derived Products Hives and Itching  . Latex Hives and Swelling    TIGHTNESS IN THROAT  . Morphine And Related Hives and Itching    ALL PAIN MEDS - MUST HAVE BENADRYL PRIOR TO PAIN MED.  . Nickel Hives and Swelling    DESTRUCTION OF BONE  . Other Hives and Other (See Comments)    Other reaction(s): Other Burns skin ALL METALS - HIVES, SWELLING Over the counter cough syrup- hives.  Antibiotic given for tick bites- swelling, hives, rash LOTIONS - HIVES (DUE TO THE DYE) FRAGRANCES - CAUSES MIGRAINES  . Peanuts [Peanut Oil] Hives and Swelling    TIGHTNESS IN THROAT  . Supartz [Sodium Hyaluronate] Hives and Swelling  . Surgical Lubricant Swelling  . Tape Other (See Comments)    BURNS SKIN  . Yellow Dyes (Non-Tartrazine) Hives       . Azithromycin Swelling  . Flexeril [Cyclobenzaprine] Swelling  . Percocet [Oxycodone-Acetaminophen] Swelling  . Soap Itching    DIAL, DOVE   Prior to Admission medications   Medication Sig Start Date End Date Taking? Authorizing Provider  albuterol (PROVENTIL) (2.5 MG/3ML) 0.083% nebulizer solution Take 3 mLs (2.5 mg total) by nebulization every 4 (four) hours as needed for wheezing (or coughing spells). 03/25/15  Yes Adelina Mings,  MD  ALPRAZolam Duanne Moron) 0.5 MG tablet Take 1 mg by mouth 3 (three) times daily as needed. Anxiety   Yes Historical Provider, MD  cyanocobalamin (,VITAMIN B-12,) 1000 MCG/ML injection Inject 1,000 mcg into the muscle once a week. Reported on 03/25/2015   Yes Historical Provider, MD  diphenhydrAMINE (BENADRYL) 50 MG tablet Take 50 mg by mouth every 4 (four) hours as needed for itching (prn , alternates with hydroxyzine).    Yes Historical Provider, MD  EPINEPHrine (EPIPEN 2-PAK) 0.3 mg/0.3 mL IJ SOAJ injection Inject 0.3 mLs (0.3 mg total) into the muscle as needed (in the event of a severe life-threatening reaction). 12/09/14  Yes Charlies Silvers, MD  estradiol (ESTRACE) 0.5 MG tablet Take 1 mg by mouth at bedtime.    Yes Historical Provider, MD  EVZIO 0.4 MG/0.4ML SOAJ AUTO INJECT AS NEEDED IN CASE OF EMERGENCY WHERE GRANDCHILDREN MAY GET A HOLD OF PATIENTS PAIN MEDICATIONS. 11/14/14  Yes Historical Provider, MD  fluticasone (FLONASE) 50 MCG/ACT nasal spray PLACE 2 SPRAYS INTO BOTH NOSTRILS DAILY. 06/09/15  Yes Charlies Silvers, MD  hydrOXYzine (ATARAX/VISTARIL) 25 MG tablet Take 50 mg by mouth every 4 (four) hours as needed for itching. Alternates with benadryl   Yes Historical Provider, MD  ketoconazole (NIZORAL) 2 % shampoo APPLICATIONS APPLY ON THE SKIN [place] ONCE WEEKLY. 11/19/14  Yes Historical Provider, MD  levothyroxine (SYNTHROID, LEVOTHROID) 50 MCG tablet Take 200 mcg by mouth daily. Pt can only take name brand   Yes Historical Provider, MD  Magnesium Oxide 500 MG TABS Take 500 mg by mouth daily. 05/12/15  Yes Historical Provider, MD  MethylPREDNISolone Acetate (DEPO-MEDROL IJ) Inject 1 each as directed daily as needed (allergic reaction.).   Yes Historical Provider, MD  mometasone-formoterol (DULERA) 200-5 MCG/ACT AERO INHALE TWO PUFFS TWICE DAILY TO PREVENT COUGH OR WHEEZE. RINSE, GARGLE, AND SPIT AFTER USE. USE WITH SPACER. 03/25/15  Yes Adelina Mings, MD  Multiple Vitamin (MULTIVITAMIN WITH  MINERALS) TABS tablet Take 1 tablet by mouth at bedtime.   Yes Historical Provider,  MD  OnabotulinumtoxinA (BOTOX IJ) Inject as directed every 3 (three) months. For migraines   Yes Historical Provider, MD  promethazine (PHENERGAN) 25 MG tablet Take 25-50 mg by mouth 2 (two) times daily as needed for nausea or vomiting.    Yes Historical Provider, MD  propranolol (INDERAL) 20 MG tablet Take 20 mg by mouth 2 (two) times daily. 05/12/15  Yes Historical Provider, MD  SUMAtriptan (IMITREX) 100 MG tablet Take 1 tablet at earliest onset of headache.  May repeat once in 2 hours if headache persists or recurs.  Do not exceed 2 tabs in 24 hours 06/16/15  Yes Adam Telford Nab, DO  topiramate (TOPAMAX) 25 MG tablet Take 75 mg by mouth 2 (two) times daily. 04/15/15  Yes Historical Provider, MD  UNABLE TO FIND once a week. Allergy injections - once  a week 2 shots   Yes Historical Provider, MD  Zoledronic Acid (RECLAST IV) Inject into the vein as directed. Yearly --  Last dose Oct 2015   Yes Historical Provider, MD     All other systems have been reviewed and were otherwise negative with the exception of those mentioned in the HPI and as above.  Physical Exam: There were no vitals filed for this visit.  General: Alert, no acute distress Cardiovascular: No pedal edema Respiratory: No cyanosis, no use of accessory musculature Skin: No lesions in the area of chief complaint Neurologic: Sensation intact distally Psychiatric: Patient is competent for consent with normal mood and affect Lymphatic: No axillary or cervical lymphadenopathy  MUSCULOSKELETAL: + spurling's on the left  Assessment/Plan: Left arm pain  Plan for Procedure(s): ANTERIOR CERVICAL DECOMPRESSION FUSION 4-5 WITH INSTRUMENTATION AND ILIAC CREST AUTOGRAFT   Sinclair Ship, MD 07/14/2015 8:41 AM

## 2015-07-15 ENCOUNTER — Telehealth: Payer: Self-pay

## 2015-07-15 ENCOUNTER — Ambulatory Visit: Payer: Self-pay

## 2015-07-15 ENCOUNTER — Ambulatory Visit
Admission: RE | Admit: 2015-07-15 | Discharge: 2015-07-15 | Disposition: A | Discharge status: Home / Self Care | Payer: BLUE CROSS/BLUE SHIELD | Source: Ambulatory Visit

## 2015-07-15 DIAGNOSIS — Z1231 Encounter for screening mammogram for malignant neoplasm of breast: Secondary | ICD-10-CM | POA: Diagnosis not present

## 2015-07-15 MED ORDER — CEFAZOLIN SODIUM-DEXTROSE 2-4 GM/100ML-% IV SOLN
2.0000 g | INTRAVENOUS | Status: DC
  Filled 2015-07-15: qty 100

## 2015-07-15 NOTE — Telephone Encounter (Signed)
If she gets an injection, she can only get Benadryl and Reglan (Toradol is an NSAID).  She will need a driver.

## 2015-07-15 NOTE — Telephone Encounter (Signed)
Pt will hold off for now. Will take some Benadryl she has at home.

## 2015-07-15 NOTE — Telephone Encounter (Signed)
Pt called. Has migraine. Would like to come in for injection. Pt scheduled for back surgery tomorrow and cannot take any NSAIDs. Pt stated she called and spoke to surgery office, who said she could have injection. Please advise.

## 2015-07-16 ENCOUNTER — Observation Stay (HOSPITAL_COMMUNITY)
Admission: RE | Admit: 2015-07-16 | Discharge: 2015-07-17 | Disposition: A | Discharge status: Home / Self Care | Payer: BLUE CROSS/BLUE SHIELD | Source: Ambulatory Visit | Attending: Orthopedic Surgery | Admitting: Orthopedic Surgery

## 2015-07-16 ENCOUNTER — Ambulatory Visit (HOSPITAL_COMMUNITY): Payer: BLUE CROSS/BLUE SHIELD | Admitting: Emergency Medicine

## 2015-07-16 ENCOUNTER — Ambulatory Visit (HOSPITAL_COMMUNITY): Payer: BLUE CROSS/BLUE SHIELD

## 2015-07-16 ENCOUNTER — Encounter (HOSPITAL_COMMUNITY)
Admission: RE | Disposition: A | Discharge status: Home / Self Care | Payer: Self-pay | Source: Ambulatory Visit | Attending: Orthopedic Surgery

## 2015-07-16 ENCOUNTER — Ambulatory Visit (HOSPITAL_COMMUNITY): Payer: BLUE CROSS/BLUE SHIELD | Admitting: Anesthesiology

## 2015-07-16 ENCOUNTER — Encounter (HOSPITAL_COMMUNITY): Payer: Self-pay | Admitting: *Deleted

## 2015-07-16 DIAGNOSIS — Z981 Arthrodesis status: Secondary | ICD-10-CM | POA: Diagnosis not present

## 2015-07-16 DIAGNOSIS — Z419 Encounter for procedure for purposes other than remedying health state, unspecified: Secondary | ICD-10-CM

## 2015-07-16 DIAGNOSIS — J449 Chronic obstructive pulmonary disease, unspecified: Secondary | ICD-10-CM | POA: Diagnosis not present

## 2015-07-16 DIAGNOSIS — Z87891 Personal history of nicotine dependence: Secondary | ICD-10-CM | POA: Diagnosis not present

## 2015-07-16 DIAGNOSIS — F419 Anxiety disorder, unspecified: Secondary | ICD-10-CM | POA: Diagnosis not present

## 2015-07-16 DIAGNOSIS — I10 Essential (primary) hypertension: Secondary | ICD-10-CM | POA: Diagnosis not present

## 2015-07-16 DIAGNOSIS — J45909 Unspecified asthma, uncomplicated: Secondary | ICD-10-CM | POA: Insufficient documentation

## 2015-07-16 DIAGNOSIS — G894 Chronic pain syndrome: Secondary | ICD-10-CM | POA: Diagnosis not present

## 2015-07-16 DIAGNOSIS — M5410 Radiculopathy, site unspecified: Secondary | ICD-10-CM | POA: Diagnosis present

## 2015-07-16 DIAGNOSIS — M797 Fibromyalgia: Secondary | ICD-10-CM | POA: Diagnosis not present

## 2015-07-16 DIAGNOSIS — M4802 Spinal stenosis, cervical region: Principal | ICD-10-CM | POA: Insufficient documentation

## 2015-07-16 DIAGNOSIS — E039 Hypothyroidism, unspecified: Secondary | ICD-10-CM | POA: Insufficient documentation

## 2015-07-16 DIAGNOSIS — Z96651 Presence of right artificial knee joint: Secondary | ICD-10-CM | POA: Insufficient documentation

## 2015-07-16 DIAGNOSIS — M79602 Pain in left arm: Secondary | ICD-10-CM | POA: Diagnosis not present

## 2015-07-16 DIAGNOSIS — M50321 Other cervical disc degeneration at C4-C5 level: Secondary | ICD-10-CM | POA: Diagnosis not present

## 2015-07-16 DIAGNOSIS — T8489XA Other specified complication of internal orthopedic prosthetic devices, implants and grafts, initial encounter: Secondary | ICD-10-CM | POA: Diagnosis not present

## 2015-07-16 DIAGNOSIS — K589 Irritable bowel syndrome without diarrhea: Secondary | ICD-10-CM | POA: Diagnosis not present

## 2015-07-16 HISTORY — PX: ANTERIOR CERVICAL DECOMP/DISCECTOMY FUSION: SHX1161

## 2015-07-16 LAB — COMPREHENSIVE METABOLIC PANEL
ALBUMIN: 3.4 g/dL — AB (ref 3.5–5.0)
ALK PHOS: 54 U/L (ref 38–126)
ALT: 13 U/L — ABNORMAL LOW (ref 14–54)
ANION GAP: 5 (ref 5–15)
AST: 19 U/L (ref 15–41)
BILIRUBIN TOTAL: 0.8 mg/dL (ref 0.3–1.2)
BUN: 13 mg/dL (ref 6–20)
CALCIUM: 8.8 mg/dL — AB (ref 8.9–10.3)
CO2: 25 mmol/L (ref 22–32)
Chloride: 109 mmol/L (ref 101–111)
Creatinine, Ser: 0.92 mg/dL (ref 0.44–1.00)
GFR calc Af Amer: >60 mL/min (ref >60)
GFR calc non Af Amer: >60 mL/min (ref >60)
GLUCOSE: 88 mg/dL (ref 65–99)
Potassium: 3.6 mmol/L (ref 3.5–5.1)
Sodium: 139 mmol/L (ref 135–145)
TOTAL PROTEIN: 6.5 g/dL (ref 6.5–8.1)

## 2015-07-16 LAB — CBC WITH DIFFERENTIAL/PLATELET
Basophils Absolute: 0 10*3/uL (ref 0.0–0.1)
Basophils Relative: 0 %
EOS PCT: 2 %
Eosinophils Absolute: 0.2 10*3/uL (ref 0.0–0.7)
HCT: 36.2 % (ref 36.0–46.0)
Hemoglobin: 11.5 g/dL — ABNORMAL LOW (ref 12.0–15.0)
LYMPHS ABS: 1.6 10*3/uL (ref 0.7–4.0)
LYMPHS PCT: 23 %
MCH: 27.6 pg (ref 26.0–34.0)
MCHC: 31.8 g/dL (ref 30.0–36.0)
MCV: 87 fL (ref 78.0–100.0)
MONO ABS: 0.5 10*3/uL (ref 0.1–1.0)
Monocytes Relative: 7 %
Neutro Abs: 4.7 10*3/uL (ref 1.7–7.7)
Neutrophils Relative %: 68 %
PLATELETS: 281 10*3/uL (ref 150–400)
RBC: 4.16 MIL/uL (ref 3.87–5.11)
RDW: 13.6 % (ref 11.5–15.5)
WBC: 7 10*3/uL (ref 4.0–10.5)

## 2015-07-16 LAB — APTT: APTT: 29 s (ref 24–37)

## 2015-07-16 LAB — PROTIME-INR
INR: 0.95 (ref 0.00–1.49)
PROTHROMBIN TIME: 12.9 s (ref 11.6–15.2)

## 2015-07-16 SURGERY — ANTERIOR CERVICAL DECOMPRESSION/DISCECTOMY FUSION 1 LEVEL
Anesthesia: General | Site: Spine Cervical

## 2015-07-16 MED ORDER — LEVOTHYROXINE SODIUM 100 MCG PO TABS
200.0000 ug | ORAL_TABLET | Freq: Every day | ORAL | Status: DC
  Administered 2015-07-16: 200 ug via ORAL
  Filled 2015-07-16: qty 2

## 2015-07-16 MED ORDER — POVIDONE-IODINE 7.5 % EX SOLN: Freq: Once | CUTANEOUS | Status: DC

## 2015-07-16 MED ORDER — ALBUTEROL SULFATE (2.5 MG/3ML) 0.083% IN NEBU: 2.5000 mg | INHALATION_SOLUTION | RESPIRATORY_TRACT | Status: DC | PRN

## 2015-07-16 MED ORDER — ADULT MULTIVITAMIN W/MINERALS CH
1.0000 | ORAL_TABLET | Freq: Every day | ORAL | Status: DC
  Administered 2015-07-16: 1 via ORAL
  Filled 2015-07-16: qty 1

## 2015-07-16 MED ORDER — DIPHENHYDRAMINE HCL 50 MG/ML IJ SOLN
25.0000 mg | Freq: Once | INTRAMUSCULAR | Status: AC
  Administered 2015-07-16: 25 mg via INTRAVENOUS
  Filled 2015-07-16: qty 0.5

## 2015-07-16 MED ORDER — MIDAZOLAM HCL 2 MG/2ML IJ SOLN
1.0000 mg | Freq: Once | INTRAMUSCULAR | Status: AC
  Administered 2015-07-16: 1 mg via INTRAVENOUS

## 2015-07-16 MED ORDER — THROMBIN 20000 UNITS EX KIT
PACK | CUTANEOUS | Status: AC
  Filled 2015-07-16: qty 1

## 2015-07-16 MED ORDER — LACTATED RINGERS IV SOLN
INTRAVENOUS | Status: DC
  Administered 2015-07-16 (×3): via INTRAVENOUS

## 2015-07-16 MED ORDER — ESTRADIOL 1 MG PO TABS
1.0000 mg | ORAL_TABLET | Freq: Every day | ORAL | Status: DC
  Administered 2015-07-16: 1 mg via ORAL
  Filled 2015-07-16: qty 1

## 2015-07-16 MED ORDER — SENNA 8.6 MG PO TABS
1.0000 | ORAL_TABLET | Freq: Two times a day (BID) | ORAL | Status: DC
  Administered 2015-07-16: 8.6 mg via ORAL
  Filled 2015-07-16: qty 1

## 2015-07-16 MED ORDER — ARTIFICIAL TEARS OP OINT
TOPICAL_OINTMENT | OPHTHALMIC | Status: AC
  Filled 2015-07-16: qty 7

## 2015-07-16 MED ORDER — DEXAMETHASONE SODIUM PHOSPHATE 10 MG/ML IJ SOLN
INTRAMUSCULAR | Status: AC
  Filled 2015-07-16: qty 1

## 2015-07-16 MED ORDER — CYANOCOBALAMIN 1000 MCG/ML IJ SOLN: 1000.0000 ug | INTRAMUSCULAR | Status: DC

## 2015-07-16 MED ORDER — MIDAZOLAM HCL 2 MG/2ML IJ SOLN
INTRAMUSCULAR | Status: AC
  Administered 2015-07-16: 1 mg via INTRAVENOUS
  Filled 2015-07-16: qty 4

## 2015-07-16 MED ORDER — EPINEPHRINE 0.3 MG/0.3ML IJ SOAJ: 0.3000 mg | INTRAMUSCULAR | Status: DC | PRN

## 2015-07-16 MED ORDER — TOPIRAMATE 25 MG PO TABS
75.0000 mg | ORAL_TABLET | Freq: Two times a day (BID) | ORAL | Status: DC
  Administered 2015-07-16: 75 mg via ORAL
  Filled 2015-07-16 (×2): qty 3

## 2015-07-16 MED ORDER — FENTANYL CITRATE (PF) 100 MCG/2ML IJ SOLN
INTRAMUSCULAR | Status: AC
  Administered 2015-07-16: 100 ug via INTRAVENOUS
  Filled 2015-07-16: qty 2

## 2015-07-16 MED ORDER — HEMOSTATIC AGENTS (NO CHARGE) OPTIME
TOPICAL | Status: DC | PRN
  Administered 2015-07-16: 1 via TOPICAL

## 2015-07-16 MED ORDER — DIPHENHYDRAMINE HCL 50 MG/ML IJ SOLN
25.0000 mg | Freq: Four times a day (QID) | INTRAMUSCULAR | Status: DC | PRN
Start: 2015-07-16 — End: 2015-07-17
  Filled 2015-07-16: qty 1

## 2015-07-16 MED ORDER — FENTANYL CITRATE (PF) 100 MCG/2ML IJ SOLN
100.0000 ug | Freq: Once | INTRAMUSCULAR | Status: AC
Start: 2015-07-16 — End: 2015-07-16
  Administered 2015-07-16: 100 ug via INTRAVENOUS

## 2015-07-16 MED ORDER — MAGNESIUM OXIDE -MG SUPPLEMENT 500 MG PO TABS: 500.0000 mg | ORAL_TABLET | Freq: Every day | ORAL | Status: DC

## 2015-07-16 MED ORDER — HYDROMORPHONE HCL 1 MG/ML IJ SOLN
0.2500 mg | INTRAMUSCULAR | Status: DC | PRN
  Administered 2015-07-16 (×2): 0.5 mg via INTRAVENOUS

## 2015-07-16 MED ORDER — POLYETHYLENE GLYCOL 3350 17 G PO PACK: 17.0000 g | PACK | Freq: Every day | ORAL | Status: DC | PRN

## 2015-07-16 MED ORDER — ONDANSETRON HCL 4 MG/2ML IJ SOLN: 4.0000 mg | INTRAMUSCULAR | Status: DC | PRN

## 2015-07-16 MED ORDER — CEFAZOLIN SODIUM-DEXTROSE 2-3 GM-% IV SOLR
INTRAVENOUS | Status: DC | PRN
Start: 2015-07-16 — End: 2015-07-16
  Administered 2015-07-16: 2 g via INTRAVENOUS

## 2015-07-16 MED ORDER — GLYCOPYRROLATE 0.2 MG/ML IJ SOLN
INTRAMUSCULAR | Status: DC | PRN
  Administered 2015-07-16 (×2): 0.2 mg via INTRAVENOUS

## 2015-07-16 MED ORDER — DIPHENHYDRAMINE HCL 50 MG/ML IJ SOLN
INTRAMUSCULAR | Status: AC
  Administered 2015-07-16: 25 mg via INTRAVENOUS
  Filled 2015-07-16: qty 1

## 2015-07-16 MED ORDER — HYDROXYZINE HCL 25 MG PO TABS
50.0000 mg | ORAL_TABLET | ORAL | Status: DC | PRN
  Administered 2015-07-16 – 2015-07-17 (×3): 50 mg via ORAL
  Filled 2015-07-16 (×3): qty 2

## 2015-07-16 MED ORDER — LIDOCAINE 2% (20 MG/ML) 5 ML SYRINGE
INTRAMUSCULAR | Status: AC
  Filled 2015-07-16: qty 10

## 2015-07-16 MED ORDER — HYDROMORPHONE HCL 1 MG/ML IJ SOLN
0.5000 mg | INTRAMUSCULAR | Status: DC | PRN
  Administered 2015-07-16 – 2015-07-17 (×3): 1 mg via INTRAVENOUS
  Filled 2015-07-16 (×3): qty 1

## 2015-07-16 MED ORDER — DIPHENHYDRAMINE HCL 25 MG PO CAPS
50.0000 mg | ORAL_CAPSULE | ORAL | Status: DC | PRN
  Administered 2015-07-16 – 2015-07-17 (×2): 50 mg via ORAL
  Filled 2015-07-16 (×3): qty 2

## 2015-07-16 MED ORDER — FENTANYL CITRATE (PF) 250 MCG/5ML IJ SOLN
INTRAMUSCULAR | Status: AC
  Filled 2015-07-16: qty 5

## 2015-07-16 MED ORDER — EPHEDRINE 5 MG/ML INJ
INTRAVENOUS | Status: AC
  Filled 2015-07-16: qty 20

## 2015-07-16 MED ORDER — HYDROMORPHONE HCL 2 MG PO TABS
1.0000 mg | ORAL_TABLET | ORAL | Status: DC | PRN
  Administered 2015-07-17 (×2): 2 mg via ORAL
  Filled 2015-07-16 (×2): qty 1

## 2015-07-16 MED ORDER — FENTANYL CITRATE (PF) 100 MCG/2ML IJ SOLN
INTRAMUSCULAR | Status: DC | PRN
  Administered 2015-07-16: 100 ug via INTRAVENOUS
  Administered 2015-07-16: 50 ug via INTRAVENOUS

## 2015-07-16 MED ORDER — PROMETHAZINE HCL 25 MG PO TABS: 25.0000 mg | ORAL_TABLET | Freq: Two times a day (BID) | ORAL | Status: DC | PRN

## 2015-07-16 MED ORDER — BISACODYL 10 MG RE SUPP: 10.0000 mg | Freq: Every day | RECTAL | Status: DC | PRN

## 2015-07-16 MED ORDER — MENTHOL 3 MG MT LOZG: 1.0000 | LOZENGE | OROMUCOSAL | Status: DC | PRN

## 2015-07-16 MED ORDER — BUPIVACAINE-EPINEPHRINE (PF) 0.25% -1:200000 IJ SOLN
INTRAMUSCULAR | Status: AC
  Filled 2015-07-16: qty 30

## 2015-07-16 MED ORDER — PROPRANOLOL HCL 20 MG PO TABS
20.0000 mg | ORAL_TABLET | Freq: Two times a day (BID) | ORAL | Status: DC
Start: 2015-07-16 — End: 2015-07-17
  Administered 2015-07-16 – 2015-07-17 (×2): 20 mg via ORAL
  Filled 2015-07-16 (×2): qty 1

## 2015-07-16 MED ORDER — MIDAZOLAM HCL 2 MG/2ML IJ SOLN
INTRAMUSCULAR | Status: AC
  Filled 2015-07-16: qty 2

## 2015-07-16 MED ORDER — FLUTICASONE PROPIONATE 50 MCG/ACT NA SUSP
1.0000 | Freq: Every day | NASAL | Status: DC
  Filled 2015-07-16: qty 16

## 2015-07-16 MED ORDER — SODIUM CHLORIDE 0.9 % IV SOLN: INTRAVENOUS | Status: DC

## 2015-07-16 MED ORDER — HYDROMORPHONE HCL 1 MG/ML IJ SOLN
INTRAMUSCULAR | Status: AC
  Filled 2015-07-16: qty 1

## 2015-07-16 MED ORDER — FLEET ENEMA 7-19 GM/118ML RE ENEM: 1.0000 | ENEMA | Freq: Once | RECTAL | Status: DC | PRN

## 2015-07-16 MED ORDER — GLYCOPYRROLATE 0.2 MG/ML IV SOSY
PREFILLED_SYRINGE | INTRAVENOUS | Status: AC
  Filled 2015-07-16: qty 6

## 2015-07-16 MED ORDER — ROCURONIUM BROMIDE 100 MG/10ML IV SOLN
INTRAVENOUS | Status: DC | PRN
  Administered 2015-07-16: 50 mg via INTRAVENOUS

## 2015-07-16 MED ORDER — 0.9 % SODIUM CHLORIDE (POUR BTL) OPTIME
TOPICAL | Status: DC | PRN
  Administered 2015-07-16: 1000 mL

## 2015-07-16 MED ORDER — DIAZEPAM 5 MG PO TABS
5.0000 mg | ORAL_TABLET | Freq: Four times a day (QID) | ORAL | Status: DC | PRN
  Administered 2015-07-16 – 2015-07-17 (×3): 5 mg via ORAL
  Filled 2015-07-16 (×3): qty 1

## 2015-07-16 MED ORDER — DEXAMETHASONE SODIUM PHOSPHATE 10 MG/ML IJ SOLN
INTRAMUSCULAR | Status: DC | PRN
  Administered 2015-07-16: 10 mg via INTRAVENOUS

## 2015-07-16 MED ORDER — MIDAZOLAM HCL 5 MG/5ML IJ SOLN
INTRAMUSCULAR | Status: DC | PRN
  Administered 2015-07-16 (×2): 1 mg via INTRAVENOUS

## 2015-07-16 MED ORDER — SODIUM CHLORIDE 0.9% FLUSH: 3.0000 mL | INTRAVENOUS | Status: DC | PRN

## 2015-07-16 MED ORDER — PROPOFOL 10 MG/ML IV BOLUS
INTRAVENOUS | Status: AC
  Filled 2015-07-16: qty 40

## 2015-07-16 MED ORDER — ONDANSETRON HCL 4 MG/2ML IJ SOLN
INTRAMUSCULAR | Status: AC
  Filled 2015-07-16: qty 2

## 2015-07-16 MED ORDER — BUPIVACAINE-EPINEPHRINE 0.25% -1:200000 IJ SOLN
INTRAMUSCULAR | Status: DC | PRN
  Administered 2015-07-16: 6 mL

## 2015-07-16 MED ORDER — PROPOFOL 10 MG/ML IV BOLUS
INTRAVENOUS | Status: DC | PRN
  Administered 2015-07-16: 200 mg via INTRAVENOUS

## 2015-07-16 MED ORDER — ARTIFICIAL TEARS OP OINT
TOPICAL_OINTMENT | OPHTHALMIC | Status: DC | PRN
  Administered 2015-07-16: 1 via OPHTHALMIC

## 2015-07-16 MED ORDER — SODIUM CHLORIDE 0.9% FLUSH: 3.0000 mL | Freq: Two times a day (BID) | INTRAVENOUS | Status: DC

## 2015-07-16 MED ORDER — ALPRAZOLAM 0.5 MG PO TABS: 1.0000 mg | ORAL_TABLET | Freq: Three times a day (TID) | ORAL | Status: DC | PRN

## 2015-07-16 MED ORDER — LACTATED RINGERS IV SOLN: INTRAVENOUS | Status: DC

## 2015-07-16 MED ORDER — CEFAZOLIN SODIUM 1-5 GM-% IV SOLN
1.0000 g | Freq: Three times a day (TID) | INTRAVENOUS | Status: AC
Start: 2015-07-16 — End: 2015-07-17
  Administered 2015-07-16 – 2015-07-17 (×2): 1 g via INTRAVENOUS
  Filled 2015-07-16 (×2): qty 50

## 2015-07-16 MED ORDER — PHENOL 1.4 % MT LIQD
1.0000 | OROMUCOSAL | Status: DC | PRN
Start: 2015-07-16 — End: 2015-07-17

## 2015-07-16 MED ORDER — NEOSTIGMINE METHYLSULFATE 5 MG/5ML IV SOSY
PREFILLED_SYRINGE | INTRAVENOUS | Status: AC
  Filled 2015-07-16: qty 5

## 2015-07-16 MED ORDER — EPHEDRINE SULFATE 50 MG/ML IJ SOLN
INTRAMUSCULAR | Status: DC | PRN
  Administered 2015-07-16: 10 mg via INTRAVENOUS

## 2015-07-16 MED ORDER — SUMATRIPTAN SUCCINATE 100 MG PO TABS
100.0000 mg | ORAL_TABLET | Freq: Once | ORAL | Status: AC
  Administered 2015-07-16: 100 mg via ORAL
  Filled 2015-07-16: qty 1

## 2015-07-16 MED ORDER — THROMBIN 20000 UNITS EX KIT
PACK | CUTANEOUS | Status: DC | PRN
  Administered 2015-07-16: 20000 [IU] via TOPICAL

## 2015-07-16 MED ORDER — ONDANSETRON HCL 4 MG/2ML IJ SOLN
INTRAMUSCULAR | Status: DC | PRN
  Administered 2015-07-16: 4 mg via INTRAVENOUS

## 2015-07-16 MED ORDER — NEOSTIGMINE METHYLSULFATE 10 MG/10ML IV SOLN
INTRAVENOUS | Status: DC | PRN
  Administered 2015-07-16: 4 mg via INTRAVENOUS

## 2015-07-16 MED ORDER — PHENYLEPHRINE HCL 10 MG/ML IJ SOLN
INTRAMUSCULAR | Status: DC | PRN
Start: 2015-07-16 — End: 2015-07-16
  Administered 2015-07-16 (×2): 80 ug via INTRAVENOUS

## 2015-07-16 MED ORDER — ROCURONIUM BROMIDE 50 MG/5ML IV SOLN
INTRAVENOUS | Status: AC
  Filled 2015-07-16: qty 2

## 2015-07-16 MED ORDER — LIDOCAINE HCL (CARDIAC) 20 MG/ML IV SOLN
INTRAVENOUS | Status: DC | PRN
  Administered 2015-07-16: 75 mg via INTRAVENOUS

## 2015-07-16 MED ORDER — PHENYLEPHRINE 40 MCG/ML (10ML) SYRINGE FOR IV PUSH (FOR BLOOD PRESSURE SUPPORT)
PREFILLED_SYRINGE | INTRAVENOUS | Status: AC
  Filled 2015-07-16: qty 20

## 2015-07-16 MED ORDER — ATROPINE SULFATE 1 MG/ML IJ SOLN
INTRAMUSCULAR | Status: DC | PRN
  Administered 2015-07-16: 0.4 mg via INTRAVENOUS
  Administered 2015-07-16: 0.2 mg via INTRAVENOUS

## 2015-07-16 MED ORDER — MOMETASONE FURO-FORMOTEROL FUM 200-5 MCG/ACT IN AERO
2.0000 | INHALATION_SPRAY | Freq: Two times a day (BID) | RESPIRATORY_TRACT | Status: DC
  Administered 2015-07-16 – 2015-07-17 (×2): 2 via RESPIRATORY_TRACT
  Filled 2015-07-16: qty 8.8

## 2015-07-16 SURGICAL SUPPLY — 79 items
BENZOIN TINCTURE PRP APPL 2/3 (GAUZE/BANDAGES/DRESSINGS) ×4 IMPLANT
BIT DRILL NEURO 2X3.1 SFT TUCH (MISCELLANEOUS) ×1 IMPLANT
BIT DRILL SRG 14X2.2XFLT CHK (BIT) ×1 IMPLANT
BIT DRL SRG 14X2.2XFLT CHK (BIT) ×1
BLADE SURG 15 STRL LF DISP TIS (BLADE) ×1 IMPLANT
BLADE SURG 15 STRL SS (BLADE) ×1
BLADE SURG ROTATE 9660 (MISCELLANEOUS) IMPLANT
BUR MATCHSTICK NEURO 3.0 LAGG (BURR) IMPLANT
CARTRIDGE OIL MAESTRO DRILL (MISCELLANEOUS) ×1 IMPLANT
COLLAR CERV LO CONTOUR FIRM DE (SOFTGOODS) ×2 IMPLANT
CORDS BIPOLAR (ELECTRODE) ×2 IMPLANT
COVER SURGICAL LIGHT HANDLE (MISCELLANEOUS) ×2 IMPLANT
CRADLE DONUT ADULT HEAD (MISCELLANEOUS) ×2 IMPLANT
DIFFUSER DRILL AIR PNEUMATIC (MISCELLANEOUS) ×2 IMPLANT
DRAIN JACKSON RD 7FR 3/32 (WOUND CARE) IMPLANT
DRAPE C-ARM 42X72 X-RAY (DRAPES) ×2 IMPLANT
DRAPE INCISE IOBAN 66X45 STRL (DRAPES) ×4 IMPLANT
DRAPE POUCH INSTRU U-SHP 10X18 (DRAPES) ×2 IMPLANT
DRAPE SURG 17X23 STRL (DRAPES) ×20 IMPLANT
DRILL BIT SKYLINE 14MM (BIT) ×1
DRILL NEURO 2X3.1 SOFT TOUCH (MISCELLANEOUS) ×2
DURAPREP 26ML APPLICATOR (WOUND CARE) ×2 IMPLANT
ELECT COATED BLADE 2.86 ST (ELECTRODE) ×2 IMPLANT
ELECT REM PT RETURN 9FT ADLT (ELECTROSURGICAL) ×2
ELECTRODE REM PT RTRN 9FT ADLT (ELECTROSURGICAL) ×1 IMPLANT
EVACUATOR SILICONE 100CC (DRAIN) IMPLANT
GAUZE SPONGE 4X4 12PLY STRL (GAUZE/BANDAGES/DRESSINGS) ×2 IMPLANT
GAUZE SPONGE 4X4 16PLY XRAY LF (GAUZE/BANDAGES/DRESSINGS) ×4 IMPLANT
GLOVE BIO SURGEON STRL SZ7 (GLOVE) ×2 IMPLANT
GLOVE BIO SURGEON STRL SZ8 (GLOVE) ×2 IMPLANT
GLOVE BIOGEL PI IND STRL 7.0 (GLOVE) ×2 IMPLANT
GLOVE BIOGEL PI IND STRL 8 (GLOVE) ×1 IMPLANT
GLOVE BIOGEL PI INDICATOR 7.0 (GLOVE) ×2
GLOVE BIOGEL PI INDICATOR 8 (GLOVE) ×1
GLOVE SURG SS PI 7.0 STRL IVOR (GLOVE) ×2 IMPLANT
GLOVE SURG SS PI 8.0 STRL IVOR (GLOVE) ×2 IMPLANT
GOWN STRL REUS W/ TWL LRG LVL3 (GOWN DISPOSABLE) ×1 IMPLANT
GOWN STRL REUS W/ TWL XL LVL3 (GOWN DISPOSABLE) ×1 IMPLANT
GOWN STRL REUS W/TWL LRG LVL3 (GOWN DISPOSABLE) ×1
GOWN STRL REUS W/TWL XL LVL3 (GOWN DISPOSABLE) ×1
IV CATH 14GX2 1/4 (CATHETERS) ×2 IMPLANT
KIT BASIN OR (CUSTOM PROCEDURE TRAY) ×2 IMPLANT
KIT ROOM TURNOVER OR (KITS) ×2 IMPLANT
MANIFOLD NEPTUNE II (INSTRUMENTS) ×2 IMPLANT
MARKER SKIN DUAL TIP RULER LAB (MISCELLANEOUS) ×6 IMPLANT
NEEDLE 27GAX1X1/2 (NEEDLE) ×2 IMPLANT
NEEDLE SPNL 20GX3.5 QUINCKE YW (NEEDLE) ×2 IMPLANT
NS IRRIG 1000ML POUR BTL (IV SOLUTION) ×2 IMPLANT
OIL CARTRIDGE MAESTRO DRILL (MISCELLANEOUS) ×2
PACK ORTHO CERVICAL (CUSTOM PROCEDURE TRAY) ×2 IMPLANT
PACK UNIVERSAL I (CUSTOM PROCEDURE TRAY) ×2 IMPLANT
PAD ARMBOARD 7.5X6 YLW CONV (MISCELLANEOUS) ×4 IMPLANT
PATTIES SURGICAL .5 X.5 (GAUZE/BANDAGES/DRESSINGS) ×2 IMPLANT
PATTIES SURGICAL .5 X1 (DISPOSABLE) ×2 IMPLANT
PIN DISTRACTION 14 (PIN) ×4 IMPLANT
PLATE SKYLINE 12MM (Plate) ×2 IMPLANT
PRECISION THIN BLADE (9 X 0.38 X 31MM) ×2 IMPLANT
SCREW SKYLINE VAR OS 14MM (Screw) ×8 IMPLANT
SPONGE INTESTINAL PEANUT (DISPOSABLE) ×2 IMPLANT
SPONGE SURGIFOAM ABS GEL 100 (HEMOSTASIS) ×2 IMPLANT
STAPLER VISISTAT 35W (STAPLE) ×2 IMPLANT
STRIP CLOSURE SKIN 1/2X4 (GAUZE/BANDAGES/DRESSINGS) ×4 IMPLANT
SURGIFLO W/THROMBIN 8M KIT (HEMOSTASIS) IMPLANT
SUT BONE WAX W31G (SUTURE) ×2 IMPLANT
SUT MNCRL AB 4-0 PS2 18 (SUTURE) ×2 IMPLANT
SUT SILK 4 0 (SUTURE)
SUT SILK 4-0 18XBRD TIE 12 (SUTURE) IMPLANT
SUT VIC AB 0 CT1 18XCR BRD 8 (SUTURE) ×1 IMPLANT
SUT VIC AB 0 CT1 8-18 (SUTURE) ×1
SUT VIC AB 1 CTX 18 (SUTURE) ×2 IMPLANT
SUT VIC AB 2-0 CT2 18 VCP726D (SUTURE) ×4 IMPLANT
SYR BULB IRRIGATION 50ML (SYRINGE) ×2 IMPLANT
SYR CONTROL 10ML LL (SYRINGE) ×4 IMPLANT
TAPE CLOTH 4X10 WHT NS (GAUZE/BANDAGES/DRESSINGS) ×2 IMPLANT
TAPE UMBILICAL COTTON 1/8X30 (MISCELLANEOUS) ×2 IMPLANT
TOWEL OR 17X24 6PK STRL BLUE (TOWEL DISPOSABLE) ×4 IMPLANT
TOWEL OR 17X26 10 PK STRL BLUE (TOWEL DISPOSABLE) ×2 IMPLANT
WATER STERILE IRR 1000ML POUR (IV SOLUTION) ×2 IMPLANT
YANKAUER SUCT BULB TIP NO VENT (SUCTIONS) ×2 IMPLANT

## 2015-07-16 NOTE — Progress Notes (Signed)
Orthopedic Tech Progress Note Patient Details:  Kari Green 03/20/1965 [id number]  Ortho Devices Type of Ortho Device: Soft collar Ortho Device/Splint Location: neck Ortho Device/Splint Interventions: Ordered As ordered by Stephani Police, Shakiah Wester 07/16/2015, 12:57 PM

## 2015-07-16 NOTE — Op Note (Signed)
NAMETORRY, DONA            ACCOUNT NO.:  [id number]  MEDICAL RECORD NO.:  [id number]  LOCATION:  3C08C                        FACILITY:  [facility]  PHYSICIAN:  Phylliss Bob, MD      DATE OF BIRTH:  1965-03-31  DATE OF PROCEDURE:  07/16/2015                              OPERATIVE REPORT   PREOPERATIVE DIAGNOSES: 1. Adjacent segment disease, C4-5. 2. Status post previous C5 to C7 ACDF with instrumentation. 3. Left-sided C4-5 neural foraminal stenosis resulting in left arm     pain.  POSTOPERATIVE DIAGNOSES: 1. Adjacent segment disease, C4-5. 2. Status post previous C5 to C7 ACDF with instrumentation. 3. Left-sided C4-5 neural foraminal stenosis resulting in left arm     pain.  PROCEDURES: 1. Anterior cervical decompression and fusion, C4-5. 2. Placement of anterior instrumentation, C4-5. 3. Intraoperative use of fluoroscopy. 4. Removal of anterior instrumentation, C5-C7. 5. Use and harvesting of tricortical iliac crest autograft using a     separate incision overlying the patient's right iliac crest. 6. Exploration of spinal fusion C5-C7  SURGEON:  Phylliss Bob, MD.  ASSISTANTPricilla Holm, PA-C.  ANESTHESIA:  General endotracheal anesthesia.  COMPLICATIONS:  None.  DISPOSITION:  Stable.  ESTIMATED BLOOD LOSS:  Minimal.  INDICATIONS FOR SURGERY:  Briefly, Ms. Maxim is a very pleasant, 50- year-old female, who did present to me with ongoing pain in the left arm.  Of note, the patient is many years status post a C5 to C7 ACDF procedure.  The patient did very well from that procedure and did have resolution of her radiculopathy pain.  She did present with pain in the left arm consistent with left C5 radiculopathy.  An updated MRI did reveal neural foraminal stenosis at the level above her fusion, at C4-5. Given the patient's ongoing pain and dysfunction, we did discuss proceeding with the procedure reflected above.  Of note, the patient is very concerned  given her sensitivity to multiple medications and metals. She did wish for autograft to be used for her intervertebral bone.  I did express to her the pros and cons of proceeding with harvesting of iliac crest, and after significant deliberation, on the morning of surgery, she did elect to proceed with structural iliac crest from her right iliac crest.  Of note, iliac crest was used for the patient's previous surgery years ago using the left iliac crest, and the patient did very well from that procedure.  After full understanding of the risks and limitations, including chronic pain from the donor site as well as infection over the donor site, the patient did elect to proceed.  OPERATIVE DETAILS:  On Jul 16, 2015, the patient was brought to surgery and general endotracheal anesthesia was administered.  The patient was placed supine on the hospital bed.  The neck was gently extended.  The neck was prepped and draped as was the region of her right iliac crest. The neck and iliac crest region was prepped in the usual fashion.  A time-out was performed.  A right-sided transverse incision was made in line with the C4-5 intervertebral space.  The platysma was incised.  A Smith-Robinson approach was used, and the anterior spine was noted.  The previously  placed hardware spanning C5-C7 was identified and removed. I  then proceeded with exploring the fusion. I placed caspar pins into the  C5 and C7 vertebral bodies and performed and pushing and pulling manuveur.  No motion was identified across the C5/6 or C6/7 intervertebral spaces.  Bone wax was placed in the holes where the previous screws were in.  I then proceeded with a thorough and complete C4-5 intervertebral diskectomy.  Caspar pins were used for distraction.  A self-retaining retractor was also placed.  After the diskectomy was accomplished, I did perform a thorough and complete bilateral neural foraminal decompression using #1 followed  by #2 Kerrison.  I was very pleased with the decompression that I was able to accomplish.  The endplates were then prepared.  I then placed trials and I did feel that I did need to fashion an intervertebral implant of approximately 7 mm in height.  At this point, I made an incision over the patient's right iliac crest, centered approximately 5 cm posterior to the anterior superior iliac spine.  The crest was subperiosteally exposed, then I did use a saw to remove a segment of bone, which I did feel was of the appropriate dimensions.  I then again used the saw to fashion the bone to the appropriate degree of lordosis.  Again, the tricortical height was fashioned to be approximately 7 mm in height.  The patient's iliac crest  was rather thin, however, I was able to achieve an adequate structural graft.   With the intervertebral space under distraction, the autograft was tamped into position in the usual fashion.  I was pleased with the press-fit.  The autograft was countersunked approximately 2 mm past the anterior vertebral body. I was very pleased with the fluoroscopic images.  I then chose a 12 mm anterior cervical plate, which was placed over the anterior spine.  A 14 mm variable angle screws were placed, 2 in each vertebral body at C4 and C5 for a total 4 vertebral body screws.  The screws were then locked to the plate using the Cam locking mechanism.  I was very pleased with the press-fit of the screws and the final fluoroscopic images.  The wound was then copiously irrigated.  The wound was then closed in layers.  The iliac crest incision was closed using #1 Vicryl, followed by 2-0 Vicryl, followed by 3-0 Monocryl.  The right-sided transverse neck incision was closed using 2-0 Vicryl, followed by 3-0 Monocryl.  Benzoin and Steri- Strips were applied, followed by sterile dressing.  All instrument counts were correct at the termination of the procedure.  Of note, Pricilla Holm was my  assistant throughout surgery and did aid in retraction, suctioning, and closure from start to finish.     Phylliss Bob, MD     MD/MEDQ  D:  07/16/2015  T:  07/16/2015  Job:  [id number]

## 2015-07-16 NOTE — Anesthesia Procedure Notes (Addendum)
Procedure Name: Intubation Date/Time: 07/16/2015 11:30 AM Performed by: Scheryl Darter Pre-anesthesia Checklist: Patient identified, Emergency Drugs available, Suction available and Patient being monitored Patient Re-evaluated:Patient Re-evaluated prior to inductionOxygen Delivery Method: Circle System Utilized Preoxygenation: Pre-oxygenation with 100% oxygen Intubation Type: IV induction Ventilation: Mask ventilation without difficulty Laryngoscope Size: Glidescope Grade View: Grade II Tube type: Oral Tube size: 6.5 mm Number of attempts: 1 Airway Equipment and Method: Stylet and Oral airway Placement Confirmation: ETT inserted through vocal cords under direct vision,  positive ETCO2 and breath sounds checked- equal and bilateral Secured at: 20 cm Tube secured with: Tape Dental Injury: Teeth and Oropharynx as per pre-operative assessment  Difficulty Due To: Difficulty was anticipated, Difficult Airway- due to anterior larynx, Difficult Airway- due to limited oral opening and Difficult Airway- due to dentition Comments: Pt with full set of dental crowns and small mouth opening/glidescope used/intubation with ease/teeth intact

## 2015-07-16 NOTE — Anesthesia Preprocedure Evaluation (Signed)
Anesthesia Evaluation  Patient identified by MRN, date of birth, ID band Patient awake    Reviewed: Allergy & Precautions, H&P , NPO status , Patient's Chart, lab work & pertinent test results, reviewed documented beta blocker date and time   History of Anesthesia Complications (+) PONV and history of anesthetic complications  Airway Mallampati: II  TM Distance: >3 FB Neck ROM: full    Dental  (+) Caps, Dental Advisory Given All front teeth are capped:   Pulmonary asthma , COPD,  COPD inhaler, former smoker,    Pulmonary exam normal breath sounds clear to auscultation       Cardiovascular hypertension, Pt. on home beta blockers Normal cardiovascular exam Rhythm:regular Rate:Normal     Neuro/Psych  Headaches, PSYCHIATRIC DISORDERS Anxiety  Neuromuscular disease negative psych ROS   GI/Hepatic Neg liver ROS, hiatal hernia,   Endo/Other  Hypothyroidism   Renal/GU Renal diseaseIC  negative genitourinary   Musculoskeletal  (+) Arthritis , Fibromyalgia -  Abdominal   Peds  Hematology negative hematology ROS (+)   Anesthesia Other Findings Chronic pain syndrome  Reproductive/Obstetrics negative OB ROS                             Anesthesia Physical Anesthesia Plan  ASA: III  Anesthesia Plan: General   Post-op Pain Management:    Induction: Intravenous  Airway Management Planned: Oral ETT  Additional Equipment: CVP  Intra-op Plan:   Post-operative Plan: Extubation in OR  Informed Consent: I have reviewed the patients History and Physical, chart, labs and discussed the procedure including the risks, benefits and alternatives for the proposed anesthesia with the patient or authorized representative who has indicated his/her understanding and acceptance.   Dental Advisory Given  Plan Discussed with: CRNA and Surgeon  Anesthesia Plan Comments:         Anesthesia Quick  Evaluation

## 2015-07-16 NOTE — Anesthesia Postprocedure Evaluation (Signed)
Anesthesia Post Note  Patient: Kari Green  Procedure(s) Performed: Procedure(s) (LRB): ANTERIOR CERVICAL DECOMPRESSION FUSION 4-5 WITH INSTRUMENTATION AND AUTOGRAFT (N/A)  Patient location during evaluation: PACU Anesthesia Type: General Level of consciousness: awake and alert Pain management: pain level controlled Vital Signs Assessment: post-procedure vital signs reviewed and stable Respiratory status: spontaneous breathing, nonlabored ventilation, respiratory function stable and patient connected to nasal cannula oxygen Cardiovascular status: blood pressure returned to baseline and stable Postop Assessment: no signs of nausea or vomiting Anesthetic complications: no    Last Vitals:  Filed Vitals:   07/16/15 1600 07/16/15 1955  BP: 116/86 111/67  Pulse:  58  Temp:  36.4 C  Resp:  16    Last Pain:  Filed Vitals:   07/16/15 1956  PainSc: 8                  Zandon Talton L

## 2015-07-16 NOTE — Transfer of Care (Signed)
Immediate Anesthesia Transfer of Care Note  Patient: Kari Green  Procedure(s) Performed: Procedure(s) with comments: ANTERIOR CERVICAL DECOMPRESSION FUSION 4-5 WITH INSTRUMENTATION AND AUTOGRAFT (N/A) - ANTERIOR CERVICAL DECOMPRESSION FUSION 4-5 WITH INSTRUMENTATION AND ALLOGRAFT  Patient Location: PACU  Anesthesia Type:General  Level of Consciousness: awake, alert , oriented and sedated  Airway & Oxygen Therapy: Patient Spontanous Breathing and Patient connected to nasal cannula oxygen  Post-op Assessment: Report given to RN, Post -op Vital signs reviewed and stable and Patient moving all extremities  Post vital signs: Reviewed and stable  Last Vitals:  Filed Vitals:   07/16/15 0905  BP: 106/72  Pulse: 56  Temp: 37.1 C  Resp: 20    Last Pain:  Filed Vitals:   07/16/15 0924  PainSc: 9       Patients Stated Pain Goal: 3 ([id number])  Complications: No apparent anesthesia complications

## 2015-07-17 ENCOUNTER — Encounter (HOSPITAL_COMMUNITY): Payer: Self-pay | Admitting: Orthopedic Surgery

## 2015-07-17 DIAGNOSIS — J45909 Unspecified asthma, uncomplicated: Secondary | ICD-10-CM | POA: Diagnosis not present

## 2015-07-17 DIAGNOSIS — Z96651 Presence of right artificial knee joint: Secondary | ICD-10-CM | POA: Diagnosis not present

## 2015-07-17 DIAGNOSIS — M4802 Spinal stenosis, cervical region: Secondary | ICD-10-CM | POA: Diagnosis not present

## 2015-07-17 DIAGNOSIS — I10 Essential (primary) hypertension: Secondary | ICD-10-CM | POA: Diagnosis not present

## 2015-07-17 DIAGNOSIS — J449 Chronic obstructive pulmonary disease, unspecified: Secondary | ICD-10-CM | POA: Diagnosis not present

## 2015-07-17 DIAGNOSIS — G894 Chronic pain syndrome: Secondary | ICD-10-CM | POA: Diagnosis not present

## 2015-07-17 DIAGNOSIS — Z87891 Personal history of nicotine dependence: Secondary | ICD-10-CM | POA: Diagnosis not present

## 2015-07-17 DIAGNOSIS — E039 Hypothyroidism, unspecified: Secondary | ICD-10-CM | POA: Diagnosis not present

## 2015-07-17 DIAGNOSIS — F419 Anxiety disorder, unspecified: Secondary | ICD-10-CM | POA: Diagnosis not present

## 2015-07-17 MED ORDER — HYDROMORPHONE HCL 2 MG PO TABS: 1.0000 mg | ORAL_TABLET | ORAL | Status: DC | PRN

## 2015-07-17 MED ORDER — METHYLPREDNISOLONE SODIUM SUCC 125 MG IJ SOLR
125.0000 mg | Freq: Once | INTRAMUSCULAR | Status: AC
  Administered 2015-07-17: 125 mg via INTRAVENOUS

## 2015-07-17 MED ORDER — SUMATRIPTAN SUCCINATE 100 MG PO TABS
100.0000 mg | ORAL_TABLET | ORAL | Status: DC | PRN
  Administered 2015-07-17: 100 mg via ORAL
  Filled 2015-07-17 (×2): qty 1

## 2015-07-17 MED ORDER — METHYLPREDNISOLONE SODIUM SUCC 125 MG IJ SOLR
INTRAMUSCULAR | Status: AC
  Filled 2015-07-17: qty 2

## 2015-07-17 NOTE — Progress Notes (Signed)
Pt. discharged home accompanied by husband. Prescriptions and discharge instructions given with verbalization of understanding. Incision site on neck with no s/s of infection - no swelling, redness, bleeding, and/or drainage noted. Soft collar intact. Opportunity given to ask questions but no question asked. Pt. transported out of this unit in wheelchair by the volunteer   

## 2015-07-17 NOTE — Progress Notes (Signed)
    Patient doing well Patient denies arm pain Neck pain and IC harvest site pain minimal Has been ambulating   Physical Exam: Filed Vitals:   07/17/15 0128 07/17/15 0400  BP: 106/71 113/71  Pulse: 65 56  Temp:  97.8 F (36.6 C)  Resp:  18    Dressing in place NVI Neck soft/supple  POD #1 s/p C4/5 ACDF, doing very well  - encourage ambulation - Dialudid for pain, Valium for muscle spasms - d/c home later today

## 2015-07-22 ENCOUNTER — Ambulatory Visit: Payer: Self-pay

## 2015-07-23 ENCOUNTER — Ambulatory Visit (INDEPENDENT_AMBULATORY_CARE_PROVIDER_SITE_OTHER): Payer: BLUE CROSS/BLUE SHIELD | Admitting: *Deleted

## 2015-07-23 ENCOUNTER — Telehealth: Payer: Self-pay | Admitting: Neurology

## 2015-07-23 DIAGNOSIS — G43919 Migraine, unspecified, intractable, without status migrainosus: Secondary | ICD-10-CM | POA: Diagnosis not present

## 2015-07-23 DIAGNOSIS — M25551 Pain in right hip: Secondary | ICD-10-CM | POA: Diagnosis not present

## 2015-07-23 MED ORDER — METOCLOPRAMIDE HCL 5 MG PO TABS: 5.0000 mg | ORAL_TABLET | Freq: Once | ORAL | Status: AC

## 2015-07-23 MED ORDER — KETOROLAC TROMETHAMINE 30 MG/ML IJ SOLN
30.0000 mg | Freq: Once | INTRAMUSCULAR | Status: AC
  Administered 2015-07-23: 30 mg via INTRAMUSCULAR

## 2015-07-23 MED ORDER — METOCLOPRAMIDE HCL 5 MG/ML IJ SOLN
10.0000 mg | Freq: Once | INTRAMUSCULAR | Status: AC
  Administered 2015-07-23: 10 mg via INTRAMUSCULAR

## 2015-07-23 MED ORDER — SUMATRIPTAN SUCCINATE 100 MG PO TABS: ORAL_TABLET | ORAL | Status: DC

## 2015-07-23 MED ORDER — KETOROLAC TROMETHAMINE 30 MG/ML IJ SOLN: 30.0000 mg | Freq: Once | INTRAMUSCULAR | Status: AC

## 2015-07-23 MED ORDER — DIPHENHYDRAMINE HCL 50 MG/ML IJ SOLN
25.0000 mg | Freq: Once | INTRAMUSCULAR | Status: AC
Start: 2015-07-23 — End: 2015-07-23
  Administered 2015-07-23: 25 mg via INTRAMUSCULAR

## 2015-07-23 NOTE — Telephone Encounter (Signed)
Kari Green 02/28/1965. She was calling to see if she could come in today for her migraine. She has been unable to come in due to her having Spinal Surgery. She has had the surgery now and would like to get the medication or cocktail? She does have an appointment near by this afternoon around 3:00. Her number is [phone]. Thank you

## 2015-07-23 NOTE — Telephone Encounter (Signed)
Yes.  However, just as before, she cannot take the Toradol as it is an NSAID (assumming that she still cannot take NSAIDs)

## 2015-07-23 NOTE — Telephone Encounter (Signed)
Pt called to see if she can come for headache cocktail. Pt had back surgery on 07/16/15. Pt has been having migraine since the day before surgery, but has not been able to take anything due to the surgery. Please advise.

## 2015-07-23 NOTE — Progress Notes (Signed)
Patient in for headache cocktail.

## 2015-07-23 NOTE — Telephone Encounter (Signed)
Kari Green 03/08/1965. She was in today for her headache cocktail. She said that she is out of her medication that she takes for her headaches. He was unsure of the name. She said she takes one then 2 hours later another if not better. Her pharmacy is [facility] in [place]. Her number is [phone]. Thank you

## 2015-07-24 NOTE — Discharge Summary (Signed)
Patient ID: AVAEH GASQUE MRN: [id number] DOB/AGE: 1966-01-22 50 y.o.  Admit date: 07/16/2015 Discharge date: 07/17/2015  Admission Diagnoses:  Active Problems:   Radiculopathy   Discharge Diagnoses:  Same  Past Medical History  Diagnosis Date  . Anxiety   . IC (interstitial cystitis)   . Osteoporosis   . Irritable bowel syndrome (IBS)   . History of hiatal hernia   . Migraines   . Hypothyroidism (acquired)   . History of kidney stones 04/2014  . Pain syndrome, chronic   . Arthritis     right thumb  . Degenerative disc disease, cervical   . Degenerative disc disease, lumbar   . Chronic, continuous use of opioids   . Seasonal allergies   . Sciatica     bilateral  . Fibromyalgia   . Dental crowns present   . Difficulty swallowing pills   . Difficult intravenous access   . Delayed gastric emptying   . Esophageal dysmotility   . Hemangioma, renal   . Hemangioma of liver   . Limited joint range of motion     right knee - unable to fully extend right leg  . Knee pain, right     states gets "nerves burned" periodically  . Cognitive dysfunction     patient denies  . Pain management   . Kidney stone   . Family history of adverse reaction to anesthesia     family - itching, hives, PONV  . PONV (postoperative nausea and vomiting)     uncontrollable itching with anesthesia; hx. of anesthesia awareness  . PONV (postoperative nausea and vomiting)     "need medication before and need medication afterwards"  . Complication of anesthesia     "not asleep and aware of everything"  . Memory impairment     due to pain   . History of pneumonia   . Hypertension   . Asthma   . History of bronchitis   . Cancer [facility])     history of skin cancer    Surgeries: Procedure(s): ANTERIOR CERVICAL DECOMPRESSION FUSION 4-5 WITH INSTRUMENTATION AND AUTOGRAFT on 07/16/2015   Consultants:  None  Discharged Condition: Improved  Hospital Course: Kari Green is an 50 y.o.  female who was admitted 07/16/2015 for operative treatment of radiculopathy. Patient has severe unremitting pain that affects sleep, daily activities, and work/hobbies. After pre-op clearance the patient was taken to the operating room on 07/16/2015 and underwent  Procedure(s): ANTERIOR CERVICAL DECOMPRESSION FUSION 4-5 WITH INSTRUMENTATION AND AUTOGRAFT.    Patient was given perioperative antibiotics:  Anti-infectives    Start     Dose/Rate Route Frequency Ordered Stop   07/16/15 2000  ceFAZolin (ANCEF) IVPB 1 g/50 mL premix     1 g 100 mL/hr over 30 Minutes Intravenous Every 8 hours 07/16/15 1634 07/17/15 0408   07/15/15 1100  ceFAZolin (ANCEF) IVPB 2g/100 mL premix  Status:  Discontinued     2 g 200 mL/hr over 30 Minutes Intravenous To ShortStay [facility] 07/15/15 1012 07/16/15 1634       Patient was given sequential compression devices, early ambulation to prevent DVT.  Patient benefited maximally from hospital stay and there were no complications.    Recent vital signs: BP 114/59 mmHg  Pulse 67  Temp(Src) 98.2 F (36.8 C) (Oral)  Resp 18  Ht 5' 2.5" (1.588 m)  Wt 57.55 kg (126 lb 14 oz)  BMI 22.82 kg/m2  SpO2 98%   Discharge Medications:  Medication List    STOP taking these medications        SUMAtriptan 100 MG tablet  Commonly known as:  IMITREX      TAKE these medications        albuterol (2.5 MG/3ML) 0.083% nebulizer solution  Commonly known as:  PROVENTIL  Take 3 mLs (2.5 mg total) by nebulization every 4 (four) hours as needed for wheezing (or coughing spells).     ALPRAZolam 0.5 MG tablet  Commonly known as:  XANAX  Take 1 mg by mouth 3 (three) times daily as needed. Anxiety     BOTOX IJ  Inject as directed every 3 (three) months. For migraines     cyanocobalamin 1000 MCG/ML injection  Commonly known as:  (VITAMIN B-12)  Inject 1,000 mcg into the muscle once a week. Reported on 03/25/2015     DEPO-MEDROL IJ  Inject 1 each as directed daily as needed  (allergic reaction.).     diphenhydrAMINE 50 MG tablet  Commonly known as:  BENADRYL  Take 50 mg by mouth every 4 (four) hours as needed for itching (prn , alternates with hydroxyzine).     EPINEPHrine 0.3 mg/0.3 mL Soaj injection  Commonly known as:  EPIPEN 2-PAK  Inject 0.3 mLs (0.3 mg total) into the muscle as needed (in the event of a severe life-threatening reaction).     estradiol 0.5 MG tablet  Commonly known as:  ESTRACE  Take 1 mg by mouth at bedtime.     EVZIO 0.4 MG/0.4ML Soaj  Generic drug:  Naloxone HCl  AUTO INJECT AS NEEDED IN CASE OF EMERGENCY WHERE GRANDCHILDREN MAY GET A HOLD OF PATIENTS PAIN MEDICATIONS.     fluticasone 50 MCG/ACT nasal spray  Commonly known as:  FLONASE  PLACE 2 SPRAYS INTO BOTH NOSTRILS DAILY.     HYDROmorphone 2 MG tablet  Commonly known as:  DILAUDID  Take 0.5-1 tablets (1-2 mg total) by mouth every 4 (four) hours as needed for moderate pain or severe pain.     hydrOXYzine 25 MG tablet  Commonly known as:  ATARAX/VISTARIL  Take 50 mg by mouth every 4 (four) hours as needed for itching. Alternates with benadryl     ketoconazole 2 % shampoo  Commonly known as:  NIZORAL  APPLICATIONS APPLY ON THE SKIN [place] ONCE WEEKLY.     levothyroxine 50 MCG tablet  Commonly known as:  SYNTHROID, LEVOTHROID  Take 200 mcg by mouth daily. Pt can only take name brand     Magnesium Oxide 500 MG Tabs  Take 500 mg by mouth daily.     mometasone-formoterol 200-5 MCG/ACT Aero  Commonly known as:  DULERA  INHALE TWO PUFFS TWICE DAILY TO PREVENT COUGH OR WHEEZE. RINSE, GARGLE, AND SPIT AFTER USE. USE WITH SPACER.     multivitamin with minerals Tabs tablet  Take 1 tablet by mouth at bedtime.     promethazine 25 MG tablet  Commonly known as:  PHENERGAN  Take 25-50 mg by mouth 2 (two) times daily as needed for nausea or vomiting.     propranolol 20 MG tablet  Commonly known as:  INDERAL  Take 20 mg by mouth 2 (two) times daily.     RECLAST IV  Inject  into the vein as directed. Yearly --  Last dose Oct 2015     topiramate 25 MG tablet  Commonly known as:  TOPAMAX  Take 75 mg by mouth 2 (two) times daily.     UNABLE TO FIND  once a week. Allergy injections - once  a week 2 shots        Diagnostic Studies: Dg Chest 2 View  07/07/2015  CLINICAL DATA:  Preop cervical fusion 07/16/2015. EXAM: CHEST  2 VIEW COMPARISON:  02/25/2015. FINDINGS: Trachea is midline. Surgical clips are seen in the expected location of the thyroid. Heart size normal. Lungs may be mildly hyperinflated but are clear. IMPRESSION: No acute findings. Electronically Signed   By: Lorin Picket M.D.   On: 07/07/2015 17:02   Dg Cervical Spine 1 View  07/16/2015  CLINICAL DATA:  Cervical spine. EXAM: DG C-ARM 61-120 MIN; DG CERVICAL SPINE - 1 VIEW COMPARISON:  06/06/2015. FINDINGS: C4-C5 anterior fusion.  Hardware intact.  Good anatomic alignment. IMPRESSION: C4-C5 anterior fusion with good anatomic alignment. Electronically Signed   By: Marcello Moores  Register   On: 07/16/2015 14:16   Dg C-arm 1-60 Min  07/16/2015  CLINICAL DATA:  Cervical spine. EXAM: DG C-ARM 61-120 MIN; DG CERVICAL SPINE - 1 VIEW COMPARISON:  06/06/2015. FINDINGS: C4-C5 anterior fusion.  Hardware intact.  Good anatomic alignment. IMPRESSION: C4-C5 anterior fusion with good anatomic alignment. Electronically Signed   By: Marcello Moores  Register   On: 07/16/2015 14:16   Mm Screening Breast W/implant Tomo Bilateral  07/15/2015  CLINICAL DATA:  Screening. EXAM: 2D DIGITAL SCREENING BILATERAL MAMMOGRAM WITH IMPLANTS, CAD AND ADJUNCT TOMO The patient has retropectoral implants. Standard and implant displaced views were performed. COMPARISON:  Previous exam(s). ACR Breast Density Category d: The breast tissue is extremely dense, which lowers the sensitivity of mammography. FINDINGS: There are no findings suspicious for malignancy. Images were processed with CAD. IMPRESSION: No mammographic evidence of malignancy. A result letter  of this screening mammogram will be mailed directly to the patient. RECOMMENDATION: Screening mammogram in one year. (Code:SM-B-01Y) BI-RADS CATEGORY  1:  Negative. Electronically Signed   By: Altamese Cabal M.D.   On: 07/15/2015 12:21    Disposition: 01-Home or Self Care   POD #1 s/p C4/5 ACDF, doing very well  - encourage ambulation - Dialudid for pain, Valium for muscle spasms -Written scripts for pain signed and in chart -D/C instructions sheet printed and in chart -D/C today  -F/U in office 2 weeks   Signed: Justice Britain 07/24/2015, 11:45 AM

## 2015-07-28 ENCOUNTER — Ambulatory Visit: Payer: Self-pay | Admitting: Neurology

## 2015-07-28 ENCOUNTER — Ambulatory Visit (INDEPENDENT_AMBULATORY_CARE_PROVIDER_SITE_OTHER): Payer: BLUE CROSS/BLUE SHIELD

## 2015-07-28 ENCOUNTER — Telehealth: Payer: Self-pay

## 2015-07-28 DIAGNOSIS — J309 Allergic rhinitis, unspecified: Secondary | ICD-10-CM | POA: Diagnosis not present

## 2015-07-28 NOTE — Telephone Encounter (Signed)
Patient last seen on 03/25/15 by Dr. Verlin Fester. She has some questions about her inhalers. She doesn't know which one she should be taking.  Please Advise  Thanks

## 2015-07-29 ENCOUNTER — Ambulatory Visit: Payer: Self-pay | Admitting: Neurology

## 2015-07-29 NOTE — Telephone Encounter (Signed)
Spoke with pt she states that dulera helps some, and advair helped some. She had the best control on symbicort. Sh is very confused on her inhalers. I tried to help her understand that she should be on the dulera 2 puffs bid. She states she is using the symbicort samples we had given her a while back. She states she does not have a rescue inhaler only albuterol neb as a as needed. Do you want [place] to call in a as needed, and would you like to try for symbicort again or have her continue on dulera?  Please advise

## 2015-07-30 DIAGNOSIS — M4802 Spinal stenosis, cervical region: Secondary | ICD-10-CM | POA: Diagnosis not present

## 2015-08-04 DIAGNOSIS — Z9889 Other specified postprocedural states: Secondary | ICD-10-CM | POA: Diagnosis not present

## 2015-08-05 NOTE — Telephone Encounter (Signed)
Yes, she does need a prescription for an albuterol HFA inhaler along with instructions for its proper use.   Please explain to her that Symbicort and Kari Green are very similar medications.  If, however, she would prefer Symbicort and her insurance coverage is favorable for Symbicort I be happy to switch her back to this medication.     Thanks.

## 2015-08-06 ENCOUNTER — Other Ambulatory Visit: Payer: Self-pay

## 2015-08-06 DIAGNOSIS — J45901 Unspecified asthma with (acute) exacerbation: Secondary | ICD-10-CM

## 2015-08-06 DIAGNOSIS — B029 Zoster without complications: Secondary | ICD-10-CM | POA: Diagnosis not present

## 2015-08-06 DIAGNOSIS — E538 Deficiency of other specified B group vitamins: Secondary | ICD-10-CM | POA: Diagnosis not present

## 2015-08-06 MED ORDER — ALBUTEROL SULFATE HFA 108 (90 BASE) MCG/ACT IN AERS: 2.0000 | INHALATION_SPRAY | RESPIRATORY_TRACT | Status: DC | PRN

## 2015-08-06 MED ORDER — ALBUTEROL SULFATE (2.5 MG/3ML) 0.083% IN NEBU: 2.5000 mg | INHALATION_SOLUTION | RESPIRATORY_TRACT | Status: DC | PRN

## 2015-08-06 MED ORDER — BUDESONIDE-FORMOTEROL FUMARATE 160-4.5 MCG/ACT IN AERO: 2.0000 | INHALATION_SPRAY | Freq: Two times a day (BID) | RESPIRATORY_TRACT | Status: DC

## 2015-08-06 NOTE — Telephone Encounter (Signed)
Spoke with pt informed her we would send in proair and that we sent in rx for symbicort.  I also informed her if she gets confused about the inhalers again to call us and make an appointment so we can verify everything in person.

## 2015-08-10 DIAGNOSIS — R21 Rash and other nonspecific skin eruption: Secondary | ICD-10-CM | POA: Diagnosis not present

## 2015-08-11 ENCOUNTER — Telehealth: Payer: Self-pay | Admitting: *Deleted

## 2015-08-11 DIAGNOSIS — L259 Unspecified contact dermatitis, unspecified cause: Secondary | ICD-10-CM | POA: Diagnosis not present

## 2015-08-11 NOTE — Telephone Encounter (Signed)
ERROR

## 2015-08-12 DIAGNOSIS — L244 Irritant contact dermatitis due to drugs in contact with skin: Secondary | ICD-10-CM | POA: Diagnosis not present

## 2015-08-18 ENCOUNTER — Telehealth: Payer: Self-pay | Admitting: *Deleted

## 2015-08-18 NOTE — Telephone Encounter (Signed)
She may use Dulera 200/5 g, 2 inhalations via spacer device twice a day.

## 2015-08-18 NOTE — Telephone Encounter (Signed)
Patient insurance will not pay for Symbicort. She has tried [place]. Insurance will cover [employer], Advair or [place]. Please advise.

## 2015-08-19 ENCOUNTER — Other Ambulatory Visit: Payer: Self-pay

## 2015-08-19 MED ORDER — MOMETASONE FURO-FORMOTEROL FUM 200-5 MCG/ACT IN AERO: 2.0000 | INHALATION_SPRAY | Freq: Two times a day (BID) | RESPIRATORY_TRACT | Status: DC

## 2015-08-19 NOTE — Telephone Encounter (Signed)
Sent in rx and left message for pt to call us back

## 2015-08-20 ENCOUNTER — Encounter: Payer: Self-pay | Admitting: Neurology

## 2015-08-29 DIAGNOSIS — M4802 Spinal stenosis, cervical region: Secondary | ICD-10-CM | POA: Diagnosis not present

## 2015-09-01 DIAGNOSIS — Z0001 Encounter for general adult medical examination with abnormal findings: Secondary | ICD-10-CM | POA: Diagnosis not present

## 2015-09-01 DIAGNOSIS — E559 Vitamin D deficiency, unspecified: Secondary | ICD-10-CM | POA: Diagnosis not present

## 2015-09-01 DIAGNOSIS — E538 Deficiency of other specified B group vitamins: Secondary | ICD-10-CM | POA: Diagnosis not present

## 2015-09-01 DIAGNOSIS — M810 Age-related osteoporosis without current pathological fracture: Secondary | ICD-10-CM | POA: Diagnosis not present

## 2015-09-08 ENCOUNTER — Other Ambulatory Visit: Payer: Self-pay

## 2015-09-08 MED ORDER — SUMATRIPTAN SUCCINATE 100 MG PO TABS: ORAL_TABLET | ORAL | Status: DC

## 2015-09-09 DIAGNOSIS — F325 Major depressive disorder, single episode, in full remission: Secondary | ICD-10-CM | POA: Diagnosis not present

## 2015-09-09 DIAGNOSIS — E039 Hypothyroidism, unspecified: Secondary | ICD-10-CM | POA: Diagnosis not present

## 2015-09-09 DIAGNOSIS — Z0001 Encounter for general adult medical examination with abnormal findings: Secondary | ICD-10-CM | POA: Diagnosis not present

## 2015-09-09 DIAGNOSIS — R042 Hemoptysis: Secondary | ICD-10-CM | POA: Diagnosis not present

## 2015-09-09 DIAGNOSIS — J9811 Atelectasis: Secondary | ICD-10-CM | POA: Diagnosis not present

## 2015-09-09 DIAGNOSIS — M810 Age-related osteoporosis without current pathological fracture: Secondary | ICD-10-CM | POA: Diagnosis not present

## 2015-09-11 ENCOUNTER — Institutional Professional Consult (permissible substitution): Payer: Self-pay | Admitting: Pulmonary Disease

## 2015-09-12 ENCOUNTER — Telehealth: Payer: Self-pay | Admitting: Neurology

## 2015-09-12 MED ORDER — SUMATRIPTAN SUCCINATE 6 MG/0.5ML ~~LOC~~ SOAJ: 6.0000 mg | SUBCUTANEOUS | Status: DC

## 2015-09-12 NOTE — Telephone Encounter (Signed)
Spoke with patient. Pt states she is still suffering from the same migraine that she's had for a while now. Pt states she picked up her sumatriptan 100 mg on Monday, but it's not helping. Pt states she feels pharmacy fill RX wrong, as medication has always help in the past. Made pt read label, sig, dose, etc off bottle to me. All information was written on bottle properly. Asked patient if she had tried to take an Aleve with abortive therapy, pt denied. Stated she is not allowed to take NSAIDs for another 6 weeks due to recent surgery. Advised that injection is an NSAID pt stated surgeon told her oral NSAIDs not okay because she would have to take multiple and keep taking them, but injection normally helped first time so it would be fine. Pt wants to come in on Monday for a headache cocktail as she has no driver today. Please advise.

## 2015-09-12 NOTE — Telephone Encounter (Signed)
PT states she is allergic to steroids. Pt has Corticosteroids listed under allergies with a swelling reaction. Please advise.

## 2015-09-12 NOTE — Telephone Encounter (Signed)
VM Left for patient to return call to office.

## 2015-09-12 NOTE — Telephone Encounter (Signed)
The earlier the headache is treated, the better.  If she cannot come today or go to the ED during the weekend, she can come in for a cocktail.  Alternatively, if this headache has been ongoing for a week, we can prescribe her a prednisone 10mg  tablet taper (Take 6tabs x1day, then 5tabs x1day, then 4tabs x1day, then 3tabs x1day, then 2tabs x1day, then 1tab x1day, then STOP).

## 2015-09-12 NOTE — Telephone Encounter (Signed)
Rx sent in. Pt aware. Will call back Monday if sumatriptan injectable not helpful

## 2015-09-12 NOTE — Telephone Encounter (Signed)
Kari Green 09/01/1965. She wants you to please call her regarding her migraine. She would like to come in on Monday for a headache cocktail. Her number is [phone] Thank you

## 2015-09-12 NOTE — Telephone Encounter (Signed)
If the tablet is not working, we can prescribe her the 6mg  injection (1 injection and may repeat once in 1 hour if needed, no more than 2 injections in 24 hours)

## 2015-09-16 ENCOUNTER — Ambulatory Visit: Payer: Self-pay | Admitting: Neurology

## 2015-09-18 ENCOUNTER — Ambulatory Visit (INDEPENDENT_AMBULATORY_CARE_PROVIDER_SITE_OTHER): Payer: BLUE CROSS/BLUE SHIELD | Admitting: Neurology

## 2015-09-18 DIAGNOSIS — H538 Other visual disturbances: Secondary | ICD-10-CM | POA: Diagnosis not present

## 2015-09-18 DIAGNOSIS — G43011 Migraine without aura, intractable, with status migrainosus: Secondary | ICD-10-CM | POA: Diagnosis not present

## 2015-09-18 MED ORDER — ONABOTULINUMTOXINA 100 UNITS IJ SOLR
155.0000 [IU] | Freq: Once | INTRAMUSCULAR | Status: AC
  Administered 2015-09-18: 155 [IU] via INTRAMUSCULAR

## 2015-09-18 NOTE — Procedures (Deleted)
[facility]   Procedure Note Botox  Attending: Dr. Tomi Likens  Preoperative Diagnosis(es): Chronic migraine  Consent obtained from: {PMR Consent:[id number]} Benefits discussed included, but were not limited to decreased muscle tightness, increased joint range of motion, and decreased pain.  Risk discussed included, but were not limited pain and discomfort, bleeding, bruising, excessive weakness, venous thrombosis, muscle atrophy and dysphagia.  Anticipated outcomes of the procedure as well as he risks and benefits of the alternatives to the procedure, and the roles and tasks of the personnel to be involved, were discussed with the patient, and the patient consents to the procedure and agrees to proceed. A copy of the patient medication guide was given to the patient which explains the blackbox warning.  Patients identity and treatment sites confirmed {yes no:[id number]}.  Details of Procedure: Skin was cleaned with alcohol. Prior to injection, the needle plunger was aspirated to make sure the needle was not within a blood vessel.  There was no blood retrieved on aspiration.    Following is a summary of the muscles injected  And the amount of Botulinum toxin used:  Dilution 200 units of Botox was reconstituted with 4 ml of preservative free normal saline. Time of reconstitution: At the time of the office visit (<30 minutes prior to injection)   Injections  155 total units of Botox was injected with a 30 gauge needle.  Injection Sites: L occipitalis: 15 units- 3 sites  R occiptalis: 15 units- 3 sites  L upper trapezius: 15 units- 3 sites R upper trapezius: 15 units- 3 sits          L paraspinal: 10 units- 2 sites R paraspinal: 10 units- 2 sites  Face L frontalis(2 injection sites):10 units   R frontalis(2 injection sites):10 units         L corrugator: 5 units   R corrugator: 5 units           Procerus: 5 units   L temporalis: 20 units R temporalis: 20 units   Agent:  200 units of  botulinum Type A (Onobotulinum Toxin type A) was reconstituted with 4 ml of preservative free normal saline.  Time of reconstitution: At the time of the office visit (<30 minutes prior to injection)     Total injected (Units): 155  Total wasted (Units): 5  Patient tolerated procedure well without complications.   Reinjection is anticipated in 3 months.

## 2015-09-18 NOTE — Procedures (Signed)
[facility]   Procedure Note Botox  Attending: Dr. Tomi Likens  Preoperative Diagnosis(es): Chronic migraine  Consent obtained from: The patient Benefits discussed included, but were not limited to decreased muscle tightness, increased joint range of motion, and decreased pain.  Risk discussed included, but were not limited pain and discomfort, bleeding, bruising, excessive weakness, venous thrombosis, muscle atrophy and dysphagia.  Anticipated outcomes of the procedure as well as he risks and benefits of the alternatives to the procedure, and the roles and tasks of the personnel to be involved, were discussed with the patient, and the patient consents to the procedure and agrees to proceed. A copy of the patient medication guide was given to the patient which explains the blackbox warning.  Patients identity and treatment sites confirmed Yes.  .  Details of Procedure: Skin was cleaned with alcohol. Prior to injection, the needle plunger was aspirated to make sure the needle was not within a blood vessel.  There was no blood retrieved on aspiration.    Following is a summary of the muscles injected  And the amount of Botulinum toxin used:  Dilution 200 units of Botox was reconstituted with 4 ml of preservative free normal saline. Time of reconstitution: At the time of the office visit (<30 minutes prior to injection)   Injections  155 total units of Botox was injected with a 30 gauge needle.  Injection Sites: L occipitalis: 15 units- 3 sites  R occiptalis: 15 units- 3 sites  L upper trapezius: 15 units- 3 sites R upper trapezius: 15 units- 3 sits          L paraspinal (___mid__level): 10 units- 2 sites R paraspinal (__mid___level): 10 units- 2 sites  Face L frontalis(2 injection sites):10 units   R frontalis(2 injection sites):10 units         L corrugator: 5 units   R corrugator: 5 units           Procerus: 5 units   L temporalis: 20 units R temporalis: 20 units   Agent:   200 units of botulinum Type A (Onobotulinum Toxin type A) was reconstituted with 4 ml of preservative free normal saline.  Time of reconstitution: At the time of the office visit (<30 minutes prior to injection)     Total injected (Units): 155  Total wasted (Units): 5  Patient tolerated procedure well without complications.   Reinjection is anticipated in 3 months.  Metta Clines, DO

## 2015-09-18 NOTE — Progress Notes (Signed)
See procedure note.

## 2015-09-23 ENCOUNTER — Ambulatory Visit (INDEPENDENT_AMBULATORY_CARE_PROVIDER_SITE_OTHER): Payer: BLUE CROSS/BLUE SHIELD

## 2015-09-23 DIAGNOSIS — E538 Deficiency of other specified B group vitamins: Secondary | ICD-10-CM | POA: Diagnosis not present

## 2015-09-23 DIAGNOSIS — J309 Allergic rhinitis, unspecified: Secondary | ICD-10-CM | POA: Diagnosis not present

## 2015-09-29 ENCOUNTER — Ambulatory Visit (INDEPENDENT_AMBULATORY_CARE_PROVIDER_SITE_OTHER): Payer: BLUE CROSS/BLUE SHIELD | Admitting: *Deleted

## 2015-09-29 DIAGNOSIS — E538 Deficiency of other specified B group vitamins: Secondary | ICD-10-CM | POA: Diagnosis not present

## 2015-09-29 DIAGNOSIS — J309 Allergic rhinitis, unspecified: Secondary | ICD-10-CM

## 2015-10-16 ENCOUNTER — Ambulatory Visit (INDEPENDENT_AMBULATORY_CARE_PROVIDER_SITE_OTHER): Payer: BLUE CROSS/BLUE SHIELD

## 2015-10-16 ENCOUNTER — Encounter: Payer: Self-pay | Admitting: *Deleted

## 2015-10-16 ENCOUNTER — Ambulatory Visit (INDEPENDENT_AMBULATORY_CARE_PROVIDER_SITE_OTHER)
Admission: RE | Admit: 2015-10-16 | Discharge: 2015-10-16 | Disposition: A | Discharge status: Home / Self Care | Payer: BLUE CROSS/BLUE SHIELD | Source: Ambulatory Visit | Attending: Emergency Medicine | Admitting: Emergency Medicine

## 2015-10-16 ENCOUNTER — Ambulatory Visit (INDEPENDENT_AMBULATORY_CARE_PROVIDER_SITE_OTHER): Payer: BLUE CROSS/BLUE SHIELD | Admitting: Emergency Medicine

## 2015-10-16 VITALS — BP 114/82 | HR 82 | Ht 63.0 in | Wt 129.0 lb

## 2015-10-16 DIAGNOSIS — J309 Allergic rhinitis, unspecified: Secondary | ICD-10-CM | POA: Diagnosis not present

## 2015-10-16 DIAGNOSIS — R05 Cough: Secondary | ICD-10-CM

## 2015-10-16 DIAGNOSIS — R042 Hemoptysis: Secondary | ICD-10-CM | POA: Diagnosis not present

## 2015-10-16 DIAGNOSIS — J4530 Mild persistent asthma, uncomplicated: Secondary | ICD-10-CM | POA: Diagnosis not present

## 2015-10-16 DIAGNOSIS — R059 Cough, unspecified: Secondary | ICD-10-CM | POA: Insufficient documentation

## 2015-10-16 DIAGNOSIS — R0602 Shortness of breath: Secondary | ICD-10-CM | POA: Diagnosis not present

## 2015-10-16 DIAGNOSIS — R053 Chronic cough: Secondary | ICD-10-CM

## 2015-10-16 NOTE — Progress Notes (Signed)
Subjective:    Patient ID: Kari Green, female    DOB: 1965/05/06, 50 y.o.   MRN: [id number]  HPI 50 year old former smoker ( pack years), History of migraine headaches, hypothyroidism, hyperlipidemia, chronic pancreatitis, Irritable bowel syndrome, fibromyalgia and chronic pain. Carries a diagnosis of asthma made in her 41's, allergies on immunotherapy.  Has been started on LABA /ICS (she has received samples, sounds like she is current on Symbicort although she is unsure).  She is referred today by Dr Roseanne Reno for evaluation of chronic cough and hemoptysis. She tells me that she developed non-productive cough over a year ago. Typically non-productive but on a few occasions has had blood in her sputum. Last time she saw any blood was a week ago. She frequently has epistaxis "every day".  She has bleeding from her teeth when she brushes. She is on flonase, benadryl.  She has been tested remotely for GERD, does not have GERD sx.   Studies:  Negative cardiac stress test 05/08/15 CT abdomen 06/06/15 - 4 mm non-obstructing upper pole right renal calculus   Review of Systems  Constitutional: Positive for appetite change.  HENT: Positive for congestion, dental problem, sneezing, sore throat and trouble swallowing.   Respiratory: Positive for cough and shortness of breath.   Cardiovascular: Positive for chest pain.  Gastrointestinal: Positive for abdominal pain.    Past Medical History:  Diagnosis Date  . Anxiety   . Arthritis    right thumb  . Asthma   . Cancer [facility])    history of skin cancer  . Chronic, continuous use of opioids   . Cognitive dysfunction    patient denies  . Complication of anesthesia    "not asleep and aware of everything"  . Degenerative disc disease, cervical   . Degenerative disc disease, lumbar   . Delayed gastric emptying   . Dental crowns present   . Difficult intravenous access   . Difficulty swallowing pills   . Esophageal dysmotility   . Family  history of adverse reaction to anesthesia    family - itching, hives, PONV  . Fibromyalgia   . Hemangioma of liver   . Hemangioma, renal   . History of bronchitis   . History of hiatal hernia   . History of kidney stones 04/2014  . History of pneumonia   . Hypertension   . Hypothyroidism (acquired)   . IC (interstitial cystitis)   . Irritable bowel syndrome (IBS)   . Kidney stone   . Knee pain, right    states gets "nerves burned" periodically  . Limited joint range of motion    right knee - unable to fully extend right leg  . Memory impairment    due to pain   . Migraines   . Osteoporosis   . Pain management   . Pain syndrome, chronic   . PONV (postoperative nausea and vomiting)    uncontrollable itching with anesthesia; hx. of anesthesia awareness  . PONV (postoperative nausea and vomiting)    "need medication before and need medication afterwards"  . Sciatica    bilateral  . Seasonal allergies      Family History  Problem Relation Age of Onset  . Anesthesia problems Mother   . Cancer Mother     ovarian  . Leukemia Mother   . Cancer Daughter     cervical  . Cancer Maternal Aunt     breast     Social History   Social History  . Marital  status: Married    Spouse name: N/A  . Number of children: N/A  . Years of education: N/A   Occupational History  . Not on file.   Social History Main Topics  . Smoking status: Former Smoker    Quit date: 02/22/2006  . Smokeless tobacco: Never Used  . Alcohol use 0.0 oz/week     Comment: rarely  . Drug use: No  . Sexual activity: Not on file   Other Topics Concern  . Not on file   Social History Narrative   Lives with husband.       Allergies  Allergen Reactions  . Cobalt Hives, Swelling and Other (See Comments)    DESTRUCTION OF BONE  . Latex Hives and Swelling    TIGHTNESS IN THROAT  . Nickel Hives, Swelling and Other (See Comments)    DESTRUCTION OF BONE  . Peanuts [Peanut Oil] Hives, Swelling and Other  (See Comments)    TIGHTNESS IN THROAT  . Red Dye Hives and Itching  . Azithromycin Swelling    Undefined area  . Chloraprep One Step [Chlorhexidine Gluconate] Other (See Comments)    CHEMICAL BURN, BLISTERS  . Corticosteroids Swelling  . Eggs Or Egg-Derived Products Hives and Itching  . Flexeril [Cyclobenzaprine] Swelling    Undefined area  . Morphine And Related Hives and Itching    ALL PAIN MEDS - MUST HAVE BENADRYL PRIOR TO PAIN MED.  . Other Hives and Other (See Comments)    Other reaction(s): Other Burns skin ALL METALS - HIVES, SWELLING Over the counter cough syrup- hives.  Antibiotic given for tick bites- swelling, hives, rash LOTIONS - HIVES (DUE TO THE DYE) FRAGRANCES - CAUSES MIGRAINES  . Percocet [Oxycodone-Acetaminophen] Swelling    Undefined area  . Supartz [Sodium Hyaluronate] Hives and Swelling  . Surgical Lubricant Swelling  . Yellow Dyes (Non-Tartrazine) Hives       . Prednisone Other (See Comments)  . Soap Itching    DIAL, DOVE  . Tape Other (See Comments)    BURNS SKIN     Outpatient Medications Prior to Visit  Medication Sig Dispense Refill  . albuterol (PROAIR HFA) 108 (90 Base) MCG/ACT inhaler Inhale 2 puffs into the lungs every 4 (four) hours as needed for wheezing or shortness of breath. 1 Inhaler 1  . albuterol (PROVENTIL) (2.5 MG/3ML) 0.083% nebulizer solution Take 3 mLs (2.5 mg total) by nebulization every 4 (four) hours as needed for wheezing (or coughing spells). 75 vial 1  . ALPRAZolam (XANAX) 0.5 MG tablet Take 1 mg by mouth 3 (three) times daily as needed. Anxiety    . budesonide-formoterol (SYMBICORT) 160-4.5 MCG/ACT inhaler Inhale 2 puffs into the lungs 2 (two) times daily. 1 Inhaler 5  . cyanocobalamin (,VITAMIN B-12,) 1000 MCG/ML injection Inject 1,000 mcg into the muscle once a week. Reported on 03/25/2015    . diphenhydrAMINE (BENADRYL) 50 MG tablet Take 50 mg by mouth every 4 (four) hours as needed for itching (prn , alternates with  hydroxyzine).     Marland Kitchen EPINEPHrine (EPIPEN 2-PAK) 0.3 mg/0.3 mL IJ SOAJ injection Inject 0.3 mLs (0.3 mg total) into the muscle as needed (in the event of a severe life-threatening reaction). 2 Device 2  . estradiol (ESTRACE) 0.5 MG tablet Take 1 mg by mouth at bedtime.     Marland Kitchen EVZIO 0.4 MG/0.4ML SOAJ AUTO INJECT AS NEEDED IN CASE OF EMERGENCY WHERE GRANDCHILDREN MAY GET A HOLD OF PATIENTS PAIN MEDICATIONS.  0  . fluticasone (FLONASE) 50  MCG/ACT nasal spray PLACE 2 SPRAYS INTO BOTH NOSTRILS DAILY. 16 g 5  . HYDROmorphone (DILAUDID) 2 MG tablet Take 0.5-1 tablets (1-2 mg total) by mouth every 4 (four) hours as needed for moderate pain or severe pain. 30 tablet 0  . hydrOXYzine (ATARAX/VISTARIL) 25 MG tablet Take 50 mg by mouth every 4 (four) hours as needed for itching. Alternates with benadryl    . ketoconazole (NIZORAL) 2 % shampoo APPLICATIONS APPLY ON THE SKIN WASH ONCE WEEKLY.  3  . levothyroxine (SYNTHROID, LEVOTHROID) 50 MCG tablet Take 200 mcg by mouth daily. Pt can only take name brand    . MethylPREDNISolone Acetate (DEPO-MEDROL IJ) Inject 1 each as directed daily as needed (allergic reaction.).    Marland Kitchen mometasone-formoterol (DULERA) 200-5 MCG/ACT AERO Inhale 2 puffs into the lungs 2 (two) times daily. 13 g 5  . Multiple Vitamin (MULTIVITAMIN WITH MINERALS) TABS tablet Take 1 tablet by mouth at bedtime.    . OnabotulinumtoxinA (BOTOX IJ) Inject as directed every 3 (three) months. For migraines    . promethazine (PHENERGAN) 25 MG tablet Take 25-50 mg by mouth 2 (two) times daily as needed for nausea or vomiting.     . propranolol (INDERAL) 20 MG tablet Take 20 mg by mouth 2 (two) times daily.    . SUMAtriptan (IMITREX) 100 MG tablet Take 1 tablet at earliest onset of headache.  May repeat once in 2 hours if headache persists or recurs.  Do not exceed 2 tabs in 24 hours 10 tablet 2  . SUMAtriptan 6 MG/0.5ML SOAJ Inject 6 mg into the skin as directed. 3 mL 4  . topiramate (TOPAMAX) 25 MG tablet Take  75 mg by mouth 2 (two) times daily.  1  . UNABLE TO FIND once a week. Allergy injections - once  a week 2 shots    . Zoledronic Acid (RECLAST IV) Inject into the vein as directed. Yearly --  Last dose Oct 2015    . Magnesium Oxide 500 MG TABS Take 500 mg by mouth daily.    . mometasone-formoterol (DULERA) 200-5 MCG/ACT AERO INHALE TWO PUFFS TWICE DAILY TO PREVENT COUGH OR WHEEZE. RINSE, GARGLE, AND SPIT AFTER USE. USE WITH SPACER. 13 g 5   Facility-Administered Medications Prior to Visit  Medication Dose Route Frequency Provider Last Rate Last Dose  . ropivacaine (PF) 2 mg/ml (0.2%) (NAROPIN) epidural 9 mL  9 mL Other Once Milinda Pointer, MD             Objective:   Physical Exam Vitals:   10/16/15 1441  BP: 114/82  Pulse: 82  SpO2: 98%  Weight: 129 lb (58.5 kg)  Height: 5\' 3"  (1.6 m)   Gen: Pleasant, well-nourished, in no distress,  normal affect  ENT: No lesions,  mouth clear,  oropharynx clear, no postnasal drip  Neck: No JVD, no TMG, no carotid bruits  Lungs: No use of accessory muscles, clear without rales or rhonchi  Cardiovascular: RRR, heart sounds normal, no murmur or gallops, no peripheral edema  Musculoskeletal: No deformities, no cyanosis or clubbing, scar R knee  Neuro: alert, non focal  Skin: Warm, no lesions or rashes      Assessment & Plan:  Hemoptysis Etiology unclear. Suspect that this is due to her chronic nonproductive harsh cough with some associated Inflammatory injury and bleeding. Must consider endobronchial lesion. Finally also consider possible compounding issue such is her epistaxis and gingival bleeding. She needs chest x-ray now to compare with her priors. We will  set up bronchoscopy To inspect her posterior pharynx and airways.  Asthma She needs pulmonary function testing to confirm this diagnosis. Given her cough which appears to be her main complaint I like to stop her scheduled bronchodilator (she believes its Symbicort). Keep  albuterol available to use when necessary  Chronic cough Suspect contribution of her chronic rhinitis. Also consider possible influence of her scheduled LABA/ICS. I will stop her scheduled bronchodilator, continue to push for good control of her rhinitis. She may have GERD although she doesn't experience any symptoms. It may be reasonable to perform an empiric trial of a PPI at some point in the future. She says that she's been on a PPI in the past but not in this setting. Bronchoscopy scheduled as above  Baltazar Apo, MD, PhD 10/16/2015, 5:02 PM [facility] [phone] or if no answer [phone]

## 2015-10-16 NOTE — Assessment & Plan Note (Signed)
Etiology unclear. Suspect that this is due to her chronic nonproductive harsh cough with some associated Inflammatory injury and bleeding. Must consider endobronchial lesion. Finally also consider possible compounding issue such is her epistaxis and gingival bleeding. She needs chest x-ray now to compare with her priors. We will set up bronchoscopy To inspect her posterior pharynx and airways.

## 2015-10-16 NOTE — Assessment & Plan Note (Signed)
Suspect contribution of her chronic rhinitis. Also consider possible influence of her scheduled LABA/ICS. I will stop her scheduled bronchodilator, continue to push for good control of her rhinitis. She may have GERD although she doesn't experience any symptoms. It may be reasonable to perform an empiric trial of a PPI at some point in the future. She says that she's been on a PPI in the past but not in this setting. Bronchoscopy scheduled as above

## 2015-10-16 NOTE — Patient Instructions (Addendum)
We will perform a CXR We will arrange for a bronchoscopy to evaluate your airways We will perform full pulmonary function testing Stop Symbicort for now.  Take albuterol 2 puffs up to every 4 hours if needed for shortness of breath.  Continue flonase and benadryl Follow with Dr Lamonte Sakai next available after your testing

## 2015-10-16 NOTE — Assessment & Plan Note (Signed)
She needs pulmonary function testing to confirm this diagnosis. Given her cough which appears to be her main complaint I like to stop her scheduled bronchodilator (she believes its Symbicort). Keep albuterol available to use when necessary

## 2015-10-17 ENCOUNTER — Telehealth: Payer: Self-pay | Admitting: Emergency Medicine

## 2015-10-17 NOTE — Telephone Encounter (Signed)
Called spoke with pt. She states that she was told by xray tech downstairs that she would receive xray results today by RB. I explained to her that we have not received his results and recs and that I would send a message and return her call once we received his recs. She also states that RB requested that she be seen before his vacation. I explained to her that he has no availability and I scheduled her for 12/05/15 with RB. I informed her that I would send a message to [place] for verification that appointment date was appropriate. She voiced understanding and had no further questions.   RB please advise on cxr and appointment

## 2015-10-20 ENCOUNTER — Ambulatory Visit (HOSPITAL_COMMUNITY)
Admission: RE | Admit: 2015-10-20 | Discharge: 2015-10-20 | Disposition: A | Discharge status: Home / Self Care | Payer: BLUE CROSS/BLUE SHIELD | Source: Ambulatory Visit | Attending: Emergency Medicine | Admitting: Emergency Medicine

## 2015-10-20 ENCOUNTER — Encounter (HOSPITAL_COMMUNITY): Payer: Self-pay

## 2015-10-20 ENCOUNTER — Encounter (HOSPITAL_COMMUNITY)
Admission: RE | Disposition: A | Discharge status: Home / Self Care | Payer: Self-pay | Source: Ambulatory Visit | Attending: Emergency Medicine

## 2015-10-20 ENCOUNTER — Encounter (HOSPITAL_COMMUNITY): Payer: Self-pay | Admitting: Radiology

## 2015-10-20 DIAGNOSIS — I10 Essential (primary) hypertension: Secondary | ICD-10-CM | POA: Diagnosis not present

## 2015-10-20 DIAGNOSIS — J387 Other diseases of larynx: Secondary | ICD-10-CM | POA: Insufficient documentation

## 2015-10-20 DIAGNOSIS — R040 Epistaxis: Secondary | ICD-10-CM | POA: Insufficient documentation

## 2015-10-20 DIAGNOSIS — Z7983 Long term (current) use of bisphosphonates: Secondary | ICD-10-CM | POA: Diagnosis not present

## 2015-10-20 DIAGNOSIS — Z79890 Hormone replacement therapy (postmenopausal): Secondary | ICD-10-CM | POA: Diagnosis not present

## 2015-10-20 DIAGNOSIS — Z7951 Long term (current) use of inhaled steroids: Secondary | ICD-10-CM | POA: Insufficient documentation

## 2015-10-20 DIAGNOSIS — Z79899 Other long term (current) drug therapy: Secondary | ICD-10-CM | POA: Diagnosis not present

## 2015-10-20 DIAGNOSIS — E785 Hyperlipidemia, unspecified: Secondary | ICD-10-CM | POA: Insufficient documentation

## 2015-10-20 DIAGNOSIS — F419 Anxiety disorder, unspecified: Secondary | ICD-10-CM | POA: Insufficient documentation

## 2015-10-20 DIAGNOSIS — Z87891 Personal history of nicotine dependence: Secondary | ICD-10-CM | POA: Insufficient documentation

## 2015-10-20 DIAGNOSIS — R05 Cough: Secondary | ICD-10-CM | POA: Diagnosis not present

## 2015-10-20 DIAGNOSIS — K068 Other specified disorders of gingiva and edentulous alveolar ridge: Secondary | ICD-10-CM | POA: Insufficient documentation

## 2015-10-20 DIAGNOSIS — E039 Hypothyroidism, unspecified: Secondary | ICD-10-CM | POA: Insufficient documentation

## 2015-10-20 DIAGNOSIS — R042 Hemoptysis: Secondary | ICD-10-CM | POA: Insufficient documentation

## 2015-10-20 DIAGNOSIS — G43909 Migraine, unspecified, not intractable, without status migrainosus: Secondary | ICD-10-CM | POA: Diagnosis not present

## 2015-10-20 DIAGNOSIS — J45909 Unspecified asthma, uncomplicated: Secondary | ICD-10-CM | POA: Insufficient documentation

## 2015-10-20 HISTORY — PX: VIDEO BRONCHOSCOPY: SHX5072

## 2015-10-20 LAB — BODY FLUID CELL COUNT WITH DIFFERENTIAL
EOS FL: 1 %
Lymphs, Fluid: 8 %
Monocyte-Macrophage-Serous Fluid: 33 % — ABNORMAL LOW (ref 50–90)
NEUTROPHIL FLUID: 58 % — AB (ref 0–25)
WBC FLUID: 12 uL (ref 0–1000)

## 2015-10-20 SURGERY — VIDEO BRONCHOSCOPY WITHOUT FLUORO
Anesthesia: Moderate Sedation | Laterality: Bilateral

## 2015-10-20 MED ORDER — BUTAMBEN-TETRACAINE-BENZOCAINE 2-2-14 % EX AERO
INHALATION_SPRAY | CUTANEOUS | Status: DC | PRN
  Administered 2015-10-20: 1 via TOPICAL

## 2015-10-20 MED ORDER — DIPHENHYDRAMINE HCL 50 MG/ML IJ SOLN: 50.0000 mg | Freq: Once | INTRAMUSCULAR | Status: DC

## 2015-10-20 MED ORDER — MIDAZOLAM HCL 10 MG/2ML IJ SOLN
INTRAMUSCULAR | Status: DC | PRN
  Administered 2015-10-20: 3 mg via INTRAVENOUS
  Administered 2015-10-20: 1 mg via INTRAVENOUS
  Administered 2015-10-20: 2 mg via INTRAVENOUS

## 2015-10-20 MED ORDER — FENTANYL CITRATE (PF) 100 MCG/2ML IJ SOLN
INTRAMUSCULAR | Status: AC
Start: 2015-10-20 — End: 2015-10-20
  Filled 2015-10-20: qty 4

## 2015-10-20 MED ORDER — DIPHENHYDRAMINE HCL 50 MG/ML IJ SOLN
INTRAMUSCULAR | Status: AC
Start: 2015-10-20 — End: 2015-10-20
  Filled 2015-10-20: qty 1

## 2015-10-20 MED ORDER — PROMETHAZINE HCL 25 MG/ML IJ SOLN
INTRAMUSCULAR | Status: AC
  Filled 2015-10-20: qty 1

## 2015-10-20 MED ORDER — PHENYLEPHRINE HCL 0.5 % NA SOLN
NASAL | Status: DC | PRN
Start: 2015-10-20 — End: 2015-10-20
  Administered 2015-10-20: 2 [drp] via NASAL

## 2015-10-20 MED ORDER — FENTANYL CITRATE (PF) 100 MCG/2ML IJ SOLN
INTRAMUSCULAR | Status: DC | PRN
  Administered 2015-10-20: 75 ug via INTRAVENOUS
  Administered 2015-10-20 (×2): 25 ug via INTRAVENOUS

## 2015-10-20 MED ORDER — MIDAZOLAM HCL 5 MG/ML IJ SOLN
INTRAMUSCULAR | Status: AC
  Filled 2015-10-20: qty 2

## 2015-10-20 MED ORDER — PROMETHAZINE HCL 25 MG/ML IJ SOLN: 25.0000 mg | Freq: Once | INTRAMUSCULAR | Status: DC

## 2015-10-20 MED ORDER — LIDOCAINE HCL 2 % EX GEL
CUTANEOUS | Status: DC | PRN
  Administered 2015-10-20: 1 via TOPICAL

## 2015-10-20 MED ORDER — SODIUM CHLORIDE 0.9 % IV SOLN
INTRAVENOUS | Status: DC
  Administered 2015-10-20: 14:00:00 via INTRAVENOUS

## 2015-10-20 MED ORDER — LIDOCAINE VISCOUS 2 % MT SOLN: OROMUCOSAL | Status: DC | PRN

## 2015-10-20 MED ORDER — LIDOCAINE HCL (PF) 1 % IJ SOLN
INTRAMUSCULAR | Status: DC | PRN
Start: 2015-10-20 — End: 2015-10-20
  Administered 2015-10-20: 5 mL

## 2015-10-20 NOTE — Progress Notes (Signed)
10/20/2015- Respiratory care note- Video bronch performed with Dr Lamonte Sakai  One intervention of bronchial washing performed.   Pt tolerated well.  No complications noted.  Discharge instructions reviewed with husband and patient.

## 2015-10-20 NOTE — Interval H&P Note (Signed)
PCCM Interval Note  Pt presents for further evaluation of her cough and hx hemoptysis.  She has multiple allergies as detailed in chart. She does indicate that she has tolerated conscious sedation, fentanyl and versed before but that she has typically needed to be premedicated with benadryl 50mg  as well as phenergan.   Vitals:   10/20/15 1320  Temp: 97.6 F (36.4 C)  TempSrc: Oral  Weight: 57.2 kg (126 lb)   Gen: Pleasant, quite anxious, in no distress,  normal affect  ENT: No lesions,  mouth clear,  oropharynx clear, no postnasal drip  Neck: No JVD, no TMG, no carotid bruits  Lungs: No use of accessory muscles, clear without rales or rhonchi  Cardiovascular: RRR, heart sounds normal, no murmur or gallops, no peripheral edema  Abdomen: soft and NT, no HSM,  BS normal  Musculoskeletal: No deformities, no cyanosis or clubbing  Neuro: alert, non focal  Skin: Warm, no lesions or rashes   10/16/15 --  COMPARISON:  Chest x-ray of 09/09/2015  FINDINGS: No active infiltrate or effusion is seen. Mediastinal and hilar contours are unremarkable. The heart is within normal limits in size. A lower anterior cervical spine fusion plate is present and surgical clips from prior thyroidectomy. No bony abnormality is seen.  IMPRESSION: No active cardiopulmonary disease.   Plans:  FOB for airway inspection and BAL   Baltazar Apo, MD, PhD 10/20/2015, 2:13 PM [facility] [phone] or if no answer [phone]

## 2015-10-20 NOTE — Op Note (Signed)
[facility] Patient Name: Kari Green P[place] Date: 10/20/2015 MRN: [id number] Attending MD: Collene Gobble , MD Date of Birth: 1965-07-08 CSN: Finalized Age: 50 Admit Type: Outpatient Gender: Female Procedure:            Bronchoscopy Indications:          Hemoptysis with normal CXR, Chronic cough with normal                        chest X-ray Providers:            Collene Gobble, MD, Doris Cheadle RRT,RCP, Tammie                        Readling RRT,RCP Referring MD:          Medicines:            Lidocaine applied to nares and subglottic space,                        Midazolam 6 mg IV, Fentanyl 125 mcg IV, Oxygen 4 L/min,                        benadryl 50mg  IV x 1, phenergan 25mg  IV x 1 Complications:        No immediate complications Estimated Blood Loss: Estimated blood loss: none. Procedure:            Pre-Anesthesia Assessment:                       - A History and Physical has been performed. Patient                        meds and allergies have been reviewed. The risks and                        benefits of the procedure and the sedation options and                        risks were discussed with the patient. All questions                        were answered and informed consent was obtained.                        Patient identification and proposed procedure were                        verified prior to the procedure by the physician in the                        procedure room. Mental Status Examination: alert and                        oriented. Airway Examination: normal oropharyngeal                        airway. Respiratory Examination: clear to auscultation.                        CV Examination: normal. ASA Grade Assessment: I -  A                        normal healthy patient. After reviewing the risks and                        benefits, the patient was deemed in satisfactory                        condition to undergo the  procedure. The anesthesia plan                        was to use moderate sedation / analgesia (conscious                        sedation). Immediately prior to administration of                        medications, the patient was re-assessed for adequacy                        to receive sedatives. The heart rate, respiratory rate,                        oxygen saturations, blood pressure, adequacy of                        pulmonary ventilation, and response to care were                        monitored throughout the procedure. The physical status                        of the patient was re-assessed after the procedure.                       After obtaining informed consent, the bronchoscope was                        passed under direct vision. Throughout the procedure,                        the patient's blood pressure, pulse, and oxygen                        saturations were monitored continuously. the [id number]                        [id number] scope was introduced through the left nostril                        and advanced to the tracheobronchial tree. The                        procedure was accomplished without difficulty. The                        patient tolerated the procedure fairly well. The total                        duration of the  procedure was 8 minutes. Scope In: 2:37:39 PM Scope Out: 2:44:33 PM Findings:      Nasal passageway: The left nasal passageway shows evidence of recent       bleeding and shows mucosal friability.      Nasopharynx/oropharynx: The tonsils are surgically absent, the posterior       oropharynx is erythematous and there is cobblestoning of the posterior       pharyngeal wall.      The larynx appears normal. The vocal cords appear normal. The subglottic       space is normal. The trachea is of normal caliber. The carina is sharp.       The tracheobronchial tree was examined to at least the first       subsegmental level. Bronchial mucosa and  anatomy are normal; there are       no endobronchial lesions, and no secretions.      Bronchoalveolar lavage was performed in the RUL apical segment (B1) of       the lung and sent for cell count, bacterial culture, viral smears &       culture, and fungal & AFB analysis and cytology. 30 mL of fluid were       instilled. 10 mL were returned. The return was clear. There were no       mucoid plugs in the return fluid. Impression:           - Hemoptysis with normal CXR                       - Chronic cough with normal chest X-ray                       - Left nasal passageway shows evidence of recent                        bleeding and shows mucosal friability.                       - Tonsils are surgically absent, posterior oropharynx                        is erythematous and cobblestoning of the posterior                        pharyngeal wall.                       - The examination was normal.                       - Bronchoalveolar lavage was performed. Moderate Sedation:      Moderate (conscious) sedation was personally administered by the       endoscopist. The following parameters were monitored: oxygen saturation,       heart rate, blood pressure, respiratory rate, EKG, adequacy of pulmonary       ventilation, and response to care. Total physician intraservice time was       15 minutes. Recommendation:       - Await BAL results.                       - Follow up with bronchoscopist as previously scheduled. Procedure Code(s):    --- Professional ---  31624, Bronchoscopy, rigid or flexible, including                        fluoroscopic guidance, when performed; with bronchial                        alveolar lavage                       99152, Moderate sedation services provided by the same                        physician or other qualified health care professional                        performing the diagnostic or therapeutic service that                         the sedation supports, requiring the presence of an                        independent trained observer to assist in the                        monitoring of the patient's level of consciousness and                        physiological status; initial 15 minutes of                        intraservice time, patient age 78 years or older Diagnosis Code(s):    --- Professional ---                       R04.2, Hemoptysis                       R05, Cough                       R04.0, Epistaxis                       J34.89, Other specified disorders of nose and nasal                        sinuses                       Z98.890, Other specified postprocedural states                       J38.7, Other diseases of larynx CPT copyright 2016 [employer]. All rights reserved. The codes documented in this report are preliminary and upon coder review may  be revised to meet current compliance requirements. Collene Gobble, MD Collene Gobble, MD 10/20/2015 3:03:44 PM Number of Addenda: 0

## 2015-10-20 NOTE — Telephone Encounter (Signed)
Discussed w patient today

## 2015-10-20 NOTE — Discharge Instructions (Signed)
Nothing to eat or drink until 5pm today 10/20/2015.

## 2015-10-20 NOTE — H&P (View-Only) (Signed)
Subjective:    Patient ID: Kari Green, female    DOB: 1965-09-23, 50 y.o.   MRN: [id number]  HPI 50 year old former smoker ( pack years), History of migraine headaches, hypothyroidism, hyperlipidemia, chronic pancreatitis, Irritable bowel syndrome, fibromyalgia and chronic pain. Carries a diagnosis of asthma made in her 89's, allergies on immunotherapy.  Has been started on LABA /ICS (she has received samples, sounds like she is current on Symbicort although she is unsure).  She is referred today by Dr Roseanne Reno for evaluation of chronic cough and hemoptysis. She tells me that she developed non-productive cough over a year ago. Typically non-productive but on a few occasions has had blood in her sputum. Last time she saw any blood was a week ago. She frequently has epistaxis "every day".  She has bleeding from her teeth when she brushes. She is on flonase, benadryl.  She has been tested remotely for GERD, does not have GERD sx.   Studies:  Negative cardiac stress test 05/08/15 CT abdomen 06/06/15 - 4 mm non-obstructing upper pole right renal calculus   Review of Systems  Constitutional: Positive for appetite change.  HENT: Positive for congestion, dental problem, sneezing, sore throat and trouble swallowing.   Respiratory: Positive for cough and shortness of breath.   Cardiovascular: Positive for chest pain.  Gastrointestinal: Positive for abdominal pain.    Past Medical History:  Diagnosis Date  . Anxiety   . Arthritis    right thumb  . Asthma   . Cancer [facility])    history of skin cancer  . Chronic, continuous use of opioids   . Cognitive dysfunction    patient denies  . Complication of anesthesia    "not asleep and aware of everything"  . Degenerative disc disease, cervical   . Degenerative disc disease, lumbar   . Delayed gastric emptying   . Dental crowns present   . Difficult intravenous access   . Difficulty swallowing pills   . Esophageal dysmotility   . Family  history of adverse reaction to anesthesia    family - itching, hives, PONV  . Fibromyalgia   . Hemangioma of liver   . Hemangioma, renal   . History of bronchitis   . History of hiatal hernia   . History of kidney stones 04/2014  . History of pneumonia   . Hypertension   . Hypothyroidism (acquired)   . IC (interstitial cystitis)   . Irritable bowel syndrome (IBS)   . Kidney stone   . Knee pain, right    states gets "nerves burned" periodically  . Limited joint range of motion    right knee - unable to fully extend right leg  . Memory impairment    due to pain   . Migraines   . Osteoporosis   . Pain management   . Pain syndrome, chronic   . PONV (postoperative nausea and vomiting)    uncontrollable itching with anesthesia; hx. of anesthesia awareness  . PONV (postoperative nausea and vomiting)    "need medication before and need medication afterwards"  . Sciatica    bilateral  . Seasonal allergies      Family History  Problem Relation Age of Onset  . Anesthesia problems Mother   . Cancer Mother     ovarian  . Leukemia Mother   . Cancer Daughter     cervical  . Cancer Maternal Aunt     breast     Social History   Social History  . Marital  status: Married    Spouse name: N/A  . Number of children: N/A  . Years of education: N/A   Occupational History  . Not on file.   Social History Main Topics  . Smoking status: Former Smoker    Quit date: 02/22/2006  . Smokeless tobacco: Never Used  . Alcohol use 0.0 oz/week     Comment: rarely  . Drug use: No  . Sexual activity: Not on file   Other Topics Concern  . Not on file   Social History Narrative   Lives with husband.       Allergies  Allergen Reactions  . Cobalt Hives, Swelling and Other (See Comments)    DESTRUCTION OF BONE  . Latex Hives and Swelling    TIGHTNESS IN THROAT  . Nickel Hives, Swelling and Other (See Comments)    DESTRUCTION OF BONE  . Peanuts [Peanut Oil] Hives, Swelling and Other  (See Comments)    TIGHTNESS IN THROAT  . Red Dye Hives and Itching  . Azithromycin Swelling    Undefined area  . Chloraprep One Step [Chlorhexidine Gluconate] Other (See Comments)    CHEMICAL BURN, BLISTERS  . Corticosteroids Swelling  . Eggs Or Egg-Derived Products Hives and Itching  . Flexeril [Cyclobenzaprine] Swelling    Undefined area  . Morphine And Related Hives and Itching    ALL PAIN MEDS - MUST HAVE BENADRYL PRIOR TO PAIN MED.  . Other Hives and Other (See Comments)    Other reaction(s): Other Burns skin ALL METALS - HIVES, SWELLING Over the counter cough syrup- hives.  Antibiotic given for tick bites- swelling, hives, rash LOTIONS - HIVES (DUE TO THE DYE) FRAGRANCES - CAUSES MIGRAINES  . Percocet [Oxycodone-Acetaminophen] Swelling    Undefined area  . Supartz [Sodium Hyaluronate] Hives and Swelling  . Surgical Lubricant Swelling  . Yellow Dyes (Non-Tartrazine) Hives       . Prednisone Other (See Comments)  . Soap Itching    DIAL, DOVE  . Tape Other (See Comments)    BURNS SKIN     Outpatient Medications Prior to Visit  Medication Sig Dispense Refill  . albuterol (PROAIR HFA) 108 (90 Base) MCG/ACT inhaler Inhale 2 puffs into the lungs every 4 (four) hours as needed for wheezing or shortness of breath. 1 Inhaler 1  . albuterol (PROVENTIL) (2.5 MG/3ML) 0.083% nebulizer solution Take 3 mLs (2.5 mg total) by nebulization every 4 (four) hours as needed for wheezing (or coughing spells). 75 vial 1  . ALPRAZolam (XANAX) 0.5 MG tablet Take 1 mg by mouth 3 (three) times daily as needed. Anxiety    . budesonide-formoterol (SYMBICORT) 160-4.5 MCG/ACT inhaler Inhale 2 puffs into the lungs 2 (two) times daily. 1 Inhaler 5  . cyanocobalamin (,VITAMIN B-12,) 1000 MCG/ML injection Inject 1,000 mcg into the muscle once a week. Reported on 03/25/2015    . diphenhydrAMINE (BENADRYL) 50 MG tablet Take 50 mg by mouth every 4 (four) hours as needed for itching (prn , alternates with  hydroxyzine).     Marland Kitchen EPINEPHrine (EPIPEN 2-PAK) 0.3 mg/0.3 mL IJ SOAJ injection Inject 0.3 mLs (0.3 mg total) into the muscle as needed (in the event of a severe life-threatening reaction). 2 Device 2  . estradiol (ESTRACE) 0.5 MG tablet Take 1 mg by mouth at bedtime.     Marland Kitchen EVZIO 0.4 MG/0.4ML SOAJ AUTO INJECT AS NEEDED IN CASE OF EMERGENCY WHERE GRANDCHILDREN MAY GET A HOLD OF PATIENTS PAIN MEDICATIONS.  0  . fluticasone (FLONASE) 50  MCG/ACT nasal spray PLACE 2 SPRAYS INTO BOTH NOSTRILS DAILY. 16 g 5  . HYDROmorphone (DILAUDID) 2 MG tablet Take 0.5-1 tablets (1-2 mg total) by mouth every 4 (four) hours as needed for moderate pain or severe pain. 30 tablet 0  . hydrOXYzine (ATARAX/VISTARIL) 25 MG tablet Take 50 mg by mouth every 4 (four) hours as needed for itching. Alternates with benadryl    . ketoconazole (NIZORAL) 2 % shampoo APPLICATIONS APPLY ON THE SKIN WASH ONCE WEEKLY.  3  . levothyroxine (SYNTHROID, LEVOTHROID) 50 MCG tablet Take 200 mcg by mouth daily. Pt can only take name brand    . MethylPREDNISolone Acetate (DEPO-MEDROL IJ) Inject 1 each as directed daily as needed (allergic reaction.).    Marland Kitchen mometasone-formoterol (DULERA) 200-5 MCG/ACT AERO Inhale 2 puffs into the lungs 2 (two) times daily. 13 g 5  . Multiple Vitamin (MULTIVITAMIN WITH MINERALS) TABS tablet Take 1 tablet by mouth at bedtime.    . OnabotulinumtoxinA (BOTOX IJ) Inject as directed every 3 (three) months. For migraines    . promethazine (PHENERGAN) 25 MG tablet Take 25-50 mg by mouth 2 (two) times daily as needed for nausea or vomiting.     . propranolol (INDERAL) 20 MG tablet Take 20 mg by mouth 2 (two) times daily.    . SUMAtriptan (IMITREX) 100 MG tablet Take 1 tablet at earliest onset of headache.  May repeat once in 2 hours if headache persists or recurs.  Do not exceed 2 tabs in 24 hours 10 tablet 2  . SUMAtriptan 6 MG/0.5ML SOAJ Inject 6 mg into the skin as directed. 3 mL 4  . topiramate (TOPAMAX) 25 MG tablet Take  75 mg by mouth 2 (two) times daily.  1  . UNABLE TO FIND once a week. Allergy injections - once  a week 2 shots    . Zoledronic Acid (RECLAST IV) Inject into the vein as directed. Yearly --  Last dose Oct 2015    . Magnesium Oxide 500 MG TABS Take 500 mg by mouth daily.    . mometasone-formoterol (DULERA) 200-5 MCG/ACT AERO INHALE TWO PUFFS TWICE DAILY TO PREVENT COUGH OR WHEEZE. RINSE, GARGLE, AND SPIT AFTER USE. USE WITH SPACER. 13 g 5   Facility-Administered Medications Prior to Visit  Medication Dose Route Frequency Provider Last Rate Last Dose  . ropivacaine (PF) 2 mg/ml (0.2%) (NAROPIN) epidural 9 mL  9 mL Other Once Milinda Pointer, MD             Objective:   Physical Exam Vitals:   10/16/15 1441  BP: 114/82  Pulse: 82  SpO2: 98%  Weight: 129 lb (58.5 kg)  Height: 5\' 3"  (1.6 m)   Gen: Pleasant, well-nourished, in no distress,  normal affect  ENT: No lesions,  mouth clear,  oropharynx clear, no postnasal drip  Neck: No JVD, no TMG, no carotid bruits  Lungs: No use of accessory muscles, clear without rales or rhonchi  Cardiovascular: RRR, heart sounds normal, no murmur or gallops, no peripheral edema  Musculoskeletal: No deformities, no cyanosis or clubbing, scar R knee  Neuro: alert, non focal  Skin: Warm, no lesions or rashes      Assessment & Plan:  Hemoptysis Etiology unclear. Suspect that this is due to her chronic nonproductive harsh cough with some associated Inflammatory injury and bleeding. Must consider endobronchial lesion. Finally also consider possible compounding issue such is her epistaxis and gingival bleeding. She needs chest x-ray now to compare with her priors. We will  set up bronchoscopy To inspect her posterior pharynx and airways.  Asthma She needs pulmonary function testing to confirm this diagnosis. Given her cough which appears to be her main complaint I like to stop her scheduled bronchodilator (she believes its Symbicort). Keep  albuterol available to use when necessary  Chronic cough Suspect contribution of her chronic rhinitis. Also consider possible influence of her scheduled LABA/ICS. I will stop her scheduled bronchodilator, continue to push for good control of her rhinitis. She may have GERD although she doesn't experience any symptoms. It may be reasonable to perform an empiric trial of a PPI at some point in the future. She says that she's been on a PPI in the past but not in this setting. Bronchoscopy scheduled as above  Baltazar Apo, MD, PhD 10/16/2015, 5:02 PM [facility] [phone] or if no answer [phone]

## 2015-10-20 NOTE — Discharge Instructions (Signed)
Flexible Bronchoscopy, Care After These instructions give you information on caring for yourself after your procedure. Your doctor may also give you more specific instructions. Call your doctor if you have any problems or questions after your procedure. HOME CARE  Do not eat or drink anything for 2 hours after your procedure. If you try to eat or drink before the medicine wears off, food or drink could go into your lungs. You could also burn yourself.  After 2 hours have passed and when you can cough and gag normally, you may eat soft food and drink liquids slowly.  The day after the test, you may eat your normal diet.  You may do your normal activities.  Keep all doctor visits. GET HELP RIGHT AWAY IF:  You get more and more short of breath.  You get light-headed.  You feel like you are going to pass out (faint).  You have chest pain.  You have new problems that worry you.  You cough up more than a little blood.  You cough up more blood than before. MAKE SURE YOU:  Understand these instructions.  Will watch your condition.  Will get help right away if you are not doing well or get worse.   Please call our office for any problems or questions. [phone]  This information is not intended to replace advice given to you by your health care provider. Make sure you discuss any questions you have with your health care provider.   Document Released: 12/06/2008 Document Revised: 02/13/2013 Document Reviewed: 10/13/2012 Elsevier Interactive Patient Education [employer].

## 2015-10-21 ENCOUNTER — Encounter (HOSPITAL_COMMUNITY): Payer: Self-pay | Admitting: Emergency Medicine

## 2015-10-21 ENCOUNTER — Ambulatory Visit (INDEPENDENT_AMBULATORY_CARE_PROVIDER_SITE_OTHER): Payer: BLUE CROSS/BLUE SHIELD | Admitting: *Deleted

## 2015-10-21 DIAGNOSIS — E538 Deficiency of other specified B group vitamins: Secondary | ICD-10-CM | POA: Diagnosis not present

## 2015-10-21 DIAGNOSIS — J309 Allergic rhinitis, unspecified: Secondary | ICD-10-CM | POA: Diagnosis not present

## 2015-10-21 DIAGNOSIS — Z23 Encounter for immunization: Secondary | ICD-10-CM | POA: Diagnosis not present

## 2015-10-21 DIAGNOSIS — D225 Melanocytic nevi of trunk: Secondary | ICD-10-CM | POA: Diagnosis not present

## 2015-10-21 DIAGNOSIS — I788 Other diseases of capillaries: Secondary | ICD-10-CM | POA: Diagnosis not present

## 2015-10-21 DIAGNOSIS — R319 Hematuria, unspecified: Secondary | ICD-10-CM | POA: Diagnosis not present

## 2015-10-21 DIAGNOSIS — D2262 Melanocytic nevi of left upper limb, including shoulder: Secondary | ICD-10-CM | POA: Diagnosis not present

## 2015-10-21 DIAGNOSIS — D2261 Melanocytic nevi of right upper limb, including shoulder: Secondary | ICD-10-CM | POA: Diagnosis not present

## 2015-10-21 LAB — ACID FAST SMEAR (AFB): ACID FAST SMEAR - AFSCU2: NEGATIVE

## 2015-10-21 LAB — ACID FAST SMEAR (AFB, MYCOBACTERIA)

## 2015-10-22 ENCOUNTER — Ambulatory Visit (HOSPITAL_COMMUNITY)
Admission: RE | Admit: 2015-10-22 | Discharge: 2015-10-22 | Disposition: A | Discharge status: Home / Self Care | Payer: BLUE CROSS/BLUE SHIELD | Source: Ambulatory Visit | Attending: Emergency Medicine | Admitting: Emergency Medicine

## 2015-10-22 DIAGNOSIS — R053 Chronic cough: Secondary | ICD-10-CM

## 2015-10-22 DIAGNOSIS — R05 Cough: Secondary | ICD-10-CM | POA: Diagnosis not present

## 2015-10-22 LAB — PULMONARY FUNCTION TEST
DL/VA % pred: 143 %
DL/VA: 6.3 ml/min/mmHg/L
DLCO UNC: 8.89 ml/min/mmHg
DLCO unc % pred: 43 %
FEF 25-75 Post: 0.39 L/sec
FEF 25-75 Pre: 0.23 L/sec
FEF2575-%Change-Post: 67 %
FEF2575-%PRED-PRE: 8 %
FEF2575-%Pred-Post: 14 %
FEV1-%Change-Post: -19 %
FEV1-%PRED-POST: 23 %
FEV1-%PRED-PRE: 29 %
FEV1-PRE: 0.75 L
FEV1-Post: 0.61 L
FEV1FVC-%Change-Post: -41 %
FEV1FVC-%Pred-Pre: 45 %
FEV6-%Change-Post: 29 %
FEV6-%PRED-POST: 69 %
FEV6-%PRED-PRE: 53 %
FEV6-POST: 2.18 L
FEV6-Pre: 1.68 L
FEV6FVC-%CHANGE-POST: -6 %
FEV6FVC-%PRED-POST: 78 %
FEV6FVC-%Pred-Pre: 83 %
FVC-%Change-Post: 38 %
FVC-%PRED-PRE: 63 %
FVC-%Pred-Post: 88 %
FVC-POST: 2.82 L
FVC-PRE: 2.04 L
POST FEV6/FVC RATIO: 77 %
PRE FEV1/FVC RATIO: 37 %
Post FEV1/FVC ratio: 22 %
Pre FEV6/FVC Ratio: 82 %
RV % pred: 133 %
RV: 2.16 L
TLC % PRED: 97 %
TLC: 4.46 L

## 2015-10-22 LAB — CULTURE, BAL-QUANTITATIVE W GRAM STAIN: Culture: 20000 — AB

## 2015-10-22 LAB — CULTURE, BAL-QUANTITATIVE

## 2015-10-22 MED ORDER — ALBUTEROL SULFATE (2.5 MG/3ML) 0.083% IN NEBU
2.5000 mg | INHALATION_SOLUTION | Freq: Once | RESPIRATORY_TRACT | Status: AC
  Administered 2015-10-22: 2.5 mg via RESPIRATORY_TRACT

## 2015-10-23 DIAGNOSIS — M79661 Pain in right lower leg: Secondary | ICD-10-CM | POA: Diagnosis not present

## 2015-10-23 DIAGNOSIS — G43109 Migraine with aura, not intractable, without status migrainosus: Secondary | ICD-10-CM | POA: Diagnosis not present

## 2015-10-23 DIAGNOSIS — M25551 Pain in right hip: Secondary | ICD-10-CM | POA: Diagnosis not present

## 2015-10-23 DIAGNOSIS — G894 Chronic pain syndrome: Secondary | ICD-10-CM | POA: Diagnosis not present

## 2015-10-23 DIAGNOSIS — M25512 Pain in left shoulder: Secondary | ICD-10-CM | POA: Diagnosis not present

## 2015-10-23 DIAGNOSIS — Z79891 Long term (current) use of opiate analgesic: Secondary | ICD-10-CM | POA: Diagnosis not present

## 2015-10-29 ENCOUNTER — Ambulatory Visit (INDEPENDENT_AMBULATORY_CARE_PROVIDER_SITE_OTHER): Payer: BLUE CROSS/BLUE SHIELD

## 2015-10-29 DIAGNOSIS — J309 Allergic rhinitis, unspecified: Secondary | ICD-10-CM | POA: Diagnosis not present

## 2015-10-31 DIAGNOSIS — M542 Cervicalgia: Secondary | ICD-10-CM | POA: Diagnosis not present

## 2015-11-02 ENCOUNTER — Emergency Department (HOSPITAL_BASED_OUTPATIENT_CLINIC_OR_DEPARTMENT_OTHER)
Admission: EM | Admit: 2015-11-02 | Discharge: 2015-11-02 | Disposition: A | Discharge status: Home / Self Care | Payer: BLUE CROSS/BLUE SHIELD | Attending: Physician Assistant | Admitting: Physician Assistant

## 2015-11-02 ENCOUNTER — Emergency Department (HOSPITAL_BASED_OUTPATIENT_CLINIC_OR_DEPARTMENT_OTHER): Payer: BLUE CROSS/BLUE SHIELD

## 2015-11-02 ENCOUNTER — Encounter (HOSPITAL_BASED_OUTPATIENT_CLINIC_OR_DEPARTMENT_OTHER): Payer: Self-pay | Admitting: Emergency Medicine

## 2015-11-02 DIAGNOSIS — N390 Urinary tract infection, site not specified: Secondary | ICD-10-CM | POA: Insufficient documentation

## 2015-11-02 DIAGNOSIS — Z91040 Latex allergy status: Secondary | ICD-10-CM | POA: Diagnosis not present

## 2015-11-02 DIAGNOSIS — Z85828 Personal history of other malignant neoplasm of skin: Secondary | ICD-10-CM | POA: Diagnosis not present

## 2015-11-02 DIAGNOSIS — I10 Essential (primary) hypertension: Secondary | ICD-10-CM | POA: Insufficient documentation

## 2015-11-02 DIAGNOSIS — N200 Calculus of kidney: Secondary | ICD-10-CM | POA: Diagnosis not present

## 2015-11-02 DIAGNOSIS — J449 Chronic obstructive pulmonary disease, unspecified: Secondary | ICD-10-CM | POA: Diagnosis not present

## 2015-11-02 DIAGNOSIS — E039 Hypothyroidism, unspecified: Secondary | ICD-10-CM | POA: Diagnosis not present

## 2015-11-02 DIAGNOSIS — J45909 Unspecified asthma, uncomplicated: Secondary | ICD-10-CM | POA: Diagnosis not present

## 2015-11-02 DIAGNOSIS — Z87891 Personal history of nicotine dependence: Secondary | ICD-10-CM | POA: Diagnosis not present

## 2015-11-02 DIAGNOSIS — Z79899 Other long term (current) drug therapy: Secondary | ICD-10-CM | POA: Diagnosis not present

## 2015-11-02 DIAGNOSIS — Z91010 Allergy to peanuts: Secondary | ICD-10-CM | POA: Insufficient documentation

## 2015-11-02 DIAGNOSIS — R319 Hematuria, unspecified: Secondary | ICD-10-CM | POA: Diagnosis present

## 2015-11-02 LAB — URINALYSIS, ROUTINE W REFLEX MICROSCOPIC
BILIRUBIN URINE: NEGATIVE
Glucose, UA: NEGATIVE mg/dL
Ketones, ur: 15 mg/dL — AB
NITRITE: NEGATIVE
PH: 5.5 (ref 5.0–8.0)
Protein, ur: NEGATIVE mg/dL
SPECIFIC GRAVITY, URINE: 1.021 (ref 1.005–1.030)

## 2015-11-02 LAB — URINE MICROSCOPIC-ADD ON

## 2015-11-02 MED ORDER — PHENAZOPYRIDINE HCL 100 MG PO TABS: 100.0000 mg | ORAL_TABLET | Freq: Three times a day (TID) | ORAL | 0 refills | Status: DC | PRN

## 2015-11-02 MED ORDER — CIPROFLOXACIN HCL 500 MG PO TABS: 500.0000 mg | ORAL_TABLET | Freq: Two times a day (BID) | ORAL | 0 refills | Status: DC

## 2015-11-02 MED ORDER — MORPHINE SULFATE (PF) 4 MG/ML IV SOLN
4.0000 mg | Freq: Once | INTRAVENOUS | Status: AC
  Administered 2015-11-02: 4 mg via INTRAMUSCULAR
  Filled 2015-11-02: qty 1

## 2015-11-02 MED ORDER — CIPROFLOXACIN HCL 500 MG PO TABS
500.0000 mg | ORAL_TABLET | Freq: Once | ORAL | Status: AC
  Administered 2015-11-02: 500 mg via ORAL
  Filled 2015-11-02: qty 1

## 2015-11-02 MED ORDER — DIPHENHYDRAMINE HCL 50 MG/ML IJ SOLN
25.0000 mg | Freq: Once | INTRAMUSCULAR | Status: AC
  Administered 2015-11-02: 25 mg via INTRAMUSCULAR
  Filled 2015-11-02: qty 1

## 2015-11-02 MED ORDER — ACETAMINOPHEN 325 MG PO TABS: 650.0000 mg | ORAL_TABLET | Freq: Once | ORAL | Status: DC

## 2015-11-02 NOTE — ED Notes (Addendum)
C/o bladder pain, R flank pain, "bladder feels larger than usual". Also mentions sore throat with drainage. (denies: sob, nvd, fever dizziness or other sx). States, "1 is a fever for me".

## 2015-11-02 NOTE — ED Notes (Signed)
Not in room, pt in CT 

## 2015-11-02 NOTE — ED Notes (Signed)
EDP at [facility] 

## 2015-11-02 NOTE — ED Provider Notes (Signed)
[place] DEPT [facility] Provider Note   CSN: [id number] Arrival date & time: 11/02/15  1926   By signing my name below, I, Arianna Nassar, attest that this documentation has been prepared under the direction and in the presence of Siah Kannan Julio Alm, MD.  Electronically Signed: Julien Nordmann, ED Scribe. 11/02/15. 8:37 PM.   History   Chief Complaint Chief Complaint  Patient presents with  . Hematuria   The history is provided by the patient. No language interpreter was used.   HPI Comments: Kari Green is a 50 y.o. female who has a PMhx of kidney stones, fibromyalgia, IBS, and osteoporosis presents to the Emergency Department complaining of recurrent, intermittent, moderate hematuria onset one week ago. She has been having associated abdominal pain and sharp pain in her urethra. Pt has been having this issue for several years but says that she has never had bleeding like this before. She notes that her urine is completely blood and she only urinates a "few dribbles". She notes having a kidney stone that is "too high" but specialists say it is not causing her hematuria. she notes taking dilaudid to alleviate her pain with no relief.There are no other complaints.  Past Medical History:  Diagnosis Date  . Anxiety   . Arthritis    right thumb  . Asthma   . Cancer [facility])    history of skin cancer  . Chronic, continuous use of opioids   . Cognitive dysfunction    patient denies  . Complication of anesthesia    "not asleep and aware of everything"  . Degenerative disc disease, cervical   . Degenerative disc disease, lumbar   . Delayed gastric emptying   . Dental crowns present   . Difficult intravenous access   . Difficulty swallowing pills   . Esophageal dysmotility   . Family history of adverse reaction to anesthesia    family - itching, hives, PONV  . Fibromyalgia   . Hemangioma of liver   . Hemangioma, renal   . History of bronchitis   . History of hiatal  hernia   . History of kidney stones 04/2014  . History of pneumonia   . Hypertension   . Hypothyroidism (acquired)   . IC (interstitial cystitis)   . Irritable bowel syndrome (IBS)   . Kidney stone   . Knee pain, right    states gets "nerves burned" periodically  . Limited joint range of motion    right knee - unable to fully extend right leg  . Memory impairment    due to pain   . Migraines   . Osteoporosis   . Pain management   . Pain syndrome, chronic   . PONV (postoperative nausea and vomiting)    uncontrollable itching with anesthesia; hx. of anesthesia awareness  . PONV (postoperative nausea and vomiting)    "need medication before and need medication afterwards"  . Sciatica    bilateral  . Seasonal allergies     Patient Active Problem List   Diagnosis Date Noted  . Hemoptysis 10/16/2015  . Cough 10/16/2015  . Radiculopathy 07/16/2015  . Muscle cramps at night 05/06/2015  . Acute Pain following procedure (Lumbar Sympathetic Radiofrequency Ablation) 05/06/2015  . Temporomandibular joint disorder 04/10/2015  . Buzzing in ear 04/10/2015  . Ear pain, referred 04/10/2015  . OP (osteoporosis) 04/10/2015  . 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/10/2015  . History of migraine headaches 04/10/2015  . Difficulty hearing 04/10/2015  . H/O respiratory system disease 04/10/2015  .  Fibromyalgia 04/10/2015  . Deflected nasal septum 04/10/2015  . Chronic otitis externa 04/10/2015  . Abnormal auditory perception 04/10/2015  . History of allergy to IVP dye 01/31/2015  . Chronic low back pain 01/13/2015  . CRPS (complex regional pain syndrome) type I of lower limb (Right-sided) 01/13/2015  . History of arthroplasty of right knee 01/13/2015  . Chronic pain syndrome 01/13/2015  . Chronic pain 01/13/2015  . Chronic knee pain (Location of Secondary source of pain) (Right) 01/13/2015  . Generalized anxiety disorder 01/13/2015  . Chronic lower extremity pain (Location of Primary Source of  Pain) (Right) 01/13/2015  . Multiple drug allergies (codeine, Lidoderm a DC if, sulfa, latex, IVP dye, Keflex, Augmentin) 01/13/2015  . Latex allergy 01/13/2015  . Long term current use of opiate analgesic 01/13/2015  . Long term prescription opiate use 01/13/2015  . Opiate use 01/13/2015  . Opiate dependence ([place]) 01/13/2015  . Asthma 11/15/2014  . Allergic rhinitis 11/15/2014  . Memory deficits 10/24/2013  . Left shoulder pain 03/16/2013  . COPD (chronic obstructive pulmonary disease) ([place]) 09/21/2012  . Hypothyroidism 09/21/2012  . Abdominal adhesions 02/11/2012  . Abdominal pain, epigastric 12/15/2011  . Irritable bowel syndrome with constipation 02/26/2011    Past Surgical History:  Procedure Laterality Date  . ABDOMINAL HYSTERECTOMY    . ANTERIOR CERVICAL DECOMP/DISCECTOMY FUSION  03/10/2009   C5-6, C6-7  . ANTERIOR CERVICAL DECOMP/DISCECTOMY FUSION N/A 07/16/2015   Procedure: ANTERIOR CERVICAL DECOMPRESSION FUSION 4-5 WITH INSTRUMENTATION AND AUTOGRAFT;  Surgeon: Phylliss Bob, MD;  Location: [place];  Service: Orthopedics;  Laterality: N/A;  ANTERIOR CERVICAL DECOMPRESSION FUSION 4-5 WITH INSTRUMENTATION AND ALLOGRAFT  . APPENDECTOMY  ~ 1992  . AUGMENTATION MAMMAPLASTY Bilateral 1996; 2001, 2016  . BREAST SURGERY  1980's   accessory breast tissue exc.  Marland Kitchen CARPAL TUNNEL RELEASE Right 07/26/2001  . CARPAL TUNNEL RELEASE Left   . CARPOMETACARPEL SUSPENSION PLASTY Right 02/10/2015   Procedure: RIGHT THUMB SUSPENSION  ARTHROPLASTY;  Surgeon: Milly Jakob, MD;  Location: [place];  Service: Orthopedics;  Laterality: Right;  ANESTHESIA: PRE- OP BLOCK  . CYSTO WITH HYDRODISTENSION  01/29/2011   Procedure: CYSTOSCOPY/HYDRODISTENSION;  Surgeon: Claybon Jabs, MD;  Location: [facility];  Service: Urology;  Laterality: N/A;  . CYSTO WITH HYDRODISTENSION N/A 05/08/2014   Procedure: CYSTOSCOPY/HYDRODISTENSION WITH BILATERAL RETROGRADES;  Surgeon: Ardis Hughs, MD;  Location: [facility];  Service: Urology;  Laterality: N/A;  . CYSTO WITH HYDRODISTENSION N/A 03/27/2015   Procedure: CYSTOSCOPY/HYDRODISTENSION OF BLADDER, INSTILLATION OF MARCAINE/PYRIDIUM;  Surgeon: Ardis Hughs, MD;  Location: [facility];  Service: Urology;  Laterality: N/A;  . CYSTO/  BLADDER BX  03/08/2008  . CYSTO/ URETHRAL DILATION/ HYDRODISTENTION/ BLADDER BX  04/10/2003  . CYSTOSCOPY W/ RETROGRADES  01/29/2011   Procedure: CYSTOSCOPY WITH RETROGRADE PYELOGRAM;  Surgeon: Claybon Jabs, MD;  Location[facility];  Service: Urology;  Laterality: Bilateral;  . CYSTOSCOPY WITH STENT PLACEMENT  2006   URETERAL  . DEBRIDEMENT RIGHT THUMB MP JOINT Right 04/10/2002  . DECOMPRESSION LEFT ULNAR NERVE, ELBOW Left 01/29/2003  . ESOPHAGOGASTRODUODENOSCOPY (EGD) WITH ESOPHAGEAL DILATION  10-26-2000  . EXCISION HAGLUND'S DEFORMITY WITH ACHILLES TENDON REPAIR Bilateral 2013-2014  . EYE SURGERY Bilateral    x4  . FISSURECTOMY  ~ 1988 x2  . GANGLION CYST EXCISION Right 02/10/2015   Procedure: REMOVAL GANGLION OF WRIST, PARTIAL ULNAR EXCISION;  Surgeon: Milly Jakob, MD;  Location: [place];  Service: Orthopedics;  Laterality: Right;  . HAND SURGERY Right 01-31-15  . HEMORROIDECTOMY    . KIDNEY STONE SURGERY    . KNEE ARTHROSCOPY Right 08/16/2000  . KNEE ARTHROSCOPY W/ LATERAL RELEASE Right 11/29/2000   states 13 surgeries on right knee  . KNEE ARTHROSCOPY WITH FULKERSON SLIDE Right 11/29/2000  . LAPAROSCOPIC ASSISTED VAGINAL HYSTERECTOMY  Feb 1999  . LAPAROSCOPIC CHOLECYSTECTOMY  03/04/2002  . LAPAROSCOPIC LYSIS OF ADHESIONS  02/11/2012   Procedure: LAPAROSCOPIC LYSIS OF ADHESIONS;  Surgeon: Cheri Fowler, MD;  Location: [facility];  Service: Gynecology;;  . LAPAROSCOPY  02/11/2012   Procedure: LAPAROSCOPY DIAGNOSTIC;  Surgeon: Cheri Fowler, MD;  Location: [facility];  Service: Gynecology;  Laterality: N/A;  Diagnostic   Operative Laparoscopic   . LAPAROSCOPY ADHESIOLYSIS AND REMOVAL POSSIBLE RIGHT OVARY REMNANT  04/18/2000  . LAPAROSCOPY LYSIS ADHESIONS/  BILATERAL SALPINGOOPHORECTOMY/  [place] OF ENDOMETRIOSIS  1998  . MELANOMA EXCISION Left 2013   "behind knee"   . MELANOMA EXCISION     scalp  . NASAL SINUS SURGERY  2013  . NEGATIVE SLEEP STUDY  2006 approx .  per pt  . REFRACTIVE SURGERY Bilateral ~ 1999; ~ 2013  . RIGHT THUMB FUSION OF MPJ Right 10/03/2002  . RIGHT TOTAL KNEE REVISION ARTHROPLASTY Right 03/14/2007  . SHOULDER SURGERY Left   . TONSILLECTOMY AND ADENOIDECTOMY  ~ 1989  . TOTAL KNEE ARTHROPLASTY Right 09/10/2003; 12/08/2012   x3  . TOTAL THYROIDECTOMY  1993  . TRANSANAL HEMORRHOIDAL DEARTERIALIZATION  03/26/2011  . TRANSTHORACIC ECHOCARDIOGRAM  09/22/2012  . VIDEO BRONCHOSCOPY Bilateral 10/20/2015   Procedure: VIDEO BRONCHOSCOPY WITHOUT FLUORO;  Surgeon: Collene Gobble, MD;  Location: [place];  Service: Cardiopulmonary;  Laterality: Bilateral;    OB History    Gravida Para Term Preterm AB Living   0 0 0 0 0     SAB TAB Ectopic Multiple Live Births   0 0 0           Home Medications    Prior to Admission medications   Medication Sig Start Date End Date Taking? Authorizing Provider  albuterol (PROAIR HFA) 108 (90 Base) MCG/ACT inhaler Inhale 2 puffs into the lungs every 4 (four) hours as needed for wheezing or shortness of breath. 08/06/15   Adelina Mings, MD  albuterol (PROVENTIL) (2.5 MG/3ML) 0.083% nebulizer solution Take 3 mLs (2.5 mg total) by nebulization every 4 (four) hours as needed for wheezing (or coughing spells). 08/06/15   Adelina Mings, MD  ALPRAZolam Duanne Moron) 0.5 MG tablet Take 1 mg by mouth 3 (three) times daily as needed. Anxiety    Historical Provider, MD  budesonide-formoterol (SYMBICORT) 160-4.5 MCG/ACT inhaler Inhale 2 puffs into the lungs 2 (two) times daily. 08/06/15   Adelina Mings, MD  cyanocobalamin (,VITAMIN B-12,) 1000 MCG/ML  injection Inject 1,000 mcg into the muscle once a week. Reported on 03/25/2015    Historical Provider, MD  diphenhydrAMINE (BENADRYL) 50 MG tablet Take 50 mg by mouth every 4 (four) hours as needed for itching (prn , alternates with hydroxyzine).     Historical Provider, MD  EPINEPHrine (EPIPEN 2-PAK) 0.3 mg/0.3 mL IJ SOAJ injection Inject 0.3 mLs (0.3 mg total) into the muscle as needed (in the event of a severe life-threatening reaction). 12/09/14   Charlies Silvers, MD  estradiol (ESTRACE) 0.5 MG tablet Take 1 mg by mouth at bedtime.     Historical Provider, MD  EVZIO 0.4 MG/0.4ML SOAJ AUTO INJECT AS NEEDED IN CASE OF EMERGENCY WHERE GRANDCHILDREN  MAY GET A HOLD OF PATIENTS PAIN MEDICATIONS. 11/14/14   Historical Provider, MD  fluticasone (FLONASE) 50 MCG/ACT nasal spray PLACE 2 SPRAYS INTO BOTH NOSTRILS DAILY. 06/09/15   Charlies Silvers, MD  HYDROmorphone (DILAUDID) 2 MG tablet Take 0.5-1 tablets (1-2 mg total) by mouth every 4 (four) hours as needed for moderate pain or severe pain. 07/17/15   Phylliss Bob, MD  hydrOXYzine (ATARAX/VISTARIL) 25 MG tablet Take 50 mg by mouth every 4 (four) hours as needed for itching. Alternates with benadryl    Historical Provider, MD  ketoconazole (NIZORAL) 2 % shampoo APPLICATIONS APPLY ON THE SKIN [place] ONCE WEEKLY. 11/19/14   Historical Provider, MD  levothyroxine (SYNTHROID, LEVOTHROID) 50 MCG tablet Take 200 mcg by mouth daily. Pt can only take name brand    Historical Provider, MD  MethylPREDNISolone Acetate (DEPO-MEDROL IJ) Inject 1 each as directed daily as needed (allergic reaction.).    Historical Provider, MD  mometasone-formoterol (DULERA) 200-5 MCG/ACT AERO Inhale 2 puffs into the lungs 2 (two) times daily. 08/19/15   Adelina Mings, MD  Multiple Vitamin (MULTIVITAMIN WITH MINERALS) TABS tablet Take 1 tablet by mouth at bedtime.    Historical Provider, MD  OnabotulinumtoxinA (BOTOX IJ) Inject as directed every 3 (three) months. For migraines     Historical Provider, MD  promethazine (PHENERGAN) 25 MG tablet Take 25-50 mg by mouth 2 (two) times daily as needed for nausea or vomiting.     Historical Provider, MD  propranolol (INDERAL) 20 MG tablet Take 20 mg by mouth 2 (two) times daily. 05/12/15   Historical Provider, MD  SUMAtriptan (IMITREX) 100 MG tablet Take 1 tablet at earliest onset of headache.  May repeat once in 2 hours if headache persists or recurs.  Do not exceed 2 tabs in 24 hours 09/08/15   Pieter Partridge, DO  SUMAtriptan 6 MG/0.5ML SOAJ Inject 6 mg into the skin as directed. 09/12/15   Pieter Partridge, DO  topiramate (TOPAMAX) 25 MG tablet Take 75 mg by mouth 2 (two) times daily. 04/15/15   Historical Provider, MD  UNABLE TO FIND once a week. Allergy injections - once  a week 2 shots    Historical Provider, MD  Zoledronic Acid (RECLAST IV) Inject into the vein as directed. Yearly --  Last dose Oct 2015    Historical Provider, MD    Family History Family History  Problem Relation Age of Onset  . Anesthesia problems Mother   . Cancer Mother     ovarian  . Leukemia Mother   . Cancer Daughter     cervical  . Cancer Maternal Aunt     breast    Social History Social History  Substance Use Topics  . Smoking status: Former Smoker    Quit date: 02/22/2006  . Smokeless tobacco: Never Used  . Alcohol use 0.0 oz/week     Comment: rarely     Allergies   [place]; Latex; Nickel; Peanuts [peanut oil]; Red dye; Azithromycin; Chloraprep one step [chlorhexidine gluconate]; Corticosteroids; Eggs or egg-derived products; Flexeril [cyclobenzaprine]; Morphine and related; Other; Percocet [oxycodone-acetaminophen]; Supartz [sodium hyaluronate]; Surgical lubricant; Yellow dyes (non-tartrazine); Prednisone; Soap; and Tape   Review of Systems Review of Systems  A complete 10 system review of systems was obtained and all systems are negative except as noted in the HPI and PMH.   Physical Exam Updated Vital Signs BP 117/77 (BP Location:  Right Arm)   Pulse 97   Temp 98.5 F (36.9 C) (Oral)  Resp 18   Ht 5' 3.5" (1.613 m)   Wt 128 lb (58.1 kg)   SpO2 100%   BMI 22.32 kg/m   Physical Exam  Constitutional: She is oriented to person, place, and time. She appears well-developed and well-nourished.  HENT:  Head: Normocephalic.  Eyes: EOM are normal.  Neck: Normal range of motion.  Pulmonary/Chest: Effort normal.  Abdominal: She exhibits no distension.  Musculoskeletal: Normal range of motion.  Neurological: She is alert and oriented to person, place, and time.  Psychiatric: She has a normal mood and affect.  Nursing note and vitals reviewed.    ED Treatments / Results  DIAGNOSTIC STUDIES: Oxygen Saturation is 100% on RA, normal by my interpretation.  COORDINATION OF CARE:  8:32 PM Discussed treatment plan with pt at bedside and pt agreed to plan.   8:35 PM There was a long conversation with pt about medications she can and cannot take due to being allergic to some dyes in medication. Pt seemed very anxious.   Labs (all labs ordered are listed, but only abnormal results are displayed) Labs Reviewed  URINALYSIS, ROUTINE W REFLEX MICROSCOPIC (NOT AT [facility]) - Abnormal; Notable for the following:       Result Value   APPearance TURBID (*)    Hgb urine dipstick LARGE (*)    Ketones, ur 15 (*)    Leukocytes, UA LARGE (*)    All other components within normal limits  URINE MICROSCOPIC-ADD ON - Abnormal; Notable for the following:    Squamous Epithelial / LPF 6-30 (*)    Bacteria, UA MANY (*)    All other components within normal limits    EKG  EKG Interpretation None       Radiology No results found.  Procedures Procedures (including critical care time)  Medications Ordered in ED Medications - No data to display   Initial Impression / Assessment and Plan / ED Course  I have reviewed the triage vital signs and the nursing notes.  Pertinent labs & imaging results that were available during my  care of the patient were reviewed by me and considered in my medical decision making (see chart for details).  Clinical Course    Patient is a 50 year old female with chronic hematuria. She reports this been going on for several years. She is followed by urology. She reports that she's even had an exploratory surgery once they looked in her kidney and could not find any stones causing her hematuria. She reports hematuria that got worse today. She states she's having a lot of pain. Unfortunately patient has complicated pain issues. She is prescribed by mouth Dilaudid. She says he ran out of it and will not be able to refill it for the next couple days. I do not feel comfortable prescribing any narcotics for this female given her complicated pain history. I will give her Tylenol here. She does not want IM medications because she is "allergic to everything". She states she requires IV Benadryl. I do not think she needs  IV at this time and therefore will not be doing so. She cannot take many by mouth medications because she has an allergy to dyes.  We will give her Tylenol. I have low suspicion for pathology given her normal physical exam vital signs and chronic nature of this disorder.  Patient requested IM morphine with IM Benadryl. We have acquiesced. Patient's CAT scan shows no change from prior. (known small stone) Patient's urine looks to be possibly urinary  tract infection. We will treat presumptively until cultures come back. Patient is allergic again to everything that has a dye. However she says it is worth it for her and she will just take Benadryl. We have warned her that both Cipro and pyrridium had a chance of having dyes in them. She is aware but would like the medications anyway. She'll follow-up with her urologist this week.  Patient is comfortable, ambulatory, and taking PO at time of discharge.  Patient expressed understanding about return precautions.     Final Clinical Impressions(s)  / ED Diagnoses   Final diagnoses:  None   I personally performed the services described in this documentation, which was scribed in my presence. The recorded information has been reviewed and is accurate.    New Prescriptions New Prescriptions   No medications on file     Mallory Schaad Julio Alm, MD 11/02/15 [id number]

## 2015-11-02 NOTE — ED Triage Notes (Addendum)
Patient reports that "my on call doctor told me to come".  States that she had blood in her urine.  Reports that she has intermittent sharp pains in her urethra.  Reports that she has had this problem intermittently for years but states that this incidence has been going on since last week.

## 2015-11-02 NOTE — Discharge Instructions (Signed)
You were seen today and found to have a urinary tract infection. Please take the antibiotics. Keep in mind that they may have dye in them. We also gave you pyridium to help with the urinary tract symptoms, this also contains dye. You expressed interest in both the antibiotic in the Pyridium despite the allergy to dyes. Please call your urologist this week.

## 2015-11-02 NOTE — ED Notes (Signed)
Back from CT

## 2015-11-02 NOTE — ED Notes (Signed)
updated

## 2015-11-03 DIAGNOSIS — Z01419 Encounter for gynecological examination (general) (routine) without abnormal findings: Secondary | ICD-10-CM | POA: Diagnosis not present

## 2015-11-03 DIAGNOSIS — Z124 Encounter for screening for malignant neoplasm of cervix: Secondary | ICD-10-CM | POA: Diagnosis not present

## 2015-11-03 DIAGNOSIS — Z130 Encounter for screening for diseases of the blood and blood-forming organs and certain disorders involving the immune mechanism: Secondary | ICD-10-CM | POA: Diagnosis not present

## 2015-11-03 DIAGNOSIS — Z1389 Encounter for screening for other disorder: Secondary | ICD-10-CM | POA: Diagnosis not present

## 2015-11-03 DIAGNOSIS — R319 Hematuria, unspecified: Secondary | ICD-10-CM | POA: Diagnosis not present

## 2015-11-03 DIAGNOSIS — M25512 Pain in left shoulder: Secondary | ICD-10-CM | POA: Diagnosis not present

## 2015-11-05 DIAGNOSIS — R310 Gross hematuria: Secondary | ICD-10-CM | POA: Diagnosis not present

## 2015-11-05 LAB — URINE CULTURE

## 2015-11-06 ENCOUNTER — Telehealth (HOSPITAL_COMMUNITY): Payer: Self-pay

## 2015-11-06 NOTE — Telephone Encounter (Signed)
Post ED Visit - Positive Culture Follow-up: Successful Patient Follow-Up  Culture assessed and recommendations reviewed by: []  Elenor Quinones, Pharm.D. []  Heide Guile, Pharm.D., BCPS []  Parks Neptune, Pharm.D. []  Alycia Rossetti, Pharm.D., BCPS []  [place], [place].D., BCPS, AAHIVP []  Legrand Como, Pharm.D., BCPS, AAHIVP []  Milus Glazier, Pharm.D. []  Stephens November, Pharm.D. Shanon Rosser, Pharm.D.  Positive  culture  []  Patient discharged without antimicrobial prescription and treatment is now indicated [x]  Organism is resistant to prescribed ED discharge antimicrobial(Ciprofloxacin) []  Patient with positive blood cultures  Changes discussed with ED provider: Arlean Hopping PA  New antibiotic prescription "Bactrim DS BID x 7 days" Called to   [facility] patient, date 11/06/2015, time 10:50  LVM requesting callback.  Letter sent to [facility] address.     Dortha Kern 11/06/2015, 10:53 AM

## 2015-11-06 NOTE — Progress Notes (Signed)
ED Antimicrobial Stewardship Positive Culture Follow Up   Kari Green is an 50 y.o. female who presented to [facility] on 11/02/2015 with a chief complaint of  Chief Complaint  Patient presents with  . Hematuria    Recent Results (from the past 720 hour(s))  Culture, bal-quantitative     Status: Abnormal   Collection Time: 10/20/15  2:55 PM  Result Value Ref Range Status   Specimen Description BRONCHIAL ALVEOLAR LAVAGE  Final   Special Requests NONE  Final   Gram Stain   Final    MODERATE WBC PRESENT,BOTH PMN AND MONONUCLEAR NO ORGANISMS SEEN    Culture (A)  Final    20,000 COLONIES/mL Consistent with normal respiratory flora.   Report Status 10/22/2015 FINAL  Final  Fungus Culture With Stain     Status: None (Preliminary result)   Collection Time: 10/20/15  2:55 PM  Result Value Ref Range Status   Fungus Stain Final report  Final    Comment: (NOTE) Performed At: [facility] [place], [place] [id number] Lindon Romp MD [id number]    Fungus (Mycology) Culture PENDING  Incomplete   Fungal Source BRONCHIAL ALVEOLAR LAVAGE  Final  Acid Fast Smear (AFB)     Status: None   Collection Time: 10/20/15  2:55 PM  Result Value Ref Range Status   AFB Specimen Processing Concentration  Final   Acid Fast Smear Negative  Final    Comment: (NOTE) Performed At: [facility] [place], [place] [id number] Lindon Romp MD [id number]    Source (AFB) BRONCHIAL ALVEOLAR LAVAGE  Final  Fungus Culture Result     Status: None   Collection Time: 10/20/15  2:55 PM  Result Value Ref Range Status   Result 1 Comment  Final    Comment: (NOTE) KOH/Calcofluor preparation:  no fungus observed. Performed At: [facility] [place], [place] [id number] Lindon Romp MD [id number]   Urine culture     Status: Abnormal   Collection Time: 11/02/15  7:55 PM  Result Value Ref Range Status   Specimen Description  URINE, RANDOM  Final   Special Requests NONE  Final   Culture >=100,000 COLONIES/mL ESCHERICHIA COLI (A)  Final   Report Status 11/05/2015 FINAL  Final   Organism ID, Bacteria ESCHERICHIA COLI (A)  Final      Susceptibility   Escherichia coli - MIC*    AMPICILLIN <=2 SENSITIVE Sensitive     CEFAZOLIN <=4 SENSITIVE Sensitive     CEFTRIAXONE <=1 SENSITIVE Sensitive     CIPROFLOXACIN >=4 RESISTANT Resistant     GENTAMICIN <=1 SENSITIVE Sensitive     IMIPENEM <=0.25 SENSITIVE Sensitive     NITROFURANTOIN <=16 SENSITIVE Sensitive     TRIMETH/SULFA <=20 SENSITIVE Sensitive     AMPICILLIN/SULBACTAM <=2 SENSITIVE Sensitive     PIP/TAZO <=4 SENSITIVE Sensitive     Extended ESBL NEGATIVE Sensitive     * >=100,000 COLONIES/mL ESCHERICHIA COLI    [x]  Treated with ciprofloxacin, organism resistant to prescribed antimicrobial   New antibiotic prescription: Bactrim DS twice a day for 7 days  ED Provider: Arlean Hopping, PA-C   Kari Green 11/06/2015, 8:50 AM Pharmacy Resident Phone# [phone]

## 2015-11-06 NOTE — Telephone Encounter (Signed)
Pt informed of dx and need for addl tx.  Rx called to [facility] [phone] and left on VM

## 2015-11-10 DIAGNOSIS — M25512 Pain in left shoulder: Secondary | ICD-10-CM | POA: Diagnosis not present

## 2015-11-13 ENCOUNTER — Ambulatory Visit (INDEPENDENT_AMBULATORY_CARE_PROVIDER_SITE_OTHER): Payer: BLUE CROSS/BLUE SHIELD

## 2015-11-13 DIAGNOSIS — J309 Allergic rhinitis, unspecified: Secondary | ICD-10-CM | POA: Diagnosis not present

## 2015-11-15 DIAGNOSIS — M19012 Primary osteoarthritis, left shoulder: Secondary | ICD-10-CM | POA: Diagnosis not present

## 2015-11-17 ENCOUNTER — Ambulatory Visit: Payer: Self-pay | Admitting: Neurology

## 2015-11-17 ENCOUNTER — Telehealth: Payer: Self-pay | Admitting: Pain Medicine

## 2015-11-17 NOTE — Telephone Encounter (Addendum)
Needs to get referral to [facility] to have porta cath put in so she can have RF. Last visit was April 2017, says Dr. Dossie Arbour told her she needs porta cath and [facility] requires referral. Will need notes as to why patient needs porta cath and referral

## 2015-11-18 ENCOUNTER — Other Ambulatory Visit: Payer: Self-pay | Admitting: Orthopedic Surgery

## 2015-11-18 ENCOUNTER — Telehealth: Payer: Self-pay | Admitting: Emergency Medicine

## 2015-11-18 NOTE — Telephone Encounter (Signed)
Email recd from patient stating the following  I need to see Dr. Lamonte Sakai in regards to my Bronchoscopy that was done on August 28,2017. your office was gong to get me back to see him ASAP to talk about my results of that as well as my breathing test at [facility]. I understand vacation but it has been over a month and my actual appointment is not until the middle of October. this is an extremely long time to keep someone waiting for such an important test result so I'm asking is their anyway to work me in I have surgery on October 12 th we learned that my bone is dying and ball of the shoulder looks as if someone took a bite out of it, and have no explanation.Be asked if I had bone cancer or something my medical Dr. Laurian Brim do the cancer test! leukemia and cancer of bone and brain and esophageal stomach etc. I need to know why I feel bad all the time. it is not normal yes i have fibromyalgia but I don't think all the joint pain I'm feeing is from that I need that out of the equation their has to be another cause. sorry so long.

## 2015-11-18 NOTE — Telephone Encounter (Signed)
Please see pt's email below. Pt would like to be worked in to see you ASAP for bronch and PFT results.  RB - Please advise. Thanks!

## 2015-11-18 NOTE — Telephone Encounter (Signed)
Ok to double book her sooner

## 2015-11-18 NOTE — Telephone Encounter (Signed)
Dr. Dossie Arbour                    Please advise on this. Do you want to make this referral for port a cath or does she need PCP to do it?Marland Kitchen

## 2015-11-19 NOTE — Telephone Encounter (Signed)
If a referral is needed we will need to be from the PCP however, if her vascular axis is so bad that we need a Port-A-Cath to the procedure, I'm not so sure that it is indicated.

## 2015-11-19 NOTE — Telephone Encounter (Signed)
Spoke with the pt and scheduled her for ov with RB tomorrow at 9:45 am

## 2015-11-19 NOTE — Telephone Encounter (Signed)
lmtcb x1 for pt. I have held a spot on RB's schedule for tomorrow at 9:45am.

## 2015-11-20 ENCOUNTER — Encounter: Payer: Self-pay | Admitting: Emergency Medicine

## 2015-11-20 ENCOUNTER — Ambulatory Visit (INDEPENDENT_AMBULATORY_CARE_PROVIDER_SITE_OTHER): Payer: BLUE CROSS/BLUE SHIELD | Admitting: Emergency Medicine

## 2015-11-20 DIAGNOSIS — M25551 Pain in right hip: Secondary | ICD-10-CM | POA: Diagnosis not present

## 2015-11-20 DIAGNOSIS — J3089 Other allergic rhinitis: Secondary | ICD-10-CM | POA: Diagnosis not present

## 2015-11-20 DIAGNOSIS — R059 Cough, unspecified: Secondary | ICD-10-CM

## 2015-11-20 DIAGNOSIS — R05 Cough: Secondary | ICD-10-CM

## 2015-11-20 DIAGNOSIS — M79661 Pain in right lower leg: Secondary | ICD-10-CM | POA: Diagnosis not present

## 2015-11-20 DIAGNOSIS — R042 Hemoptysis: Secondary | ICD-10-CM

## 2015-11-20 DIAGNOSIS — J4530 Mild persistent asthma, uncomplicated: Secondary | ICD-10-CM | POA: Diagnosis not present

## 2015-11-20 DIAGNOSIS — Z79891 Long term (current) use of opiate analgesic: Secondary | ICD-10-CM | POA: Diagnosis not present

## 2015-11-20 DIAGNOSIS — G43109 Migraine with aura, not intractable, without status migrainosus: Secondary | ICD-10-CM | POA: Diagnosis not present

## 2015-11-20 DIAGNOSIS — G894 Chronic pain syndrome: Secondary | ICD-10-CM | POA: Diagnosis not present

## 2015-11-20 DIAGNOSIS — M25512 Pain in left shoulder: Secondary | ICD-10-CM | POA: Diagnosis not present

## 2015-11-20 LAB — FUNGUS CULTURE WITH STAIN

## 2015-11-20 LAB — FUNGUS CULTURE RESULT

## 2015-11-20 LAB — FUNGAL ORGANISM REFLEX

## 2015-11-20 NOTE — Assessment & Plan Note (Signed)
Fluticasone

## 2015-11-20 NOTE — Assessment & Plan Note (Signed)
Unclear based on spirometry exactly how much asthma she may have. Her flow volume loop is consistent with fixed upper airway obstruction (not seen on bronchoscopy). This is functional and related to chronic irritation. Probably we can stop her Symbicort as it may be a contributor to upper airway irritation. She will continue albuterol as needed.

## 2015-11-20 NOTE — Telephone Encounter (Signed)
Patient states that you were the one that informed her that she needed a port-a-cath for future procedures due to difficult access. She states that every time she has to be stuck- even if just for blood work or an ED visit- they always have to stick her multiple times and sometimes access is never obtained. So in order for her to have a referral to Vascular you are going to need to do it because patient states that you were the one who initiated the conversation about needing a port-a-cath. Please advise me on what to do so that I can inform patient. Thank you- Anderson Malta

## 2015-11-20 NOTE — Progress Notes (Signed)
Subjective:    Patient ID: Kari Green, female    DOB: February 08, 1966, 50 y.o.   MRN: [id number]  HPI 50 year old former smoker ( pack years), History of migraine headaches, hypothyroidism, hyperlipidemia, chronic pancreatitis, Irritable bowel syndrome, fibromyalgia and chronic pain. Carries a diagnosis of asthma made in her 31's, allergies on immunotherapy.  Has been started on LABA /ICS (she has received samples, sounds like she is current on Symbicort although she is unsure).  She is referred today by Dr Roseanne Reno for evaluation of chronic cough and hemoptysis. She tells me that she developed non-productive cough over a year ago. Typically non-productive but on a few occasions has had blood in her sputum. Last time she saw any blood was a week ago. She frequently has epistaxis "every day".  She has bleeding from her teeth when she brushes. She is on flonase, benadryl.  She has been tested remotely for GERD, does not have GERD sx.   ROV 11/20/15 -- This follow-up visit for chronic cough. She also carries a possible history of asthma. Her last visit we stopped her maintenance bronchodilator, continue to push therapy for her rhinitis. A bronchoscopy was performed on showed some evidence of intranasal bleeding specially in the left nasal passage, profound erythema and cobblestoning in the posterior pharyngeal wall. The bronchoscopy was otherwise normal without any evidence of endobronchial lesion. On BAL eosinophil count was normal. AFB, fungal smears were negative. Culture negative. She complains of severe fatigue, cough. Her PFT were done 8/30 that are significant for fixed UA obstruction / VCD. The bronchoscopy did not show an obstructing lesion.   Studies:  Negative cardiac stress test 05/08/15 CT abdomen 06/06/15 - 4 mm non-obstructing upper pole right renal calculus   Review of Systems  Constitutional: Positive for appetite change.  HENT: Positive for congestion, dental problem, sneezing,  sore throat and trouble swallowing.   Respiratory: Positive for cough and shortness of breath.   Cardiovascular: Positive for chest pain.  Gastrointestinal: Positive for abdominal pain.    Past Medical History:  Diagnosis Date  . Anxiety   . Arthritis    right thumb  . Asthma   . Cancer [facility])    history of skin cancer  . Chronic, continuous use of opioids   . Cognitive dysfunction    patient denies  . Complication of anesthesia    "not asleep and aware of everything"  . Degenerative disc disease, cervical   . Degenerative disc disease, lumbar   . Delayed gastric emptying   . Dental crowns present   . Difficult intravenous access   . Difficulty swallowing pills   . Esophageal dysmotility   . Family history of adverse reaction to anesthesia    family - itching, hives, PONV  . Fibromyalgia   . Hemangioma of liver   . Hemangioma, renal   . History of bronchitis   . History of hiatal hernia   . History of kidney stones 04/2014  . History of pneumonia   . Hypertension   . Hypothyroidism (acquired)   . IC (interstitial cystitis)   . Irritable bowel syndrome (IBS)   . Kidney stone   . Knee pain, right    states gets "nerves burned" periodically  . Limited joint range of motion    right knee - unable to fully extend right leg  . Memory impairment    due to pain   . Migraines   . Osteoporosis   . Pain management   . Pain syndrome, chronic   .  PONV (postoperative nausea and vomiting)    uncontrollable itching with anesthesia; hx. of anesthesia awareness  . PONV (postoperative nausea and vomiting)    "need medication before and need medication afterwards"  . Sciatica    bilateral  . Seasonal allergies      Family History  Problem Relation Age of Onset  . Anesthesia problems Mother   . Cancer Mother     ovarian  . Leukemia Mother   . Cancer Daughter     cervical  . Cancer Maternal Aunt     breast     Social History   Social History  . Marital status:  Married    Spouse name: N/A  . Number of children: N/A  . Years of education: N/A   Occupational History  . Not on file.   Social History Main Topics  . Smoking status: Former Smoker    Quit date: 02/22/2006  . Smokeless tobacco: Never Used  . Alcohol use 0.0 oz/week     Comment: rarely  . Drug use: No  . Sexual activity: Not on file   Other Topics Concern  . Not on file   Social History Narrative   Lives with husband.       Allergies  Allergen Reactions  . Cobalt Hives, Swelling and Other (See Comments)    DESTRUCTION OF BONE  . Latex Hives and Swelling    TIGHTNESS IN THROAT  . Nickel Hives, Swelling and Other (See Comments)    DESTRUCTION OF BONE  . Peanuts [Peanut Oil] Hives, Swelling and Other (See Comments)    TIGHTNESS IN THROAT  . Red Dye Hives and Itching  . Azithromycin Swelling    Undefined area  . Chloraprep One Step [Chlorhexidine Gluconate] Other (See Comments)    CHEMICAL BURN, BLISTERS  . Corticosteroids Swelling  . Eggs Or Egg-Derived Products Hives and Itching  . Flexeril [Cyclobenzaprine] Swelling    Undefined area  . Morphine And Related Hives and Itching    ALL PAIN MEDS - MUST HAVE BENADRYL PRIOR TO PAIN MED.  . Other Hives and Other (See Comments)    Other reaction(s): Other Burns skin ALL METALS - HIVES, SWELLING Over the counter cough syrup- hives.  Antibiotic given for tick bites- swelling, hives, rash LOTIONS - HIVES (DUE TO THE DYE) FRAGRANCES - CAUSES MIGRAINES  . Percocet [Oxycodone-Acetaminophen] Swelling    Undefined area  . Supartz [Sodium Hyaluronate] Hives and Swelling  . Surgical Lubricant Swelling  . Yellow Dyes (Non-Tartrazine) Hives       . Prednisone Other (See Comments)  . Soap Itching    DIAL, DOVE  . Tape Other (See Comments)    BURNS SKIN     Outpatient Medications Prior to Visit  Medication Sig Dispense Refill  . albuterol (PROAIR HFA) 108 (90 Base) MCG/ACT inhaler Inhale 2 puffs into the lungs every 4  (four) hours as needed for wheezing or shortness of breath. 1 Inhaler 1  . albuterol (PROVENTIL) (2.5 MG/3ML) 0.083% nebulizer solution Take 3 mLs (2.5 mg total) by nebulization every 4 (four) hours as needed for wheezing (or coughing spells). 75 vial 1  . ALPRAZolam (XANAX) 0.5 MG tablet Take 1 mg by mouth 3 (three) times daily as needed. Anxiety    . budesonide-formoterol (SYMBICORT) 160-4.5 MCG/ACT inhaler Inhale 2 puffs into the lungs 2 (two) times daily. 1 Inhaler 5  . ciprofloxacin (CIPRO) 500 MG tablet Take 1 tablet (500 mg total) by mouth 2 (two) times daily. 28 tablet 0  .  cyanocobalamin (,VITAMIN B-12,) 1000 MCG/ML injection Inject 1,000 mcg into the muscle once a week. Reported on 03/25/2015    . diphenhydrAMINE (BENADRYL) 50 MG tablet Take 50 mg by mouth every 4 (four) hours as needed for itching (prn , alternates with hydroxyzine).     Marland Kitchen EPINEPHrine (EPIPEN 2-PAK) 0.3 mg/0.3 mL IJ SOAJ injection Inject 0.3 mLs (0.3 mg total) into the muscle as needed (in the event of a severe life-threatening reaction). 2 Device 2  . estradiol (ESTRACE) 0.5 MG tablet Take 1 mg by mouth at bedtime.     Marland Kitchen EVZIO 0.4 MG/0.4ML SOAJ AUTO INJECT AS NEEDED IN CASE OF EMERGENCY WHERE GRANDCHILDREN MAY GET A HOLD OF PATIENTS PAIN MEDICATIONS.  0  . fluticasone (FLONASE) 50 MCG/ACT nasal spray PLACE 2 SPRAYS INTO BOTH NOSTRILS DAILY. 16 g 5  . HYDROmorphone (DILAUDID) 2 MG tablet Take 0.5-1 tablets (1-2 mg total) by mouth every 4 (four) hours as needed for moderate pain or severe pain. 30 tablet 0  . hydrOXYzine (ATARAX/VISTARIL) 25 MG tablet Take 50 mg by mouth every 4 (four) hours as needed for itching. Alternates with benadryl    . ketoconazole (NIZORAL) 2 % shampoo APPLICATIONS APPLY ON THE SKIN WASH ONCE WEEKLY.  3  . levothyroxine (SYNTHROID, LEVOTHROID) 50 MCG tablet Take 200 mcg by mouth daily. Pt can only take name brand    . MethylPREDNISolone Acetate (DEPO-MEDROL IJ) Inject 1 each as directed daily as  needed (allergic reaction.).    Marland Kitchen mometasone-formoterol (DULERA) 200-5 MCG/ACT AERO Inhale 2 puffs into the lungs 2 (two) times daily. 13 g 5  . Multiple Vitamin (MULTIVITAMIN WITH MINERALS) TABS tablet Take 1 tablet by mouth at bedtime.    . OnabotulinumtoxinA (BOTOX IJ) Inject as directed every 3 (three) months. For migraines    . phenazopyridine (PYRIDIUM) 100 MG tablet Take 1 tablet (100 mg total) by mouth 3 (three) times daily as needed for pain. 10 tablet 0  . promethazine (PHENERGAN) 25 MG tablet Take 25-50 mg by mouth 2 (two) times daily as needed for nausea or vomiting.     . propranolol (INDERAL) 20 MG tablet Take 20 mg by mouth 2 (two) times daily.    . SUMAtriptan (IMITREX) 100 MG tablet Take 1 tablet at earliest onset of headache.  May repeat once in 2 hours if headache persists or recurs.  Do not exceed 2 tabs in 24 hours 10 tablet 2  . SUMAtriptan 6 MG/0.5ML SOAJ Inject 6 mg into the skin as directed. 3 mL 4  . topiramate (TOPAMAX) 25 MG tablet Take 75 mg by mouth 2 (two) times daily.  1  . UNABLE TO FIND once a week. Allergy injections - once  a week 2 shots    . Zoledronic Acid (RECLAST IV) Inject into the vein as directed. Yearly --  Last dose Oct 2015     Facility-Administered Medications Prior to Visit  Medication Dose Route Frequency Provider Last Rate Last Dose  . ropivacaine (PF) 2 mg/ml (0.2%) (NAROPIN) epidural 9 mL  9 mL Other Once Milinda Pointer, MD             Objective:   Physical Exam Vitals:   11/20/15 0955  BP: 112/80  Pulse: 72  SpO2: 98%  Weight: 133 lb (60.3 kg)  Height: 5\' 3"  (1.6 m)   Gen: Pleasant, well-nourished, in no distress,  normal affect  ENT: No lesions,  mouth clear,  oropharynx clear, no postnasal drip  Neck: No JVD, no  TMG, no carotid bruits  Lungs: No use of accessory muscles, clear without rales or rhonchi  Cardiovascular: RRR, heart sounds normal, no murmur or gallops, no peripheral edema  Musculoskeletal: No  deformities, no cyanosis or clubbing, scar R knee  Neuro: alert, non focal  Skin: Warm, no lesions or rashes      Assessment & Plan:  Asthma Unclear based on spirometry exactly how much asthma she may have. Her flow volume loop is consistent with fixed upper airway obstruction (not seen on bronchoscopy). This is functional and related to chronic irritation. Probably we can stop her Symbicort as it may be a contributor to upper airway irritation. She will continue albuterol as needed.  Allergic rhinitis Fluticasone.  Hemoptysis No evidence of an endobronchial lesion on bronchoscopy. I believe that this was likely from her nasopharynx.  Cough Being driven by her upper airway irritation. She does not have overt GERD but she does have frequent regurgitation due to poor peristalsis and dysphasia. Suspect that this is a large contributor. The cough and UA obstruction are secondary  Baltazar Apo, MD, PhD 11/20/2015, 10:15 AM [facility] [phone] or if no answer [phone]

## 2015-11-20 NOTE — Assessment & Plan Note (Signed)
No evidence of an endobronchial lesion on bronchoscopy. I believe that this was likely from her nasopharynx.

## 2015-11-20 NOTE — Patient Instructions (Addendum)
Please stop Symbicort  Continue to keep albuterol available to use as needed  Continue fluticasone nasal spray daily Follow with Dr Lamonte Sakai as needed

## 2015-11-20 NOTE — Assessment & Plan Note (Signed)
Being driven by her upper airway irritation. She does not have overt GERD but she does have frequent regurgitation due to poor peristalsis and dysphasia. Suspect that this is a large contributor. The cough and UA obstruction are secondary

## 2015-11-20 NOTE — Telephone Encounter (Signed)
No, I did not suggest that. I can provide sedation orally or IM. If someone else needs it, then PCP.

## 2015-11-21 NOTE — Telephone Encounter (Signed)
Left message for patient to call office.  

## 2015-11-21 NOTE — Telephone Encounter (Signed)
Patient called again and left a vm regarding this. Please call her and let her know what is going on. Thanks

## 2015-11-21 NOTE — Telephone Encounter (Signed)
Patient notified that we could provide sedation orally or IM, but that a referral for port a cath should be done by her PCP if needed.  Patient states understanding and has no further questions.

## 2015-11-24 ENCOUNTER — Ambulatory Visit (INDEPENDENT_AMBULATORY_CARE_PROVIDER_SITE_OTHER): Payer: BLUE CROSS/BLUE SHIELD

## 2015-11-24 DIAGNOSIS — M25532 Pain in left wrist: Secondary | ICD-10-CM | POA: Diagnosis not present

## 2015-11-24 DIAGNOSIS — M79641 Pain in right hand: Secondary | ICD-10-CM | POA: Diagnosis not present

## 2015-11-24 DIAGNOSIS — J309 Allergic rhinitis, unspecified: Secondary | ICD-10-CM

## 2015-11-27 DIAGNOSIS — M47817 Spondylosis without myelopathy or radiculopathy, lumbosacral region: Secondary | ICD-10-CM | POA: Diagnosis not present

## 2015-11-27 DIAGNOSIS — M47818 Spondylosis without myelopathy or radiculopathy, sacral and sacrococcygeal region: Secondary | ICD-10-CM | POA: Diagnosis not present

## 2015-11-27 DIAGNOSIS — M47816 Spondylosis without myelopathy or radiculopathy, lumbar region: Secondary | ICD-10-CM | POA: Diagnosis not present

## 2015-12-02 NOTE — Pre-Procedure Instructions (Signed)
Kari Green  12/02/2015     Your procedure is scheduled on : Thursday December 04, 2015 at 7:30 AM.  Report to [facility] Admitting at 5:30 AM.  Call this number if you have problems the morning of surgery: [phone]    Remember:  Do not eat food or drink liquids after midnight.  Take these medicines the morning of surgery with A SIP OF WATER : Albuterol inhaler/nebulizer if needed (Bring inhaler with you)   Stop taking any vitamins, herbal medications/supplements, NSAIDs, Ibuprofen, Advil, Motrin, Aleve, etc today    Do not wear jewelry, make-up or nail polish.  Do not wear lotions, powders, or perfumes, or deoderant.  Do not shave 48 hours prior to surgery.    Do not bring valuables to the hospital.  [facility] is not responsible for any belongings or valuables.  Contacts, dentures or bridgework may not be worn into surgery.  Leave your suitcase in the car.  After surgery it may be brought to your room.  For patients admitted to the hospital, discharge time will be determined by your treatment team.  Patients discharged the day of surgery will not be allowed to drive home.   Name and phone number of your driver:    Special instructions:  Shower using CHG soap the night before and the morning of your surgery  Please read over the following fact sheets that you were given.

## 2015-12-03 ENCOUNTER — Encounter (HOSPITAL_COMMUNITY): Payer: Self-pay

## 2015-12-03 ENCOUNTER — Encounter (HOSPITAL_COMMUNITY)
Admission: RE | Admit: 2015-12-03 | Discharge: 2015-12-03 | Disposition: A | Discharge status: Home / Self Care | Payer: BLUE CROSS/BLUE SHIELD | Source: Ambulatory Visit | Attending: Orthopedic Surgery | Admitting: Orthopedic Surgery

## 2015-12-03 ENCOUNTER — Ambulatory Visit (INDEPENDENT_AMBULATORY_CARE_PROVIDER_SITE_OTHER): Payer: BLUE CROSS/BLUE SHIELD

## 2015-12-03 DIAGNOSIS — M879 Osteonecrosis, unspecified: Secondary | ICD-10-CM | POA: Diagnosis not present

## 2015-12-03 DIAGNOSIS — Z01818 Encounter for other preprocedural examination: Secondary | ICD-10-CM | POA: Insufficient documentation

## 2015-12-03 DIAGNOSIS — J3089 Other allergic rhinitis: Secondary | ICD-10-CM

## 2015-12-03 DIAGNOSIS — I10 Essential (primary) hypertension: Secondary | ICD-10-CM | POA: Diagnosis not present

## 2015-12-03 DIAGNOSIS — Z01812 Encounter for preprocedural laboratory examination: Secondary | ICD-10-CM | POA: Insufficient documentation

## 2015-12-03 DIAGNOSIS — G43909 Migraine, unspecified, not intractable, without status migrainosus: Secondary | ICD-10-CM | POA: Diagnosis not present

## 2015-12-03 DIAGNOSIS — J45909 Unspecified asthma, uncomplicated: Secondary | ICD-10-CM | POA: Diagnosis not present

## 2015-12-03 DIAGNOSIS — M797 Fibromyalgia: Secondary | ICD-10-CM | POA: Diagnosis not present

## 2015-12-03 DIAGNOSIS — M87812 Other osteonecrosis, left shoulder: Secondary | ICD-10-CM | POA: Insufficient documentation

## 2015-12-03 DIAGNOSIS — Z452 Encounter for adjustment and management of vascular access device: Secondary | ICD-10-CM | POA: Diagnosis not present

## 2015-12-03 DIAGNOSIS — Z96612 Presence of left artificial shoulder joint: Secondary | ICD-10-CM | POA: Diagnosis not present

## 2015-12-03 DIAGNOSIS — Z79891 Long term (current) use of opiate analgesic: Secondary | ICD-10-CM | POA: Diagnosis not present

## 2015-12-03 DIAGNOSIS — F419 Anxiety disorder, unspecified: Secondary | ICD-10-CM | POA: Diagnosis not present

## 2015-12-03 DIAGNOSIS — G8918 Other acute postprocedural pain: Secondary | ICD-10-CM | POA: Diagnosis not present

## 2015-12-03 DIAGNOSIS — E039 Hypothyroidism, unspecified: Secondary | ICD-10-CM | POA: Diagnosis not present

## 2015-12-03 DIAGNOSIS — Z79899 Other long term (current) drug therapy: Secondary | ICD-10-CM | POA: Diagnosis not present

## 2015-12-03 DIAGNOSIS — M5431 Sciatica, right side: Secondary | ICD-10-CM | POA: Diagnosis not present

## 2015-12-03 DIAGNOSIS — M87012 Idiopathic aseptic necrosis of left shoulder: Secondary | ICD-10-CM | POA: Diagnosis not present

## 2015-12-03 DIAGNOSIS — M5030 Other cervical disc degeneration, unspecified cervical region: Secondary | ICD-10-CM | POA: Diagnosis not present

## 2015-12-03 DIAGNOSIS — M19041 Primary osteoarthritis, right hand: Secondary | ICD-10-CM | POA: Diagnosis not present

## 2015-12-03 DIAGNOSIS — M5136 Other intervertebral disc degeneration, lumbar region: Secondary | ICD-10-CM | POA: Diagnosis not present

## 2015-12-03 DIAGNOSIS — Z471 Aftercare following joint replacement surgery: Secondary | ICD-10-CM | POA: Diagnosis not present

## 2015-12-03 DIAGNOSIS — M5432 Sciatica, left side: Secondary | ICD-10-CM | POA: Diagnosis not present

## 2015-12-03 DIAGNOSIS — Z7952 Long term (current) use of systemic steroids: Secondary | ICD-10-CM | POA: Diagnosis not present

## 2015-12-03 DIAGNOSIS — Z79890 Hormone replacement therapy (postmenopausal): Secondary | ICD-10-CM | POA: Diagnosis not present

## 2015-12-03 DIAGNOSIS — Z7951 Long term (current) use of inhaled steroids: Secondary | ICD-10-CM | POA: Diagnosis not present

## 2015-12-03 DIAGNOSIS — G894 Chronic pain syndrome: Secondary | ICD-10-CM | POA: Diagnosis not present

## 2015-12-03 LAB — URINALYSIS, ROUTINE W REFLEX MICROSCOPIC
Bilirubin Urine: NEGATIVE
GLUCOSE, UA: NEGATIVE mg/dL
Hgb urine dipstick: NEGATIVE
KETONES UR: NEGATIVE mg/dL
LEUKOCYTES UA: NEGATIVE
NITRITE: NEGATIVE
PH: 5.5 (ref 5.0–8.0)
Protein, ur: NEGATIVE mg/dL
SPECIFIC GRAVITY, URINE: 1.018 (ref 1.005–1.030)

## 2015-12-03 LAB — ACID FAST CULTURE WITH REFLEXED SENSITIVITIES (MYCOBACTERIA): Acid Fast Culture: NEGATIVE

## 2015-12-03 LAB — SURGICAL PCR SCREEN
MRSA, PCR: NEGATIVE
STAPHYLOCOCCUS AUREUS: NEGATIVE

## 2015-12-03 LAB — ACID FAST CULTURE WITH REFLEXED SENSITIVITIES

## 2015-12-03 NOTE — Progress Notes (Signed)
PCP is Kari Green  Patient denied having any acute cardiac or pulmonary issues. Stress test results in EPIC on 05/08/15. Echocardiogram in EPIC on 09/23/15. Patient denied having a cardiac cath.   Patient has several allergies and is allergic to dye, pain medications, etc, and stated that she needs to have 50 mg of benadryl as needed. Patient also informed Nurse that she was allergic to CHG soap, and that she tried another antibacterial soap, however she developed a reaction to that soap as well. Patient stated she went to the pharmacy and found a antibacterial dye free soap that she would use.   Kari Green, [place] called and informed of this. Prior note in EPIC from Kari Green, Kari Green.

## 2015-12-03 NOTE — Progress Notes (Signed)
Anesthesia Chart Review:  Pt is a 50 year old female scheduled for L shoulder hemi-arthroplasty on 12/04/2015 with Tania Ade, MD.   Pt will not accept blood products per lab order from 07/07/15.   Pt reports she will need central venous access for surgery.  For last surgery 07/16/15, pt had left EJ peripheral IV. This will be decided by assigned anesthesiologist DOS.   - PCP is Thressa Sheller, MD.  - Cardiologist is Minus Breeding MD, last office visit 05/07/15; f/u prn recommended.  -  Pulmonologist is Baltazar Apo, MD.   PMH includes:  HTN, asthma, hypothyroidism, post-op N/V. Former smoker. BMI 23. S/p ACDF 07/16/15.   Medications include: albuterol, levothyroxine, dulera, propranolol  Labs will be obtained DOS.   CXR 10/16/15 reviewed. No active cardiopulmonary disease.  EKG 05/08/15: NSR.   Exercise tolerance test 05/08/15: Negative stress test without evidence of ischemia at given workload.  Echo 09/22/12:  - Left ventricle: The cavity size was normal. Wall thickness was normal. Systolic function was normal. The estimated ejection fraction was in the range of 55% to 60%. Wall motion was normal; there were no regional wall motion abnormalities. Left ventricular diastolic function parameters were normal. - Atrial septum: No defect or patent foramen ovale was identified. - Pericardium, extracardiac: A trivial pericardial effusion was identified posterior to the heart.  If labs acceptable DOS, I anticipate pt can proceed with surgery as scheduled.   Willeen Cass, FNP-BC [facility]/Anesthesiology Phone: [phone] 12/03/2015 2:44 PM

## 2015-12-04 ENCOUNTER — Inpatient Hospital Stay (HOSPITAL_COMMUNITY)
Admission: RE | Admit: 2015-12-04 | Discharge: 2015-12-05 | DRG: 483 | Disposition: A | Discharge status: Home / Self Care | Payer: BLUE CROSS/BLUE SHIELD | Source: Ambulatory Visit | Attending: Orthopedic Surgery | Admitting: Orthopedic Surgery

## 2015-12-04 ENCOUNTER — Inpatient Hospital Stay (HOSPITAL_COMMUNITY): Payer: BLUE CROSS/BLUE SHIELD

## 2015-12-04 ENCOUNTER — Encounter (HOSPITAL_COMMUNITY): Payer: Self-pay | Admitting: Certified Registered Nurse Anesthetist

## 2015-12-04 ENCOUNTER — Inpatient Hospital Stay (HOSPITAL_COMMUNITY): Payer: BLUE CROSS/BLUE SHIELD | Admitting: Vascular Surgery

## 2015-12-04 ENCOUNTER — Encounter (HOSPITAL_COMMUNITY)
Admission: RE | Disposition: A | Discharge status: Home / Self Care | Payer: Self-pay | Source: Ambulatory Visit | Attending: Orthopedic Surgery

## 2015-12-04 DIAGNOSIS — Z79899 Other long term (current) drug therapy: Secondary | ICD-10-CM

## 2015-12-04 DIAGNOSIS — Z96612 Presence of left artificial shoulder joint: Secondary | ICD-10-CM

## 2015-12-04 DIAGNOSIS — M879 Osteonecrosis, unspecified: Principal | ICD-10-CM | POA: Diagnosis present

## 2015-12-04 DIAGNOSIS — Z79891 Long term (current) use of opiate analgesic: Secondary | ICD-10-CM

## 2015-12-04 DIAGNOSIS — Z01818 Encounter for other preprocedural examination: Secondary | ICD-10-CM

## 2015-12-04 DIAGNOSIS — G43909 Migraine, unspecified, not intractable, without status migrainosus: Secondary | ICD-10-CM | POA: Diagnosis present

## 2015-12-04 DIAGNOSIS — M19041 Primary osteoarthritis, right hand: Secondary | ICD-10-CM | POA: Diagnosis present

## 2015-12-04 DIAGNOSIS — J45909 Unspecified asthma, uncomplicated: Secondary | ICD-10-CM | POA: Diagnosis present

## 2015-12-04 DIAGNOSIS — M5136 Other intervertebral disc degeneration, lumbar region: Secondary | ICD-10-CM | POA: Diagnosis present

## 2015-12-04 DIAGNOSIS — M797 Fibromyalgia: Secondary | ICD-10-CM | POA: Diagnosis present

## 2015-12-04 DIAGNOSIS — M5030 Other cervical disc degeneration, unspecified cervical region: Secondary | ICD-10-CM | POA: Diagnosis present

## 2015-12-04 DIAGNOSIS — F419 Anxiety disorder, unspecified: Secondary | ICD-10-CM | POA: Diagnosis present

## 2015-12-04 DIAGNOSIS — Z7951 Long term (current) use of inhaled steroids: Secondary | ICD-10-CM | POA: Diagnosis not present

## 2015-12-04 DIAGNOSIS — G894 Chronic pain syndrome: Secondary | ICD-10-CM | POA: Diagnosis present

## 2015-12-04 DIAGNOSIS — E039 Hypothyroidism, unspecified: Secondary | ICD-10-CM | POA: Diagnosis present

## 2015-12-04 DIAGNOSIS — M5432 Sciatica, left side: Secondary | ICD-10-CM | POA: Diagnosis present

## 2015-12-04 DIAGNOSIS — G8918 Other acute postprocedural pain: Secondary | ICD-10-CM | POA: Diagnosis not present

## 2015-12-04 DIAGNOSIS — Z7952 Long term (current) use of systemic steroids: Secondary | ICD-10-CM | POA: Diagnosis not present

## 2015-12-04 DIAGNOSIS — M87012 Idiopathic aseptic necrosis of left shoulder: Secondary | ICD-10-CM | POA: Diagnosis not present

## 2015-12-04 DIAGNOSIS — Z452 Encounter for adjustment and management of vascular access device: Secondary | ICD-10-CM | POA: Diagnosis not present

## 2015-12-04 DIAGNOSIS — I10 Essential (primary) hypertension: Secondary | ICD-10-CM | POA: Diagnosis present

## 2015-12-04 DIAGNOSIS — Z79890 Hormone replacement therapy (postmenopausal): Secondary | ICD-10-CM | POA: Diagnosis not present

## 2015-12-04 DIAGNOSIS — Z471 Aftercare following joint replacement surgery: Secondary | ICD-10-CM | POA: Diagnosis not present

## 2015-12-04 DIAGNOSIS — M5431 Sciatica, right side: Secondary | ICD-10-CM | POA: Diagnosis present

## 2015-12-04 DIAGNOSIS — J939 Pneumothorax, unspecified: Secondary | ICD-10-CM

## 2015-12-04 HISTORY — PX: SHOULDER HEMI-ARTHROPLASTY: SHX5049

## 2015-12-04 LAB — CBC WITH DIFFERENTIAL/PLATELET
BASOS PCT: 0 %
Basophils Absolute: 0 10*3/uL (ref 0.0–0.1)
Eosinophils Absolute: 0.1 10*3/uL (ref 0.0–0.7)
Eosinophils Relative: 2 %
HEMATOCRIT: 36.6 % (ref 36.0–46.0)
HEMOGLOBIN: 12.3 g/dL (ref 12.0–15.0)
LYMPHS ABS: 2.7 10*3/uL (ref 0.7–4.0)
Lymphocytes Relative: 30 %
MCH: 28.5 pg (ref 26.0–34.0)
MCHC: 33.6 g/dL (ref 30.0–36.0)
MCV: 84.9 fL (ref 78.0–100.0)
MONO ABS: 0.8 10*3/uL (ref 0.1–1.0)
MONOS PCT: 9 %
NEUTROS ABS: 5.3 10*3/uL (ref 1.7–7.7)
Neutrophils Relative %: 59 %
Platelets: 296 10*3/uL (ref 150–400)
RBC: 4.31 MIL/uL (ref 3.87–5.11)
RDW: 13.2 % (ref 11.5–15.5)
WBC: 9 10*3/uL (ref 4.0–10.5)

## 2015-12-04 LAB — COMPREHENSIVE METABOLIC PANEL
ALBUMIN: 3.7 g/dL (ref 3.5–5.0)
ALK PHOS: 56 U/L (ref 38–126)
ALT: 13 U/L — ABNORMAL LOW (ref 14–54)
AST: 18 U/L (ref 15–41)
Anion gap: 7 (ref 5–15)
BUN: 13 mg/dL (ref 6–20)
CALCIUM: 8.9 mg/dL (ref 8.9–10.3)
CO2: 25 mmol/L (ref 22–32)
Chloride: 106 mmol/L (ref 101–111)
Creatinine, Ser: 0.87 mg/dL (ref 0.44–1.00)
Glucose, Bld: 92 mg/dL (ref 65–99)
POTASSIUM: 3.7 mmol/L (ref 3.5–5.1)
Sodium: 138 mmol/L (ref 135–145)
TOTAL PROTEIN: 6.4 g/dL — AB (ref 6.5–8.1)
Total Bilirubin: 0.8 mg/dL (ref 0.3–1.2)

## 2015-12-04 LAB — PROTIME-INR
INR: 1.08
PROTHROMBIN TIME: 14 s (ref 11.4–15.2)

## 2015-12-04 LAB — APTT: aPTT: 29 seconds (ref 24–36)

## 2015-12-04 SURGERY — HEMIARTHROPLASTY, SHOULDER
Anesthesia: Regional | Site: Shoulder | Laterality: Left

## 2015-12-04 MED ORDER — ONDANSETRON HCL 4 MG/2ML IJ SOLN
INTRAMUSCULAR | Status: DC | PRN
  Administered 2015-12-04: 4 mg via INTRAVENOUS

## 2015-12-04 MED ORDER — CEFAZOLIN SODIUM-DEXTROSE 2-4 GM/100ML-% IV SOLN
2.0000 g | Freq: Four times a day (QID) | INTRAVENOUS | Status: AC
  Administered 2015-12-04 – 2015-12-05 (×3): 2 g via INTRAVENOUS
  Filled 2015-12-04 (×3): qty 100

## 2015-12-04 MED ORDER — PROPRANOLOL HCL 20 MG PO TABS
20.0000 mg | ORAL_TABLET | Freq: Two times a day (BID) | ORAL | Status: DC
  Administered 2015-12-04 (×2): 20 mg via ORAL
  Filled 2015-12-04 (×4): qty 1

## 2015-12-04 MED ORDER — POVIDONE-IODINE 7.5 % EX SOLN
Freq: Once | CUTANEOUS | Status: DC
  Filled 2015-12-04: qty 118

## 2015-12-04 MED ORDER — SODIUM CHLORIDE 0.9 % IR SOLN
Status: DC | PRN
Start: 2015-12-04 — End: 2015-12-04
  Administered 2015-12-04: 3000 mL

## 2015-12-04 MED ORDER — ARTIFICIAL TEARS OP OINT
TOPICAL_OINTMENT | OPHTHALMIC | Status: AC
  Filled 2015-12-04: qty 3.5

## 2015-12-04 MED ORDER — ALPRAZOLAM 0.25 MG PO TABS
1.0000 mg | ORAL_TABLET | Freq: Three times a day (TID) | ORAL | Status: DC | PRN
  Administered 2015-12-04 – 2015-12-05 (×3): 1 mg via ORAL
  Filled 2015-12-04: qty 2
  Filled 2015-12-04: qty 4
  Filled 2015-12-04: qty 2
  Filled 2015-12-04: qty 4
  Filled 2015-12-04: qty 2

## 2015-12-04 MED ORDER — PROMETHAZINE HCL 25 MG PO TABS
25.0000 mg | ORAL_TABLET | Freq: Two times a day (BID) | ORAL | Status: DC | PRN
  Administered 2015-12-04: 50 mg via ORAL
  Filled 2015-12-04: qty 2

## 2015-12-04 MED ORDER — KETAMINE HCL-SODIUM CHLORIDE 100-0.9 MG/10ML-% IV SOSY
PREFILLED_SYRINGE | INTRAVENOUS | Status: AC
  Filled 2015-12-04: qty 10

## 2015-12-04 MED ORDER — FLUTICASONE PROPIONATE 50 MCG/ACT NA SUSP
2.0000 | Freq: Every day | NASAL | Status: DC
  Administered 2015-12-05: 2 via NASAL
  Filled 2015-12-04: qty 16

## 2015-12-04 MED ORDER — MIDAZOLAM HCL 2 MG/2ML IJ SOLN
INTRAMUSCULAR | Status: DC | PRN
  Administered 2015-12-04 (×2): 1 mg via INTRAVENOUS

## 2015-12-04 MED ORDER — SODIUM CHLORIDE 0.9 % IV SOLN
INTRAVENOUS | Status: DC
  Administered 2015-12-04: 15:00:00 via INTRAVENOUS

## 2015-12-04 MED ORDER — GLYCOPYRROLATE 0.2 MG/ML IV SOSY
PREFILLED_SYRINGE | INTRAVENOUS | Status: AC
  Filled 2015-12-04: qty 3

## 2015-12-04 MED ORDER — POLYETHYLENE GLYCOL 3350 17 G PO PACK: 17.0000 g | PACK | Freq: Every day | ORAL | Status: DC | PRN

## 2015-12-04 MED ORDER — FENTANYL CITRATE (PF) 100 MCG/2ML IJ SOLN
INTRAMUSCULAR | Status: AC
  Filled 2015-12-04: qty 2

## 2015-12-04 MED ORDER — PROPOFOL 10 MG/ML IV BOLUS
INTRAVENOUS | Status: DC | PRN
  Administered 2015-12-04: 150 mg via INTRAVENOUS
  Administered 2015-12-04: 50 mg via INTRAVENOUS

## 2015-12-04 MED ORDER — LEVOTHYROXINE SODIUM 50 MCG PO TABS
200.0000 ug | ORAL_TABLET | Freq: Every day | ORAL | Status: DC
  Administered 2015-12-04: 200 ug via ORAL
  Filled 2015-12-04: qty 4

## 2015-12-04 MED ORDER — ASPIRIN EC 325 MG PO TBEC
325.0000 mg | DELAYED_RELEASE_TABLET | Freq: Two times a day (BID) | ORAL | Status: DC
  Administered 2015-12-04 – 2015-12-05 (×2): 325 mg via ORAL
  Filled 2015-12-04 (×2): qty 1

## 2015-12-04 MED ORDER — HYDROMORPHONE HCL 1 MG/ML IJ SOLN
0.5000 mg | INTRAMUSCULAR | Status: DC | PRN
  Administered 2015-12-04 – 2015-12-05 (×4): 1 mg via INTRAVENOUS
  Filled 2015-12-04 (×4): qty 1

## 2015-12-04 MED ORDER — HYDROMORPHONE HCL 2 MG PO TABS
2.0000 mg | ORAL_TABLET | ORAL | Status: DC | PRN
  Administered 2015-12-05: 6 mg via ORAL
  Filled 2015-12-04: qty 3

## 2015-12-04 MED ORDER — LIDOCAINE 2% (20 MG/ML) 5 ML SYRINGE
INTRAMUSCULAR | Status: AC
  Filled 2015-12-04: qty 5

## 2015-12-04 MED ORDER — SUMATRIPTAN SUCCINATE 6 MG/0.5ML ~~LOC~~ SOLN
6.0000 mg | Freq: Two times a day (BID) | SUBCUTANEOUS | Status: DC | PRN
  Administered 2015-12-04 – 2015-12-05 (×2): 6 mg via SUBCUTANEOUS
  Filled 2015-12-04 (×4): qty 0.5

## 2015-12-04 MED ORDER — ALBUTEROL SULFATE HFA 108 (90 BASE) MCG/ACT IN AERS: 2.0000 | INHALATION_SPRAY | RESPIRATORY_TRACT | Status: DC | PRN

## 2015-12-04 MED ORDER — LEVOTHYROXINE SODIUM 100 MCG PO TABS: 200.0000 ug | ORAL_TABLET | Freq: Every day | ORAL | Status: DC

## 2015-12-04 MED ORDER — ESTRADIOL 2 MG PO TABS
1.0000 mg | ORAL_TABLET | Freq: Every day | ORAL | Status: DC
  Administered 2015-12-05: 1 mg via ORAL
  Filled 2015-12-04: qty 0.5
  Filled 2015-12-04: qty 1

## 2015-12-04 MED ORDER — ALBUTEROL SULFATE (2.5 MG/3ML) 0.083% IN NEBU: 2.5000 mg | INHALATION_SOLUTION | RESPIRATORY_TRACT | Status: DC | PRN

## 2015-12-04 MED ORDER — PHENYLEPHRINE HCL 10 MG/ML IJ SOLN
INTRAMUSCULAR | Status: DC | PRN
  Administered 2015-12-04: 50 ug/min via INTRAVENOUS

## 2015-12-04 MED ORDER — MOMETASONE FURO-FORMOTEROL FUM 200-5 MCG/ACT IN AERO
2.0000 | INHALATION_SPRAY | Freq: Two times a day (BID) | RESPIRATORY_TRACT | Status: DC
  Administered 2015-12-04 – 2015-12-05 (×2): 2 via RESPIRATORY_TRACT
  Filled 2015-12-04: qty 8.8

## 2015-12-04 MED ORDER — FLEET ENEMA 7-19 GM/118ML RE ENEM: 1.0000 | ENEMA | Freq: Once | RECTAL | Status: DC | PRN

## 2015-12-04 MED ORDER — DIPHENHYDRAMINE HCL 50 MG/ML IJ SOLN
50.0000 mg | INTRAMUSCULAR | Status: DC | PRN
  Administered 2015-12-04 – 2015-12-05 (×4): 50 mg via INTRAVENOUS
  Filled 2015-12-04 (×4): qty 1

## 2015-12-04 MED ORDER — CEFAZOLIN SODIUM-DEXTROSE 2-4 GM/100ML-% IV SOLN
2.0000 g | INTRAVENOUS | Status: AC
  Administered 2015-12-04: 2 g via INTRAVENOUS
  Filled 2015-12-04: qty 100

## 2015-12-04 MED ORDER — LACTATED RINGERS IV SOLN
INTRAVENOUS | Status: DC | PRN
Start: 2015-12-04 — End: 2015-12-04
  Administered 2015-12-04: 07:00:00 via INTRAVENOUS

## 2015-12-04 MED ORDER — MIDAZOLAM HCL 2 MG/2ML IJ SOLN
INTRAMUSCULAR | Status: AC
  Filled 2015-12-04: qty 2

## 2015-12-04 MED ORDER — HYDROXYZINE HCL 25 MG PO TABS
50.0000 mg | ORAL_TABLET | ORAL | Status: DC | PRN
  Administered 2015-12-04: 50 mg via ORAL
  Filled 2015-12-04: qty 2

## 2015-12-04 MED ORDER — SUCCINYLCHOLINE CHLORIDE 20 MG/ML IJ SOLN
INTRAMUSCULAR | Status: DC | PRN
  Administered 2015-12-04: 100 mg via INTRAVENOUS

## 2015-12-04 MED ORDER — KETAMINE HCL 10 MG/ML IJ SOLN
INTRAMUSCULAR | Status: DC | PRN
  Administered 2015-12-04: 20 mg via INTRAVENOUS

## 2015-12-04 MED ORDER — DIPHENHYDRAMINE HCL 50 MG/ML IJ SOLN
INTRAMUSCULAR | Status: DC | PRN
  Administered 2015-12-04: 6.25 mg via INTRAVENOUS

## 2015-12-04 MED ORDER — LIDOCAINE HCL (CARDIAC) 20 MG/ML IV SOLN
INTRAVENOUS | Status: DC | PRN
  Administered 2015-12-04: 60 mg via INTRATRACHEAL

## 2015-12-04 MED ORDER — LACTATED RINGERS IV SOLN
INTRAVENOUS | Status: DC | PRN
Start: 2015-12-04 — End: 2015-12-04
  Administered 2015-12-04 (×2): via INTRAVENOUS

## 2015-12-04 MED ORDER — 0.9 % SODIUM CHLORIDE (POUR BTL) OPTIME
TOPICAL | Status: DC | PRN
  Administered 2015-12-04: 1000 mL

## 2015-12-04 MED ORDER — GLYCOPYRROLATE 0.2 MG/ML IJ SOLN
INTRAMUSCULAR | Status: DC | PRN
  Administered 2015-12-04 (×2): 0.1 mg via INTRAVENOUS

## 2015-12-04 MED ORDER — DIPHENHYDRAMINE HCL 25 MG PO TABS
50.0000 mg | ORAL_TABLET | ORAL | Status: DC | PRN
  Filled 2015-12-04: qty 2

## 2015-12-04 MED ORDER — SUMATRIPTAN SUCCINATE 100 MG PO TABS
100.0000 mg | ORAL_TABLET | ORAL | Status: DC | PRN
  Filled 2015-12-04: qty 1

## 2015-12-04 MED ORDER — FENTANYL CITRATE (PF) 100 MCG/2ML IJ SOLN
INTRAMUSCULAR | Status: DC | PRN
  Administered 2015-12-04 (×2): 50 ug via INTRAVENOUS

## 2015-12-04 MED ORDER — PROPOFOL 500 MG/50ML IV EMUL
INTRAVENOUS | Status: DC | PRN
  Administered 2015-12-04: 25 ug/kg/min via INTRAVENOUS

## 2015-12-04 MED ORDER — TOPIRAMATE 25 MG PO TABS
75.0000 mg | ORAL_TABLET | Freq: Two times a day (BID) | ORAL | Status: DC
  Administered 2015-12-04 – 2015-12-05 (×3): 75 mg via ORAL
  Filled 2015-12-04 (×3): qty 3

## 2015-12-04 MED ORDER — DIPHENHYDRAMINE HCL 50 MG/ML IJ SOLN
INTRAMUSCULAR | Status: AC
  Filled 2015-12-04: qty 1

## 2015-12-04 MED ORDER — ONDANSETRON HCL 4 MG/2ML IJ SOLN
INTRAMUSCULAR | Status: AC
  Filled 2015-12-04: qty 2

## 2015-12-04 SURGICAL SUPPLY — 70 items
BLADE SAW SAG 73X25 THK (BLADE)
BLADE SAW SGTL 73X25 THK (BLADE) IMPLANT
BLADE SURG 15 STRL LF DISP TIS (BLADE) IMPLANT
BLADE SURG 15 STRL SS (BLADE)
BOWL SMART MIX CTS (DISPOSABLE) IMPLANT
CAPT HIP TOTAL TH-1 ×2 IMPLANT
CHLORAPREP W/TINT 26ML (MISCELLANEOUS) IMPLANT
COVER SURGICAL LIGHT HANDLE (MISCELLANEOUS) ×2 IMPLANT
DRAPE IMP U-DRAPE 54X76 (DRAPES) ×4 IMPLANT
DRAPE INCISE IOBAN 66X45 STRL (DRAPES) ×2 IMPLANT
DRAPE ORTHO SPLIT 77X108 STRL (DRAPES) ×2
DRAPE SURG 17X23 STRL (DRAPES) ×2 IMPLANT
DRAPE SURG ORHT 6 SPLT 77X108 (DRAPES) ×2 IMPLANT
DRAPE U-SHAPE 47X51 STRL (DRAPES) ×2 IMPLANT
DRSG AQUACEL AG ADV 3.5X10 (GAUZE/BANDAGES/DRESSINGS) ×2 IMPLANT
DRSG MEPILEX BORDER 4X4 (GAUZE/BANDAGES/DRESSINGS) IMPLANT
DRSG MEPILEX BORDER 4X8 (GAUZE/BANDAGES/DRESSINGS) IMPLANT
DRSG PAD ABDOMINAL 8X10 ST (GAUZE/BANDAGES/DRESSINGS) IMPLANT
DURAPREP 26ML APPLICATOR (WOUND CARE) ×2 IMPLANT
ELECT BLADE 4.0 EZ CLEAN MEGAD (MISCELLANEOUS) ×2
ELECT CAUTERY BLADE 6.4 (BLADE) ×2 IMPLANT
ELECT REM PT RETURN 9FT ADLT (ELECTROSURGICAL) ×2
ELECTRODE BLDE 4.0 EZ CLN MEGD (MISCELLANEOUS) ×1 IMPLANT
ELECTRODE REM PT RTRN 9FT ADLT (ELECTROSURGICAL) ×1 IMPLANT
EVACUATOR 1/8 PVC DRAIN (DRAIN) IMPLANT
GAUZE SPONGE 4X4 12PLY STRL (GAUZE/BANDAGES/DRESSINGS) IMPLANT
GLOVE BIO SURGEON STRL SZ7 (GLOVE) ×2 IMPLANT
GLOVE BIO SURGEON STRL SZ7.5 (GLOVE) ×2 IMPLANT
GLOVE BIOGEL PI IND STRL 8 (GLOVE) ×1 IMPLANT
GLOVE BIOGEL PI INDICATOR 8 (GLOVE) ×1
GOWN STRL REUS W/ TWL LRG LVL3 (GOWN DISPOSABLE) ×3 IMPLANT
GOWN STRL REUS W/TWL LRG LVL3 (GOWN DISPOSABLE) ×3
HANDPIECE INTERPULSE COAX TIP (DISPOSABLE) ×1
HEMOSTAT SURGICEL 2X14 (HEMOSTASIS) IMPLANT
HOOD PEEL AWAY FACE SHEILD DIS (HOOD) ×6 IMPLANT
KIT BASIN OR (CUSTOM PROCEDURE TRAY) ×2 IMPLANT
KIT ROOM TURNOVER OR (KITS) ×2 IMPLANT
MANIFOLD NEPTUNE II (INSTRUMENTS) ×2 IMPLANT
NEEDLE HYPO 25GX1X1/2 BEV (NEEDLE) IMPLANT
NEEDLE MAYO TROCAR (NEEDLE) ×2 IMPLANT
NS IRRIG 1000ML POUR BTL (IV SOLUTION) ×2 IMPLANT
PACK SHOULDER (CUSTOM PROCEDURE TRAY) ×2 IMPLANT
PACK UNIVERSAL I (CUSTOM PROCEDURE TRAY) IMPLANT
PAD ARMBOARD 7.5X6 YLW CONV (MISCELLANEOUS) ×4 IMPLANT
RETRIEVER SUT HEWSON (MISCELLANEOUS) ×2 IMPLANT
SET HNDPC FAN SPRY TIP SCT (DISPOSABLE) ×1 IMPLANT
SLING ARM FOAM STRAP LRG (SOFTGOODS) ×2 IMPLANT
SLING ARM IMMOBILIZER LRG (SOFTGOODS) ×2 IMPLANT
SLING ARM IMMOBILIZER MED (SOFTGOODS) IMPLANT
SPONGE LAP 18X18 X RAY DECT (DISPOSABLE) ×2 IMPLANT
STRIP CLOSURE SKIN 1/2X4 (GAUZE/BANDAGES/DRESSINGS) ×2 IMPLANT
SUCTION FRAZIER HANDLE 10FR (MISCELLANEOUS) ×1
SUCTION TUBE FRAZIER 10FR DISP (MISCELLANEOUS) ×1 IMPLANT
SUPPORT WRAP ARM LG (MISCELLANEOUS) IMPLANT
SUT ETHIBOND NAB CT1 #1 30IN (SUTURE) ×4 IMPLANT
SUT FIBERWIRE #2 38 T-5 BLUE (SUTURE)
SUT MNCRL AB 4-0 PS2 18 (SUTURE) ×2 IMPLANT
SUT VIC AB 0 CTB1 27 (SUTURE) IMPLANT
SUT VIC AB 2-0 CT1 27 (SUTURE) ×1
SUT VIC AB 2-0 CT1 TAPERPNT 27 (SUTURE) ×1 IMPLANT
SUTURE FIBERWR #2 38 T-5 BLUE (SUTURE) IMPLANT
SYR CONTROL 10ML LL (SYRINGE) IMPLANT
TAPE FIBER 2MM 7IN #2 BLUE (SUTURE) IMPLANT
TAPE LABRALWHITE 1.5X36 (TAPE) ×4 IMPLANT
TOWEL OR 17X24 6PK STRL BLUE (TOWEL DISPOSABLE) ×2 IMPLANT
TOWEL OR 17X26 10 PK STRL BLUE (TOWEL DISPOSABLE) ×2 IMPLANT
TRAY FOLEY CATH 16FRSI W/METER (SET/KITS/TRAYS/PACK) IMPLANT
TUBE CONNECTING 12X1/4 (SUCTIONS) ×2 IMPLANT
WATER STERILE IRR 1000ML POUR (IV SOLUTION) ×2 IMPLANT
YANKAUER SUCT BULB TIP NO VENT (SUCTIONS) ×2 IMPLANT

## 2015-12-04 NOTE — H&P (Signed)
Kari Green is an 50 y.o. female.   Chief Complaint: L shoulder pain HPI: L shoulder worsening AVN, failed conservative management and arthroscopic procedure.  Pain limits sleep and quality of life.  Past Medical History:  Diagnosis Date  . Anxiety   . Arthritis    right thumb  . Asthma   . Cancer [facility])    history of skin cancer  . Chronic, continuous use of opioids   . Cognitive dysfunction    patient denies  . Complication of anesthesia    "not asleep and aware of everything"  . Degenerative disc disease, cervical   . Degenerative disc disease, lumbar   . Delayed gastric emptying   . Dental crowns present   . Difficult intravenous access   . Difficulty swallowing pills   . Esophageal dysmotility   . Family history of adverse reaction to anesthesia    family - itching, hives, PONV  . Fibromyalgia   . Hemangioma of liver   . Hemangioma, renal   . History of bronchitis   . History of hiatal hernia   . History of kidney stones 04/2014  . History of pneumonia   . Hypertension   . Hypothyroidism (acquired)   . IC (interstitial cystitis)   . Irritable bowel syndrome (IBS)   . Kidney stone   . Knee pain, right    states gets "nerves burned" periodically  . Limited joint range of motion    right knee - unable to fully extend right leg  . Memory impairment    due to pain   . Migraines   . Osteoporosis   . Pain management   . Pain syndrome, chronic   . Pneumonia 2016  . PONV (postoperative nausea and vomiting)    uncontrollable itching with anesthesia; hx. of anesthesia awareness; pt needs 50  mg of Benadryl  . PONV (postoperative nausea and vomiting)    "need medication before and need medication afterwards"  . Sciatica    bilateral  . Seasonal allergies     Past Surgical History:  Procedure Laterality Date  . ABDOMINAL HYSTERECTOMY    . ANTERIOR CERVICAL DECOMP/DISCECTOMY FUSION  03/10/2009   C5-6, C6-7  . ANTERIOR CERVICAL DECOMP/DISCECTOMY FUSION N/A  07/16/2015   Procedure: ANTERIOR CERVICAL DECOMPRESSION FUSION 4-5 WITH INSTRUMENTATION AND AUTOGRAFT;  Surgeon: Phylliss Bob, MD;  Location: [place];  Service: Orthopedics;  Laterality: N/A;  ANTERIOR CERVICAL DECOMPRESSION FUSION 4-5 WITH INSTRUMENTATION AND ALLOGRAFT  . APPENDECTOMY  ~ 1992  . AUGMENTATION MAMMAPLASTY Bilateral 1996; 2001, 2016  . BREAST SURGERY  1980's   accessory breast tissue exc.  Marland Kitchen CARPAL TUNNEL RELEASE Right 07/26/2001  . CARPAL TUNNEL RELEASE Left   . CARPOMETACARPEL SUSPENSION PLASTY Right 02/10/2015   Procedure: RIGHT THUMB SUSPENSION  ARTHROPLASTY;  Surgeon: Milly Jakob, MD;  Location: [place];  Service: Orthopedics;  Laterality: Right;  ANESTHESIA: PRE- OP BLOCK  . COLONOSCOPY W/ POLYPECTOMY    . CYSTO WITH HYDRODISTENSION  01/29/2011   Procedure: CYSTOSCOPY/HYDRODISTENSION;  Surgeon: Claybon Jabs, MD;  Location: [facility];  Service: Urology;  Laterality: N/A;  . CYSTO WITH HYDRODISTENSION N/A 05/08/2014   Procedure: CYSTOSCOPY/HYDRODISTENSION WITH BILATERAL RETROGRADES;  Surgeon: Ardis Hughs, MD;  Location: [facility];  Service: Urology;  Laterality: N/A;  . CYSTO WITH HYDRODISTENSION N/A 03/27/2015   Procedure: CYSTOSCOPY/HYDRODISTENSION OF BLADDER, INSTILLATION OF MARCAINE/PYRIDIUM;  Surgeon: Ardis Hughs, MD;  Location: [facility];  Service: Urology;  Laterality: N/A;  .  CYSTO/  BLADDER BX  03/08/2008  . CYSTO/ URETHRAL DILATION/ HYDRODISTENTION/ BLADDER BX  04/10/2003  . CYSTOSCOPY W/ RETROGRADES  01/29/2011   Procedure: CYSTOSCOPY WITH RETROGRADE PYELOGRAM;  Surgeon: Claybon Jabs, MD;  Location: [facility];  Service: Urology;  Laterality: Bilateral;  . CYSTOSCOPY WITH STENT PLACEMENT  2006   URETERAL  . DEBRIDEMENT RIGHT THUMB MP JOINT Right 04/10/2002  . DECOMPRESSION LEFT ULNAR NERVE, ELBOW Left 01/29/2003  . ESOPHAGOGASTRODUODENOSCOPY (EGD) WITH ESOPHAGEAL  DILATION  10-26-2000  . EXCISION HAGLUND'S DEFORMITY WITH ACHILLES TENDON REPAIR Bilateral 2013-2014  . EYE SURGERY Bilateral    x4  . FISSURECTOMY  ~ 1988 x2  . GANGLION CYST EXCISION Right 02/10/2015   Procedure: REMOVAL GANGLION OF WRIST, PARTIAL ULNAR EXCISION;  Surgeon: Milly Jakob, MD;  Location: [place];  Service: Orthopedics;  Laterality: Right;  . HAND SURGERY Right 01-31-15  . HEMORROIDECTOMY    . KIDNEY STONE SURGERY    . KNEE ARTHROSCOPY Right 08/16/2000  . KNEE ARTHROSCOPY W/ LATERAL RELEASE Right 11/29/2000   states 13 surgeries on right knee  . KNEE ARTHROSCOPY WITH FULKERSON SLIDE Right 11/29/2000  . LAPAROSCOPIC ASSISTED VAGINAL HYSTERECTOMY  Feb 1999  . LAPAROSCOPIC CHOLECYSTECTOMY  03/04/2002  . LAPAROSCOPIC LYSIS OF ADHESIONS  02/11/2012   Procedure: LAPAROSCOPIC LYSIS OF ADHESIONS;  Surgeon: Cheri Fowler, MD;  Location: [facility];  Service: Gynecology;;  . LAPAROSCOPY  02/11/2012   Procedure: LAPAROSCOPY DIAGNOSTIC;  Surgeon: Cheri Fowler, MD;  Location: [facility];  Service: Gynecology;  Laterality: N/A;  Diagnostic  Operative Laparoscopic   . LAPAROSCOPY ADHESIOLYSIS AND REMOVAL POSSIBLE RIGHT OVARY REMNANT  04/18/2000  . LAPAROSCOPY LYSIS ADHESIONS/  BILATERAL SALPINGOOPHORECTOMY/  [place] OF ENDOMETRIOSIS  1998  . MELANOMA EXCISION Left 2013   "behind knee"   . MELANOMA EXCISION     scalp  . NASAL SINUS SURGERY  2013  . NEGATIVE SLEEP STUDY  2006 approx .  per pt  . REFRACTIVE SURGERY Bilateral ~ 1999; ~ 2013  . RIGHT THUMB FUSION OF MPJ Right 10/03/2002  . RIGHT TOTAL KNEE REVISION ARTHROPLASTY Right 03/14/2007  . SHOULDER SURGERY Left   . TONSILLECTOMY AND ADENOIDECTOMY  ~ 1989  . TOTAL KNEE ARTHROPLASTY Right 09/10/2003; 12/08/2012   x3  . TOTAL THYROIDECTOMY  1993  . TRANSANAL HEMORRHOIDAL DEARTERIALIZATION  03/26/2011  . TRANSTHORACIC ECHOCARDIOGRAM  09/22/2012  . VIDEO BRONCHOSCOPY Bilateral 10/20/2015   Procedure: VIDEO BRONCHOSCOPY  WITHOUT FLUORO;  Surgeon: Collene Gobble, MD;  Location: [place];  Service: Cardiopulmonary;  Laterality: Bilateral;    Family History  Problem Relation Age of Onset  . Anesthesia problems Mother   . Cancer Mother     ovarian  . Leukemia Mother   . Cancer Daughter     cervical  . Cancer Maternal Aunt     breast   Social History:  reports that she quit smoking about 9 years ago. She has never used smokeless tobacco. She reports that she drinks alcohol. She reports that she does not use drugs.  Allergies:  Allergies  Allergen Reactions  . Cobalt Hives, Swelling and Other (See Comments)    DESTRUCTION OF BONE  . Latex Hives and Swelling    TIGHTNESS IN THROAT  . Nickel Hives, Swelling and Other (See Comments)    DESTRUCTION OF BONE  . Peanuts [Peanut Oil] Hives, Swelling and Other (See Comments)    TIGHTNESS IN THROAT  . Red Dye Hives and Itching  . Azithromycin Swelling  Undefined area  . Chloraprep One Step [Chlorhexidine Gluconate] Other (See Comments)    CHEMICAL BURN, BLISTERS  . Corticosteroids Swelling  . Eggs Or Egg-Derived Products Hives and Itching  . Flexeril [Cyclobenzaprine] Swelling    Undefined area  . Morphine And Related Hives and Itching    ALL PAIN MEDS - MUST HAVE BENADRYL PRIOR TO PAIN MED.  . Other Hives and Other (See Comments)    Other reaction(s): Other Burns skin ALL METALS - HIVES, SWELLING Over the counter cough syrup- hives.  Antibiotic given for tick bites- swelling, hives, rash LOTIONS - HIVES (DUE TO THE DYE) FRAGRANCES - CAUSES MIGRAINES  . Percocet [Oxycodone-Acetaminophen] Swelling    Undefined area  . Supartz [Sodium Hyaluronate] Hives and Swelling  . Surgical Lubricant Swelling  . Yellow Dyes (Non-Tartrazine) Hives       . Prednisone Other (See Comments)  . Soap Itching    DIAL, DOVE  . Tape Other (See Comments)    BURNS SKIN    Facility-Administered Medications Prior to Admission  Medication Dose Route Frequency  Provider Last Rate Last Dose  . ropivacaine (PF) 2 mg/ml (0.2%) (NAROPIN) epidural 9 mL  9 mL Other Once Milinda Pointer, MD       Medications Prior to Admission  Medication Sig Dispense Refill  . albuterol (PROAIR HFA) 108 (90 Base) MCG/ACT inhaler Inhale 2 puffs into the lungs every 4 (four) hours as needed for wheezing or shortness of breath. 1 Inhaler 1  . albuterol (PROVENTIL) (2.5 MG/3ML) 0.083% nebulizer solution Take 3 mLs (2.5 mg total) by nebulization every 4 (four) hours as needed for wheezing (or coughing spells). 75 vial 1  . ALPRAZolam (XANAX) 0.5 MG tablet Take 1 mg by mouth 3 (three) times daily as needed. Anxiety    . cyanocobalamin (,VITAMIN B-12,) 1000 MCG/ML injection Inject 1,000 mcg into the muscle once a week. Reported on 03/25/2015    . diphenhydrAMINE (BENADRYL) 50 MG tablet Take 50 mg by mouth every 4 (four) hours as needed for itching (prn , alternates with hydroxyzine).     Marland Kitchen estradiol (ESTRACE) 0.5 MG tablet Take 1 mg by mouth at bedtime.     . fluticasone (FLONASE) 50 MCG/ACT nasal spray PLACE 2 SPRAYS INTO BOTH NOSTRILS DAILY. 16 g 5  . HYDROmorphone (DILAUDID) 2 MG tablet Take 0.5-1 tablets (1-2 mg total) by mouth every 4 (four) hours as needed for moderate pain or severe pain. (Patient taking differently: Take 1 mg by mouth 4 (four) times daily. ) 30 tablet 0  . hydrOXYzine (ATARAX/VISTARIL) 25 MG tablet Take 50 mg by mouth every 4 (four) hours as needed for itching. Alternates with benadryl    . ketoconazole (NIZORAL) 2 % shampoo APPLICATIONS APPLY ON THE SKIN WASH ONCE WEEKLY.  3  . levothyroxine (SYNTHROID, LEVOTHROID) 50 MCG tablet Take 200 mcg by mouth daily. Pt can only take name brand    . MethylPREDNISolone Acetate (DEPO-MEDROL IJ) Inject 1 each as directed daily as needed (allergic reaction.).    Marland Kitchen mometasone-formoterol (DULERA) 200-5 MCG/ACT AERO Inhale 2 puffs into the lungs 2 (two) times daily. 13 g 5  . Multiple Vitamin (MULTIVITAMIN WITH MINERALS)  TABS tablet Take 1 tablet by mouth at bedtime.    . promethazine (PHENERGAN) 25 MG tablet Take 25-50 mg by mouth 2 (two) times daily as needed for nausea or vomiting.     . propranolol (INDERAL) 20 MG tablet Take 20 mg by mouth 2 (two) times daily.    Marland Kitchen  SUMAtriptan (IMITREX) 100 MG tablet Take 1 tablet at earliest onset of headache.  May repeat once in 2 hours if headache persists or recurs.  Do not exceed 2 tabs in 24 hours 10 tablet 2  . SUMAtriptan 6 MG/0.5ML SOAJ Inject 6 mg into the skin as directed. (Patient taking differently: Inject 6 mg into the skin daily as needed (for migraine headache.). ) 3 mL 4  . topiramate (TOPAMAX) 25 MG tablet Take 75 mg by mouth 2 (two) times daily.  1  . UNABLE TO FIND once a week. Allergy injections - once  a week 2 shots    . EPINEPHrine (EPIPEN 2-PAK) 0.3 mg/0.3 mL IJ SOAJ injection Inject 0.3 mLs (0.3 mg total) into the muscle as needed (in the event of a severe life-threatening reaction). 2 Device 2  . EVZIO 0.4 MG/0.4ML SOAJ AUTO INJECT AS NEEDED IN CASE OF EMERGENCY WHERE GRANDCHILDREN MAY GET A HOLD OF PATIENTS PAIN MEDICATIONS.  0  . OnabotulinumtoxinA (BOTOX IJ) Inject as directed every 3 (three) months. For migraines    . Zoledronic Acid (RECLAST IV) Inject into the vein as directed. Yearly --  Last dose Oct 2015      Results for orders placed or performed during the hospital encounter of 12/03/15 (from the past 48 hour(s))  Surgical pcr screen     Status: None   Collection Time: 12/03/15  1:38 PM  Result Value Ref Range   MRSA, PCR NEGATIVE NEGATIVE   Staphylococcus aureus NEGATIVE NEGATIVE    Comment:        The Xpert SA Assay (FDA approved for NASAL specimens in patients over 65 years of age), is one component of a comprehensive surveillance program.  Test performance has been validated by [facility] for patients greater than or equal to 2 year old. It is not intended to diagnose infection nor to guide or monitor treatment.    Urinalysis, Routine w reflex microscopic (not at [facility])     Status: None   Collection Time: 12/03/15  2:36 PM  Result Value Ref Range   Color, Urine YELLOW YELLOW   APPearance CLEAR CLEAR   Specific Gravity, Urine 1.018 1.005 - 1.030   pH 5.5 5.0 - 8.0   Glucose, UA NEGATIVE NEGATIVE mg/dL   Hgb urine dipstick NEGATIVE NEGATIVE   Bilirubin Urine NEGATIVE NEGATIVE   Ketones, ur NEGATIVE NEGATIVE mg/dL   Protein, ur NEGATIVE NEGATIVE mg/dL   Nitrite NEGATIVE NEGATIVE   Leukocytes, UA NEGATIVE NEGATIVE    Comment: MICROSCOPIC NOT DONE ON URINES WITH NEGATIVE PROTEIN, BLOOD, LEUKOCYTES, NITRITE, OR GLUCOSE <1000 mg/dL.   No results found.  Review of Systems  All other systems reviewed and are negative.   Blood pressure 121/77, pulse 64, temperature 98.1 F (36.7 C), temperature source Oral, resp. rate 20, height 5' 2.5" (1.588 m), weight 58.9 kg (129 lb 14 oz), SpO2 100 %. Physical Exam  Constitutional: She is oriented to person, place, and time. She appears well-developed and well-nourished.  HENT:  Head: Atraumatic.  Eyes: EOM are normal.  Cardiovascular: Intact distal pulses.   Respiratory: Effort normal.  Musculoskeletal:  L shoulder pain with ROM, NVID  Neurological: She is alert and oriented to person, place, and time.  Skin: Skin is warm and dry.  Psychiatric: She has a normal mood and affect.     Assessment/Plan  L shoulder worsening AVN, failed conservative management and arthroscopic procedure.  Pain limits sleep and quality of life. Plan L shoulder hemiarthroplasty Risks /  benefits of surgery discussed Consent on chart  NPO for OR Preop antibiotics   Nita Sells, MD 12/04/2015, 7:23 AM

## 2015-12-04 NOTE — Anesthesia Postprocedure Evaluation (Signed)
Anesthesia Post Note  Patient: Kari Green  Procedure(s) Performed: Procedure(s) (LRB): SHOULDER HEMI-ARTHROPLASTY (Left)  Patient location during evaluation: PACU Anesthesia Type: General Level of consciousness: awake Pain management: pain level controlled Vital Signs Assessment: post-procedure vital signs reviewed and stable Respiratory status: spontaneous breathing Cardiovascular status: stable Anesthetic complications: no    Last Vitals:  Vitals:   12/04/15 1325 12/04/15 1355  BP:  110/72  Pulse:  (!) 57  Resp:  16  Temp: 36.3 C 36.7 C    Last Pain:  Vitals:   12/04/15 1355  TempSrc: Oral  PainSc:                  EDWARDS,Numa Heatwole

## 2015-12-04 NOTE — Transfer of Care (Signed)
Immediate Anesthesia Transfer of Care Note  Patient: Kari Green  Procedure(s) Performed: Procedure(s) with comments: SHOULDER HEMI-ARTHROPLASTY (Left) - Left shoulder hemiarthroplasty  Patient Location: PACU  Anesthesia Type:General  Level of Consciousness: awake, alert  and patient cooperative  Airway & Oxygen Therapy: Patient Spontanous Breathing and Patient connected to nasal cannula oxygen  Post-op Assessment: Report given to RN, Post -op Vital signs reviewed and stable, Patient moving all extremities X 4 and Patient able to stick tongue midline  Post vital signs: Reviewed and stable  Last Vitals:  Vitals:   12/04/15 0651  BP: 121/77  Pulse: 64  Resp: 20  Temp: 36.7 C    Last Pain:  Vitals:   12/04/15 0654  TempSrc:   PainSc: 6          Complications: No apparent anesthesia complications

## 2015-12-04 NOTE — Progress Notes (Signed)
Patient refused lab blood draw by phlebotomist stating that she was told by her physician that she would be given a central line. 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 Dr. Oletta Lamas was informed of patient request and discussed the differences of peripheral IV  and central line indications. Patient became very anxious and stated "My doctor told me I could have a central line right off the bat, and that I would not have to be stuck several times" . Dr Oletta Lamas spoke with Dr. Tamera Punt re: the situation and the reason for the delay of performing the block. Prior to beginning the central line insertion the patient's husband was requested to step out for insertion of central line and block. Patient stated that she has a doctors note that he did not have to leave the room. Patient was advised that this request was unable to be fulfilled. Husband was agreeable to wait in another room during the procedures. Her was updated between each one on her status and was allowed back as soon as the block was complete. Three rings were removed (1 from right hand and two from the left) and given to husband as the patient rolled out of the holding area. Patient was asked to remove the posts from the inner cartilage of both ears and she stated they could not be removed per her migraine doctor. CRNA stated he would advise the OR team of the remaining jewelery.

## 2015-12-04 NOTE — Anesthesia Procedure Notes (Signed)
Anesthesia Procedure Note RIJ CVP Dual Lumen: [id number]: The patient was identified and consent obtained.  TO was performed, and full barrier precautions were used.  The skin was anesthetized with lidocaine.  Once the vein was located with the 22 ga. needle using ultrasound guidance , the wire was inserted into the vein.  The wire location was confirmed with ultrasound.  The tissue was dilated and the catheter was carefully inserted, then sutured in place. A dressing was applied. The patient tolerated the procedure well.  CE

## 2015-12-04 NOTE — Discharge Instructions (Signed)
Discharge Instructions after Shoulder Hemiarthroplasty ° ° °A sling has been provided for you. Remove the sling 5 times each day to perform motion exercises. After the first 48 to 72 hours, discontinue using the sling. You should use the sling as a protective device, if you are in a crowd.  °Use ice on the shoulder intermittently over the first 48 hours after surgery.  °Pain medication has been prescribed for you.  °Use your medication liberally over the first 48 hours, and then begin to taper your use. You may take Extra Strength Tylenol or Tylenol only in place of the pain pills. DO NOT take ANY nonsteroidal anti-inflammatory pain medications: Advil, Motrin, Ibuprofen, Aleve, Naproxen, or Naprosyn. °Take one aspirin a day for 2 weeks after surgery, unless you have an aspirin sensitivity/allergy or asthma. °Leave your dressing on until your first follow up visit.  You may shower with the dressing.  Hold your arm as if you still have your sling on while you shower. °Active reaching and lifting are not permitted. You may use the operative arm for activities of daily living that do not require the operative arm to leave the side of the body, such as eating, drinking, bathing, etc.  °Three to 5 times each day you should perform assisted overhead reaching and external rotation (outward turning) exercises with the operative arm. You were taught these exercises prior to discharge. Both exercises should be done with the non-operative arm used as the "therapist arm" while the operative arm remains relaxed. Ten of each exercise should be done three to five times each day. ° ° °Overhead reach is helping to lift your stiff arm up as high as it will go. To stretch your overhead reach, lie flat on your back, relax, and grasp the wrist of the tight shoulder with your opposite hand. Using the power in your opposite arm, bring the stiff arm up as far as it is comfortable. Start holding it for ten seconds and then work up to where  you can hold it for a count of 30. Breathe slowly and deeply while the arm is moved. Repeat this stretch ten times, trying to help the ar up a little higher each time.  ° ° ° °External rotation is turning the arm out to the side while your elbow stays close to your body. External rotation is best stretched while you are lying on your back. Hold a cane, yardstick, broom handle, or dowel in both hands. Bend both elbows to a right angle. Use steady, gentle force from your normal arm to rotate the hand of the stiff shoulder out away from your body. Continue the rotation as far as it will go comfortably, holding it there for a count of 10. Repeat this exercise ten times.  ° ° ° ° °Please call [phone] during normal business hours or [phone] after hours for any problems. Including the following: ° °- excessive redness of the incisions °- drainage for more than 4 days °- fever of more than 101.5 F ° °*Please note that pain medications will not be refilled after hours or on weekends. ° ° ° °

## 2015-12-04 NOTE — Anesthesia Procedure Notes (Signed)
Procedure Name: Intubation Date/Time: 12/04/2015 8:00 AM Performed by: Finis Bud Pre-anesthesia Checklist: Patient identified, Emergency Drugs available, Suction available and Patient being monitored Patient Re-evaluated:Patient Re-evaluated prior to inductionOxygen Delivery Method: Circle system utilized Preoxygenation: Pre-oxygenation with 100% oxygen Intubation Type: IV induction Laryngoscope Size: Glidescope (T4) Tube type: Oral Number of attempts: 1 Placement Confirmation: ETT inserted through vocal cords under direct vision,  positive ETCO2 and breath sounds checked- equal and bilateral Secured at: 22 cm Tube secured with: Tape Dental Injury: Teeth and Oropharynx as per pre-operative assessment

## 2015-12-04 NOTE — Anesthesia Preprocedure Evaluation (Addendum)
Anesthesia Evaluation  Patient identified by MRN, date of birth, ID band Patient awake    Reviewed: Allergy & Precautions, NPO status , Patient's Chart, lab work & pertinent test results  History of Anesthesia Complications (+) PONV  Airway Mallampati: II  TM Distance: >3 FB Neck ROM: Full    Dental   Pulmonary asthma , former smoker,    breath sounds clear to auscultation       Cardiovascular hypertension,  Rhythm:Regular Rate:Normal     Neuro/Psych  Headaches,    GI/Hepatic hiatal hernia,   Endo/Other  Hypothyroidism   Renal/GU Renal disease     Musculoskeletal  (+) Arthritis , Fibromyalgia -  Abdominal   Peds  Hematology   Anesthesia Other Findings   Reproductive/Obstetrics                            Anesthesia Physical Anesthesia Plan  ASA: III  Anesthesia Plan: General   Post-op Pain Management: GA combined w/ Regional for post-op pain   Induction: Intravenous  Airway Management Planned:   Additional Equipment:   Intra-op Plan:   Post-operative Plan: Extubation in OR  Informed Consent:   Dental advisory given  Plan Discussed with: CRNA, Anesthesiologist and Surgeon  Anesthesia Plan Comments:        Anesthesia Quick Evaluation

## 2015-12-04 NOTE — Op Note (Signed)
Procedure(s): SHOULDER HEMI-ARTHROPLASTY Procedure Note  Kari Green female 50 y.o. 12/04/2015  Procedure(s) and Anesthesia Type:    *LEFT SHOULDER HEMI-ARTHROPLASTY - Choice  Surgeon(s) and Role:    * Tania Ade, MD - Primary   Indications:  50 y.o. female  With left shoulder avascular necrosis, failed conservative measures including an arthroscopic procedure. Radiographic findings of avascular necrosis worsened over the last couple of years. Symptoms progressed and she failed conservative measures.. Pain and dysfunction interfered with quality of life and nonoperative treatment with activity modification, NSAIDS and injections failed.     Surgeon: Nita Sells   Assistants: Jeanmarie Hubert PA-C [facility] was present and scrubbed throughout the procedure and was essential in positioning, retraction, exposure, and closure)  Anesthesia: General endotracheal anesthesia with preoperative interscalene block given by the attending anesthesiologist      Procedure Detail  SHOULDER HEMI-ARTHROPLASTY  Findings: Tornier flex anatomic press-fit size 3 stem with a 43 low offset head.  A lesser tuberosity osteotomy was performed and repaired at the conclusion of the procedure.  Estimated Blood Loss:  200 mL         Drains: None   Blood Given: none          Specimens: none        Complications:  * No complications entered in OR log *         Disposition: PACU - hemodynamically stable.         Condition: stable    Procedure:   The patient was identified in the preoperative holding area where I personally marked the operative extremity after verifying with the patient and consent. She  was taken to the operating room where She was transferred to the   operative table.  The patient received an interscalene block in   the holding area by the attending anesthesiologist.  General anesthesia was induced   in the operating room without complication.  The  patient did receive IV  Ancef prior to the commencement of the procedure.  The patient was   placed in the beach-chair position with the back raised about 30   degrees.  The nonoperative extremity and head and neck were carefully   positioned and padded protecting against neurovascular compromise.  The   left upper extremity was then prepped and draped in the standard sterile   fashion.    The appropriate operative time-out was performed with   Anesthesia, the perioperative staff, as well as myself and we all agreed   that the left side was the correct operative site.  An approximately   10 cm incision was made from the tip of the coracoid to the center point of the   humerus at the level of the axilla.  Dissection was carried down sharply   through subcutaneous tissues and cephalic vein was identified and taken   laterally with the deltoid.  The pectoralis major was taken medially.  The   upper 1 cm of the pectoralis major was released from its attachment on   the humerus.  The clavipectoral fascia was incised just lateral to the   conjoined tendon.  This incision was carried up to but not into the   coracoacromial ligament.  Digital palpation was used to prove   integrity of the axillary nerve which was protected throughout the   procedure.  Musculocutaneous nerve was not palpated in the operative   field.  Conjoined tendon was then retracted gently medially and the   deltoid laterally.  Anterior circumflex humeral vessels were clamped and   coagulated.  The soft tissues overlying the biceps was incised and this   incision was carried across the transverse humeral ligament to the base   of the coracoid.  The biceps was tenodesed to the soft tissue just above   pectoralis major and the remaining portion of the biceps superiorly was   excised.  An osteotomy was performed at the lesser tuberosity.  Capsule was then   released all the way down to the 6 o'clock position of the humeral head.    The humeral head was then delivered with simultaneous adduction,   extension and external rotation.  the humerus was marked and cut free hand at   approximately 25 degrees retroversion within about 3 mm of the cuff   reflection posteriorly.  The head size was estimated to be a 43 low  offset.  At that point, the humeral head was retracted posteriorly with   a Fukuda retractor. The remaining portion of the proximal long head biceps was resected. The risks of the labrum was carefully examined. There was one small suture anterior-inferior from previous repair which was removed. The rest of the labrum really like intact and I felt it was best to leave intact given her history of subtle instability patterns.  The proximal humerus was then again exposed.    The entry awl was used followed by sounding reamers and then sequentially broached from size 1 to 3.  Trial head was placed with a 43.  With the trial implantation of the component,  there was approximately 50% posterior translation with immediate snap back to the   anatomic position. The trial was removed and the final implant was prepared on a back table.  The trial was removed and the final implant was prepared on a back table.   3 small holes were drilled on the medial side of the lesser tuberosity osteotomy, through which 2 labral tapes were passed. The implant was then placed through the loop of the 2 labral tapes and impacted with an excellent press-fit. This achieved excellent anatomic reconstruction of the proximal humerus.  The joint was then copiously irrigated with pulse lavage.  The subscapularis and   lesser tuberosity osteotomy were then repaired using the 2 labral tapes previously passed in a double row fashion with horizontal mattress sutures medially brought over through bone tunnels tied over a bone bridge laterally.   One #1 Ethibond was placed at the rotator interval just above   the lesser tuberosity. Copious irrigation was used.  Skin was closed with 2-0 Vicryl sutures in the deep dermal layer and 4-0 Monocryl in a subcuticular  running fashion.  Sterile dressings were then applied including Aquacel.  The patient was placed in a sling and allowed to awaken from general anesthesia and taken to the recovery room in stable condition.      POSTOPERATIVE PLAN:  Early passive range of motion will be allowed with the goal of 0 degrees external rotation and 90 degrees forward elevation.  No internal rotation at this time.  No active motion of the arm until the lesser tuberosity heals.  The patient will likely be kept in the hospital for 1-2 days and then discharged home.

## 2015-12-05 ENCOUNTER — Ambulatory Visit: Payer: Self-pay | Admitting: Emergency Medicine

## 2015-12-05 LAB — BASIC METABOLIC PANEL
Anion gap: 11 (ref 5–15)
BUN: 12 mg/dL (ref 6–20)
CHLORIDE: 108 mmol/L (ref 101–111)
CO2: 19 mmol/L — ABNORMAL LOW (ref 22–32)
CREATININE: 0.97 mg/dL (ref 0.44–1.00)
Calcium: 8.2 mg/dL — ABNORMAL LOW (ref 8.9–10.3)
GFR calc Af Amer: >60 mL/min (ref >60)
GFR calc non Af Amer: >60 mL/min (ref >60)
GLUCOSE: 112 mg/dL — AB (ref 65–99)
Potassium: 3.8 mmol/L (ref 3.5–5.1)
SODIUM: 138 mmol/L (ref 135–145)

## 2015-12-05 LAB — CBC
HCT: 33.5 % — ABNORMAL LOW (ref 36.0–46.0)
HEMOGLOBIN: 11.4 g/dL — AB (ref 12.0–15.0)
MCH: 28.9 pg (ref 26.0–34.0)
MCHC: 34 g/dL (ref 30.0–36.0)
MCV: 85 fL (ref 78.0–100.0)
Platelets: 247 10*3/uL (ref 150–400)
RBC: 3.94 MIL/uL (ref 3.87–5.11)
RDW: 13.6 % (ref 11.5–15.5)
WBC: 12.8 10*3/uL — ABNORMAL HIGH (ref 4.0–10.5)

## 2015-12-05 MED ORDER — HYDROMORPHONE HCL 2 MG PO TABS: 2.0000 mg | ORAL_TABLET | ORAL | 0 refills | Status: DC | PRN

## 2015-12-05 NOTE — Progress Notes (Signed)
Occupational Therapy Treatment Patient Details Name: Kari Green MRN: [id number] DOB: 1965/05/12 Today's Date: 12/05/2015    History of present illness Pt is 50 y/o female post-op left shoulder hemi arthroplasty. Pt has extensive PMH, please see H&P in notes.   OT comments  Pt at same level of progression towards goals as this mornings session. Pt still suspect for memory retention of shoulder education due to medications. OT reviewed entire shoulder d/c handout (personalized for Pt) with husband including demonstrations. OT clarified all questions/concerns with Pt and family before discharge.  Follow Up Recommendations  Supervision/Assistance - 24 hour    Equipment Recommendations  None recommended by OT    Recommendations for Other Services      Precautions / Restrictions Precautions Precautions: Shoulder Type of Shoulder Precautions: Passive Protocol - FF 0-90; ER to neutral Shoulder Interventions: Shoulder sling/immobilizer;Off for dressing/bathing/exercises;At all times Precaution Booklet Issued: Yes (comment) Precaution Comments: D/c shoulder handout reviewed thoroughly (with husband) Required Braces or Orthoses: Sling Restrictions Weight Bearing Restrictions: Yes LUE Weight Bearing: Non weight bearing       Mobility Bed Mobility Overal bed mobility: Needs Assistance Bed Mobility: Supine to Sit     Supine to sit: Supervision;HOB elevated     General bed mobility comments: increased time, supervision for safety and weight bearing precautions  Transfers Overall transfer level: Needs assistance   Transfers: Stand Pivot Transfers   Stand pivot transfers: Min assist       General transfer comment: Pt had a hard time keeping eyes open, and needed physical guiding assist from RN to get to wheelchair    Balance Overall balance assessment: Needs assistance Sitting-balance support: Single extremity supported;Feet supported Sitting balance-Leahy Scale:  Poor Sitting balance - Comments: leaning to right for pain management LLE, and swayed while sitting Postural control:  (sway) Standing balance support: Single extremity supported Standing balance-Leahy Scale: Poor                     ADL                                         General ADL Comments: Entire handout (including demonstration of exercises) reviewed with husband in entirety and answered all questions and concerns about ADL and exercises. Also shared with husband that if ROM for FF does not improve in about 3 days that he should follow up with Dr. Tamera Punt to see how he should proceed.      Vision                     Perception     Praxis      Cognition   Behavior During Therapy: Flat affect Overall Cognitive Status: Difficult to assess       Memory: Decreased short-term memory               Extremity/Trunk Assessment               Exercises Shoulder Exercises Shoulder Flexion: PROM;AROM;Left;10 reps;Supine Shoulder External Rotation: PROM;AROM;Left;10 reps;Supine;Seated;Standing (to neutral) Elbow Flexion: AROM;Left;10 reps;Standing;Seated Elbow Extension: AROM;Left;10 reps;Seated;Standing Wrist Flexion: AROM;Left Wrist Extension: AROM;Left Digit Composite Flexion: AROM;Left Composite Extension: AROM;Left Neck Flexion: AROM Neck Extension: AROM Neck Lateral Flexion - Right: AROM Neck Lateral Flexion - Left: AROM Donning/doffing shirt without moving shoulder: Maximal assistance;Caregiver independent with task Method for sponge bathing under operated UE: Maximal  assistance;Caregiver independent with task Donning/doffing sling/immobilizer: Maximal assistance;Caregiver independent with task Correct positioning of sling/immobilizer: Maximal assistance;Caregiver independent with task ROM for elbow, wrist and digits of operated UE: Modified independent Sling wearing schedule (on at all times/off for ADL's): Modified  independent Proper positioning of operated UE when showering: Maximal assistance;Caregiver independent with task Positioning of UE while sleeping: Caregiver independent with task;Maximal assistance   Shoulder Instructions Shoulder Instructions Donning/doffing shirt without moving shoulder: Maximal assistance;Caregiver independent with task Method for sponge bathing under operated UE: Maximal assistance;Caregiver independent with task Donning/doffing sling/immobilizer: Maximal assistance;Caregiver independent with task Correct positioning of sling/immobilizer: Maximal assistance;Caregiver independent with task ROM for elbow, wrist and digits of operated UE: Modified independent Sling wearing schedule (on at all times/off for ADL's): Modified independent Proper positioning of operated UE when showering: Maximal assistance;Caregiver independent with task Positioning of UE while sleeping: Caregiver independent with task;Maximal assistance     General Comments      Pertinent Vitals/ Pain       Pain Assessment: 0-10 Pain Score: 8  Pain Location: L shoulder and Left leg Pain Descriptors / Indicators: Sore;Shooting Pain Intervention(s): Monitored during session;Premedicated before session  Home Living                                          Prior Functioning/Environment              Frequency           Progress Toward Goals  OT Goals(current goals can now be found in the care plan section)  Progress towards OT goals: Progressing toward goals  Acute Rehab OT Goals Patient Stated Goal: to get home OT Goal Formulation: With patient Time For Goal Achievement: 12/12/15 Potential to Achieve Goals: Good ADL Goals Pt Will Perform Upper Body Dressing: with min assist;sitting Pt Will Perform Tub/Shower Transfer: with min assist;with caregiver independent in assisting;Tub transfer;ambulating Additional ADL Goal #1: Pt will be able to demonstrate exercises with  less than 1 verbal cue Additional ADL Goal #2: Pt will be able to demonstrate safe technique for washing under arm in shower.  Plan Discharge plan remains appropriate    Co-evaluation                 End of Session Equipment Utilized During Treatment: Other (comment) (sling)   Activity Tolerance Patient tolerated treatment well   Patient Left Other (comment) (discharging with husband and RN)   Nurse Communication          [id number] OT Time Calculation (min): 14 min  Charges: OT General Charges $OT Visit: 1 Procedure OT Treatments $Self Care/Home Management : 8-22 mins  Merri Ray Lowen Barringer 12/05/2015, 2:07 PM  Hulda Humphrey OTR/L [phone]

## 2015-12-05 NOTE — Progress Notes (Signed)
   PATIENT ID: Kari Green   1 Day Post-Op Procedure(s) (LRB): SHOULDER HEMI-ARTHROPLASTY (Left)  Subjective: Reports she is sore but doing okay, block has worn off.   Objective:  Vitals:   12/05/15 0012 12/05/15 0500  BP: (!) 94/57 (!) 90/57  Pulse: 86 88  Resp: 16 16  Temp: 98.6 F (37 C) 100.1 F (37.8 C)     R UE dressing with scant blood  Mod hand swelling  Wiggles fingers, distally NVI  Labs:   Recent Labs  12/04/15 0619 12/05/15 0641  HGB 12.3 11.4*   Recent Labs  12/04/15 0619 12/05/15 0641  WBC 9.0 12.8*  RBC 4.31 3.94  HCT 36.6 33.5*  PLT 296 247   Recent Labs  12/04/15 0619 12/05/15 0641  NA 138 138  K 3.7 3.8  CL 106 108  CO2 25 19*  BUN 13 12  CREATININE 0.87 0.97  GLUCOSE 92 112*  CALCIUM 8.9 8.2*    Assessment and Plan: 1 day s/p left shoulder hemiarthroplasty D/c dilaudid IV, continue dilaudid po for pain control D/c home today once cleared by PT Fu with Dr. Tamera Punt in 2 weeks  VTE proph:  ASA 325mg  BID, SCDs

## 2015-12-05 NOTE — Evaluation (Signed)
Occupational Therapy Evaluation Patient Details Name: Kari Green MRN: [id number] DOB: 06-02-1965 Today's Date: 12/05/2015    History of Present Illness Pt is 50 y/o female post-op left shoulder hemi arthroplasty   Clinical Impression   Pt with limited session due to lethargy and impaired cognition suspect to medications. Pt currently at max assist for ADL, and shoulder exercises. Pt able to maintain NWB status through bed mobility. Pt will benefit from skilled OT in the acute care setting, and as time allows OT would like to educate family to ensure safety with ADL and exercises.    Follow Up Recommendations  Supervision/Assistance - 24 hour    Equipment Recommendations  None recommended by OT    Recommendations for Other Services       Precautions / Restrictions Precautions Precautions: Shoulder Type of Shoulder Precautions: Passive Protocol - FF 0-90; ER to neutral Shoulder Interventions: Shoulder sling/immobilizer;Off for dressing/bathing/exercises;At all times Precaution Booklet Issued: Yes (comment) Precaution Comments: D/c shoulder handout reviewed thoroughly Required Braces or Orthoses: Sling Restrictions Weight Bearing Restrictions: Yes LUE Weight Bearing: Non weight bearing      Mobility Bed Mobility Overal bed mobility: Needs Assistance Bed Mobility: Supine to Sit     Supine to sit: Supervision;HOB elevated     General bed mobility comments: increased time, supervision for safety and weight bearing precautions  Transfers Overall transfer level: Needs assistance   Transfers: Sit to/from Stand Sit to Stand: Min guard         General transfer comment: Pt unsteady on feet with "sway"    Balance Overall balance assessment: Needs assistance Sitting-balance support: Single extremity supported;Feet supported Sitting balance-Leahy Scale: Poor Sitting balance - Comments: leaning to right for pain management LLE, and swayed while  sitting Postural control:  (sway) Standing balance support: Single extremity supported;During functional activity Standing balance-Leahy Scale: Poor                              ADL Overall ADL's : Needs assistance/impaired     Grooming: Set up;Sitting           Upper Body Dressing : Maximal assistance;Sitting Upper Body Dressing Details (indicate cue type and reason): to don/doff sling                 Functional mobility during ADLs: Min guard General ADL Comments: Pt difficult to arouse suspect to medications.     Vision     Perception     Praxis      Pertinent Vitals/Pain Pain Assessment: 0-10 Pain Score: 9  Pain Location: Left shoulder, and Left leg Pain Descriptors / Indicators: Sore;Stabbing;Burning;Operative site guarding;Grimacing Pain Intervention(s): Premedicated before session;Monitored during session;Ice applied     Hand Dominance Right   Extremity/Trunk Assessment Upper Extremity Assessment Upper Extremity Assessment: LUE deficits/detail LUE Deficits / Details: post-op L shoulder replacement LUE: Unable to fully assess due to immobilization   Lower Extremity Assessment Lower Extremity Assessment: Overall WFL for tasks assessed   Cervical / Trunk Assessment Cervical / Trunk Assessment: Other exceptions (previous cervical fusions and back surgeries)   Communication Communication Communication: No difficulties;Other (comment) (impacted by medication)   Cognition Arousal/Alertness: Lethargic;Suspect due to medications Behavior During Therapy: Flat affect Overall Cognitive Status: Difficult to assess       Memory: Decreased short-term memory             General Comments       Exercises Exercises: Shoulder  Shoulder Instructions Shoulder Instructions Donning/doffing shirt without moving shoulder: Maximal assistance Method for sponge bathing under operated UE: Maximal assistance Donning/doffing sling/immobilizer:  Maximal assistance Correct positioning of sling/immobilizer: Maximal assistance ROM for elbow, wrist and digits of operated UE: Maximal assistance Sling wearing schedule (on at all times/off for ADL's): Maximal assistance Proper positioning of operated UE when showering: Maximal assistance Positioning of UE while sleeping: Maximal assistance    Home Living Family/patient expects to be discharged to:: Private residence Living Arrangements: Spouse/significant other Available Help at Discharge: Available PRN/intermittently (daughter who is 8) Type of Home: House       Home Layout: One level     Bathroom Shower/Tub: Tub/shower unit Shower/tub characteristics: Architectural technologist: Standard     Home Equipment: Cane - single point;Walker - standard;Shower seat   Additional Comments: Pt unreliable due to medications. Follow up with husband.      Prior Functioning/Environment Level of Independence:  (Unsure, Pt not reliable source of information)                 OT Problem List: Decreased strength;Decreased range of motion;Decreased activity tolerance;Impaired balance (sitting and/or standing);Decreased safety awareness;Decreased knowledge of use of DME or AE;Decreased knowledge of precautions;Impaired UE functional use;Pain   OT Treatment/Interventions:      OT Goals(Current goals can be found in the care plan section) Acute Rehab OT Goals Patient Stated Goal: to get home OT Goal Formulation: With patient Time For Goal Achievement: 12/12/15 Potential to Achieve Goals: Good ADL Goals Pt Will Perform Upper Body Dressing: with min assist;sitting Pt Will Perform Tub/Shower Transfer: with min assist;with caregiver independent in assisting;Tub transfer;ambulating Additional ADL Goal #1: Pt will be able to demonstrate exercises with less than 1 verbal cue Additional ADL Goal #2: Pt will be able to demonstrate safe technique for washing under arm in shower.  OT Frequency:      Barriers to D/C:            Co-evaluation              End of Session Equipment Utilized During Treatment: Gait belt;Other (comment) (sling) Nurse Communication: Mobility status;Other (comment) (please page OT when husband arrives at room)  Activity Tolerance: Patient limited by lethargy;Other (comment) (suspect due to medications) Patient left: in bed;with call bell/phone within reach;with bed alarm set;with nursing/sitter in room   Time: [id number] OT Time Calculation (min): 48 min Charges:  OT General Charges $OT Visit: 1 Procedure OT Evaluation $OT Eval Moderate Complexity: 1 Procedure OT Treatments $Self Care/Home Management : 8-22 mins $Therapeutic Exercise: 8-22 mins G-Codes:    Merri Ray Hollis Oh 2015/12/24, 11:46 AM  Hulda Humphrey OTR/L [phone]

## 2015-12-09 ENCOUNTER — Encounter (HOSPITAL_COMMUNITY): Payer: Self-pay | Admitting: Orthopedic Surgery

## 2015-12-18 ENCOUNTER — Encounter (HOSPITAL_BASED_OUTPATIENT_CLINIC_OR_DEPARTMENT_OTHER): Payer: Self-pay | Admitting: *Deleted

## 2015-12-18 ENCOUNTER — Emergency Department (HOSPITAL_BASED_OUTPATIENT_CLINIC_OR_DEPARTMENT_OTHER): Payer: BLUE CROSS/BLUE SHIELD

## 2015-12-18 ENCOUNTER — Emergency Department (HOSPITAL_BASED_OUTPATIENT_CLINIC_OR_DEPARTMENT_OTHER)
Admission: EM | Admit: 2015-12-18 | Discharge: 2015-12-18 | Disposition: A | Discharge status: Home / Self Care | Payer: BLUE CROSS/BLUE SHIELD | Attending: Emergency Medicine | Admitting: Emergency Medicine

## 2015-12-18 DIAGNOSIS — M7989 Other specified soft tissue disorders: Secondary | ICD-10-CM | POA: Diagnosis not present

## 2015-12-18 DIAGNOSIS — Z91040 Latex allergy status: Secondary | ICD-10-CM | POA: Diagnosis not present

## 2015-12-18 DIAGNOSIS — J45909 Unspecified asthma, uncomplicated: Secondary | ICD-10-CM | POA: Insufficient documentation

## 2015-12-18 DIAGNOSIS — R609 Edema, unspecified: Secondary | ICD-10-CM | POA: Diagnosis not present

## 2015-12-18 DIAGNOSIS — Z79891 Long term (current) use of opiate analgesic: Secondary | ICD-10-CM | POA: Diagnosis not present

## 2015-12-18 DIAGNOSIS — Z91010 Allergy to peanuts: Secondary | ICD-10-CM | POA: Diagnosis not present

## 2015-12-18 DIAGNOSIS — I10 Essential (primary) hypertension: Secondary | ICD-10-CM | POA: Diagnosis not present

## 2015-12-18 DIAGNOSIS — Z85828 Personal history of other malignant neoplasm of skin: Secondary | ICD-10-CM | POA: Diagnosis not present

## 2015-12-18 DIAGNOSIS — E039 Hypothyroidism, unspecified: Secondary | ICD-10-CM | POA: Insufficient documentation

## 2015-12-18 DIAGNOSIS — F17200 Nicotine dependence, unspecified, uncomplicated: Secondary | ICD-10-CM | POA: Insufficient documentation

## 2015-12-18 LAB — BASIC METABOLIC PANEL
Anion gap: 6 (ref 5–15)
BUN: 15 mg/dL (ref 6–20)
CHLORIDE: 111 mmol/L (ref 101–111)
CO2: 25 mmol/L (ref 22–32)
CREATININE: 0.81 mg/dL (ref 0.44–1.00)
Calcium: 8.8 mg/dL — ABNORMAL LOW (ref 8.9–10.3)
GFR calc Af Amer: >60 mL/min (ref >60)
GFR calc non Af Amer: >60 mL/min (ref >60)
Glucose, Bld: 88 mg/dL (ref 65–99)
Potassium: 3.5 mmol/L (ref 3.5–5.1)
SODIUM: 142 mmol/L (ref 135–145)

## 2015-12-18 LAB — CBC
HCT: 34.5 % — ABNORMAL LOW (ref 36.0–46.0)
HEMOGLOBIN: 11.4 g/dL — AB (ref 12.0–15.0)
MCH: 28.9 pg (ref 26.0–34.0)
MCHC: 33 g/dL (ref 30.0–36.0)
MCV: 87.6 fL (ref 78.0–100.0)
Platelets: 395 10*3/uL (ref 150–400)
RBC: 3.94 MIL/uL (ref 3.87–5.11)
RDW: 14.4 % (ref 11.5–15.5)
WBC: 6 10*3/uL (ref 4.0–10.5)

## 2015-12-18 MED ORDER — FUROSEMIDE 20 MG PO TABS: 20.0000 mg | ORAL_TABLET | Freq: Every day | ORAL | 0 refills | Status: DC

## 2015-12-18 NOTE — Anesthesia Procedure Notes (Signed)
Anesthesia Procedure Note     

## 2015-12-18 NOTE — Discharge Instructions (Signed)
Follow-up with your primary care doctor, take diuretics as prescribed. I would try to take in the morning because it will cause increased urination and may keep you up at night if you Take in the evening.

## 2015-12-18 NOTE — Anesthesia Procedure Notes (Signed)
Anesthesia Regional Block:  Interscalene brachial plexus block  Pre-Anesthetic Checklist: ,, timeout performed, Correct Patient, Correct Site, Correct Laterality, Correct Procedure, Correct Position, site marked, Risks and benefits discussed,  Surgical consent,  Pre-op evaluation,  At surgeon's request and post-op pain management  Laterality: Left  Prep: Betadine       Needles:  Injection technique: Single-shot  Needle Type: Stimulator Needle - 40          Additional Needles:  Procedures: Doppler guided, ultrasound guided (picture in chart) and nerve stimulator Interscalene brachial plexus block Narrative:  Injection made incrementally with aspirations every 5 mL. Anesthesiologist: ROSE, Iona Beard

## 2015-12-18 NOTE — Anesthesia Procedure Notes (Signed)
Anesthesia Regional Block: Narrative:       

## 2015-12-18 NOTE — Addendum Note (Signed)
Addendum  created 12/18/15 2252 by Finis Bud, MD   Anesthesia Intra Blocks edited, Child order released for a procedure order, Pend clinical note, Sign clinical note

## 2015-12-18 NOTE — ED Provider Notes (Signed)
[facility] Provider Note   CSN: [id number] Arrival date & time: 12/18/15  1458     History   Chief Complaint Chief Complaint  Patient presents with  . Leg Swelling    HPI Kari Green is a 50 y.o. female.  HPI The patient presents to the emergency room for evaluation of painful lower leg swelling. Patient has a history of recent shoulder surgery on October 12. She had a total left shoulder arthroplasty. Patient states over the last couple of days she's had swelling in her bilateral lower extremities.  Patient states her lower legs are painful to touch. She feels that her shoes are not fitting properly. She also felt that her hands were swollen previously. The swelling was worse earlier and has decreased somewhat. She has been trying to elevate her legs but had has not been completely effective. She denies any trouble with any chest pain or shortness of breath. Initially the patient thought it could be related to a patch that she had on her left shoulder because she has allergies related to adhesives. Patient called her orthopedic doctor who did not think that should cause any of the leg swelling. She called her primary doctor and was told to come to the emergency room reportedly to get a shot of medication to bring the fluid down. Patient does not have any history of PE or DVT. She denies any fevers or chills. Past Medical History:  Diagnosis Date  . Anxiety   . Arthritis    right thumb  . Asthma   . Cancer [facility])    history of skin cancer  . Chronic, continuous use of opioids   . Cognitive dysfunction    patient denies  . Complication of anesthesia    "not asleep and aware of everything"  . Degenerative disc disease, cervical   . Degenerative disc disease, lumbar   . Delayed gastric emptying   . Dental crowns present   . Difficult intravenous access   . Difficulty swallowing pills   . Esophageal dysmotility   . Family history of adverse reaction to  anesthesia    family - itching, hives, PONV  . Fibromyalgia   . Hemangioma of liver   . Hemangioma, renal   . History of bronchitis   . History of hiatal hernia   . History of kidney stones 04/2014  . History of pneumonia   . Hypertension   . Hypothyroidism (acquired)   . IC (interstitial cystitis)   . Irritable bowel syndrome (IBS)   . Kidney stone   . Knee pain, right    states gets "nerves burned" periodically  . Limited joint range of motion    right knee - unable to fully extend right leg  . Memory impairment    due to pain   . Migraines   . Osteoporosis   . Pain management   . Pain syndrome, chronic   . Pneumonia 2016  . PONV (postoperative nausea and vomiting)    uncontrollable itching with anesthesia; hx. of anesthesia awareness; pt needs 50  mg of Benadryl  . PONV (postoperative nausea and vomiting)    "need medication before and need medication afterwards"  . Sciatica    bilateral  . Seasonal allergies     Patient Active Problem List   Diagnosis Date Noted  . S/P shoulder hemiarthroplasty, left 12/04/2015  . Hemoptysis 10/16/2015  . Cough 10/16/2015  . Radiculopathy 07/16/2015  . Muscle cramps at night 05/06/2015  . Acute Pain  following procedure (Lumbar Sympathetic Radiofrequency Ablation) 05/06/2015  . Temporomandibular joint disorder 04/10/2015  . Buzzing in ear 04/10/2015  . Ear pain, referred 04/10/2015  . OP (osteoporosis) 04/10/2015  . 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/10/2015  . History of migraine headaches 04/10/2015  . Difficulty hearing 04/10/2015  . H/O respiratory system disease 04/10/2015  . Fibromyalgia 04/10/2015  . Deflected nasal septum 04/10/2015  . Chronic otitis externa 04/10/2015  . Abnormal auditory perception 04/10/2015  . History of allergy to IVP dye 01/31/2015  . Chronic low back pain 01/13/2015  . CRPS (complex regional pain syndrome) type I of lower limb (Right-sided) 01/13/2015  . History of arthroplasty of right knee  01/13/2015  . Chronic pain syndrome 01/13/2015  . Chronic pain 01/13/2015  . Chronic knee pain (Location of Secondary source of pain) (Right) 01/13/2015  . Generalized anxiety disorder 01/13/2015  . Chronic lower extremity pain (Location of Primary Source of Pain) (Right) 01/13/2015  . Multiple drug allergies (codeine, Lidoderm a DC if, sulfa, latex, IVP dye, Keflex, Augmentin) 01/13/2015  . Latex allergy 01/13/2015  . Long term current use of opiate analgesic 01/13/2015  . Long term prescription opiate use 01/13/2015  . Opiate use 01/13/2015  . Opiate dependence ([place]) 01/13/2015  . Asthma 11/15/2014  . Allergic rhinitis 11/15/2014  . Memory deficits 10/24/2013  . Left shoulder pain 03/16/2013  . Hypothyroidism 09/21/2012  . Abdominal adhesions 02/11/2012  . Abdominal pain, epigastric 12/15/2011  . Irritable bowel syndrome with constipation 02/26/2011    Past Surgical History:  Procedure Laterality Date  . ABDOMINAL HYSTERECTOMY    . ANTERIOR CERVICAL DECOMP/DISCECTOMY FUSION  03/10/2009   C5-6, C6-7  . ANTERIOR CERVICAL DECOMP/DISCECTOMY FUSION N/A 07/16/2015   Procedure: ANTERIOR CERVICAL DECOMPRESSION FUSION 4-5 WITH INSTRUMENTATION AND AUTOGRAFT;  Surgeon: Phylliss Bob, MD;  Location: [facility];  Service: Orthopedics;  Laterality: N/A;  ANTERIOR CERVICAL DECOMPRESSION FUSION 4-5 WITH INSTRUMENTATION AND ALLOGRAFT  . APPENDECTOMY  ~ 1992  . AUGMENTATION MAMMAPLASTY Bilateral 1996; 2001, 2016  . BREAST SURGERY  1980's   accessory breast tissue exc.  Marland Kitchen CARPAL TUNNEL RELEASE Right 07/26/2001  . CARPAL TUNNEL RELEASE Left   . CARPOMETACARPEL SUSPENSION PLASTY Right 02/10/2015   Procedure: RIGHT THUMB SUSPENSION  ARTHROPLASTY;  Surgeon: Milly Jakob, MD;  Location: [facility];  Service: Orthopedics;  Laterality: Right;  ANESTHESIA: PRE- OP BLOCK  . COLONOSCOPY W/ POLYPECTOMY    . CYSTO WITH HYDRODISTENSION  01/29/2011   Procedure: CYSTOSCOPY/HYDRODISTENSION;  Surgeon:  Claybon Jabs, MD;  Location: [facility];  Service: Urology;  Laterality: N/A;  . CYSTO WITH HYDRODISTENSION N/A 05/08/2014   Procedure: CYSTOSCOPY/HYDRODISTENSION WITH BILATERAL RETROGRADES;  Surgeon: Ardis Hughs, MD;  Location: [facility];  Service: Urology;  Laterality: N/A;  . CYSTO WITH HYDRODISTENSION N/A 03/27/2015   Procedure: CYSTOSCOPY/HYDRODISTENSION OF BLADDER, INSTILLATION OF MARCAINE/PYRIDIUM;  Surgeon: Ardis Hughs, MD;  Location: [facility];  Service: Urology;  Laterality: N/A;  . CYSTO/  BLADDER BX  03/08/2008  . CYSTO/ URETHRAL DILATION/ HYDRODISTENTION/ BLADDER BX  04/10/2003  . CYSTOSCOPY W/ RETROGRADES  01/29/2011   Procedure: CYSTOSCOPY WITH RETROGRADE PYELOGRAM;  Surgeon: Claybon Jabs, MD;  Location: [facility];  Service: Urology;  Laterality: Bilateral;  . CYSTOSCOPY WITH STENT PLACEMENT  2006   URETERAL  . DEBRIDEMENT RIGHT THUMB MP JOINT Right 04/10/2002  . DECOMPRESSION LEFT ULNAR NERVE, ELBOW Left 01/29/2003  . ESOPHAGOGASTRODUODENOSCOPY (EGD) WITH ESOPHAGEAL DILATION  10-26-2000  . EXCISION HAGLUND'S  DEFORMITY WITH ACHILLES TENDON REPAIR Bilateral 2013-2014  . EYE SURGERY Bilateral    x4  . FISSURECTOMY  ~ 1988 x2  . GANGLION CYST EXCISION Right 02/10/2015   Procedure: REMOVAL GANGLION OF WRIST, PARTIAL ULNAR EXCISION;  Surgeon: Milly Jakob, MD;  Location: [place];  Service: Orthopedics;  Laterality: Right;  . HAND SURGERY Right 01-31-15  . HEMORROIDECTOMY    . KIDNEY STONE SURGERY    . KNEE ARTHROSCOPY Right 08/16/2000  . KNEE ARTHROSCOPY W/ LATERAL RELEASE Right 11/29/2000   states 13 surgeries on right knee  . KNEE ARTHROSCOPY WITH FULKERSON SLIDE Right 11/29/2000  . LAPAROSCOPIC ASSISTED VAGINAL HYSTERECTOMY  Feb 1999  . LAPAROSCOPIC CHOLECYSTECTOMY  03/04/2002  . LAPAROSCOPIC LYSIS OF ADHESIONS  02/11/2012   Procedure: LAPAROSCOPIC LYSIS OF ADHESIONS;  Surgeon: Cheri Fowler, MD;  Location: [facility];  Service: Gynecology;;  . LAPAROSCOPY  02/11/2012   Procedure: LAPAROSCOPY DIAGNOSTIC;  Surgeon: Cheri Fowler, MD;  Location: [facility];  Service: Gynecology;  Laterality: N/A;  Diagnostic  Operative Laparoscopic   . LAPAROSCOPY ADHESIOLYSIS AND REMOVAL POSSIBLE RIGHT OVARY REMNANT  04/18/2000  . LAPAROSCOPY LYSIS ADHESIONS/  BILATERAL SALPINGOOPHORECTOMY/  Ione OF ENDOMETRIOSIS  1998  . MELANOMA EXCISION Left 2013   "behind knee"   . MELANOMA EXCISION     scalp  . NASAL SINUS SURGERY  2013  . NEGATIVE SLEEP STUDY  2006 approx .  per pt  . REFRACTIVE SURGERY Bilateral ~ 1999; ~ 2013  . RIGHT THUMB FUSION OF MPJ Right 10/03/2002  . RIGHT TOTAL KNEE REVISION ARTHROPLASTY Right 03/14/2007  . SHOULDER HEMI-ARTHROPLASTY Left 12/04/2015   Procedure: SHOULDER HEMI-ARTHROPLASTY;  Surgeon: Tania Ade, MD;  Location: [facility];  Service: Orthopedics;  Laterality: Left;  Left shoulder hemiarthroplasty  . SHOULDER SURGERY Left   . TONSILLECTOMY AND ADENOIDECTOMY  ~ 1989  . TOTAL KNEE ARTHROPLASTY Right 09/10/2003; 12/08/2012   x3  . TOTAL THYROIDECTOMY  1993  . TRANSANAL HEMORRHOIDAL DEARTERIALIZATION  03/26/2011  . TRANSTHORACIC ECHOCARDIOGRAM  09/22/2012  . VIDEO BRONCHOSCOPY Bilateral 10/20/2015   Procedure: VIDEO BRONCHOSCOPY WITHOUT FLUORO;  Surgeon: Collene Gobble, MD;  Location: [facility];  Service: Cardiopulmonary;  Laterality: Bilateral;    OB History    Gravida Para Term Preterm AB Living   0 0 0 0 0     SAB TAB Ectopic Multiple Live Births   0 0 0           Home Medications    Prior to Admission medications   Medication Sig Start Date End Date Taking? Authorizing Provider  albuterol (PROAIR HFA) 108 (90 Base) MCG/ACT inhaler Inhale 2 puffs into the lungs every 4 (four) hours as needed for wheezing or shortness of breath. 08/06/15   Adelina Mings, MD  albuterol (PROVENTIL) (2.5 MG/3ML) 0.083% nebulizer solution Take 3 mLs (2.5 mg total)  by nebulization every 4 (four) hours as needed for wheezing (or coughing spells). 08/06/15   Adelina Mings, MD  ALPRAZolam Duanne Moron) 0.5 MG tablet Take 1 mg by mouth 3 (three) times daily as needed. Anxiety    Historical Provider, MD  cyanocobalamin (,VITAMIN B-12,) 1000 MCG/ML injection Inject 1,000 mcg into the muscle once a week. Reported on 03/25/2015    Historical Provider, MD  diphenhydrAMINE (BENADRYL) 50 MG tablet Take 50 mg by mouth every 4 (four) hours as needed for itching (prn , alternates with hydroxyzine).     Historical Provider, MD  EPINEPHrine (EPIPEN 2-PAK) 0.3 mg/0.3 mL IJ SOAJ  injection Inject 0.3 mLs (0.3 mg total) into the muscle as needed (in the event of a severe life-threatening reaction). 12/09/14   Charlies Silvers, MD  estradiol (ESTRACE) 0.5 MG tablet Take 1 mg by mouth at bedtime.     Historical Provider, MD  EVZIO 0.4 MG/0.4ML SOAJ AUTO INJECT AS NEEDED IN CASE OF EMERGENCY WHERE GRANDCHILDREN MAY GET A HOLD OF PATIENTS PAIN MEDICATIONS. 11/14/14   Historical Provider, MD  fluticasone (FLONASE) 50 MCG/ACT nasal spray PLACE 2 SPRAYS INTO BOTH NOSTRILS DAILY. 06/09/15   Charlies Silvers, MD  furosemide (LASIX) 20 MG tablet Take 1 tablet (20 mg total) by mouth daily. 12/18/15   Dorie Rank, MD  HYDROmorphone (DILAUDID) 2 MG tablet Take 1-3 tablets (2-6 mg total) by mouth every 4 (four) hours as needed for moderate pain or severe pain. 12/05/15   Grier Mitts, PA-C  hydrOXYzine (ATARAX/VISTARIL) 25 MG tablet Take 50 mg by mouth every 4 (four) hours as needed for itching. Alternates with benadryl    Historical Provider, MD  ketoconazole (NIZORAL) 2 % shampoo APPLICATIONS APPLY ON THE SKIN [employer] ONCE WEEKLY. 11/19/14   Historical Provider, MD  levothyroxine (SYNTHROID, LEVOTHROID) 50 MCG tablet Take 200 mcg by mouth daily. Pt can only take name brand    Historical Provider, MD  MethylPREDNISolone Acetate (DEPO-MEDROL IJ) Inject 1 each as directed daily as needed (allergic  reaction.).    Historical Provider, MD  mometasone-formoterol (DULERA) 200-5 MCG/ACT AERO Inhale 2 puffs into the lungs 2 (two) times daily. 08/19/15   Adelina Mings, MD  Multiple Vitamin (MULTIVITAMIN WITH MINERALS) TABS tablet Take 1 tablet by mouth at bedtime.    Historical Provider, MD  OnabotulinumtoxinA (BOTOX IJ) Inject as directed every 3 (three) months. For migraines    Historical Provider, MD  promethazine (PHENERGAN) 25 MG tablet Take 25-50 mg by mouth 2 (two) times daily as needed for nausea or vomiting.     Historical Provider, MD  propranolol (INDERAL) 20 MG tablet Take 20 mg by mouth 2 (two) times daily. 05/12/15   Historical Provider, MD  SUMAtriptan (IMITREX) 100 MG tablet Take 1 tablet at earliest onset of headache.  May repeat once in 2 hours if headache persists or recurs.  Do not exceed 2 tabs in 24 hours 09/08/15   Pieter Partridge, DO  SUMAtriptan 6 MG/0.5ML SOAJ Inject 6 mg into the skin as directed. Patient taking differently: Inject 6 mg into the skin daily as needed (for migraine headache.).  09/12/15   Pieter Partridge, DO  topiramate (TOPAMAX) 25 MG tablet Take 75 mg by mouth 2 (two) times daily. 04/15/15   Historical Provider, MD  UNABLE TO FIND once a week. Allergy injections - once  a week 2 shots    Historical Provider, MD  Zoledronic Acid (RECLAST IV) Inject into the vein as directed. Yearly --  Last dose Oct 2015    Historical Provider, MD    Family History Family History  Problem Relation Age of Onset  . Anesthesia problems Mother   . Cancer Mother     ovarian  . Leukemia Mother   . Cancer Daughter     cervical  . Cancer Maternal Aunt     breast    Social History Social History  Substance Use Topics  . Smoking status: Current Some Day Smoker    Last attempt to quit: 02/22/2006  . Smokeless tobacco: Never Used  . Alcohol use 0.0 oz/week     Comment: rarely  Allergies   [place]; Latex; Nickel; Peanuts [peanut oil]; Red dye; Azithromycin; Chloraprep  one step [chlorhexidine gluconate]; Corticosteroids; Flexeril [cyclobenzaprine]; Morphine and related; Other; Percocet [oxycodone-acetaminophen]; Supartz [sodium hyaluronate]; Surgical lubricant; Yellow dyes (non-tartrazine); Prednisone; Soap; and Tape   Review of Systems Review of Systems  All other systems reviewed and are negative.    Physical Exam Updated Vital Signs BP 136/91   Pulse 97   Temp 97.9 F (36.6 C)   Resp 16   Ht 5\' 2"  (1.575 m)   Wt 62.6 kg   SpO2 99%   BMI 25.24 kg/m   Physical Exam  Constitutional: She appears well-developed and well-nourished. No distress.  HENT:  Head: Normocephalic and atraumatic.  Right Ear: External ear normal.  Left Ear: External ear normal.  Eyes: Conjunctivae are normal. Right eye exhibits no discharge. Left eye exhibits no discharge. No scleral icterus.  Neck: Neck supple. No tracheal deviation present.  Cardiovascular: Normal rate, regular rhythm and intact distal pulses.   Pulmonary/Chest: Effort normal and breath sounds normal. No stridor. No respiratory distress. She has no wheezes. She has no rales.  Abdominal: Soft. Bowel sounds are normal. She exhibits no distension. There is no tenderness. There is no rebound and no guarding.  Musculoskeletal: She exhibits edema and tenderness.  Left shoulder in a sling, no erythema, mild edema bilateral lower extremities below the knee involving feet and ankles, normal pulses bilaterally, no erythema, no induration  Neurological: She is alert. She has normal strength. No cranial nerve deficit (no facial droop, extraocular movements intact, no slurred speech) or sensory deficit. She exhibits normal muscle tone. She displays no seizure activity. Coordination normal.  Skin: Skin is warm and dry. No rash noted.  Psychiatric: She has a normal mood and affect.  Nursing note and vitals reviewed.    ED Treatments / Results  Labs (all labs ordered are listed, but only abnormal results are  displayed) Labs Reviewed  CBC - Abnormal; Notable for the following:       Result Value   Hemoglobin 11.4 (*)    HCT 34.5 (*)    All other components within normal limits  BASIC METABOLIC PANEL - Abnormal; Notable for the following:    Calcium 8.8 (*)    All other components within normal limits  US Venous Img Lower Bilateral  Result Date: 12/18/2015 CLINICAL DATA:  Bilateral lower extremity pain and swelling for the past 3 days. History of skin cancer, smoking and recent shoulder surgery. Evaluate for DVT. EXAM: BILATERAL LOWER EXTREMITY VENOUS DOPPLER ULTRASOUND TECHNIQUE: Gray-scale sonography with graded compression, as well as color Doppler and duplex ultrasound were performed to evaluate the lower extremity deep venous systems from the level of the common femoral vein and including the common femoral, femoral, profunda femoral, popliteal and calf veins including the posterior tibial, peroneal and gastrocnemius veins when visible. The superficial great saphenous vein was also interrogated. Spectral Doppler was utilized to evaluate flow at rest and with distal augmentation maneuvers in the common femoral, femoral and popliteal veins. COMPARISON:  None. FINDINGS: RIGHT LOWER EXTREMITY Common Femoral Vein: No evidence of thrombus. Normal compressibility, respiratory phasicity and response to augmentation. Saphenofemoral Junction: No evidence of thrombus. Normal compressibility and flow on color Doppler imaging. Profunda Femoral Vein: No evidence of thrombus. Normal compressibility and flow on color Doppler imaging. Femoral Vein: No evidence of thrombus. Normal compressibility, respiratory phasicity and response to augmentation. Popliteal Vein: No evidence of thrombus. Normal compressibility, respiratory phasicity and response to augmentation. Calf  Veins: No evidence of thrombus. Normal compressibility and flow on color Doppler imaging. Superficial Great Saphenous Vein: No evidence of thrombus. Normal  compressibility and flow on color Doppler imaging. Venous Reflux:  None. Other Findings:  None. LEFT LOWER EXTREMITY Common Femoral Vein: No evidence of thrombus. Normal compressibility, respiratory phasicity and response to augmentation. Saphenofemoral Junction: No evidence of thrombus. Normal compressibility and flow on color Doppler imaging. Profunda Femoral Vein: No evidence of thrombus. Normal compressibility and flow on color Doppler imaging. Femoral Vein: No evidence of thrombus. Normal compressibility, respiratory phasicity and response to augmentation. Popliteal Vein: No evidence of thrombus. Normal compressibility, respiratory phasicity and response to augmentation. Calf Veins: No evidence of thrombus. Normal compressibility and flow on color Doppler imaging. Superficial Great Saphenous Vein: No evidence of thrombus. Normal compressibility and flow on color Doppler imaging. Venous Reflux:  None. Other Findings:  None. IMPRESSION: No evidence of DVT within either lower extremity. Electronically Signed   By: Sandi Mariscal M.D.   On: 12/18/2015 16:55    Procedures Procedures (including critical care time)  Medications Ordered in ED Medications - No data to display   Initial Impression / Assessment and Plan / ED Course  I have reviewed the triage vital signs and the nursing notes.  Pertinent labs & imaging results that were available during my care of the patient were reviewed by me and considered in my medical decision making (see chart for details).  Clinical Course   The Doppler study does not show any evidence of DVT bilaterally. Patient's renal function laboratory tests are unremarkable. She does not have any evidence of systemic edema. She does have mild peripheral edema in her lower extremities. States she cannot use compression stockings because she is allergic. I explained her that different brands minute of different materials. Her doctor told her to come in here and get a diuretic  medication. I will give her a prescription for oral Lasix.  Final Clinical Impressions(s) / ED Diagnoses   Final diagnoses:  Leg swelling  Peripheral edema    New Prescriptions New Prescriptions   FUROSEMIDE (LASIX) 20 MG TABLET    Take 1 tablet (20 mg total) by mouth daily.     Dorie Rank, MD 12/18/15 [id number]

## 2015-12-18 NOTE — ED Triage Notes (Signed)
Pt c/o lower leg swelling x 3 days

## 2015-12-19 ENCOUNTER — Ambulatory Visit: Payer: Self-pay | Admitting: Neurology

## 2015-12-19 ENCOUNTER — Telehealth: Payer: Self-pay | Admitting: Neurology

## 2015-12-19 MED ORDER — SUMATRIPTAN SUCCINATE 6 MG/0.5ML ~~LOC~~ SOAJ: 6.0000 mg | Freq: Every day | SUBCUTANEOUS | 3 refills | Status: DC | PRN

## 2015-12-19 NOTE — Telephone Encounter (Signed)
Rx sent to pharmacy   

## 2015-12-19 NOTE — Telephone Encounter (Signed)
Oriya Glanzer 10/28/1965. She has had a total shoulder replacement. She was needing to know if Dr. Tomi Likens could call her in Sumatriptan injection 6 mg since she is unable to take the other medications right now due to her shoulder surgery. She was asking for 6 boxes. She was scheduled for a Botox appointment for today and we rescheduled for 01/30/16. Dr Tomi Likens did not have anything else before then.  Her # [id number]. Thank you

## 2015-12-22 DIAGNOSIS — M87012 Idiopathic aseptic necrosis of left shoulder: Secondary | ICD-10-CM | POA: Diagnosis not present

## 2015-12-22 NOTE — Discharge Summary (Signed)
Patient ID: Kari Green MRN: [id number] DOB/AGE: 1965-08-30 50 y.o.  Admit date: 12/04/2015 Discharge date: 12/05/15  Admission Diagnoses:  Active Problems:   S/P shoulder hemiarthroplasty, left   Discharge Diagnoses:  Same  Past Medical History:  Diagnosis Date  . Anxiety   . Arthritis    right thumb  . Asthma   . Cancer [facility])    history of skin cancer  . Chronic, continuous use of opioids   . Cognitive dysfunction    patient denies  . Complication of anesthesia    "not asleep and aware of everything"  . Degenerative disc disease, cervical   . Degenerative disc disease, lumbar   . Delayed gastric emptying   . Dental crowns present   . Difficult intravenous access   . Difficulty swallowing pills   . Esophageal dysmotility   . Family history of adverse reaction to anesthesia    family - itching, hives, PONV  . Fibromyalgia   . Hemangioma of liver   . Hemangioma, renal   . History of bronchitis   . History of hiatal hernia   . History of kidney stones 04/2014  . History of pneumonia   . Hypertension   . Hypothyroidism (acquired)   . IC (interstitial cystitis)   . Irritable bowel syndrome (IBS)   . Kidney stone   . Knee pain, right    states gets "nerves burned" periodically  . Limited joint range of motion    right knee - unable to fully extend right leg  . Memory impairment    due to pain   . Migraines   . Osteoporosis   . Pain management   . Pain syndrome, chronic   . Pneumonia 2016  . PONV (postoperative nausea and vomiting)    uncontrollable itching with anesthesia; hx. of anesthesia awareness; pt needs 50  mg of Benadryl  . PONV (postoperative nausea and vomiting)    "need medication before and need medication afterwards"  . Sciatica    bilateral  . Seasonal allergies     Surgeries: Procedure(s): SHOULDER HEMI-ARTHROPLASTY on 12/04/2015   Consultants:   Discharged Condition: Improved  Hospital Course: Kari Green is an 50 y.o.  female who was admitted 12/04/2015 for operative treatment of left glenohumeral avascular necrosis. Patient has severe unremitting pain that affects sleep, daily activities, and work/hobbies. After pre-op clearance the patient was taken to the operating room on 12/04/2015 and underwent  Procedure(s): SHOULDER HEMI-ARTHROPLASTY.    Patient was given perioperative antibiotics:  Anti-infectives    Start     Dose/Rate Route Frequency Ordered Stop   12/04/15 1530  ceFAZolin (ANCEF) IVPB 2g/100 mL premix     2 g 200 mL/hr over 30 Minutes Intravenous Every 6 hours 12/04/15 1334 12/05/15 0513   12/04/15 0523  ceFAZolin (ANCEF) IVPB 2g/100 mL premix     2 g 200 mL/hr over 30 Minutes Intravenous On call to O.R. 12/04/15 [id number] 12/04/15 [id number]       Patient was given sequential compression devices, early ambulation, and asa to prevent DVT.  Patient benefited maximally from hospital stay and there were no complications.    Recent vital signs: No data found.    Recent laboratory studies: No results for input(s): WBC, HGB, HCT, PLT, NA, K, CL, CO2, BUN, CREATININE, GLUCOSE, INR, CALCIUM in the last 72 hours.  Invalid input(s): PT, 2   Discharge Medications:     Medication List    TAKE these medications   albuterol (2.5 MG/3ML) 0.083%  nebulizer solution Commonly known as:  PROVENTIL Take 3 mLs (2.5 mg total) by nebulization every 4 (four) hours as needed for wheezing (or coughing spells).   albuterol 108 (90 Base) MCG/ACT inhaler Commonly known as:  PROAIR HFA Inhale 2 puffs into the lungs every 4 (four) hours as needed for wheezing or shortness of breath.   ALPRAZolam 0.5 MG tablet Commonly known as:  XANAX Take 1 mg by mouth 3 (three) times daily as needed. Anxiety   BOTOX IJ Inject as directed every 3 (three) months. For migraines   cyanocobalamin 1000 MCG/ML injection Commonly known as:  (VITAMIN B-12) Inject 1,000 mcg into the muscle once a week. Reported on 03/25/2015    DEPO-MEDROL IJ Inject 1 each as directed daily as needed (allergic reaction.).   diphenhydrAMINE 50 MG tablet Commonly known as:  BENADRYL Take 50 mg by mouth every 4 (four) hours as needed for itching (prn , alternates with hydroxyzine).   EPINEPHrine 0.3 mg/0.3 mL Soaj injection Commonly known as:  EPIPEN 2-PAK Inject 0.3 mLs (0.3 mg total) into the muscle as needed (in the event of a severe life-threatening reaction).   estradiol 0.5 MG tablet Commonly known as:  ESTRACE Take 1 mg by mouth at bedtime.   EVZIO 0.4 MG/0.4ML Soaj Generic drug:  Naloxone HCl AUTO INJECT AS NEEDED IN CASE OF EMERGENCY WHERE GRANDCHILDREN MAY GET A HOLD OF PATIENTS PAIN MEDICATIONS.   fluticasone 50 MCG/ACT nasal spray Commonly known as:  FLONASE PLACE 2 SPRAYS INTO BOTH NOSTRILS DAILY.   HYDROmorphone 2 MG tablet Commonly known as:  DILAUDID Take 1-3 tablets (2-6 mg total) by mouth every 4 (four) hours as needed for moderate pain or severe pain. What changed:  how much to take   hydrOXYzine 25 MG tablet Commonly known as:  ATARAX/VISTARIL Take 50 mg by mouth every 4 (four) hours as needed for itching. Alternates with benadryl   ketoconazole 2 % shampoo Commonly known as:  NIZORAL APPLICATIONS APPLY ON THE SKIN [place] ONCE WEEKLY.   levothyroxine 50 MCG tablet Commonly known as:  SYNTHROID, LEVOTHROID Take 200 mcg by mouth daily. Pt can only take name brand   mometasone-formoterol 200-5 MCG/ACT Aero Commonly known as:  DULERA Inhale 2 puffs into the lungs 2 (two) times daily.   multivitamin with minerals Tabs tablet Take 1 tablet by mouth at bedtime.   promethazine 25 MG tablet Commonly known as:  PHENERGAN Take 25-50 mg by mouth 2 (two) times daily as needed for nausea or vomiting.   propranolol 20 MG tablet Commonly known as:  INDERAL Take 20 mg by mouth 2 (two) times daily.   RECLAST IV Inject into the vein as directed. Yearly --  Last dose Oct 2015   SUMAtriptan 100 MG  tablet Commonly known as:  IMITREX Take 1 tablet at earliest onset of headache.  May repeat once in 2 hours if headache persists or recurs.  Do not exceed 2 tabs in 24 hours   topiramate 25 MG tablet Commonly known as:  TOPAMAX Take 75 mg by mouth 2 (two) times daily.   UNABLE TO FIND once a week. Allergy injections - once  a week 2 shots       Diagnostic Studies: US Venous Img Lower Bilateral  Result Date: 12/18/2015 CLINICAL DATA:  Bilateral lower extremity pain and swelling for the past 3 days. History of skin cancer, smoking and recent shoulder surgery. Evaluate for DVT. EXAM: BILATERAL LOWER EXTREMITY VENOUS DOPPLER ULTRASOUND TECHNIQUE: Gray-scale sonography with graded  compression, as well as color Doppler and duplex ultrasound were performed to evaluate the lower extremity deep venous systems from the level of the common femoral vein and including the common femoral, femoral, profunda femoral, popliteal and calf veins including the posterior tibial, peroneal and gastrocnemius veins when visible. The superficial great saphenous vein was also interrogated. Spectral Doppler was utilized to evaluate flow at rest and with distal augmentation maneuvers in the common femoral, femoral and popliteal veins. COMPARISON:  None. FINDINGS: RIGHT LOWER EXTREMITY Common Femoral Vein: No evidence of thrombus. Normal compressibility, respiratory phasicity and response to augmentation. Saphenofemoral Junction: No evidence of thrombus. Normal compressibility and flow on color Doppler imaging. Profunda Femoral Vein: No evidence of thrombus. Normal compressibility and flow on color Doppler imaging. Femoral Vein: No evidence of thrombus. Normal compressibility, respiratory phasicity and response to augmentation. Popliteal Vein: No evidence of thrombus. Normal compressibility, respiratory phasicity and response to augmentation. Calf Veins: No evidence of thrombus. Normal compressibility and flow on color Doppler  imaging. Superficial Great Saphenous Vein: No evidence of thrombus. Normal compressibility and flow on color Doppler imaging. Venous Reflux:  None. Other Findings:  None. LEFT LOWER EXTREMITY Common Femoral Vein: No evidence of thrombus. Normal compressibility, respiratory phasicity and response to augmentation. Saphenofemoral Junction: No evidence of thrombus. Normal compressibility and flow on color Doppler imaging. Profunda Femoral Vein: No evidence of thrombus. Normal compressibility and flow on color Doppler imaging. Femoral Vein: No evidence of thrombus. Normal compressibility, respiratory phasicity and response to augmentation. Popliteal Vein: No evidence of thrombus. Normal compressibility, respiratory phasicity and response to augmentation. Calf Veins: No evidence of thrombus. Normal compressibility and flow on color Doppler imaging. Superficial Great Saphenous Vein: No evidence of thrombus. Normal compressibility and flow on color Doppler imaging. Venous Reflux:  None. Other Findings:  None. IMPRESSION: No evidence of DVT within either lower extremity. Electronically Signed   By: Sandi Mariscal M.D.   On: 12/18/2015 16:55   Dg Chest Port 1 View  Result Date: 12/04/2015 CLINICAL DATA:  Central catheter placement EXAM: PORTABLE CHEST 1 VIEW COMPARISON:  October 16, 2015 FINDINGS: Central catheter tip is in the superior vena cava near the cavoatrial junction. No pneumothorax. Lungs are clear. Heart size and pulmonary vascularity are normal. No adenopathy. There is a total shoulder replacement on the left. There are surgical clips in the cervicothoracic junction region. IMPRESSION: Central catheter tip in superior vena cava near the cavoatrial junction. No pneumothorax. No edema or consolidation. Postoperative change at the cervical -thoracic junction as well as total shoulder replacement on the left. Electronically Signed   By: Lowella Grip III M.D.   On: 12/04/2015 10:26   Dg Shoulder Left  Port  Result Date: 12/04/2015 CLINICAL DATA:  Status post shoulder arthropathy on the left EXAM: LEFT SHOULDER - 1 VIEW COMPARISON:  Left shoulder MRI November 10, 2015 FINDINGS: Frontal view obtained. There is a total shoulder replacement with prosthetic component well-seated. No acute fracture or dislocation. No erosive change. Visualized left lung clear peer IMPRESSION: Prosthesis well-seated in the proximal humerus on frontal view. No fracture or dislocation evident. Electronically Signed   By: Lowella Grip III M.D.   On: 12/04/2015 10:24    Disposition: 01-Home or Self Care  Discharge Instructions    Call MD / Call 911    Complete by:  As directed    If you experience chest pain or shortness of breath, CALL 911 and be transported to the hospital emergency room.  If you develope  a fever above 101 F, pus (white drainage) or increased drainage or redness at the wound, or calf pain, call your surgeon's office.   Constipation Prevention    Complete by:  As directed    Drink plenty of fluids.  Prune juice may be helpful.  You may use a stool softener, such as Colace (over the counter) 100 mg twice a day.  Use MiraLax (over the counter) for constipation as needed.   Diet - low sodium heart healthy    Complete by:  As directed    Increase activity slowly as tolerated    Complete by:  As directed       Follow-up Information    Nita Sells, MD. Schedule an appointment as soon as possible for a visit in 2 weeks.   Specialty:  Orthopedic Surgery Contact information: [place] [address] [phone]            Signed: Grier Mitts 12/22/2015, 2:26 PM

## 2016-01-06 ENCOUNTER — Ambulatory Visit (INDEPENDENT_AMBULATORY_CARE_PROVIDER_SITE_OTHER): Payer: BLUE CROSS/BLUE SHIELD | Admitting: *Deleted

## 2016-01-06 DIAGNOSIS — J309 Allergic rhinitis, unspecified: Secondary | ICD-10-CM

## 2016-01-12 DIAGNOSIS — E538 Deficiency of other specified B group vitamins: Secondary | ICD-10-CM | POA: Diagnosis not present

## 2016-01-12 DIAGNOSIS — M810 Age-related osteoporosis without current pathological fracture: Secondary | ICD-10-CM | POA: Diagnosis not present

## 2016-01-19 ENCOUNTER — Other Ambulatory Visit: Payer: Self-pay | Admitting: Pain Medicine

## 2016-01-19 ENCOUNTER — Telehealth: Payer: Self-pay | Admitting: Pain Medicine

## 2016-01-19 DIAGNOSIS — G90521 Complex regional pain syndrome I of right lower limb: Secondary | ICD-10-CM

## 2016-01-19 NOTE — Telephone Encounter (Signed)
Patient having sympathetic nerve pain and wants to come in to have sympathetic nerves burnt? Has not been here for over six months, does she need eval appt and also will need order put in, is this a Radiofrequency?

## 2016-01-19 NOTE — Progress Notes (Signed)
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 I have scheduled her to return for a palliative radiofrequency of the lumbar sympathetic chain.

## 2016-01-19 NOTE — Telephone Encounter (Signed)
Order put in by Dr Dossie Arbour for RF .  Pre procedure instructions given.  Sent to Angie for PA.

## 2016-01-21 ENCOUNTER — Other Ambulatory Visit: Payer: Self-pay | Admitting: Pain Medicine

## 2016-01-21 DIAGNOSIS — Z471 Aftercare following joint replacement surgery: Secondary | ICD-10-CM | POA: Diagnosis not present

## 2016-01-21 DIAGNOSIS — Z96612 Presence of left artificial shoulder joint: Secondary | ICD-10-CM | POA: Diagnosis not present

## 2016-01-21 DIAGNOSIS — G8929 Other chronic pain: Secondary | ICD-10-CM

## 2016-01-21 DIAGNOSIS — M87012 Idiopathic aseptic necrosis of left shoulder: Secondary | ICD-10-CM | POA: Diagnosis not present

## 2016-01-21 DIAGNOSIS — M79604 Pain in right leg: Secondary | ICD-10-CM

## 2016-01-21 DIAGNOSIS — G90521 Complex regional pain syndrome I of right lower limb: Secondary | ICD-10-CM

## 2016-01-30 ENCOUNTER — Ambulatory Visit (INDEPENDENT_AMBULATORY_CARE_PROVIDER_SITE_OTHER): Payer: BLUE CROSS/BLUE SHIELD

## 2016-01-30 ENCOUNTER — Ambulatory Visit (INDEPENDENT_AMBULATORY_CARE_PROVIDER_SITE_OTHER): Payer: BLUE CROSS/BLUE SHIELD | Admitting: Neurology

## 2016-01-30 ENCOUNTER — Encounter (HOSPITAL_COMMUNITY): Payer: Self-pay | Admitting: Orthopedic Surgery

## 2016-01-30 DIAGNOSIS — G43011 Migraine without aura, intractable, with status migrainosus: Secondary | ICD-10-CM | POA: Diagnosis not present

## 2016-01-30 DIAGNOSIS — J309 Allergic rhinitis, unspecified: Secondary | ICD-10-CM | POA: Diagnosis not present

## 2016-01-30 MED ORDER — METOCLOPRAMIDE HCL 5 MG/ML IJ SOLN
10.0000 mg | Freq: Once | INTRAVENOUS | Status: AC
  Administered 2016-01-30: 10 mg via INTRAVENOUS

## 2016-01-30 MED ORDER — ONABOTULINUMTOXINA 100 UNITS IJ SOLR
100.0000 [IU] | Freq: Once | INTRAMUSCULAR | Status: AC
  Administered 2016-01-30: 100 [IU] via INTRAMUSCULAR

## 2016-01-30 MED ORDER — PROMETHAZINE HCL 25 MG PO TABS: 25.0000 mg | ORAL_TABLET | Freq: Two times a day (BID) | ORAL | 3 refills | Status: DC | PRN

## 2016-01-30 MED ORDER — DIPHENHYDRAMINE HCL 50 MG/ML IJ SOLN
25.0000 mg | Freq: Once | INTRAMUSCULAR | Status: AC
  Administered 2016-01-30: 25 mg via INTRAVENOUS

## 2016-01-30 MED ORDER — KETOROLAC TROMETHAMINE 60 MG/2ML IM SOLN
60.0000 mg | Freq: Once | INTRAMUSCULAR | Status: AC
  Administered 2016-01-30: 60 mg via INTRAMUSCULAR

## 2016-01-30 NOTE — Progress Notes (Signed)
[facility]   Procedure Note Botox  Attending: Dr. Metta Clines  Preoperative Diagnosis(es): Chronic migraine  Consent obtained from: The patient Benefits discussed included, but were not limited to decreased muscle tightness, increased joint range of motion, and decreased pain.  Risk discussed included, but were not limited pain and discomfort, bleeding, bruising, excessive weakness, venous thrombosis, muscle atrophy and dysphagia.  Anticipated outcomes of the procedure as well as he risks and benefits of the alternatives to the procedure, and the roles and tasks of the personnel to be involved, were discussed with the patient, and the patient consents to the procedure and agrees to proceed. A copy of the patient medication guide was given to the patient which explains the blackbox warning.  Patients identity and treatment sites confirmed Yes.  .  Details of Procedure: Skin was cleaned with alcohol. Prior to injection, the needle plunger was aspirated to make sure the needle was not within a blood vessel.  There was no blood retrieved on aspiration.    Following is a summary of the muscles injected  And the amount of Botulinum toxin used:  Dilution 200 units of Botox was reconstituted with 4 ml of preservative free normal saline. Time of reconstitution: At the time of the office visit (<30 minutes prior to injection)   Injections  155 total units of Botox was injected with a 30 gauge needle.  Injection Sites: L occipitalis: 15 units- 3 sites  R occiptalis: 15 units- 3 sites  L upper trapezius: 15 units- 3 sites R upper trapezius: 15 units- 3 sits          L paraspinal: 10 units- 2 sites R paraspinal: 10 units- 2 sites  Face L frontalis(2 injection sites):10 units   R frontalis(2 injection sites):10 units         L corrugator: 5 units   R corrugator: 5 units           Procerus: 5 units   L temporalis: 20 units R temporalis: 20 units   Agent:  200 units of botulinum Type  A (Onobotulinum Toxin type A) was reconstituted with 4 ml of preservative free normal saline.  Time of reconstitution: At the time of the office visit (<30 minutes prior to injection)     Total injected (Units): 155  Total wasted (Units): 30  Patient tolerated procedure well without complications.   Reinjection is anticipated in 3 months. Return to clinic in 2 week.   She missed her last round of Botox due to shoulder surgery.  Over the past several days, she has had intractable migraine, not responding to the sumatriptan this time.  We will give her a headache cocktail today (she is accompanied by her daughter who is her driver).  We will refill her Phenergan.  She has a follow up office visit in 2 weeks to discuss changes in her preventative medications (topiramate or propranolol).  Adam R. Tomi Likens, DO

## 2016-02-05 ENCOUNTER — Ambulatory Visit: Payer: Self-pay | Admitting: Neurology

## 2016-02-12 ENCOUNTER — Ambulatory Visit: Payer: BLUE CROSS/BLUE SHIELD | Admitting: Neurology

## 2016-02-13 ENCOUNTER — Encounter: Payer: Self-pay | Admitting: Neurology

## 2016-02-13 DIAGNOSIS — J069 Acute upper respiratory infection, unspecified: Secondary | ICD-10-CM | POA: Diagnosis not present

## 2016-02-18 ENCOUNTER — Telehealth: Payer: Self-pay | Admitting: Pain Medicine

## 2016-02-18 NOTE — Telephone Encounter (Signed)
Patient states because she has had to wait so long on RF that now her right knee is hurting and she wants to book appt for getting this done as well. There are no orders in for RF or knee, informed patient Dr. Dossie Arbour would not be in until next week and we would discuss with him then and call her. She has not been in for eval, states she only comes in to get shot or Rf,

## 2016-02-25 ENCOUNTER — Other Ambulatory Visit: Payer: Self-pay | Admitting: Pain Medicine

## 2016-02-25 ENCOUNTER — Telehealth: Payer: Self-pay

## 2016-02-25 DIAGNOSIS — Z96651 Presence of right artificial knee joint: Secondary | ICD-10-CM

## 2016-02-25 DIAGNOSIS — G8929 Other chronic pain: Secondary | ICD-10-CM

## 2016-02-25 DIAGNOSIS — M25561 Pain in right knee: Principal | ICD-10-CM

## 2016-02-25 NOTE — Telephone Encounter (Signed)
Spoke with patient to clarify previous message.  Patient states her knee is hurting, but does not want anything done until after the Radiofrequency that is to be done in February.  Informed patient to call us if she would like something done prior to the RF.

## 2016-02-26 DIAGNOSIS — Z96612 Presence of left artificial shoulder joint: Secondary | ICD-10-CM | POA: Diagnosis not present

## 2016-02-26 DIAGNOSIS — M25512 Pain in left shoulder: Secondary | ICD-10-CM | POA: Diagnosis not present

## 2016-03-01 ENCOUNTER — Ambulatory Visit: Payer: BLUE CROSS/BLUE SHIELD | Admitting: Neurology

## 2016-03-01 DIAGNOSIS — E538 Deficiency of other specified B group vitamins: Secondary | ICD-10-CM | POA: Diagnosis not present

## 2016-03-16 ENCOUNTER — Ambulatory Visit: Payer: BLUE CROSS/BLUE SHIELD | Admitting: Neurology

## 2016-03-17 DIAGNOSIS — G894 Chronic pain syndrome: Secondary | ICD-10-CM | POA: Diagnosis not present

## 2016-03-17 DIAGNOSIS — M79604 Pain in right leg: Secondary | ICD-10-CM | POA: Diagnosis not present

## 2016-03-17 DIAGNOSIS — M25512 Pain in left shoulder: Secondary | ICD-10-CM | POA: Diagnosis not present

## 2016-03-17 DIAGNOSIS — M65312 Trigger thumb, left thumb: Secondary | ICD-10-CM | POA: Diagnosis not present

## 2016-03-17 DIAGNOSIS — M79641 Pain in right hand: Secondary | ICD-10-CM | POA: Diagnosis not present

## 2016-03-17 DIAGNOSIS — Z96612 Presence of left artificial shoulder joint: Secondary | ICD-10-CM | POA: Diagnosis not present

## 2016-03-17 DIAGNOSIS — Z79891 Long term (current) use of opiate analgesic: Secondary | ICD-10-CM | POA: Diagnosis not present

## 2016-03-17 DIAGNOSIS — G43109 Migraine with aura, not intractable, without status migrainosus: Secondary | ICD-10-CM | POA: Diagnosis not present

## 2016-03-29 ENCOUNTER — Emergency Department (HOSPITAL_COMMUNITY)
Admission: EM | Admit: 2016-03-29 | Discharge: 2016-03-29 | Disposition: A | Discharge status: Home / Self Care | Payer: BLUE CROSS/BLUE SHIELD | Attending: Emergency Medicine | Admitting: Emergency Medicine

## 2016-03-29 ENCOUNTER — Emergency Department (HOSPITAL_COMMUNITY): Payer: BLUE CROSS/BLUE SHIELD

## 2016-03-29 ENCOUNTER — Encounter (HOSPITAL_COMMUNITY): Payer: Self-pay | Admitting: Emergency Medicine

## 2016-03-29 DIAGNOSIS — F17200 Nicotine dependence, unspecified, uncomplicated: Secondary | ICD-10-CM | POA: Diagnosis not present

## 2016-03-29 DIAGNOSIS — R079 Chest pain, unspecified: Secondary | ICD-10-CM | POA: Insufficient documentation

## 2016-03-29 DIAGNOSIS — Z91010 Allergy to peanuts: Secondary | ICD-10-CM | POA: Insufficient documentation

## 2016-03-29 DIAGNOSIS — Z91040 Latex allergy status: Secondary | ICD-10-CM | POA: Diagnosis not present

## 2016-03-29 DIAGNOSIS — E039 Hypothyroidism, unspecified: Secondary | ICD-10-CM | POA: Insufficient documentation

## 2016-03-29 DIAGNOSIS — Z79899 Other long term (current) drug therapy: Secondary | ICD-10-CM | POA: Diagnosis not present

## 2016-03-29 DIAGNOSIS — Z85828 Personal history of other malignant neoplasm of skin: Secondary | ICD-10-CM | POA: Insufficient documentation

## 2016-03-29 DIAGNOSIS — I10 Essential (primary) hypertension: Secondary | ICD-10-CM | POA: Diagnosis not present

## 2016-03-29 DIAGNOSIS — Z96651 Presence of right artificial knee joint: Secondary | ICD-10-CM | POA: Insufficient documentation

## 2016-03-29 LAB — CBC
HCT: 38.7 % (ref 36.0–46.0)
Hemoglobin: 13.1 g/dL (ref 12.0–15.0)
MCH: 28.5 pg (ref 26.0–34.0)
MCHC: 33.9 g/dL (ref 30.0–36.0)
MCV: 84.3 fL (ref 78.0–100.0)
PLATELETS: 315 10*3/uL (ref 150–400)
RBC: 4.59 MIL/uL (ref 3.87–5.11)
RDW: 13.7 % (ref 11.5–15.5)
WBC: 7.5 10*3/uL (ref 4.0–10.5)

## 2016-03-29 LAB — BASIC METABOLIC PANEL
Anion gap: 8 (ref 5–15)
BUN: 20 mg/dL (ref 6–20)
CALCIUM: 9.2 mg/dL (ref 8.9–10.3)
CO2: 22 mmol/L (ref 22–32)
CREATININE: 0.86 mg/dL (ref 0.44–1.00)
Chloride: 111 mmol/L (ref 101–111)
GFR calc Af Amer: >60 mL/min (ref >60)
Glucose, Bld: 86 mg/dL (ref 65–99)
Potassium: 3.4 mmol/L — ABNORMAL LOW (ref 3.5–5.1)
SODIUM: 141 mmol/L (ref 135–145)

## 2016-03-29 LAB — I-STAT TROPONIN, ED
Troponin i, poc: 0 ng/mL (ref 0.00–0.08)
Troponin i, poc: 0 ng/mL (ref 0.00–0.08)

## 2016-03-29 MED ORDER — ONDANSETRON HCL 4 MG/2ML IJ SOLN
4.0000 mg | Freq: Once | INTRAMUSCULAR | Status: AC
  Administered 2016-03-29: 4 mg via INTRAVENOUS
  Filled 2016-03-29: qty 2

## 2016-03-29 MED ORDER — IBUPROFEN 400 MG PO TABS
400.0000 mg | ORAL_TABLET | Freq: Once | ORAL | Status: DC
  Filled 2016-03-29: qty 1

## 2016-03-29 NOTE — ED Triage Notes (Signed)
Pt from home with c/o chest pain starting this past sat with radiation to upper mid back.  Pt reports pain has continued to worsening since it's onset.  Pt additionally reports jaw pain this past Friday but reports it may be from dental work.  Pt states pain is worse with deep inspiration and has a headache.  NAD, A&O.

## 2016-03-29 NOTE — ED Provider Notes (Signed)
[facility] DEPT Provider Note   CSN: [id number] Arrival date & time: 03/29/16  1205     History   Chief Complaint Chief Complaint  Patient presents with  . Chest Pain    HPI Kari Green is a 51 y.o. female.  HPI 21yoF with hx of fibromyalgia and HTN presenting with chest pain. She states she had a dull chest pain on Friday that was not associated with SOB, N/V or diaphoresis and was not exertional. She states that the pain got better Saturday and Sunday however this morning at 6am she started having the dull chest pain again that was associated with nausea and SOB. Was not exertional or pleuritic. Pain does not radiate. No leg pain or swelling. No family hx of CAD. Also endorses a sharp Ha. Has hx of migraines but feels slightly different. Not associated with photophobia, lightheadedness or dizziness. Denies numbness or weakness or difficulty walking.  Past Medical History:  Diagnosis Date  . Anxiety   . Arthritis    right thumb  . Asthma   . Cancer [facility])    history of skin cancer  . Chronic, continuous use of opioids   . Cognitive dysfunction    patient denies  . Complication of anesthesia    "not asleep and aware of everything"  . Degenerative disc disease, cervical   . Degenerative disc disease, lumbar   . Delayed gastric emptying   . Dental crowns present   . Difficult intravenous access   . Difficulty swallowing pills   . Esophageal dysmotility   . Family history of adverse reaction to anesthesia    family - itching, hives, PONV  . Fibromyalgia   . Hemangioma of liver   . Hemangioma, renal   . History of bronchitis   . History of hiatal hernia   . History of kidney stones 04/2014  . History of pneumonia   . Hypertension   . Hypothyroidism (acquired)   . IC (interstitial cystitis)   . Irritable bowel syndrome (IBS)   . Kidney stone   . Knee pain, right    states gets "nerves burned" periodically  . Limited joint range of motion    right knee -  unable to fully extend right leg  . Memory impairment    due to pain   . Migraines   . Osteoporosis   . Pain management   . Pain syndrome, chronic   . Pneumonia 2016  . PONV (postoperative nausea and vomiting)    uncontrollable itching with anesthesia; hx. of anesthesia awareness; pt needs 50  mg of Benadryl  . PONV (postoperative nausea and vomiting)    "need medication before and need medication afterwards"  . Sciatica    bilateral  . Seasonal allergies     Patient Active Problem List   Diagnosis Date Noted  . S/P shoulder hemiarthroplasty, left 12/04/2015  . Hemoptysis 10/16/2015  . Cough 10/16/2015  . Radiculopathy 07/16/2015  . Muscle cramps at night 05/06/2015  . Acute Pain following procedure (Lumbar Sympathetic Radiofrequency Ablation) 05/06/2015  . Temporomandibular joint disorder 04/10/2015  . Buzzing in ear 04/10/2015  . Ear pain, referred 04/10/2015  . OP (osteoporosis) 04/10/2015  . 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/10/2015  . History of migraine headaches 04/10/2015  . Difficulty hearing 04/10/2015  . H/O respiratory system disease 04/10/2015  . Fibromyalgia 04/10/2015  . Deflected nasal septum 04/10/2015  . Chronic otitis externa 04/10/2015  . Abnormal auditory perception 04/10/2015  . History of allergy to IVP dye 01/31/2015  .  Chronic low back pain 01/13/2015  . CRPS (complex regional pain syndrome) type I of lower limb (Right-sided) 01/13/2015  . History of arthroplasty of right knee 01/13/2015  . Chronic pain syndrome 01/13/2015  . Chronic pain 01/13/2015  . Chronic knee pain (Location of Secondary source of pain) (Right) 01/13/2015  . Generalized anxiety disorder 01/13/2015  . Chronic lower extremity pain (Location of Primary Source of Pain) (Right) 01/13/2015  . Multiple drug allergies (codeine, Lidoderm a DC if, sulfa, latex, IVP dye, Keflex, Augmentin) 01/13/2015  . Latex allergy 01/13/2015  . Long term current use of opiate analgesic 01/13/2015  .  Long term prescription opiate use 01/13/2015  . Opiate use 01/13/2015  . Opiate dependence ([place]) 01/13/2015  . Asthma 11/15/2014  . Allergic rhinitis 11/15/2014  . Memory deficits 10/24/2013  . Left shoulder pain 03/16/2013  . Hypothyroidism 09/21/2012  . Abdominal adhesions 02/11/2012  . Abdominal pain, epigastric 12/15/2011  . Irritable bowel syndrome with constipation 02/26/2011    Past Surgical History:  Procedure Laterality Date  . ABDOMINAL HYSTERECTOMY    . ANTERIOR CERVICAL DECOMP/DISCECTOMY FUSION  03/10/2009   C5-6, C6-7  . ANTERIOR CERVICAL DECOMP/DISCECTOMY FUSION N/A 07/16/2015   Procedure: ANTERIOR CERVICAL DECOMPRESSION FUSION 4-5 WITH INSTRUMENTATION AND AUTOGRAFT;  Surgeon: Phylliss Bob, MD;  Location: [place];  Service: Orthopedics;  Laterality: N/A;  ANTERIOR CERVICAL DECOMPRESSION FUSION 4-5 WITH INSTRUMENTATION AND ALLOGRAFT  . APPENDECTOMY  ~ 1992  . AUGMENTATION MAMMAPLASTY Bilateral 1996; 2001, 2016  . BREAST SURGERY  1980's   accessory breast tissue exc.  Marland Kitchen CARPAL TUNNEL RELEASE Right 07/26/2001  . CARPAL TUNNEL RELEASE Left   . CARPOMETACARPEL SUSPENSION PLASTY Right 02/10/2015   Procedure: RIGHT THUMB SUSPENSION  ARTHROPLASTY;  Surgeon: Milly Jakob, MD;  Location: [place];  Service: Orthopedics;  Laterality: Right;  ANESTHESIA: PRE- OP BLOCK  . COLONOSCOPY W/ POLYPECTOMY    . CYSTO WITH HYDRODISTENSION  01/29/2011   Procedure: CYSTOSCOPY/HYDRODISTENSION;  Surgeon: Claybon Jabs, MD;  Location[facility];  Service: Urology;  Laterality: N/A;  . CYSTO WITH HYDRODISTENSION N/A 05/08/2014   Procedure: CYSTOSCOPY/HYDRODISTENSION WITH BILATERAL RETROGRADES;  Surgeon: Ardis Hughs, MD;  Location: [facility];  Service: Urology;  Laterality: N/A;  . CYSTO WITH HYDRODISTENSION N/A 03/27/2015   Procedure: CYSTOSCOPY/HYDRODISTENSION OF BLADDER, INSTILLATION OF MARCAINE/PYRIDIUM;  Surgeon: Ardis Hughs, MD;   Location: [facility];  Service: Urology;  Laterality: N/A;  . CYSTO/  BLADDER BX  03/08/2008  . CYSTO/ URETHRAL DILATION/ HYDRODISTENTION/ BLADDER BX  04/10/2003  . CYSTOSCOPY W/ RETROGRADES  01/29/2011   Procedure: CYSTOSCOPY WITH RETROGRADE PYELOGRAM;  Surgeon: Claybon Jabs, MD;  Location: [facility];  Service: Urology;  Laterality: Bilateral;  . CYSTOSCOPY WITH STENT PLACEMENT  2006   URETERAL  . DEBRIDEMENT RIGHT THUMB MP JOINT Right 04/10/2002  . DECOMPRESSION LEFT ULNAR NERVE, ELBOW Left 01/29/2003  . ESOPHAGOGASTRODUODENOSCOPY (EGD) WITH ESOPHAGEAL DILATION  10-26-2000  . EXCISION HAGLUND'S DEFORMITY WITH ACHILLES TENDON REPAIR Bilateral 2013-2014  . EYE SURGERY Bilateral    x4  . FISSURECTOMY  ~ 1988 x2  . GANGLION CYST EXCISION Right 02/10/2015   Procedure: REMOVAL GANGLION OF WRIST, PARTIAL ULNAR EXCISION;  Surgeon: Milly Jakob, MD;  Location: [place];  Service: Orthopedics;  Laterality: Right;  . HAND SURGERY Right 01-31-15  . HEMORROIDECTOMY    . KIDNEY STONE SURGERY    . KNEE ARTHROSCOPY Right 08/16/2000  . KNEE ARTHROSCOPY W/ LATERAL RELEASE  Right 11/29/2000   states 13 surgeries on right knee  . KNEE ARTHROSCOPY WITH FULKERSON SLIDE Right 11/29/2000  . LAPAROSCOPIC ASSISTED VAGINAL HYSTERECTOMY  Feb 1999  . LAPAROSCOPIC CHOLECYSTECTOMY  03/04/2002  . LAPAROSCOPIC LYSIS OF ADHESIONS  02/11/2012   Procedure: LAPAROSCOPIC LYSIS OF ADHESIONS;  Surgeon: Cheri Fowler, MD;  Location: [facility];  Service: Gynecology;;  . LAPAROSCOPY  02/11/2012   Procedure: LAPAROSCOPY DIAGNOSTIC;  Surgeon: Cheri Fowler, MD;  Location: [facility];  Service: Gynecology;  Laterality: N/A;  Diagnostic  Operative Laparoscopic   . LAPAROSCOPY ADHESIOLYSIS AND REMOVAL POSSIBLE RIGHT OVARY REMNANT  04/18/2000  . LAPAROSCOPY LYSIS ADHESIONS/  BILATERAL SALPINGOOPHORECTOMY/  [place] OF ENDOMETRIOSIS  1998  . MELANOMA EXCISION Left 2013   "behind knee"   .  MELANOMA EXCISION     scalp  . NASAL SINUS SURGERY  2013  . NEGATIVE SLEEP STUDY  2006 approx .  per pt  . REFRACTIVE SURGERY Bilateral ~ 1999; ~ 2013  . RIGHT THUMB FUSION OF MPJ Right 10/03/2002  . RIGHT TOTAL KNEE REVISION ARTHROPLASTY Right 03/14/2007  . SHOULDER HEMI-ARTHROPLASTY Left 12/04/2015   Procedure: SHOULDER HEMI-ARTHROPLASTY;  Surgeon: Tania Ade, MD;  Location: [place];  Service: Orthopedics;  Laterality: Left;  Left shoulder hemiarthroplasty  . SHOULDER SURGERY Left   . TONSILLECTOMY AND ADENOIDECTOMY  ~ 1989  . TOTAL KNEE ARTHROPLASTY Right 09/10/2003; 12/08/2012   x3  . TOTAL THYROIDECTOMY  1993  . TRANSANAL HEMORRHOIDAL DEARTERIALIZATION  03/26/2011  . TRANSTHORACIC ECHOCARDIOGRAM  09/22/2012  . VIDEO BRONCHOSCOPY Bilateral 10/20/2015   Procedure: VIDEO BRONCHOSCOPY WITHOUT FLUORO;  Surgeon: Collene Gobble, MD;  Location: [place];  Service: Cardiopulmonary;  Laterality: Bilateral;    OB History    Gravida Para Term Preterm AB Living   0 0 0 0 0     SAB TAB Ectopic Multiple Live Births   0 0 0           Home Medications    Prior to Admission medications   Medication Sig Start Date End Date Taking? Authorizing Provider  albuterol (PROAIR HFA) 108 (90 Base) MCG/ACT inhaler Inhale 2 puffs into the lungs every 4 (four) hours as needed for wheezing or shortness of breath. 08/06/15  Yes Adelina Mings, MD  albuterol (PROVENTIL) (2.5 MG/3ML) 0.083% nebulizer solution Take 3 mLs (2.5 mg total) by nebulization every 4 (four) hours as needed for wheezing (or coughing spells). 08/06/15  Yes Adelina Mings, MD  ALPRAZolam Duanne Moron) 0.5 MG tablet Take 1 mg by mouth 3 (three) times daily as needed. Anxiety   Yes Historical Provider, MD  cyanocobalamin (,VITAMIN B-12,) 1000 MCG/ML injection Inject 1,000 mcg into the muscle once a week. Reported on 03/25/2015   Yes Historical Provider, MD  diphenhydrAMINE (BENADRYL) 50 MG tablet Take 50 mg by mouth every 4 (four) hours  as needed for itching.    Yes Historical Provider, MD  EPINEPHrine (EPIPEN 2-PAK) 0.3 mg/0.3 mL IJ SOAJ injection Inject 0.3 mLs (0.3 mg total) into the muscle as needed (in the event of a severe life-threatening reaction). 12/09/14  Yes Charlies Silvers, MD  estradiol (ESTRACE) 0.5 MG tablet Take 1 mg by mouth at bedtime.    Yes Historical Provider, MD  EVZIO 0.4 MG/0.4ML SOAJ AUTO INJECT AS NEEDED IN CASE OF EMERGENCY WHERE GRANDCHILDREN MAY GET A HOLD OF PATIENTS PAIN MEDICATIONS. 11/14/14  Yes Historical Provider, MD  fluticasone (FLONASE) 50 MCG/ACT nasal spray PLACE 2 SPRAYS INTO BOTH NOSTRILS DAILY. 06/09/15  Yes  Charlies Silvers, MD  gabapentin (NEURONTIN) 300 MG capsule Take 300 mg by mouth 3 (three) times daily. 03/15/16  Yes Historical Provider, MD  HYDROmorphone (DILAUDID) 2 MG tablet Take 1-3 tablets (2-6 mg total) by mouth every 4 (four) hours as needed for moderate pain or severe pain. 12/05/15  Yes Danielle Laliberte, PA-C  hydrOXYzine (ATARAX/VISTARIL) 25 MG tablet Take 50 mg by mouth every 4 (four) hours as needed for itching. Alternates with ben   Yes Historical Provider, MD  ibuprofen (ADVIL,MOTRIN) 800 MG tablet Take 800 mg by mouth every 8 (eight) hours as needed for headache or mild pain.  03/12/16  Yes Historical Provider, MD  ketoconazole (NIZORAL) 2 % shampoo One application once a month as needed for flaking 11/19/14  Yes Historical Provider, MD  levothyroxine (SYNTHROID, LEVOTHROID) 50 MCG tablet Take 200 mcg by mouth daily. Pt can only take name brand   Yes Historical Provider, MD  meloxicam (MOBIC) 15 MG tablet Take 15 mg by mouth daily.   Yes Historical Provider, MD  MethylPREDNISolone Acetate (DEPO-MEDROL IJ) Inject 1 each as directed daily as needed (allergic reaction.).   Yes Historical Provider, MD  Multiple Vitamin (MULTIVITAMIN WITH MINERALS) TABS tablet Take 1 tablet by mouth at bedtime.   Yes Historical Provider, MD  OnabotulinumtoxinA (BOTOX IJ) Inject as directed every  3 (three) months. For migraines   Yes Historical Provider, MD  promethazine (PHENERGAN) 25 MG tablet Take 1-2 tablets (25-50 mg total) by mouth 2 (two) times daily as needed for nausea or vomiting. 01/30/16  Yes Adam Telford Nab, DO  propranolol (INDERAL) 20 MG tablet Take 20 mg by mouth 2 (two) times daily. 05/12/15  Yes Historical Provider, MD  SUMAtriptan (IMITREX) 100 MG tablet Take 1 tablet at earliest onset of headache.  May repeat once in 2 hours if headache persists or recurs.  Do not exceed 2 tabs in 24 hours 09/08/15  Yes Adam Telford Nab, DO  SUMAtriptan 6 MG/0.5ML SOAJ Inject 6 mg into the skin daily as needed (for migraine headache.). 12/19/15  Yes Adam Telford Nab, DO  topiramate (TOPAMAX) 25 MG tablet Take 75 mg by mouth 2 (two) times daily. 04/15/15  Yes Historical Provider, MD  Zoledronic Acid (RECLAST IV) Inject into the vein as directed. Yearly --  Last dose Oct 2015   Yes Historical Provider, MD  furosemide (LASIX) 20 MG tablet Take 1 tablet (20 mg total) by mouth daily. Patient not taking: Reported on 03/29/2016 12/18/15   Dorie Rank, MD  mometasone-formoterol [facility]) 200-5 MCG/ACT AERO Inhale 2 puffs into the lungs 2 (two) times daily. Patient not taking: Reported on 03/29/2016 08/19/15   Adelina Mings, MD  UNABLE TO FIND once a week. Allergy injections - Two shots once weekly    Historical Provider, MD    Family History Family History  Problem Relation Age of Onset  . Anesthesia problems Mother   . Cancer Mother     ovarian  . Leukemia Mother   . Cancer Daughter     cervical  . Cancer Maternal Aunt     breast    Social History Social History  Substance Use Topics  . Smoking status: Current Some Day Smoker    Last attempt to quit: 02/22/2006  . Smokeless tobacco: Never Used  . Alcohol use 0.0 oz/week     Comment: rarely     Allergies   [place]; Latex; Nickel; Peanuts [peanut oil]; Red dye; Azithromycin; Chloraprep one step [chlorhexidine gluconate]; Corticosteroids;  Flexeril [  cyclobenzaprine]; Morphine and related; Other; Percocet [oxycodone-acetaminophen]; Supartz [sodium hyaluronate]; Surgical lubricant; Yellow dyes (non-tartrazine); Prednisone; Soap; and Tape   Review of Systems Review of Systems  Constitutional: Negative for chills and fever.  HENT: Negative for ear pain and sore throat.   Eyes: Negative for pain and visual disturbance.  Respiratory: Negative for cough and shortness of breath.   Cardiovascular: Positive for chest pain. Negative for palpitations.  Gastrointestinal: Negative for abdominal pain and vomiting.  Genitourinary: Negative for dysuria and hematuria.  Musculoskeletal: Negative for arthralgias and back pain.  Skin: Negative for color change and rash.  Neurological: Positive for headaches. Negative for dizziness, seizures, syncope, weakness and numbness.  All other systems reviewed and are negative.    Physical Exam Updated Vital Signs BP 120/87   Pulse 77   Temp 98 F (36.7 C) (Oral)   Resp 18   Ht 5\' 3"  (1.6 m)   Wt 57.2 kg   SpO2 100%   BMI 22.32 kg/m   Physical Exam  Constitutional: She is oriented to person, place, and time. She appears well-developed and well-nourished. No distress.  HENT:  Head: Normocephalic and atraumatic.  Eyes: Conjunctivae are normal.  Neck: Normal range of motion. Neck supple.  Cardiovascular: Normal rate, regular rhythm, S1 normal, S2 normal, normal heart sounds, intact distal pulses and normal pulses.   No murmur heard. Pulmonary/Chest: Effort normal and breath sounds normal. No respiratory distress. She has no decreased breath sounds. She has no wheezes. She has no rhonchi.  Abdominal: Soft. There is no tenderness.  Musculoskeletal: She exhibits no edema.  Neurological: She is alert and oriented to person, place, and time. She has normal strength and normal reflexes. No cranial nerve deficit or sensory deficit. She displays a negative Romberg sign. Coordination and gait normal.  GCS eye subscore is 4. GCS verbal subscore is 5. GCS motor subscore is 6.  Skin: Skin is warm and dry. Capillary refill takes less than 2 seconds.  Psychiatric: She has a normal mood and affect.  Nursing note and vitals reviewed.    ED Treatments / Results  Labs (all labs ordered are listed, but only abnormal results are displayed) Labs Reviewed  BASIC METABOLIC PANEL - Abnormal; Notable for the following:       Result Value   Potassium 3.4 (*)    All other components within normal limits  CBC  I-STAT TROPOININ, ED  I-STAT TROPOININ, ED    EKG  EKG Interpretation  Date/Time:  Monday March 29 2016 12:10:13 EST Ventricular Rate:  80 PR Interval:  180 QRS Duration: 74 QT Interval:  344 QTC Calculation: 396 R Axis:   48 Text Interpretation:  Normal sinus rhythm Nonspecific T wave abnormality No significant change since last tracing Confirmed by Ashok Cordia  MD, Lennette Bihari ([id number]) on 03/29/2016 5:36:34 PM       Radiology Dg Chest 2 View  Result Date: 03/29/2016 CLINICAL DATA:  Chest pain. EXAM: CHEST  2 VIEW COMPARISON:  Radiograph of December 04, 2015. FINDINGS: The heart size and mediastinal contours are within normal limits. Both lungs are clear. No pneumothorax or pleural effusion is noted. Status post left shoulder arthroplasty. IMPRESSION: No active cardiopulmonary disease. Electronically Signed   By: Marijo Conception, M.D.   On: 03/29/2016 12:35    Procedures Procedures (including critical care time)  Medications Ordered in ED Medications  ibuprofen (ADVIL,MOTRIN) tablet 400 mg (400 mg Oral Refused 03/29/16 1825)  ondansetron (ZOFRAN) injection 4 mg (4 mg Intravenous Given 03/29/16  1716)     Initial Impression / Assessment and Plan / ED Course  I have reviewed the triage vital signs and the nursing notes.  Pertinent labs & imaging results that were available during my care of the patient were reviewed by me and considered in my medical decision making (see chart for  details).    51 year old female presenting with chest pain and headache. Headache is been going on for 3 or 4 days and has not gotten worse. Pain was 5 out of 10 at onset gradually worsened to 7. She has a nonfocal neuro exam. Doubt intracranial hemorrhage or dissection. Her EKG shows normal sinus rhythm with nonspecific T-wave changes however is unchanged from prior EKGs. Chest x-ray is unremarkable. Labs are reassuring and 3 hour troponin is negative. Heart scores 3. Chest x-ray without evidence of pneumonia or pneumothorax or wide mediastinum. She is not hypertensive and pain is not sharp and does not radiate to the back, doubt dissection. As she is a low heart score and low suspicion for ACS I will have her follow-up with her primary care provider the next available appointment for further cardiac workup. She'll return precautions were given and patient was discharged in good condition.  Patient care discussed and supervised by my attending, Dr. Ashok Cordia. Drucie Ip, MD   Final Clinical Impressions(s) / ED Diagnoses   Final diagnoses:  Nonspecific chest pain    New Prescriptions New Prescriptions   No medications on file     Darrin Apodaca [place] Lyriq Finerty, MD 03/29/16 1907    Lajean Saver, MD 03/30/16 0000

## 2016-03-29 NOTE — Discharge Instructions (Signed)
Please follow up with your PCP at next available appointment for further cardiac workup.

## 2016-03-31 DIAGNOSIS — M436 Torticollis: Secondary | ICD-10-CM | POA: Diagnosis not present

## 2016-03-31 DIAGNOSIS — M79641 Pain in right hand: Secondary | ICD-10-CM | POA: Diagnosis not present

## 2016-03-31 DIAGNOSIS — M25441 Effusion, right hand: Secondary | ICD-10-CM | POA: Diagnosis not present

## 2016-03-31 DIAGNOSIS — M79642 Pain in left hand: Secondary | ICD-10-CM | POA: Diagnosis not present

## 2016-03-31 DIAGNOSIS — R0789 Other chest pain: Secondary | ICD-10-CM | POA: Diagnosis not present

## 2016-03-31 DIAGNOSIS — J3089 Other allergic rhinitis: Secondary | ICD-10-CM | POA: Diagnosis not present

## 2016-04-01 ENCOUNTER — Other Ambulatory Visit: Payer: Self-pay | Admitting: Family Medicine

## 2016-04-01 ENCOUNTER — Other Ambulatory Visit: Payer: Self-pay | Admitting: Internal Medicine

## 2016-04-01 DIAGNOSIS — M436 Torticollis: Secondary | ICD-10-CM

## 2016-04-01 DIAGNOSIS — G894 Chronic pain syndrome: Secondary | ICD-10-CM | POA: Diagnosis not present

## 2016-04-01 DIAGNOSIS — Z79891 Long term (current) use of opiate analgesic: Secondary | ICD-10-CM | POA: Diagnosis not present

## 2016-04-01 DIAGNOSIS — R079 Chest pain, unspecified: Secondary | ICD-10-CM

## 2016-04-01 DIAGNOSIS — M25512 Pain in left shoulder: Secondary | ICD-10-CM | POA: Diagnosis not present

## 2016-04-01 DIAGNOSIS — G43109 Migraine with aura, not intractable, without status migrainosus: Secondary | ICD-10-CM | POA: Diagnosis not present

## 2016-04-01 DIAGNOSIS — M79641 Pain in right hand: Secondary | ICD-10-CM | POA: Diagnosis not present

## 2016-04-01 DIAGNOSIS — R197 Diarrhea, unspecified: Secondary | ICD-10-CM | POA: Diagnosis not present

## 2016-04-01 DIAGNOSIS — M79604 Pain in right leg: Secondary | ICD-10-CM | POA: Diagnosis not present

## 2016-04-02 DIAGNOSIS — J301 Allergic rhinitis due to pollen: Secondary | ICD-10-CM | POA: Diagnosis not present

## 2016-04-05 ENCOUNTER — Ambulatory Visit
Admission: RE | Admit: 2016-04-05 | Discharge: 2016-04-05 | Disposition: A | Discharge status: Home / Self Care | Payer: BLUE CROSS/BLUE SHIELD | Source: Ambulatory Visit | Attending: Internal Medicine | Admitting: Internal Medicine

## 2016-04-05 ENCOUNTER — Other Ambulatory Visit: Payer: Self-pay | Admitting: Internal Medicine

## 2016-04-05 DIAGNOSIS — G8929 Other chronic pain: Secondary | ICD-10-CM

## 2016-04-05 DIAGNOSIS — M436 Torticollis: Secondary | ICD-10-CM

## 2016-04-05 DIAGNOSIS — R1013 Epigastric pain: Secondary | ICD-10-CM | POA: Diagnosis not present

## 2016-04-05 DIAGNOSIS — R519 Headache, unspecified: Secondary | ICD-10-CM

## 2016-04-05 DIAGNOSIS — R079 Chest pain, unspecified: Secondary | ICD-10-CM

## 2016-04-05 DIAGNOSIS — R51 Headache: Secondary | ICD-10-CM

## 2016-04-05 DIAGNOSIS — G43909 Migraine, unspecified, not intractable, without status migrainosus: Secondary | ICD-10-CM | POA: Diagnosis not present

## 2016-04-06 DIAGNOSIS — M436 Torticollis: Secondary | ICD-10-CM | POA: Diagnosis not present

## 2016-04-06 DIAGNOSIS — E538 Deficiency of other specified B group vitamins: Secondary | ICD-10-CM | POA: Diagnosis not present

## 2016-04-06 DIAGNOSIS — M79641 Pain in right hand: Secondary | ICD-10-CM | POA: Diagnosis not present

## 2016-04-06 DIAGNOSIS — M79642 Pain in left hand: Secondary | ICD-10-CM | POA: Diagnosis not present

## 2016-04-06 DIAGNOSIS — R0789 Other chest pain: Secondary | ICD-10-CM | POA: Diagnosis not present

## 2016-04-07 DIAGNOSIS — R1013 Epigastric pain: Secondary | ICD-10-CM | POA: Diagnosis not present

## 2016-04-07 DIAGNOSIS — R079 Chest pain, unspecified: Secondary | ICD-10-CM | POA: Diagnosis not present

## 2016-04-07 DIAGNOSIS — R933 Abnormal findings on diagnostic imaging of other parts of digestive tract: Secondary | ICD-10-CM | POA: Diagnosis not present

## 2016-04-08 ENCOUNTER — Other Ambulatory Visit: Payer: Self-pay | Admitting: Gastroenterology

## 2016-04-12 ENCOUNTER — Ambulatory Visit (HOSPITAL_BASED_OUTPATIENT_CLINIC_OR_DEPARTMENT_OTHER): Payer: BLUE CROSS/BLUE SHIELD | Admitting: Pain Medicine

## 2016-04-12 ENCOUNTER — Encounter: Payer: Self-pay | Admitting: Pain Medicine

## 2016-04-12 ENCOUNTER — Ambulatory Visit
Admission: RE | Admit: 2016-04-12 | Discharge: 2016-04-12 | Disposition: A | Discharge status: Home / Self Care | Payer: BLUE CROSS/BLUE SHIELD | Source: Ambulatory Visit | Attending: Pain Medicine | Admitting: Pain Medicine

## 2016-04-12 VITALS — BP 106/53 | HR 80 | Temp 98.2°F | Resp 14 | Ht 62.0 in | Wt 127.0 lb

## 2016-04-12 DIAGNOSIS — E890 Postprocedural hypothyroidism: Secondary | ICD-10-CM | POA: Diagnosis not present

## 2016-04-12 DIAGNOSIS — Z96651 Presence of right artificial knee joint: Secondary | ICD-10-CM | POA: Insufficient documentation

## 2016-04-12 DIAGNOSIS — Z91010 Allergy to peanuts: Secondary | ICD-10-CM | POA: Insufficient documentation

## 2016-04-12 DIAGNOSIS — Z981 Arthrodesis status: Secondary | ICD-10-CM | POA: Diagnosis not present

## 2016-04-12 DIAGNOSIS — Z91040 Latex allergy status: Secondary | ICD-10-CM | POA: Insufficient documentation

## 2016-04-12 DIAGNOSIS — G90521 Complex regional pain syndrome I of right lower limb: Secondary | ICD-10-CM

## 2016-04-12 DIAGNOSIS — G8929 Other chronic pain: Secondary | ICD-10-CM

## 2016-04-12 DIAGNOSIS — Z881 Allergy status to other antibiotic agents status: Secondary | ICD-10-CM | POA: Insufficient documentation

## 2016-04-12 DIAGNOSIS — Z885 Allergy status to narcotic agent status: Secondary | ICD-10-CM | POA: Insufficient documentation

## 2016-04-12 DIAGNOSIS — M545 Low back pain: Secondary | ICD-10-CM | POA: Insufficient documentation

## 2016-04-12 DIAGNOSIS — M79604 Pain in right leg: Secondary | ICD-10-CM

## 2016-04-12 DIAGNOSIS — Z85820 Personal history of malignant melanoma of skin: Secondary | ICD-10-CM | POA: Insufficient documentation

## 2016-04-12 DIAGNOSIS — Z91048 Other nonmedicinal substance allergy status: Secondary | ICD-10-CM | POA: Insufficient documentation

## 2016-04-12 DIAGNOSIS — Z888 Allergy status to other drugs, medicaments and biological substances status: Secondary | ICD-10-CM | POA: Insufficient documentation

## 2016-04-12 DIAGNOSIS — Z8742 Personal history of other diseases of the female genital tract: Secondary | ICD-10-CM | POA: Diagnosis not present

## 2016-04-12 MED ORDER — SODIUM CHLORIDE 0.9 % IJ SOLN
INTRAMUSCULAR | Status: AC
  Filled 2016-04-12: qty 10

## 2016-04-12 MED ORDER — IOPAMIDOL (ISOVUE-M 200) INJECTION 41%
10.0000 mL | Freq: Once | INTRAMUSCULAR | Status: AC
  Administered 2016-04-12: 10 mL
  Filled 2016-04-12: qty 10

## 2016-04-12 MED ORDER — LIDOCAINE HCL (PF) 1 % IJ SOLN
10.0000 mL | Freq: Once | INTRAMUSCULAR | Status: AC
  Administered 2016-04-12: 10 mL
  Filled 2016-04-12: qty 10

## 2016-04-12 MED ORDER — DIPHENHYDRAMINE HCL 50 MG/ML IJ SOLN
12.5000 mg | INTRAMUSCULAR | Status: DC | PRN
  Administered 2016-04-12 (×2): 25 mg via INTRAVENOUS
  Filled 2016-04-12: qty 1

## 2016-04-12 MED ORDER — LACTATED RINGERS IV SOLN
1000.0000 mL | Freq: Once | INTRAVENOUS | Status: AC
  Administered 2016-04-12: 1000 mL via INTRAVENOUS

## 2016-04-12 MED ORDER — PROMETHAZINE HCL 25 MG/ML IJ SOLN
25.0000 mg | INTRAMUSCULAR | Status: DC | PRN
  Administered 2016-04-12: 25 mg via INTRAVENOUS
  Filled 2016-04-12 (×3): qty 1

## 2016-04-12 MED ORDER — FENTANYL CITRATE (PF) 100 MCG/2ML IJ SOLN
25.0000 ug | INTRAMUSCULAR | Status: DC | PRN
  Administered 2016-04-12: 100 ug via INTRAVENOUS
  Filled 2016-04-12: qty 2

## 2016-04-12 MED ORDER — DEXAMETHASONE SODIUM PHOSPHATE 4 MG/ML IJ SOLN
10.0000 mg | Freq: Once | INTRAMUSCULAR | Status: AC
  Administered 2016-04-12: 10 mg
  Filled 2016-04-12: qty 3

## 2016-04-12 MED ORDER — MIDAZOLAM HCL 5 MG/5ML IJ SOLN
1.0000 mg | INTRAMUSCULAR | Status: DC | PRN
  Administered 2016-04-12: 3 mg via INTRAVENOUS
  Filled 2016-04-12: qty 5

## 2016-04-12 MED ORDER — BUPIVACAINE-EPINEPHRINE 0.25% -1:200000 IJ SOLN
10.0000 mL | Freq: Once | INTRAMUSCULAR | Status: AC
  Administered 2016-04-12: 10 mL
  Filled 2016-04-12: qty 10

## 2016-04-12 NOTE — Patient Instructions (Addendum)
Steps to Quit Smoking Smoking tobacco can be harmful to your health and can affect almost every organ in your body. Smoking puts you, and those around you, at risk for developing many serious chronic diseases. Quitting smoking is difficult, but it is one of the best things that you can do for your health. It is never too late to quit. What are the benefits of quitting smoking? When you quit smoking, you lower your risk of developing serious diseases and conditions, such as:  Lung cancer or lung disease, such as COPD.  Heart disease.  Stroke.  Heart attack.  Infertility.  Osteoporosis and bone fractures. Additionally, symptoms such as coughing, wheezing, and shortness of breath may get better when you quit. You may also find that you get sick less often because your body is stronger at fighting off colds and infections. If you are pregnant, quitting smoking can help to reduce your chances of having a baby of low birth weight. How do I get ready to quit? When you decide to quit smoking, create a plan to make sure that you are successful. Before you quit:  Pick a date to quit. Set a date within the next two weeks to give you time to prepare.  Write down the reasons why you are quitting. Keep this list in places where you will see it often, such as on your bathroom mirror or in your car or wallet.  Identify the people, places, things, and activities that make you want to smoke (triggers) and avoid them. Make sure to take these actions:  Throw away all cigarettes at home, at work, and in your car.  Throw away smoking accessories, such as Scientist, research (medical).  Clean your car and make sure to empty the ashtray.  Clean your home, including curtains and carpets.  Tell your family, friends, and coworkers that you are quitting. Support from your loved ones can make quitting easier.  Talk with your health care provider about your options for quitting smoking.  Find out what treatment  options are covered by your health insurance. What strategies can I use to quit smoking? Talk with your healthcare provider about different strategies to quit smoking. Some strategies include:  Quitting smoking altogether instead of gradually lessening how much you smoke over a period of time. Research shows that quitting "cold [place]" is more successful than gradually quitting.  Attending in-person counseling to help you build problem-solving skills. You are more likely to have success in quitting if you attend several counseling sessions. Even short sessions of 10 minutes can be effective.  Finding resources and support systems that can help you to quit smoking and remain smoke-free after you quit. These resources are most helpful when you use them often. They can include:  Online chats with a Social worker.  Telephone quitlines.  Printed Furniture conservator/restorer.  Support groups or group counseling.  Text messaging programs.  Mobile phone applications.  Taking medicines to help you quit smoking. (If you are pregnant or breastfeeding, talk with your health care provider first.) Some medicines contain nicotine and some do not. Both types of medicines help with cravings, but the medicines that include nicotine help to relieve withdrawal symptoms. Your health care provider may recommend:  Nicotine patches, gum, or lozenges.  Nicotine inhalers or sprays.  Non-nicotine medicine that is taken by mouth. Talk with your health care provider about combining strategies, such as taking medicines while you are also receiving in-person counseling. Using these two strategies together makes you more  likely to succeed in quitting than if you used either strategy on its own. If you are pregnant or breastfeeding, talk with your health care provider about finding counseling or other support strategies to quit smoking. Do not take medicine to help you quit smoking unless told to do so by your health care  provider. What things can I do to make it easier to quit? Quitting smoking might feel overwhelming at first, but there is a lot that you can do to make it easier. Take these important actions:  Reach out to your family and friends and ask that they support and encourage you during this time. Call telephone quitlines, reach out to support groups, or work with a counselor for support.  Ask people who smoke to avoid smoking around you.  Avoid places that trigger you to smoke, such as bars, parties, or smoke-break areas at work.  Spend time around people who do not smoke.  Lessen stress in your life, because stress can be a smoking trigger for some people. To lessen stress, try:  Exercising regularly.  Deep-breathing exercises.  Yoga.  Meditating.  Performing a body scan. This involves closing your eyes, scanning your body from head to toe, and noticing which parts of your body are particularly tense. Purposefully relax the muscles in those areas.  Download or purchase mobile phone or tablet apps (applications) that can help you stick to your quit plan by providing reminders, tips, and encouragement. There are many free apps, such as QuitGuide from the [employer] Office manager for Disease Control and Prevention). You can find other support for quitting smoking (smoking cessation) through smokefree.gov and other websites. How will I feel when I quit smoking? Within the first 24 hours of quitting smoking, you may start to feel some withdrawal symptoms. These symptoms are usually most noticeable 2-3 days after quitting, but they usually do not last beyond 2-3 weeks. Changes or symptoms that you might experience include:  Mood swings.  Restlessness, anxiety, or irritation.  Difficulty concentrating.  Dizziness.  Strong cravings for sugary foods in addition to nicotine.  Mild weight gain.  Constipation.  Nausea.  Coughing or a sore throat.  Changes in how your medicines work in your  body.  A depressed mood.  Difficulty sleeping (insomnia). After the first 2-3 weeks of quitting, you may start to notice more positive results, such as:  Improved sense of smell and taste.  Decreased coughing and sore throat.  Slower heart rate.  Lower blood pressure.  Clearer skin.  The ability to breathe more easily.  Fewer sick days. Quitting smoking is very challenging for most people. Do not get discouraged if you are not successful the first time. Some people need to make many attempts to quit before they achieve long-term success. Do your best to stick to your quit plan, and talk with your health care provider if you have any questions or concerns. This information is not intended to replace advice given to you by your health care provider. Make sure you discuss any questions you have with your health care provider. Document Released: 02/02/2001 Document Revised: 10/07/2015 Document Reviewed: 06/25/2014 Elsevier Interactive Patient Education  2017 [place]. Radiofrequency Lesioning Introduction Radiofrequency lesioning is a procedure that is performed to relieve pain. The procedure is often used for back, neck, or arm pain. Radiofrequency lesioning involves the use of a machine that creates radio waves to make heat. During the procedure, the heat is applied to the nerve that carries the pain signal. The  heat damages the nerve and interferes with the pain signal. Pain relief usually starts about 2 weeks after the procedure and lasts for 6 months to 1 year. Tell a health care provider about:  Any allergies you have.  All medicines you are taking, including vitamins, herbs, eye drops, creams, and over-the-counter medicines.  Any problems you or family members have had with anesthetic medicines.  Any blood disorders you have.  Any surgeries you have had.  Any medical conditions you have.  Whether you are pregnant or may be pregnant. What are the risks? Generally,  this is a safe procedure. However, problems may occur, including:  Pain or soreness at the injection site.  Infection at the injection site.  Damage to nerves or blood vessels. What happens before the procedure?  Ask your health care provider about:  Changing or stopping your regular medicines. This is especially important if you are taking diabetes medicines or blood thinners.  Taking medicines such as aspirin and ibuprofen. These medicines can thin your blood. Do not take these medicines before your procedure if your health care provider instructs you not to.  Follow instructions from your health care provider about eating or drinking restrictions.  Plan to have someone take you home after the procedure.  If you go home right after the procedure, plan to have someone with you for 24 hours. What happens during the procedure?  You will be given one or more of the following:  A medicine to help you relax (sedative).  A medicine to numb the area (local anesthetic).  You will be awake during the procedure. You will need to be able to talk with the health care provider during the procedure.  With the help of a type of X-ray (fluoroscopy), the health care provider will insert a radiofrequency needle into the area to be treated.  Next, a wire that carries the radio waves (electrode) will be put through the radiofrequency needle. An electrical pulse will be sent through the electrode to verify the correct nerve. You will feel a tingling sensation, and you may have muscle twitching.  Then, the tissue that is around the needle tip will be heated by an electric current that is passed using the radiofrequency machine. This will numb the nerves.  A bandage (dressing) will be put on the insertion area after the procedure is done. The procedure may vary among health care providers and hospitals. What happens after the procedure?  Your blood pressure, heart rate, breathing rate, and blood  oxygen level will be monitored often until the medicines you were given have worn off.  Return to your normal activities as directed by your health care provider. This information is not intended to replace advice given to you by your health care provider. Make sure you discuss any questions you have with your health care provider. Document Released: 10/07/2010 Document Revised: 07/17/2015 Document Reviewed: 03/18/2014  2017 [employer] Radiofrequency Lesioning, Care After Introduction Refer to this sheet in the next few weeks. These instructions provide you with information about caring for yourself after your procedure. Your health care provider may also give you more specific instructions. Your treatment has been planned according to current medical practices, but problems sometimes occur. Call your health care provider if you have any problems or questions after your procedure. What can I expect after the procedure? After the procedure, it is common to have:  Pain from the burned nerve.  Temporary numbness. Follow these instructions at home:  Take over-the-counter and  prescription medicines only as told by your health care provider.  Return to your normal activities as told by your health care provider. Ask your health care provider what activities are safe for you.  Pay close attention to how you feel after the procedure. If you start to have pain, write down when it hurts and how it feels. This will help you and your health care provider to know if you need an additional treatment.  Check your needle insertion site every day for signs of infection. Watch for:  Redness, swelling, or pain.  Fluid, blood, or pus.  Keep all follow-up visits as told by your health care provider. This is important. Contact a health care provider if:  Your pain does not get better.  You have redness, swelling, or pain at the needle insertion site.  You have fluid, blood, or pus coming from the needle  insertion site.  You have a fever. Get help right away if:  You develop sudden, severe pain.  You develop numbness or tingling near the procedure site that does not go away. This information is not intended to replace advice given to you by your health care provider. Make sure you discuss any questions you have with your health care provider. Document Released: 10/08/2010 Document Revised: 07/17/2015 Document Reviewed: 03/18/2014  2017 [employer]

## 2016-04-12 NOTE — Progress Notes (Signed)
Safety precautions to be maintained throughout the outpatient stay will include: orient to surroundings, keep bed in low position, maintain call bell within reach at all times, provide assistance with transfer out of bed and ambulation.  

## 2016-04-12 NOTE — Progress Notes (Signed)
Patient's Name: Kari Green  MRN: [id number]  Referring Provider: Thressa Sheller, MD  DOB: [birth year]  PCP: Thressa Sheller, MD  DOS: 04/12/2016  Note by: Kathlen Brunswick. Dossie Arbour, MD  Service setting: Ambulatory outpatient  Location: [facility] (AMB) Pain Management Facility  Visit type: Procedure  Specialty: Interventional Pain Management  Patient type: Established   Primary Reason for Visit: Interventional Pain Management Treatment. CC: Back Pain (low)  Procedure:  Anesthesia, Analgesia, Anxiolysis:  Type: Palliative Lumbar Sympathetic Radiofrequency Ablation Region:Thoracolumbar Level: L3, & L4 Laterality: Right-Sided Paravertebral  Type: Local Anesthesia with Moderate (Conscious) Sedation Local Anesthetic: Lidocaine 1% Route: Intravenous (IV) IV Access: Secured Sedation: Meaningful verbal contact was maintained at all times during the procedure  Indication(s): Analgesia and Anxiety  Indications: 1. Complex regional pain syndrome type 1 of right lower extremity   2. Chronic lower extremity pain (Location of Primary Source of Pain) (Right)    Kari Green has either failed to respond, was unable to tolerate, or simply did not get enough benefit from other more conservative therapies including, but not limited to: 1. Over-the-counter medications 2. Anti-inflammatory medications 3. Muscle relaxants 4. Membrane stabilizers 5. Opioids 6. Physical therapy 7. Modalities (Heat, ice, etc.) 8. Invasive techniques such as nerve blocks. Kari Green has attained more than 50% relief of the pain from a series of diagnostic injections conducted in separate occasions.  Pain Score: Pre-procedure: 10-Worst pain ever/10 Post-procedure: 0-No pain/10 Initial right foot temperature: 80.7 F End right foot temperature: 93.9 F  Pre-op Assessment:  Previous date of service: 05/06/15 Service provided: Procedure (Lumbar sympathetic RF) Kari Green is a 51 y.o. (year old), female patient, seen  today for interventional treatment. She  has a past surgical history that includes Total thyroidectomy (1993); cysto with hydrodistension (01/29/2011); Cystoscopy w/ retrogrades (01/29/2011); Fissurectomy (~ 1988 x2); Refractive surgery (Bilateral, ~ 1999; ~ 2013); Nasal sinus surgery (2013); Laparoscopic lysis of adhesions (02/11/2012); Excision haglund's deformity with achilles tendon repair (Bilateral, 2013-2014); Melanoma excision (Left, 2013); Cystoscopy with stent placement (2006); Appendectomy (~ 1992); Tonsillectomy and adenoidectomy (~ 1989); LAPAROSCOPY ADHESIOLYSIS AND REMOVAL POSSIBLE RIGHT OVARY REMNANT (04/18/2000); Knee arthroscopy (Right, 08/16/2000); Carpal tunnel release (Right, 07/26/2001); DEBRIDEMENT RIGHT THUMB MP JOINT (Right, 04/10/2002); RIGHT THUMB FUSION OF MPJ (Right, 10/03/2002); DECOMPRESSION LEFT ULNAR NERVE, ELBOW (Left, 01/29/2003); RIGHT TOTAL KNEE REVISION ARTHROPLASTY (Right, 03/14/2007); CYSTO/ URETHRAL DILATION/ HYDRODISTENTION/ BLADDER BX (04/10/2003); Laparoscopic assisted vaginal hysterectomy (Feb 1999); LAPAROSCOPY LYSIS ADHESIONS/  BILATERAL SALPINGOOPHORECTOMY/  FULGERATION OF ENDOMETRIOSIS (1998); Anterior cervical decomp/discectomy fusion (03/10/2009); Esophagogastroduodenoscopy (egd) with esophageal dilation (10-26-2000); CYSTO/  BLADDER BX (03/08/2008); transthoracic echocardiogram (09/22/2012); cysto with hydrodistension (N/A, 05/08/2014); laparoscopy (02/11/2012); Transanal hemorrhoidal dearterialization (03/26/2011); Knee arthroscopy with fulkerson slide (Right, 11/29/2000); Knee arthroscopy w/ lateral release (Right, 11/29/2000); Laparoscopic cholecystectomy (03/04/2002); Total knee arthroplasty (Right, 09/10/2003; 12/08/2012); Carpal tunnel release (Left); Melanoma excision; Carpometacarpel (CMC) suspension plasty (Right, 02/10/2015); Ganglion cyst excision (Right, 02/10/2015); NEGATIVE SLEEP STUDY (2006 approx .  per pt); cysto with hydrodistension (N/A, 03/27/2015); Hand surgery  (Right, 01-31-15); Abdominal hysterectomy; Hemorroidectomy; Kidney stone surgery; Shoulder surgery (Left); Anterior cervical decomp/discectomy fusion (N/A, 07/16/2015); Video bronchoscopy (Bilateral, 10/20/2015); Augmentation mammaplasty (Bilateral, 1996; 2001, 2016); Breast surgery (1980's); Eye surgery (Bilateral); Colonoscopy w/ polypectomy; and Shoulder hemi-arthroplasty (Left, 12/04/2015). Her primarily concern today is the Back Pain (low)  Initial Vital Signs: Blood pressure 110/64, pulse 90, temperature 98.2 F (36.8 C), resp. rate 20, height 5\' 2"  (1.575 m), weight 127 lb (57.6 kg), SpO2 99 %. BMI: 23.23 kg/m  Risk Assessment: Allergies: Reviewed. She  is allergic to cobalt; latex; nickel; peanuts [peanut oil]; red dye; azithromycin; chloraprep one step [chlorhexidine gluconate]; corticosteroids; flexeril [cyclobenzaprine]; morphine and related; other; percocet [oxycodone-acetaminophen]; supartz [sodium hyaluronate]; surgical lubricant; yellow dyes (non-tartrazine); prednisone; soap; and tape.  Allergy Precautions: Latex-free protocol activated. Precautions taken also with the surgical prep due to her allergy to ChloraPrep. Coagulopathies: "Reviewed. None identified.  Blood-thinner therapy: None at this time Active Infection(s): Reviewed. None identified. Kari Green is afebrile  Site Confirmation: Kari Green was asked to confirm the procedure and laterality before marking the site Procedure checklist: Completed Consent: Before the procedure and under the influence of no sedative(s), amnesic(s), or anxiolytics, the patient was informed of the treatment options, risks and possible complications. To fulfill our ethical and legal obligations, as recommended by the American Medical Association's Code of Ethics, I have informed the patient of my clinical impression; the nature and purpose of the treatment or procedure; the risks, benefits, and possible complications of the intervention; the  alternatives, including doing nothing; the risk(s) and benefit(s) of the alternative treatment(s) or procedure(s); and the risk(s) and benefit(s) of doing nothing. The patient was provided information about the general risks and possible complications associated with the procedure. These may include, but are not limited to: failure to achieve desired goals, infection, bleeding, organ or nerve damage, allergic reactions, paralysis, and death. In addition, the patient was informed of those risks and complications associated to Spine-related procedures, such as failure to decrease pain; infection (i.e.: Meningitis, epidural or intraspinal abscess); bleeding (i.e.: epidural hematoma, subarachnoid hemorrhage, or any other type of intraspinal or peri-dural bleeding); organ or nerve damage (i.e.: Any type of peripheral nerve, nerve root, or spinal cord injury) with subsequent damage to sensory, motor, and/or autonomic systems, resulting in permanent pain, numbness, and/or weakness of one or several areas of the body; allergic reactions; (i.e.: anaphylactic reaction); and/or death. Furthermore, the patient was informed of those risks and complications associated with the medications. These include, but are not limited to: allergic reactions (i.e.: anaphylactic or anaphylactoid reaction(s)); adrenal axis suppression; blood sugar elevation that in diabetics may result in ketoacidosis or comma; water retention that in patients with history of congestive heart failure may result in shortness of breath, pulmonary edema, and decompensation with resultant heart failure; weight gain; swelling or edema; medication-induced neural toxicity; particulate matter embolism and blood vessel occlusion with resultant organ, and/or nervous system infarction; and/or aseptic necrosis of one or more joints. Finally, the patient was informed that Medicine is not an exact science; therefore, there is also the possibility of unforeseen or  unpredictable risks and/or possible complications that may result in a catastrophic outcome. The patient indicated having understood very clearly. We have given the patient no guarantees and we have made no promises. Enough time was given to the patient to ask questions, all of which were answered to the patient's satisfaction. Kari Green has indicated that she wanted to continue with the procedure. Attestation: I, the ordering provider, attest that I have discussed with the patient the benefits, risks, side-effects, alternatives, likelihood of achieving goals, and potential problems during recovery for the procedure that I have provided informed consent. Date: 04/12/2016; Time: 11:54 AM  Pre-Procedure Preparation:  Monitoring: As per clinic protocol. Respiration, ETCO2, SpO2, BP, heart rate and rhythm monitor placed and checked for adequate function Safety Precautions: Patient was assessed for positional comfort and pressure points before starting the procedure. Time-out: I initiated and conducted the "Time-out" before starting the procedure, as per protocol. The patient  was asked to participate by confirming the accuracy of the "Time Out" information. Verification of the correct person, site, and procedure were performed and confirmed by me, the nursing staff, and the patient. "Time-out" conducted as per Joint Commission's Universal Protocol (UP.01.01.01). "Time-out" Date & Time: 04/12/2016; 1337 hrs.  Description of Procedure Process:   Position: Prone with head of the table was raised to facilitate breathing. Target Area: For Lumbar Sympathetic Block(s), the target is the anterolateral aspect of the L3 & L4 vertebral bodies, where the lumbar sympathetic chain resides. Approach: Paravertebral, ipsilateral approach. Area Prepped: Entire Posterior Thoracolumbar Region Prepping solution: Hibiclens (4.0% Chlorhexidine gluconate solution) Safety Precautions: Aspiration looking for blood return was  conducted prior to all injections. At no point did we inject any substances, as a needle was being advanced. No attempts were made at seeking any paresthesias. Safe injection practices and needle disposal techniques used. Medications properly checked for expiration dates. SDV (single dose vial) medications used. Description of the Procedure: Protocol guidelines were followed. The patient was placed in position over the procedure table. The target area was identified and the area prepped in the usual manner. Skin & deeper tissues infiltrated with local anesthetic. Appropriate amount of time allowed to pass for local anesthetics to take effect. The Radiofrequency needles were introduced ipsilateral to the affected side, aiming at the anterolateral aspect of the L3 & L4 vertebral bodies, where the Lumbar Sympathetic Chain is located. Using the Radiofrequency Generator, sensory stimulation using 50 Hz was used to locate & identify the nerve, making sure that the needle was positioned such that there was no sensory stimulation to the groin area at any output, or sensory stimulation below 0.3 V or above 0.7 V for the lower extremity. Stimulation using 2 Hz was used to evaluate the motor component. Care was taken not to lesion any nerves that demonstrated motor stimulation of the lower extremities. Once satisfactory placement of the needles was achieved, the above solution was slowly injected after negative aspiration. After waiting for at least 2 minutes, the ablation was performed at 80 degrees C for 60 seconds. The needles were then removed and the area cleansed, making sure to leave some of the prepping solution back to take advantage of its long term bactericidal properties. Vitals:   04/12/16 1418 04/12/16 1423 04/12/16 1433 04/12/16 1443  BP: 118/66 97/60 124/63 (!) 106/53  Pulse: 86 83 79 80  Resp: 14 14 14 14   Temp:      SpO2: 99% 96% 98% 97%  Weight:      Height:        Start Time: 1339 hrs. End  Time: 1409 hrs. Materials & Medications:  Needle(s) Type: Teflon-coated, curved tip, Radiofrequency needle(s) Gauge: 20G Length: 15cm Medication(s): We administered fentaNYL, lactated ringers, midazolam, bupivacaine-EPINEPHrine, dexamethasone, iopamidol, lidocaine (PF), diphenhydrAMINE, and promethazine. Please see chart orders for dosing details.  Imaging Guidance (Spinal):  Type of Imaging Technique: Fluoroscopy Guidance (Spinal) Indication(s): Assistance in needle guidance and placement for procedures requiring needle placement in or near specific anatomical locations not easily accessible without such assistance. Exposure Time: Please see nurses notes. Contrast: Before injecting any contrast, we confirmed that the patient did not have an allergy to iodine, shellfish, or radiological contrast. Once satisfactory needle placement was completed at the desired level, radiological contrast was injected. Contrast injected under live fluoroscopy. No contrast complications. See chart for type and volume of contrast used. Fluoroscopic Guidance: I was personally present during the use of fluoroscopy. "Tunnel Vision Technique"  used to obtain the best possible view of the target area. Parallax error corrected before commencing the procedure. "Direction-depth-direction" technique used to introduce the needle under continuous pulsed fluoroscopy. Once target was reached, antero-posterior, oblique, and lateral fluoroscopic projection used confirm needle placement in all planes. Images permanently stored in EMR. Interpretation: I personally interpreted the imaging intraoperatively. Adequate needle placement confirmed in multiple planes. Appropriate spread of contrast into desired area was observed. No evidence of afferent or efferent intravascular uptake. No intrathecal or subarachnoid spread observed. Permanent images saved into the patient's record.  Antibiotic Prophylaxis:  Indication(s): None  identified Antibiotic given: None  Post-operative Assessment:  EBL: None Complications: No immediate post-treatment complications observed by team, or reported by patient. Note: The patient tolerated the entire procedure well. A repeat set of vitals were taken after the procedure and the patient was kept under observation following institutional policy, for this type of procedure. Post-procedural neurological assessment was performed, showing return to baseline, prior to discharge. The patient was provided with post-procedure discharge instructions, including a section on how to identify potential problems. Should any problems arise concerning this procedure, the patient was given instructions to immediately contact us, at any time, without hesitation. In any case, we plan to contact the patient by telephone for a follow-up status report regarding this interventional procedure. Comments:  No additional relevant information.  Plan of Care  Disposition: Discharge home  Discharge Date & Time: 04/12/2016; 1447 (temp of rigth foot discharge 94.0) hrs.  Physician-requested Follow-up:  Return in about 6 weeks (around 05/24/2016) for Post-Procedure evaluation.  Future Appointments Date Time Provider [place]  04/30/2016 8:30 AM Pieter Partridge, DO [facility] None  05/24/2016 1:00 PM Milinda Pointer, MD [facility] None   Medications ordered for procedure: Meds ordered this encounter  Medications  . fentaNYL (SUBLIMAZE) injection 25-50 mcg    Make sure Narcan is available in the pyxis when using this medication. In the event of respiratory depression (RR< 8/min): Titrate NARCAN (naloxone) in increments of 0.1 to 0.2 mg IV at 2-3 minute intervals, until desired degree of reversal.  . lactated ringers infusion 1,000 mL  . midazolam (VERSED) 5 MG/5ML injection 1-2 mg    Make sure Flumazenil is available in the pyxis when using this medication. If oversedation occurs, administer 0.2 mg IV over 15 sec.  If after 45 sec no response, administer 0.2 mg again over 1 min; may repeat at 1 min intervals; not to exceed 4 doses (1 mg)  . bupivacaine-EPINEPHrine (MARCAINE W/ EPI) 0.25% -1:200000 (with pres) injection 10 mL  . dexamethasone (DECADRON) injection 10 mg  . iopamidol (ISOVUE-M) 41 % intrathecal injection 10 mL  . lidocaine (PF) (XYLOCAINE) 1 % injection 10 mL  . diphenhydrAMINE (BENADRYL) injection 12.5 mg  . promethazine (PHENERGAN) injection 25 mg   Medications administered: We administered fentaNYL, lactated ringers, midazolam, bupivacaine-EPINEPHrine, dexamethasone, iopamidol, lidocaine (PF), diphenhydrAMINE, and promethazine.  See the medical record for exact dosing, route, and time of administration.  Lab-work, Procedure(s), & Referral(s) Ordered: Orders Placed This Encounter  Procedures  . DG C-Arm 1-60 Min-No Report  . Discharge instructions  . Follow-up  . Informed Consent Details: Transcribe to consent form and obtain patient signature  . Provider attestation of informed consent for procedure/surgical case  . Verify informed consent   Imaging Ordered: No results found for this or any previous visit. New Prescriptions   No medications on file   Primary Care Physician: Thressa Sheller, MD Location: [place]  Note by: Maddax Palinkas A. Dossie Arbour, M.D, DABA, DABAPM, DABPM, DABIPP, FIPP Date: 04/12/2016; Time: 4:00 PM  Disclaimer:  Medicine is not an Chief Strategy Officer. The only guarantee in medicine is that nothing is guaranteed. It is important to note that the decision to proceed with this intervention was based on the information collected from the patient. The Data and conclusions were drawn from the patient's questionnaire, the interview, and the physical examination. Because the information was provided in large part by the patient, it cannot be guaranteed that it has not been purposely or unconsciously manipulated. Every effort has been made to obtain  as much relevant data as possible for this evaluation. It is important to note that the conclusions that lead to this procedure are derived in large part from the available data. Always take into account that the treatment will also be dependent on availability of resources and existing treatment guidelines, considered by other Pain Management Practitioners as being common knowledge and practice, at the time of the intervention. For Medico-Legal purposes, it is also important to point out that variation in procedural techniques and pharmacological choices are the acceptable norm. The indications, contraindications, technique, and results of the above procedure should only be interpreted and judged by a Board-Certified Interventional Pain Specialist with extensive familiarity and expertise in the same exact procedure and technique. Attempts at providing opinions without similar or greater experience and expertise than that of the treating physician will be considered as inappropriate and unethical, and shall result in a formal complaint to the state medical board and applicable specialty societies.  Instructions provided at this appointment: Patient Instructions  Steps to Quit Smoking Smoking tobacco can be harmful to your health and can affect almost every organ in your body. Smoking puts you, and those around you, at risk for developing many serious chronic diseases. Quitting smoking is difficult, but it is one of the best things that you can do for your health. It is never too late to quit. What are the benefits of quitting smoking? When you quit smoking, you lower your risk of developing serious diseases and conditions, such as:  Lung cancer or lung disease, such as COPD.  Heart disease.  Stroke.  Heart attack.  Infertility.  Osteoporosis and bone fractures. Additionally, symptoms such as coughing, wheezing, and shortness of breath may get better when you quit. You may also find that you get  sick less often because your body is stronger at fighting off colds and infections. If you are pregnant, quitting smoking can help to reduce your chances of having a baby of low birth weight. How do I get ready to quit? When you decide to quit smoking, create a plan to make sure that you are successful. Before you quit:  Pick a date to quit. Set a date within the next two weeks to give you time to prepare.  Write down the reasons why you are quitting. Keep this list in places where you will see it often, such as on your bathroom mirror or in your car or wallet.  Identify the people, places, things, and activities that make you want to smoke (triggers) and avoid them. Make sure to take these actions:  Throw away all cigarettes at home, at work, and in your car.  Throw away smoking accessories, such as Scientist, research (medical).  Clean your car and make sure to empty the ashtray.  Clean your home, including curtains and carpets.  Tell your family, friends, and coworkers that you are quitting.  Support from your loved ones can make quitting easier.  Talk with your health care provider about your options for quitting smoking.  Find out what treatment options are covered by your health insurance. What strategies can I use to quit smoking? Talk with your healthcare provider about different strategies to quit smoking. Some strategies include:  Quitting smoking altogether instead of gradually lessening how much you smoke over a period of time. Research shows that quitting "cold [place]" is more successful than gradually quitting.  Attending in-person counseling to help you build problem-solving skills. You are more likely to have success in quitting if you attend several counseling sessions. Even short sessions of 10 minutes can be effective.  Finding resources and support systems that can help you to quit smoking and remain smoke-free after you quit. These resources are most helpful when you use  them often. They can include:  Online chats with a Social worker.  Telephone quitlines.  Printed Furniture conservator/restorer.  Support groups or group counseling.  Text messaging programs.  Mobile phone applications.  Taking medicines to help you quit smoking. (If you are pregnant or breastfeeding, talk with your health care provider first.) Some medicines contain nicotine and some do not. Both types of medicines help with cravings, but the medicines that include nicotine help to relieve withdrawal symptoms. Your health care provider may recommend:  Nicotine patches, gum, or lozenges.  Nicotine inhalers or sprays.  Non-nicotine medicine that is taken by mouth. Talk with your health care provider about combining strategies, such as taking medicines while you are also receiving in-person counseling. Using these two strategies together makes you more likely to succeed in quitting than if you used either strategy on its own. If you are pregnant or breastfeeding, talk with your health care provider about finding counseling or other support strategies to quit smoking. Do not take medicine to help you quit smoking unless told to do so by your health care provider. What things can I do to make it easier to quit? Quitting smoking might feel overwhelming at first, but there is a lot that you can do to make it easier. Take these important actions:  Reach out to your family and friends and ask that they support and encourage you during this time. Call telephone quitlines, reach out to support groups, or work with a counselor for support.  Ask people who smoke to avoid smoking around you.  Avoid places that trigger you to smoke, such as bars, parties, or smoke-break areas at work.  Spend time around people who do not smoke.  Lessen stress in your life, because stress can be a smoking trigger for some people. To lessen stress, try:  Exercising regularly.  Deep-breathing  exercises.  Yoga.  Meditating.  Performing a body scan. This involves closing your eyes, scanning your body from head to toe, and noticing which parts of your body are particularly tense. Purposefully relax the muscles in those areas.  Download or purchase mobile phone or tablet apps (applications) that can help you stick to your quit plan by providing reminders, tips, and encouragement. There are many free apps, such as QuitGuide from the [employer] Office manager for Disease Control and Prevention). You can find other support for quitting smoking (smoking cessation) through smokefree.gov and other websites. How will I feel when I quit smoking? Within the first 24 hours of quitting smoking, you may start to feel some withdrawal symptoms. These symptoms are usually most noticeable 2-3 days after quitting, but they usually do not  last beyond 2-3 weeks. Changes or symptoms that you might experience include:  Mood swings.  Restlessness, anxiety, or irritation.  Difficulty concentrating.  Dizziness.  Strong cravings for sugary foods in addition to nicotine.  Mild weight gain.  Constipation.  Nausea.  Coughing or a sore throat.  Changes in how your medicines work in your body.  A depressed mood.  Difficulty sleeping (insomnia). After the first 2-3 weeks of quitting, you may start to notice more positive results, such as:  Improved sense of smell and taste.  Decreased coughing and sore throat.  Slower heart rate.  Lower blood pressure.  Clearer skin.  The ability to breathe more easily.  Fewer sick days. Quitting smoking is very challenging for most people. Do not get discouraged if you are not successful the first time. Some people need to make many attempts to quit before they achieve long-term success. Do your best to stick to your quit plan, and talk with your health care provider if you have any questions or concerns. This information is not intended to replace advice given to  you by your health care provider. Make sure you discuss any questions you have with your health care provider. Document Released: 02/02/2001 Document Revised: 10/07/2015 Document Reviewed: 06/25/2014 Elsevier Interactive Patient Education  2017 [employer]. Radiofrequency Lesioning Introduction Radiofrequency lesioning is a procedure that is performed to relieve pain. The procedure is often used for back, neck, or arm pain. Radiofrequency lesioning involves the use of a machine that creates radio waves to make heat. During the procedure, the heat is applied to the nerve that carries the pain signal. The heat damages the nerve and interferes with the pain signal. Pain relief usually starts about 2 weeks after the procedure and lasts for 6 months to 1 year. Tell a health care provider about:  Any allergies you have.  All medicines you are taking, including vitamins, herbs, eye drops, creams, and over-the-counter medicines.  Any problems you or family members have had with anesthetic medicines.  Any blood disorders you have.  Any surgeries you have had.  Any medical conditions you have.  Whether you are pregnant or may be pregnant. What are the risks? Generally, this is a safe procedure. However, problems may occur, including:  Pain or soreness at the injection site.  Infection at the injection site.  Damage to nerves or blood vessels. What happens before the procedure?  Ask your health care provider about:  Changing or stopping your regular medicines. This is especially important if you are taking diabetes medicines or blood thinners.  Taking medicines such as aspirin and ibuprofen. These medicines can thin your blood. Do not take these medicines before your procedure if your health care provider instructs you not to.  Follow instructions from your health care provider about eating or drinking restrictions.  Plan to have someone take you home after the procedure.  If you go  home right after the procedure, plan to have someone with you for 24 hours. What happens during the procedure?  You will be given one or more of the following:  A medicine to help you relax (sedative).  A medicine to numb the area (local anesthetic).  You will be awake during the procedure. You will need to be able to talk with the health care provider during the procedure.  With the help of a type of X-ray (fluoroscopy), the health care provider will insert a radiofrequency needle into the area to be treated.  Next, a wire  that carries the radio waves (electrode) will be put through the radiofrequency needle. An electrical pulse will be sent through the electrode to verify the correct nerve. You will feel a tingling sensation, and you may have muscle twitching.  Then, the tissue that is around the needle tip will be heated by an electric current that is passed using the radiofrequency machine. This will numb the nerves.  A bandage (dressing) will be put on the insertion area after the procedure is done. The procedure may vary among health care providers and hospitals. What happens after the procedure?  Your blood pressure, heart rate, breathing rate, and blood oxygen level will be monitored often until the medicines you were given have worn off.  Return to your normal activities as directed by your health care provider. This information is not intended to replace advice given to you by your health care provider. Make sure you discuss any questions you have with your health care provider. Document Released: 10/07/2010 Document Revised: 07/17/2015 Document Reviewed: 03/18/2014  2017 Elsevier Radiofrequency Lesioning, Care After Introduction Refer to this sheet in the next few weeks. These instructions provide you with information about caring for yourself after your procedure. Your health care provider may also give you more specific instructions. Your treatment has been planned  according to current medical practices, but problems sometimes occur. Call your health care provider if you have any problems or questions after your procedure. What can I expect after the procedure? After the procedure, it is common to have:  Pain from the burned nerve.  Temporary numbness. Follow these instructions at home:  Take over-the-counter and prescription medicines only as told by your health care provider.  Return to your normal activities as told by your health care provider. Ask your health care provider what activities are safe for you.  Pay close attention to how you feel after the procedure. If you start to have pain, write down when it hurts and how it feels. This will help you and your health care provider to know if you need an additional treatment.  Check your needle insertion site every day for signs of infection. Watch for:  Redness, swelling, or pain.  Fluid, blood, or pus.  Keep all follow-up visits as told by your health care provider. This is important. Contact a health care provider if:  Your pain does not get better.  You have redness, swelling, or pain at the needle insertion site.  You have fluid, blood, or pus coming from the needle insertion site.  You have a fever. Get help right away if:  You develop sudden, severe pain.  You develop numbness or tingling near the procedure site that does not go away. This information is not intended to replace advice given to you by your health care provider. Make sure you discuss any questions you have with your health care provider. Document Released: 10/08/2010 Document Revised: 07/17/2015 Document Reviewed: 03/18/2014  2017 Elsevier

## 2016-04-13 ENCOUNTER — Telehealth: Payer: Self-pay | Admitting: *Deleted

## 2016-04-13 NOTE — Telephone Encounter (Signed)
Attempted to call patient for post procedure follow-up. Message left. 

## 2016-04-21 NOTE — Addendum Note (Signed)
Addended by: Orpah Greek D on: 04/21/2016 04:42 PM   Modules accepted: Orders

## 2016-04-22 DIAGNOSIS — M47818 Spondylosis without myelopathy or radiculopathy, sacral and sacrococcygeal region: Secondary | ICD-10-CM | POA: Diagnosis not present

## 2016-04-22 DIAGNOSIS — M47816 Spondylosis without myelopathy or radiculopathy, lumbar region: Secondary | ICD-10-CM | POA: Diagnosis not present

## 2016-04-22 DIAGNOSIS — M47817 Spondylosis without myelopathy or radiculopathy, lumbosacral region: Secondary | ICD-10-CM | POA: Diagnosis not present

## 2016-04-23 ENCOUNTER — Encounter (HOSPITAL_COMMUNITY)
Admission: RE | Disposition: A | Discharge status: Home / Self Care | Payer: Self-pay | Source: Ambulatory Visit | Attending: Gastroenterology

## 2016-04-23 ENCOUNTER — Ambulatory Visit (HOSPITAL_COMMUNITY): Payer: BLUE CROSS/BLUE SHIELD | Admitting: Anesthesiology

## 2016-04-23 ENCOUNTER — Encounter (HOSPITAL_COMMUNITY): Payer: Self-pay

## 2016-04-23 ENCOUNTER — Ambulatory Visit (HOSPITAL_COMMUNITY)
Admission: RE | Admit: 2016-04-23 | Discharge: 2016-04-23 | Disposition: A | Discharge status: Home / Self Care | Payer: BLUE CROSS/BLUE SHIELD | Source: Ambulatory Visit | Attending: Gastroenterology | Admitting: Gastroenterology

## 2016-04-23 DIAGNOSIS — G43909 Migraine, unspecified, not intractable, without status migrainosus: Secondary | ICD-10-CM | POA: Diagnosis not present

## 2016-04-23 DIAGNOSIS — J45909 Unspecified asthma, uncomplicated: Secondary | ICD-10-CM | POA: Insufficient documentation

## 2016-04-23 DIAGNOSIS — Z885 Allergy status to narcotic agent status: Secondary | ICD-10-CM | POA: Insufficient documentation

## 2016-04-23 DIAGNOSIS — J309 Allergic rhinitis, unspecified: Secondary | ICD-10-CM | POA: Diagnosis not present

## 2016-04-23 DIAGNOSIS — R935 Abnormal findings on diagnostic imaging of other abdominal regions, including retroperitoneum: Secondary | ICD-10-CM | POA: Diagnosis not present

## 2016-04-23 DIAGNOSIS — K589 Irritable bowel syndrome without diarrhea: Secondary | ICD-10-CM | POA: Insufficient documentation

## 2016-04-23 DIAGNOSIS — M810 Age-related osteoporosis without current pathological fracture: Secondary | ICD-10-CM | POA: Diagnosis not present

## 2016-04-23 DIAGNOSIS — G8929 Other chronic pain: Secondary | ICD-10-CM

## 2016-04-23 DIAGNOSIS — M79604 Pain in right leg: Secondary | ICD-10-CM

## 2016-04-23 DIAGNOSIS — K859 Acute pancreatitis without necrosis or infection, unspecified: Secondary | ICD-10-CM | POA: Diagnosis not present

## 2016-04-23 DIAGNOSIS — N3010 Interstitial cystitis (chronic) without hematuria: Secondary | ICD-10-CM | POA: Diagnosis not present

## 2016-04-23 DIAGNOSIS — M797 Fibromyalgia: Secondary | ICD-10-CM | POA: Insufficient documentation

## 2016-04-23 DIAGNOSIS — R933 Abnormal findings on diagnostic imaging of other parts of digestive tract: Secondary | ICD-10-CM | POA: Diagnosis not present

## 2016-04-23 DIAGNOSIS — F17200 Nicotine dependence, unspecified, uncomplicated: Secondary | ICD-10-CM | POA: Diagnosis not present

## 2016-04-23 DIAGNOSIS — M1990 Unspecified osteoarthritis, unspecified site: Secondary | ICD-10-CM | POA: Diagnosis not present

## 2016-04-23 DIAGNOSIS — Z85828 Personal history of other malignant neoplasm of skin: Secondary | ICD-10-CM | POA: Insufficient documentation

## 2016-04-23 DIAGNOSIS — F419 Anxiety disorder, unspecified: Secondary | ICD-10-CM | POA: Diagnosis not present

## 2016-04-23 DIAGNOSIS — I10 Essential (primary) hypertension: Secondary | ICD-10-CM | POA: Insufficient documentation

## 2016-04-23 DIAGNOSIS — R1013 Epigastric pain: Secondary | ICD-10-CM | POA: Diagnosis not present

## 2016-04-23 DIAGNOSIS — E039 Hypothyroidism, unspecified: Secondary | ICD-10-CM | POA: Insufficient documentation

## 2016-04-23 DIAGNOSIS — G90521 Complex regional pain syndrome I of right lower limb: Secondary | ICD-10-CM

## 2016-04-23 HISTORY — PX: EUS: SHX5427

## 2016-04-23 SURGERY — UPPER ENDOSCOPIC ULTRASOUND (EUS) LINEAR
Anesthesia: Monitor Anesthesia Care

## 2016-04-23 MED ORDER — MIDAZOLAM HCL 2 MG/2ML IJ SOLN
INTRAMUSCULAR | Status: AC
  Filled 2016-04-23: qty 2

## 2016-04-23 MED ORDER — SODIUM CHLORIDE 0.9 % IV SOLN
Freq: Once | INTRAVENOUS | Status: DC
  Filled 2016-04-23: qty 50

## 2016-04-23 MED ORDER — DIPHENHYDRAMINE HCL 50 MG/ML IJ SOLN
INTRAMUSCULAR | Status: DC | PRN
  Administered 2016-04-23: 25 mg via INTRAVENOUS

## 2016-04-23 MED ORDER — DIPHENHYDRAMINE HCL 50 MG/ML IJ SOLN
50.0000 mg | INTRAMUSCULAR | Status: DC | PRN
Start: 2016-04-23 — End: 2016-04-23

## 2016-04-23 MED ORDER — LACTATED RINGERS IV SOLN
INTRAVENOUS | Status: DC | PRN
  Administered 2016-04-23: 12:00:00 via INTRAVENOUS

## 2016-04-23 MED ORDER — LIDOCAINE 2% (20 MG/ML) 5 ML SYRINGE
INTRAMUSCULAR | Status: AC
  Filled 2016-04-23: qty 5

## 2016-04-23 MED ORDER — PROPOFOL 10 MG/ML IV BOLUS
INTRAVENOUS | Status: AC
  Filled 2016-04-23: qty 20

## 2016-04-23 MED ORDER — SODIUM CHLORIDE 0.9 % IV SOLN: INTRAVENOUS | Status: DC

## 2016-04-23 MED ORDER — CEFAZOLIN SODIUM-DEXTROSE 2-4 GM/100ML-% IV SOLN
2.0000 g | INTRAVENOUS | Status: AC
  Administered 2016-04-23: 2 g via INTRAVENOUS
  Filled 2016-04-23 (×3): qty 100

## 2016-04-23 MED ORDER — PROMETHAZINE HCL 25 MG/ML IJ SOLN: 50.0000 mg | Freq: Once | INTRAMUSCULAR | Status: DC

## 2016-04-23 MED ORDER — PROPOFOL 10 MG/ML IV BOLUS
INTRAVENOUS | Status: DC | PRN
  Administered 2016-04-23 (×5): 50 mg via INTRAVENOUS

## 2016-04-23 MED ORDER — PROMETHAZINE HCL 25 MG/ML IJ SOLN: 25.0000 mg | Freq: Once | INTRAMUSCULAR | Status: DC

## 2016-04-23 NOTE — Discharge Instructions (Signed)

## 2016-04-23 NOTE — Op Note (Signed)
[facility] Patient Name: Kari Green Procedure Date: 04/23/2016 MRN: [id number] Attending MD: Carol Ada , MD Date of Birth: 18-Apr-1965 CSN: [id number] Age: 51 Admit Type: Outpatient Procedure:                Upper EUS Indications:              Abnormal ultrasound of the abdomen Providers:                Carol Ada, MD, Laverta Baltimore RN, RN, Cherylynn Ridges, Technician, [facility], CRNA Referring MD:              Medicines:                Propofol per Anesthesia Complications:            No immediate complications. Estimated Blood Loss:     Estimated blood loss: none. Procedure:                Pre-Anesthesia Assessment:                           - Prior to the procedure, a History and Physical                            was performed, and patient medications and                            allergies were reviewed. The patient's tolerance of                            previous anesthesia was also reviewed. The risks                            and benefits of the procedure and the sedation                            options and risks were discussed with the patient.                            All questions were answered, and informed consent                            was obtained. Prior Anticoagulants: The patient has                            taken no previous anticoagulant or antiplatelet                            agents. ASA Grade Assessment: III - A patient with                            severe systemic disease. After reviewing the risks  and benefits, the patient was deemed in                            satisfactory condition to undergo the procedure.                           - Sedation was administered by an anesthesia                            professional. Deep sedation was attained.                           After obtaining informed consent, the endoscope was   passed under direct vision. Throughout the                            procedure, the patient's blood pressure, pulse, and                            oxygen saturations were monitored continuously. The                            [id number]) scope was introduced through                            the mouth, and advanced to the duodenal bulb. The                            upper EUS was accomplished without difficulty. The                            patient tolerated the procedure well. Scope In: Scope Out: Findings:      Endosonographic Finding :      Visualized portions of the left lobe of the liver, common bile duct and       pancreas were normal on ultrasound examination. No abnormal-appearing       lymph nodes were identified in the mediastinum or abdomen.      There was no finding of the hyperechoic lesion in the head of the       pancreas to correlate with the transabdominal ultrasound. Impression:               - Normal pancreas.                           - S/p cholecholecystectomy. Moderate Sedation:      N/A- Per Anesthesia Care Recommendation:           - Patient has a contact number available for                            emergencies. The signs and symptoms of potential                            delayed complications were discussed with the  patient. Return to normal activities tomorrow.                            Written discharge instructions were provided to the                            patient.                           - Resume regular diet.                           - Continue present medications. Procedure Code(s):        --- Professional ---                           [phone], Esophagogastroduodenoscopy, flexible,                            transoral; with endoscopic ultrasound examination,                            including the esophagus, stomach, and either the                            duodenum or a surgically altered stomach  where the                            jejunum is examined distal to the anastomosis Diagnosis Code(s):        --- Professional ---                           R93.5, Abnormal findings on diagnostic imaging of                            other abdominal regions, including retroperitoneum CPT copyright 2016 American Medical Association. All rights reserved. The codes documented in this report are preliminary and upon coder review may  be revised to meet current compliance requirements. Carol Ada, MD Carol Ada, MD 04/23/2016 12:30:12 PM This report has been signed electronically. Number of Addenda: 0

## 2016-04-23 NOTE — Anesthesia Preprocedure Evaluation (Addendum)
Anesthesia Evaluation  Patient identified by MRN, date of birth, ID band Patient awake    Reviewed: Allergy & Precautions, NPO status , Patient's Chart, lab work & pertinent test results  Airway Mallampati: II  TM Distance: >3 FB Neck ROM: Full    Dental no notable dental hx.    Pulmonary neg pulmonary ROS, asthma , Current Smoker,    Pulmonary exam normal breath sounds clear to auscultation       Cardiovascular hypertension, Normal cardiovascular exam Rhythm:Regular Rate:Normal     Neuro/Psych negative neurological ROS  negative psych ROS   GI/Hepatic negative GI ROS, Neg liver ROS,   Endo/Other  negative endocrine ROSHypothyroidism   Renal/GU negative Renal ROS  negative genitourinary   Musculoskeletal negative musculoskeletal ROS (+)   Abdominal   Peds negative pediatric ROS (+)  Hematology negative hematology ROS (+)   Anesthesia Other Findings   Reproductive/Obstetrics negative OB ROS                            Anesthesia Physical Anesthesia Plan  ASA: III  Anesthesia Plan: MAC   Post-op Pain Management:    Induction: Intravenous  Airway Management Planned: Nasal Cannula  Additional Equipment:   Intra-op Plan:   Post-operative Plan:   Informed Consent: I have reviewed the patients History and Physical, chart, labs and discussed the procedure including the risks, benefits and alternatives for the proposed anesthesia with the patient or authorized representative who has indicated his/her understanding and acceptance.   Dental advisory given  Plan Discussed with: CRNA and Surgeon  Anesthesia Plan Comments: (Benadryl and phenergan pre procedure IV is OK)       Anesthesia Quick Evaluation

## 2016-04-23 NOTE — Transfer of Care (Signed)
Immediate Anesthesia Transfer of Care Note  Patient: Kari Green  Procedure(s) Performed: Procedure(s): UPPER ENDOSCOPIC ULTRASOUND (EUS) LINEAR (N/A)  Patient Location: PACU  Anesthesia Type:MAC  Level of Consciousness: awake, alert  and oriented  Airway & Oxygen Therapy: Patient Spontanous Breathing and Patient connected to nasal cannula oxygen  Post-op Assessment: Report given to RN and Post -op Vital signs reviewed and stable  Post vital signs: Reviewed and stable  Last Vitals:  Vitals:   04/23/16 1236  BP: (!) 80/37  Pulse: 78  Resp: (!) 28  Temp: 36.4 C    Last Pain:  Vitals:   04/23/16 1236  TempSrc: Oral         Complications: No apparent anesthesia complications

## 2016-04-23 NOTE — Progress Notes (Signed)
IV line removed in left antecub as placed by Dr. Kalman Shan without difficulty. Pressure held and no bleeding or hematoma noted.

## 2016-04-23 NOTE — H&P (Signed)
Kari Green HPI: The patient complained about epigastric and back pain.  Work up with an ultrasound revealed an echogenic focus in the head of the pancreas of unknown etiology.  There was also a mild prominence of the PD at 3.5 mm.  She has a long history of abdominal pain.    Past Medical History:  Diagnosis Date  . Anxiety   . Arthritis    right thumb  . Asthma   . Cancer [facility])    history of skin cancer  . Chronic, continuous use of opioids   . Cognitive dysfunction    patient denies  . Complication of anesthesia    "not asleep and aware of everything"  . Degenerative disc disease, cervical   . Degenerative disc disease, lumbar   . Delayed gastric emptying   . Dental crowns present   . Difficult intravenous access   . Difficulty swallowing pills   . Esophageal dysmotility   . Family history of adverse reaction to anesthesia    family - itching, hives, PONV  . Fibromyalgia   . Hemangioma of liver   . Hemangioma, renal   . History of bronchitis   . History of hiatal hernia   . History of kidney stones 04/2014  . History of pneumonia   . Hypertension   . Hypothyroidism (acquired)   . IC (interstitial cystitis)   . Irritable bowel syndrome (IBS)   . Kidney stone   . Knee pain, right    states gets "nerves burned" periodically  . Limited joint range of motion    right knee - unable to fully extend right leg  . Memory impairment    due to pain   . Migraines   . Osteoporosis   . Pain management   . Pain syndrome, chronic   . Pneumonia 2016  . PONV (postoperative nausea and vomiting)    uncontrollable itching with anesthesia; hx. of anesthesia awareness; pt needs 50  mg of Benadryl  . PONV (postoperative nausea and vomiting)    "need medication before and need medication afterwards"  . Sciatica    bilateral  . Seasonal allergies     Past Surgical History:  Procedure Laterality Date  . ABDOMINAL HYSTERECTOMY    . ANTERIOR CERVICAL DECOMP/DISCECTOMY FUSION   03/10/2009   C5-6, C6-7  . ANTERIOR CERVICAL DECOMP/DISCECTOMY FUSION N/A 07/16/2015   Procedure: ANTERIOR CERVICAL DECOMPRESSION FUSION 4-5 WITH INSTRUMENTATION AND AUTOGRAFT;  Surgeon: Phylliss Bob, MD;  Location: [place];  Service: Orthopedics;  Laterality: N/A;  ANTERIOR CERVICAL DECOMPRESSION FUSION 4-5 WITH INSTRUMENTATION AND ALLOGRAFT  . APPENDECTOMY  ~ 1992  . AUGMENTATION MAMMAPLASTY Bilateral 1996; 2001, 2016  . BREAST SURGERY  1980's   accessory breast tissue exc.  Marland Kitchen CARPAL TUNNEL RELEASE Right 07/26/2001  . CARPAL TUNNEL RELEASE Left   . CARPOMETACARPEL SUSPENSION PLASTY Right 02/10/2015   Procedure: RIGHT THUMB SUSPENSION  ARTHROPLASTY;  Surgeon: Milly Jakob, MD;  Location: [place];  Service: Orthopedics;  Laterality: Right;  ANESTHESIA: PRE- OP BLOCK  . COLONOSCOPY W/ POLYPECTOMY    . CYSTO WITH HYDRODISTENSION  01/29/2011   Procedure: CYSTOSCOPY/HYDRODISTENSION;  Surgeon: Claybon Jabs, MD;  Location: [facility];  Service: Urology;  Laterality: N/A;  . CYSTO WITH HYDRODISTENSION N/A 05/08/2014   Procedure: CYSTOSCOPY/HYDRODISTENSION WITH BILATERAL RETROGRADES;  Surgeon: Ardis Hughs, MD;  Location: [facility];  Service: Urology;  Laterality: N/A;  . CYSTO WITH HYDRODISTENSION N/A 03/27/2015   Procedure: CYSTOSCOPY/HYDRODISTENSION OF BLADDER,  INSTILLATION OF MARCAINE/PYRIDIUM;  Surgeon: Ardis Hughs, MD;  Location: [facility];  Service: Urology;  Laterality: N/A;  . CYSTO/  BLADDER BX  03/08/2008  . CYSTO/ URETHRAL DILATION/ HYDRODISTENTION/ BLADDER BX  04/10/2003  . CYSTOSCOPY W/ RETROGRADES  01/29/2011   Procedure: CYSTOSCOPY WITH RETROGRADE PYELOGRAM;  Surgeon: Claybon Jabs, MD;  Location: [facility];  Service: Urology;  Laterality: Bilateral;  . CYSTOSCOPY WITH STENT PLACEMENT  2006   URETERAL  . DEBRIDEMENT RIGHT THUMB MP JOINT Right 04/10/2002  . DECOMPRESSION LEFT ULNAR NERVE, ELBOW  Left 01/29/2003  . ESOPHAGOGASTRODUODENOSCOPY (EGD) WITH ESOPHAGEAL DILATION  10-26-2000  . EXCISION HAGLUND'S DEFORMITY WITH ACHILLES TENDON REPAIR Bilateral 2013-2014  . EYE SURGERY Bilateral    x4  . FISSURECTOMY  ~ 1988 x2  . GANGLION CYST EXCISION Right 02/10/2015   Procedure: REMOVAL GANGLION OF WRIST, PARTIAL ULNAR EXCISION;  Surgeon: Milly Jakob, MD;  Location: [place];  Service: Orthopedics;  Laterality: Right;  . HAND SURGERY Right 01-31-15  . HEMORROIDECTOMY    . KIDNEY STONE SURGERY    . KNEE ARTHROSCOPY Right 08/16/2000  . KNEE ARTHROSCOPY W/ LATERAL RELEASE Right 11/29/2000   states 13 surgeries on right knee  . KNEE ARTHROSCOPY WITH FULKERSON SLIDE Right 11/29/2000  . LAPAROSCOPIC ASSISTED VAGINAL HYSTERECTOMY  Feb 1999  . LAPAROSCOPIC CHOLECYSTECTOMY  03/04/2002  . LAPAROSCOPIC LYSIS OF ADHESIONS  02/11/2012   Procedure: LAPAROSCOPIC LYSIS OF ADHESIONS;  Surgeon: Cheri Fowler, MD;  Location: [facility];  Service: Gynecology;;  . LAPAROSCOPY  02/11/2012   Procedure: LAPAROSCOPY DIAGNOSTIC;  Surgeon: Cheri Fowler, MD;  Location: [facility];  Service: Gynecology;  Laterality: N/A;  Diagnostic  Operative Laparoscopic   . LAPAROSCOPY ADHESIOLYSIS AND REMOVAL POSSIBLE RIGHT OVARY REMNANT  04/18/2000  . LAPAROSCOPY LYSIS ADHESIONS/  BILATERAL SALPINGOOPHORECTOMY/  [place] OF ENDOMETRIOSIS  1998  . MELANOMA EXCISION Left 2013   "behind knee"   . MELANOMA EXCISION     scalp  . NASAL SINUS SURGERY  2013  . NEGATIVE SLEEP STUDY  2006 approx .  per pt  . REFRACTIVE SURGERY Bilateral ~ 1999; ~ 2013  . RIGHT THUMB FUSION OF MPJ Right 10/03/2002  . RIGHT TOTAL KNEE REVISION ARTHROPLASTY Right 03/14/2007  . SHOULDER HEMI-ARTHROPLASTY Left 12/04/2015   Procedure: SHOULDER HEMI-ARTHROPLASTY;  Surgeon: Tania Ade, MD;  Location: [place];  Service: Orthopedics;  Laterality: Left;  Left shoulder hemiarthroplasty  . SHOULDER SURGERY Left   . TONSILLECTOMY AND ADENOIDECTOMY   ~ 1989  . TOTAL KNEE ARTHROPLASTY Right 09/10/2003; 12/08/2012   x3  . TOTAL THYROIDECTOMY  1993  . TRANSANAL HEMORRHOIDAL DEARTERIALIZATION  03/26/2011  . TRANSTHORACIC ECHOCARDIOGRAM  09/22/2012  . VIDEO BRONCHOSCOPY Bilateral 10/20/2015   Procedure: VIDEO BRONCHOSCOPY WITHOUT FLUORO;  Surgeon: Collene Gobble, MD;  Location: [place];  Service: Cardiopulmonary;  Laterality: Bilateral;    Family History  Problem Relation Age of Onset  . Anesthesia problems Mother   . Cancer Mother     ovarian  . Leukemia Mother   . Cancer Daughter     cervical  . Cancer Maternal Aunt     breast    Social History:  reports that she has been smoking.  She has never used smokeless tobacco. She reports that she drinks alcohol. She reports that she does not use drugs.  Allergies:  Allergies  Allergen Reactions  . Cobalt Hives, Swelling and Other (See Comments)    DESTRUCTION OF BONE  . Latex Hives and  Swelling    TIGHTNESS IN THROAT ALSO  . Nickel Hives, Swelling and Other (See Comments)    DESTRUCTION OF BONE  . Peanuts [Peanut Oil] Hives, Swelling and Other (See Comments)    TIGHTNESS IN THROAT  . Red Dye Hives, Itching and Swelling  . Azithromycin Swelling    Undefined area  . Chloraprep One Step [Chlorhexidine Gluconate] Other (See Comments)    CHEMICAL BURN, BLISTERS  . Corticosteroids Swelling  . Flexeril [Cyclobenzaprine] Swelling    Undefined area  . Morphine And Related Hives and Itching    ALL PAIN MEDS - MUST HAVE BENADRYL PRIOR TO PAIN MED.  . Other Hives and Other (See Comments)     Burns skin ALL METALS - HIVES, SWELLING Over the counter cough syrup- hives.  Antibiotic given for tick bites- swelling, hives, rash LOTIONS - HIVES (DUE TO THE DYE) FRAGRANCES - CAUSES MIGRAINES NOTHING WITH DYE- Hives, swelling, itching  . Percocet [Oxycodone-Acetaminophen] Swelling    Undefined area  . Supartz [Sodium Hyaluronate] Hives and Swelling  . Surgical Lubricant Swelling  .  Yellow Dyes (Non-Tartrazine) Hives       . Prednisone Other (See Comments)  . Soap Itching    DIAL, DOVE  . Tape Other (See Comments)    BURNS SKIN Needs "Peds" EKG leads that are latex-free    Medications:  Scheduled: .  ceFAZolin (ANCEF) IV  2 g Intravenous To OR   Continuous: . sodium chloride      No results found for this or any previous visit (from the past 24 hour(s)).   No results found.  ROS:  As stated above in the HPI otherwise negative.  There were no vitals taken for this visit.    PE: Gen: NAD, Alert and Oriented HEENT:  [place]/AT, EOMI Neck: Supple, no LAD Lungs: CTA Bilaterally CV: RRR without M/G/R ABM: Soft, NTND, +BS Ext: No C/C/E  Assessment/Plan: 1) Echogenic focus in the head of the pancreas.  Plan: 1) EUS.  Sandria Mcenroe D 04/23/2016, 10:51 AM

## 2016-04-23 NOTE — Anesthesia Postprocedure Evaluation (Signed)
Anesthesia Post Note  Patient: Kari Green  Procedure(s) Performed: Procedure(s) (LRB): UPPER ENDOSCOPIC ULTRASOUND (EUS) LINEAR (N/A)  Patient location during evaluation: PACU Anesthesia Type: MAC Level of consciousness: awake and alert Pain management: pain level controlled Vital Signs Assessment: post-procedure vital signs reviewed and stable Respiratory status: spontaneous breathing, nonlabored ventilation, respiratory function stable and patient connected to nasal cannula oxygen Cardiovascular status: stable and blood pressure returned to baseline Anesthetic complications: no       Last Vitals:  Vitals:   04/23/16 1255 04/23/16 1300  BP:  (!) 93/46  Pulse: 70 64  Resp: 12 14  Temp:      Last Pain:  Vitals:   04/23/16 1236  TempSrc: Oral                 Inioluwa Boulay S

## 2016-04-26 ENCOUNTER — Encounter (HOSPITAL_COMMUNITY): Payer: Self-pay | Admitting: Gastroenterology

## 2016-04-29 DIAGNOSIS — M25551 Pain in right hip: Secondary | ICD-10-CM | POA: Diagnosis not present

## 2016-04-29 DIAGNOSIS — Z79891 Long term (current) use of opiate analgesic: Secondary | ICD-10-CM | POA: Diagnosis not present

## 2016-04-29 DIAGNOSIS — M79661 Pain in right lower leg: Secondary | ICD-10-CM | POA: Diagnosis not present

## 2016-04-29 DIAGNOSIS — M25512 Pain in left shoulder: Secondary | ICD-10-CM | POA: Diagnosis not present

## 2016-04-29 DIAGNOSIS — G43109 Migraine with aura, not intractable, without status migrainosus: Secondary | ICD-10-CM | POA: Diagnosis not present

## 2016-04-30 ENCOUNTER — Ambulatory Visit: Payer: BLUE CROSS/BLUE SHIELD | Admitting: Neurology

## 2016-04-30 ENCOUNTER — Telehealth: Payer: Self-pay | Admitting: Neurology

## 2016-04-30 ENCOUNTER — Encounter: Payer: Self-pay | Admitting: Neurology

## 2016-04-30 NOTE — Telephone Encounter (Signed)
Kari Green 01/20/1966.  She missed her appointment and unable to make early appointments in the future. I tried to reschedule her Botox for the Botox day but she did not want to wait until May. She would like you to call her to get a sooner appointment. Thank you

## 2016-04-30 NOTE — Telephone Encounter (Signed)
No show for today. No appts available. Patient will need to wait til May.

## 2016-05-03 ENCOUNTER — Telehealth: Payer: Self-pay | Admitting: Pain Medicine

## 2016-05-03 NOTE — Telephone Encounter (Signed)
Angie will call the patient to discuss with her.

## 2016-05-03 NOTE — Telephone Encounter (Signed)
Patient called stating she wants to go ahead with RF of the knee, then come in for follow up of both RF procedures. Is it ok to do this ? Right now she is scheduled for follow up of RF from Feb and notes from that appt had only to schedule follow up appt nothing about RF of the knee

## 2016-05-03 NOTE — Telephone Encounter (Signed)
Left voicemail for patient to call with additional information, checked with Angie re: PA

## 2016-05-04 ENCOUNTER — Telehealth: Payer: Self-pay | Admitting: Neurology

## 2016-05-04 NOTE — Telephone Encounter (Signed)
Patient dismissed from [facility] by Metta Clines DO , effective April 30, 2016. Dismissal letter sent out by certified / registered mail.  DAJ

## 2016-05-06 DIAGNOSIS — M47816 Spondylosis without myelopathy or radiculopathy, lumbar region: Secondary | ICD-10-CM | POA: Diagnosis not present

## 2016-05-06 DIAGNOSIS — M47818 Spondylosis without myelopathy or radiculopathy, sacral and sacrococcygeal region: Secondary | ICD-10-CM | POA: Diagnosis not present

## 2016-05-11 NOTE — Telephone Encounter (Signed)
Received signed domestic return receipt verifying delivery of certified letter on May 06, 2016. Article number [id number]

## 2016-05-17 NOTE — Telephone Encounter (Signed)
We have a policy that patients can be discharged from the practice if they have had 3 no-shows. She has exceeded this number after giving her other chances. Unfortunately, we will have to discharge her.  Metta Clines

## 2016-05-24 ENCOUNTER — Ambulatory Visit: Payer: BLUE CROSS/BLUE SHIELD | Admitting: Pain Medicine

## 2016-05-24 DIAGNOSIS — H18832 Recurrent erosion of cornea, left eye: Secondary | ICD-10-CM | POA: Diagnosis not present

## 2016-05-25 DIAGNOSIS — H18832 Recurrent erosion of cornea, left eye: Secondary | ICD-10-CM | POA: Diagnosis not present

## 2016-06-14 ENCOUNTER — Ambulatory Visit (INDEPENDENT_AMBULATORY_CARE_PROVIDER_SITE_OTHER): Payer: BLUE CROSS/BLUE SHIELD | Admitting: Allergy and Immunology

## 2016-06-14 ENCOUNTER — Encounter: Payer: Self-pay | Admitting: Allergy and Immunology

## 2016-06-14 VITALS — BP 118/70 | HR 100 | Resp 16 | Wt 140.6 lb

## 2016-06-14 DIAGNOSIS — J4541 Moderate persistent asthma with (acute) exacerbation: Secondary | ICD-10-CM | POA: Diagnosis not present

## 2016-06-14 DIAGNOSIS — J3089 Other allergic rhinitis: Secondary | ICD-10-CM | POA: Diagnosis not present

## 2016-06-14 MED ORDER — MOMETASONE FURO-FORMOTEROL FUM 200-5 MCG/ACT IN AERO: 2.0000 | INHALATION_SPRAY | Freq: Two times a day (BID) | RESPIRATORY_TRACT | 5 refills | Status: DC

## 2016-06-14 MED ORDER — MONTELUKAST SODIUM 10 MG PO TABS: 10.0000 mg | ORAL_TABLET | Freq: Every day | ORAL | 5 refills | Status: DC

## 2016-06-14 MED ORDER — ALBUTEROL SULFATE (2.5 MG/3ML) 0.083% IN NEBU
2.5000 mg | INHALATION_SOLUTION | RESPIRATORY_TRACT | 1 refills | Status: DC | PRN
Start: 2016-06-14 — End: 2017-06-28

## 2016-06-14 MED ORDER — AZELASTINE HCL 0.15 % NA SOLN: 1.0000 | Freq: Two times a day (BID) | NASAL | 5 refills | Status: DC

## 2016-06-14 MED ORDER — ALBUTEROL SULFATE HFA 108 (90 BASE) MCG/ACT IN AERS: 2.0000 | INHALATION_SPRAY | RESPIRATORY_TRACT | 3 refills | Status: DC | PRN

## 2016-06-14 NOTE — Progress Notes (Signed)
Follow-up Note  RE: Kari Green MRN: [id number] DOB: 1965/06/29 Date of Office Visit: 06/14/2016  Primary care provider: Thressa Sheller, MD Referring provider: Thressa Sheller, MD  History of present illness: Kari Green is a 51 y.o. female with a complex medical history, persistent asthma, and allergic rhinoconjunctivitis presenting today for follow up.  She was last seen in this clinic in January 2017.  She is accompanied today by her husband who assists with the history.  She reports that she saw pulmonologist in August 2017 for hemoptysis.  She was assessed with bronchoscopy and told that the source of the blood was from the upper respiratory tract and that there were no lesions visualized in the lower airway.  She also left that interaction with the understanding that she did not have asthma and was no longer needed to take her asthma medications.  Therefore, she discontinued Dulera as well as albuterol at that time.  She has continued to take montelukast 10 mg daily, however she ran out of this medication recently.  She states that she experiences frequent chest tightness but has not attempted to relieve this symptom with albuterol.  She stopped receiving aeroallergen immunotherapy injections in October 2017 because of orthopedic and dental issues.  She states that she did experience symptom relief from the immunotherapy injections and would like to restart the injections.  She complains of copious thick postnasal drainage which has led to hoarseness, throat clearing, and vomiting.  Fluticasone nasal spray has helped to some degree however does not bring about adequate symptom relief.  She has had an EGD which does not reveal evidence of gastroesophageal reflux disease.   Assessment and plan: Allergic rhinitis  Continue appropriate allergen avoidance measures.  A prescription has been provided for azelastine nasal spray, 1-2 sprays per nostril 2 times daily as needed. Proper  nasal spray technique has been discussed and demonstrated.   Nasal saline lavage (NeilMed) has been recommended as needed and prior to medicated nasal sprays along with instructions for proper administration.  For thick post nasal drainage, add guaifenesin [phone] mg (Mucinex)  twice daily as needed with adequate hydration as discussed.  Will plan to restart immunotherapy in the near future.  Moderate persistent asthma  A sample and prescription have been provided for Dulera 200-5 g, 2 inhalations via spacer device twice a day.  A refill prescription has been provided for montelukast 10 mg daily bedtime.  Restart/continue albuterol HFA, 1-2 inhalations every 4-6 hours as needed.  The patient has been asked to contact me if her symptoms persist or progress. Otherwise, she may return for follow up in 4 months.   Meds ordered this encounter  Medications  . albuterol (PROAIR HFA) 108 (90 Base) MCG/ACT inhaler    Sig: Inhale 2 puffs into the lungs every 4 (four) hours as needed for wheezing or shortness of breath.    Dispense:  1 Inhaler    Refill:  3  . albuterol (PROVENTIL) (2.5 MG/3ML) 0.083% nebulizer solution    Sig: Take 3 mLs (2.5 mg total) by nebulization every 4 (four) hours as needed for wheezing (or coughing spells).    Dispense:  75 vial    Refill:  1    KEEP ON FILE - PT WILL CALL WHEN READY  . mometasone-formoterol (DULERA) 200-5 MCG/ACT AERO    Sig: Inhale 2 puffs into the lungs 2 (two) times daily.    Dispense:  13 g    Refill:  5  . montelukast (SINGULAIR) 10  MG tablet    Sig: Take 1 tablet (10 mg total) by mouth at bedtime.    Dispense:  30 tablet    Refill:  5  . Azelastine HCl 0.15 % SOLN    Sig: Place 1-2 sprays into both nostrils 2 (two) times daily.    Dispense:  30 mL    Refill:  5    Diagnostics: Spirometry reveals an FVC of 2.68 L (91% predicted) and an FEV1 of 0.79 L (32% predicted) with an FEV1 ratio of 35%.  There was significant (380 mL, 49%)  postbronchodilator improvement.  Severe airways obstruction with significant reversibility.  Please see scanned spirometry results for details.    Physical examination: Blood pressure 118/70, pulse 100, resp. rate 16, weight 140 lb 9.6 oz (63.8 kg).  General: Alert, interactive, in no acute distress. HEENT: TMs pearly gray, turbinates moderately edematous without discharge, post-pharynx moderately erythematous. Neck: Supple without lymphadenopathy. Lungs: Mildly decreased breath sounds bilaterally without wheezing, rhonchi or rales. CV: Normal S1, S2 without murmurs. Skin: Warm and dry, without lesions or rashes.  The following portions of the patient's history were reviewed and updated as appropriate: allergies, current medications, past family history, past medical history, past social history, past surgical history and problem list.  Allergies as of 06/14/2016      Reactions   Cobalt Hives, Swelling, Other (See Comments)   DESTRUCTION OF BONE   Latex Hives, Swelling   TIGHTNESS IN THROAT ALSO   Nickel Hives, Swelling, Other (See Comments)   DESTRUCTION OF BONE   Peanuts [peanut Oil] Hives, Swelling, Other (See Comments)   TIGHTNESS IN THROAT   Red Dye Hives, Itching, Swelling   Has been able to tolerate montelukast without symptoms.   Azithromycin Swelling   Undefined area   Chloraprep One Step [chlorhexidine Gluconate] Other (See Comments)   CHEMICAL BURN, BLISTERS   Corticosteroids Swelling   Flexeril [cyclobenzaprine] Swelling   Undefined area   Morphine And Related Hives, Itching   ALL PAIN MEDS - MUST HAVE BENADRYL PRIOR TO PAIN MED.   Other Hives, Other (See Comments)   Burns skin ALL METALS - HIVES, SWELLING Over the counter cough syrup- hives.  Antibiotic given for tick bites- swelling, hives, rash LOTIONS - HIVES (DUE TO THE DYE) FRAGRANCES - CAUSES MIGRAINES NOTHING WITH DYE- Hives, swelling, itching   Percocet [oxycodone-acetaminophen] Swelling   Undefined  area   Supartz [sodium Hyaluronate] Hives, Swelling   Surgical Lubricant Swelling   Yellow Dyes (non-tartrazine) Hives       Prednisone Other (See Comments)   Soap Itching   DIAL, DOVE   Tape Other (See Comments)   BURNS SKIN Needs "Peds" EKG leads that are latex-free      Medication List       Accurate as of 06/14/16  7:43 PM. Always use your most recent med list.          albuterol 108 (90 Base) MCG/ACT inhaler Commonly known as:  PROAIR HFA Inhale 2 puffs into the lungs every 4 (four) hours as needed for wheezing or shortness of breath.   albuterol (2.5 MG/3ML) 0.083% nebulizer solution Commonly known as:  PROVENTIL Take 3 mLs (2.5 mg total) by nebulization every 4 (four) hours as needed for wheezing (or coughing spells).   ALPRAZolam 0.5 MG tablet Commonly known as:  XANAX Take 1 mg by mouth 3 (three) times daily as needed. Anxiety   Azelastine HCl 0.15 % Soln Place 1-2 sprays into both nostrils 2 (two) times  daily.   BOTOX IJ Inject as directed every 3 (three) months. For migraines   cyanocobalamin 1000 MCG/ML injection Commonly known as:  (VITAMIN B-12) Inject 1,000 mcg into the muscle once a week. Reported on 03/25/2015   DEPO-MEDROL IJ Inject 1 each as directed daily as needed (allergic reaction.).   diphenhydrAMINE 50 MG tablet Commonly known as:  BENADRYL Take 50 mg by mouth every 4 (four) hours as needed for itching.   EPINEPHrine 0.3 mg/0.3 mL Soaj injection Commonly known as:  EPIPEN 2-PAK Inject 0.3 mLs (0.3 mg total) into the muscle as needed (in the event of a severe life-threatening reaction).   estradiol 0.5 MG tablet Commonly known as:  ESTRACE Take 1 mg by mouth at bedtime.   EVZIO 0.4 MG/0.4ML Soaj Generic drug:  Naloxone HCl AUTO INJECT AS NEEDED IN CASE OF EMERGENCY WHERE GRANDCHILDREN MAY GET A HOLD OF PATIENTS PAIN MEDICATIONS.   fluticasone 50 MCG/ACT nasal spray Commonly known as:  FLONASE PLACE 2 SPRAYS INTO BOTH NOSTRILS  DAILY.   gabapentin 300 MG capsule Commonly known as:  NEURONTIN Take 300 mg by mouth 3 (three) times daily.   HYDROmorphone 2 MG tablet Commonly known as:  DILAUDID Take 1-3 tablets (2-6 mg total) by mouth every 4 (four) hours as needed for moderate pain or severe pain.   hydrOXYzine 25 MG tablet Commonly known as:  ATARAX/VISTARIL Take 50 mg by mouth every 4 (four) hours as needed for itching. Alternates with ben   ketoconazole 2 % shampoo Commonly known as:  NIZORAL One application once a month as needed for flaking   levothyroxine 50 MCG tablet Commonly known as:  SYNTHROID, LEVOTHROID Take 200 mcg by mouth daily. Pt can only take name brand   meloxicam 15 MG tablet Commonly known as:  MOBIC Take 15 mg by mouth daily.   mometasone-formoterol 200-5 MCG/ACT Aero Commonly known as:  DULERA Inhale 2 puffs into the lungs 2 (two) times daily.   montelukast 10 MG tablet Commonly known as:  SINGULAIR Take 1 tablet (10 mg total) by mouth at bedtime.   multivitamin with minerals Tabs tablet Take 1 tablet by mouth at bedtime.   promethazine 25 MG tablet Commonly known as:  PHENERGAN Take 1-2 tablets (25-50 mg total) by mouth 2 (two) times daily as needed for nausea or vomiting.   propranolol 20 MG tablet Commonly known as:  INDERAL Take 20 mg by mouth 2 (two) times daily.   RECLAST IV Inject into the vein as directed. Yearly --  Last dose Oct 2015   SUMAtriptan 100 MG tablet Commonly known as:  IMITREX Take 1 tablet at earliest onset of headache.  May repeat once in 2 hours if headache persists or recurs.  Do not exceed 2 tabs in 24 hours   SUMAtriptan 6 MG/0.5ML Soaj Inject 6 mg into the skin daily as needed (for migraine headache.).   topiramate 25 MG tablet Commonly known as:  TOPAMAX Take 75 mg by mouth 2 (two) times daily.   UNABLE TO FIND once a week. Allergy injections - Two shots once weekly   vitamin C 500 MG tablet Commonly known as:  ASCORBIC  ACID Take 1,000 mg by mouth daily.       Allergies  Allergen Reactions  . Cobalt Hives, Swelling and Other (See Comments)    DESTRUCTION OF BONE  . Latex Hives and Swelling    TIGHTNESS IN THROAT ALSO  . Nickel Hives, Swelling and Other (See Comments)  DESTRUCTION OF BONE  . Peanuts [Peanut Oil] Hives, Swelling and Other (See Comments)    TIGHTNESS IN THROAT  . Red Dye Hives, Itching and Swelling    Has been able to tolerate montelukast without symptoms.  . Azithromycin Swelling    Undefined area  . Chloraprep One Step [Chlorhexidine Gluconate] Other (See Comments)    CHEMICAL BURN, BLISTERS  . Corticosteroids Swelling  . Flexeril [Cyclobenzaprine] Swelling    Undefined area  . Morphine And Related Hives and Itching    ALL PAIN MEDS - MUST HAVE BENADRYL PRIOR TO PAIN MED.  . Other Hives and Other (See Comments)     Burns skin ALL METALS - HIVES, SWELLING Over the counter cough syrup- hives.  Antibiotic given for tick bites- swelling, hives, rash LOTIONS - HIVES (DUE TO THE DYE) FRAGRANCES - CAUSES MIGRAINES NOTHING WITH DYE- Hives, swelling, itching  . Percocet [Oxycodone-Acetaminophen] Swelling    Undefined area  . Supartz [Sodium Hyaluronate] Hives and Swelling  . Surgical Lubricant Swelling  . Yellow Dyes (Non-Tartrazine) Hives       . Prednisone Other (See Comments)  . Soap Itching    DIAL, DOVE  . Tape Other (See Comments)    BURNS SKIN Needs "Peds" EKG leads that are latex-free   Review of systems: Review of systems negative except as noted in HPI / PMHx or noted below: Constitutional: Negative.  HENT: Negative.   Eyes: Negative.  Respiratory: Negative.   Cardiovascular: Negative.  Gastrointestinal: Negative.  Genitourinary: Negative.  Musculoskeletal: Negative.  Neurological: Negative.  Endo/Heme/Allergies: Negative.  Cutaneous: Negative.  Past Medical History:  Diagnosis Date  . Anxiety   . Arthritis    right thumb  . Asthma   . Cancer  [facility])    history of skin cancer  . Chronic, continuous use of opioids   . Cognitive dysfunction    patient denies  . Complication of anesthesia    "not asleep and aware of everything"  . Degenerative disc disease, cervical   . Degenerative disc disease, lumbar   . Delayed gastric emptying   . Dental crowns present   . Difficult intravenous access   . Difficulty swallowing pills   . Esophageal dysmotility   . Family history of adverse reaction to anesthesia    family - itching, hives, PONV  . Fibromyalgia   . Hemangioma of liver   . Hemangioma, renal   . History of bronchitis   . History of hiatal hernia   . History of kidney stones 04/2014  . History of pneumonia   . Hypertension   . Hypothyroidism (acquired)   . IC (interstitial cystitis)   . Irritable bowel syndrome (IBS)   . Kidney stone   . Knee pain, right    states gets "nerves burned" periodically  . Limited joint range of motion    right knee - unable to fully extend right leg  . Memory impairment    due to pain   . Migraines   . Osteoporosis   . Pain management   . Pain syndrome, chronic   . Pneumonia 2016  . PONV (postoperative nausea and vomiting)    uncontrollable itching with anesthesia; hx. of anesthesia awareness; pt needs 50  mg of Benadryl  . PONV (postoperative nausea and vomiting)    "need medication before and need medication afterwards"  . Sciatica    bilateral  . Seasonal allergies     Family History  Problem Relation Age of Onset  . Anesthesia problems  Mother   . Cancer Mother     ovarian  . Leukemia Mother   . Cancer Daughter     cervical  . Cancer Maternal Aunt     breast    Social History   Social History  . Marital status: Married    Spouse name: N/A  . Number of children: N/A  . Years of education: N/A   Occupational History  . Not on file.   Social History Main Topics  . Smoking status: Current Some Day Smoker    Last attempt to quit: 02/22/2006  . Smokeless tobacco:  Never Used  . Alcohol use 0.0 oz/week     Comment: rarely  . Drug use: No  . Sexual activity: Not on file   Other Topics Concern  . Not on file   Social History Narrative   Lives with husband.      I appreciate the opportunity to take part in Hinda's care. Please do not hesitate to contact me with questions.  Sincerely,   R. Edgar Frisk, MD

## 2016-06-14 NOTE — Assessment & Plan Note (Addendum)
   Continue appropriate allergen avoidance measures.  A prescription has been provided for azelastine nasal spray, 1-2 sprays per nostril 2 times daily as needed. Proper nasal spray technique has been discussed and demonstrated.   Nasal saline lavage (NeilMed) has been recommended as needed and prior to medicated nasal sprays along with instructions for proper administration.  For thick post nasal drainage, add guaifenesin [phone] mg (Mucinex)  twice daily as needed with adequate hydration as discussed.  Will plan to restart immunotherapy in the near future.

## 2016-06-14 NOTE — Patient Instructions (Addendum)
Allergic rhinitis  Continue appropriate allergen avoidance measures.  A prescription has been provided for azelastine nasal spray, 1-2 sprays per nostril 2 times daily as needed. Proper nasal spray technique has been discussed and demonstrated.   Nasal saline lavage (NeilMed) has been recommended as needed and prior to medicated nasal sprays along with instructions for proper administration.  For thick post nasal drainage, add guaifenesin [phone] mg (Mucinex)  twice daily as needed with adequate hydration as discussed.  Will plan to restart immunotherapy in the near future.  Moderate persistent asthma  A sample and prescription have been provided for Dulera 200-5 g, 2 inhalations via spacer device twice a day.  A refill prescription has been provided for montelukast 10 mg daily bedtime.  Restart/continue albuterol HFA, 1-2 inhalations every 4-6 hours as needed.  The patient has been asked to contact me if her symptoms persist or progress. Otherwise, she may return for follow up in 4 months.   Return in about 4 months (around 10/14/2016), or if symptoms worsen or fail to improve.  Reducing Pollen Exposure  The American Academy of Allergy, Asthma and Immunology suggests the following steps to reduce your exposure to pollen during allergy seasons.    1. Do not hang sheets or clothing out to dry; pollen may collect on these items. 2. Do not mow lawns or spend time around freshly cut grass; mowing stirs up pollen. 3. Keep windows closed at night.  Keep car windows closed while driving. 4. Minimize morning activities outdoors, a time when pollen counts are usually at their highest. 5. Stay indoors as much as possible when pollen counts or humidity is high and on windy days when pollen tends to remain in the air longer. 6. Use air conditioning when possible.  Many air conditioners have filters that trap the pollen spores. 7. Use a HEPA room air filter to remove pollen form the indoor air  you breathe.   Control of House Dust Mite Allergen  House dust mites play a major role in allergic asthma and rhinitis.  They occur in environments with high humidity wherever human skin, the food for dust mites is found. High levels have been detected in dust obtained from mattresses, pillows, carpets, upholstered furniture, bed covers, clothes and soft toys.  The principal allergen of the house dust mite is found in its feces.  A gram of dust may contain 1,000 mites and 250,000 fecal particles.  Mite antigen is easily measured in the air during house cleaning activities.    1. Encase mattresses, including the box spring, and pillow, in an air tight cover.  Seal the zipper end of the encased mattresses with wide adhesive tape. 2. Wash the bedding in water of 130 degrees Farenheit weekly.  Avoid cotton comforters/quilts and flannel bedding: the most ideal bed covering is the dacron comforter. 3. Remove all upholstered furniture from the bedroom. 4. Remove carpets, carpet padding, rugs, and non-washable window drapes from the bedroom.  Wash drapes weekly or use plastic window coverings. 5. Remove all non-washable stuffed toys from the bedroom.  Wash stuffed toys weekly. 6. Have the room cleaned frequently with a vacuum cleaner and a damp dust-mop.  The patient should not be in a room which is being cleaned and should wait 1 hour after cleaning before going into the room. 7. Close and seal all heating outlets in the bedroom.  Otherwise, the room will become filled with dust-laden air.  An electric heater can be used to heat the room.  Reduce indoor humidity to less than 50%.  Do not use a humidifier.  Control of Dog or Cat Allergen  Avoidance is the best way to manage a dog or cat allergy. If you have a dog or cat and are allergic to dog or cats, consider removing the dog or cat from the home. If you have a dog or cat but don't want to find it a new home, or if your family wants a pet even though  someone in the household is allergic, here are some strategies that may help keep symptoms at bay:  1. Keep the pet out of your bedroom and restrict it to only a few rooms. Be advised that keeping the dog or cat in only one room will not limit the allergens to that room. 2. Don't pet, hug or kiss the dog or cat; if you do, wash your hands with soap and water. 3. High-efficiency particulate air (HEPA) cleaners run continuously in a bedroom or living room can reduce allergen levels over time. 4. Regular use of a high-efficiency vacuum cleaner or a central vacuum can reduce allergen levels. 5. Giving your dog or cat a bath at least once a week can reduce airborne allergen.  Control of Mold Allergen  Mold and fungi can grow on a variety of surfaces provided certain temperature and moisture conditions exist.  Outdoor molds grow on plants, decaying vegetation and soil.  The major outdoor mold, Alternaria and Cladosporium, are found in very high numbers during hot and dry conditions.  Generally, a late Summer - Fall peak is seen for common outdoor fungal spores.  Rain will temporarily lower outdoor mold spore count, but counts rise rapidly when the rainy period ends.  The most important indoor molds are Aspergillus and Penicillium.  Dark, humid and poorly ventilated basements are ideal sites for mold growth.  The next most common sites of mold growth are the bathroom and the kitchen.  Outdoor [employer] 1. Use air conditioning and keep windows closed 2. Avoid exposure to decaying vegetation. 3. Avoid leaf raking. 4. Avoid grain handling. 5. Consider wearing a face mask if working in moldy areas.  Indoor Mold Control 1. Maintain humidity below 50%. 2. Clean washable surfaces with 5% bleach solution. 3. Remove sources e.g. Contaminated carpets.  Control of Cockroach Allergen  Cockroach allergen has been identified as an important cause of acute attacks of asthma, especially in urban settings.   There are fifty-five species of cockroach that exist in the [place], however only three, the [place], [place] species produce allergen that can affect patients with Asthma.  Allergens can be obtained from fecal particles, egg casings and secretions from cockroaches.    1. Remove food sources. 2. Reduce access to water. 3. Seal access and entry points. 4. Spray runways with 0.5-1% Diazinon or Chlorpyrifos 5. Blow boric acid power under stoves and refrigerator. 6. Place bait stations (hydramethylnon) at feeding sites.

## 2016-06-14 NOTE — Assessment & Plan Note (Signed)
   A sample and prescription have been provided for Dulera 200-5 g, 2 inhalations via spacer device twice a day.  A refill prescription has been provided for montelukast 10 mg daily bedtime.  Restart/continue albuterol HFA, 1-2 inhalations every 4-6 hours as needed.  The patient has been asked to contact me if her symptoms persist or progress. Otherwise, she may return for follow up in 4 months.

## 2016-06-21 ENCOUNTER — Telehealth: Payer: Self-pay | Admitting: Neurology

## 2016-06-21 NOTE — Telephone Encounter (Signed)
Pt was seen by you 02/2013, pt is now being referred back frm Dr. Philippa Sicks ofc for headaches but pat wants to see a different provider. Is it ok and what provider can we sched pat with?

## 2016-06-21 NOTE — Telephone Encounter (Signed)
It is Ok to switch to different doctors.

## 2016-06-22 NOTE — Telephone Encounter (Signed)
That's fine. thanks

## 2016-06-24 DIAGNOSIS — G894 Chronic pain syndrome: Secondary | ICD-10-CM | POA: Diagnosis not present

## 2016-06-24 DIAGNOSIS — M25551 Pain in right hip: Secondary | ICD-10-CM | POA: Diagnosis not present

## 2016-06-24 DIAGNOSIS — Z79891 Long term (current) use of opiate analgesic: Secondary | ICD-10-CM | POA: Diagnosis not present

## 2016-06-24 DIAGNOSIS — M25512 Pain in left shoulder: Secondary | ICD-10-CM | POA: Diagnosis not present

## 2016-06-24 DIAGNOSIS — G43109 Migraine with aura, not intractable, without status migrainosus: Secondary | ICD-10-CM | POA: Diagnosis not present

## 2016-06-24 DIAGNOSIS — M79661 Pain in right lower leg: Secondary | ICD-10-CM | POA: Diagnosis not present

## 2016-06-25 ENCOUNTER — Ambulatory Visit: Payer: BLUE CROSS/BLUE SHIELD | Admitting: Neurology

## 2016-06-29 ENCOUNTER — Other Ambulatory Visit: Payer: Self-pay | Admitting: Orthopedic Surgery

## 2016-06-29 DIAGNOSIS — M65312 Trigger thumb, left thumb: Secondary | ICD-10-CM | POA: Diagnosis not present

## 2016-07-01 NOTE — H&P (Signed)
Kari Green is an 51 y.o. female.   CC / Reason for Visit: Left thumb pain HPI: This patient presents for reevaluation, indicating that her left trigger thumb remains a problem, now radiating pain down the volar aspect of the thumb into the distal forearm.  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  She would like to proceed surgically with the left thumb  Past Medical History:  Diagnosis Date  . Anxiety   . Arthritis    right thumb  . Asthma   . Cancer [facility])    history of skin cancer  . Chronic, continuous use of opioids   . Cognitive dysfunction    patient denies  . Complication of anesthesia    "not asleep and aware of everything"  . Degenerative disc disease, cervical   . Degenerative disc disease, lumbar   . Delayed gastric emptying   . Dental crowns present   . Difficult intravenous access   . Difficulty swallowing pills   . Esophageal dysmotility   . Family history of adverse reaction to anesthesia    family - itching, hives, PONV  . Fibromyalgia   . Hemangioma of liver   . Hemangioma, renal   . History of bronchitis   . History of hiatal hernia   . History of kidney stones 04/2014  . History of pneumonia   . Hypertension   . Hypothyroidism (acquired)   . IC (interstitial cystitis)   . Irritable bowel syndrome (IBS)   . Kidney stone   . Knee pain, right    states gets "nerves burned" periodically  . Limited joint range of motion    right knee - unable to fully extend right leg  . Memory impairment    due to pain   . Migraines   . Osteoporosis   . Pain management   . Pain syndrome, chronic   . Pneumonia 2016  . PONV (postoperative nausea and vomiting)    uncontrollable itching with anesthesia; hx. of anesthesia awareness; pt needs 50  mg of Benadryl  . PONV (postoperative nausea and vomiting)    "need medication before and need medication afterwards"  . Sciatica    bilateral  . Seasonal allergies     Past Surgical History:  Procedure Laterality Date  . ABDOMINAL HYSTERECTOMY    . ANTERIOR CERVICAL DECOMP/DISCECTOMY FUSION  03/10/2009   C5-6, C6-7  . ANTERIOR CERVICAL DECOMP/DISCECTOMY FUSION N/A 07/16/2015   Procedure: ANTERIOR CERVICAL DECOMPRESSION FUSION 4-5 WITH INSTRUMENTATION AND AUTOGRAFT;  Surgeon: Phylliss Bob, MD;  Location: [place];  Service: Orthopedics;  Laterality: N/A;  ANTERIOR CERVICAL DECOMPRESSION FUSION 4-5 WITH INSTRUMENTATION AND ALLOGRAFT  . APPENDECTOMY  ~ 1992  . AUGMENTATION MAMMAPLASTY Bilateral 1996; 2001, 2016  . BREAST SURGERY  1980's   accessory breast tissue exc.  Marland Kitchen CARPAL TUNNEL RELEASE Right 07/26/2001  . CARPAL TUNNEL RELEASE Left   . CARPOMETACARPEL SUSPENSION PLASTY Right 02/10/2015   Procedure: RIGHT THUMB SUSPENSION  ARTHROPLASTY;  Surgeon: Milly Jakob, MD;  Location: [place];  Service: Orthopedics;  Laterality: Right;  ANESTHESIA: PRE- OP BLOCK  . COLONOSCOPY W/ POLYPECTOMY    . CYSTO WITH HYDRODISTENSION  01/29/2011   Procedure: CYSTOSCOPY/HYDRODISTENSION;  Surgeon: Claybon Jabs, MD;  Location[facility];  Service: Urology;  Laterality: N/A;  . CYSTO WITH HYDRODISTENSION N/A 05/08/2014   Procedure: CYSTOSCOPY/HYDRODISTENSION WITH BILATERAL RETROGRADES;  Surgeon: Ardis Hughs, MD;  Location: [facility];  Service: Urology;  Laterality: N/A;  . CYSTO WITH HYDRODISTENSION N/A 03/27/2015   Procedure: CYSTOSCOPY/HYDRODISTENSION OF BLADDER, INSTILLATION OF MARCAINE/PYRIDIUM;  Surgeon: Ardis Hughs, MD;  Location: [facility];  Service: Urology;  Laterality: N/A;  . CYSTO/  BLADDER BX  03/08/2008  . CYSTO/ URETHRAL DILATION/ HYDRODISTENTION/ BLADDER BX  04/10/2003  . CYSTOSCOPY W/ RETROGRADES  01/29/2011   Procedure: CYSTOSCOPY WITH RETROGRADE PYELOGRAM;  Surgeon: Claybon Jabs, MD;  Location: [facility];  Service: Urology;   Laterality: Bilateral;  . CYSTOSCOPY WITH STENT PLACEMENT  2006   URETERAL  . DEBRIDEMENT RIGHT THUMB MP JOINT Right 04/10/2002  . DECOMPRESSION LEFT ULNAR NERVE, ELBOW Left 01/29/2003  . ESOPHAGOGASTRODUODENOSCOPY (EGD) WITH ESOPHAGEAL DILATION  10-26-2000  . EUS N/A 04/23/2016   Procedure: UPPER ENDOSCOPIC ULTRASOUND (EUS) LINEAR;  Surgeon: Carol Ada, MD;  Location: [facility];  Service: Endoscopy;  Laterality: N/A;  . EXCISION HAGLUND'S DEFORMITY WITH ACHILLES TENDON REPAIR Bilateral 2013-2014  . EYE SURGERY Bilateral    x4  . FISSURECTOMY  ~ 1988 x2  . GANGLION CYST EXCISION Right 02/10/2015   Procedure: REMOVAL GANGLION OF WRIST, PARTIAL ULNAR EXCISION;  Surgeon: Milly Jakob, MD;  Location: [place];  Service: Orthopedics;  Laterality: Right;  . HAND SURGERY Right 01-31-15  . HEMORROIDECTOMY    . KIDNEY STONE SURGERY    . KNEE ARTHROSCOPY Right 08/16/2000  . KNEE ARTHROSCOPY W/ LATERAL RELEASE Right 11/29/2000   states 13 surgeries on right knee  . KNEE ARTHROSCOPY WITH FULKERSON SLIDE Right 11/29/2000  . LAPAROSCOPIC ASSISTED VAGINAL HYSTERECTOMY  Feb 1999  . LAPAROSCOPIC CHOLECYSTECTOMY  03/04/2002  . LAPAROSCOPIC LYSIS OF ADHESIONS  02/11/2012   Procedure: LAPAROSCOPIC LYSIS OF ADHESIONS;  Surgeon: Cheri Fowler, MD;  Location: [facility];  Service: Gynecology;;  . LAPAROSCOPY  02/11/2012   Procedure: LAPAROSCOPY DIAGNOSTIC;  Surgeon: Cheri Fowler, MD;  Location: [facility];  Service: Gynecology;  Laterality: N/A;  Diagnostic  Operative Laparoscopic   . LAPAROSCOPY ADHESIOLYSIS AND REMOVAL POSSIBLE RIGHT OVARY REMNANT  04/18/2000  . LAPAROSCOPY LYSIS ADHESIONS/  BILATERAL SALPINGOOPHORECTOMY/  [place] OF ENDOMETRIOSIS  1998  . MELANOMA EXCISION Left 2013   "behind knee"   . MELANOMA EXCISION     scalp  . NASAL SINUS SURGERY  2013  . NEGATIVE SLEEP STUDY  2006 approx .  per pt  . REFRACTIVE SURGERY Bilateral ~ 1999; ~ 2013  . RIGHT THUMB FUSION OF MPJ Right  10/03/2002  . RIGHT TOTAL KNEE REVISION ARTHROPLASTY Right 03/14/2007  . SHOULDER HEMI-ARTHROPLASTY Left 12/04/2015   Procedure: SHOULDER HEMI-ARTHROPLASTY;  Surgeon: Tania Ade, MD;  Location: [place];  Service: Orthopedics;  Laterality: Left;  Left shoulder hemiarthroplasty  . SHOULDER SURGERY Left   . TONSILLECTOMY AND ADENOIDECTOMY  ~ 1989  . TOTAL KNEE ARTHROPLASTY Right 09/10/2003; 12/08/2012   x3  . TOTAL THYROIDECTOMY  1993  . TRANSANAL HEMORRHOIDAL DEARTERIALIZATION  03/26/2011  . TRANSTHORACIC ECHOCARDIOGRAM  09/22/2012  . VIDEO BRONCHOSCOPY Bilateral 10/20/2015   Procedure: VIDEO BRONCHOSCOPY WITHOUT FLUORO;  Surgeon: Collene Gobble, MD;  Location: [place];  Service: Cardiopulmonary;  Laterality: Bilateral;    Family History  Problem Relation Age of Onset  . Anesthesia problems Mother   . Cancer Mother        ovarian  . Leukemia Mother   . Cancer Daughter        cervical  . Cancer Maternal Aunt        breast  Social History:  reports that she has been smoking.  She has never used smokeless tobacco. She reports that she drinks alcohol. She reports that she does not use drugs.  Allergies:  Allergies  Allergen Reactions  . Cobalt Hives, Swelling and Other (See Comments)    DESTRUCTION OF BONE  . Latex Hives and Swelling    TIGHTNESS IN THROAT ALSO  . Nickel Hives, Swelling and Other (See Comments)    DESTRUCTION OF BONE  . Peanuts [Peanut Oil] Hives, Swelling and Other (See Comments)    TIGHTNESS IN THROAT  . Red Dye Hives, Itching and Swelling    Has been able to tolerate montelukast without symptoms.  . Azithromycin Swelling    Undefined area  . Chloraprep One Step [Chlorhexidine Gluconate] Other (See Comments)    CHEMICAL BURN, BLISTERS  . Corticosteroids Swelling  . Flexeril [Cyclobenzaprine] Swelling    Undefined area  . Morphine And Related Hives and Itching    ALL PAIN MEDS - MUST HAVE BENADRYL PRIOR TO PAIN MED.  . Other Hives and Other (See  Comments)     Burns skin ALL METALS - HIVES, SWELLING Over the counter cough syrup- hives.  Antibiotic given for tick bites- swelling, hives, rash LOTIONS - HIVES (DUE TO THE DYE) FRAGRANCES - CAUSES MIGRAINES NOTHING WITH DYE- Hives, swelling, itching  . Percocet [Oxycodone-Acetaminophen] Swelling    Undefined area  . Supartz [Sodium Hyaluronate] Hives and Swelling  . Surgical Lubricant Swelling  . Yellow Dyes (Non-Tartrazine) Hives       . Prednisone Other (See Comments)  . Soap Itching    DIAL, DOVE  . Tape Other (See Comments)    BURNS SKIN Needs "Peds" EKG leads that are latex-free    No prescriptions prior to admission.    No results found for this or any previous visit (from the past 48 hour(s)). No results found.  Review of Systems  All other systems reviewed and are negative.   There were no vitals taken for this visit. Physical Exam  Constitutional:  WD, WN, NAD HEENT:  NCAT, EOMI Neuro/Psych:  Alert & oriented to person, place, and time; appropriate mood & affect Lymphatic: No generalized UE edema or lymphadenopathy Extremities / MSK:  Both UE are normal with respect to appearance, ranges of motion, joint stability, muscle strength/tone, sensation, & perfusion except as otherwise noted:  Left thumb TTP over the A1 pulley, catches with each flexion cycle.  The IP joint has no hyperextension, but the right side has a little.  Labs / Xrays:  No radiographic studies obtained today.  Assessment:  Left trigger thumb-chronic  Plan: I discussed today's findings with her.  She would like to proceed surgically, and we will schedule this for Friday.The details of the operative procedure were discussed with the patient.  Questions were invited and answered.  In addition to the goal of the procedure, the risks of the procedure to include but not limited to bleeding; infection; damage to the nerves or blood vessels that could result in bleeding, numbness, weakness,  chronic pain, and the need for additional procedures; stiffness; the need for revision surgery; and anesthetic risks were reviewed.  No specific outcome was guaranteed or implied.  Informed consent was obtained.  Adreanna Fickel A., MD 07/01/2016, 11:40 AM

## 2016-07-02 ENCOUNTER — Encounter (HOSPITAL_BASED_OUTPATIENT_CLINIC_OR_DEPARTMENT_OTHER): Payer: Self-pay | Admitting: *Deleted

## 2016-07-04 ENCOUNTER — Other Ambulatory Visit: Payer: Self-pay | Admitting: Pediatrics

## 2016-07-05 ENCOUNTER — Encounter (HOSPITAL_BASED_OUTPATIENT_CLINIC_OR_DEPARTMENT_OTHER): Payer: Self-pay | Admitting: *Deleted

## 2016-07-05 ENCOUNTER — Ambulatory Visit: Payer: BLUE CROSS/BLUE SHIELD | Admitting: Neurology

## 2016-07-05 DIAGNOSIS — J340 Abscess, furuncle and carbuncle of nose: Secondary | ICD-10-CM | POA: Diagnosis not present

## 2016-07-05 DIAGNOSIS — J3489 Other specified disorders of nose and nasal sinuses: Secondary | ICD-10-CM | POA: Diagnosis not present

## 2016-07-05 DIAGNOSIS — B309 Viral conjunctivitis, unspecified: Secondary | ICD-10-CM | POA: Diagnosis not present

## 2016-07-06 ENCOUNTER — Ambulatory Visit (HOSPITAL_BASED_OUTPATIENT_CLINIC_OR_DEPARTMENT_OTHER): Payer: BLUE CROSS/BLUE SHIELD | Admitting: Anesthesiology

## 2016-07-06 ENCOUNTER — Encounter (HOSPITAL_BASED_OUTPATIENT_CLINIC_OR_DEPARTMENT_OTHER): Payer: Self-pay | Admitting: Anesthesiology

## 2016-07-06 ENCOUNTER — Encounter (HOSPITAL_BASED_OUTPATIENT_CLINIC_OR_DEPARTMENT_OTHER)
Admission: RE | Disposition: A | Discharge status: Home / Self Care | Payer: Self-pay | Source: Ambulatory Visit | Attending: Orthopedic Surgery

## 2016-07-06 ENCOUNTER — Ambulatory Visit (HOSPITAL_BASED_OUTPATIENT_CLINIC_OR_DEPARTMENT_OTHER)
Admission: RE | Admit: 2016-07-06 | Discharge: 2016-07-06 | Disposition: A | Discharge status: Home / Self Care | Payer: BLUE CROSS/BLUE SHIELD | Source: Ambulatory Visit | Attending: Orthopedic Surgery | Admitting: Orthopedic Surgery

## 2016-07-06 DIAGNOSIS — Z885 Allergy status to narcotic agent status: Secondary | ICD-10-CM | POA: Insufficient documentation

## 2016-07-06 DIAGNOSIS — Z85828 Personal history of other malignant neoplasm of skin: Secondary | ICD-10-CM | POA: Diagnosis not present

## 2016-07-06 DIAGNOSIS — F17200 Nicotine dependence, unspecified, uncomplicated: Secondary | ICD-10-CM | POA: Insufficient documentation

## 2016-07-06 DIAGNOSIS — F419 Anxiety disorder, unspecified: Secondary | ICD-10-CM | POA: Diagnosis not present

## 2016-07-06 DIAGNOSIS — M65312 Trigger thumb, left thumb: Secondary | ICD-10-CM | POA: Diagnosis not present

## 2016-07-06 DIAGNOSIS — J45909 Unspecified asthma, uncomplicated: Secondary | ICD-10-CM | POA: Insufficient documentation

## 2016-07-06 HISTORY — PX: TRIGGER FINGER RELEASE: SHX641

## 2016-07-06 SURGERY — RELEASE, A1 PULLEY, FOR TRIGGER FINGER
Anesthesia: General | Site: Thumb | Laterality: Left

## 2016-07-06 MED ORDER — 0.9 % SODIUM CHLORIDE (POUR BTL) OPTIME
TOPICAL | Status: DC | PRN
  Administered 2016-07-06: 200 mL

## 2016-07-06 MED ORDER — LACTATED RINGERS IV SOLN
INTRAVENOUS | Status: DC
  Administered 2016-07-06: 14:00:00 via INTRAVENOUS

## 2016-07-06 MED ORDER — PROMETHAZINE HCL 25 MG/ML IJ SOLN: 6.2500 mg | INTRAMUSCULAR | Status: DC | PRN

## 2016-07-06 MED ORDER — FENTANYL CITRATE (PF) 100 MCG/2ML IJ SOLN
INTRAMUSCULAR | Status: AC
  Filled 2016-07-06: qty 2

## 2016-07-06 MED ORDER — PROPOFOL 10 MG/ML IV BOLUS
INTRAVENOUS | Status: DC | PRN
  Administered 2016-07-06: 200 mg via INTRAVENOUS

## 2016-07-06 MED ORDER — FENTANYL CITRATE (PF) 100 MCG/2ML IJ SOLN
50.0000 ug | INTRAMUSCULAR | Status: DC | PRN
  Administered 2016-07-06: 100 ug via INTRAVENOUS

## 2016-07-06 MED ORDER — HYDROMORPHONE HCL 1 MG/ML IJ SOLN
0.2500 mg | INTRAMUSCULAR | Status: DC | PRN
Start: 2016-07-06 — End: 2016-07-06

## 2016-07-06 MED ORDER — ONDANSETRON HCL 4 MG/2ML IJ SOLN
INTRAMUSCULAR | Status: DC | PRN
  Administered 2016-07-06: 4 mg via INTRAVENOUS

## 2016-07-06 MED ORDER — MIDAZOLAM HCL 2 MG/2ML IJ SOLN
INTRAMUSCULAR | Status: AC
  Filled 2016-07-06: qty 2

## 2016-07-06 MED ORDER — BUPIVACAINE-EPINEPHRINE 0.5% -1:200000 IJ SOLN
INTRAMUSCULAR | Status: DC | PRN
  Administered 2016-07-06: 7 mL

## 2016-07-06 MED ORDER — ONDANSETRON HCL 4 MG/2ML IJ SOLN
INTRAMUSCULAR | Status: AC
  Filled 2016-07-06: qty 2

## 2016-07-06 MED ORDER — DEXAMETHASONE SODIUM PHOSPHATE 4 MG/ML IJ SOLN
INTRAMUSCULAR | Status: DC | PRN
Start: 2016-07-06 — End: 2016-07-06
  Administered 2016-07-06: 10 mg via INTRAVENOUS

## 2016-07-06 MED ORDER — LIDOCAINE 2% (20 MG/ML) 5 ML SYRINGE
INTRAMUSCULAR | Status: DC | PRN
  Administered 2016-07-06: 60 mg via INTRAVENOUS

## 2016-07-06 MED ORDER — CEFAZOLIN SODIUM-DEXTROSE 2-4 GM/100ML-% IV SOLN
2.0000 g | INTRAVENOUS | Status: AC
  Administered 2016-07-06: 2 g via INTRAVENOUS

## 2016-07-06 MED ORDER — SCOPOLAMINE 1 MG/3DAYS TD PT72: 1.0000 | MEDICATED_PATCH | Freq: Once | TRANSDERMAL | Status: DC | PRN

## 2016-07-06 MED ORDER — DIPHENHYDRAMINE HCL 50 MG/ML IJ SOLN
INTRAMUSCULAR | Status: DC | PRN
  Administered 2016-07-06: 25 mg via INTRAVENOUS

## 2016-07-06 MED ORDER — DEXAMETHASONE SODIUM PHOSPHATE 10 MG/ML IJ SOLN
INTRAMUSCULAR | Status: AC
  Filled 2016-07-06: qty 1

## 2016-07-06 MED ORDER — LIDOCAINE 2% (20 MG/ML) 5 ML SYRINGE
INTRAMUSCULAR | Status: AC
  Filled 2016-07-06: qty 5

## 2016-07-06 MED ORDER — MIDAZOLAM HCL 2 MG/2ML IJ SOLN
1.0000 mg | INTRAMUSCULAR | Status: DC | PRN
  Administered 2016-07-06: 2 mg via INTRAVENOUS

## 2016-07-06 MED ORDER — PROPOFOL 10 MG/ML IV BOLUS
INTRAVENOUS | Status: AC
  Filled 2016-07-06: qty 20

## 2016-07-06 MED ORDER — ACETAMINOPHEN 325 MG PO TABS: 650.0000 mg | ORAL_TABLET | Freq: Four times a day (QID) | ORAL | Status: DC | PRN

## 2016-07-06 MED ORDER — CEFAZOLIN SODIUM-DEXTROSE 2-4 GM/100ML-% IV SOLN
INTRAVENOUS | Status: AC
  Filled 2016-07-06: qty 100

## 2016-07-06 MED ORDER — LACTATED RINGERS IV SOLN
INTRAVENOUS | Status: DC
  Administered 2016-07-06: 13:00:00 via INTRAVENOUS

## 2016-07-06 SURGICAL SUPPLY — 39 items
BLADE SURG 15 STRL LF DISP TIS (BLADE) ×1 IMPLANT
BLADE SURG 15 STRL SS (BLADE) ×1
BNDG COHESIVE 1X5 TAN STRL LF (GAUZE/BANDAGES/DRESSINGS) IMPLANT
BNDG CONFORM 2 STRL LF (GAUZE/BANDAGES/DRESSINGS) ×2 IMPLANT
BNDG ESMARK 4X9 LF (GAUZE/BANDAGES/DRESSINGS) ×2 IMPLANT
BNDG GAUZE ELAST 4 BULKY (GAUZE/BANDAGES/DRESSINGS) ×2 IMPLANT
CHLORAPREP W/TINT 26ML (MISCELLANEOUS) IMPLANT
COVER BACK TABLE 60X90IN (DRAPES) ×2 IMPLANT
COVER MAYO STAND STRL (DRAPES) ×2 IMPLANT
CUFF TOURNIQUET SINGLE 18IN (TOURNIQUET CUFF) ×2 IMPLANT
DRAPE EXTREMITY T 121X128X90 (DRAPE) ×2 IMPLANT
DRAPE SURG 17X23 STRL (DRAPES) ×2 IMPLANT
DRSG EMULSION OIL 3X3 NADH (GAUZE/BANDAGES/DRESSINGS) ×2 IMPLANT
GAUZE SPONGE 4X4 12PLY STRL LF (GAUZE/BANDAGES/DRESSINGS) ×2 IMPLANT
GLOVE BIO SURGEON STRL SZ7.5 (GLOVE) IMPLANT
GLOVE BIOGEL PI IND STRL 7.0 (GLOVE) ×1 IMPLANT
GLOVE BIOGEL PI IND STRL 8 (GLOVE) ×1 IMPLANT
GLOVE BIOGEL PI INDICATOR 7.0 (GLOVE) ×1
GLOVE BIOGEL PI INDICATOR 8 (GLOVE) ×1
GLOVE ECLIPSE 6.5 STRL STRAW (GLOVE) IMPLANT
GLOVE SURG SS PI 6.5 STRL IVOR (GLOVE) ×2 IMPLANT
GLOVE SURG SS PI 7.0 STRL IVOR (GLOVE) ×2 IMPLANT
GLOVE SURG SS PI 7.5 STRL IVOR (GLOVE) ×2 IMPLANT
GOWN STRL REUS W/ TWL LRG LVL3 (GOWN DISPOSABLE) ×2 IMPLANT
GOWN STRL REUS W/TWL LRG LVL3 (GOWN DISPOSABLE) ×2
GOWN STRL REUS W/TWL XL LVL3 (GOWN DISPOSABLE) ×2 IMPLANT
NDL SAFETY ECLIPSE 18X1.5 (NEEDLE) IMPLANT
NEEDLE HYPO 18GX1.5 SHARP (NEEDLE)
NEEDLE HYPO 25X1 1.5 SAFETY (NEEDLE) ×2 IMPLANT
NS IRRIG 1000ML POUR BTL (IV SOLUTION) ×2 IMPLANT
PACK BASIN DAY SURGERY FS (CUSTOM PROCEDURE TRAY) ×2 IMPLANT
STOCKINETTE 6  STRL (DRAPES) ×1
STOCKINETTE 6 STRL (DRAPES) ×1 IMPLANT
SUT VICRYL RAPIDE 4/0 PS 2 (SUTURE) ×2 IMPLANT
SYR 10ML LL (SYRINGE) ×2 IMPLANT
SYR BULB 3OZ (MISCELLANEOUS) ×2 IMPLANT
TOWEL OR 17X24 6PK STRL BLUE (TOWEL DISPOSABLE) ×2 IMPLANT
TOWEL OR NON WOVEN STRL DISP B (DISPOSABLE) IMPLANT
UNDERPAD 30X30 (UNDERPADS AND DIAPERS) ×2 IMPLANT

## 2016-07-06 NOTE — Anesthesia Procedure Notes (Signed)
Procedure Name: LMA Insertion Date/Time: 07/06/2016 1:42 PM Performed by: Lyndee Leo Pre-anesthesia Checklist: Patient identified, Emergency Drugs available, Suction available and Patient being monitored Patient Re-evaluated:Patient Re-evaluated prior to inductionOxygen Delivery Method: Circle system utilized Preoxygenation: Pre-oxygenation with 100% oxygen Intubation Type: IV induction Ventilation: Mask ventilation without difficulty LMA: LMA inserted LMA Size: 3.0 Number of attempts: 1 Airway Equipment and Method: Bite block Placement Confirmation: positive ETCO2 Tube secured with: Tape Dental Injury: Teeth and Oropharynx as per pre-operative assessment

## 2016-07-06 NOTE — Interval H&P Note (Signed)
History and Physical Interval Note:  07/06/2016 1:25 PM  Kari Green  has presented today for surgery, with the diagnosis of LEFT TRIGGER THUMB M65.312  The various methods of treatment have been discussed with the patient and family. After consideration of risks, benefits and other options for treatment, the patient has consented to  Procedure(s): LEFT THUMB TRIGGER RELEASE (Left) as a surgical intervention .  The patient's history has been reviewed, patient examined, no change in status, stable for surgery.  I have reviewed the patient's chart and labs.  Questions were answered to the patient's satisfaction.     Aerin Delany A.

## 2016-07-06 NOTE — Anesthesia Postprocedure Evaluation (Signed)
Anesthesia Post Note  Patient: Kari Green  Procedure(s) Performed: Procedure(s) (LRB): LEFT THUMB TRIGGER RELEASE (Left)  Patient location during evaluation: PACU Anesthesia Type: General Level of consciousness: awake and alert Pain management: pain level controlled Vital Signs Assessment: post-procedure vital signs reviewed and stable Respiratory status: spontaneous breathing, nonlabored ventilation and respiratory function stable Cardiovascular status: blood pressure returned to baseline and stable Postop Assessment: no signs of nausea or vomiting Anesthetic complications: no       Last Vitals:  Vitals:   07/06/16 1445 07/06/16 1500  BP: 113/75 118/76  Pulse: 64 74  Resp: 12 16  Temp:  36.4 C    Last Pain:  Vitals:   07/06/16 1500  TempSrc:   PainSc: 0-No pain                 Lynda Rainwater

## 2016-07-06 NOTE — Anesthesia Preprocedure Evaluation (Signed)
Anesthesia Evaluation  Patient identified by MRN, date of birth, ID band Patient awake    Reviewed: Allergy & Precautions, NPO status , Patient's Chart, lab work & pertinent test results  Airway Mallampati: II  TM Distance: >3 FB Neck ROM: Full    Dental no notable dental hx.    Pulmonary neg pulmonary ROS, asthma , Current Smoker,    Pulmonary exam normal breath sounds clear to auscultation       Cardiovascular hypertension, Normal cardiovascular exam Rhythm:Regular Rate:Normal     Neuro/Psych negative neurological ROS  negative psych ROS   GI/Hepatic negative GI ROS, Neg liver ROS,   Endo/Other  negative endocrine ROSHypothyroidism   Renal/GU negative Renal ROS  negative genitourinary   Musculoskeletal negative musculoskeletal ROS (+)   Abdominal   Peds negative pediatric ROS (+)  Hematology negative hematology ROS (+)   Anesthesia Other Findings   Reproductive/Obstetrics negative OB ROS                             Anesthesia Physical  Anesthesia Plan  ASA: III  Anesthesia Plan: General   Post-op Pain Management:    Induction: Intravenous  Airway Management Planned: LMA  Additional Equipment:   Intra-op Plan:   Post-operative Plan: Extubation in OR  Informed Consent: I have reviewed the patients History and Physical, chart, labs and discussed the procedure including the risks, benefits and alternatives for the proposed anesthesia with the patient or authorized representative who has indicated his/her understanding and acceptance.   Dental advisory given  Plan Discussed with: CRNA and Surgeon  Anesthesia Plan Comments: (Benadryl and phenergan pre procedure IV is OK)        Anesthesia Quick Evaluation

## 2016-07-06 NOTE — Transfer of Care (Signed)
Immediate Anesthesia Transfer of Care Note  Patient: Kari Green  Procedure(s) Performed: Procedure(s): LEFT THUMB TRIGGER RELEASE (Left)  Patient Location: PACU  Anesthesia Type:General  Level of Consciousness: awake, sedated and patient cooperative  Airway & Oxygen Therapy: Patient Spontanous Breathing and Patient connected to face mask oxygen  Post-op Assessment: Report given to RN and Post -op Vital signs reviewed and stable  Post vital signs: Reviewed and stable  Last Vitals:  Vitals:   07/06/16 1235 07/06/16 1408  BP: 116/79 106/63  Pulse: 82 68  Resp: 20   Temp: 36.7 C     Last Pain:  Vitals:   07/06/16 1235  TempSrc: Oral  PainSc: 8          Complications: No apparent anesthesia complications

## 2016-07-06 NOTE — Discharge Instructions (Signed)
Discharge Instructions   You have a light dressing on your hand.  You may begin gentle motion of your fingers and hand immediately, but you should not do any heavy lifting or gripping.  Elevate your hand above your elbow to reduce pain & swelling of the digits.  Ice over the operative site or in the arm pit may be helpful to reduce pain & swelling.  DO NOT USE HEAT! Continue the pain regimen that you have been utilizing with your prescribed pain medicine. Additionally, add tylenol over the counter 625 mg every six hours. Leave the dressing in place until the third day after your surgery and then remove it, leaving it open to air.  After the bandage has been removed you may shower, regularly washing the incision and letting the water run over it, but not submerging it (no swimming, soaking it in dishwater, etc.) You may drive a car when you are off of prescription pain medications and can safely control your vehicle with both hands. We will address whether therapy will be required or not when you return to the office. You may have already made your follow-up appointment when we completed your preop visit.  If not, please call our office today or the next business day to make your return appointment for 10-15 days after surgery.   Please call [phone] during normal business hours or [phone] after hours for any problems. Including the following:  - excessive redness of the incisions - drainage for more than 4 days - fever of more than 101.5 F  *Please note that pain medications will not be refilled after hours or on weekends.       Post Anesthesia Home Care Instructions  Activity: Get plenty of rest for the remainder of the day. A responsible individual must stay with you for 24 hours following the procedure.  For the next 24 hours, DO NOT: -Drive a car -Paediatric nurse -Drink alcoholic beverages -Take any medication unless instructed by your physician -Make any legal  decisions or sign important papers.  Meals: Start with liquid foods such as gelatin or soup. Progress to regular foods as tolerated. Avoid greasy, spicy, heavy foods. If nausea and/or vomiting occur, drink only clear liquids until the nausea and/or vomiting subsides. Call your physician if vomiting continues.  Special Instructions/Symptoms: Your throat may feel dry or sore from the anesthesia or the breathing tube placed in your throat during surgery. If this causes discomfort, gargle with warm salt water. The discomfort should disappear within 24 hours.  If you had a scopolamine patch placed behind your ear for the management of post- operative nausea and/or vomiting:  1. The medication in the patch is effective for 72 hours, after which it should be removed.  Wrap patch in a tissue and discard in the trash. Wash hands thoroughly with soap and water. 2. You may remove the patch earlier than 72 hours if you experience unpleasant side effects which may include dry mouth, dizziness or visual disturbances. 3. Avoid touching the patch. Wash your hands with soap and water after contact with the patch.

## 2016-07-06 NOTE — Op Note (Signed)
07/06/2016  1:25 PM  PATIENT:  Kari Green  51 y.o. female  PRE-OPERATIVE DIAGNOSIS:  LEFT TRIGGER THUMB M65.312  POST-OPERATIVE DIAGNOSIS:  Same  PROCEDURE:  Procedure(s): LEFT THUMB TRIGGER RELEASE  SURGEON:  Surgeon(s): Milly Jakob, MD  PHYSICIAN ASSISTANT: Morley Kos, OPA-C  ANESTHESIA:  General  SPECIMENS:  None  DRAINS:   None  EBL:  less than 50 mL  PREOPERATIVE INDICATIONS:  [name] is a  51 y.o. female with a diagnosis of LEFT TRIGGER THUMB M65.312 who failed conservative measures and elected for surgical management.    The risks benefits and alternatives were discussed with the patient preoperatively including but not limited to the risks of infection, bleeding, nerve injury, cardiopulmonary complications, the need for revision surgery, among others, and the patient verbalized understanding and consented to proceed.  OPERATIVE IMPLANTS: None  OPERATIVE FINDINGS:  See Below  OPERATIVE PROCEDURE:  After receiving prophylactic antibiotics, the patient was escorted to the operative theatre and placed in a supine position.   General anesthesia was administered.  A surgical "time-out" was performed during which the planned procedure, proposed operative site, and the correct patient identity were compared to the operative consent and agreement confirmed by the circulating nurse according to current facility policy.  Following application of a tourniquet to the operative extremity, the exposed skin was prepped with Chloraprep and draped in the usual sterile fashion.  The limb was exsanguinated with an Esmarch bandage and the tourniquet inflated to approximately 163mmHg higher than systolic BP.  An oblique incision was made over the A1 pulley of the affected digit.  Subcutaneous taste tissues were dissected with blunt and spreading dissection to reveal an underlying flexor tendon sheath and A1 pulley.  With the neurovascular structures protected, the  A1 pulley was split in the midline under direct visualization with loupe assistance.  Some crossing bands proximal to the A1 pulley were also released.  The tendons were pulled into view, cleaned of thickened synovium and return to their bed.  Digital block was performed with a mixture of marcaine/epi.  The wound was irrigated, tourniquet released, and skin closed with 4-0 Vicryl Rapide.  A light dressing was applied and the patient was taken to the recovery room.  DISPOSITION: The patient will be discharged home today with typical instructions, returning in 10-15 days.

## 2016-07-07 ENCOUNTER — Encounter (HOSPITAL_BASED_OUTPATIENT_CLINIC_OR_DEPARTMENT_OTHER): Payer: Self-pay | Admitting: Orthopedic Surgery

## 2016-07-12 DIAGNOSIS — G43901 Migraine, unspecified, not intractable, with status migrainosus: Secondary | ICD-10-CM | POA: Diagnosis not present

## 2016-07-12 DIAGNOSIS — E538 Deficiency of other specified B group vitamins: Secondary | ICD-10-CM | POA: Diagnosis not present

## 2016-07-12 DIAGNOSIS — T63301A Toxic effect of unspecified spider venom, accidental (unintentional), initial encounter: Secondary | ICD-10-CM | POA: Diagnosis not present

## 2016-07-12 DIAGNOSIS — M8700 Idiopathic aseptic necrosis of unspecified bone: Secondary | ICD-10-CM | POA: Diagnosis not present

## 2016-07-12 IMAGING — CT CT ABD-PELV W/O CM
2 of 4 series · 16 of 46 positions shown, 18 images · non-contrast
Comparison: 03/14/2015.

CLINICAL DATA: Lower abdominal pain. Bilateral flank pain.
Bilateral lower extremity pain for 1 year. Intermittent hematuria
for the past 2 years. Bladder distention procedure approximately 4
months ago. The patient is concerned about the possibility of
bladder cancer.

EXAM:
CT ABDOMEN AND PELVIS WITHOUT CONTRAST
TECHNIQUE: Multidetector CT imaging of the abdomen and pelvis was performed
following the standard protocol without IV contrast.

[Series 2: abd/pel w/o · axial · non-contrast · 0.67mm/px · z∈[+767,+1177]mm · 13 of 92 slices shown, 15 images]
[im 5/92  soft-tissue]
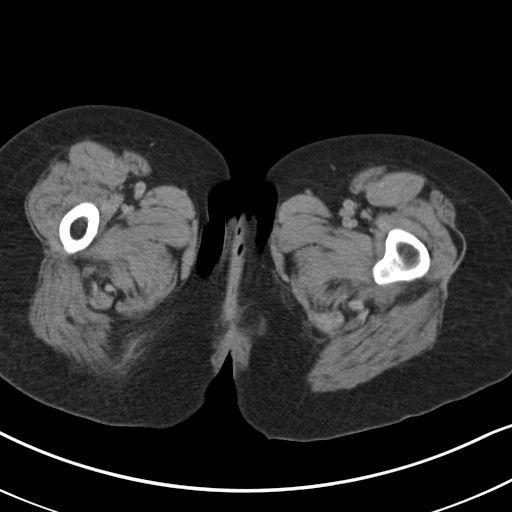
[im 5/92  bone]
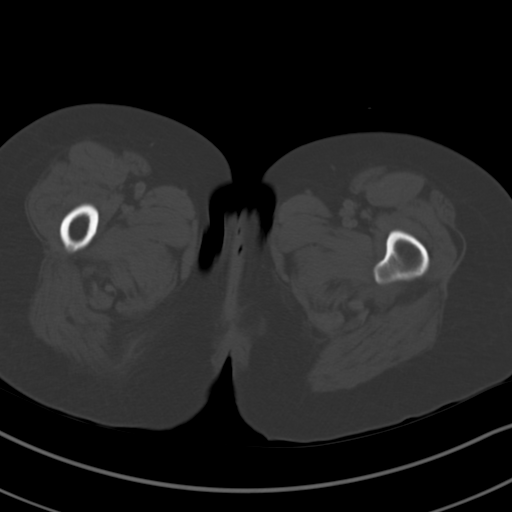
[im 14/92  soft-tissue]
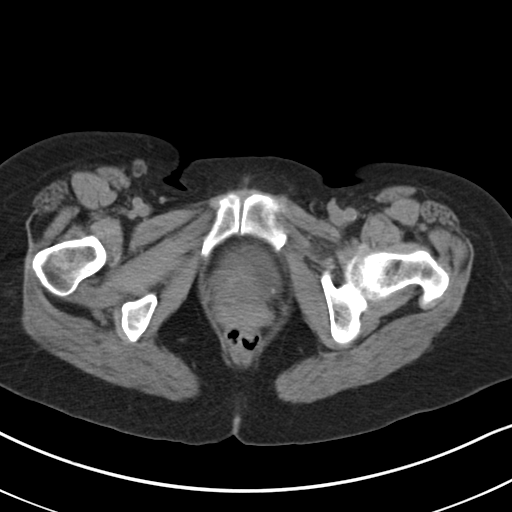
[im 18/92  soft-tissue]
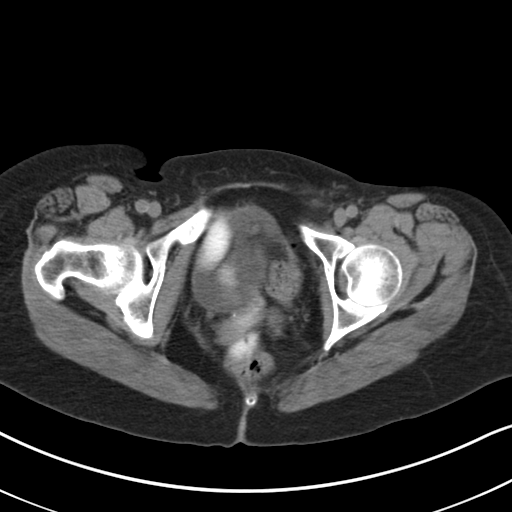
[im 27/92  soft-tissue]
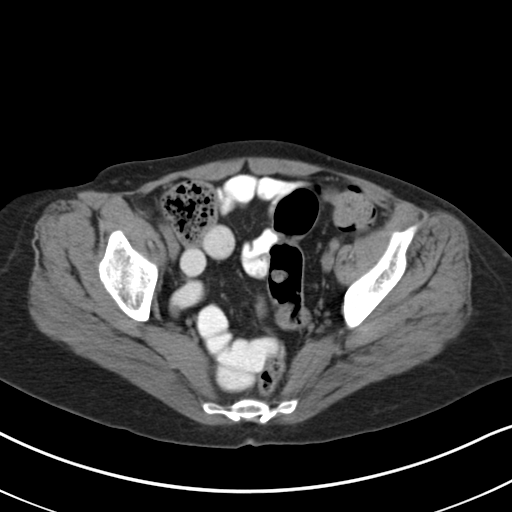
[im 31/92  soft-tissue]
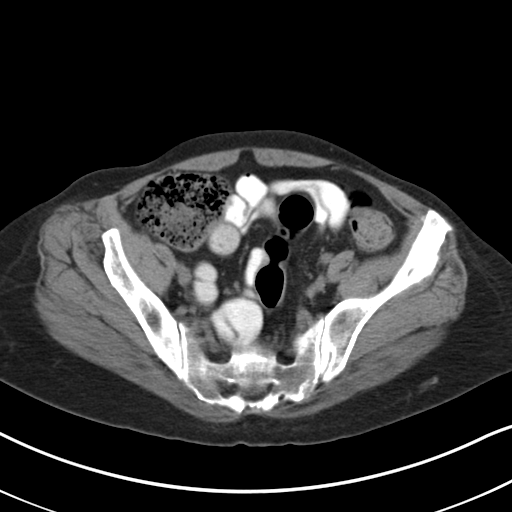
[im 40/92  soft-tissue]
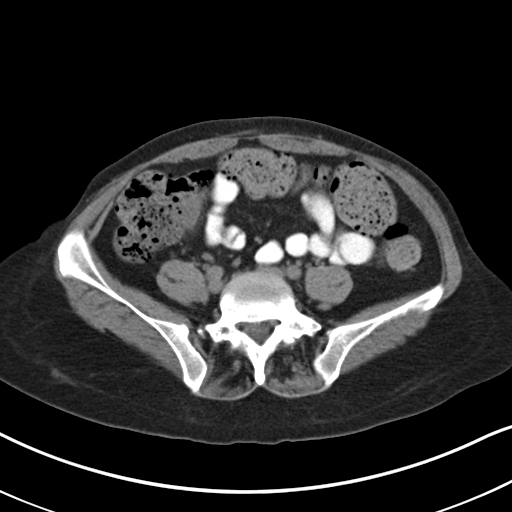
[im 48/92  soft-tissue]
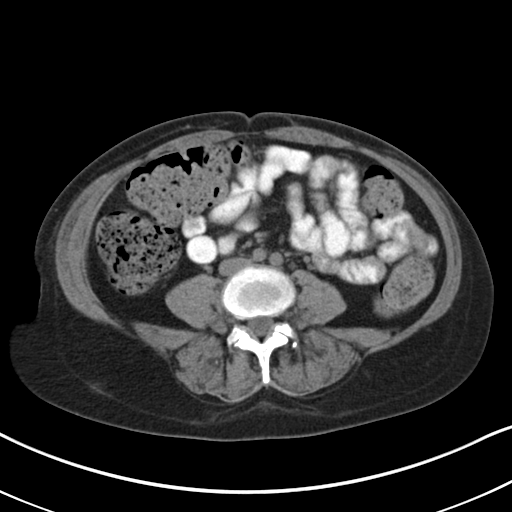
[im 53/92  soft-tissue]
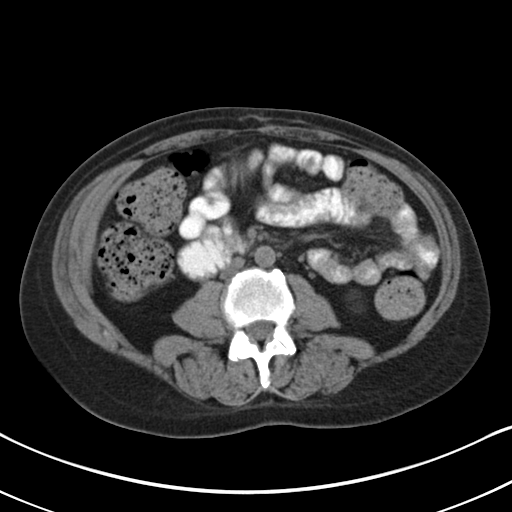
[im 61/92  soft-tissue]
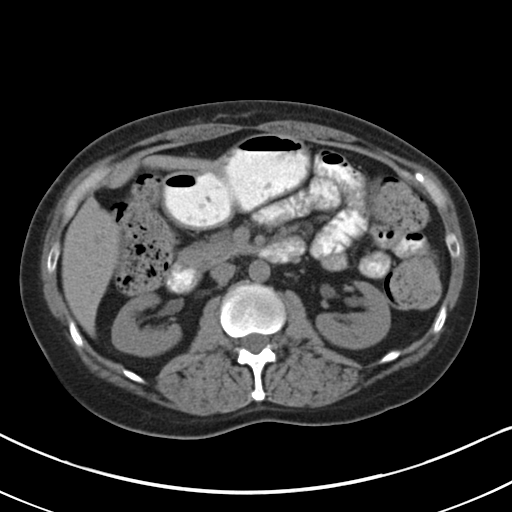
[im 61/92  bone]
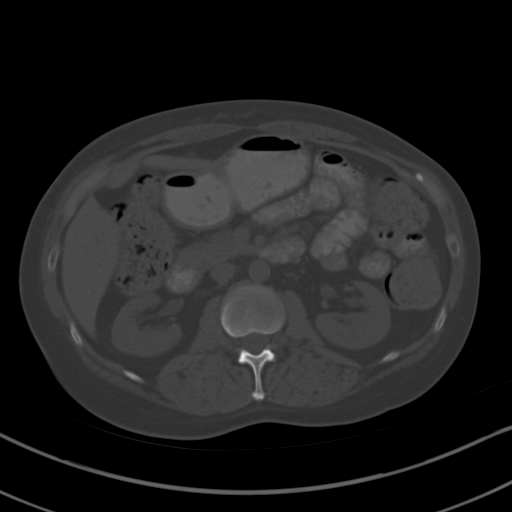
[im 66/92  soft-tissue]
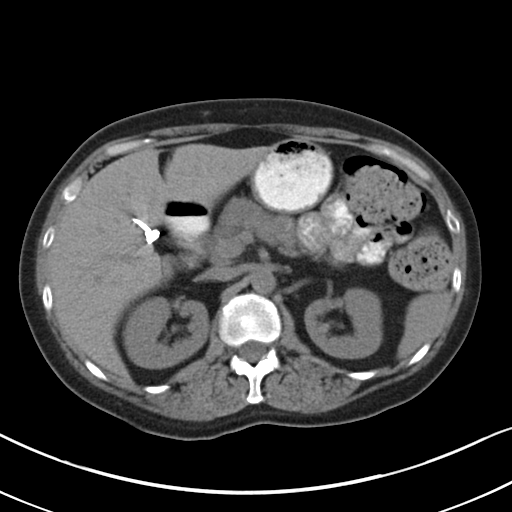
[im 74/92  soft-tissue]
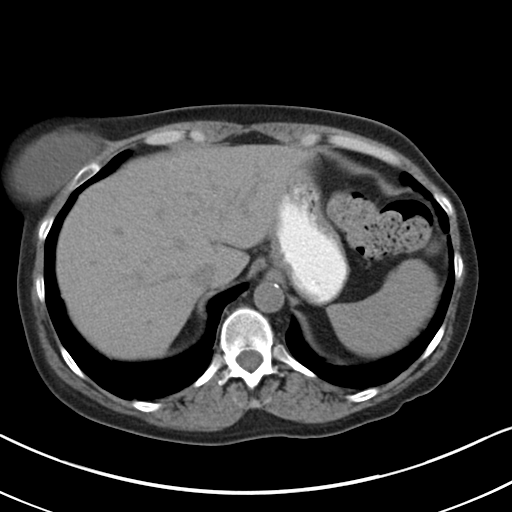
[im 79/92  soft-tissue]
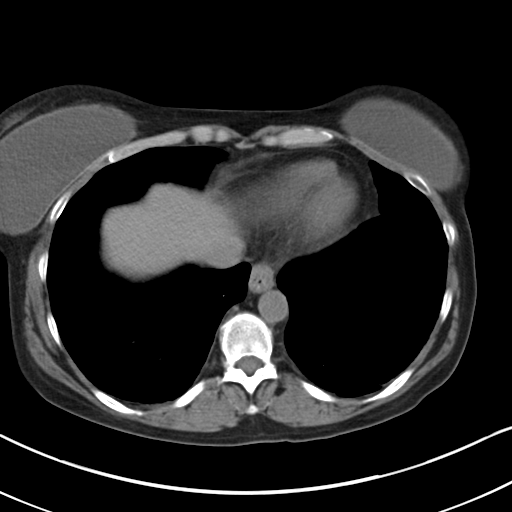
[im 87/92  soft-tissue]
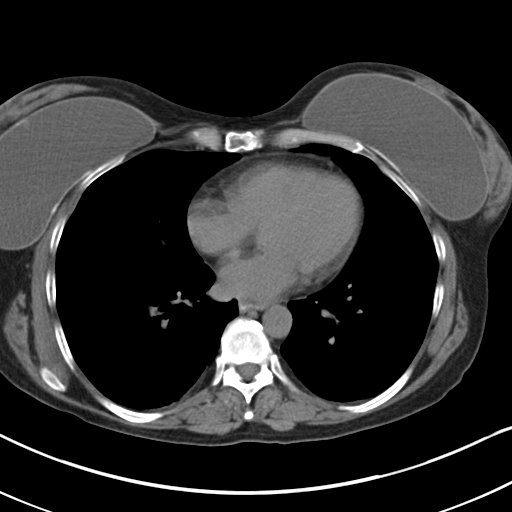

[Series 5: coronal · coronal · 0.64mm/px · 3 of 121 slices shown]
[im 41/121  soft-tissue]
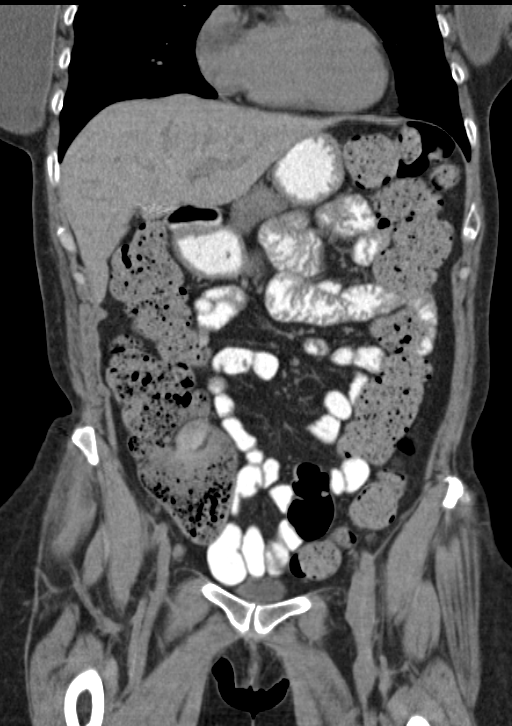
[im 54/121  soft-tissue]
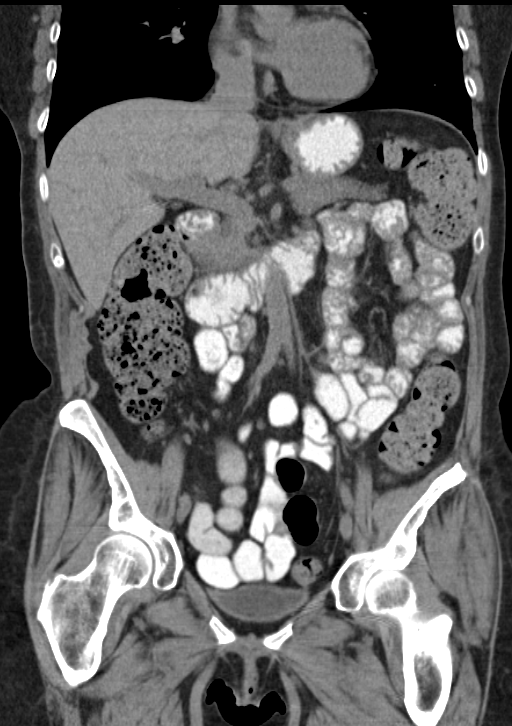
[im 67/121  soft-tissue]
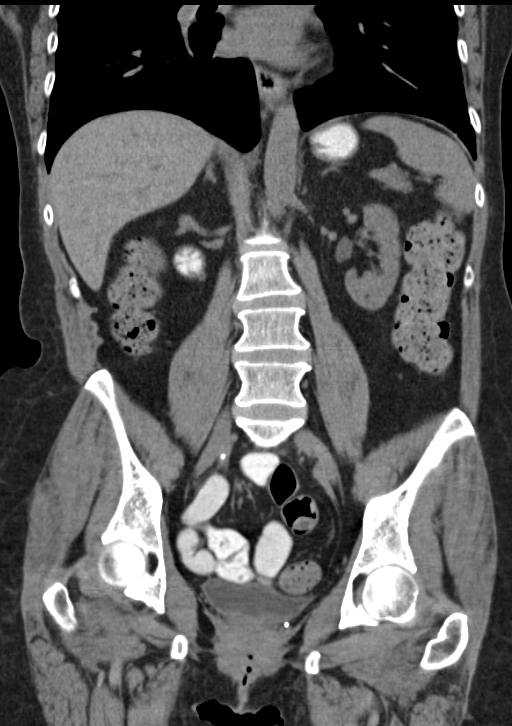

[16 of 46 positions shown; findings below may reference images not displayed]

FINDINGS: Lower chest: Bilateral breast implants. Linear atelectasis and
scarring at both lung bases.

Hepatobiliary: Unremarkable liver.  Cholecystectomy clips.

Pancreas: No mass or inflammatory process identified on this
un-enhanced exam.

Spleen: Within normal limits in size and appearance.

Adrenals/Urinary Tract: 4 mm upper pole right renal calculus.
Otherwise, normal appearing kidneys, ureters and urinary bladder. No
visible bladder mass. Normal appearing adrenal glands.

Stomach/Bowel: Prominent stool throughout the majority of the colon.
No gastric or small bowel abnormalities. Surgically absent appendix.

Vascular/Lymphatic: No pathologically enlarged lymph nodes. No
evidence of abdominal aortic aneurysm.

Reproductive: Surgically absent uterus.  No adnexal masses.

Other: None.

Musculoskeletal:  Mild thoracic spine degenerative changes
IMPRESSION: 1. 4 mm nonobstructing upper pole right renal calculus.
2. Prominent stool throughout the majority of the colon.
3. No acute abnormality.

## 2016-07-15 DIAGNOSIS — M79601 Pain in right arm: Secondary | ICD-10-CM | POA: Diagnosis not present

## 2016-07-15 DIAGNOSIS — L03119 Cellulitis of unspecified part of limb: Secondary | ICD-10-CM | POA: Diagnosis not present

## 2016-07-20 ENCOUNTER — Ambulatory Visit: Payer: BLUE CROSS/BLUE SHIELD | Admitting: Neurology

## 2016-07-21 ENCOUNTER — Ambulatory Visit
Admission: RE | Admit: 2016-07-21 | Discharge: 2016-07-21 | Disposition: A | Discharge status: Home / Self Care | Payer: BLUE CROSS/BLUE SHIELD | Source: Ambulatory Visit | Attending: Pain Medicine | Admitting: Pain Medicine

## 2016-07-21 ENCOUNTER — Encounter: Payer: Self-pay | Admitting: Pain Medicine

## 2016-07-21 ENCOUNTER — Ambulatory Visit (HOSPITAL_BASED_OUTPATIENT_CLINIC_OR_DEPARTMENT_OTHER): Payer: BLUE CROSS/BLUE SHIELD | Admitting: Pain Medicine

## 2016-07-21 VITALS — BP 127/70 | HR 65 | Temp 97.7°F | Resp 16 | Ht 63.0 in | Wt 137.0 lb

## 2016-07-21 DIAGNOSIS — G90521 Complex regional pain syndrome I of right lower limb: Secondary | ICD-10-CM

## 2016-07-21 DIAGNOSIS — Z96651 Presence of right artificial knee joint: Secondary | ICD-10-CM

## 2016-07-21 DIAGNOSIS — G8929 Other chronic pain: Secondary | ICD-10-CM

## 2016-07-21 DIAGNOSIS — G8918 Other acute postprocedural pain: Secondary | ICD-10-CM

## 2016-07-21 DIAGNOSIS — M25561 Pain in right knee: Secondary | ICD-10-CM | POA: Diagnosis not present

## 2016-07-21 DIAGNOSIS — A630 Anogenital (venereal) warts: Secondary | ICD-10-CM | POA: Insufficient documentation

## 2016-07-21 DIAGNOSIS — N898 Other specified noninflammatory disorders of vagina: Secondary | ICD-10-CM | POA: Insufficient documentation

## 2016-07-21 MED ORDER — ROPIVACAINE HCL 2 MG/ML IJ SOLN
INTRAMUSCULAR | Status: AC
  Filled 2016-07-21: qty 10

## 2016-07-21 MED ORDER — CEFAZOLIN SODIUM-DEXTROSE 1-4 GM/50ML-% IV SOLN
1.0000 g | Freq: Once | INTRAVENOUS | Status: AC
  Administered 2016-07-21: 1 g via INTRAVENOUS

## 2016-07-21 MED ORDER — MIDAZOLAM HCL 5 MG/5ML IJ SOLN
INTRAMUSCULAR | Status: AC
  Filled 2016-07-21: qty 5

## 2016-07-21 MED ORDER — SODIUM CHLORIDE 0.9 % IJ SOLN
INTRAMUSCULAR | Status: AC
  Filled 2016-07-21: qty 10

## 2016-07-21 MED ORDER — LACTATED RINGERS IV SOLN
1000.0000 mL | Freq: Once | INTRAVENOUS | Status: AC
  Administered 2016-07-21: 1000 mL via INTRAVENOUS

## 2016-07-21 MED ORDER — MIDAZOLAM HCL 5 MG/5ML IJ SOLN
1.0000 mg | INTRAMUSCULAR | Status: DC | PRN
  Administered 2016-07-21: 4 mg via INTRAVENOUS

## 2016-07-21 MED ORDER — CEFAZOLIN SODIUM 1 G IJ SOLR
INTRAMUSCULAR | Status: AC
  Filled 2016-07-21: qty 10

## 2016-07-21 MED ORDER — FENTANYL CITRATE (PF) 100 MCG/2ML IJ SOLN
25.0000 ug | INTRAMUSCULAR | Status: DC | PRN
  Administered 2016-07-21: 100 ug via INTRAVENOUS

## 2016-07-21 MED ORDER — METHYLPREDNISOLONE ACETATE 80 MG/ML IJ SUSP
80.0000 mg | Freq: Once | INTRAMUSCULAR | Status: AC
  Administered 2016-07-21: 80 mg

## 2016-07-21 MED ORDER — LIDOCAINE HCL (PF) 1 % IJ SOLN
INTRAMUSCULAR | Status: AC
  Filled 2016-07-21: qty 5

## 2016-07-21 MED ORDER — OXYCODONE HCL 5 MG PO TABS: 5.0000 mg | ORAL_TABLET | Freq: Four times a day (QID) | ORAL | 0 refills | Status: DC | PRN

## 2016-07-21 MED ORDER — PROMETHAZINE HCL 25 MG/ML IJ SOLN
25.0000 mg | INTRAMUSCULAR | Status: DC | PRN
  Administered 2016-07-21: 25 mg via INTRAVENOUS
  Filled 2016-07-21 (×2): qty 1

## 2016-07-21 MED ORDER — DIPHENHYDRAMINE HCL 50 MG/ML IJ SOLN
INTRAMUSCULAR | Status: AC
  Filled 2016-07-21: qty 1

## 2016-07-21 MED ORDER — METHYLPREDNISOLONE ACETATE 80 MG/ML IJ SUSP
INTRAMUSCULAR | Status: AC
  Filled 2016-07-21: qty 1

## 2016-07-21 MED ORDER — DIPHENHYDRAMINE HCL 50 MG/ML IJ SOLN
25.0000 mg | INTRAMUSCULAR | Status: DC
  Administered 2016-07-21: 50 mg via INTRAVENOUS

## 2016-07-21 MED ORDER — FENTANYL CITRATE (PF) 100 MCG/2ML IJ SOLN
INTRAMUSCULAR | Status: AC
  Filled 2016-07-21: qty 2

## 2016-07-21 MED ORDER — LIDOCAINE HCL (PF) 1 % IJ SOLN
10.0000 mL | Freq: Once | INTRAMUSCULAR | Status: AC
  Administered 2016-07-21: 5 mL

## 2016-07-21 MED ORDER — ROPIVACAINE HCL 2 MG/ML IJ SOLN
9.0000 mL | Freq: Once | INTRAMUSCULAR | Status: AC
  Administered 2016-07-21: 10 mL

## 2016-07-21 NOTE — Progress Notes (Addendum)
Patient's Name: Kari Green  MRN: [id number]  Referring Provider: Milinda Pointer, MD  DOB: Jul 10, 1965  PCP: Thressa Sheller, MD  DOS: 07/21/2016  Note by: Kathlen Brunswick. Dossie Arbour, MD  Service setting: Ambulatory outpatient  Location: [facility] (AMB) Pain Management Facility  Visit type: Procedure  Specialty: Interventional Pain Management  Patient type: Established   Primary Reason for Visit: Interventional Pain Management Treatment. CC: Knee Pain (right)  Procedure:  Anesthesia, Analgesia, Anxiolysis:  Type: Therapeutic Superior-lateral, Superior-medial, and Inferior-medial, Genicular Nerve Radiofrequency Ablation. Region: Lateral, Anterior, and Medial aspects of the knee joint, above and below the knee joint proper. Level: Superior and inferior to the knee joint. Laterality: Right  Type: Local Anesthesia with Moderate (Conscious) Sedation Local Anesthetic: Lidocaine 1% Route: Intravenous (IV) IV Access: Secured Sedation: Meaningful verbal contact was maintained at all times during the procedure  Indication(s): Analgesia and Anxiety  Indications: 1. 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 Chronic knee pain (Location of Secondary source of pain) (Right)   3. Complex regional pain syndrome type 1 of right lower extremity   4. Acute postoperative pain   5. History of arthroplasty of right knee   6. Acute Pain following procedure (Radiofrequency Ablation)    [name] has either failed to respond, was unable to tolerate, or simply did not get enough benefit from other more conservative therapies including, but not limited to: 1. Over-the-counter medications 2. Anti-inflammatory medications 3. Muscle relaxants 4. Membrane stabilizers 5. Opioids 6. Physical therapy 7. Modalities (Heat, ice, etc.) 8. Invasive techniques such as nerve blocks. [name] has attained more than 50% relief of the pain from a series of diagnostic injections conducted in  separate occasions.  Pain Score: Pre-procedure: 9 /10 Post-procedure: 0-No pain/10  Pre-op Assessment:  Previous date of service: 04/12/16 Service provided: Procedure (right LUmbar sympathetic RF) [name] is a 51 y.o. (year old), female patient, seen today for interventional treatment. She  has a past surgical history that includes Total thyroidectomy (1993); cysto with hydrodistension (01/29/2011); Cystoscopy w/ retrogrades (01/29/2011); Fissurectomy (~ 1988 x2); Refractive surgery (Bilateral, ~ 1999; ~ 2013); Nasal sinus surgery (2013); Laparoscopic lysis of adhesions (02/11/2012); Excision haglund's deformity with achilles tendon repair (Bilateral, 2013-2014); Melanoma excision (Left, 2013); Cystoscopy with stent placement (2006); Appendectomy (~ 1992); Tonsillectomy and adenoidectomy (~ 1989); LAPAROSCOPY ADHESIOLYSIS AND REMOVAL POSSIBLE RIGHT OVARY REMNANT (04/18/2000); Knee arthroscopy (Right, 08/16/2000); Carpal tunnel release (Right, 07/26/2001); DEBRIDEMENT RIGHT THUMB MP JOINT (Right, 04/10/2002); RIGHT THUMB FUSION OF MPJ (Right, 10/03/2002); DECOMPRESSION LEFT ULNAR NERVE, ELBOW (Left, 01/29/2003); RIGHT TOTAL KNEE REVISION ARTHROPLASTY (Right, 03/14/2007); CYSTO/ URETHRAL DILATION/ HYDRODISTENTION/ BLADDER BX (04/10/2003); Laparoscopic assisted vaginal hysterectomy (Feb 1999); LAPAROSCOPY LYSIS ADHESIONS/  BILATERAL SALPINGOOPHORECTOMY/  FULGERATION OF ENDOMETRIOSIS (1998); Anterior cervical decomp/discectomy fusion (03/10/2009); Esophagogastroduodenoscopy (egd) with esophageal dilation (10-26-2000); CYSTO/  BLADDER BX (03/08/2008); transthoracic echocardiogram (09/22/2012); cysto with hydrodistension (N/A, 05/08/2014); laparoscopy (02/11/2012); Transanal hemorrhoidal dearterialization (03/26/2011); Knee arthroscopy with fulkerson slide (Right, 11/29/2000); Knee arthroscopy w/ lateral release (Right, 11/29/2000); Laparoscopic cholecystectomy (03/04/2002); Total knee arthroplasty (Right, 09/10/2003;  12/08/2012); Carpal tunnel release (Left); Melanoma excision; Carpometacarpel (CMC) suspension plasty (Right, 02/10/2015); Ganglion cyst excision (Right, 02/10/2015); NEGATIVE SLEEP STUDY (2006 approx .  per pt); cysto with hydrodistension (N/A, 03/27/2015); Hand surgery (Right, 01-31-15); Abdominal hysterectomy; Hemorroidectomy; Kidney stone surgery; Shoulder surgery (Left); Anterior cervical decomp/discectomy fusion (N/A, 07/16/2015); Video bronchoscopy (Bilateral, 10/20/2015); Augmentation mammaplasty (Bilateral, 1996; 2001, 2016); Breast surgery (1980's); Eye surgery (Bilateral); Colonoscopy w/ polypectomy; Shoulder hemi-arthroplasty (Left, 12/04/2015); EUS (N/A,  04/23/2016); and Trigger finger release (Left, 07/06/2016). Her primarily concern today is the Knee Pain (right)  Initial Vital Signs: Blood pressure (!) 144/78, pulse 65, temperature 97.9 F (36.6 C), resp. rate 20, height 5\' 3"  (1.6 m), weight 137 lb (62.1 kg), SpO2 95 %. BMI: 24.27 kg/m  Risk Assessment: Allergies: Reviewed. She is allergic to cobalt; latex; nickel; peanuts [peanut oil]; red dye; azithromycin; chloraprep one step [chlorhexidine gluconate]; corticosteroids; flexeril [cyclobenzaprine]; morphine and related; other; percocet [oxycodone-acetaminophen]; supartz [sodium hyaluronate]; surgical lubricant; yellow dyes (non-tartrazine); cephalexin; monistat [miconazole]; prednisone; sulfa antibiotics; yellow dye; soap; and tape.  Allergy Precautions: Latex-free protocol activated Coagulopathies: Reviewed. None identified.  Blood-thinner therapy: None at this time Active Infection(s): Reviewed. None identified. [name] is afebrile  Site Confirmation: [name] was asked to confirm the procedure and laterality before marking the site Procedure checklist: Completed Consent: Before the procedure and under the influence of no sedative(s), amnesic(s), or anxiolytics, the patient was informed of the treatment options, risks and  possible complications. To fulfill our ethical and legal obligations, as recommended by the American Medical Association's Code of Ethics, I have informed the patient of my clinical impression; the nature and purpose of the treatment or procedure; the risks, benefits, and possible complications of the intervention; the alternatives, including doing nothing; the risk(s) and benefit(s) of the alternative treatment(s) or procedure(s); and the risk(s) and benefit(s) of doing nothing. The patient was provided information about the general risks and possible complications associated with the procedure. These may include, but are not limited to: failure to achieve desired goals, infection, bleeding, organ or nerve damage, allergic reactions, paralysis, and death. In addition, the patient was informed of those risks and complications associated to the procedure, such as failure to decrease pain; infection; bleeding; organ or nerve damage with subsequent damage to sensory, motor, and/or autonomic systems, resulting in permanent pain, numbness, and/or weakness of one or several areas of the body; allergic reactions; (i.e.: anaphylactic reaction); and/or death. Furthermore, the patient was informed of those risks and complications associated with the medications. These include, but are not limited to: allergic reactions (i.e.: anaphylactic or anaphylactoid reaction(s)); adrenal axis suppression; blood sugar elevation that in diabetics may result in ketoacidosis or comma; water retention that in patients with history of congestive heart failure may result in shortness of breath, pulmonary edema, and decompensation with resultant heart failure; weight gain; swelling or edema; medication-induced neural toxicity; particulate matter embolism and blood vessel occlusion with resultant organ, and/or nervous system infarction; and/or aseptic necrosis of one or more joints. Finally, the patient was informed that Medicine is not an  exact science; therefore, there is also the possibility of unforeseen or unpredictable risks and/or possible complications that may result in a catastrophic outcome. The patient indicated having understood very clearly. We have given the patient no guarantees and we have made no promises. Enough time was given to the patient to ask questions, all of which were answered to the patient's satisfaction. [name] has indicated that she wanted to continue with the procedure. Attestation: I, the ordering provider, attest that I have discussed with the patient the benefits, risks, side-effects, alternatives, likelihood of achieving goals, and potential problems during recovery for the procedure that I have provided informed consent. Date: 07/21/2016; Time: 3:04 PM  Pre-Procedure Preparation:  Monitoring: As per clinic protocol. Respiration, ETCO2, SpO2, BP, heart rate and rhythm monitor placed and checked for adequate function Safety Precautions: Patient was assessed for positional comfort and pressure points before starting  the procedure. Time-out: I initiated and conducted the "Time-out" before starting the procedure, as per protocol. The patient was asked to participate by confirming the accuracy of the "Time Out" information. Verification of the correct person, site, and procedure were performed and confirmed by me, the nursing staff, and the patient. "Time-out" conducted as per Joint Commission's Universal Protocol (UP.01.01.01). "Time-out" Date & Time: 07/21/2016; 1527 hrs.  Description of Procedure Process:   Position: Supine Target Area: For Genicular Nerve block(s), the targets are: the superior-lateral genicular nerve, located in the lateral distal portion of the femoral shaft as it curves to form the lateral epicondyle, in the region of the distal femoral metaphysis; the superior-medial genicular nerve, located in the medial distal portion of the femoral shaft as it curves to form the medial  epicondyle; and the inferior-medial genicular nerve, located in the medial, proximal portion of the tibial shaft, as it curves to form the medial epicondyle, in the region of the proximal tibial metaphysis. Approach: Anterior, ipsilateral approach. Area Prepped: Entire knee area, from mid-thigh to mid-shin, lateral, anterior, and medial aspects. Prepping solution: Hibiclens (4.0% Chlorhexidine gluconate solution) Safety Precautions: Aspiration looking for blood return was conducted prior to all injections. At no point did we inject any substances, as a needle was being advanced. No attempts were made at seeking any paresthesias. Safe injection practices and needle disposal techniques used. Medications properly checked for expiration dates. SDV (single dose vial) medications used. Description of the Procedure: Protocol guidelines were followed. The patient was placed in position over the fluoroscopy table. The target area was identified and the area prepped in the usual manner. Skin & deeper tissues infiltrated with local anesthetic. Appropriate amount of time allowed to pass for local anesthetics to take effect. Radiofrequency needles were introduced to the target area using fluoroscopy. Using the a Radiofrequency Generator, sensory stimulation using 50 Hz was used to locate & identify the nerve, making sure that the needle was positioned such that there was no sensory stimulation below 0.3 V or above 0.7 V. Stimulation using 2 Hz was used to evaluate the motor component. Care was taken not to lesion any nerves that demonstrated motor stimulation at an output of less than 2.5 times that of the sensory threshold, or a maximum of 2.0 V. Once satisfactory placement of the needles was achieved, the anesthetic was slowly injected, using intermittent aspiration to confirm negative flow of heme. After waiting for 2 minutes, the ablation was performed at 80 degrees C for 60 seconds.The needles were then removed and the  area cleansed, making sure to leave some of the prepping solution back to take advantage of its long term bactericidal properties. Vitals:   07/21/16 1608 07/21/16 1617 07/21/16 1627 07/21/16 1635  BP: 113/86 120/84 135/72 127/70  Pulse:      Resp: 20 13 12 16   Temp:  97.7 F (36.5 C)    TempSrc:  Temporal    SpO2: 98% 97% 98% 98%  Weight:      Height:        Start Time: 1530 hrs. End Time: 1606 hrs. Materials & Medications:  Needle(s) Type: Teflon-coated, curved tip, Radiofrequency needle(s) Gauge: 22G Length: 10cm Medication(s): We administered lactated ringers, midazolam, fentaNYL, methylPREDNISolone acetate, lidocaine (PF), ropivacaine (PF) 2 mg/mL (0.2%), ceFAZolin, promethazine, and diphenhydrAMINE. Please see chart orders for dosing details.  Imaging Guidance (Non-Spinal):  Type of Imaging Technique: Fluoroscopy Guidance (Non-Spinal) Indication(s): Assistance in needle guidance and placement for procedures requiring needle placement in or near  specific anatomical locations not easily accessible without such assistance. Exposure Time: Please see nurses notes. Contrast: Before injecting any contrast, we confirmed that the patient did not have an allergy to iodine, shellfish, or radiological contrast. Once satisfactory needle placement was completed at the desired level, radiological contrast was injected. Contrast injected under live fluoroscopy. No contrast complications. See chart for type and volume of contrast used. Fluoroscopic Guidance: I was personally present during the use of fluoroscopy. "Tunnel Vision Technique" used to obtain the best possible view of the target area. Parallax error corrected before commencing the procedure. "Direction-depth-direction" technique used to introduce the needle under continuous pulsed fluoroscopy. Once target was reached, antero-posterior, oblique, and lateral fluoroscopic projection used confirm needle placement in all planes. Images  permanently stored in EMR. Interpretation: I personally interpreted the imaging intraoperatively. Adequate needle placement confirmed in multiple planes. Appropriate spread of contrast into desired area was observed. No evidence of afferent or efferent intravascular uptake. Permanent images saved into the patient's record.  Antibiotic Prophylaxis:  Indication(s): None identified Antibiotic given: None  Post-operative Assessment:  EBL: None Complications: No immediate post-treatment complications observed by team, or reported by patient. Note: The patient tolerated the entire procedure well. A repeat set of vitals were taken after the procedure and the patient was kept under observation following institutional policy, for this type of procedure. Post-procedural neurological assessment was performed, showing return to baseline, prior to discharge. The patient was provided with post-procedure discharge instructions, including a section on how to identify potential problems. Should any problems arise concerning this procedure, the patient was given instructions to immediately contact us, at any time, without hesitation. In any case, we plan to contact the patient by telephone for a follow-up status report regarding this interventional procedure. Comments:  No additional relevant information.  Plan of Care  Disposition: Discharge home  Discharge Date & Time: 07/21/2016; 1640 hrs.  Physician-requested Follow-up:  Return for post-RF eval, (6 wks), by MD.  Future Appointments Date Time Provider [place]  07/22/2016 3:00 PM Melvenia Beam, MD [facility] None  09/02/2016 11:00 AM Milinda Pointer, MD [facility] None   Medications ordered for procedure: Meds ordered this encounter  Medications  . lactated ringers infusion 1,000 mL  . midazolam (VERSED) 5 MG/5ML injection 1-2 mg    Make sure Flumazenil is available in the pyxis when using this medication. If oversedation occurs, administer  0.2 mg IV over 15 sec. If after 45 sec no response, administer 0.2 mg again over 1 min; may repeat at 1 min intervals; not to exceed 4 doses (1 mg)  . fentaNYL (SUBLIMAZE) injection 25-50 mcg    Make sure Narcan is available in the pyxis when using this medication. In the event of respiratory depression (RR< 8/min): Titrate NARCAN (naloxone) in increments of 0.1 to 0.2 mg IV at 2-3 minute intervals, until desired degree of reversal.  . methylPREDNISolone acetate (DEPO-MEDROL) injection 80 mg  . lidocaine (PF) (XYLOCAINE) 1 % injection 10 mL  . ropivacaine (PF) 2 mg/mL (0.2%) (NAROPIN) injection 9 mL  . ceFAZolin (ANCEF) IVPB 1 g/50 mL premix    Order Specific Question:   Antibiotic Indication:    Answer:   Surgical Prophylaxis    Order Specific Question:   Other Indication:    Answer:   Procedure Prophylaxis  . promethazine (PHENERGAN) injection 25 mg  . diphenhydrAMINE (BENADRYL) injection 25 mg    May be given IM.  Marland Kitchen oxyCODONE (OXY IR/ROXICODONE) 5 MG immediate release tablet  Sig: Take 1-2 tablets (5-10 mg total) by mouth every 6 (six) hours as needed for moderate pain or severe pain (For post-procedure (radiofrequency) pain only. No refills.).    Dispense:  80 tablet    Refill:  0    Fill one day early if pharmacy is closed on scheduled refill date. To last until: 07/31/16   Medications administered: We administered lactated ringers, midazolam, fentaNYL, methylPREDNISolone acetate, lidocaine (PF), ropivacaine (PF) 2 mg/mL (0.2%), ceFAZolin, promethazine, and diphenhydrAMINE.  See the medical record for exact dosing, route, and time of administration.  Lab-work, Procedure(s), & Referral(s) Ordered: Orders Placed This Encounter  Procedures  . DG C-Arm 1-60 Min-No Report  . Informed Consent Details: Transcribe to consent form and obtain patient signature  . Provider attestation of informed consent for procedure/surgical case  . Verify informed consent  . Discharge instructions  .  Follow-up  . Latex precautions   Imaging Ordered: Results for orders placed in visit on 04/12/16  DG C-Arm 1-60 Min-No Report   Narrative Fluoroscopy was utilized by the requesting physician.  No radiographic  interpretation.    New Prescriptions   No medications on file   Primary Care Physician: Thressa Sheller, MD Location: [facility] Note by: Kathlen Brunswick. Dossie Arbour, M.D, DABA, DABAPM, DABPM, DABIPP, FIPP Date: 07/21/2016; Time: 5:59 PM  Disclaimer:  Medicine is not an Chief Strategy Officer. The only guarantee in medicine is that nothing is guaranteed. It is important to note that the decision to proceed with this intervention was based on the information collected from the patient. The Data and conclusions were drawn from the patient's questionnaire, the interview, and the physical examination. Because the information was provided in large part by the patient, it cannot be guaranteed that it has not been purposely or unconsciously manipulated. Every effort has been made to obtain as much relevant data as possible for this evaluation. It is important to note that the conclusions that lead to this procedure are derived in large part from the available data. Always take into account that the treatment will also be dependent on availability of resources and existing treatment guidelines, considered by other Pain Management Practitioners as being common knowledge and practice, at the time of the intervention. For Medico-Legal purposes, it is also important to point out that variation in procedural techniques and pharmacological choices are the acceptable norm. The indications, contraindications, technique, and results of the above procedure should only be interpreted and judged by a Board-Certified Interventional Pain Specialist with extensive familiarity and expertise in the same exact procedure and technique.  Instructions provided at this appointment: Patient Instructions   ____________________________________________________________________________________________  Post-Procedure instructions Instructions:  Apply ice: Fill a plastic sandwich bag with crushed ice. Cover it with a small towel and apply to injection site. Apply for 15 minutes then remove x 15 minutes. Repeat sequence on day of procedure, until you go to bed. The purpose is to minimize swelling and discomfort after procedure.  Apply heat: Apply heat to procedure site starting the day following the procedure. The purpose is to treat any soreness and discomfort from the procedure.  Food intake: Start with clear liquids (like water) and advance to regular food, as tolerated.   Physical activities: Keep activities to a minimum for the first 8 hours after the procedure.   Driving: If you have received any sedation, you are not allowed to drive for 24 hours after your procedure.  Blood thinner: Restart your blood thinner 6 hours after your procedure. (  Only for those taking blood thinners)  Insulin: As soon as you can eat, you may resume your normal dosing schedule. (Only for those taking insulin)  Infection prevention: Keep procedure site clean and dry.  Post-procedure Pain Diary: Extremely important that this be done correctly and accurately. Recorded information will be used to determine the next step in treatment.  Pain evaluated is that of treated area only. Do not include pain from an untreated area.  Complete every hour, on the hour, for the initial 8 hours. Set an alarm to help you do this part accurately.  Do not go to sleep and have it completed later. It will not be accurate.  Follow-up appointment: Keep your follow-up appointment after the procedure. Usually 2 weeks for most procedures. (6 weeks in the case of radiofrequency.) Bring you pain diary.  Expect:  From numbing medicine (AKA: Local Anesthetics): Numbness or decrease in pain.  Onset: Full effect within 15 minutes of  injected.  Duration: It will depend on the type of local anesthetic used. On the average, 1 to 8 hours.   From steroids: Decrease in swelling or inflammation. Once inflammation is improved, relief of the pain will follow.  Onset of benefits: Depends on the amount of swelling present. The more swelling, the longer it will take for the benefits to be seen.   Duration: Steroids will stay in the system x 2 weeks. Duration of benefits will depend on multiple posibilities including persistent irritating factors.  From procedure: Some discomfort is to be expected once the numbing medicine wears off. This should be minimal if ice and heat are applied as instructed. Call if:  You experience numbness and weakness that gets worse with time, as opposed to wearing off.  New onset bowel or bladder incontinence. (Spinal procedures only)  Emergency Numbers:  Durning business hours (Monday - Thursday, 8:00 AM - 4:00 PM) (Friday, 9:00 AM - 12:00 Noon): [phone]  After hours: [phone] ____________________________________________________________________________________________  Radiofrequency Lesioning Radiofrequency lesioning is a procedure that is performed to relieve pain. The procedure is often used for back, neck, or arm pain. Radiofrequency lesioning involves the use of a machine that creates radio waves to make heat. During the procedure, the heat is applied to the nerve that carries the pain signal. The heat damages the nerve and interferes with the pain signal. Pain relief usually starts about 2 weeks after the procedure and lasts for 6 months to 1 year. Tell a health care provider about:  Any allergies you have.  All medicines you are taking, including vitamins, herbs, eye drops, creams, and over-the-counter medicines.  Any problems you or family members have had with anesthetic medicines.  Any blood disorders you have.  Any surgeries you have had.  Any medical conditions you  have.  Whether you are pregnant or may be pregnant. What are the risks? Generally, this is a safe procedure. However, problems may occur, including:  Pain or soreness at the injection site.  Infection at the injection site.  Damage to nerves or blood vessels. What happens before the procedure?  Ask your health care provider about:  Changing or stopping your regular medicines. This is especially important if you are taking diabetes medicines or blood thinners.  Taking medicines such as aspirin and ibuprofen. These medicines can thin your blood. Do not take these medicines before your procedure if your health care provider instructs you not to.  Follow instructions from your health care provider about eating or drinking restrictions.  Plan to have someone take you home after the procedure.  If you go home right after the procedure, plan to have someone with you for 24 hours. What happens during the procedure?  You will be given one or more of the following:  A medicine to help you relax (sedative).  A medicine to numb the area (local anesthetic).  You will be awake during the procedure. You will need to be able to talk with the health care provider during the procedure.  With the help of a type of X-ray (fluoroscopy), the health care provider will insert a radiofrequency needle into the area to be treated.  Next, a wire that carries the radio waves (electrode) will be put through the radiofrequency needle. An electrical pulse will be sent through the electrode to verify the correct nerve. You will feel a tingling sensation, and you may have muscle twitching.  Then, the tissue that is around the needle tip will be heated by an electric current that is passed using the radiofrequency machine. This will numb the nerves.  A bandage (dressing) will be put on the insertion area after the procedure is done. The procedure may vary among health care providers and hospitals. What  happens after the procedure?  Your blood pressure, heart rate, breathing rate, and blood oxygen level will be monitored often until the medicines you were given have worn off.  Return to your normal activities as directed by your health care provider. This information is not intended to replace advice given to you by your health care provider. Make sure you discuss any questions you have with your health care provider. Document Released: 10/07/2010 Document Revised: 07/17/2015 Document Reviewed: 03/18/2014 Elsevier Interactive Patient Education  2017 [place]. Pain Management Discharge Instructions  General Discharge Instructions :  If you need to reach your doctor call: Monday-Friday 8:00 am - 4:00 pm at [phone] or toll free [phone].  After clinic hours [phone] to have operator reach doctor.  Bring all of your medication bottles to all your appointments in the pain clinic.  To cancel or reschedule your appointment with Pain Management please remember to call 24 hours in advance to avoid a fee.  Refer to the educational materials which you have been given on: General Risks, I had my Procedure. Discharge Instructions, Post Sedation.  Post Procedure Instructions:  The drugs you were given will stay in your system until tomorrow, so for the next 24 hours you should not drive, make any legal decisions or drink any alcoholic beverages.  You may eat anything you prefer, but it is better to start with liquids then soups and crackers, and gradually work up to solid foods.  Please notify your doctor immediately if you have any unusual bleeding, trouble breathing or pain that is not related to your normal pain.  Depending on the type of procedure that was done, some parts of your body may feel week and/or numb.  This usually clears up by tonight or the next day.  Walk with the use of an assistive device or accompanied by an adult for the 24 hours.  You may use ice on the  affected area for the first 24 hours.  Put ice in a Ziploc bag and cover with a towel and place against area 15 minutes on 15 minutes off.  You may switch to heat after 24 hours.

## 2016-07-21 NOTE — Patient Instructions (Addendum)
____________________________________________________________________________________________  Post-Procedure instructions Instructions:  Apply ice: Fill a plastic sandwich bag with crushed ice. Cover it with a small towel and apply to injection site. Apply for 15 minutes then remove x 15 minutes. Repeat sequence on day of procedure, until you go to bed. The purpose is to minimize swelling and discomfort after procedure.  Apply heat: Apply heat to procedure site starting the day following the procedure. The purpose is to treat any soreness and discomfort from the procedure.  Food intake: Start with clear liquids (like water) and advance to regular food, as tolerated.   Physical activities: Keep activities to a minimum for the first 8 hours after the procedure.   Driving: If you have received any sedation, you are not allowed to drive for 24 hours after your procedure.  Blood thinner: Restart your blood thinner 6 hours after your procedure. (Only for those taking blood thinners)  Insulin: As soon as you can eat, you may resume your normal dosing schedule. (Only for those taking insulin)  Infection prevention: Keep procedure site clean and dry.  Post-procedure Pain Diary: Extremely important that this be done correctly and accurately. Recorded information will be used to determine the next step in treatment.  Pain evaluated is that of treated area only. Do not include pain from an untreated area.  Complete every hour, on the hour, for the initial 8 hours. Set an alarm to help you do this part accurately.  Do not go to sleep and have it completed later. It will not be accurate.  Follow-up appointment: Keep your follow-up appointment after the procedure. Usually 2 weeks for most procedures. (6 weeks in the case of radiofrequency.) Bring you pain diary.  Expect:  From numbing medicine (AKA: Local Anesthetics): Numbness or decrease in pain.  Onset: Full effect within 15 minutes of  injected.  Duration: It will depend on the type of local anesthetic used. On the average, 1 to 8 hours.   From steroids: Decrease in swelling or inflammation. Once inflammation is improved, relief of the pain will follow.  Onset of benefits: Depends on the amount of swelling present. The more swelling, the longer it will take for the benefits to be seen.   Duration: Steroids will stay in the system x 2 weeks. Duration of benefits will depend on multiple posibilities including persistent irritating factors.  From procedure: Some discomfort is to be expected once the numbing medicine wears off. This should be minimal if ice and heat are applied as instructed. Call if:  You experience numbness and weakness that gets worse with time, as opposed to wearing off.  New onset bowel or bladder incontinence. (Spinal procedures only)  Emergency Numbers:  Durning business hours (Monday - Thursday, 8:00 AM - 4:00 PM) (Friday, 9:00 AM - 12:00 Noon): [phone]  After hours: [phone] ____________________________________________________________________________________________  Radiofrequency Lesioning Radiofrequency lesioning is a procedure that is performed to relieve pain. The procedure is often used for back, neck, or arm pain. Radiofrequency lesioning involves the use of a machine that creates radio waves to make heat. During the procedure, the heat is applied to the nerve that carries the pain signal. The heat damages the nerve and interferes with the pain signal. Pain relief usually starts about 2 weeks after the procedure and lasts for 6 months to 1 year. Tell a health care provider about:  Any allergies you have.  All medicines you are taking, including vitamins, herbs, eye drops, creams, and over-the-counter medicines.  Any problems you  or family members have had with anesthetic medicines.  Any blood disorders you have.  Any surgeries you have had.  Any medical conditions you  have.  Whether you are pregnant or may be pregnant. What are the risks? Generally, this is a safe procedure. However, problems may occur, including:  Pain or soreness at the injection site.  Infection at the injection site.  Damage to nerves or blood vessels. What happens before the procedure?  Ask your health care provider about:  Changing or stopping your regular medicines. This is especially important if you are taking diabetes medicines or blood thinners.  Taking medicines such as aspirin and ibuprofen. These medicines can thin your blood. Do not take these medicines before your procedure if your health care provider instructs you not to.  Follow instructions from your health care provider about eating or drinking restrictions.  Plan to have someone take you home after the procedure.  If you go home right after the procedure, plan to have someone with you for 24 hours. What happens during the procedure?  You will be given one or more of the following:  A medicine to help you relax (sedative).  A medicine to numb the area (local anesthetic).  You will be awake during the procedure. You will need to be able to talk with the health care provider during the procedure.  With the help of a type of X-ray (fluoroscopy), the health care provider will insert a radiofrequency needle into the area to be treated.  Next, a wire that carries the radio waves (electrode) will be put through the radiofrequency needle. An electrical pulse will be sent through the electrode to verify the correct nerve. You will feel a tingling sensation, and you may have muscle twitching.  Then, the tissue that is around the needle tip will be heated by an electric current that is passed using the radiofrequency machine. This will numb the nerves.  A bandage (dressing) will be put on the insertion area after the procedure is done. The procedure may vary among health care providers and hospitals. What  happens after the procedure?  Your blood pressure, heart rate, breathing rate, and blood oxygen level will be monitored often until the medicines you were given have worn off.  Return to your normal activities as directed by your health care provider. This information is not intended to replace advice given to you by your health care provider. Make sure you discuss any questions you have with your health care provider. Document Released: 10/07/2010 Document Revised: 07/17/2015 Document Reviewed: 03/18/2014 Elsevier Interactive Patient Education  2017 [place]. Pain Management Discharge Instructions  General Discharge Instructions :  If you need to reach your doctor call: Monday-Friday 8:00 am - 4:00 pm at [phone] or toll free [phone].  After clinic hours [phone] to have operator reach doctor.  Bring all of your medication bottles to all your appointments in the pain clinic.  To cancel or reschedule your appointment with Pain Management please remember to call 24 hours in advance to avoid a fee.  Refer to the educational materials which you have been given on: General Risks, I had my Procedure. Discharge Instructions, Post Sedation.  Post Procedure Instructions:  The drugs you were given will stay in your system until tomorrow, so for the next 24 hours you should not drive, make any legal decisions or drink any alcoholic beverages.  You may eat anything you prefer, but it is better to start with liquids then soups and  crackers, and gradually work up to solid foods.  Please notify your doctor immediately if you have any unusual bleeding, trouble breathing or pain that is not related to your normal pain.  Depending on the type of procedure that was done, some parts of your body may feel week and/or numb.  This usually clears up by tonight or the next day.  Walk with the use of an assistive device or accompanied by an adult for the 24 hours.  You may use ice on the  affected area for the first 24 hours.  Put ice in a Ziploc bag and cover with a towel and place against area 15 minutes on 15 minutes off.  You may switch to heat after 24 hours.

## 2016-07-21 NOTE — Progress Notes (Signed)
Safety precautions to be maintained throughout the outpatient stay will include: orient to surroundings, keep bed in low position, maintain call bell within reach at all times, provide assistance with transfer out of bed and ambulation.  

## 2016-07-22 ENCOUNTER — Ambulatory Visit (INDEPENDENT_AMBULATORY_CARE_PROVIDER_SITE_OTHER): Payer: BLUE CROSS/BLUE SHIELD | Admitting: Neurology

## 2016-07-22 ENCOUNTER — Encounter: Payer: Self-pay | Admitting: Neurology

## 2016-07-22 ENCOUNTER — Telehealth: Payer: Self-pay

## 2016-07-22 DIAGNOSIS — G43711 Chronic migraine without aura, intractable, with status migrainosus: Secondary | ICD-10-CM | POA: Diagnosis not present

## 2016-07-22 NOTE — Patient Instructions (Signed)
Remember to drink plenty of fluid, eat healthy meals and do not skip any meals. Try to eat protein with a every meal and eat a healthy snack such as fruit or nuts in between meals. Try to keep a regular sleep-wake schedule and try to exercise daily, particularly in the form of walking, 20-30 minutes a day, if you can.   As far as your medications are concerned, I would like to suggest: Botox and Erenumab  I would like to see you back for botox, sooner if we need to. Please call us with any interim questions, concerns, problems, updates or refill requests.   Our phone number is [phone]. We also have an after hours call service for urgent matters and there is a physician on-call for urgent questions. For any emergencies you know to call 911 or go to the nearest emergency room

## 2016-07-22 NOTE — Progress Notes (Signed)
[facility]    Provider:  Dr Jaynee Eagles Referring Provider: Thressa Sheller, MD Primary Care Physician:  Thressa Sheller, MD  CC:  Migraines  HPI:  Kari Green is a 51 y.o. female here as a referral from Dr. Noah Delaine for chronic migraines. She has a PMHx of Fibromyalgia and chronic migraines. Migraines started in 2001. The migraines are in the occipital and top of the head and behind the eyes, pounding and throbbing, she has light and sound sensitivity, she also vomits. Smells can trigger. No aura. A dark room an sleep helps. She wears sunglasses to watch TV.  She have daily headaches continuous. They are always pounding and migrainous. The botox made the migraines at least 50% less painful and 50% less frequent migraines and definitely more manageable. No medication overuse. This daily frequency has been ongoing over 3 months since last botox injections. Have significantly worsened since botox. Her scalp is sensitive. No other focal neurologic deficits, associated symptoms, inciting events or modifiable factors.  Tried: Topiramate, Neurontin, propranolol, amitriptyline, nortriptyline, phenergan,   Reviewed notes, labs and imaging from outside physicians, which showed:  Labs: cbc and bmp 03/2016 unremarkable  CT 03/2016 showed No acute intracranial abnormalities including mass lesion or mass effect, hydrocephalus, extra-axial fluid collection, midline shift, hemorrhage, or acute infarction, large ischemic events (personally reviewed images)   Review of Systems: Patient complains of symptoms per HPI as well as the following symptoms; no CP, no SOB. Pertinent negatives and positives per HPI. All others negative.   Social History   Social History  . Marital status: Married    Spouse name: N/A  . Number of children: N/A  . Years of education: N/A   Occupational History  . Not on file.   Social History Main Topics  . Smoking status: Former Smoker    Quit date:  02/22/2006  . Smokeless tobacco: Never Used  . Alcohol use 0.0 oz/week     Comment: rarely  . Drug use: No  . Sexual activity: Not on file   Other Topics Concern  . Not on file   Social History Narrative   Lives with husband.      Family History  Problem Relation Age of Onset  . Anesthesia problems Mother   . Cancer Mother        ovarian  . Leukemia Mother   . Cancer Daughter        cervical  . Cancer Maternal Aunt        breast    Past Medical History:  Diagnosis Date  . Acute Pain following procedure (Lumbar Sympathetic Radiofrequency Ablation) 05/06/2015  . Anxiety   . Arthritis    right thumb  . Asthma   . Cancer [facility])    history of skin cancer  . Chronic, continuous use of opioids   . Cognitive dysfunction    patient denies  . Complication of anesthesia    "not asleep and aware of everything"  . Degenerative disc disease, cervical   . Degenerative disc disease, lumbar   . Delayed gastric emptying   . Dental crowns present   . Difficult intravenous access   . Difficulty swallowing pills   . Esophageal dysmotility   . Family history of adverse reaction to anesthesia    family - itching, hives, PONV  . Fibromyalgia   . Hemangioma of liver   . Hemangioma, renal   . History of bronchitis   . History of hiatal hernia   . History of kidney  stones 04/2014  . History of pneumonia   . Hypothyroidism (acquired)   . IC (interstitial cystitis)   . Irritable bowel syndrome (IBS)   . Kidney stone   . Knee pain, right    states gets "nerves burned" periodically  . Limited joint range of motion    right knee - unable to fully extend right leg  . Memory impairment    due to pain   . Migraines   . Osteoporosis   . Pain management   . Pain syndrome, chronic   . Pneumonia 2016  . PONV (postoperative nausea and vomiting)    uncontrollable itching with anesthesia; hx. of anesthesia awareness; pt needs 50  mg of Benadryl  . PONV (postoperative nausea and vomiting)      "need medication before and need medication afterwards"  . Sciatica    bilateral  . Seasonal allergies     Past Surgical History:  Procedure Laterality Date  . ABDOMINAL HYSTERECTOMY    . ANTERIOR CERVICAL DECOMP/DISCECTOMY FUSION  03/10/2009   C5-6, C6-7  . ANTERIOR CERVICAL DECOMP/DISCECTOMY FUSION N/A 07/16/2015   Procedure: ANTERIOR CERVICAL DECOMPRESSION FUSION 4-5 WITH INSTRUMENTATION AND AUTOGRAFT;  Surgeon: Phylliss Bob, MD;  Location: [place];  Service: Orthopedics;  Laterality: N/A;  ANTERIOR CERVICAL DECOMPRESSION FUSION 4-5 WITH INSTRUMENTATION AND ALLOGRAFT  . APPENDECTOMY  ~ 1992  . AUGMENTATION MAMMAPLASTY Bilateral 1996; 2001, 2016  . BREAST SURGERY  1980's   accessory breast tissue exc.  Marland Kitchen CARPAL TUNNEL RELEASE Right 07/26/2001  . CARPAL TUNNEL RELEASE Left   . CARPOMETACARPEL SUSPENSION PLASTY Right 02/10/2015   Procedure: RIGHT THUMB SUSPENSION  ARTHROPLASTY;  Surgeon: Milly Jakob, MD;  Location: [place];  Service: Orthopedics;  Laterality: Right;  ANESTHESIA: PRE- OP BLOCK  . COLONOSCOPY W/ POLYPECTOMY    . CYSTO WITH HYDRODISTENSION  01/29/2011   Procedure: CYSTOSCOPY/HYDRODISTENSION;  Surgeon: Claybon Jabs, MD;  Location: [facility];  Service: Urology;  Laterality: N/A;  . CYSTO WITH HYDRODISTENSION N/A 05/08/2014   Procedure: CYSTOSCOPY/HYDRODISTENSION WITH BILATERAL RETROGRADES;  Surgeon: Ardis Hughs, MD;  Location[facility];  Service: Urology;  Laterality: N/A;  . CYSTO WITH HYDRODISTENSION N/A 03/27/2015   Procedure: CYSTOSCOPY/HYDRODISTENSION OF BLADDER, INSTILLATION OF MARCAINE/PYRIDIUM;  Surgeon: Ardis Hughs, MD;  Location: [facility];  Service: Urology;  Laterality: N/A;  . CYSTO/  BLADDER BX  03/08/2008  . CYSTO/ URETHRAL DILATION/ HYDRODISTENTION/ BLADDER BX  04/10/2003  . CYSTOSCOPY W/ RETROGRADES  01/29/2011   Procedure: CYSTOSCOPY WITH RETROGRADE PYELOGRAM;  Surgeon: Claybon Jabs, MD;  Location: [facility];  Service: Urology;  Laterality: Bilateral;  . CYSTOSCOPY WITH STENT PLACEMENT  2006   URETERAL  . DEBRIDEMENT RIGHT THUMB MP JOINT Right 04/10/2002  . DECOMPRESSION LEFT ULNAR NERVE, ELBOW Left 01/29/2003  . ESOPHAGOGASTRODUODENOSCOPY (EGD) WITH ESOPHAGEAL DILATION  10-26-2000  . EUS N/A 04/23/2016   Procedure: UPPER ENDOSCOPIC ULTRASOUND (EUS) LINEAR;  Surgeon: Carol Ada, MD;  Location: [facility];  Service: Endoscopy;  Laterality: N/A;  . EXCISION HAGLUND'S DEFORMITY WITH ACHILLES TENDON REPAIR Bilateral 2013-2014  . EYE SURGERY Bilateral    x4  . FISSURECTOMY  ~ 1988 x2  . GANGLION CYST EXCISION Right 02/10/2015   Procedure: REMOVAL GANGLION OF WRIST, PARTIAL ULNAR EXCISION;  Surgeon: Milly Jakob, MD;  Location: [place];  Service: Orthopedics;  Laterality: Right;  . HAND SURGERY Right 01-31-15  . HEMORROIDECTOMY    . KIDNEY STONE SURGERY    .  KNEE ARTHROSCOPY Right 08/16/2000  . KNEE ARTHROSCOPY W/ LATERAL RELEASE Right 11/29/2000   states 13 surgeries on right knee  . KNEE ARTHROSCOPY WITH FULKERSON SLIDE Right 11/29/2000  . LAPAROSCOPIC ASSISTED VAGINAL HYSTERECTOMY  Feb 1999  . LAPAROSCOPIC CHOLECYSTECTOMY  03/04/2002  . LAPAROSCOPIC LYSIS OF ADHESIONS  02/11/2012   Procedure: LAPAROSCOPIC LYSIS OF ADHESIONS;  Surgeon: Cheri Fowler, MD;  Location: [facility];  Service: Gynecology;;  . LAPAROSCOPY  02/11/2012   Procedure: LAPAROSCOPY DIAGNOSTIC;  Surgeon: Cheri Fowler, MD;  Location: [facility];  Service: Gynecology;  Laterality: N/A;  Diagnostic  Operative Laparoscopic   . LAPAROSCOPY ADHESIOLYSIS AND REMOVAL POSSIBLE RIGHT OVARY REMNANT  04/18/2000  . LAPAROSCOPY LYSIS ADHESIONS/  BILATERAL SALPINGOOPHORECTOMY/  [facility] OF ENDOMETRIOSIS  1998  . MELANOMA EXCISION Left 2013   "behind knee"   . MELANOMA EXCISION     scalp  . NASAL SINUS SURGERY  2013  . NEGATIVE SLEEP STUDY  2006 approx .  per pt  . REFRACTIVE  SURGERY Bilateral ~ 1999; ~ 2013  . RIGHT THUMB FUSION OF MPJ Right 10/03/2002  . RIGHT TOTAL KNEE REVISION ARTHROPLASTY Right 03/14/2007  . SHOULDER HEMI-ARTHROPLASTY Left 12/04/2015   Procedure: SHOULDER HEMI-ARTHROPLASTY;  Surgeon: Tania Ade, MD;  Location: [place];  Service: Orthopedics;  Laterality: Left;  Left shoulder hemiarthroplasty  . SHOULDER SURGERY Left   . TONSILLECTOMY AND ADENOIDECTOMY  ~ 1989  . TOTAL KNEE ARTHROPLASTY Right 09/10/2003; 12/08/2012   x3  . TOTAL THYROIDECTOMY  1993  . TRANSANAL HEMORRHOIDAL DEARTERIALIZATION  03/26/2011  . TRANSTHORACIC ECHOCARDIOGRAM  09/22/2012  . TRIGGER FINGER RELEASE Left 07/06/2016   Procedure: LEFT THUMB TRIGGER RELEASE;  Surgeon: Milly Jakob, MD;  Location: [place];  Service: Orthopedics;  Laterality: Left;  Marland Kitchen VIDEO BRONCHOSCOPY Bilateral 10/20/2015   Procedure: VIDEO BRONCHOSCOPY WITHOUT FLUORO;  Surgeon: Collene Gobble, MD;  Location: [place];  Service: Cardiopulmonary;  Laterality: Bilateral;    Current Outpatient Prescriptions  Medication Sig Dispense Refill  . albuterol (PROAIR HFA) 108 (90 Base) MCG/ACT inhaler Inhale 2 puffs into the lungs every 4 (four) hours as needed for wheezing or shortness of breath. 1 Inhaler 3  . albuterol (PROVENTIL) (2.5 MG/3ML) 0.083% nebulizer solution Take 3 mLs (2.5 mg total) by nebulization every 4 (four) hours as needed for wheezing (or coughing spells). 75 vial 1  . alclomethasone (ACLOVATE) 0.05 % cream alclometasone 0.05 % topical cream  APPLY 1 APPLICATION APPLY ON THE SKIN DAILY APPLY SMALL AMOUNT TO AFFECTED AREAS DAILY    . ALPRAZolam (XANAX) 0.5 MG tablet Take 1 mg by mouth 3 (three) times daily as needed. Anxiety    . Azelastine HCl 0.15 % SOLN Place 1-2 sprays into both nostrils 2 (two) times daily. 30 mL 5  . cyanocobalamin (,VITAMIN B-12,) 1000 MCG/ML injection Inject 1,000 mcg into the muscle once a week. Reported on 03/25/2015    . diphenhydrAMINE (BENADRYL)  50 MG tablet Take 50 mg by mouth every 4 (four) hours as needed for itching.     Marland Kitchen EPINEPHrine (EPIPEN 2-PAK) 0.3 mg/0.3 mL IJ SOAJ injection Inject 0.3 mLs (0.3 mg total) into the muscle as needed (in the event of a severe life-threatening reaction). 2 Device 2  . estradiol (ESTRACE) 0.5 MG tablet Take 1 mg by mouth at bedtime.     Marland Kitchen EVZIO 0.4 MG/0.4ML SOAJ AUTO INJECT AS NEEDED IN CASE OF EMERGENCY WHERE GRANDCHILDREN MAY GET A HOLD OF PATIENTS PAIN MEDICATIONS.  0  . fluticasone (FLONASE)  50 MCG/ACT nasal spray INHALE 2 SPRAYS INTO BOTH NOSTRILS DAILY. 16 g 3  . gabapentin (NEURONTIN) 300 MG capsule Take 300 mg by mouth 3 (three) times daily.    Marland Kitchen HYDROmorphone (DILAUDID) 2 MG tablet Take 1-3 tablets (2-6 mg total) by mouth every 4 (four) hours as needed for moderate pain or severe pain. 60 tablet 0  . ketoconazole (NIZORAL) 2 % shampoo One application once a month as needed for flaking  3  . levothyroxine (SYNTHROID, LEVOTHROID) 50 MCG tablet Take 200 mcg by mouth daily. Pt can only take name brand    . meloxicam (MOBIC) 15 MG tablet Take 15 mg by mouth daily.    . MethylPREDNISolone Acetate (DEPO-MEDROL IJ) Inject 1 each as directed daily as needed (allergic reaction.).    Marland Kitchen mometasone-formoterol (DULERA) 200-5 MCG/ACT AERO Inhale 2 puffs into the lungs 2 (two) times daily. 13 g 5  . Multiple Vitamin (MULTIVITAMIN WITH MINERALS) TABS tablet Take 1 tablet by mouth at bedtime.    . OnabotulinumtoxinA (BOTOX IJ) Inject as directed every 3 (three) months. For migraines    . promethazine (PHENERGAN) 25 MG tablet Take 1-2 tablets (25-50 mg total) by mouth 2 (two) times daily as needed for nausea or vomiting. (Patient taking differently: Take 50 mg by mouth every 8 (eight) hours as needed for nausea or vomiting. ) 30 tablet 3  . propranolol (INDERAL) 20 MG tablet Take 40 mg by mouth daily.     Marland Kitchen topiramate (TOPAMAX) 25 MG tablet Take 200 mg by mouth daily.   1  . vitamin C (ASCORBIC ACID) 500 MG  tablet Take 1,000 mg by mouth daily.     No current facility-administered medications for this visit.     Allergies as of 07/22/2016 - Review Complete 07/22/2016  Allergen Reaction Noted  . Cobalt Hives, Swelling, and Other (See Comments) 03/25/2014  . Latex Hives and Swelling 01/25/2011  . Nickel Hives, Swelling, and Other (See Comments) 03/25/2014  . Peanuts [peanut oil] Hives, Swelling, and Other (See Comments) 09/21/2012  . Red dye Hives, Itching, and Swelling 01/25/2011  . Azithromycin Swelling 03/25/2015  . Chloraprep one step [chlorhexidine gluconate] Other (See Comments) 05/07/2014  . Corticosteroids Swelling 02/04/2015  . Flexeril [cyclobenzaprine] Swelling 03/11/2015  . Morphine and related Hives and Itching 01/25/2011  . Other Hives and Other (See Comments) 03/08/2012  . Percocet [oxycodone-acetaminophen] Swelling 06/06/2015  . Supartz [sodium hyaluronate] Hives and Swelling 02/04/2015  . Surgical lubricant Swelling 12/02/2012  . Yellow dyes (non-tartrazine) Hives 01/25/2011  . Cephalexin    . Monistat [miconazole]  04/11/2014  . Prednisone Other (See Comments) 02/26/2011  . Sulfa antibiotics    . Yellow dye    . Soap Itching 02/04/2015  . Tape Other (See Comments) 03/25/2011    Vitals: BP 126/84   Pulse 77   Ht 5\' 3"  (1.6 m)   Wt 133 lb 3.2 oz (60.4 kg)   BMI 23.60 kg/m  Last Weight:  Wt Readings from Last 1 Encounters:  07/22/16 133 lb 3.2 oz (60.4 kg)   Last Height:   Ht Readings from Last 1 Encounters:  07/22/16 5\' 3"  (1.6 m)   Physical exam: Exam: Gen: Labile, c/o severe headache but in NAD, conversant                     CV: RRR, no MRG. No Carotid Bruits. No peripheral edema, warm, nontender Eyes: Conjunctivae clear without exudates or hemorrhage  Neuro: Detailed Neurologic Exam  Speech:    Speech is normal; fluent and spontaneous with normal comprehension.  Cognition:    The patient is oriented to person, place, and time;     recent and  remote memory intact;     language fluent;     normal attention, concentration,     fund of knowledge Cranial Nerves:    The pupils are equal, round, and reactive to light. The fundi are normal and spontaneous venous pulsations are present. Visual fields are full to finger confrontation. Extraocular movements are intact. Trigeminal sensation is intact and the muscles of mastication are normal. The face is symmetric. The palate elevates in the midline. Hearing intact. Voice is normal. Shoulder shrug is normal. The tongue has normal motion without fasciculations.   Coordination:    Normal finger to nose and heel to shin. Normal rapid alternating movements.   Gait:    Heel-toe and tandem gait are normal.   Motor Observation:    No asymmetry, no atrophy, and no involuntary movements noted. Tone:    Normal muscle tone.    Posture:    Posture is normal. normal erect    Strength: left arm and right leg 4/5 otherwise strength is V/V in the upper and lower limbs.      Sensation: intact to LT     Reflex Exam:  DTR's:    Deep tendon reflexes in the upper and lower extremities are normal bilaterally.   Toes:    The toes are downgoing bilaterally.   Clonus:    Clonus is absent.       Assessment/Plan:  51 year old with chronic migraines who did well on Botox. The botox made the migraines at least 50% less painful and 50% less frequent migraines and definitely more manageable. She was discharged from [facility] neurology for non compliance and no-showing multiple appointments. She has failed multiple medications as above. She reports daily continuous migraines when not on botox.  Will restart Botox Erenumab, discussed side effects  To prevent or relieve headaches, try the following: Cool Compress. Lie down and place a cool compress on your head.  Avoid headache triggers. If certain foods or odors seem to have triggered your migraines in the past, avoid them. A headache diary might help you  identify triggers.  Include physical activity in your daily routine. Try a daily walk or other moderate aerobic exercise.  Manage stress. Find healthy ways to cope with the stressors, such as delegating tasks on your to-do list.  Practice relaxation techniques. Try deep breathing, yoga, massage and visualization.  Eat regularly. Eating regularly scheduled meals and maintaining a healthy diet might help prevent headaches. Also, drink plenty of fluids.  Follow a regular sleep schedule. Sleep deprivation might contribute to headaches Consider biofeedback. With this mind-body technique, you learn to control certain bodily functions - such as muscle tension, heart rate and blood pressure - to prevent headaches or reduce headache pain.    Proceed to emergency room if you experience new or worsening symptoms or symptoms do not resolve, if you have new neurologic symptoms or if headache is severe, or for any concerning symptom.   Provided education and documentation from American headache Society toolbox including articles on: chronic migraine medication overuse headache, chronic migraines, prevention of migraines, behavioral and other nonpharmacologic treatments for headache.     Sarina Ill, MD  [facility] [address], [place] [address]  Phone [phone] Fax [phone]

## 2016-07-22 NOTE — Telephone Encounter (Signed)
Post procedure phone call. Patient states she is doing good.  

## 2016-07-23 ENCOUNTER — Telehealth: Payer: Self-pay

## 2016-07-23 DIAGNOSIS — Z1231 Encounter for screening mammogram for malignant neoplasm of breast: Secondary | ICD-10-CM | POA: Diagnosis not present

## 2016-07-23 NOTE — Telephone Encounter (Signed)
New med for prevention of migraines (erenumab) service request form and rx completed, signed and faxed to [employer].

## 2016-07-25 DIAGNOSIS — G43711 Chronic migraine without aura, intractable, with status migrainosus: Secondary | ICD-10-CM | POA: Insufficient documentation

## 2016-07-25 DIAGNOSIS — G43009 Migraine without aura, not intractable, without status migrainosus: Secondary | ICD-10-CM | POA: Insufficient documentation

## 2016-07-26 NOTE — Addendum Note (Signed)
Addendum  created 07/26/16 1153 by Myrtie Soman, MD   Sign clinical note

## 2016-07-26 NOTE — Addendum Note (Signed)
Addended by: Sarina Ill B on: 07/26/2016 09:10 AM   Modules accepted: Level of Service

## 2016-07-26 NOTE — Anesthesia Postprocedure Evaluation (Signed)
Anesthesia Post Note  Patient: Kari Green  Procedure(s) Performed: Procedure(s) (LRB): UPPER ENDOSCOPIC ULTRASOUND (EUS) LINEAR (N/A)     Anesthesia Post Evaluation  Last Vitals:  Vitals:   04/23/16 1310 04/23/16 1315  BP: (!) 116/48   Pulse: 76 83  Resp: 12 13  Temp:      Last Pain:  Vitals:   04/23/16 1236  TempSrc: Oral                 Eluzer Howdeshell S

## 2016-07-30 ENCOUNTER — Telehealth: Payer: Self-pay

## 2016-07-30 NOTE — Telephone Encounter (Signed)
Rn receive a call from Dr. Leonie Man the work in pm doctor.He stated pt was calling about a new headache medication. Rn call patient back about the new injectoin medication erenumab. Pt stated was it sent to a local pharmacy. Rn stated the application, and information was sent to [employer] a [employer], therapeutic.Rn stated the local pharmacies dont supply it . Rn stated there is processing time with PA and approval. Rn stated it was fax 07/23/2016 per [facility] note. Rn stated the company will do the authorization. Rn stated a message will be sent to [facility] . Rn stated she will get a call next week about who to contact. Pt verbalized understanding.

## 2016-08-02 DIAGNOSIS — M542 Cervicalgia: Secondary | ICD-10-CM | POA: Diagnosis not present

## 2016-08-02 DIAGNOSIS — M545 Low back pain: Secondary | ICD-10-CM | POA: Diagnosis not present

## 2016-08-02 DIAGNOSIS — G894 Chronic pain syndrome: Secondary | ICD-10-CM | POA: Diagnosis not present

## 2016-08-02 DIAGNOSIS — M79605 Pain in left leg: Secondary | ICD-10-CM | POA: Diagnosis not present

## 2016-08-05 DIAGNOSIS — G894 Chronic pain syndrome: Secondary | ICD-10-CM | POA: Diagnosis not present

## 2016-08-05 DIAGNOSIS — M542 Cervicalgia: Secondary | ICD-10-CM | POA: Diagnosis not present

## 2016-08-05 DIAGNOSIS — M545 Low back pain: Secondary | ICD-10-CM | POA: Diagnosis not present

## 2016-08-05 DIAGNOSIS — F99 Mental disorder, not otherwise specified: Secondary | ICD-10-CM | POA: Diagnosis not present

## 2016-08-05 DIAGNOSIS — M79605 Pain in left leg: Secondary | ICD-10-CM | POA: Diagnosis not present

## 2016-08-11 ENCOUNTER — Ambulatory Visit: Payer: BLUE CROSS/BLUE SHIELD | Admitting: Pain Medicine

## 2016-08-12 IMAGING — CR DG CHEST 2V
2 series · 2 of 2 positions shown · non-contrast
Comparison: 02/25/2015.

CLINICAL DATA: Preop cervical fusion 07/16/2015.

EXAM:
CHEST  2 VIEW

[w chest pa]
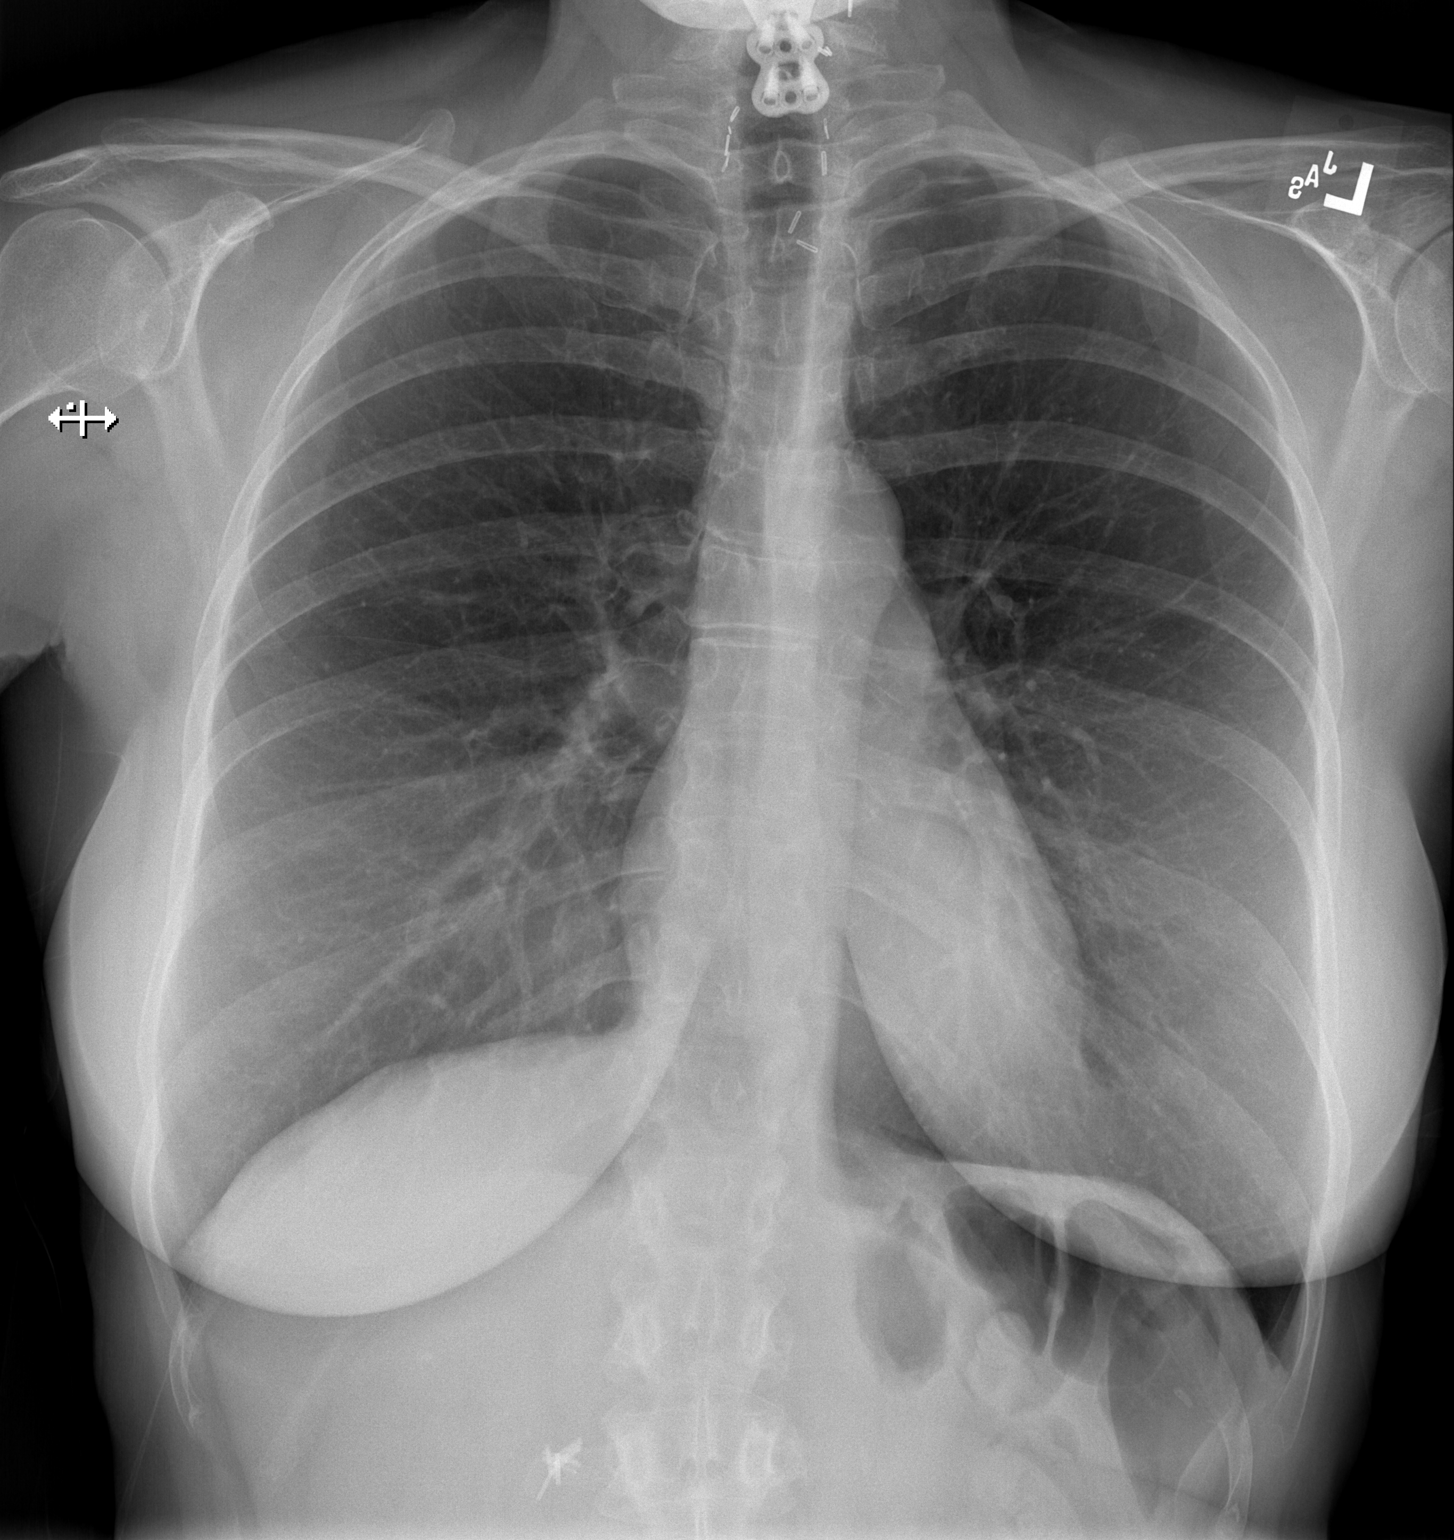

[w chest lat]
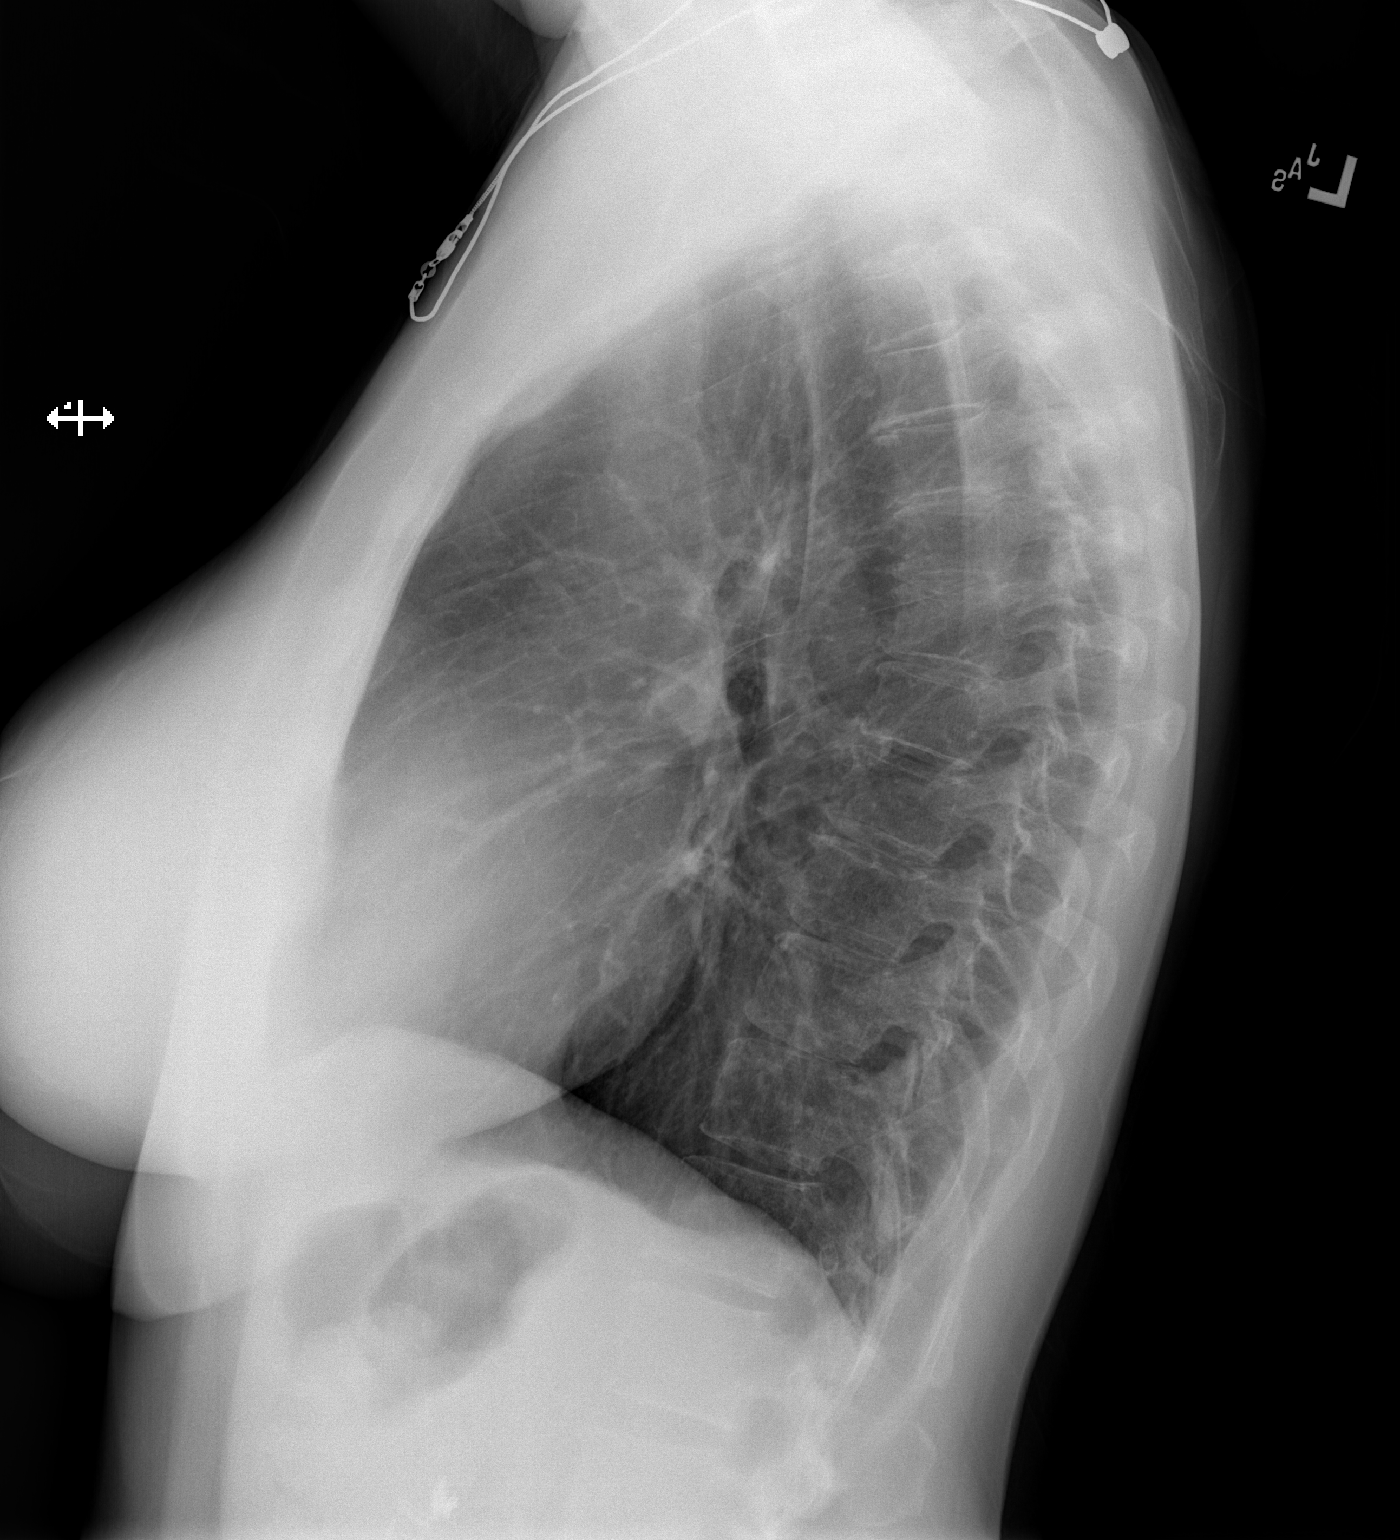

[2 of 2 positions shown; findings below may reference images not displayed]

FINDINGS: Trachea is midline. Surgical clips are seen in the expected location
of the thyroid. Heart size normal. Lungs may be mildly hyperinflated
but are clear.
IMPRESSION: No acute findings.

## 2016-08-16 ENCOUNTER — Encounter (HOSPITAL_BASED_OUTPATIENT_CLINIC_OR_DEPARTMENT_OTHER): Payer: Self-pay

## 2016-08-16 DIAGNOSIS — Z5321 Procedure and treatment not carried out due to patient leaving prior to being seen by health care provider: Secondary | ICD-10-CM | POA: Insufficient documentation

## 2016-08-16 DIAGNOSIS — L299 Pruritus, unspecified: Secondary | ICD-10-CM | POA: Insufficient documentation

## 2016-08-16 DIAGNOSIS — R51 Headache: Secondary | ICD-10-CM | POA: Diagnosis not present

## 2016-08-16 NOTE — ED Triage Notes (Addendum)
C/o itching all over after walking outside today-approx 3pm-took benadryl 50mg  330p,-NAD-steady gait

## 2016-08-16 NOTE — ED Notes (Signed)
Pt also now added she wants to seen for migraine that started last night-NAD

## 2016-08-17 ENCOUNTER — Emergency Department (HOSPITAL_BASED_OUTPATIENT_CLINIC_OR_DEPARTMENT_OTHER)
Admission: EM | Admit: 2016-08-17 | Discharge: 2016-08-17 | Disposition: A | Discharge status: Home / Self Care | Payer: BLUE CROSS/BLUE SHIELD | Attending: Emergency Medicine | Admitting: Emergency Medicine

## 2016-08-17 ENCOUNTER — Telehealth: Payer: Self-pay | Admitting: Neurology

## 2016-08-17 NOTE — Telephone Encounter (Signed)
Pt calling back stating that it has been almost a month and she has not been contacted by anyone re: medication.  Pt asking to be called

## 2016-08-17 NOTE — Telephone Encounter (Signed)
Pt asking for a call re: Botox, please call

## 2016-08-17 NOTE — Telephone Encounter (Signed)
Spoke to [place] with [employer] this afternoon. She checked records and said that pt's service request and benefits investigation for [employer] is in progress. Also that they tried to contact patient yesterday. Returned pt's call to update her but no answer. Mssg left per DPR. She may call back w/ questions or contact [employer] directly @ [phone] (Mon-Fri from 8 until 5).

## 2016-08-19 NOTE — Telephone Encounter (Signed)
I called the patient and spoke with her regarding the botox injections. She inquired about an injection she was waiting for her pharmacy to fill. She said the pharmacy was waiting on our office for the medication. She wanted to know when she could come in for her botox injections. I told her that clinicals had been submitted to her insurance for authorization but it was still pending. I asked her the last time she got the injections and she stated May, I told her that she would not be due August. She said that was not correct then, because she knows she is due for it. She looked back at an old box and said that the last time she had the injection was October. I asked her who the doctor was that gave her the injection and she said that she did not know but that Dr. Jaynee Eagles could tell me.

## 2016-08-19 NOTE — Telephone Encounter (Addendum)
Pt had last Botox by Dr. Tomi Likens in Dec. She no-showed her March appt and was dismissed from [facility] d/t multiple no-shows. Botox and [employer] authorizations are currently pending. Looks like she went to ER earlier this week but left w/o being seen.   Called and discussed w/ pt. Says that she spoke to someone at [facility] yesterday and they told her that no one had tried to call her. She also received a letter stating that additional information was needed from the provider. An updated [employer] form was re-faxed to [employer] on 08/02/16 and is in progress per Olegario Shearer (nurse for [place]). Will call pt w/ any updates as they become available. Verbalized understanding and appreciation for call.

## 2016-08-24 ENCOUNTER — Emergency Department (HOSPITAL_BASED_OUTPATIENT_CLINIC_OR_DEPARTMENT_OTHER)
Admission: EM | Admit: 2016-08-24 | Discharge: 2016-08-24 | Disposition: A | Discharge status: Home / Self Care | Payer: BLUE CROSS/BLUE SHIELD | Attending: Emergency Medicine | Admitting: Emergency Medicine

## 2016-08-24 ENCOUNTER — Encounter (HOSPITAL_BASED_OUTPATIENT_CLINIC_OR_DEPARTMENT_OTHER): Payer: Self-pay | Admitting: Emergency Medicine

## 2016-08-24 DIAGNOSIS — Z91040 Latex allergy status: Secondary | ICD-10-CM | POA: Diagnosis not present

## 2016-08-24 DIAGNOSIS — Z85828 Personal history of other malignant neoplasm of skin: Secondary | ICD-10-CM | POA: Diagnosis not present

## 2016-08-24 DIAGNOSIS — W57XXXA Bitten or stung by nonvenomous insect and other nonvenomous arthropods, initial encounter: Secondary | ICD-10-CM | POA: Insufficient documentation

## 2016-08-24 DIAGNOSIS — S40862A Insect bite (nonvenomous) of left upper arm, initial encounter: Secondary | ICD-10-CM | POA: Diagnosis not present

## 2016-08-24 DIAGNOSIS — Z79891 Long term (current) use of opiate analgesic: Secondary | ICD-10-CM | POA: Diagnosis not present

## 2016-08-24 DIAGNOSIS — G894 Chronic pain syndrome: Secondary | ICD-10-CM | POA: Diagnosis not present

## 2016-08-24 DIAGNOSIS — E039 Hypothyroidism, unspecified: Secondary | ICD-10-CM | POA: Diagnosis not present

## 2016-08-24 DIAGNOSIS — J45909 Unspecified asthma, uncomplicated: Secondary | ICD-10-CM | POA: Insufficient documentation

## 2016-08-24 DIAGNOSIS — M542 Cervicalgia: Secondary | ICD-10-CM | POA: Diagnosis not present

## 2016-08-24 DIAGNOSIS — M79602 Pain in left arm: Secondary | ICD-10-CM | POA: Diagnosis not present

## 2016-08-24 DIAGNOSIS — G43011 Migraine without aura, intractable, with status migrainosus: Secondary | ICD-10-CM

## 2016-08-24 DIAGNOSIS — M25561 Pain in right knee: Secondary | ICD-10-CM | POA: Diagnosis not present

## 2016-08-24 DIAGNOSIS — R110 Nausea: Secondary | ICD-10-CM | POA: Insufficient documentation

## 2016-08-24 DIAGNOSIS — R197 Diarrhea, unspecified: Secondary | ICD-10-CM | POA: Diagnosis not present

## 2016-08-24 DIAGNOSIS — M545 Low back pain: Secondary | ICD-10-CM | POA: Diagnosis not present

## 2016-08-24 DIAGNOSIS — Z79899 Other long term (current) drug therapy: Secondary | ICD-10-CM | POA: Diagnosis not present

## 2016-08-24 DIAGNOSIS — M25512 Pain in left shoulder: Secondary | ICD-10-CM | POA: Diagnosis not present

## 2016-08-24 DIAGNOSIS — M79604 Pain in right leg: Secondary | ICD-10-CM | POA: Diagnosis not present

## 2016-08-24 DIAGNOSIS — M79605 Pain in left leg: Secondary | ICD-10-CM | POA: Diagnosis not present

## 2016-08-24 MED ORDER — DIPHENHYDRAMINE HCL 50 MG/ML IJ SOLN: 50.0000 mg | Freq: Once | INTRAMUSCULAR | Status: DC

## 2016-08-24 MED ORDER — DIPHENHYDRAMINE HCL 50 MG/ML IJ SOLN
50.0000 mg | Freq: Once | INTRAMUSCULAR | Status: AC
  Administered 2016-08-24: 50 mg via INTRAMUSCULAR

## 2016-08-24 MED ORDER — PROMETHAZINE HCL 25 MG PO TABS: 25.0000 mg | ORAL_TABLET | Freq: Two times a day (BID) | ORAL | 0 refills | Status: DC | PRN

## 2016-08-24 MED ORDER — CLINDAMYCIN HCL 150 MG PO CAPS
450.0000 mg | ORAL_CAPSULE | Freq: Once | ORAL | Status: AC
  Administered 2016-08-24: 450 mg via ORAL
  Filled 2016-08-24: qty 3

## 2016-08-24 MED ORDER — IBUPROFEN 800 MG PO TABS: 800.0000 mg | ORAL_TABLET | Freq: Three times a day (TID) | ORAL | 0 refills | Status: DC

## 2016-08-24 MED ORDER — DIPHENHYDRAMINE HCL 25 MG PO CAPS
50.0000 mg | ORAL_CAPSULE | Freq: Once | ORAL | Status: DC
  Filled 2016-08-24: qty 2

## 2016-08-24 MED ORDER — IBUPROFEN 800 MG PO TABS
800.0000 mg | ORAL_TABLET | Freq: Once | ORAL | Status: AC
  Administered 2016-08-24: 800 mg via ORAL
  Filled 2016-08-24: qty 1

## 2016-08-24 MED ORDER — METHOCARBAMOL 500 MG PO TABS: 500.0000 mg | ORAL_TABLET | Freq: Two times a day (BID) | ORAL | 0 refills | Status: DC

## 2016-08-24 MED ORDER — PROMETHAZINE HCL 25 MG/ML IJ SOLN
25.0000 mg | Freq: Once | INTRAMUSCULAR | Status: AC
  Administered 2016-08-24: 25 mg via INTRAMUSCULAR
  Filled 2016-08-24: qty 1

## 2016-08-24 MED ORDER — DIPHENHYDRAMINE HCL 50 MG/ML IJ SOLN
INTRAMUSCULAR | Status: AC
  Filled 2016-08-24: qty 1

## 2016-08-24 MED ORDER — CLINDAMYCIN HCL 150 MG PO CAPS: 450.0000 mg | ORAL_CAPSULE | Freq: Three times a day (TID) | ORAL | 0 refills | Status: DC

## 2016-08-24 MED FILL — METHOCARBAMOL 500 MG TABLET: 500 | 10 days supply | Qty: 20 | Fill #0

## 2016-08-24 MED FILL — IBUPROFEN 800 MG TABLET: 800 | 7 days supply | Qty: 21 | Fill #0

## 2016-08-24 MED FILL — CLINDAMYCIN HCL 150 MG CAPS: 150 | 7 days supply | Qty: 63 | Fill #0

## 2016-08-24 NOTE — Discharge Instructions (Signed)
Medications: Clindamycin, Phenergan, Robaxin, ibuprofen  Treatment: Take clindamycin as prescribed 3 times daily for 1 week. Take Phenergan twice daily as needed for nausea or vomiting. Take Robaxin twice daily as needed for muscle pain or spasms. Do not drive or operate machinery when taking this medication. Take ibuprofen every 8 hours as prescribed as needed for your pain.  Follow-up: Please see your primary care provider tomorrow for recheck of symptoms. Please return to the emergency department immediately if you develop any new or worsening symptoms.

## 2016-08-24 NOTE — ED Provider Notes (Signed)
[facility] DEPT [facility] Provider Note   CSN: [id number] Arrival date & time: 08/24/16  1424     History   Chief Complaint Chief Complaint  Patient presents with  . Cellulitis    HPI Kari Green is a 51 y.o. female with history of many medical problems including chronic pain and opioid use disorder who presents with suspected insect bite to left arm. Patient reports she was spreading mulch yesterday and had a bite to her left arm. She's had increasing swelling and redness and pain from the area. She feels that the muscles in her left arm are tense and she is having difficulty moving her arm due to it. Patient has also had associated nausea, abdominal cramping, and diarrhea throughout the night. Patient reports she does not sleep last night. Patient has a known allergy to bees and has an EpiPen at home. Patient denies any difficulty breathing or feeling of her throat closing. Patient notes some blisters on her left, upper lip. They are mildly painful. She denies any new medications or sick contacts.  HPI  Past Medical History:  Diagnosis Date  . Acute Pain following procedure (Lumbar Sympathetic Radiofrequency Ablation) 05/06/2015  . Anxiety   . Arthritis    right thumb  . Asthma   . Cancer [facility])    history of skin cancer  . Chronic, continuous use of opioids   . Cognitive dysfunction    patient denies  . Complication of anesthesia    "not asleep and aware of everything"  . Degenerative disc disease, cervical   . Degenerative disc disease, lumbar   . Delayed gastric emptying   . Dental crowns present   . Difficult intravenous access   . Difficulty swallowing pills   . Esophageal dysmotility   . Family history of adverse reaction to anesthesia    family - itching, hives, PONV  . Fibromyalgia   . Hemangioma of liver   . Hemangioma, renal   . History of bronchitis   . History of hiatal hernia   . History of kidney stones 04/2014  . History of pneumonia   .  Hypothyroidism (acquired)   . IC (interstitial cystitis)   . Irritable bowel syndrome (IBS)   . Kidney stone   . Knee pain, right    states gets "nerves burned" periodically  . Limited joint range of motion    right knee - unable to fully extend right leg  . Memory impairment    due to pain   . Migraines   . Osteoporosis   . Pain management   . Pain syndrome, chronic   . Pneumonia 2016  . PONV (postoperative nausea and vomiting)    uncontrollable itching with anesthesia; hx. of anesthesia awareness; pt needs 50  mg of Benadryl  . PONV (postoperative nausea and vomiting)    "need medication before and need medication afterwards"  . Sciatica    bilateral  . Seasonal allergies     Patient Active Problem List   Diagnosis Date Noted  . Intractable chronic migraine without aura and with status migrainosus 07/25/2016  . Venereal wart 07/21/2016  . Vaginal mass 07/21/2016  . Chronic knee pain after total replacement of knee joint (Right) 07/21/2016  . Nasal ulcer 07/05/2016  . S/P shoulder hemiarthroplasty, left 12/04/2015  . Hemoptysis 10/16/2015  . Cough 10/16/2015  . Radiculopathy 07/16/2015  . Cervical lymphadenitis 05/29/2015  . Muscle cramps at night 05/06/2015  . Acute postoperative pain 05/06/2015  . Temporomandibular joint disorder 04/10/2015  .  Buzzing in ear 04/10/2015  . Ear pain, referred 04/10/2015  . OP (osteoporosis) 04/10/2015  . 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/10/2015  . History of migraine headaches 04/10/2015  . Difficulty hearing 04/10/2015  . H/O respiratory system disease 04/10/2015  . Fibromyalgia 04/10/2015  . Deflected nasal septum 04/10/2015  . Chronic otitis externa 04/10/2015  . Abnormal auditory perception 04/10/2015  . Hearing loss 04/10/2015  . History of allergy to IVP dye 01/31/2015  . Chronic low back pain 01/13/2015  . CRPS (complex regional pain syndrome) type I of lower limb (Right-sided) 01/13/2015  . History of arthroplasty of knee  (Right) 01/13/2015  . Chronic pain syndrome 01/13/2015  . Chronic pain 01/13/2015  . Chronic knee pain (Location of Secondary source of pain) (Right) 01/13/2015  . Generalized anxiety disorder 01/13/2015  . Chronic lower extremity pain (Location of Primary Source of Pain) (Right) 01/13/2015  . Multiple drug allergies (codeine, Lidoderm a DC if, sulfa, latex, IVP dye, Keflex, Augmentin) 01/13/2015  . Latex allergy 01/13/2015  . Long term current use of opiate analgesic 01/13/2015  . Long term prescription opiate use 01/13/2015  . Opiate use 01/13/2015  . Opiate dependence ([place]) 01/13/2015  . Moderate persistent asthma 11/15/2014  . Allergic rhinitis 11/15/2014  . Memory deficits 10/24/2013  . Left shoulder pain 03/16/2013  . 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 Hypothyroidism 09/21/2012  . Abdominal adhesions 02/11/2012  . Abdominal pain, epigastric 12/15/2011  . Irritable bowel syndrome with constipation 02/26/2011    Past Surgical History:  Procedure Laterality Date  . ABDOMINAL HYSTERECTOMY    . ANTERIOR CERVICAL DECOMP/DISCECTOMY FUSION  03/10/2009   C5-6, C6-7  . ANTERIOR CERVICAL DECOMP/DISCECTOMY FUSION N/A 07/16/2015   Procedure: ANTERIOR CERVICAL DECOMPRESSION FUSION 4-5 WITH INSTRUMENTATION AND AUTOGRAFT;  Surgeon: Phylliss Bob, MD;  Location: [facility];  Service: Orthopedics;  Laterality: N/A;  ANTERIOR CERVICAL DECOMPRESSION FUSION 4-5 WITH INSTRUMENTATION AND ALLOGRAFT  . APPENDECTOMY  ~ 1992  . AUGMENTATION MAMMAPLASTY Bilateral 1996; 2001, 2016  . BREAST SURGERY  1980's   accessory breast tissue exc.  Marland Kitchen CARPAL TUNNEL RELEASE Right 07/26/2001  . CARPAL TUNNEL RELEASE Left   . CARPOMETACARPEL SUSPENSION PLASTY Right 02/10/2015   Procedure: RIGHT THUMB SUSPENSION  ARTHROPLASTY;  Surgeon: Milly Jakob, MD;  Location: [place];  Service: Orthopedics;  Laterality: Right;  ANESTHESIA: PRE- OP BLOCK  . COLONOSCOPY W/ POLYPECTOMY    . CYSTO WITH  HYDRODISTENSION  01/29/2011   Procedure: CYSTOSCOPY/HYDRODISTENSION;  Surgeon: Claybon Jabs, MD;  Location: [facility];  Service: Urology;  Laterality: N/A;  . CYSTO WITH HYDRODISTENSION N/A 05/08/2014   Procedure: CYSTOSCOPY/HYDRODISTENSION WITH BILATERAL RETROGRADES;  Surgeon: Ardis Hughs, MD;  Location: [facility];  Service: Urology;  Laterality: N/A;  . CYSTO WITH HYDRODISTENSION N/A 03/27/2015   Procedure: CYSTOSCOPY/HYDRODISTENSION OF BLADDER, INSTILLATION OF MARCAINE/PYRIDIUM;  Surgeon: Ardis Hughs, MD;  Location: [facility];  Service: Urology;  Laterality: N/A;  . CYSTO/  BLADDER BX  03/08/2008  . CYSTO/ URETHRAL DILATION/ HYDRODISTENTION/ BLADDER BX  04/10/2003  . CYSTOSCOPY W/ RETROGRADES  01/29/2011   Procedure: CYSTOSCOPY WITH RETROGRADE PYELOGRAM;  Surgeon: Claybon Jabs, MD;  Location: [facility];  Service: Urology;  Laterality: Bilateral;  . CYSTOSCOPY WITH STENT PLACEMENT  2006   URETERAL  . DEBRIDEMENT RIGHT THUMB MP JOINT Right 04/10/2002  . DECOMPRESSION LEFT ULNAR NERVE, ELBOW Left 01/29/2003  . ESOPHAGOGASTRODUODENOSCOPY (EGD) WITH ESOPHAGEAL DILATION  10-26-2000  . EUS N/A  04/23/2016   Procedure: UPPER ENDOSCOPIC ULTRASOUND (EUS) LINEAR;  Surgeon: Carol Ada, MD;  Location: [facility];  Service: Endoscopy;  Laterality: N/A;  . EXCISION HAGLUND'S DEFORMITY WITH ACHILLES TENDON REPAIR Bilateral 2013-2014  . EYE SURGERY Bilateral    x4  . FISSURECTOMY  ~ 1988 x2  . GANGLION CYST EXCISION Right 02/10/2015   Procedure: REMOVAL GANGLION OF WRIST, PARTIAL ULNAR EXCISION;  Surgeon: Milly Jakob, MD;  Location: [place];  Service: Orthopedics;  Laterality: Right;  . HAND SURGERY Right 01-31-15  . HEMORROIDECTOMY    . KIDNEY STONE SURGERY    . KNEE ARTHROSCOPY Right 08/16/2000  . KNEE ARTHROSCOPY W/ LATERAL RELEASE Right 11/29/2000   states 13 surgeries on right knee  . KNEE ARTHROSCOPY  WITH FULKERSON SLIDE Right 11/29/2000  . LAPAROSCOPIC ASSISTED VAGINAL HYSTERECTOMY  Feb 1999  . LAPAROSCOPIC CHOLECYSTECTOMY  03/04/2002  . LAPAROSCOPIC LYSIS OF ADHESIONS  02/11/2012   Procedure: LAPAROSCOPIC LYSIS OF ADHESIONS;  Surgeon: Cheri Fowler, MD;  Location: [facility];  Service: Gynecology;;  . LAPAROSCOPY  02/11/2012   Procedure: LAPAROSCOPY DIAGNOSTIC;  Surgeon: Cheri Fowler, MD;  Location: [facility];  Service: Gynecology;  Laterality: N/A;  Diagnostic  Operative Laparoscopic   . LAPAROSCOPY ADHESIOLYSIS AND REMOVAL POSSIBLE RIGHT OVARY REMNANT  04/18/2000  . LAPAROSCOPY LYSIS ADHESIONS/  BILATERAL SALPINGOOPHORECTOMY/  [place] OF ENDOMETRIOSIS  1998  . MELANOMA EXCISION Left 2013   "behind knee"   . MELANOMA EXCISION     scalp  . NASAL SINUS SURGERY  2013  . NEGATIVE SLEEP STUDY  2006 approx .  per pt  . REFRACTIVE SURGERY Bilateral ~ 1999; ~ 2013  . RIGHT THUMB FUSION OF MPJ Right 10/03/2002  . RIGHT TOTAL KNEE REVISION ARTHROPLASTY Right 03/14/2007  . SHOULDER HEMI-ARTHROPLASTY Left 12/04/2015   Procedure: SHOULDER HEMI-ARTHROPLASTY;  Surgeon: Tania Ade, MD;  Location: [facility];  Service: Orthopedics;  Laterality: Left;  Left shoulder hemiarthroplasty  . SHOULDER SURGERY Left   . TONSILLECTOMY AND ADENOIDECTOMY  ~ 1989  . TOTAL KNEE ARTHROPLASTY Right 09/10/2003; 12/08/2012   x3  . TOTAL THYROIDECTOMY  1993  . TRANSANAL HEMORRHOIDAL DEARTERIALIZATION  03/26/2011  . TRANSTHORACIC ECHOCARDIOGRAM  09/22/2012  . TRIGGER FINGER RELEASE Left 07/06/2016   Procedure: LEFT THUMB TRIGGER RELEASE;  Surgeon: Milly Jakob, MD;  Location: [place];  Service: Orthopedics;  Laterality: Left;  Marland Kitchen VIDEO BRONCHOSCOPY Bilateral 10/20/2015   Procedure: VIDEO BRONCHOSCOPY WITHOUT FLUORO;  Surgeon: Collene Gobble, MD;  Location: [place];  Service: Cardiopulmonary;  Laterality: Bilateral;    OB History    Gravida Para Term Preterm AB Living   0 0 0 0 0     SAB TAB  Ectopic Multiple Live Births   0 0 0           Home Medications    Prior to Admission medications   Medication Sig Start Date End Date Taking? Authorizing Provider  albuterol (PROAIR HFA) 108 (90 Base) MCG/ACT inhaler Inhale 2 puffs into the lungs every 4 (four) hours as needed for wheezing or shortness of breath. 06/14/16   Bobbitt, [place] Muta, MD  albuterol (PROVENTIL) (2.5 MG/3ML) 0.083% nebulizer solution Take 3 mLs (2.5 mg total) by nebulization every 4 (four) hours as needed for wheezing (or coughing spells). 06/14/16   Bobbitt, [place] Muta, MD  alclomethasone (ACLOVATE) 0.05 % cream alclometasone 0.05 % topical cream  APPLY 1 APPLICATION APPLY ON THE SKIN DAILY APPLY SMALL AMOUNT TO AFFECTED AREAS DAILY  [provider]  ALPRAZolam Duanne Moron) 0.5 MG tablet Take 1 mg by mouth 3 (three) times daily as needed. Anxiety    [provider]  Azelastine HCl 0.15 % SOLN Place 1-2 sprays into both nostrils 2 (two) times daily. 06/14/16   Bobbitt, Sed[place] Muta, MD  clindamycin (CLEOCIN) 150 MG capsule Take 3 capsules (450 mg total) by mouth 3 (three) times daily. 08/24/16   Shaneal Barasch, Bea Graff, PA-C  cyanocobalamin (,VITAMIN B-12,) 1000 MCG/ML injection Inject 1,000 mcg into the muscle once a week. Reported on 03/25/2015    [provider]  diphenhydrAMINE (BENADRYL) 50 MG tablet Take 50 mg by mouth every 4 (four) hours as needed for itching.     [provider]  EPINEPHrine (EPIPEN 2-PAK) 0.3 mg/0.3 mL IJ SOAJ injection Inject 0.3 mLs (0.3 mg total) into the muscle as needed (in the event of a severe life-threatening reaction). 12/09/14   Charlies Silvers, MD  estradiol (ESTRACE) 0.5 MG tablet Take 1 mg by mouth at bedtime.     [provider]  EVZIO 0.4 MG/0.4ML SOAJ AUTO INJECT AS NEEDED IN CASE OF EMERGENCY WHERE GRANDCHILDREN MAY GET A HOLD OF PATIENTS PAIN MEDICATIONS. 11/14/14   [provider]  fluticasone (FLONASE) 50 MCG/ACT nasal spray INHALE  2 SPRAYS INTO BOTH NOSTRILS DAILY. 07/05/16   Charlies Silvers, MD  gabapentin (NEURONTIN) 300 MG capsule Take 300 mg by mouth 3 (three) times daily. 03/15/16   [provider]  HYDROmorphone (DILAUDID) 2 MG tablet Take 1-3 tablets (2-6 mg total) by mouth every 4 (four) hours as needed for moderate pain or severe pain. 12/05/15   Grier Mitts, PA-C  ibuprofen (ADVIL,MOTRIN) 800 MG tablet Take 1 tablet (800 mg total) by mouth 3 (three) times daily. 08/24/16   Vandy Tsuchiya, Bea Graff, PA-C  ketoconazole (NIZORAL) 2 % shampoo One application once a month as needed for flaking 11/19/14   [provider]  levothyroxine (SYNTHROID, LEVOTHROID) 50 MCG tablet Take 200 mcg by mouth daily. Pt can only take name brand    [provider]  meloxicam (MOBIC) 15 MG tablet Take 15 mg by mouth daily.    [provider]  methocarbamol (ROBAXIN) 500 MG tablet Take 1 tablet (500 mg total) by mouth 2 (two) times daily. 08/24/16   Kileen Lange, Bea Graff, PA-C  MethylPREDNISolone Acetate (DEPO-MEDROL IJ) Inject 1 each as directed daily as needed (allergic reaction.).    [provider]  mometasone-formoterol (DULERA) 200-5 MCG/ACT AERO Inhale 2 puffs into the lungs 2 (two) times daily. 06/14/16   Bobbitt, [place] Muta, MD  Multiple Vitamin (MULTIVITAMIN WITH MINERALS) TABS tablet Take 1 tablet by mouth at bedtime.    [provider]  OnabotulinumtoxinA (BOTOX IJ) Inject as directed every 3 (three) months. For migraines    [provider]  promethazine (PHENERGAN) 25 MG tablet Take 1-2 tablets (25-50 mg total) by mouth 2 (two) times daily as needed for nausea or vomiting. 08/24/16   Zarin Knupp, Bea Graff, PA-C  propranolol (INDERAL) 20 MG tablet Take 40 mg by mouth daily.  05/12/15   [provider]  topiramate (TOPAMAX) 25 MG tablet Take 200 mg by mouth daily.  04/15/15   [provider]  vitamin C (ASCORBIC ACID) 500 MG tablet Take 1,000 mg by mouth daily.     [provider]    Family History Family History  Problem Relation Age of Onset  . Anesthesia problems Mother   . Cancer Mother  ovarian  . Leukemia Mother   . Cancer Daughter        cervical  . Cancer Maternal Aunt        breast    Social History Social History  Substance Use Topics  . Smoking status: Former Smoker    Quit date: 02/22/2006  . Smokeless tobacco: Never Used  . Alcohol use 0.0 oz/week     Comment: rarely     Allergies   [place]; Latex; Nickel; Peanuts [peanut oil]; Red dye; Azithromycin; Chloraprep one step [chlorhexidine gluconate]; Corticosteroids; Flexeril [cyclobenzaprine]; Morphine and related; Other; Percocet [oxycodone-acetaminophen]; Supartz [sodium hyaluronate]; Surgical lubricant; Yellow dyes (non-tartrazine); Cephalexin; Monistat [miconazole]; Prednisone; Sulfa antibiotics; Yellow dye; Soap; and Tape   Review of Systems Review of Systems  Constitutional: Negative for chills and fever.  HENT: Negative for facial swelling and sore throat.   Respiratory: Negative for shortness of breath.   Cardiovascular: Negative for chest pain.  Gastrointestinal: Positive for abdominal pain (cramping), diarrhea and nausea. Negative for blood in stool and vomiting.  Genitourinary: Negative for dysuria.  Musculoskeletal: Positive for myalgias. Negative for back pain.  Skin: Positive for color change. Negative for rash and wound.  Neurological: Negative for headaches.  Psychiatric/Behavioral: The patient is not nervous/anxious.      Physical Exam Updated Vital Signs BP (!) 141/101 (BP Location: Right Arm)   Pulse 89   Temp 98.2 F (36.8 C) (Oral)   Resp 18   Ht 5\' 3"  (1.6 m)   Wt 59.9 kg (132 lb)   SpO2 99%   BMI 23.38 kg/m   Physical Exam  Constitutional: She appears well-developed and well-nourished. No distress.  HENT:  Head: Normocephalic and atraumatic.  Mouth/Throat: Oropharynx is clear and moist. No oropharyngeal exudate.  2  vesicles on left upper lip, see photo  Eyes: Conjunctivae are normal. Pupils are equal, round, and reactive to light. Right eye exhibits no discharge. Left eye exhibits no discharge. No scleral icterus.  Neck: Normal range of motion. Neck supple. No thyromegaly present.  Cardiovascular: Normal rate, regular rhythm, normal heart sounds and intact distal pulses.  Exam reveals no gallop and no friction rub.   No murmur heard. Pulmonary/Chest: Effort normal and breath sounds normal. No stridor. No respiratory distress. She has no wheezes. She has no rales.  Abdominal: Soft. Bowel sounds are normal. She exhibits no distension. There is generalized tenderness. There is no rebound and no guarding.  Musculoskeletal: She exhibits no edema.  Lymphadenopathy:    She has no cervical adenopathy.  Neurological: She is alert. Coordination normal.  Skin: Skin is warm and dry. No rash noted. She is not diaphoretic. No pallor.  ~7cm area of erythema with central punctate (patient to circle around the area earlier in the day and redness and swelling is spreading beyond circle); area is tender; left upper extremity is tender on palpation  Psychiatric: She has a normal mood and affect.  Nursing note and vitals reviewed.        ED Treatments / Results  Labs (all labs ordered are listed, but only abnormal results are displayed) Labs Reviewed - No data to display  EKG  EKG Interpretation None       Radiology No results found.  Procedures Procedures (including critical care time)  Medications Ordered in ED Medications  ibuprofen (ADVIL,MOTRIN) tablet 800 mg (800 mg Oral Given 08/24/16 1653)  clindamycin (CLEOCIN) capsule 450 mg (450 mg Oral Given 08/24/16 1653)  diphenhydrAMINE (BENADRYL) injection 50 mg (50 mg Intramuscular Given  08/24/16 1653)  promethazine (PHENERGAN) injection 25 mg (25 mg Intramuscular Given 08/24/16 1653)     Initial Impression / Assessment and Plan / ED Course  I have  reviewed the triage vital signs and the nursing notes.  Pertinent labs & imaging results that were available during my care of the patient were reviewed by me and considered in my medical decision making (see chart for details).     Patient with constitutional symptoms that are not completely compatible. Suspect possible black widow spider bite. I offered epinephrine the patient considering technically 2 involved symptoms, however she does not want to stay for observation and does not wish to have epinephrine at this time. Considering around 24 hours following bite, anaphylaxis is unlikely. No intervention indicated for a black widow spider bite. Patient given clindamycin for concern for cellulitis. Also given Robaxin, Phenergan, ibuprofen for symptomatic treatment. Patient given strict return precautions and reasons to use EpiPen at home. Patient advised to follow up with PCP tomorrow for recheck of symptoms. Patient understands and agrees with plan. Patient vitals stable throughout ED course and discharged in satisfactory condition. I discussed patient case with Dr. Billy Fischer who guided the patient's management and agrees with plan.   Final Clinical Impressions(s) / ED Diagnoses   Final diagnoses:  Insect bite, initial encounter  Nausea  Diarrhea, unspecified type    New Prescriptions Discharge Medication List as of 08/24/2016  5:01 PM    START taking these medications   Details  clindamycin (CLEOCIN) 150 MG capsule Take 3 capsules (450 mg total) by mouth 3 (three) times daily., Starting Tue 08/24/2016, Print    ibuprofen (ADVIL,MOTRIN) 800 MG tablet Take 1 tablet (800 mg total) by mouth 3 (three) times daily., Starting Tue 08/24/2016, Print    methocarbamol (ROBAXIN) 500 MG tablet Take 1 tablet (500 mg total) by mouth 2 (two) times daily., Starting Tue 08/24/2016, Print         Cheyan Frees, [place], PA-C 08/24/16 1805    Gareth Morgan, MD 08/25/16 1220

## 2016-08-24 NOTE — ED Notes (Signed)
Awaiting ED provider to eval

## 2016-08-24 NOTE — ED Triage Notes (Signed)
Patient reports that she was "bit" by something yesterday. Patient reports that she also has blisters to her lip and the red area is growing to the bit area

## 2016-08-24 NOTE — ED Notes (Signed)
ED Provider at bedside. 

## 2016-08-30 ENCOUNTER — Telehealth: Payer: Self-pay

## 2016-08-30 NOTE — Telephone Encounter (Signed)
Pt called saying that she had not heard anything about shipment of Aimovig or scheduling Botox. TC transferred to [facility] to discuss Botox. She is still working on Copy for treatments. Called Aimovig support and was told that pt's service request form has been processed and approved for the free trial. Pt should receive call w/in the week from [place] for shipment. Advised pt to answer the call to give verbal permission for pharmacy to ship medication to her. If she does not get a call w/i the week, she will call back for follow-up. Voiced appreciation for call.

## 2016-09-02 ENCOUNTER — Ambulatory Visit: Payer: BLUE CROSS/BLUE SHIELD | Admitting: Pain Medicine

## 2016-09-08 ENCOUNTER — Telehealth: Payer: Self-pay | Admitting: Neurology

## 2016-09-08 NOTE — Telephone Encounter (Signed)
I called patient to schedule injection. She did not answer so I left a VM asking her to call us back.

## 2016-09-09 NOTE — Telephone Encounter (Signed)
Patient called office returning call to schedule Botox.  Please call.

## 2016-09-09 NOTE — Telephone Encounter (Signed)
Patient is already on multiple medications and I don;t think adding another one is the answer. Taking Dilaudid, excedrin, ibuprofen or other acute prescribed or OTC medications daily can be the cause of her chronic daily headache and she should not take these more than 10x a month (combined) to avoid rebound headache. This could be cause of her daily headaches. We will have to wait for the Aimovig to start working and also get the botox on board again. Please let her know, if you want me to call I will. thanks.

## 2016-09-09 NOTE — Telephone Encounter (Signed)
Returned pt's call. Says that she received 2 pens today. Re-educated her on Aimovig shipment and self-administration of 1 pen per month. Let her know that she should get another 2 pens next month but needs to only inject 1 every 30 days. She verbalized understanding. Reports that she's had a headache ever since her last visit in May. Has also had several days of N/V recently and was unable to even keep down ice chips or phenergan. She is waiting for approval of Botox but it may be denied as she has [employer]BS and has already initiated Aimovig. She continues topiramate 200 mg daily and is also taking rx and OTC meds prn for pain. She has multiple allergies/sensitivities to meds including Flexeril, morphine, oxycodone and corticosteroids. She's also tried Neurontin, propranolol, amitriptyline, nortriptyline and believes that she may have had a reaction to Imitrex in the past as well.

## 2016-09-09 NOTE — Telephone Encounter (Signed)
Patient called office in reference to [place].  Patient received medication today, but only got 1 and not 3 as she was told.  Please call

## 2016-09-10 DIAGNOSIS — R05 Cough: Secondary | ICD-10-CM | POA: Diagnosis not present

## 2016-09-15 NOTE — Telephone Encounter (Signed)
Pt called the office reg co-pay for botox. Pt said the last neurologist office she never had to pay for botox. She said it never went thru [place]. She can be reached at [phone].

## 2016-09-15 NOTE — Telephone Encounter (Signed)
Talked to pt who has tolerated 1st dose of Aimovig well. She also received notification from her insurance company that the Heimdal is covered. However, she's being told by [facility] that she'll have to pay a $50 co-pay for her Botox which she has never had to pay before. Would like to stick w/ Botox (w/o co-pays) along w/ a triptan injection (that was also completely covered in the past) if Aimovig coverage is increasing her cost of her other migraine treatments.

## 2016-09-16 ENCOUNTER — Telehealth: Payer: Self-pay | Admitting: Neurology

## 2016-09-16 NOTE — Telephone Encounter (Signed)
I spoke with the patient who was very upset about the copay she has for her botox. She told me several times that she has never had a copay or had to use specialty pharmacy. I informed her that it is the policy of our office to use [facility] when it is available she stated that this was hurting her and causing her to pay 50 dollars because of our offices policy. She stated that she would call to inquire about the copay at the pharmacy.

## 2016-09-16 NOTE — Telephone Encounter (Signed)
I called the patient back to discuss copay and she did not answer. I left a VM asking her to call back.

## 2016-09-21 ENCOUNTER — Ambulatory Visit (INDEPENDENT_AMBULATORY_CARE_PROVIDER_SITE_OTHER): Payer: BLUE CROSS/BLUE SHIELD | Admitting: Neurology

## 2016-09-21 ENCOUNTER — Telehealth: Payer: Self-pay | Admitting: Neurology

## 2016-09-21 VITALS — BP 144/86 | HR 65

## 2016-09-21 DIAGNOSIS — G43711 Chronic migraine without aura, intractable, with status migrainosus: Secondary | ICD-10-CM

## 2016-09-21 NOTE — Telephone Encounter (Signed)
Per Dr. Jaynee Eagles, pt needs botox in 12 weeks. Pt says she can't do thursdays.

## 2016-09-21 NOTE — Progress Notes (Signed)
Botox 100 units/vial x 1 vial from [facility] and 1 vial (to be replaced) from office samples [place] 0981-1914-78 Lot [id number] Exp 03 2021  Diluted in 4 ml of Bacteriostatic 0.9% NaCl NDC 0865-7846-96 Lot [id number]

## 2016-09-22 ENCOUNTER — Telehealth: Payer: Self-pay | Admitting: *Deleted

## 2016-09-22 NOTE — Telephone Encounter (Signed)
PA for Aimovig started on Cover My Meds.

## 2016-09-22 NOTE — Progress Notes (Signed)

## 2016-09-23 NOTE — Telephone Encounter (Signed)
Received fax; PA denied for Amovig.

## 2016-09-24 NOTE — Telephone Encounter (Signed)
I called and scheduled the patient for her next injection.  °

## 2016-10-06 NOTE — Telephone Encounter (Signed)
Pt called to inform that she was just informed of the insurance company not covering the injection suggested by Dr Jaynee Eagles.  Pt is asking for a call with what else may be suggested for her migraines due to her vomiting for the last 3 days.  Pt said there was an injection given to her by her previous neurologist (Dr Tomi Likens) that helped, pt will reach out to the pharmacy and call back with the name in hopes that Dr Jaynee Eagles will consider it for her.  Pt will call back, not requesting a call back at this time

## 2016-10-07 NOTE — Telephone Encounter (Signed)
Aimovig PA appeal form completed and faxed to [employer] F #  [phone].

## 2016-10-11 ENCOUNTER — Telehealth: Payer: Self-pay | Admitting: Neurology

## 2016-10-11 DIAGNOSIS — G894 Chronic pain syndrome: Secondary | ICD-10-CM | POA: Diagnosis not present

## 2016-10-11 DIAGNOSIS — M79604 Pain in right leg: Secondary | ICD-10-CM | POA: Diagnosis not present

## 2016-10-11 DIAGNOSIS — M542 Cervicalgia: Secondary | ICD-10-CM | POA: Diagnosis not present

## 2016-10-11 DIAGNOSIS — M545 Low back pain: Secondary | ICD-10-CM | POA: Diagnosis not present

## 2016-10-11 DIAGNOSIS — M25561 Pain in right knee: Secondary | ICD-10-CM | POA: Diagnosis not present

## 2016-10-11 DIAGNOSIS — G43109 Migraine with aura, not intractable, without status migrainosus: Secondary | ICD-10-CM | POA: Diagnosis not present

## 2016-10-11 DIAGNOSIS — M25551 Pain in right hip: Secondary | ICD-10-CM | POA: Diagnosis not present

## 2016-10-11 DIAGNOSIS — M79661 Pain in right lower leg: Secondary | ICD-10-CM | POA: Diagnosis not present

## 2016-10-11 NOTE — Telephone Encounter (Signed)
[email or web address]@ [email or web address]  In prior authorization dept is asking for a call back re: pt taking Botox, she wants to know if pt will continue on Botox and if not when was the last pt had it, please call [email or web address]@ [phone]

## 2016-10-13 ENCOUNTER — Ambulatory Visit (INDEPENDENT_AMBULATORY_CARE_PROVIDER_SITE_OTHER): Payer: BLUE CROSS/BLUE SHIELD | Admitting: Podiatry

## 2016-10-13 ENCOUNTER — Encounter: Payer: Self-pay | Admitting: Podiatry

## 2016-10-13 VITALS — BP 121/70 | HR 99

## 2016-10-13 DIAGNOSIS — L600 Ingrowing nail: Secondary | ICD-10-CM | POA: Diagnosis not present

## 2016-10-13 NOTE — Progress Notes (Signed)
   Subjective:    Patient ID: Kari Green, female    DOB: 08/25/1965, 51 y.o.   MRN: [id number]  HPI    Review of Systems  All other systems reviewed and are negative.      Objective:   Physical Exam        Assessment & Plan:

## 2016-10-13 NOTE — Patient Instructions (Signed)

## 2016-10-14 DIAGNOSIS — M47812 Spondylosis without myelopathy or radiculopathy, cervical region: Secondary | ICD-10-CM | POA: Diagnosis not present

## 2016-10-14 NOTE — Telephone Encounter (Signed)
Kari Green wanted to know if the patient was still currently taking botox because she is trying to get her approved for aimovig. I gave her the last injection date for the patient.

## 2016-10-14 NOTE — Progress Notes (Signed)
Subjective:    Patient ID: Kari Green, female   DOB: 51 y.o.   MRN: [id number]   HPI patient presents with significant ingrown toenail deformity of the hallux bilateral and states that they've been increasingly sore    Review of Systems  All other systems reviewed and are negative.       Objective:  Physical Exam  Constitutional: She appears well-developed and well-nourished.  Cardiovascular: Intact distal pulses.   Musculoskeletal: Normal range of motion.  Neurological: She is alert.  Skin: Skin is warm.  Nursing note and vitals reviewed.  neurovascular status was found to be intact with patient found to have significant incurvation of the hallux nails bilateral both medial lateral borders that are painful when pressed and makes shoe gear difficult. 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 I've recommended removal of the nail corners and I explained there is no guarantee as to the health of the remaining nail and she may eventually lose the entire nail and I did discuss the risk of surgery. Patient wants procedures and at this time I infiltrated each hallux 60 mg like Marcaine mixture remove the corners the hallux nail bilateral exposed matrix and applied phenol 3 applications 30 seconds followed by alcohol lavaged sterile dressing. Gave instructions on soaks and reappoint

## 2016-10-14 NOTE — Telephone Encounter (Signed)
I called Marci back but she did not answer. I left a VM asking her to call me back if need be.

## 2016-10-18 DIAGNOSIS — R609 Edema, unspecified: Secondary | ICD-10-CM | POA: Diagnosis not present

## 2016-10-18 DIAGNOSIS — R042 Hemoptysis: Secondary | ICD-10-CM | POA: Diagnosis not present

## 2016-10-18 DIAGNOSIS — E538 Deficiency of other specified B group vitamins: Secondary | ICD-10-CM | POA: Diagnosis not present

## 2016-10-18 DIAGNOSIS — M810 Age-related osteoporosis without current pathological fracture: Secondary | ICD-10-CM | POA: Diagnosis not present

## 2016-10-18 DIAGNOSIS — M879 Osteonecrosis, unspecified: Secondary | ICD-10-CM | POA: Diagnosis not present

## 2016-10-19 ENCOUNTER — Other Ambulatory Visit (HOSPITAL_COMMUNITY): Payer: Self-pay | Admitting: Internal Medicine

## 2016-10-19 DIAGNOSIS — I878 Other specified disorders of veins: Secondary | ICD-10-CM

## 2016-10-19 DIAGNOSIS — M879 Osteonecrosis, unspecified: Secondary | ICD-10-CM

## 2016-10-19 DIAGNOSIS — R042 Hemoptysis: Secondary | ICD-10-CM

## 2016-10-19 DIAGNOSIS — M810 Age-related osteoporosis without current pathological fracture: Secondary | ICD-10-CM

## 2016-10-19 DIAGNOSIS — R609 Edema, unspecified: Secondary | ICD-10-CM

## 2016-10-26 DIAGNOSIS — R310 Gross hematuria: Secondary | ICD-10-CM | POA: Diagnosis not present

## 2016-10-26 DIAGNOSIS — R1084 Generalized abdominal pain: Secondary | ICD-10-CM | POA: Diagnosis not present

## 2016-10-26 DIAGNOSIS — N3020 Other chronic cystitis without hematuria: Secondary | ICD-10-CM | POA: Diagnosis not present

## 2016-11-01 DIAGNOSIS — R1084 Generalized abdominal pain: Secondary | ICD-10-CM | POA: Diagnosis not present

## 2016-11-01 DIAGNOSIS — R310 Gross hematuria: Secondary | ICD-10-CM | POA: Diagnosis not present

## 2016-11-01 DIAGNOSIS — N200 Calculus of kidney: Secondary | ICD-10-CM | POA: Diagnosis not present

## 2016-11-02 ENCOUNTER — Other Ambulatory Visit: Payer: Self-pay | Admitting: Student

## 2016-11-03 ENCOUNTER — Other Ambulatory Visit: Payer: Self-pay | Admitting: Radiology

## 2016-11-03 ENCOUNTER — Other Ambulatory Visit: Payer: Self-pay | Admitting: General Surgery

## 2016-11-04 ENCOUNTER — Ambulatory Visit (HOSPITAL_COMMUNITY)
Admission: RE | Admit: 2016-11-04 | Discharge: 2016-11-04 | Disposition: A | Discharge status: Home / Self Care | Payer: BLUE CROSS/BLUE SHIELD | Source: Ambulatory Visit | Attending: Internal Medicine | Admitting: Internal Medicine

## 2016-11-04 ENCOUNTER — Encounter (HOSPITAL_COMMUNITY): Payer: Self-pay

## 2016-11-04 ENCOUNTER — Other Ambulatory Visit (HOSPITAL_COMMUNITY): Payer: Self-pay | Admitting: Internal Medicine

## 2016-11-04 DIAGNOSIS — M5136 Other intervertebral disc degeneration, lumbar region: Secondary | ICD-10-CM | POA: Insufficient documentation

## 2016-11-04 DIAGNOSIS — Z91040 Latex allergy status: Secondary | ICD-10-CM | POA: Insufficient documentation

## 2016-11-04 DIAGNOSIS — G894 Chronic pain syndrome: Secondary | ICD-10-CM | POA: Insufficient documentation

## 2016-11-04 DIAGNOSIS — Z885 Allergy status to narcotic agent status: Secondary | ICD-10-CM | POA: Diagnosis not present

## 2016-11-04 DIAGNOSIS — Z981 Arthrodesis status: Secondary | ICD-10-CM | POA: Diagnosis not present

## 2016-11-04 DIAGNOSIS — Z7951 Long term (current) use of inhaled steroids: Secondary | ICD-10-CM | POA: Diagnosis not present

## 2016-11-04 DIAGNOSIS — F419 Anxiety disorder, unspecified: Secondary | ICD-10-CM | POA: Diagnosis not present

## 2016-11-04 DIAGNOSIS — I878 Other specified disorders of veins: Secondary | ICD-10-CM

## 2016-11-04 DIAGNOSIS — M810 Age-related osteoporosis without current pathological fracture: Secondary | ICD-10-CM

## 2016-11-04 DIAGNOSIS — R042 Hemoptysis: Secondary | ICD-10-CM

## 2016-11-04 DIAGNOSIS — M879 Osteonecrosis, unspecified: Secondary | ICD-10-CM

## 2016-11-04 DIAGNOSIS — Z882 Allergy status to sulfonamides status: Secondary | ICD-10-CM | POA: Insufficient documentation

## 2016-11-04 DIAGNOSIS — M5431 Sciatica, right side: Secondary | ICD-10-CM | POA: Insufficient documentation

## 2016-11-04 DIAGNOSIS — Z452 Encounter for adjustment and management of vascular access device: Secondary | ICD-10-CM | POA: Diagnosis not present

## 2016-11-04 DIAGNOSIS — E039 Hypothyroidism, unspecified: Secondary | ICD-10-CM | POA: Insufficient documentation

## 2016-11-04 DIAGNOSIS — J45909 Unspecified asthma, uncomplicated: Secondary | ICD-10-CM | POA: Insufficient documentation

## 2016-11-04 DIAGNOSIS — C801 Malignant (primary) neoplasm, unspecified: Secondary | ICD-10-CM

## 2016-11-04 DIAGNOSIS — K589 Irritable bowel syndrome without diarrhea: Secondary | ICD-10-CM | POA: Insufficient documentation

## 2016-11-04 DIAGNOSIS — M5432 Sciatica, left side: Secondary | ICD-10-CM | POA: Diagnosis not present

## 2016-11-04 DIAGNOSIS — G43909 Migraine, unspecified, not intractable, without status migrainosus: Secondary | ICD-10-CM | POA: Insufficient documentation

## 2016-11-04 DIAGNOSIS — R609 Edema, unspecified: Secondary | ICD-10-CM

## 2016-11-04 DIAGNOSIS — Z87891 Personal history of nicotine dependence: Secondary | ICD-10-CM | POA: Insufficient documentation

## 2016-11-04 DIAGNOSIS — M5030 Other cervical disc degeneration, unspecified cervical region: Secondary | ICD-10-CM | POA: Insufficient documentation

## 2016-11-04 DIAGNOSIS — Z95828 Presence of other vascular implants and grafts: Secondary | ICD-10-CM

## 2016-11-04 DIAGNOSIS — R600 Localized edema: Secondary | ICD-10-CM

## 2016-11-04 DIAGNOSIS — M797 Fibromyalgia: Secondary | ICD-10-CM | POA: Diagnosis not present

## 2016-11-04 HISTORY — PX: IR US GUIDE VASC ACCESS RIGHT: IMG2390

## 2016-11-04 HISTORY — DX: Presence of other vascular implants and grafts: Z95.828

## 2016-11-04 HISTORY — PX: IR FLUORO GUIDE PORT INSERTION RIGHT: IMG5741

## 2016-11-04 LAB — CBC
HCT: 39.2 % (ref 36.0–46.0)
HEMOGLOBIN: 13.6 g/dL (ref 12.0–15.0)
MCH: 29.6 pg (ref 26.0–34.0)
MCHC: 34.7 g/dL (ref 30.0–36.0)
MCV: 85.2 fL (ref 78.0–100.0)
Platelets: 307 10*3/uL (ref 150–400)
RBC: 4.6 MIL/uL (ref 3.87–5.11)
RDW: 13.7 % (ref 11.5–15.5)
WBC: 5.9 10*3/uL (ref 4.0–10.5)

## 2016-11-04 LAB — PROTIME-INR
INR: 0.97
Prothrombin Time: 12.8 seconds (ref 11.4–15.2)

## 2016-11-04 LAB — APTT: aPTT: 32 seconds (ref 24–36)

## 2016-11-04 MED ORDER — DIPHENHYDRAMINE HCL 50 MG/ML IJ SOLN
INTRAMUSCULAR | Status: AC
  Administered 2016-11-04: 50 mg
  Filled 2016-11-04: qty 1

## 2016-11-04 MED ORDER — HEPARIN SOD (PORK) LOCK FLUSH 100 UNIT/ML IV SOLN
INTRAVENOUS | Status: AC
  Filled 2016-11-04: qty 5

## 2016-11-04 MED ORDER — MIDAZOLAM HCL 2 MG/2ML IJ SOLN
INTRAMUSCULAR | Status: AC | PRN
  Administered 2016-11-04 (×3): 1 mg via INTRAVENOUS

## 2016-11-04 MED ORDER — LIDOCAINE HCL (PF) 2 % IJ SOLN
INTRAMUSCULAR | Status: AC
  Filled 2016-11-04: qty 20

## 2016-11-04 MED ORDER — HEPARIN SOD (PORK) LOCK FLUSH 100 UNIT/ML IV SOLN
INTRAVENOUS | Status: AC | PRN
  Administered 2016-11-04: 500 [IU] via INTRAVENOUS

## 2016-11-04 MED ORDER — MIDAZOLAM HCL 2 MG/2ML IJ SOLN
INTRAMUSCULAR | Status: AC
  Filled 2016-11-04: qty 6

## 2016-11-04 MED ORDER — PROMETHAZINE HCL 25 MG/ML IJ SOLN
INTRAMUSCULAR | Status: AC
  Administered 2016-11-04: 25 mg via INTRAVENOUS
  Filled 2016-11-04: qty 1

## 2016-11-04 MED ORDER — FENTANYL CITRATE (PF) 100 MCG/2ML IJ SOLN
INTRAMUSCULAR | Status: AC | PRN
  Administered 2016-11-04 (×2): 50 ug via INTRAVENOUS

## 2016-11-04 MED ORDER — LIDOCAINE HCL (PF) 1 % IJ SOLN
INTRAMUSCULAR | Status: AC | PRN
  Administered 2016-11-04: 15 mL

## 2016-11-04 MED ORDER — FENTANYL CITRATE (PF) 100 MCG/2ML IJ SOLN
INTRAMUSCULAR | Status: AC
  Filled 2016-11-04: qty 4

## 2016-11-04 MED ORDER — VANCOMYCIN HCL IN DEXTROSE 1-5 GM/200ML-% IV SOLN
INTRAVENOUS | Status: AC
  Administered 2016-11-04: 1000 mg via INTRAVENOUS
  Filled 2016-11-04: qty 200

## 2016-11-04 MED ORDER — VANCOMYCIN HCL IN DEXTROSE 1-5 GM/200ML-% IV SOLN
1000.0000 mg | Freq: Once | INTRAVENOUS | Status: AC
  Administered 2016-11-04: 1000 mg via INTRAVENOUS

## 2016-11-04 MED ORDER — SODIUM CHLORIDE 0.9 % IV SOLN
INTRAVENOUS | Status: DC
  Administered 2016-11-04: 13:00:00 via INTRAVENOUS

## 2016-11-04 NOTE — Procedures (Signed)
RIJV PAC SVC RA EBL 0 Comp 0 

## 2016-11-04 NOTE — Progress Notes (Signed)
Contacted Dr Barbie Banner per pt request due to itching; pt has had complete course of action; max dose of benadryl and phenergan and can not have anything further per Dr Barbie Banner. Pt aware.

## 2016-11-04 NOTE — Discharge Instructions (Signed)
Implanted Port Home Guide °An implanted port is a type of central line that is placed under the skin. Central lines are used to provide IV access when treatment or nutrition needs to be given through a person’s veins. Implanted ports are used for long-term IV access. An implanted port may be placed because: °· You need IV medicine that would be irritating to the small veins in your hands or arms. °· You need long-term IV medicines, such as antibiotics. °· You need IV nutrition for a long period. °· You need frequent blood draws for lab tests. °· You need dialysis. ° °Implanted ports are usually placed in the chest area, but they can also be placed in the upper arm, the abdomen, or the leg. An implanted port has two main parts: °· Reservoir. The reservoir is round and will appear as a small, raised area under your skin. The reservoir is the part where a needle is inserted to give medicines or draw blood. °· Catheter. The catheter is a thin, flexible tube that extends from the reservoir. The catheter is placed into a large vein. Medicine that is inserted into the reservoir goes into the catheter and then into the vein. ° °How will I care for my incision site? °Do not get the incision site wet. Bathe or shower as directed by your health care provider. °How is my port accessed? °Special steps must be taken to access the port: °· Before the port is accessed, a numbing cream can be placed on the skin. This helps numb the skin over the port site. °· Your health care provider uses a sterile technique to access the port. °? Your health care provider must put on a mask and sterile gloves. °? The skin over your port is cleaned carefully with an antiseptic and allowed to dry. °? The port is gently pinched between sterile gloves, and a needle is inserted into the port. °· Only "non-coring" port needles should be used to access the port. Once the port is accessed, a blood return should be checked. This helps ensure that the port  is in the vein and is not clogged. °· If your port needs to remain accessed for a constant infusion, a clear (transparent) bandage will be placed over the needle site. The bandage and needle will need to be changed every week, or as directed by your health care provider. °· Keep the bandage covering the needle clean and dry. Do not get it wet. Follow your health care provider’s instructions on how to take a shower or bath while the port is accessed. °· If your port does not need to stay accessed, no bandage is needed over the port. ° °What is flushing? °Flushing helps keep the port from getting clogged. Follow your health care provider’s instructions on how and when to flush the port. Ports are usually flushed with saline solution or a medicine called heparin. The need for flushing will depend on how the port is used. °· If the port is used for intermittent medicines or blood draws, the port will need to be flushed: °? After medicines have been given. °? After blood has been drawn. °? As part of routine maintenance. °· If a constant infusion is running, the port may not need to be flushed. ° °How long will my port stay implanted? °The port can stay in for as long as your health care provider thinks it is needed. When it is time for the port to come out, surgery will be   done to remove it. The procedure is similar to the one performed when the port was put in. °When should I seek immediate medical care? °When you have an implanted port, you should seek immediate medical care if: °· You notice a bad smell coming from the incision site. °· You have swelling, redness, or drainage at the incision site. °· You have more swelling or pain at the port site or the surrounding area. °· You have a fever that is not controlled with medicine. ° °This information is not intended to replace advice given to you by your health care provider. Make sure you discuss any questions you have with your health care provider. °Document  Released: 02/08/2005 Document Revised: 07/17/2015 Document Reviewed: 10/16/2012 °Elsevier Interactive Patient Education © 2017 Else[employer] °Moderate Conscious Sedation, Adult, Care After °These instructions provide you with information about caring for yourself after your procedure. Your health care provider may also give you more specific instructions. Your treatment has been planned according to current medical practices, but problems sometimes occur. Call your health care provider if you have any problems or questions after your procedure. °What can I expect after the procedure? °After your procedure, it is common: °· To feel sleepy for several hours. °· To feel clumsy and have poor balance for several hours. °· To have poor judgment for several hours. °· To vomit if you eat too soon. ° °Follow these instructions at home: °For at least 24 hours after the procedure: ° °· Do not: °? Participate in activities where you could fall or become injured. °? Drive. °? Use heavy machinery. °? Drink alcohol. °? Take sleeping pills or medicines that cause drowsiness. °? Make important decisions or sign legal documents. °? Take care of children on your own. °· Rest. °Eating and drinking °· Follow the diet recommended by your health care provider. °· If you vomit: °? Drink water, juice, or soup when you can drink without vomiting. °? Make sure you have little or no nausea before eating solid foods. °General instructions °· Have a responsible adult stay with you until you are awake and alert. °· Take over-the-counter and prescription medicines only as told by your health care provider. °· If you smoke, do not smoke without supervision. °· Keep all follow-up visits as told by your health care provider. This is important. °Contact a health care provider if: °· You keep feeling nauseous or you keep vomiting. °· You feel light-headed. °· You develop a rash. °· You have a fever. °Get help right away if: °· You have trouble  breathing. °This information is not intended to replace advice given to you by your health care provider. Make sure you discuss any questions you have with your health care provider. °Document Released: 11/29/2012 Document Revised: 07/14/2015 Document Reviewed: 05/31/2015 °Elsevier Interactive Patient Education © 2018 Elsevier Inc. ° °

## 2016-11-04 NOTE — Consult Note (Signed)
Chief Complaint: Patient was seen in consultation today for Port-A-Cath placement  Referring Physician(s): [place]  Supervising Physician: Marybelle Killings  Patient Status: [facility]  History of Present Illness: Kari Green is a 51 y.o. female with history of chronic pain syndrome as well as osteonecrosis and poor venous access. Request received from primary care team for Port-A-Cath placement for multiple blood draws, medications and sedative administration during procedures.  Past Medical History:  Diagnosis Date  . Acute Pain following procedure (Lumbar Sympathetic Radiofrequency Ablation) 05/06/2015  . Anxiety   . Arthritis    right thumb  . Asthma   . Cancer [facility])    history of skin cancer  . Chronic, continuous use of opioids   . Cognitive dysfunction    patient denies  . Complication of anesthesia    "not asleep and aware of everything"  . Degenerative disc disease, cervical   . Degenerative disc disease, lumbar   . Delayed gastric emptying   . Dental crowns present   . Difficult intravenous access   . Difficulty swallowing pills   . Esophageal dysmotility   . Family history of adverse reaction to anesthesia    family - itching, hives, PONV  . Fibromyalgia   . Hemangioma of liver   . Hemangioma, renal   . History of bronchitis   . History of hiatal hernia   . History of kidney stones 04/2014  . History of pneumonia   . Hypothyroidism (acquired)   . IC (interstitial cystitis)   . Irritable bowel syndrome (IBS)   . Kidney stone   . Knee pain, right    states gets "nerves burned" periodically  . Limited joint range of motion    right knee - unable to fully extend right leg  . Memory impairment    due to pain   . Migraines   . Osteoporosis   . Pain management   . Pain syndrome, chronic   . Pneumonia 2016  . PONV (postoperative nausea and vomiting)    uncontrollable itching with anesthesia; hx. of anesthesia awareness; pt needs 50  mg of  Benadryl  . PONV (postoperative nausea and vomiting)    "need medication before and need medication afterwards"  . Sciatica    bilateral  . Seasonal allergies     Past Surgical History:  Procedure Laterality Date  . ABDOMINAL HYSTERECTOMY    . ANTERIOR CERVICAL DECOMP/DISCECTOMY FUSION  03/10/2009   C5-6, C6-7  . ANTERIOR CERVICAL DECOMP/DISCECTOMY FUSION N/A 07/16/2015   Procedure: ANTERIOR CERVICAL DECOMPRESSION FUSION 4-5 WITH INSTRUMENTATION AND AUTOGRAFT;  Surgeon: Phylliss Bob, MD;  Location: [place];  Service: Orthopedics;  Laterality: N/A;  ANTERIOR CERVICAL DECOMPRESSION FUSION 4-5 WITH INSTRUMENTATION AND ALLOGRAFT  . APPENDECTOMY  ~ 1992  . AUGMENTATION MAMMAPLASTY Bilateral 1996; 2001, 2016  . BREAST SURGERY  1980's   accessory breast tissue exc.  Marland Kitchen CARPAL TUNNEL RELEASE Right 07/26/2001  . CARPAL TUNNEL RELEASE Left   . CARPOMETACARPEL SUSPENSION PLASTY Right 02/10/2015   Procedure: RIGHT THUMB SUSPENSION  ARTHROPLASTY;  Surgeon: Milly Jakob, MD;  Location: [place];  Service: Orthopedics;  Laterality: Right;  ANESTHESIA: PRE- OP BLOCK  . COLONOSCOPY W/ POLYPECTOMY    . CYSTO WITH HYDRODISTENSION  01/29/2011   Procedure: CYSTOSCOPY/HYDRODISTENSION;  Surgeon: Claybon Jabs, MD;  Location: [facility];  Service: Urology;  Laterality: N/A;  . CYSTO WITH HYDRODISTENSION N/A 05/08/2014   Procedure: CYSTOSCOPY/HYDRODISTENSION WITH BILATERAL RETROGRADES;  Surgeon: Ardis Hughs, MD;  Location: [place];  Service: Urology;  Laterality: N/A;  . CYSTO WITH HYDRODISTENSION N/A 03/27/2015   Procedure: CYSTOSCOPY/HYDRODISTENSION OF BLADDER, INSTILLATION OF MARCAINE/PYRIDIUM;  Surgeon: Ardis Hughs, MD;  Location: [facility];  Service: Urology;  Laterality: N/A;  . CYSTO/  BLADDER BX  03/08/2008  . CYSTO/ URETHRAL DILATION/ HYDRODISTENTION/ BLADDER BX  04/10/2003  . CYSTOSCOPY W/ RETROGRADES  01/29/2011   Procedure:  CYSTOSCOPY WITH RETROGRADE PYELOGRAM;  Surgeon: Claybon Jabs, MD;  Location: [facility];  Service: Urology;  Laterality: Bilateral;  . CYSTOSCOPY WITH STENT PLACEMENT  2006   URETERAL  . DEBRIDEMENT RIGHT THUMB MP JOINT Right 04/10/2002  . DECOMPRESSION LEFT ULNAR NERVE, ELBOW Left 01/29/2003  . ESOPHAGOGASTRODUODENOSCOPY (EGD) WITH ESOPHAGEAL DILATION  10-26-2000  . EUS N/A 04/23/2016   Procedure: UPPER ENDOSCOPIC ULTRASOUND (EUS) LINEAR;  Surgeon: Carol Ada, MD;  [facility];  Service: Endoscopy;  Laterality: N/A;  . EXCISION HAGLUND'S DEFORMITY WITH ACHILLES TENDON REPAIR Bilateral 2013-2014  . EYE SURGERY Bilateral    x4  . FISSURECTOMY  ~ 1988 x2  . GANGLION CYST EXCISION Right 02/10/2015   Procedure: REMOVAL GANGLION OF WRIST, PARTIAL ULNAR EXCISION;  Surgeon: Milly Jakob, MD;  Location: [place];  Service: Orthopedics;  Laterality: Right;  . HAND SURGERY Right 01-31-15  . HEMORROIDECTOMY    . KIDNEY STONE SURGERY    . KNEE ARTHROSCOPY Right 08/16/2000  . KNEE ARTHROSCOPY W/ LATERAL RELEASE Right 11/29/2000   states 13 surgeries on right knee  . KNEE ARTHROSCOPY WITH FULKERSON SLIDE Right 11/29/2000  . LAPAROSCOPIC ASSISTED VAGINAL HYSTERECTOMY  Feb 1999  . LAPAROSCOPIC CHOLECYSTECTOMY  03/04/2002  . LAPAROSCOPIC LYSIS OF ADHESIONS  02/11/2012   Procedure: LAPAROSCOPIC LYSIS OF ADHESIONS;  Surgeon: Cheri Fowler, MD;  [facility];  Service: Gynecology;;  . LAPAROSCOPY  02/11/2012   Procedure: LAPAROSCOPY DIAGNOSTIC;  Surgeon: Cheri Fowler, MD;  [facility];  Service: Gynecology;  Laterality: N/A;  Diagnostic  Operative Laparoscopic   . LAPAROSCOPY ADHESIOLYSIS AND REMOVAL POSSIBLE RIGHT OVARY REMNANT  04/18/2000  . LAPAROSCOPY LYSIS ADHESIONS/  BILATERAL SALPINGOOPHORECTOMY/  [place] OF ENDOMETRIOSIS  1998  . MELANOMA EXCISION Left 2013   "behind knee"   . MELANOMA EXCISION     scalp  . NASAL SINUS SURGERY  2013  .  NEGATIVE SLEEP STUDY  2006 approx .  per pt  . REFRACTIVE SURGERY Bilateral ~ 1999; ~ 2013  . RIGHT THUMB FUSION OF MPJ Right 10/03/2002  . RIGHT TOTAL KNEE REVISION ARTHROPLASTY Right 03/14/2007  . SHOULDER HEMI-ARTHROPLASTY Left 12/04/2015   Procedure: SHOULDER HEMI-ARTHROPLASTY;  Surgeon: Tania Ade, MD;  Location: [place];  Service: Orthopedics;  Laterality: Left;  Left shoulder hemiarthroplasty  . SHOULDER SURGERY Left   . TONSILLECTOMY AND ADENOIDECTOMY  ~ 1989  . TOTAL KNEE ARTHROPLASTY Right 09/10/2003; 12/08/2012   x3  . TOTAL THYROIDECTOMY  1993  . TRANSANAL HEMORRHOIDAL DEARTERIALIZATION  03/26/2011  . TRANSTHORACIC ECHOCARDIOGRAM  09/22/2012  . TRIGGER FINGER RELEASE Left 07/06/2016   Procedure: LEFT THUMB TRIGGER RELEASE;  Surgeon: Milly Jakob, MD;  Location: [place];  Service: Orthopedics;  Laterality: Left;  Marland Kitchen VIDEO BRONCHOSCOPY Bilateral 10/20/2015   Procedure: VIDEO BRONCHOSCOPY WITHOUT FLUORO;  Surgeon: Collene Gobble, MD;  Location: [place];  Service: Cardiopulmonary;  Laterality: Bilateral;    Allergies: Cobalt; Latex; Nickel; Peanuts [peanut oil]; Red dye; Azithromycin; Chloraprep one step [chlorhexidine gluconate]; Corticosteroids; Flexeril [cyclobenzaprine]; Morphine and related; Other; Percocet [oxycodone-acetaminophen];  Supartz [sodium hyaluronate]; Surgical lubricant; Yellow dyes (non-tartrazine); Cephalexin; Monistat [miconazole]; Prednisone; Sulfa antibiotics; Yellow dye; Soap; and Tape  Medications: Prior to Admission medications   Medication Sig Start Date End Date Taking? Authorizing Provider  albuterol (PROAIR HFA) 108 (90 Base) MCG/ACT inhaler Inhale 2 puffs into the lungs every 4 (four) hours as needed for wheezing or shortness of breath. 06/14/16  Yes Bobbitt, Sedalia Muta, MD  ALPRAZolam Duanne Moron) 0.5 MG tablet Take 1 mg by mouth 3 (three) times daily as needed. Anxiety   Yes [provider]  Azelastine HCl 0.15 % SOLN Place  1-2 sprays into both nostrils 2 (two) times daily. 06/14/16  Yes Bobbitt, Sedalia Muta, MD  cyanocobalamin (,VITAMIN B-12,) 1000 MCG/ML injection Inject 1,000 mcg into the muscle once a week. Reported on 03/25/2015   Yes [provider]  diphenhydrAMINE (BENADRYL) 50 MG tablet Take 50 mg by mouth every 4 (four) hours as needed for itching.    Yes [provider]  estradiol (ESTRACE) 0.5 MG tablet Take 1 mg by mouth at bedtime.    Yes [provider]  fluticasone (FLONASE) 50 MCG/ACT nasal spray INHALE 2 SPRAYS INTO BOTH NOSTRILS DAILY. 07/05/16  Yes Bardelas, Jens Som, MD  ketoconazole (NIZORAL) 2 % shampoo One application once a month as needed for flaking 11/19/14  Yes [provider]  levothyroxine (SYNTHROID, LEVOTHROID) 50 MCG tablet Take 200 mcg by mouth daily. Pt can only take name brand   Yes [provider]  meloxicam (MOBIC) 15 MG tablet Take 15 mg by mouth daily.   Yes [provider]  methocarbamol (ROBAXIN) 500 MG tablet Take 1 tablet (500 mg total) by mouth 2 (two) times daily. 08/24/16  Yes Law, Bea Graff, PA-C  promethazine (PHENERGAN) 25 MG tablet Take 1-2 tablets (25-50 mg total) by mouth 2 (two) times daily as needed for nausea or vomiting. 08/24/16  Yes Law, Bea Graff, PA-C  propranolol (INDERAL) 20 MG tablet Take 40 mg by mouth daily.  05/12/15  Yes [provider]  topiramate (TOPAMAX) 25 MG tablet Take 200 mg by mouth daily.  04/15/15  Yes [provider]  vitamin C (ASCORBIC ACID) 500 MG tablet Take 1,000 mg by mouth daily.   Yes [provider]  albuterol (PROVENTIL) (2.5 MG/3ML) 0.083% nebulizer solution Take 3 mLs (2.5 mg total) by nebulization every 4 (four) hours as needed for wheezing (or coughing spells). 06/14/16   Bobbitt, Sedalia Muta, MD  alclomethasone (ACLOVATE) 0.05 % cream alclometasone 0.05 % topical cream  APPLY 1 APPLICATION APPLY ON THE SKIN DAILY APPLY SMALL AMOUNT TO AFFECTED AREAS  DAILY    [provider]  clindamycin (CLEOCIN) 150 MG capsule Take 3 capsules (450 mg total) by mouth 3 (three) times daily. 08/24/16   Law, Bea Graff, PA-C  EPINEPHrine (EPIPEN 2-PAK) 0.3 mg/0.3 mL IJ SOAJ injection Inject 0.3 mLs (0.3 mg total) into the muscle as needed (in the event of a severe life-threatening reaction). 12/09/14   Charlies Silvers, MD  Erenumab-aooe (AIMOVIG) 70 MG/ML SOAJ Inject 70 mg into the skin every 30 (thirty) days.    [provider]  EVZIO 0.4 MG/0.4ML SOAJ AUTO INJECT AS NEEDED IN CASE OF EMERGENCY WHERE GRANDCHILDREN MAY GET A HOLD OF PATIENTS PAIN MEDICATIONS. 11/14/14   [provider]  gabapentin (NEURONTIN) 300 MG capsule Take 300 mg by mouth 3 (three) times daily. 03/15/16   [provider]  HYDROmorphone (DILAUDID) 2 MG tablet Take 1-3 tablets (2-6 mg  total) by mouth every 4 (four) hours as needed for moderate pain or severe pain. 12/05/15   Grier Mitts, PA-C  ibuprofen (ADVIL,MOTRIN) 800 MG tablet Take 1 tablet (800 mg total) by mouth 3 (three) times daily. 08/24/16   Law, Bea Graff, PA-C  MethylPREDNISolone Acetate (DEPO-MEDROL IJ) Inject 1 each as directed daily as needed (allergic reaction.).    [provider]  mometasone-formoterol (DULERA) 200-5 MCG/ACT AERO Inhale 2 puffs into the lungs 2 (two) times daily. 06/14/16   Bobbitt, Sedalia Muta, MD  Multiple Vitamin (MULTIVITAMIN WITH MINERALS) TABS tablet Take 1 tablet by mouth at bedtime.    [provider]  OnabotulinumtoxinA (BOTOX IJ) Inject as directed every 3 (three) months. For migraines    [provider]     Family History  Problem Relation Age of Onset  . Anesthesia problems Mother   . Cancer Mother        ovarian  . Leukemia Mother   . Cancer Daughter        cervical  . Cancer Maternal Aunt        breast    Social History   Social History  . Marital status: Married    Spouse name: N/A  . Number of children: N/A  .  Years of education: N/A   Social History Main Topics  . Smoking status: Former Smoker    Quit date: 02/22/2006  . Smokeless tobacco: Never Used  . Alcohol use 0.0 oz/week     Comment: rarely  . Drug use: No  . Sexual activity: Not Asked   Other Topics Concern  . None   Social History Narrative   Lives with husband.        Review of Systems currently denies fever, chest pain, dyspnea, cough, abdominal pain, nausea vomiting; does have occasional headaches, back pain, recent hematuria secondary to kidney  Stones; LE nerve pain  Vital Signs: BP 114/85   Pulse 100   Temp (!) 97.5 F (36.4 C) (Oral)   Resp 16   Ht 5' 3.5" (1.613 m)   Wt 135 lb (61.2 kg)   SpO2 99%   BMI 23.54 kg/m   Physical Exam awake, alert. Chest clear to auscultation bilaterally. Heart with regular rate and rhythm. Abdomen soft, positive bowel sounds, nontender. No significant lower extremity edema.  Imaging: No results found.  Labs:  CBC:  Recent Labs  12/05/15 0641 12/18/15 1535 03/29/16 1224 11/04/16 1312  WBC 12.8* 6.0 7.5 5.9  HGB 11.4* 11.4* 13.1 13.6  HCT 33.5* 34.5* 38.7 39.2  PLT 247 395 315 307    COAGS:  Recent Labs  12/04/15 0619  INR 1.08  APTT 29    BMP:  Recent Labs  12/04/15 0619 12/05/15 0641 12/18/15 1535 03/29/16 1224  NA 138 138 142 141  K 3.7 3.8 3.5 3.4*  CL 106 108 111 111  CO2 25 19* 25 22  GLUCOSE 92 112* 88 86  BUN 13 12 15 20   CALCIUM 8.9 8.2* 8.8* 9.2  CREATININE 0.87 0.97 0.81 0.86  GFRNONAA >60 >60 >60 >60  GFRAA >60 >60 >60 >60    LIVER FUNCTION TESTS:  Recent Labs  12/04/15 0619  BILITOT 0.8  AST 18  ALT 13*  ALKPHOS 56  PROT 6.4*  ALBUMIN 3.7    TUMOR MARKERS: No results for input(s): AFPTM, CEA, CA199, CHROMGRNA in the last 8760 hours.  Assessment and Plan: 51 y.o. female with history of chronic pain syndrome as well as osteonecrosis  and poor venous access. Request received from primary care team for Port-A-Cath  placement for multiple blood draws, medications and sedative administration during procedures.Risks and benefits discussed with the patient/spouse including, but not limited to bleeding, infection, pneumothorax, or fibrin sheath development and need for additional procedures.All of the patient's questions were answered, patient is agreeable to proceed.Consent signed and in chart.    Thank you for this interesting consult.  I greatly enjoyed meeting Kari Green and look forward to participating in their care.  A copy of this report was sent to the requesting provider on this date.  Electronically Signed: D. Rowe Robert, PA-C 11/04/2016, 1:41 PM   I spent a total of  25 minutes   in face to face in clinical consultation, greater than 50% of which was counseling/coordinating care for Port-A-Cath placement

## 2016-11-11 DIAGNOSIS — Z1389 Encounter for screening for other disorder: Secondary | ICD-10-CM | POA: Diagnosis not present

## 2016-11-11 DIAGNOSIS — Z6824 Body mass index (BMI) 24.0-24.9, adult: Secondary | ICD-10-CM | POA: Diagnosis not present

## 2016-11-11 DIAGNOSIS — Z01419 Encounter for gynecological examination (general) (routine) without abnormal findings: Secondary | ICD-10-CM | POA: Diagnosis not present

## 2016-11-11 DIAGNOSIS — Z130 Encounter for screening for diseases of the blood and blood-forming organs and certain disorders involving the immune mechanism: Secondary | ICD-10-CM | POA: Diagnosis not present

## 2016-11-11 DIAGNOSIS — Z79890 Hormone replacement therapy (postmenopausal): Secondary | ICD-10-CM | POA: Diagnosis not present

## 2016-11-17 DIAGNOSIS — M25511 Pain in right shoulder: Secondary | ICD-10-CM | POA: Diagnosis not present

## 2016-11-17 DIAGNOSIS — M25512 Pain in left shoulder: Secondary | ICD-10-CM | POA: Diagnosis not present

## 2016-11-18 DIAGNOSIS — M542 Cervicalgia: Secondary | ICD-10-CM | POA: Diagnosis not present

## 2016-11-18 DIAGNOSIS — G43109 Migraine with aura, not intractable, without status migrainosus: Secondary | ICD-10-CM | POA: Diagnosis not present

## 2016-11-18 DIAGNOSIS — L299 Pruritus, unspecified: Secondary | ICD-10-CM | POA: Diagnosis not present

## 2016-11-18 DIAGNOSIS — G894 Chronic pain syndrome: Secondary | ICD-10-CM | POA: Diagnosis not present

## 2016-11-18 DIAGNOSIS — M545 Low back pain: Secondary | ICD-10-CM | POA: Diagnosis not present

## 2016-11-18 DIAGNOSIS — E538 Deficiency of other specified B group vitamins: Secondary | ICD-10-CM | POA: Diagnosis not present

## 2016-11-19 ENCOUNTER — Telehealth: Payer: Self-pay

## 2016-11-19 ENCOUNTER — Encounter (HOSPITAL_BASED_OUTPATIENT_CLINIC_OR_DEPARTMENT_OTHER): Payer: Self-pay | Admitting: *Deleted

## 2016-11-19 ENCOUNTER — Emergency Department (HOSPITAL_BASED_OUTPATIENT_CLINIC_OR_DEPARTMENT_OTHER)
Admission: EM | Admit: 2016-11-19 | Discharge: 2016-11-19 | Disposition: A | Discharge status: Home / Self Care | Payer: BLUE CROSS/BLUE SHIELD | Attending: Emergency Medicine | Admitting: Emergency Medicine

## 2016-11-19 DIAGNOSIS — Z96651 Presence of right artificial knee joint: Secondary | ICD-10-CM | POA: Insufficient documentation

## 2016-11-19 DIAGNOSIS — Z91010 Allergy to peanuts: Secondary | ICD-10-CM | POA: Insufficient documentation

## 2016-11-19 DIAGNOSIS — L299 Pruritus, unspecified: Secondary | ICD-10-CM | POA: Insufficient documentation

## 2016-11-19 DIAGNOSIS — Z79899 Other long term (current) drug therapy: Secondary | ICD-10-CM | POA: Insufficient documentation

## 2016-11-19 DIAGNOSIS — J45909 Unspecified asthma, uncomplicated: Secondary | ICD-10-CM | POA: Insufficient documentation

## 2016-11-19 DIAGNOSIS — L509 Urticaria, unspecified: Secondary | ICD-10-CM | POA: Diagnosis not present

## 2016-11-19 DIAGNOSIS — E039 Hypothyroidism, unspecified: Secondary | ICD-10-CM | POA: Insufficient documentation

## 2016-11-19 DIAGNOSIS — Z85828 Personal history of other malignant neoplasm of skin: Secondary | ICD-10-CM | POA: Diagnosis not present

## 2016-11-19 DIAGNOSIS — Z91040 Latex allergy status: Secondary | ICD-10-CM | POA: Diagnosis not present

## 2016-11-19 MED ORDER — FAMOTIDINE 20 MG PO TABS
40.0000 mg | ORAL_TABLET | Freq: Once | ORAL | Status: AC
  Administered 2016-11-19: 40 mg via ORAL
  Filled 2016-11-19: qty 2

## 2016-11-19 NOTE — ED Provider Notes (Signed)
[place] DEPT [facility] Provider Note   CSN: [id number] Arrival date & time: 11/19/16  0011     History   Chief Complaint Chief Complaint  Patient presents with  . Urticaria    HPI Kari Green is a 51 y.o. female.  The history is provided by the patient and the spouse.  Illness  This is a chronic problem. The current episode started more than 1 week ago. The problem occurs constantly. The problem has not changed since onset.Pertinent negatives include no chest pain, no abdominal pain, no headaches and no shortness of breath. Nothing aggravates the symptoms. Nothing relieves the symptoms. Treatments tried: dilaudid 180 tabs filled 8/27 atarax and benadryl. The treatment provided no relief.  States she has ongoing issues with itching made worse by taking her husband to [facility] for treatment.  States she is allergic to steroids but was given a dose of depomedrol at her doctors office at 2 pm.  Patient states 3 hours ago she took an atarax and 2 hours ago she took benadryl.    Past Medical History:  Diagnosis Date  . Acute Pain following procedure (Lumbar Sympathetic Radiofrequency Ablation) 05/06/2015  . Anxiety   . Arthritis    right thumb  . Asthma   . Cancer [facility])    history of skin cancer  . Chronic, continuous use of opioids   . Cognitive dysfunction    patient denies  . Complication of anesthesia    "not asleep and aware of everything"  . Degenerative disc disease, cervical   . Degenerative disc disease, lumbar   . Delayed gastric emptying   . Dental crowns present   . Difficult intravenous access   . Difficulty swallowing pills   . Esophageal dysmotility   . Family history of adverse reaction to anesthesia    family - itching, hives, PONV  . Fibromyalgia   . Hemangioma of liver   . Hemangioma, renal   . History of bronchitis   . History of hiatal hernia   . History of kidney stones 04/2014  . History of pneumonia   . Hypothyroidism (acquired)   . IC  (interstitial cystitis)   . Irritable bowel syndrome (IBS)   . Kidney stone   . Knee pain, right    states gets "nerves burned" periodically  . Limited joint range of motion    right knee - unable to fully extend right leg  . Memory impairment    due to pain   . Migraines   . Osteoporosis   . Pain management   . Pain syndrome, chronic   . Pneumonia 2016  . PONV (postoperative nausea and vomiting)    uncontrollable itching with anesthesia; hx. of anesthesia awareness; pt needs 50  mg of Benadryl  . PONV (postoperative nausea and vomiting)    "need medication before and need medication afterwards"  . Sciatica    bilateral  . Seasonal allergies     Patient Active Problem List   Diagnosis Date Noted  . Cancer ([place])   . Intractable chronic migraine without aura and with status migrainosus 07/25/2016  . Venereal wart 07/21/2016  . Vaginal mass 07/21/2016  . Chronic knee pain after total replacement of knee joint (Right) 07/21/2016  . Nasal ulcer 07/05/2016  . S/P shoulder hemiarthroplasty, left 12/04/2015  . Hemoptysis 10/16/2015  . Cough 10/16/2015  . Radiculopathy 07/16/2015  . Cervical lymphadenitis 05/29/2015  . Muscle cramps at night 05/06/2015  . Acute postoperative pain 05/06/2015  . Temporomandibular  joint disorder 04/10/2015  . Buzzing in ear 04/10/2015  . Ear pain, referred 04/10/2015  . OP (osteoporosis) 04/10/2015  . 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/10/2015  . History of migraine headaches 04/10/2015  . Difficulty hearing 04/10/2015  . H/O respiratory system disease 04/10/2015  . Fibromyalgia 04/10/2015  . Deflected nasal septum 04/10/2015  . Chronic otitis externa 04/10/2015  . Abnormal auditory perception 04/10/2015  . Hearing loss 04/10/2015  . History of allergy to IVP dye 01/31/2015  . Chronic low back pain 01/13/2015  . CRPS (complex regional pain syndrome) type I of lower limb (Right-sided) 01/13/2015  . History of arthroplasty of knee (Right) 01/13/2015    . Chronic pain syndrome 01/13/2015  . Chronic pain 01/13/2015  . Chronic knee pain (Location of Secondary source of pain) (Right) 01/13/2015  . Generalized anxiety disorder 01/13/2015  . Chronic lower extremity pain (Location of Primary Source of Pain) (Right) 01/13/2015  . Multiple drug allergies (codeine, Lidoderm a DC if, sulfa, latex, IVP dye, Keflex, Augmentin) 01/13/2015  . Latex allergy 01/13/2015  . Long term current use of opiate analgesic 01/13/2015  . Long term prescription opiate use 01/13/2015  . Opiate use 01/13/2015  . Opiate dependence ([place]) 01/13/2015  . Moderate persistent asthma 11/15/2014  . Allergic rhinitis 11/15/2014  . Memory deficits 10/24/2013  . Left shoulder pain 03/16/2013  . 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 Hypothyroidism 09/21/2012  . Abdominal adhesions 02/11/2012  . Abdominal pain, epigastric 12/15/2011  . Irritable bowel syndrome with constipation 02/26/2011    Past Surgical History:  Procedure Laterality Date  . ABDOMINAL HYSTERECTOMY    . ANTERIOR CERVICAL DECOMP/DISCECTOMY FUSION  03/10/2009   C5-6, C6-7  . ANTERIOR CERVICAL DECOMP/DISCECTOMY FUSION N/A 07/16/2015   Procedure: ANTERIOR CERVICAL DECOMPRESSION FUSION 4-5 WITH INSTRUMENTATION AND AUTOGRAFT;  Surgeon: Phylliss Bob, MD;  Location: [place];  Service: Orthopedics;  Laterality: N/A;  ANTERIOR CERVICAL DECOMPRESSION FUSION 4-5 WITH INSTRUMENTATION AND ALLOGRAFT  . APPENDECTOMY  ~ 1992  . AUGMENTATION MAMMAPLASTY Bilateral 1996; 2001, 2016  . BREAST SURGERY  1980's   accessory breast tissue exc.  Marland Kitchen CARPAL TUNNEL RELEASE Right 07/26/2001  . CARPAL TUNNEL RELEASE Left   . CARPOMETACARPEL SUSPENSION PLASTY Right 02/10/2015   Procedure: RIGHT THUMB SUSPENSION  ARTHROPLASTY;  Surgeon: Milly Jakob, MD;  Location: [place];  Service: Orthopedics;  Laterality: Right;  ANESTHESIA: PRE- OP BLOCK  . COLONOSCOPY W/ POLYPECTOMY    . CYSTO WITH HYDRODISTENSION  01/29/2011    Procedure: CYSTOSCOPY/HYDRODISTENSION;  Surgeon: Claybon Jabs, MD;  Location[facility];  Service: Urology;  Laterality: N/A;  . CYSTO WITH HYDRODISTENSION N/A 05/08/2014   Procedure: CYSTOSCOPY/HYDRODISTENSION WITH BILATERAL RETROGRADES;  Surgeon: Ardis Hughs, MD;  Location: [facility];  Service: Urology;  Laterality: N/A;  . CYSTO WITH HYDRODISTENSION N/A 03/27/2015   Procedure: CYSTOSCOPY/HYDRODISTENSION OF BLADDER, INSTILLATION OF MARCAINE/PYRIDIUM;  Surgeon: Ardis Hughs, MD;  Location: [facility];  Service: Urology;  Laterality: N/A;  . CYSTO/  BLADDER BX  03/08/2008  . CYSTO/ URETHRAL DILATION/ HYDRODISTENTION/ BLADDER BX  04/10/2003  . CYSTOSCOPY W/ RETROGRADES  01/29/2011   Procedure: CYSTOSCOPY WITH RETROGRADE PYELOGRAM;  Surgeon: Claybon Jabs, MD;  Location: [facility];  Service: Urology;  Laterality: Bilateral;  . CYSTOSCOPY WITH STENT PLACEMENT  2006   URETERAL  . DEBRIDEMENT RIGHT THUMB MP JOINT Right 04/10/2002  . DECOMPRESSION LEFT ULNAR NERVE, ELBOW Left 01/29/2003  . ESOPHAGOGASTRODUODENOSCOPY (EGD) WITH ESOPHAGEAL DILATION  10-26-2000  . EUS N/A 04/23/2016   Procedure: UPPER ENDOSCOPIC ULTRASOUND (EUS) LINEAR;  Surgeon: Carol Ada, MD;  Location: [facility];  Service: Endoscopy;  Laterality: N/A;  . EXCISION HAGLUND'S DEFORMITY WITH ACHILLES TENDON REPAIR Bilateral 2013-2014  . EYE SURGERY Bilateral    x4  . FISSURECTOMY  ~ 1988 x2  . GANGLION CYST EXCISION Right 02/10/2015   Procedure: REMOVAL GANGLION OF WRIST, PARTIAL ULNAR EXCISION;  Surgeon: Milly Jakob, MD;  Location: [place];  Service: Orthopedics;  Laterality: Right;  . HAND SURGERY Right 01-31-15  . HEMORROIDECTOMY    . IR FLUORO GUIDE PORT INSERTION RIGHT  11/04/2016  . IR US GUIDE VASC ACCESS RIGHT  11/04/2016  . KIDNEY STONE SURGERY    . KNEE ARTHROSCOPY Right 08/16/2000  . KNEE ARTHROSCOPY W/ LATERAL RELEASE  Right 11/29/2000   states 13 surgeries on right knee  . KNEE ARTHROSCOPY WITH FULKERSON SLIDE Right 11/29/2000  . LAPAROSCOPIC ASSISTED VAGINAL HYSTERECTOMY  Feb 1999  . LAPAROSCOPIC CHOLECYSTECTOMY  03/04/2002  . LAPAROSCOPIC LYSIS OF ADHESIONS  02/11/2012   Procedure: LAPAROSCOPIC LYSIS OF ADHESIONS;  Surgeon: Cheri Fowler, MD;  Location: [facility];  Service: Gynecology;;  . LAPAROSCOPY  02/11/2012   Procedure: LAPAROSCOPY DIAGNOSTIC;  Surgeon: Cheri Fowler, MD;  Location: [facility];  Service: Gynecology;  Laterality: N/A;  Diagnostic  Operative Laparoscopic   . LAPAROSCOPY ADHESIOLYSIS AND REMOVAL POSSIBLE RIGHT OVARY REMNANT  04/18/2000  . LAPAROSCOPY LYSIS ADHESIONS/  BILATERAL SALPINGOOPHORECTOMY/  [place] OF ENDOMETRIOSIS  1998  . MELANOMA EXCISION Left 2013   "behind knee"   . MELANOMA EXCISION     scalp  . NASAL SINUS SURGERY  2013  . NEGATIVE SLEEP STUDY  2006 approx .  per pt  . REFRACTIVE SURGERY Bilateral ~ 1999; ~ 2013  . RIGHT THUMB FUSION OF MPJ Right 10/03/2002  . RIGHT TOTAL KNEE REVISION ARTHROPLASTY Right 03/14/2007  . SHOULDER HEMI-ARTHROPLASTY Left 12/04/2015   Procedure: SHOULDER HEMI-ARTHROPLASTY;  Surgeon: Tania Ade, MD;  Location: [place];  Service: Orthopedics;  Laterality: Left;  Left shoulder hemiarthroplasty  . SHOULDER SURGERY Left   . TONSILLECTOMY AND ADENOIDECTOMY  ~ 1989  . TOTAL KNEE ARTHROPLASTY Right 09/10/2003; 12/08/2012   x3  . TOTAL THYROIDECTOMY  1993  . TRANSANAL HEMORRHOIDAL DEARTERIALIZATION  03/26/2011  . TRANSTHORACIC ECHOCARDIOGRAM  09/22/2012  . TRIGGER FINGER RELEASE Left 07/06/2016   Procedure: LEFT THUMB TRIGGER RELEASE;  Surgeon: Milly Jakob, MD;  Location: [place];  Service: Orthopedics;  Laterality: Left;  Marland Kitchen VIDEO BRONCHOSCOPY Bilateral 10/20/2015   Procedure: VIDEO BRONCHOSCOPY WITHOUT FLUORO;  Surgeon: Collene Gobble, MD;  Location: [place];  Service: Cardiopulmonary;  Laterality: Bilateral;    OB  History    Gravida Para Term Preterm AB Living   0 0 0 0 0     SAB TAB Ectopic Multiple Live Births   0 0 0           Home Medications    Prior to Admission medications   Medication Sig Start Date End Date Taking? Authorizing Provider  albuterol (PROAIR HFA) 108 (90 Base) MCG/ACT inhaler Inhale 2 puffs into the lungs every 4 (four) hours as needed for wheezing or shortness of breath. 06/14/16   Bobbitt, Sed[place] Muta, MD  albuterol (PROVENTIL) (2.5 MG/3ML) 0.083% nebulizer solution Take 3 mLs (2.5 mg total) by nebulization every 4 (four) hours as needed for wheezing (or coughing spells). 06/14/16   Bobbitt, Sed[place] Muta, MD  alclomethasone (ACLOVATE) 0.05 %  cream alclometasone 0.05 % topical cream  APPLY 1 APPLICATION APPLY ON THE SKIN DAILY APPLY SMALL AMOUNT TO AFFECTED AREAS DAILY    [provider]  ALPRAZolam (XANAX) 0.5 MG tablet Take 1 mg by mouth 3 (three) times daily as needed. Anxiety    [provider]  Azelastine HCl 0.15 % SOLN Place 1-2 sprays into both nostrils 2 (two) times daily. 06/14/16   Bobbitt, Sedalia Muta, MD  clindamycin (CLEOCIN) 150 MG capsule Take 3 capsules (450 mg total) by mouth 3 (three) times daily. 08/24/16   Law, Bea Graff, PA-C  cyanocobalamin (,VITAMIN B-12,) 1000 MCG/ML injection Inject 1,000 mcg into the muscle once a week. Reported on 03/25/2015    [provider]  diphenhydrAMINE (BENADRYL) 50 MG tablet Take 50 mg by mouth every 4 (four) hours as needed for itching.     [provider]  EPINEPHrine (EPIPEN 2-PAK) 0.3 mg/0.3 mL IJ SOAJ injection Inject 0.3 mLs (0.3 mg total) into the muscle as needed (in the event of a severe life-threatening reaction). 12/09/14   Charlies Silvers, MD  Erenumab-aooe (AIMOVIG) 70 MG/ML SOAJ Inject 70 mg into the skin every 30 (thirty) days.    [provider]  estradiol (ESTRACE) 0.5 MG tablet Take 1 mg by mouth at bedtime.     [provider]  EVZIO 0.4 MG/0.4ML SOAJ  AUTO INJECT AS NEEDED IN CASE OF EMERGENCY WHERE GRANDCHILDREN MAY GET A HOLD OF PATIENTS PAIN MEDICATIONS. 11/14/14   [provider]  fluticasone (FLONASE) 50 MCG/ACT nasal spray INHALE 2 SPRAYS INTO BOTH NOSTRILS DAILY. 07/05/16   Charlies Silvers, MD  gabapentin (NEURONTIN) 300 MG capsule Take 300 mg by mouth 3 (three) times daily. 03/15/16   [provider]  HYDROmorphone (DILAUDID) 2 MG tablet Take 1-3 tablets (2-6 mg total) by mouth every 4 (four) hours as needed for moderate pain or severe pain. 12/05/15   Grier Mitts, PA-C  ibuprofen (ADVIL,MOTRIN) 800 MG tablet Take 1 tablet (800 mg total) by mouth 3 (three) times daily. 08/24/16   Law, Bea Graff, PA-C  ketoconazole (NIZORAL) 2 % shampoo One application once a month as needed for flaking 11/19/14   [provider]  levothyroxine (SYNTHROID, LEVOTHROID) 50 MCG tablet Take 200 mcg by mouth daily. Pt can only take name brand    [provider]  meloxicam (MOBIC) 15 MG tablet Take 15 mg by mouth daily.    [provider]  methocarbamol (ROBAXIN) 500 MG tablet Take 1 tablet (500 mg total) by mouth 2 (two) times daily. 08/24/16   Law, Bea Graff, PA-C  MethylPREDNISolone Acetate (DEPO-MEDROL IJ) Inject 1 each as directed daily as needed (allergic reaction.).    [provider]  mometasone-formoterol (DULERA) 200-5 MCG/ACT AERO Inhale 2 puffs into the lungs 2 (two) times daily. 06/14/16   Bobbitt, [place] Muta, MD  Multiple Vitamin (MULTIVITAMIN WITH MINERALS) TABS tablet Take 1 tablet by mouth at bedtime.    [provider]  OnabotulinumtoxinA (BOTOX IJ) Inject as directed every 3 (three) months. For migraines    [provider]  promethazine (PHENERGAN) 25 MG tablet Take 1-2 tablets (25-50 mg total) by mouth 2 (two) times daily as needed for nausea or vomiting. 08/24/16   Law, Bea Graff, PA-C  propranolol (INDERAL) 20 MG tablet Take 40 mg by mouth daily.  05/12/15   [provider]  topiramate (TOPAMAX) 25 MG tablet Take 200 mg by mouth daily.  04/15/15   [provider]  vitamin C (ASCORBIC ACID) 500 MG tablet Take 1,000 mg by mouth daily.    [provider]    Family History Family History  Problem Relation Age of Onset  . Anesthesia problems Mother   . Cancer Mother        ovarian  . Leukemia Mother   . Cancer Daughter        cervical  . Cancer Maternal Aunt        breast    Social History Social History  Substance Use Topics  . Smoking status: Former Smoker    Quit date: 02/22/2006  . Smokeless tobacco: Never Used  . Alcohol use 0.0 oz/week     Comment: rarely     Allergies   [place]; Latex; Nickel; Peanuts [peanut oil]; Red dye; Azithromycin; Chloraprep one step [chlorhexidine gluconate]; Corticosteroids; Flexeril [cyclobenzaprine]; Morphine and related; Other; Percocet [oxycodone-acetaminophen]; Supartz [sodium hyaluronate]; Surgical lubricant; Yellow dyes (non-tartrazine); Cephalexin; Monistat [miconazole]; Prednisone; Sulfa antibiotics; Yellow dye; Soap; and Tape   Review of Systems Review of Systems  HENT: Negative for congestion, drooling, facial swelling, trouble swallowing and voice change.   Respiratory: Negative for chest tightness, shortness of breath, wheezing and stridor.   Cardiovascular: Negative for chest pain, palpitations and leg swelling.  Gastrointestinal: Negative for abdominal pain and vomiting.  Skin: Negative for color change and rash.  Neurological: Negative for headaches.  All other systems reviewed and are negative.    Physical Exam Updated Vital Signs BP 124/87 (BP Location: Left Arm)   Pulse 98   Temp 98.2 F (36.8 C) (Oral)   Resp 18   SpO2 100%   Physical Exam  Constitutional: She is oriented to person, place, and time. She appears well-developed and well-nourished. No distress.  HENT:  Head: Normocephalic and atraumatic.  Nose: Nose normal.  Mouth/Throat: No oropharyngeal  exudate.  No swelling of the lips tongue or uvula  Eyes: Conjunctivae and EOM are normal.  pinpoint  Neck: Normal range of motion. Neck supple.  Intact phonation  Cardiovascular: Normal rate, regular rhythm, normal heart sounds and intact distal pulses.   Pulmonary/Chest: Effort normal and breath sounds normal. No stridor. No respiratory distress. She has no wheezes. She has no rales.  Abdominal: Soft. Bowel sounds are normal. She exhibits no mass. There is no tenderness. There is no rebound and no guarding.  Musculoskeletal: Normal range of motion.  Lymphadenopathy:    She has no cervical adenopathy.  Neurological: She is alert and oriented to person, place, and time. She displays normal reflexes.  Skin: Skin is warm and dry. Capillary refill takes less than 2 seconds. No rash noted.  Psychiatric: She has a normal mood and affect.     ED Treatments / Results   Vitals:   11/19/16 0019  BP: 124/87  Pulse: 98  Resp: 18  Temp: 98.2 F (36.8 C)  SpO2: 100%    Procedures Procedures (including critical care time)  Medications Ordered in ED Medications  famotidine (PEPCID) tablet 40 mg (40 mg Oral Given 11/19/16 0046)     Patient then changed her statement stating she was given the shot at 11 am.  She would also like her port accessed and she would like pain medication for ongoing chronic pain.  EDP states this was not indicated.    Final Clinical Impressions(s) / ED Diagnoses   Drug seeking behavior:  I will not be accessing the patient's port as there are no lesions on the skin or in the mouth.  Patient is allergic to steroids and is on hydroxyzine and benadryl already. Moreover, her physician is one whose records we can seen in care everywhere but there is no note from a visit today.  The patient's nurse was present during the entire visit.  EDP stated she is not due for more benadryl or atarax and given she cannot remember the time she was given a medication she has a listed  allergy to I will not be giving this without skin lesions or swelling of the lips tongue or uvula.    Strict return precautions given for  Shortness of breath, swelling or the lips or tongue, chest pain, dyspnea on exertion, new weakness or numbness changes in vision or speech,  Inability to tolerate liquids or food, changes in voice cough, altered mental status or any concerns. No signs of systemic illness or infection. The patient is nontoxic-appearing on exam and vital signs are within normal limits.    I have reviewed the triage vital signs and the nursing notes. Pertinent labs &imaging results that were available during my care of the patient were reviewed by me and considered in my medical decision making (see chart for details).  After history, exam, and medical workup I feel the patient has been appropriately medically screened and is safe for discharge home. Pertinent diagnoses were discussed with the patient. Patient was given return precautions.      New Prescriptions New Prescriptions   No medications on file     Argel Pablo, MD 11/19/16 [phone]

## 2016-11-19 NOTE — Telephone Encounter (Signed)
Dr. Verlin Fester patient called into the office and stated that yall discuss in her last visit restarting her allergy injection. She stated that she is ready to restart her injection and she has and appointment to start injections on 12/10/16. Please write the prescription. Thanks

## 2016-11-19 NOTE — ED Notes (Signed)
Patient was falling asleep while EMT was getting vital signs.

## 2016-11-19 NOTE — ED Triage Notes (Signed)
Pt with with generalized itching x 2 days denies [facility] difficulty swallowing or hives

## 2016-11-19 NOTE — ED Triage Notes (Deleted)
Pt with itching x 2 days

## 2016-11-22 NOTE — Telephone Encounter (Signed)
ITx prescription printed placed on Dr [employer] desk

## 2016-11-22 NOTE — Telephone Encounter (Signed)
Did not leave message answer machine had different name on it. We need to advise patient if she would like to get retested or use previous ITx script per Dr Verlin Fester. Also Dr Shaune Leeks would need to be the one to write script since he was original prescriber if patient wants previous script per Dr Verlin Fester.

## 2016-11-22 NOTE — Telephone Encounter (Signed)
Please put a copy of the original ITx prescription on my desk and I will put the order in. Thanks.

## 2016-11-22 NOTE — Telephone Encounter (Signed)
If she wants to be retested, unless she has a problem going to [facility], it might be a good idea for Dr. Shaune Leeks to retest her. If she wants to be tested in [place], I will test her. Thanks.

## 2016-11-23 NOTE — Telephone Encounter (Signed)
Number on file is not peennys, but lm on husbands voice mail to have Nimo call us.

## 2016-11-24 NOTE — Telephone Encounter (Signed)
I spoke with patient and informed her of this information. She is not feeling well at this time so she will call back and schedule a testing appointment.

## 2016-12-01 ENCOUNTER — Other Ambulatory Visit: Payer: Self-pay

## 2016-12-01 MED ORDER — ERENUMAB-AOOE 70 MG/ML ~~LOC~~ SOAJ: 70.0000 mg | SUBCUTANEOUS | 11 refills | Status: DC

## 2016-12-10 ENCOUNTER — Encounter: Payer: Self-pay | Admitting: Pain Medicine

## 2016-12-10 ENCOUNTER — Ambulatory Visit: Payer: BLUE CROSS/BLUE SHIELD

## 2016-12-15 ENCOUNTER — Other Ambulatory Visit: Payer: Self-pay | Admitting: Pediatrics

## 2016-12-17 DIAGNOSIS — G894 Chronic pain syndrome: Secondary | ICD-10-CM | POA: Diagnosis not present

## 2016-12-17 DIAGNOSIS — M545 Low back pain: Secondary | ICD-10-CM | POA: Diagnosis not present

## 2016-12-17 DIAGNOSIS — M5412 Radiculopathy, cervical region: Secondary | ICD-10-CM | POA: Diagnosis not present

## 2016-12-17 DIAGNOSIS — G43109 Migraine with aura, not intractable, without status migrainosus: Secondary | ICD-10-CM | POA: Diagnosis not present

## 2016-12-23 ENCOUNTER — Encounter (HOSPITAL_BASED_OUTPATIENT_CLINIC_OR_DEPARTMENT_OTHER): Payer: Self-pay | Admitting: *Deleted

## 2016-12-23 ENCOUNTER — Emergency Department (HOSPITAL_BASED_OUTPATIENT_CLINIC_OR_DEPARTMENT_OTHER)
Admission: EM | Admit: 2016-12-23 | Discharge: 2016-12-23 | Disposition: A | Discharge status: Home / Self Care | Payer: BLUE CROSS/BLUE SHIELD | Attending: Emergency Medicine | Admitting: Emergency Medicine

## 2016-12-23 DIAGNOSIS — E039 Hypothyroidism, unspecified: Secondary | ICD-10-CM | POA: Insufficient documentation

## 2016-12-23 DIAGNOSIS — Z85828 Personal history of other malignant neoplasm of skin: Secondary | ICD-10-CM | POA: Diagnosis not present

## 2016-12-23 DIAGNOSIS — X19XXXA Contact with other heat and hot substances, initial encounter: Secondary | ICD-10-CM | POA: Diagnosis not present

## 2016-12-23 DIAGNOSIS — J45909 Unspecified asthma, uncomplicated: Secondary | ICD-10-CM | POA: Diagnosis not present

## 2016-12-23 DIAGNOSIS — Z96651 Presence of right artificial knee joint: Secondary | ICD-10-CM | POA: Insufficient documentation

## 2016-12-23 DIAGNOSIS — Y9389 Activity, other specified: Secondary | ICD-10-CM | POA: Diagnosis not present

## 2016-12-23 DIAGNOSIS — S60322A Blister (nonthermal) of left thumb, initial encounter: Secondary | ICD-10-CM | POA: Diagnosis not present

## 2016-12-23 DIAGNOSIS — Z9882 Breast implant status: Secondary | ICD-10-CM | POA: Diagnosis not present

## 2016-12-23 DIAGNOSIS — Z87891 Personal history of nicotine dependence: Secondary | ICD-10-CM | POA: Insufficient documentation

## 2016-12-23 DIAGNOSIS — Y998 Other external cause status: Secondary | ICD-10-CM | POA: Insufficient documentation

## 2016-12-23 DIAGNOSIS — Y929 Unspecified place or not applicable: Secondary | ICD-10-CM | POA: Diagnosis not present

## 2016-12-23 DIAGNOSIS — Z96612 Presence of left artificial shoulder joint: Secondary | ICD-10-CM | POA: Diagnosis not present

## 2016-12-23 DIAGNOSIS — T23102A Burn of first degree of left hand, unspecified site, initial encounter: Secondary | ICD-10-CM | POA: Diagnosis not present

## 2016-12-23 DIAGNOSIS — T23061A Burn of unspecified degree of back of right hand, initial encounter: Secondary | ICD-10-CM | POA: Diagnosis not present

## 2016-12-23 DIAGNOSIS — Z79899 Other long term (current) drug therapy: Secondary | ICD-10-CM | POA: Insufficient documentation

## 2016-12-23 DIAGNOSIS — Z91010 Allergy to peanuts: Secondary | ICD-10-CM | POA: Insufficient documentation

## 2016-12-23 DIAGNOSIS — T23001A Burn of unspecified degree of right hand, unspecified site, initial encounter: Secondary | ICD-10-CM | POA: Diagnosis not present

## 2016-12-23 DIAGNOSIS — Z91040 Latex allergy status: Secondary | ICD-10-CM | POA: Diagnosis not present

## 2016-12-23 MED ORDER — BACITRACIN ZINC 500 UNIT/GM EX OINT: 1.0000 "application " | TOPICAL_OINTMENT | Freq: Two times a day (BID) | CUTANEOUS | 0 refills | Status: DC

## 2016-12-23 MED ORDER — BACITRACIN ZINC 500 UNIT/GM EX OINT
TOPICAL_OINTMENT | Freq: Once | CUTANEOUS | Status: AC
  Administered 2016-12-23: 1 via TOPICAL
  Filled 2016-12-23: qty 28.35

## 2016-12-23 NOTE — Discharge Instructions (Signed)
It was my pleasure taking care of you today!   Keep wound clean and dry. Use topical antibiotic ointment twice daily.   Follow up with the wound care center if symptoms persist. You will need to call to schedule a follow up appointment.   Return to ER for fever, new or worsening symptoms, any additional concerns.
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
## 2016-12-23 NOTE — ED Notes (Signed)
ED Provider at bedside. 

## 2016-12-23 NOTE — ED Provider Notes (Signed)
[facility] Provider Note   CSN: [id number] Arrival date & time: 12/23/16  2233     History   Chief Complaint Chief Complaint  Patient presents with  . Hand Burn    HPI [facility] is a 51 y.o. female.  The history is provided by the patient and medical records. No language interpreter was used.   [facility] is a 51 y.o. female who presents to the Emergency Department complaining of burn to the right dorsum of the hand and blister to the left thumb since yesterday. Patient states that she was using her glue gun to make a centerpiece when the hot glue got onto the back of her right hand. She used her left thumb to remove the hot glue. She applied ice with little relief. She also applied a topical antiseptic spray yesterday after the injury. No numbness or tingling. No alleviating symptoms. Worse with palpation.   Past Medical History:  Diagnosis Date  . Acute Pain following procedure (Lumbar Sympathetic Radiofrequency Ablation) 05/06/2015  . Anxiety   . Arthritis    right thumb  . Asthma   . Cancer [facility])    history of skin cancer  . Chronic, continuous use of opioids   . Cognitive dysfunction    patient denies  . Complication of anesthesia    "not asleep and aware of everything"  . Degenerative disc disease, cervical   . Degenerative disc disease, lumbar   . Delayed gastric emptying   . Dental crowns present   . Difficult intravenous access   . Difficulty swallowing pills   . Esophageal dysmotility   . Family history of adverse reaction to anesthesia    family - itching, hives, PONV  . Fibromyalgia   . Hemangioma of liver   . Hemangioma, renal   . History of bronchitis   . History of hiatal hernia   . History of kidney stones 04/2014  . History of pneumonia   . Hypothyroidism (acquired)   . IC (interstitial cystitis)   . Irritable bowel syndrome (IBS)   . Kidney stone   . Knee pain, right    states gets "nerves  burned" periodically  . Limited joint range of motion    right knee - unable to fully extend right leg  . Memory impairment    due to pain   . Migraines   . Osteoporosis   . Pain management   . Pain syndrome, chronic   . Pneumonia 2016  . PONV (postoperative nausea and vomiting)    uncontrollable itching with anesthesia; hx. of anesthesia awareness; pt needs 50  mg of Benadryl  . PONV (postoperative nausea and vomiting)    "need medication before and need medication afterwards"  . Sciatica    bilateral  . Seasonal allergies     Patient Active Problem List   Diagnosis Date Noted  . Cancer ([place])   . Intractable chronic migraine without aura and with status migrainosus 07/25/2016  . Venereal wart 07/21/2016  . Vaginal mass 07/21/2016  . Chronic knee pain after total replacement of knee joint (Right) 07/21/2016  . Nasal ulcer 07/05/2016  . S/P shoulder hemiarthroplasty, left 12/04/2015  . Hemoptysis 10/16/2015  . Cough 10/16/2015  . Radiculopathy 07/16/2015  . Cervical lymphadenitis 05/29/2015  . Muscle cramps at night 05/06/2015  . Acute postoperative pain 05/06/2015  . Temporomandibular joint disorder 04/10/2015  . Buzzing in ear 04/10/2015  . Ear pain, referred 04/10/2015  . OP (osteoporosis) 04/10/2015  .  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/10/2015  . History of migraine headaches 04/10/2015  . Difficulty hearing 04/10/2015  . H/O respiratory system disease 04/10/2015  . Fibromyalgia 04/10/2015  . Deflected nasal septum 04/10/2015  . Chronic otitis externa 04/10/2015  . Abnormal auditory perception 04/10/2015  . Hearing loss 04/10/2015  . History of allergy to IVP dye 01/31/2015  . Chronic low back pain 01/13/2015  . CRPS (complex regional pain syndrome) type I of lower limb (Right-sided) 01/13/2015  . History of arthroplasty of knee (Right) 01/13/2015  . Chronic pain syndrome 01/13/2015  . Chronic pain 01/13/2015  . Chronic knee pain (Location of Secondary source of  pain) (Right) 01/13/2015  . Generalized anxiety disorder 01/13/2015  . Chronic lower extremity pain (Location of Primary Source of Pain) (Right) 01/13/2015  . Multiple drug allergies (codeine, Lidoderm a DC if, sulfa, latex, IVP dye, Keflex, Augmentin) 01/13/2015  . Latex allergy 01/13/2015  . Long term current use of opiate analgesic 01/13/2015  . Long term prescription opiate use 01/13/2015  . Opiate use 01/13/2015  . Opiate dependence ([place]) 01/13/2015  . Moderate persistent asthma 11/15/2014  . Allergic rhinitis 11/15/2014  . Memory deficits 10/24/2013  . Left shoulder pain 03/16/2013  . 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 Hypothyroidism 09/21/2012  . Abdominal adhesions 02/11/2012  . Abdominal pain, epigastric 12/15/2011  . Irritable bowel syndrome with constipation 02/26/2011    Past Surgical History:  Procedure Laterality Date  . ABDOMINAL HYSTERECTOMY    . ANTERIOR CERVICAL DECOMP/DISCECTOMY FUSION  03/10/2009   C5-6, C6-7  . ANTERIOR CERVICAL DECOMP/DISCECTOMY FUSION N/A 07/16/2015   Procedure: ANTERIOR CERVICAL DECOMPRESSION FUSION 4-5 WITH INSTRUMENTATION AND AUTOGRAFT;  Surgeon: Phylliss Bob, MD;  Location: [place];  Service: Orthopedics;  Laterality: N/A;  ANTERIOR CERVICAL DECOMPRESSION FUSION 4-5 WITH INSTRUMENTATION AND ALLOGRAFT  . APPENDECTOMY  ~ 1992  . AUGMENTATION MAMMAPLASTY Bilateral 1996; 2001, 2016  . BREAST SURGERY  1980's   accessory breast tissue exc.  Marland Kitchen CARPAL TUNNEL RELEASE Right 07/26/2001  . CARPAL TUNNEL RELEASE Left   . CARPOMETACARPEL SUSPENSION PLASTY Right 02/10/2015   Procedure: RIGHT THUMB SUSPENSION  ARTHROPLASTY;  Surgeon: Milly Jakob, MD;  Location: [place];  Service: Orthopedics;  Laterality: Right;  ANESTHESIA: PRE- OP BLOCK  . COLONOSCOPY W/ POLYPECTOMY    . CYSTO WITH HYDRODISTENSION  01/29/2011   Procedure: CYSTOSCOPY/HYDRODISTENSION;  Surgeon: Claybon Jabs, MD;  Location: [facility];  Service:  Urology;  Laterality: N/A;  . CYSTO WITH HYDRODISTENSION N/A 05/08/2014   Procedure: CYSTOSCOPY/HYDRODISTENSION WITH BILATERAL RETROGRADES;  Surgeon: Ardis Hughs, MD;  Location: [facility];  Service: Urology;  Laterality: N/A;  . CYSTO WITH HYDRODISTENSION N/A 03/27/2015   Procedure: CYSTOSCOPY/HYDRODISTENSION OF BLADDER, INSTILLATION OF MARCAINE/PYRIDIUM;  Surgeon: Ardis Hughs, MD;  Location[facility];  Service: Urology;  Laterality: N/A;  . CYSTO/  BLADDER BX  03/08/2008  . CYSTO/ URETHRAL DILATION/ HYDRODISTENTION/ BLADDER BX  04/10/2003  . CYSTOSCOPY W/ RETROGRADES  01/29/2011   Procedure: CYSTOSCOPY WITH RETROGRADE PYELOGRAM;  Surgeon: Claybon Jabs, MD;  Location: [facility];  Service: Urology;  Laterality: Bilateral;  . CYSTOSCOPY WITH STENT PLACEMENT  2006   URETERAL  . DEBRIDEMENT RIGHT THUMB MP JOINT Right 04/10/2002  . DECOMPRESSION LEFT ULNAR NERVE, ELBOW Left 01/29/2003  . ESOPHAGOGASTRODUODENOSCOPY (EGD) WITH ESOPHAGEAL DILATION  10-26-2000  . EUS N/A 04/23/2016   Procedure: UPPER ENDOSCOPIC ULTRASOUND (EUS) LINEAR;  Surgeon: Carol Ada, MD;  [facility];  Service: Endoscopy;  Laterality: N/A;  . EXCISION HAGLUND'S DEFORMITY WITH ACHILLES TENDON REPAIR Bilateral 2013-2014  . EYE SURGERY Bilateral    x4  . FISSURECTOMY  ~ 1988 x2  . GANGLION CYST EXCISION Right 02/10/2015   Procedure: REMOVAL GANGLION OF WRIST, PARTIAL ULNAR EXCISION;  Surgeon: Milly Jakob, MD;  Location: [place];  Service: Orthopedics;  Laterality: Right;  . HAND SURGERY Right 01-31-15  . HEMORROIDECTOMY    . IR FLUORO GUIDE PORT INSERTION RIGHT  11/04/2016  . IR US GUIDE VASC ACCESS RIGHT  11/04/2016  . KIDNEY STONE SURGERY    . KNEE ARTHROSCOPY Right 08/16/2000  . KNEE ARTHROSCOPY W/ LATERAL RELEASE Right 11/29/2000   states 13 surgeries on right knee  . KNEE ARTHROSCOPY WITH FULKERSON SLIDE Right 11/29/2000  .  LAPAROSCOPIC ASSISTED VAGINAL HYSTERECTOMY  Feb 1999  . LAPAROSCOPIC CHOLECYSTECTOMY  03/04/2002  . LAPAROSCOPIC LYSIS OF ADHESIONS  02/11/2012   Procedure: LAPAROSCOPIC LYSIS OF ADHESIONS;  Surgeon: Cheri Fowler, MD;  Location: [facility];  Service: Gynecology;;  . LAPAROSCOPY  02/11/2012   Procedure: LAPAROSCOPY DIAGNOSTIC;  Surgeon: Cheri Fowler, MD;  Location: [facility];  Service: Gynecology;  Laterality: N/A;  Diagnostic  Operative Laparoscopic   . LAPAROSCOPY ADHESIOLYSIS AND REMOVAL POSSIBLE RIGHT OVARY REMNANT  04/18/2000  . LAPAROSCOPY LYSIS ADHESIONS/  BILATERAL SALPINGOOPHORECTOMY/  [place] OF ENDOMETRIOSIS  1998  . MELANOMA EXCISION Left 2013   "behind knee"   . MELANOMA EXCISION     scalp  . NASAL SINUS SURGERY  2013  . NEGATIVE SLEEP STUDY  2006 approx .  per pt  . REFRACTIVE SURGERY Bilateral ~ 1999; ~ 2013  . RIGHT THUMB FUSION OF MPJ Right 10/03/2002  . RIGHT TOTAL KNEE REVISION ARTHROPLASTY Right 03/14/2007  . SHOULDER HEMI-ARTHROPLASTY Left 12/04/2015   Procedure: SHOULDER HEMI-ARTHROPLASTY;  Surgeon: Tania Ade, MD;  Location: [place];  Service: Orthopedics;  Laterality: Left;  Left shoulder hemiarthroplasty  . SHOULDER SURGERY Left   . TONSILLECTOMY AND ADENOIDECTOMY  ~ 1989  . TOTAL KNEE ARTHROPLASTY Right 09/10/2003; 12/08/2012   x3  . TOTAL THYROIDECTOMY  1993  . TRANSANAL HEMORRHOIDAL DEARTERIALIZATION  03/26/2011  . TRANSTHORACIC ECHOCARDIOGRAM  09/22/2012  . TRIGGER FINGER RELEASE Left 07/06/2016   Procedure: LEFT THUMB TRIGGER RELEASE;  Surgeon: Milly Jakob, MD;  Location: [place];  Service: Orthopedics;  Laterality: Left;  Marland Kitchen VIDEO BRONCHOSCOPY Bilateral 10/20/2015   Procedure: VIDEO BRONCHOSCOPY WITHOUT FLUORO;  Surgeon: Collene Gobble, MD;  Location: [place];  Service: Cardiopulmonary;  Laterality: Bilateral;    OB History    Gravida Para Term Preterm AB Living   0 0 0 0 0     SAB TAB Ectopic Multiple Live Births   0 0 0            Home Medications    Prior to Admission medications   Medication Sig Start Date End Date Taking? Authorizing Provider  albuterol (PROAIR HFA) 108 (90 Base) MCG/ACT inhaler Inhale 2 puffs into the lungs every 4 (four) hours as needed for wheezing or shortness of breath. 06/14/16   Bobbitt, Sedalia Muta, MD  albuterol (PROVENTIL) (2.5 MG/3ML) 0.083% nebulizer solution Take 3 mLs (2.5 mg total) by nebulization every 4 (four) hours as needed for wheezing (or coughing spells). 06/14/16   Bobbitt, Sedalia Muta, MD  alclomethasone (ACLOVATE) 0.05 % cream alclometasone 0.05 % topical cream  APPLY 1 APPLICATION APPLY ON THE SKIN DAILY APPLY SMALL AMOUNT TO AFFECTED AREAS DAILY  [provider]  ALPRAZolam Duanne Moron) 0.5 MG tablet Take 1 mg by mouth 3 (three) times daily as needed. Anxiety    [provider]  Azelastine HCl 0.15 % SOLN Place 1-2 sprays into both nostrils 2 (two) times daily. 06/14/16   Bobbitt, Sedalia Muta, MD  bacitracin ointment Apply 1 application topically 2 (two) times daily. 12/23/16   Ekaterini Capitano, Ozella Almond, PA-C  clindamycin (CLEOCIN) 150 MG capsule Take 3 capsules (450 mg total) by mouth 3 (three) times daily. 08/24/16   Law, Bea Graff, PA-C  cyanocobalamin (,VITAMIN B-12,) 1000 MCG/ML injection Inject 1,000 mcg into the muscle once a week. Reported on 03/25/2015    [provider]  diphenhydrAMINE (BENADRYL) 50 MG tablet Take 50 mg by mouth every 4 (four) hours as needed for itching.     [provider]  EPINEPHrine (EPIPEN 2-PAK) 0.3 mg/0.3 mL IJ SOAJ injection Inject 0.3 mLs (0.3 mg total) into the muscle as needed (in the event of a severe life-threatening reaction). 12/09/14   Charlies Silvers, MD  Erenumab-aooe (AIMOVIG) 70 MG/ML SOAJ Inject 70 mg into the skin every 30 (thirty) days. 12/01/16   Melvenia Beam, MD  estradiol (ESTRACE) 0.5 MG tablet Take 1 mg by mouth at bedtime.     [provider]  EVZIO 0.4 MG/0.4ML SOAJ AUTO  INJECT AS NEEDED IN CASE OF EMERGENCY WHERE GRANDCHILDREN MAY GET A HOLD OF PATIENTS PAIN MEDICATIONS. 11/14/14   [provider]  fluticasone (FLONASE) 50 MCG/ACT nasal spray SPRAY 2 SPRAYS INTO EACH NOSTRIL DAILY 12/15/16   Bobbitt, Sedalia Muta, MD  gabapentin (NEURONTIN) 300 MG capsule Take 300 mg by mouth 3 (three) times daily. 03/15/16   [provider]  HYDROmorphone (DILAUDID) 2 MG tablet Take 1-3 tablets (2-6 mg total) by mouth every 4 (four) hours as needed for moderate pain or severe pain. 12/05/15   Grier Mitts, PA-C  ibuprofen (ADVIL,MOTRIN) 800 MG tablet Take 1 tablet (800 mg total) by mouth 3 (three) times daily. 08/24/16   Law, Bea Graff, PA-C  ketoconazole (NIZORAL) 2 % shampoo One application once a month as needed for flaking 11/19/14   [provider]  levothyroxine (SYNTHROID, LEVOTHROID) 50 MCG tablet Take 200 mcg by mouth daily. Pt can only take name brand    [provider]  meloxicam (MOBIC) 15 MG tablet Take 15 mg by mouth daily.    [provider]  methocarbamol (ROBAXIN) 500 MG tablet Take 1 tablet (500 mg total) by mouth 2 (two) times daily. 08/24/16   Law, Bea Graff, PA-C  MethylPREDNISolone Acetate (DEPO-MEDROL IJ) Inject 1 each as directed daily as needed (allergic reaction.).    [provider]  mometasone-formoterol (DULERA) 200-5 MCG/ACT AERO Inhale 2 puffs into the lungs 2 (two) times daily. 06/14/16   Bobbitt, Sedalia Muta, MD  Multiple Vitamin (MULTIVITAMIN WITH MINERALS) TABS tablet Take 1 tablet by mouth at bedtime.    [provider]  OnabotulinumtoxinA (BOTOX IJ) Inject as directed every 3 (three) months. For migraines    [provider]  promethazine (PHENERGAN) 25 MG tablet Take 1-2 tablets (25-50 mg total) by mouth 2 (two) times daily as needed for nausea or vomiting. 08/24/16   Law, Bea Graff, PA-C  propranolol (INDERAL) 20 MG tablet Take 40 mg by mouth daily.  05/12/15   [provider]  topiramate (TOPAMAX) 25 MG tablet Take 200 mg by mouth daily.  04/15/15   [provider]  vitamin C (ASCORBIC ACID)  500 MG tablet Take 1,000 mg by mouth daily.    [provider]    Family History Family History  Problem Relation Age of Onset  . Anesthesia problems Mother   . Cancer Mother        ovarian  . Leukemia Mother   . Cancer Daughter        cervical  . Cancer Maternal Aunt        breast    Social History Social History  Substance Use Topics  . Smoking status: Former Smoker    Quit date: 02/22/2006  . Smokeless tobacco: Never Used  . Alcohol use 0.0 oz/week     Comment: rarely     Allergies   [place]; Latex; Nickel; Peanuts [peanut oil]; Red dye; Azithromycin; Chloraprep one step [chlorhexidine gluconate]; Corticosteroids; Flexeril [cyclobenzaprine]; Morphine and related; Other; Percocet [oxycodone-acetaminophen]; Supartz [sodium hyaluronate]; Surgical lubricant; Yellow dyes (non-tartrazine); Cephalexin; Monistat [miconazole]; Prednisone; Sulfa antibiotics; Yellow dye; Soap; and Tape   Review of Systems Review of Systems  Constitutional: Negative for chills and fever.  Musculoskeletal: Positive for myalgias. Negative for arthralgias.  Skin: Positive for wound.  Neurological: Negative for weakness and numbness.     Physical Exam Updated Vital Signs BP (!) 123/92   Pulse 97   Temp 98.1 F (36.7 C) (Oral)   Resp 20   Ht 5\' 3"  (1.6 m)   Wt 59 kg (130 lb)   SpO2 99%   BMI 23.03 kg/m   Physical Exam  Constitutional: She appears well-developed and well-nourished. No distress.  HENT:  Head: Normocephalic and atraumatic.  Neck: Neck supple.  Cardiovascular: Normal rate, regular rhythm and normal heart sounds.   No murmur heard. Pulmonary/Chest: Effort normal and breath sounds normal. No respiratory distress. She has no wheezes. She has no rales.  Musculoskeletal: Normal range of motion.  Neurological: She is alert.  Skin:  Skin is warm and dry.  2x3 burn to the dorsum of the right hand. Bullae to left thumb.  Nursing note and vitals reviewed.    ED Treatments / Results  Labs (all labs ordered are listed, but only abnormal results are displayed) Labs Reviewed - No data to display  EKG  EKG Interpretation None       Radiology No results found.  Procedures Procedures (including critical care time)  Medications Ordered in ED Medications  bacitracin ointment (not administered)     Initial Impression / Assessment and Plan / ED Course  I have reviewed the triage vital signs and the nursing notes.  Pertinent labs & imaging results that were available during my care of the patient were reviewed by me and considered in my medical decision making (see chart for details).    [facility] is a 51 y.o. female who presents to ED for burn to right hand and left thumb which occurred yesterday. No signs of superimposed infection. Wound dressed and bacitracin ointment applied. Wound care clinic referral given if symptoms persist. Reasons to return to ER / signs of infection discussed. All questions answered.    Final Clinical Impressions(s) / ED Diagnoses   Final diagnoses:  Burn of back of right hand, unspecified burn degree, initial encounter    New Prescriptions New Prescriptions   BACITRACIN OINTMENT    Apply 1 application topically 2 (two) times daily.     Maximina Pirozzi, Ozella Almond, PA-C 12/23/16 1448    Blanchie Dessert, MD 12/23/16 [phone]

## 2016-12-27 DIAGNOSIS — M87180 Osteonecrosis due to drugs, jaw: Secondary | ICD-10-CM | POA: Insufficient documentation

## 2016-12-27 DIAGNOSIS — Z96651 Presence of right artificial knee joint: Secondary | ICD-10-CM | POA: Insufficient documentation

## 2016-12-27 DIAGNOSIS — F17200 Nicotine dependence, unspecified, uncomplicated: Secondary | ICD-10-CM | POA: Insufficient documentation

## 2016-12-27 DIAGNOSIS — E890 Postprocedural hypothyroidism: Secondary | ICD-10-CM | POA: Diagnosis not present

## 2016-12-27 DIAGNOSIS — T458X5A Adverse effect of other primarily systemic and hematological agents, initial encounter: Secondary | ICD-10-CM

## 2016-12-27 DIAGNOSIS — Z720 Tobacco use: Secondary | ICD-10-CM | POA: Insufficient documentation

## 2016-12-27 DIAGNOSIS — M818 Other osteoporosis without current pathological fracture: Secondary | ICD-10-CM | POA: Diagnosis not present

## 2016-12-27 DIAGNOSIS — Z5181 Encounter for therapeutic drug level monitoring: Secondary | ICD-10-CM | POA: Diagnosis not present

## 2016-12-27 DIAGNOSIS — Z9229 Personal history of other drug therapy: Secondary | ICD-10-CM | POA: Insufficient documentation

## 2016-12-29 ENCOUNTER — Telehealth: Payer: Self-pay | Admitting: Neurology

## 2016-12-29 DIAGNOSIS — M818 Other osteoporosis without current pathological fracture: Secondary | ICD-10-CM | POA: Diagnosis not present

## 2016-12-29 DIAGNOSIS — Z9229 Personal history of other drug therapy: Secondary | ICD-10-CM | POA: Diagnosis not present

## 2016-12-29 NOTE — Telephone Encounter (Signed)
The patient's medication was scheduled for delivery today, 11/07. It did not arrive. I called the pharmacy and inquired about why they did not ship the medication. They informed me that it was because the patient hadn't paid the copay. I asked her if I could conference the patient into the call. We called the patient, she told me she spoke with the pharmacy last night and paid the copay. The representative with [facility] did not have any record of this. The patient became very upset. She stated that she has never had a copay for botox before and she continued to say that because of our office insisting she uses [facility] she now has a copay and she is not happy about this. I offered her information about the reimbursement program but she said that it requires she submit receipts and she is having a hard time getting these from the pharmacy. Copay was paid and medication will be here tomorrow.

## 2016-12-30 ENCOUNTER — Encounter: Payer: Self-pay | Admitting: Neurology

## 2016-12-30 ENCOUNTER — Ambulatory Visit (INDEPENDENT_AMBULATORY_CARE_PROVIDER_SITE_OTHER): Payer: BLUE CROSS/BLUE SHIELD | Admitting: Neurology

## 2016-12-30 VITALS — BP 96/66 | HR 77

## 2016-12-30 DIAGNOSIS — G43711 Chronic migraine without aura, intractable, with status migrainosus: Secondary | ICD-10-CM

## 2016-12-30 NOTE — Telephone Encounter (Signed)
[employer]

## 2016-12-30 NOTE — Progress Notes (Signed)
Botox 100 units x 2 vials LOT: [id number] EXP: 06/2019 NDC: 891-6945-03 B/B Bacteriostatic 0.9% Sodium Chloride 4 mL LOT: [id number] EXP: 06/23/2018 NDC: 0349-1791-50 //BCrn

## 2016-12-30 NOTE — Telephone Encounter (Signed)
Patient called to tell me she received a text about her botox asking for another payment. She is upset. She wants to know why they keep asking her for payments. I assured her that the medication was already here in our office and she could come on in. She said that she has come into our office before and we didn't have the medication and this was why she was worried, I cant find a record of this happening. She states that the pharmacy has charged her card numerous times, I asked her if she checked her bank account to see if the charges have come out of her account and she informed me she is unable to do that because her husband handles that. I encouraged her to check her account to see if the money has been charged 3 times and if so that is something we will need to take up with the pharmacy.

## 2016-12-30 NOTE — Progress Notes (Signed)

## 2017-01-03 DIAGNOSIS — Z9889 Other specified postprocedural states: Secondary | ICD-10-CM | POA: Diagnosis not present

## 2017-01-03 DIAGNOSIS — X12XXXD Contact with other hot fluids, subsequent encounter: Secondary | ICD-10-CM | POA: Diagnosis not present

## 2017-01-03 DIAGNOSIS — Z881 Allergy status to other antibiotic agents status: Secondary | ICD-10-CM | POA: Diagnosis not present

## 2017-01-03 DIAGNOSIS — M25512 Pain in left shoulder: Secondary | ICD-10-CM | POA: Diagnosis not present

## 2017-01-03 DIAGNOSIS — T310 Burns involving less than 10% of body surface: Secondary | ICD-10-CM | POA: Diagnosis not present

## 2017-01-03 DIAGNOSIS — T300 Burn of unspecified body region, unspecified degree: Secondary | ICD-10-CM | POA: Diagnosis not present

## 2017-01-03 DIAGNOSIS — T23301D Burn of third degree of right hand, unspecified site, subsequent encounter: Secondary | ICD-10-CM | POA: Diagnosis not present

## 2017-01-03 DIAGNOSIS — Z91040 Latex allergy status: Secondary | ICD-10-CM | POA: Diagnosis not present

## 2017-01-03 DIAGNOSIS — F17200 Nicotine dependence, unspecified, uncomplicated: Secondary | ICD-10-CM | POA: Diagnosis not present

## 2017-01-03 DIAGNOSIS — Z888 Allergy status to other drugs, medicaments and biological substances status: Secondary | ICD-10-CM | POA: Diagnosis not present

## 2017-01-03 DIAGNOSIS — T23002D Burn of unspecified degree of left hand, unspecified site, subsequent encounter: Secondary | ICD-10-CM | POA: Diagnosis not present

## 2017-01-09 IMAGING — CR DG SHOULDER 1V*L*
1 series · 1 of 1 positions shown · non-contrast
Comparison: Left shoulder MRI November 10, 2015

CLINICAL DATA: Status post shoulder arthropathy on the left

EXAM:
LEFT SHOULDER - 1 VIEW

[AP]
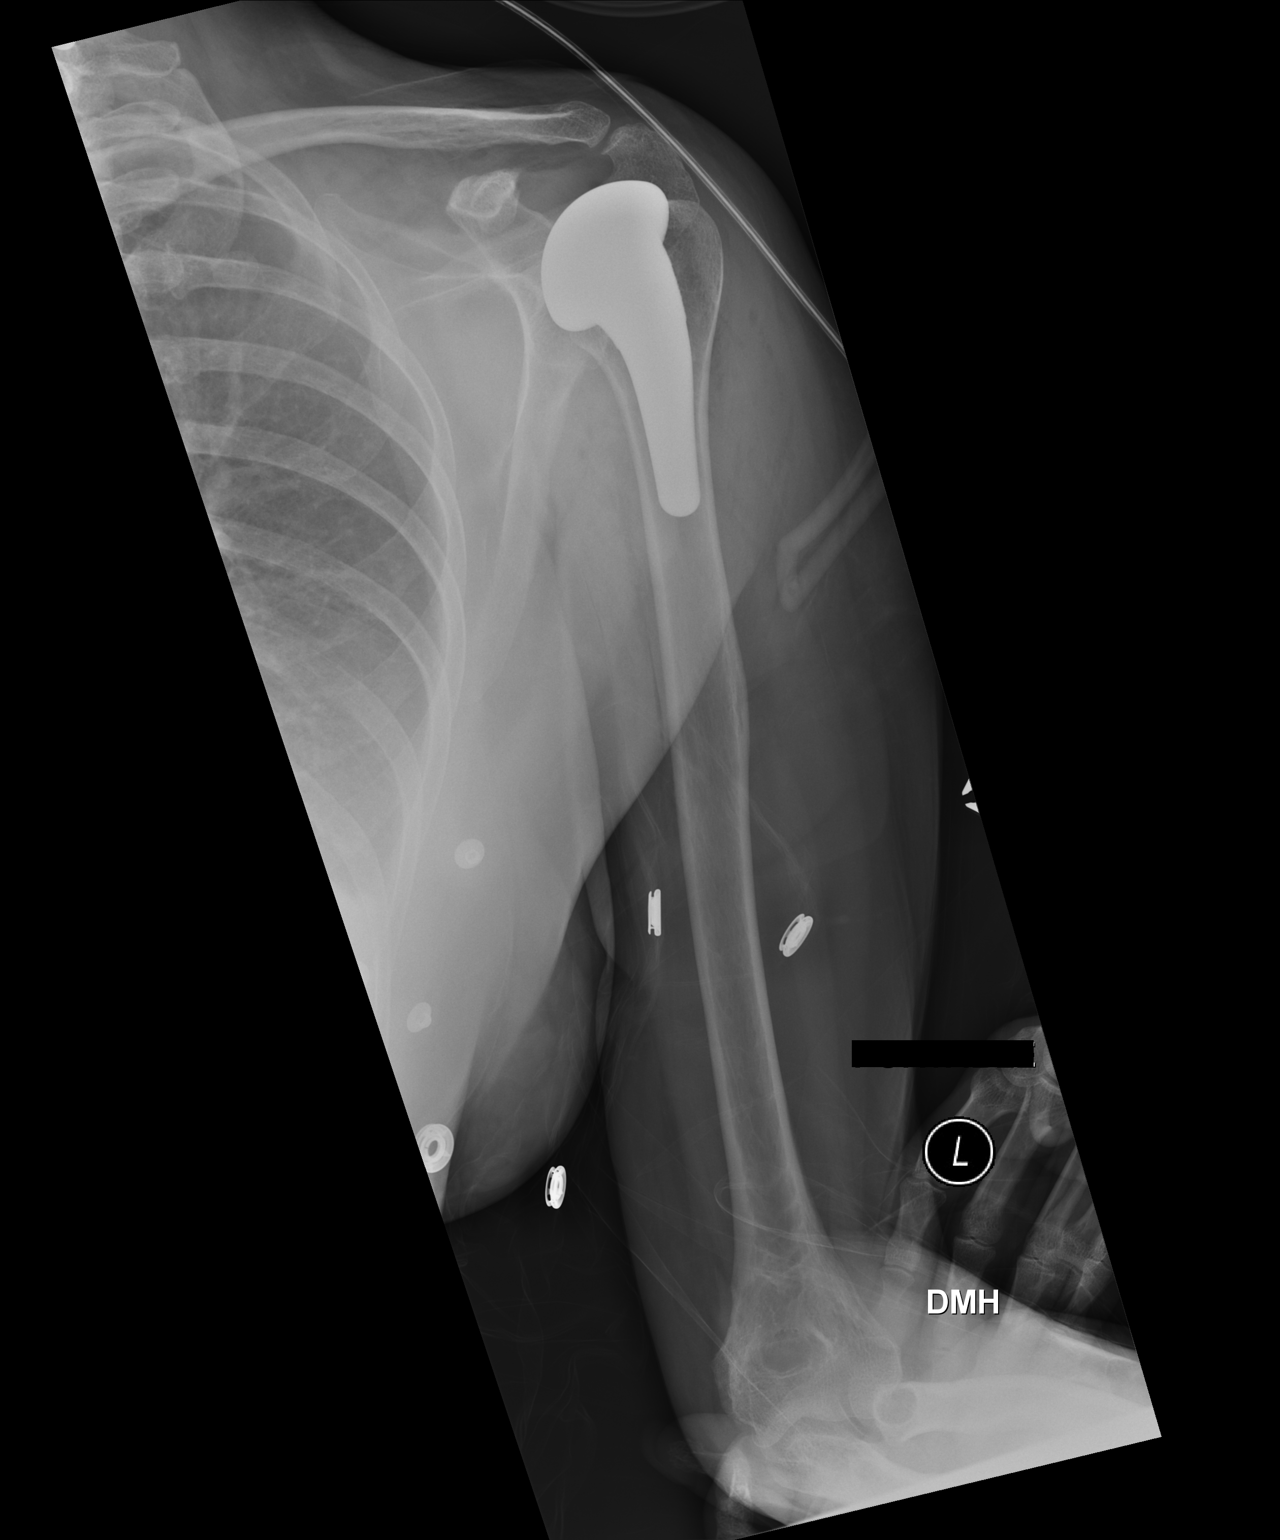

[1 of 1 positions shown; findings below may reference images not displayed]

FINDINGS: Frontal view obtained. There is a total shoulder replacement with
prosthetic component well-seated. No acute fracture or dislocation.
No erosive change. Visualized left lung clear peer
IMPRESSION: Prosthesis well-seated in the proximal humerus on frontal view. No
fracture or dislocation evident.

## 2017-01-10 DIAGNOSIS — T300 Burn of unspecified body region, unspecified degree: Secondary | ICD-10-CM | POA: Diagnosis not present

## 2017-01-18 ENCOUNTER — Other Ambulatory Visit: Payer: Self-pay | Admitting: Allergy and Immunology

## 2017-01-18 NOTE — Telephone Encounter (Signed)
Received fax for 90 day supply for Flonase. Patient was last seen 06/14/2016. Patient was to return in 4 months. Patient needs an office visit.

## 2017-02-08 DIAGNOSIS — F1120 Opioid dependence, uncomplicated: Secondary | ICD-10-CM | POA: Diagnosis not present

## 2017-02-08 DIAGNOSIS — G894 Chronic pain syndrome: Secondary | ICD-10-CM | POA: Diagnosis not present

## 2017-02-08 DIAGNOSIS — M5412 Radiculopathy, cervical region: Secondary | ICD-10-CM | POA: Diagnosis not present

## 2017-02-08 DIAGNOSIS — G8929 Other chronic pain: Secondary | ICD-10-CM | POA: Diagnosis not present

## 2017-02-08 DIAGNOSIS — Z79891 Long term (current) use of opiate analgesic: Secondary | ICD-10-CM | POA: Diagnosis not present

## 2017-02-08 DIAGNOSIS — M545 Low back pain: Secondary | ICD-10-CM | POA: Diagnosis not present

## 2017-02-09 ENCOUNTER — Telehealth: Payer: Self-pay | Admitting: Neurology

## 2017-02-09 DIAGNOSIS — T300 Burn of unspecified body region, unspecified degree: Secondary | ICD-10-CM | POA: Diagnosis not present

## 2017-02-09 NOTE — Telephone Encounter (Signed)
Pt called today wanting to schedule her next botox apt in Jan.

## 2017-02-11 NOTE — Telephone Encounter (Signed)
I called and scheduled the patient for her next injection.  °

## 2017-02-16 DIAGNOSIS — M8700 Idiopathic aseptic necrosis of unspecified bone: Secondary | ICD-10-CM | POA: Insufficient documentation

## 2017-02-16 DIAGNOSIS — M8588 Other specified disorders of bone density and structure, other site: Secondary | ICD-10-CM | POA: Diagnosis not present

## 2017-02-16 DIAGNOSIS — D649 Anemia, unspecified: Secondary | ICD-10-CM | POA: Diagnosis not present

## 2017-02-16 DIAGNOSIS — D573 Sickle-cell trait: Secondary | ICD-10-CM | POA: Diagnosis not present

## 2017-03-11 DIAGNOSIS — F1120 Opioid dependence, uncomplicated: Secondary | ICD-10-CM | POA: Diagnosis not present

## 2017-03-11 DIAGNOSIS — M79652 Pain in left thigh: Secondary | ICD-10-CM | POA: Diagnosis not present

## 2017-03-11 DIAGNOSIS — B0223 Postherpetic polyneuropathy: Secondary | ICD-10-CM | POA: Diagnosis not present

## 2017-03-11 DIAGNOSIS — E039 Hypothyroidism, unspecified: Secondary | ICD-10-CM | POA: Diagnosis not present

## 2017-03-11 DIAGNOSIS — M797 Fibromyalgia: Secondary | ICD-10-CM | POA: Diagnosis not present

## 2017-03-11 DIAGNOSIS — E538 Deficiency of other specified B group vitamins: Secondary | ICD-10-CM | POA: Diagnosis not present

## 2017-03-11 DIAGNOSIS — M5412 Radiculopathy, cervical region: Secondary | ICD-10-CM | POA: Diagnosis not present

## 2017-03-11 DIAGNOSIS — Z9109 Other allergy status, other than to drugs and biological substances: Secondary | ICD-10-CM | POA: Diagnosis not present

## 2017-03-11 DIAGNOSIS — G894 Chronic pain syndrome: Secondary | ICD-10-CM | POA: Diagnosis not present

## 2017-03-11 DIAGNOSIS — Z79891 Long term (current) use of opiate analgesic: Secondary | ICD-10-CM | POA: Diagnosis not present

## 2017-03-11 DIAGNOSIS — M5431 Sciatica, right side: Secondary | ICD-10-CM | POA: Diagnosis not present

## 2017-03-11 DIAGNOSIS — M25469 Effusion, unspecified knee: Secondary | ICD-10-CM | POA: Diagnosis not present

## 2017-03-11 DIAGNOSIS — M25562 Pain in left knee: Secondary | ICD-10-CM | POA: Diagnosis not present

## 2017-03-11 DIAGNOSIS — G8929 Other chronic pain: Secondary | ICD-10-CM | POA: Diagnosis not present

## 2017-03-11 DIAGNOSIS — G890 Central pain syndrome: Secondary | ICD-10-CM | POA: Diagnosis not present

## 2017-03-15 ENCOUNTER — Telehealth: Payer: Self-pay | Admitting: Neurology

## 2017-03-15 NOTE — Telephone Encounter (Signed)
Called patient and LVM asking for call back.  Aimovig was sent to [facility] in [facility] in October with 11 refills. She should still be able to request her refills through them.

## 2017-03-15 NOTE — Telephone Encounter (Signed)
Pt said that she hasnt received her Erenumab-aooe (AIMOVIG) 56 MG/ML SOAJ in a while.  Pt states she has some left but doesn't recall how to request refill for it.  Pt asking for a call back

## 2017-03-15 NOTE — Telephone Encounter (Signed)
Patient returned call. She is going to call [employer] directly as she had already talked to them and they were going to send her Aimovig for a year. She will call us back if she cannot speak with them.

## 2017-03-15 NOTE — Telephone Encounter (Signed)
Pt is asking for a call to know if her Botox has already come in since she has been billed for it.  Please call

## 2017-03-16 NOTE — Telephone Encounter (Signed)
I called the patient back to discuss the medication. I told her we do not have any medication on hand and informed her that I spoke with the pharmacy and the last charge she paid was for her previous injection. She is going to call the pharmacy.

## 2017-03-21 DIAGNOSIS — L281 Prurigo nodularis: Secondary | ICD-10-CM | POA: Diagnosis not present

## 2017-03-24 NOTE — Telephone Encounter (Signed)
Patient called back and requested to know why she is going to have to pay because "at her last neurologist she didn't have to pay". I took the call and spoke with her regarding this, she wanted to know why the medication isn't already here since she has already paid for it, I reminded her that we spoke about this last week and the payment she made was for her previous injection. She also wanted to know why she has to continue using the specialty pharmacy even though she did not have to at her previous neurologist. I informed her that, like I have told her before, that is our offices policy and unfortunately we don't choose the speciality pharmacy that she has to use, her insurance chooses that. She asked for the phone number to the pharmacy, I gave her the phone number and she stated that she would call.

## 2017-03-24 NOTE — Telephone Encounter (Signed)
I called to schedule the patients medication for delivery and the pharmacy informed me that the patient never called back. I called the patient to let her know I could not schedule delivery without consent to the pharmacy but she did not answer so I left a VM asking her to call me back today.

## 2017-03-27 ENCOUNTER — Telehealth: Payer: Self-pay | Admitting: *Deleted

## 2017-03-27 NOTE — Telephone Encounter (Signed)
Spoke with pt and informed her that Dr. Jaynee Eagles will be unexpectedly out of office and pt's Botox appt for 2/4 will need to be r/s. She verbalized understanding & I r/s her for Tuesday 04/05/17 @ 4:30 arrival time 4:15.

## 2017-03-28 ENCOUNTER — Ambulatory Visit: Payer: BLUE CROSS/BLUE SHIELD | Admitting: Neurology

## 2017-03-28 DIAGNOSIS — B349 Viral infection, unspecified: Secondary | ICD-10-CM | POA: Diagnosis not present

## 2017-03-28 DIAGNOSIS — H6981 Other specified disorders of Eustachian tube, right ear: Secondary | ICD-10-CM | POA: Diagnosis not present

## 2017-04-05 ENCOUNTER — Ambulatory Visit (INDEPENDENT_AMBULATORY_CARE_PROVIDER_SITE_OTHER): Payer: BLUE CROSS/BLUE SHIELD | Admitting: Neurology

## 2017-04-05 ENCOUNTER — Encounter: Payer: Self-pay | Admitting: Neurology

## 2017-04-05 ENCOUNTER — Telehealth: Payer: Self-pay | Admitting: Neurology

## 2017-04-05 VITALS — BP 107/67 | HR 92 | Ht 63.0 in | Wt 126.4 lb

## 2017-04-05 DIAGNOSIS — G43711 Chronic migraine without aura, intractable, with status migrainosus: Secondary | ICD-10-CM

## 2017-04-05 DIAGNOSIS — M7918 Myalgia, other site: Secondary | ICD-10-CM

## 2017-04-05 NOTE — Progress Notes (Signed)
Interval history 04/05/2017: She has shingles today and she is in pain in her right hip. She has had shinges multiple times. She has had the flu as well and she was in the bed sick and she had some headaches. Her baseline is daily migraines and since her botox (this is her third shot) she has only had one very bad migraine, tremendous improvement. She has a headache today but she is sick.  Physical Therapy: Cervical myofascial pain, forward posture contributing to migraines and cervicalgia. Please evaluate and treat including dry needling, stretching, strengthening, manual therapy/massage, heating, TENS unit, exercising for scapular stabilization, pectoral stretching and rhomboid strengthening as clinically warranted as well as any other modality as recommended by evaluation.     Consent Form Botulism Toxin Injection For Chronic Migraine  Botulism toxin has been approved by the Federal drug administration for treatment of chronic migraine. Botulism toxin does not cure chronic migraine and it may not be effective in some patients.  The administration of botulism toxin is accomplished by injecting a small amount of toxin into the muscles of the neck and head. Dosage must be titrated for each individual. Any benefits resulting from botulism toxin tend to wear off after 3 months with a repeat injection required if benefit is to be maintained. Injections are usually done every 3-4 months with maximum effect peak achieved by about 2 or 3 weeks. Botulism toxin is expensive and you should be sure of what costs you will incur resulting from the injection.  The side effects of botulism toxin use for chronic migraine may include:   -Transient, and usually mild, facial weakness with facial injections  -Transient, and usually mild, head or neck weakness with head/neck injections  -Reduction or loss of forehead facial animation due to forehead muscle              weakness  -Eyelid drooping  -Dry eye  -Pain  at the site of injection or bruising at the site of injection  -Double vision  -Potential unknown long term risks  Contraindications: You should not have Botox if you are pregnant, nursing, allergic to albumin, have an infection, skin condition, or muscle weakness at the site of the injection, or have myasthenia gravis, Lambert-Eaton syndrome, or ALS.  It is also possible that as with any injection, there may be an allergic reaction or no effect from the medication. Reduced effectiveness after repeated injections is sometimes seen and rarely infection at the injection site may occur. All care will be taken to prevent these side effects. If therapy is given over a long time, atrophy and wasting in the muscle injected may occur. Occasionally the patient's become refractory to treatment because they develop antibodies to the toxin. In this event, therapy needs to be modified.  I have read the above information and consent to the administration of botulism toxin.    ______________  _____   _________________  Patient signature     Date   Witness signature       BOTOX PROCEDURE NOTE FOR MIGRAINE HEADACHE    Contraindications and precautions discussed with patient(above). Aseptic procedure was observed and patient tolerated procedure. Procedure performed by Dr. Georgia Dom  The condition has existed for more than 6 months, and pt does not have a diagnosis of ALS, Myasthenia Gravis or Lambert-Eaton Syndrome. Risks and benefits of injections discussed and pt agrees to proceed with the procedure. Written consent obtained  These injections are medically necessary. He receives good benefits from these  injections. These injections do not cause sedations or hallucinations which the oral therapies may cause.  Indication/Diagnosis: chronic migraine BOTOX(J0585) injection was performed according to protocol by [employer]. 200 units of BOTOX was dissolved into 4 cc NS.  NDC: [address]-94  Type of  toxin: Botox  Botox- 100 units x 2 vials Lot: [id number] Expiration: 09/2019 NDC: 8003-4917-91  Bacteriostatic 0.9% Sodium Chloride- 30mL total Lot: [id number] Expiration: 06/23/2018 NDC: 9480-1655-37  Description of procedure:  The patient was placed in a sitting position. The standard protocol was used for Botox as follows, with 5 units of Botox injected at each site:   -Procerus muscle, midline injection  -Corrugator muscle, bilateral injection  -Frontalis muscle, bilateral injection, with 2 sites each side, medial injection was performed in the upper one third of the frontalis muscle, in the region vertical from the medial inferior edge of the superior orbital rim. The lateral injection was again in the upper one third of the forehead vertically above the lateral limbus of the cornea, 1.5 cm lateral to the medial injection site.  -Temporalis muscle injection, 4 sites, bilaterally. The first injection was 3 cm above the tragus of the ear, second injection site was 1.5 cm to 3 cm up from the first injection site in line with the tragus of the ear. The third injection site was 1.5-3 cm forward between the first 2 injection sites. The fourth injection site was 1.5 cm posterior to the second injection site.  -Occipitalis muscle injection, 3 sites, bilaterally. The first injection was done one half way between the occipital protuberance and the tip of the mastoid process behind the ear. The second injection site was done lateral and superior to the first, 1 fingerbreadth from the first injection. The third injection site was 1 fingerbreadth superiorly and medially from the first injection site.  -Cervical paraspinal muscle injection, 2 sites, bilateral knee first injection site was 1 cm from the midline of the cervical spine, 3 cm inferior to the lower border of the occipital protuberance. The second injection site was 1.5 cm superiorly and laterally to the first injection site.  -Trapezius  muscle injection was performed at 3 sites, bilaterally. The first injection site was in the upper trapezius muscle halfway between the inflection point of the neck, and the acromion. The second injection site was one half way between the acromion and the first injection site. The third injection was done between the first injection site and the inflection point of the neck.   Will return for repeat injection in 3 months.   A 200 unit sof Botox was used, 155 units were injected, the rest of the Botox was wasted. The patient tolerated the procedure well, there were no complications of the above procedure.

## 2017-04-05 NOTE — Telephone Encounter (Signed)
Pt. Needs 12 wk Botox apt

## 2017-04-06 ENCOUNTER — Ambulatory Visit: Payer: BLUE CROSS/BLUE SHIELD | Admitting: Neurology

## 2017-04-06 NOTE — Addendum Note (Signed)
Addended by: Sarina Ill B on: 04/06/2017 07:59 PM   Modules accepted: Level of Service

## 2017-04-07 NOTE — Telephone Encounter (Signed)
I called the patient to schedule her next injection but she did not answer. I left her a VM asking her to call me back.

## 2017-04-17 DIAGNOSIS — T23312A Burn of third degree of left thumb (nail), initial encounter: Secondary | ICD-10-CM | POA: Diagnosis not present

## 2017-04-25 DIAGNOSIS — F1120 Opioid dependence, uncomplicated: Secondary | ICD-10-CM | POA: Diagnosis not present

## 2017-04-25 DIAGNOSIS — G8929 Other chronic pain: Secondary | ICD-10-CM | POA: Diagnosis not present

## 2017-04-25 DIAGNOSIS — Z79891 Long term (current) use of opiate analgesic: Secondary | ICD-10-CM | POA: Diagnosis not present

## 2017-04-25 DIAGNOSIS — G894 Chronic pain syndrome: Secondary | ICD-10-CM | POA: Diagnosis not present

## 2017-04-26 DIAGNOSIS — Z95828 Presence of other vascular implants and grafts: Secondary | ICD-10-CM | POA: Insufficient documentation

## 2017-05-09 DIAGNOSIS — B373 Candidiasis of vulva and vagina: Secondary | ICD-10-CM | POA: Diagnosis not present

## 2017-05-14 ENCOUNTER — Other Ambulatory Visit: Payer: Self-pay | Admitting: Allergy and Immunology

## 2017-05-16 ENCOUNTER — Encounter: Payer: Self-pay | Admitting: Pain Medicine

## 2017-05-16 ENCOUNTER — Ambulatory Visit: Payer: BLUE CROSS/BLUE SHIELD | Attending: Pain Medicine | Admitting: Pain Medicine

## 2017-05-16 VITALS — BP 128/85 | HR 88 | Temp 97.6°F | Resp 16 | Ht 63.0 in | Wt 124.0 lb

## 2017-05-16 DIAGNOSIS — M4322 Fusion of spine, cervical region: Secondary | ICD-10-CM | POA: Insufficient documentation

## 2017-05-16 DIAGNOSIS — G90529 Complex regional pain syndrome I of unspecified lower limb: Secondary | ICD-10-CM | POA: Diagnosis not present

## 2017-05-16 DIAGNOSIS — Z883 Allergy status to other anti-infective agents status: Secondary | ICD-10-CM | POA: Insufficient documentation

## 2017-05-16 DIAGNOSIS — M797 Fibromyalgia: Secondary | ICD-10-CM | POA: Insufficient documentation

## 2017-05-16 DIAGNOSIS — M818 Other osteoporosis without current pathological fracture: Secondary | ICD-10-CM | POA: Insufficient documentation

## 2017-05-16 DIAGNOSIS — Z885 Allergy status to narcotic agent status: Secondary | ICD-10-CM | POA: Insufficient documentation

## 2017-05-16 DIAGNOSIS — Z87891 Personal history of nicotine dependence: Secondary | ICD-10-CM | POA: Insufficient documentation

## 2017-05-16 DIAGNOSIS — M25561 Pain in right knee: Secondary | ICD-10-CM | POA: Diagnosis not present

## 2017-05-16 DIAGNOSIS — T84099A Other mechanical complication of unspecified internal joint prosthesis, initial encounter: Secondary | ICD-10-CM | POA: Diagnosis not present

## 2017-05-16 DIAGNOSIS — Z8719 Personal history of other diseases of the digestive system: Secondary | ICD-10-CM | POA: Insufficient documentation

## 2017-05-16 DIAGNOSIS — Z881 Allergy status to other antibiotic agents status: Secondary | ICD-10-CM | POA: Insufficient documentation

## 2017-05-16 DIAGNOSIS — G43719 Chronic migraine without aura, intractable, without status migrainosus: Secondary | ICD-10-CM | POA: Diagnosis not present

## 2017-05-16 DIAGNOSIS — G90521 Complex regional pain syndrome I of right lower limb: Secondary | ICD-10-CM | POA: Diagnosis not present

## 2017-05-16 DIAGNOSIS — K224 Dyskinesia of esophagus: Secondary | ICD-10-CM | POA: Insufficient documentation

## 2017-05-16 DIAGNOSIS — H9190 Unspecified hearing loss, unspecified ear: Secondary | ICD-10-CM | POA: Diagnosis not present

## 2017-05-16 DIAGNOSIS — R042 Hemoptysis: Secondary | ICD-10-CM | POA: Diagnosis not present

## 2017-05-16 DIAGNOSIS — H6060 Unspecified chronic otitis externa, unspecified ear: Secondary | ICD-10-CM | POA: Insufficient documentation

## 2017-05-16 DIAGNOSIS — N899 Noninflammatory disorder of vagina, unspecified: Secondary | ICD-10-CM | POA: Diagnosis not present

## 2017-05-16 DIAGNOSIS — K581 Irritable bowel syndrome with constipation: Secondary | ICD-10-CM | POA: Insufficient documentation

## 2017-05-16 DIAGNOSIS — K5900 Constipation, unspecified: Secondary | ICD-10-CM | POA: Diagnosis not present

## 2017-05-16 DIAGNOSIS — Y792 Prosthetic and other implants, materials and accessory orthopedic devices associated with adverse incidents: Secondary | ICD-10-CM | POA: Insufficient documentation

## 2017-05-16 DIAGNOSIS — Z79899 Other long term (current) drug therapy: Secondary | ICD-10-CM | POA: Insufficient documentation

## 2017-05-16 DIAGNOSIS — J4540 Moderate persistent asthma, uncomplicated: Secondary | ICD-10-CM | POA: Insufficient documentation

## 2017-05-16 DIAGNOSIS — I889 Nonspecific lymphadenitis, unspecified: Secondary | ICD-10-CM | POA: Diagnosis not present

## 2017-05-16 DIAGNOSIS — Z96651 Presence of right artificial knee joint: Secondary | ICD-10-CM

## 2017-05-16 DIAGNOSIS — G894 Chronic pain syndrome: Secondary | ICD-10-CM | POA: Insufficient documentation

## 2017-05-16 DIAGNOSIS — J340 Abscess, furuncle and carbuncle of nose: Secondary | ICD-10-CM | POA: Insufficient documentation

## 2017-05-16 DIAGNOSIS — J45909 Unspecified asthma, uncomplicated: Secondary | ICD-10-CM | POA: Insufficient documentation

## 2017-05-16 DIAGNOSIS — Z452 Encounter for adjustment and management of vascular access device: Secondary | ICD-10-CM | POA: Diagnosis not present

## 2017-05-16 DIAGNOSIS — Z882 Allergy status to sulfonamides status: Secondary | ICD-10-CM | POA: Insufficient documentation

## 2017-05-16 DIAGNOSIS — F411 Generalized anxiety disorder: Secondary | ICD-10-CM | POA: Diagnosis not present

## 2017-05-16 DIAGNOSIS — J342 Deviated nasal septum: Secondary | ICD-10-CM | POA: Insufficient documentation

## 2017-05-16 DIAGNOSIS — M5136 Other intervertebral disc degeneration, lumbar region: Secondary | ICD-10-CM | POA: Insufficient documentation

## 2017-05-16 DIAGNOSIS — Z79891 Long term (current) use of opiate analgesic: Secondary | ICD-10-CM | POA: Insufficient documentation

## 2017-05-16 DIAGNOSIS — J3489 Other specified disorders of nose and nasal sinuses: Secondary | ICD-10-CM | POA: Diagnosis not present

## 2017-05-16 DIAGNOSIS — R252 Cramp and spasm: Secondary | ICD-10-CM | POA: Insufficient documentation

## 2017-05-16 DIAGNOSIS — Z809 Family history of malignant neoplasm, unspecified: Secondary | ICD-10-CM | POA: Insufficient documentation

## 2017-05-16 DIAGNOSIS — G8929 Other chronic pain: Secondary | ICD-10-CM | POA: Diagnosis not present

## 2017-05-16 DIAGNOSIS — E039 Hypothyroidism, unspecified: Secondary | ICD-10-CM | POA: Diagnosis not present

## 2017-05-16 DIAGNOSIS — Z888 Allergy status to other drugs, medicaments and biological substances status: Secondary | ICD-10-CM | POA: Insufficient documentation

## 2017-05-16 DIAGNOSIS — Z806 Family history of leukemia: Secondary | ICD-10-CM | POA: Insufficient documentation

## 2017-05-16 DIAGNOSIS — A630 Anogenital (venereal) warts: Secondary | ICD-10-CM | POA: Diagnosis not present

## 2017-05-16 DIAGNOSIS — Z8489 Family history of other specified conditions: Secondary | ICD-10-CM | POA: Insufficient documentation

## 2017-05-16 NOTE — Progress Notes (Signed)
Patient's Name: Kari Green  MRN: [id number]  Referring Provider: Thressa Sheller, MD  DOB: 08-14-1965  PCP: Thressa Sheller, MD  DOS: 05/16/2017  Note by: Gaspar Cola, MD  Service setting: Ambulatory outpatient  Specialty: Interventional Pain Management  Location: [facility] (AMB) Pain Management Facility    Patient type: Established   Primary Reason(s) for Visit: Encounter for post-procedure evaluation of chronic illness with mild to moderate exacerbation CC: Knee Pain (right) and Leg Pain (right)  HPI  Kari Green is a 52 y.o. year old, female patient, who comes today for a post-procedure evaluation. She has Irritable bowel syndrome with constipation; Abdominal pain, epigastric; Abdominal adhesions; Hypothyroidism; Memory deficits; Moderate persistent asthma; Allergic rhinitis; Chronic low back pain; CRPS (complex regional pain syndrome) type I of lower limb (Right); History of arthroplasty of knee (Right); Chronic pain syndrome; Chronic shoulder pain (Left); Chronic knee pain (Secondary Area of Pain) (Right); Generalized anxiety disorder; Chronic lower extremity pain (Primary Area of Pain) (Right); Multiple drug allergies (codeine, Lidoderm a DC if, sulfa, latex, IVP dye, Keflex, Augmentin); Latex allergy; Long term current use of opiate analgesic; Long term prescription opiate use; Opiate use; Chronic, continuous use of opioids; History of allergy to IVP dye; Temporomandibular joint disorder; Buzzing in ear; Ear pain, referred; Idiopathic osteoporosis; Nasal septal perforation; History of migraine headaches; Difficulty hearing; H/O respiratory system disease; Fibromyalgia; Deflected nasal septum; Chronic otitis externa; Abnormal auditory perception; Muscle cramps at night; Radiculopathy; Hemoptysis; Cough; S/P shoulder hemiarthroplasty (Left); Cervical lymphadenitis; Venereal wart; Failed total knee, left ([place]); Hearing loss; Nasal ulcer; Vaginal mass; Chronic knee pain after total  replacement of knee joint (Right); Intractable chronic migraine without aura and with status migrainosus; Cancer ([place]); Asthma; Avascular necrosis of bone ([place]); Burn injury; Current smoker; History of bisphosphonate therapy; 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 Her primarily concern today is the Knee Pain (right) and Leg Pain (right)  Pain Assessment: Location: Right Knee(leg pain in upper thigh that is tender to the touch) Radiating: na for knee.   Onset: More than a month ago Duration: Chronic pain Quality: Discomfort, Sore, Burning, Throbbing, Tender Severity: 8 /10 (self-reported pain score)  Note: Reported level is inconsistent with clinical observations. Clinically the patient looks like a 3/10 A 3/10 is viewed as "Moderate" and described as significantly interfering with activities of daily living (ADL). It becomes difficult to feed, bathe, get dressed, get on and off the toilet or to perform personal hygiene functions. Difficult to get in and out of bed or a chair without assistance. Very distracting. With effort, it can be ignored when deeply involved in activities. Information on the proper use of the pain scale provided to the patient today. When using our objective Pain Scale, levels between 6 and 10/10 are said to belong in an emergency room, as it progressively worsens from a 6/10, described as severely limiting, requiring emergency care not usually available at an outpatient pain management facility. At a 6/10 level, communication becomes difficult and requires great effort. Assistance to reach the emergency department may be required. Facial flushing and profuse sweating along with potentially dangerous increases in heart rate and blood pressure will be evident. Effect on ADL: limited ROM in the knee.  difficult to stand or  walk on it Timing: Constant Modifying factors: staying off of it as much as possible.  massage therapy  Kari Green comes in today  for post-procedure evaluation after the treatment done on Visit date not found.  This patient has a history of a right-sided failed knee surgeries with a right-sided lower extremity complex regional pain syndrome type I.  We have been successfully treating it (palliatively) with right sided genicular nerve RFA for the knee pain and right sided L3 and L4 lumbar sympathetic RFA for the complex regional pain syndrome.  The patient resides in [place] and she has been getting her pain medications at the "[facility] Shanon Ace, M.D.).  Further details on both, my assessment(s), as well as the proposed treatment plan, please see below.  Post-Procedure Assessment  Visit date not found Procedure: Therapeutic right-sided genicular nerves RFA & right-sided L3 and L4 lumbar sympathetic RFA under fluoroscopic guidance and IV sedation.  The right sided genicular nerve RFA was done on 07/21/2016 (10 months ago).  The right sided L3 and L4 lumbar sympathetic RFA was done on 04/12/2016 (more than 1 year ago). Pre-procedure pain score:  9/10 Post-procedure pain score: 0/10 (100% relief) Influential Factors: BMI: 21.97 kg/m Intra-procedural challenges: None observed.         Assessment challenges: None detected.              Reported side-effects: None.        Post-procedural adverse reactions or complications: None reported         Sedation: Sedation provided. When no sedatives are used, the analgesic levels obtained are directly associated to the effectiveness of the local anesthetics. However, when sedation is provided, the level of analgesia obtained during the initial 1 hour following the intervention, is believed to be the result of a combination of factors. These factors may include, but are not limited to: 1. The effectiveness of the local  anesthetics used. 2. The effects of the analgesic(s) and/or anxiolytic(s) used. 3. The degree of discomfort experienced by the patient at the time of the procedure. 4. The patients ability and reliability in recalling and recording the events. 5. The presence and influence of possible secondary gains and/or psychosocial factors. Reported result: Relief experienced during the 1st hour after the procedure: 100 % (Ultra-Short Term Relief) Ms. Uplinger has indicated area to have been numb during this time. Interpretative annotation: Clinically appropriate result. Analgesia during this period is likely to be Local Anesthetic and/or IV Sedative (Analgesic/Anxiolytic) related.          Effects of local anesthetic: The analgesic effects attained during this period are directly associated to the localized infiltration of local anesthetics and therefore cary significant diagnostic value as to the etiological location, or anatomical origin, of the pain. Expected duration of relief is directly dependent on the pharmacodynamics of the local anesthetic used. Long-acting (4-6 hours) anesthetics used.  Reported result: Relief during the next 4 to 6 hour after the procedure: 100 % (Short-Term Relief) Ms. Carrillo has indicated area to have been numb during this time. Interpretative annotation: Clinically appropriate result. Analgesia during this period is likely to be Local Anesthetic-related.          Long-term benefit: Defined as the period of time past the expected duration of local anesthetics (1 hour for short-acting and 4-6 hours for long-acting). With the possible exception of prolonged sympathetic blockade from the local anesthetics, benefits during this period are typically attributed to, or associated with, other factors such as analgesic sensory neuropraxia, antiinflammatory effects, or beneficial biochemical changes provided by agents other than the local anesthetics.  Reported result: Extended relief  following procedure: 100 %(patient states the pain relief has just recently worn off or has gotten to the point that she can't bear it anymore. ) (Long-Term Relief)            Interpretative annotation: Clinically possible results. Good relief. No permanent benefit expected. Inflammation plays a part in the etiology to the pain.          Current benefits: Defined as reported results that persistent at this point in time.   Analgesia: 0 % Ms. Diggins reports improvement of arthralgia.  Ms. Steffey indicates that after her lumbar sympathetic and genicular nerve radiofrequency she was able to walk completely normal without any pain.  Her last lumbar sympathetic radiofrequency was done on 04/12/2016 (more than a year ago).  Her last genicular nerve radiofrequency was done on 07/21/2016 (almost 10 months ago). Function: Somewhat improved ROM: Somewhat improved Interpretative annotation: Recurrence of symptoms. Therapeutic success. Effective therapeutic approach.          Interpretation: Results would suggest a successful therapeutic intervention.                  Plan:  Based on these results, we will proceed with a repeat Lumbar pathetic radiofrequency ablation and at a later time we will bring her back for a repeat genicular nerve RFA. We have been treating this patient with this type of therapy for the past couple years and she seems to continue attaining excellent benefits from the radiofrequency.  She describes that this last one has worked the best yet and she is looking forward to having it repeated again. At this point we believe it to be medically necessary to proceed with this repeat radiofrequency as the patient has failed all other therapies.  Laboratory Chemistry  Inflammation Markers (CRP: Acute Phase) (ESR: Chronic Phase) Lab Results  Component Value Date   LATICACIDVEN 1.75 09/08/2013                         Renal Function Markers Lab Results  Component Value Date   BUN 20  03/29/2016   CREATININE 0.86 03/29/2016   GFRAA >60 03/29/2016   GFRNONAA >60 03/29/2016                 Hepatic Function Markers Lab Results  Component Value Date   AST 18 12/04/2015   ALT 13 (L) 12/04/2015   ALBUMIN 3.7 12/04/2015   ALKPHOS 56 12/04/2015   LIPASE 28 06/06/2015                 Electrolytes Lab Results  Component Value Date   NA 141 03/29/2016   K 3.4 (L) 03/29/2016   CL 111 03/29/2016   CALCIUM 9.2 03/29/2016                        Coagulation Parameters Lab Results  Component Value Date   INR 0.97 11/04/2016   LABPROT 12.8 11/04/2016   APTT 32 11/04/2016   PLT 307 11/04/2016   DDIMER <0.27 09/22/2012                 Cardiovascular Markers Lab Results  Component Value Date   TROPONINI <0.30 09/08/2013   HGB 13.6 11/04/2016   HCT 39.2 11/04/2016                 Note: Lab results reviewed.  Recent Diagnostic Imaging Results   IR US Guide Vasc Access Right  CLINICAL DATA:  Poor venous access  EXAM: TUNNEL POWER PORT PLACEMENT WITH SUBCUTANEOUS POCKET UTILIZING ULTRASOUND & FLOUROSCOPY  FLUOROSCOPY TIME:  1 minutes and 12 seconds.  Six mGy.  MEDICATIONS AND MEDICAL HISTORY: Versed Three mg, Fentanyl 100 mcg. Benadryl 50 mg. Phenergan 25 mg.  Additional Medications: Vancomycin 1 g. Antibiotics were given within 2 hours of the procedure.  ANESTHESIA/SEDATION: Moderate sedation time: 33 minutes. Nursing monitored the the patient during the procedure.  PROCEDURE: After written informed consent was obtained, patient was placed in the supine position on angiographic table. The right neck and chest was prepped and draped in a sterile fashion. Lidocaine was utilized for local anesthesia. The right jugular vein was noted to be patent initially with ultrasound. Under sonographic guidance, a micropuncture needle was inserted into the right IJ vein (Ultrasound and fluoroscopic image documentation was performed). The needle was removed over an  018 wire which was exchanged for a Amplatz. This was advanced into the IVC. An 8-French dilator was advanced over the Amplatz.  A small incision was made in the right upper chest over the anterior right second rib. Utilizing blunt dissection, a subcutaneous pocket was created in the caudal direction. The pocket was irrigated with a copious amount of sterile normal saline. The port catheter was tunneled from the chest incision, and out the neck incision. The reservoir was inserted into the subcutaneous pocket and secured with two 3-0 Ethilon stitches. A peel-away sheath was advanced over the Amplatz wire. The port catheter was cut to measure length and inserted through the peel-away sheath. The peel-away sheath was removed. The chest incision was closed with 3-0 Vicryl interrupted stitches for the subcutaneous tissue and a running of 4-0 Vicryl subcuticular stitch for the skin. The neck incision was closed with a 4-0 Vicryl subcuticular stitch. Derma-bond was applied to both surgical incisions. The port reservoir was flushed and instilled with heparinized saline. No complications.  FINDINGS: A right IJ vein Port-A-Cath is in place with its tip at the cavoatrial junction.  COMPLICATIONS: None  IMPRESSION: Successful 8 French right internal jugular vein power port placement with its tip at the SVC/RA junction.  Electronically Signed   By: Marybelle Killings M.D.   On: 11/04/2016 16:28   IR FLUORO GUIDE PORT INSERTION RIGHT CLINICAL DATA:  Poor venous access  EXAM: TUNNEL POWER PORT PLACEMENT WITH SUBCUTANEOUS POCKET UTILIZING ULTRASOUND & FLOUROSCOPY  FLUOROSCOPY TIME:  1 minutes and 12 seconds.  Six mGy.  MEDICATIONS AND MEDICAL HISTORY: Versed Three mg, Fentanyl 100 mcg. Benadryl 50 mg. Phenergan 25 mg.  Additional Medications: Vancomycin 1 g. Antibiotics were given within 2 hours of the procedure.  ANESTHESIA/SEDATION: Moderate sedation time: 33 minutes. Nursing  monitored the the patient during the procedure.  PROCEDURE: After written informed consent was obtained, patient was placed in the supine position on angiographic table. The right neck and chest was prepped and draped in a sterile fashion. Lidocaine was utilized for local anesthesia. The right jugular vein was noted to be patent initially with ultrasound. Under sonographic guidance, a micropuncture needle was inserted into the right IJ vein (Ultrasound and fluoroscopic image documentation was performed). The needle was removed over an 018 wire which was exchanged for a Amplatz. This was advanced into the IVC. An 8-French dilator was advanced over the Amplatz.  A small incision was made in the right upper chest over the anterior right second rib. Utilizing blunt dissection, a subcutaneous pocket was created in the caudal direction. The pocket was irrigated  with a copious amount of sterile normal saline. The port catheter was tunneled from the chest incision, and out the neck incision. The reservoir was inserted into the subcutaneous pocket and secured with two 3-0 Ethilon stitches. A peel-away sheath was advanced over the Amplatz wire. The port catheter was cut to measure length and inserted through the peel-away sheath. The peel-away sheath was removed. The chest incision was closed with 3-0 Vicryl interrupted stitches for the subcutaneous tissue and a running of 4-0 Vicryl subcuticular stitch for the skin. The neck incision was closed with a 4-0 Vicryl subcuticular stitch. Derma-bond was applied to both surgical incisions. The port reservoir was flushed and instilled with heparinized saline. No complications.  FINDINGS: A right IJ vein Port-A-Cath is in place with its tip at the cavoatrial junction.  COMPLICATIONS: None  IMPRESSION: Successful 8 French right internal jugular vein power port placement with its tip at the SVC/RA junction.  Electronically Signed   By: Marybelle Killings M.D.   On: 11/04/2016 16:28  Complexity Note: I personally reviewed the fluoroscopic imaging of the procedure.                        Meds   Current Outpatient Medications:  .  albuterol (PROVENTIL) (2.5 MG/3ML) 0.083% nebulizer solution, Take 3 mLs (2.5 mg total) by nebulization every 4 (four) hours as needed for wheezing (or coughing spells)., Disp: 75 vial, Rfl: 1 .  ALPRAZolam (XANAX) 0.5 MG tablet, Take 1 mg by mouth 3 (three) times daily as needed. Anxiety, Disp: , Rfl:  .  cyanocobalamin (,VITAMIN B-12,) 1000 MCG/ML injection, Inject 1,000 mcg into the muscle once a week. Reported on 03/25/2015, Disp: , Rfl:  .  diphenhydrAMINE (BENADRYL) 50 MG tablet, Take 50 mg by mouth every 4 (four) hours as needed for itching. , Disp: , Rfl:  .  EPINEPHrine (EPIPEN 2-PAK) 0.3 mg/0.3 mL IJ SOAJ injection, Inject 0.3 mLs (0.3 mg total) into the muscle as needed (in the event of a severe life-threatening reaction)., Disp: 2 Device, Rfl: 2 .  estradiol (ESTRACE) 0.5 MG tablet, Take 1 mg by mouth at bedtime. , Disp: , Rfl:  .  EVZIO 0.4 MG/0.4ML SOAJ, AUTO INJECT AS NEEDED IN CASE OF EMERGENCY WHERE GRANDCHILDREN MAY GET A HOLD OF PATIENTS PAIN MEDICATIONS., Disp: , Rfl: 0 .  fluticasone (FLONASE) 50 MCG/ACT nasal spray, SPRAY 2 SPRAYS INTO EACH NOSTRIL DAILY, Disp: 16 g, Rfl: 0 .  gabapentin (NEURONTIN) 300 MG capsule, Take 300 mg by mouth 3 (three) times daily., Disp: , Rfl:  .  HYDROmorphone (DILAUDID) 2 MG tablet, Take 1-3 tablets (2-6 mg total) by mouth every 4 (four) hours as needed for moderate pain or severe pain., Disp: 60 tablet, Rfl: 0 .  ketoconazole (NIZORAL) 2 % shampoo, One application once a month as needed for flaking, Disp: , Rfl: 3 .  levothyroxine (SYNTHROID, LEVOTHROID) 50 MCG tablet, Take 200 mcg by mouth daily. Pt can only take name brand, Disp: , Rfl:  .  MethylPREDNISolone Acetate (DEPO-MEDROL IJ), Inject 1 each as directed daily as needed (allergic reaction.)., Disp: , Rfl:   .  Multiple Vitamin (MULTIVITAMIN WITH MINERALS) TABS tablet, Take 1 tablet by mouth at bedtime., Disp: , Rfl:  .  OnabotulinumtoxinA (BOTOX IJ), Inject as directed every 3 (three) months. For migraines, Disp: , Rfl:  .  promethazine (PHENERGAN) 25 MG tablet, Take 1-2 tablets (25-50 mg total) by mouth 2 (two) times daily as needed  for nausea or vomiting., Disp: 20 tablet, Rfl: 0 .  propranolol (INDERAL) 20 MG tablet, Take 40 mg by mouth daily. , Disp: , Rfl:  .  tiZANidine (ZANAFLEX) 4 MG tablet, Take 4 mg by mouth 2 (two) times daily., Disp: , Rfl:  .  topiramate (TOPAMAX) 25 MG tablet, Take 200 mg by mouth daily. , Disp: , Rfl: 1 .  vitamin C (ASCORBIC ACID) 500 MG tablet, Take 1,000 mg by mouth daily., Disp: , Rfl:  .  zolpidem (AMBIEN) 10 MG tablet, Take 10 mg by mouth at bedtime as needed., Disp: , Rfl:   ROS  Constitutional: Denies any fever or chills Gastrointestinal: No reported hemesis, hematochezia, vomiting, or acute GI distress Musculoskeletal: Denies any acute onset joint swelling, redness, loss of ROM, or weakness Neurological: No reported episodes of acute onset apraxia, aphasia, dysarthria, agnosia, amnesia, paralysis, loss of coordination, or loss of consciousness  Allergies  Ms. Murillo is allergic to cobalt; latex; nickel; peanuts [peanut oil]; red dye; azithromycin; chloraprep one step [chlorhexidine gluconate]; corticosteroids; flexeril [cyclobenzaprine]; morphine and related; other; percocet [oxycodone-acetaminophen]; supartz [sodium hyaluronate (avian)]; surgical lubricant; yellow dyes (non-tartrazine); cephalexin; monistat [miconazole]; prednisone; sulfa antibiotics; yellow dye; soap; and tape.  PFSH  Drug: Ms. Blea  reports that she does not use drugs. Alcohol:  reports that she drinks alcohol. Tobacco:  reports that she quit smoking about 11 years ago. She has never used smokeless tobacco. Medical:  has a past medical history of Acute Pain following  procedure (Lumbar Sympathetic Radiofrequency Ablation) (05/06/2015), Anxiety, Arthritis, Asthma, Avascular necrosis ([place]), Cancer ([place]), Chronic, continuous use of opioids, Cognitive dysfunction, Complication of anesthesia, Degenerative disc disease, cervical, Degenerative disc disease, lumbar, Delayed gastric emptying, Dental crowns present, Difficult intravenous access, Difficulty swallowing pills, Esophageal dysmotility, Family history of adverse reaction to anesthesia, Fibromyalgia, Hemangioma of liver, Hemangioma, renal, History of bronchitis, History of hiatal hernia, History of kidney stones (04/2014), History of pneumonia, Hypothyroidism (acquired), IC (interstitial cystitis), Irritable bowel syndrome (IBS), Kidney stone, Knee pain, right, Limited joint range of motion, Memory impairment, Migraines, Osteonecrosis ([place]), Osteoporosis, Pain management, Pain syndrome, chronic, Pneumonia (2016), PONV (postoperative nausea and vomiting), PONV (postoperative nausea and vomiting), Sciatica, and Seasonal allergies. Surgical: Ms. Emery  has a past surgical history that includes Total thyroidectomy (1993); cysto with hydrodistension (01/29/2011); Cystoscopy w/ retrogrades (01/29/2011); Fissurectomy (~ 1988 x2); Refractive surgery (Bilateral, ~ 1999; ~ 2013); Nasal sinus surgery (2013); Laparoscopic lysis of adhesions (02/11/2012); Excision haglund's deformity with achilles tendon repair (Bilateral, 2013-2014); Melanoma excision (Left, 2013); Cystoscopy with stent placement (2006); Appendectomy (~ 1992); Tonsillectomy and adenoidectomy (~ 1989); LAPAROSCOPY ADHESIOLYSIS AND REMOVAL POSSIBLE RIGHT OVARY REMNANT (04/18/2000); Knee arthroscopy (Right, 08/16/2000); Carpal tunnel release (Right, 07/26/2001); DEBRIDEMENT RIGHT THUMB MP JOINT (Right, 04/10/2002); RIGHT THUMB FUSION OF MPJ (Right, 10/03/2002); DECOMPRESSION LEFT ULNAR NERVE, ELBOW (Left, 01/29/2003); RIGHT TOTAL KNEE REVISION ARTHROPLASTY (Right, 03/14/2007); CYSTO/  URETHRAL DILATION/ HYDRODISTENTION/ BLADDER BX (04/10/2003); Laparoscopic assisted vaginal hysterectomy (Feb 1999); LAPAROSCOPY LYSIS ADHESIONS/  BILATERAL SALPINGOOPHORECTOMY/  FULGERATION OF ENDOMETRIOSIS (1998); Anterior cervical decomp/discectomy fusion (03/10/2009); Esophagogastroduodenoscopy (egd) with esophageal dilation (10-26-2000); CYSTO/  BLADDER BX (03/08/2008); transthoracic echocardiogram (09/22/2012); cysto with hydrodistension (N/A, 05/08/2014); laparoscopy (02/11/2012); Transanal hemorrhoidal dearterialization (03/26/2011); Knee arthroscopy with fulkerson slide (Right, 11/29/2000); Knee arthroscopy w/ lateral release (Right, 11/29/2000); Laparoscopic cholecystectomy (03/04/2002); Total knee arthroplasty (Right, 09/10/2003; 12/08/2012); Carpal tunnel release (Left); Melanoma excision; Carpometacarpel (CMC) suspension plasty (Right, 02/10/2015); Ganglion cyst excision (Right, 02/10/2015); NEGATIVE SLEEP STUDY (2006 approx .  per pt); cysto with hydrodistension (N/A, 03/27/2015);  Hand surgery (Right, 01-31-15); Abdominal hysterectomy; Hemorroidectomy; Kidney stone surgery; Shoulder surgery (Left); Anterior cervical decomp/discectomy fusion (N/A, 07/16/2015); Video bronchoscopy (Bilateral, 10/20/2015); Augmentation mammaplasty (Bilateral, 1996; 2001, 2016); Breast surgery (1980's); Eye surgery (Bilateral); Colonoscopy w/ polypectomy; Shoulder hemi-arthroplasty (Left, 12/04/2015); EUS (N/A, 04/23/2016); Trigger finger release (Left, 07/06/2016); IR FLUORO GUIDE PORT INSERTION RIGHT (11/04/2016); IR US Guide Vasc Access Right (11/04/2016); and Portacath placement (Right, 2018). Family: family history includes Anesthesia problems in her mother; Cancer in her daughter, maternal aunt, and mother; Leukemia in her mother.  Constitutional Exam  General appearance: Well nourished, well developed, and well hydrated. In no apparent acute distress Vitals:   05/16/17 1147  BP: 128/85  Pulse: 88  Resp: 16  Temp: 97.6 F (36.4  C)  TempSrc: Oral  SpO2: 100%  Weight: 124 lb (56.2 kg)  Height: '5\' 3"'  (1.6 m)   BMI Assessment: Estimated body mass index is 21.97 kg/m as calculated from the following:   Height as of this encounter: '5\' 3"'  (1.6 m).   Weight as of this encounter: 124 lb (56.2 kg).  BMI interpretation table: BMI level Category Range association with higher incidence of chronic pain  <18 kg/m2 Underweight   18.5-24.9 kg/m2 Ideal body weight   25-29.9 kg/m2 Overweight Increased incidence by 20%  30-34.9 kg/m2 Obese (Class I) Increased incidence by 68%  35-39.9 kg/m2 Severe obesity (Class II) Increased incidence by 136%  >40 kg/m2 Extreme obesity (Class III) Increased incidence by 254%   BMI Readings from Last 4 Encounters:  05/16/17 21.97 kg/m  04/05/17 22.39 kg/m  12/23/16 23.03 kg/m  11/04/16 23.54 kg/m   Wt Readings from Last 4 Encounters:  05/16/17 124 lb (56.2 kg)  04/05/17 126 lb 6.4 oz (57.3 kg)  12/23/16 130 lb (59 kg)  11/04/16 135 lb (61.2 kg)  Psych/Mental status: Alert, oriented x 3 (person, place, & time)       Eyes: PERLA Respiratory: No evidence of acute respiratory distress  Cervical Spine Area Exam  Skin & Axial Inspection: No masses, redness, edema, swelling, or associated skin lesions Alignment: Symmetrical Functional ROM: Unrestricted ROM      Stability: No instability detected Muscle Tone/Strength: Functionally intact. No obvious neuro-muscular anomalies detected. Sensory (Neurological): Unimpaired Palpation: No palpable anomalies              Upper Extremity (UE) Exam    Side: Right upper extremity  Side: Left upper extremity  Skin & Extremity Inspection: Skin color, temperature, and hair growth are WNL. No peripheral edema or cyanosis. No masses, redness, swelling, asymmetry, or associated skin lesions. No contractures.  Skin & Extremity Inspection: Skin color, temperature, and hair growth are WNL. No peripheral edema or cyanosis. No masses, redness, swelling,  asymmetry, or associated skin lesions. No contractures.  Functional ROM: Unrestricted ROM          Functional ROM: Unrestricted ROM          Muscle Tone/Strength: Functionally intact. No obvious neuro-muscular anomalies detected.  Muscle Tone/Strength: Functionally intact. No obvious neuro-muscular anomalies detected.  Sensory (Neurological): Unimpaired          Sensory (Neurological): Unimpaired          Palpation: No palpable anomalies              Palpation: No palpable anomalies              Specialized Test(s): Deferred         Specialized Test(s): Deferred  Thoracic Spine Area Exam  Skin & Axial Inspection: No masses, redness, or swelling Alignment: Symmetrical Functional ROM: Unrestricted ROM Stability: No instability detected Muscle Tone/Strength: Functionally intact. No obvious neuro-muscular anomalies detected. Sensory (Neurological): Unimpaired Muscle strength & Tone: No palpable anomalies  Lumbar Spine Area Exam  Skin & Axial Inspection: No masses, redness, or swelling Alignment: Symmetrical Functional ROM: Unrestricted ROM      Stability: No instability detected Muscle Tone/Strength: Functionally intact. No obvious neuro-muscular anomalies detected. Sensory (Neurological): Unimpaired Palpation: No palpable anomalies       Provocative Tests: Lumbar Hyperextension and rotation test: evaluation deferred today       Lumbar Lateral bending test: evaluation deferred today       Patrick's Maneuver: evaluation deferred today                    Gait & Posture Assessment  Ambulation: Patient ambulates using a cane Gait: Very limited, using assistive device to ambulate Posture: Difficulty standing up straight, due to pain   Lower Extremity Exam    Side: Right lower extremity  Side: Left lower extremity  Skin & Extremity Inspection: Evidence of prior arthroplastic surgery  Skin & Extremity Inspection: Skin color, temperature, and hair growth are WNL. No peripheral edema  or cyanosis. No masses, redness, swelling, asymmetry, or associated skin lesions. No contractures.  Functional ROM: Decreased ROM for knee joint  Functional ROM: Unrestricted ROM          Muscle Tone/Strength: Guarding  Muscle Tone/Strength: Functionally intact. No obvious neuro-muscular anomalies detected.  Sensory (Neurological): Allodynia (Painful response to non-painful stimuli)  Sensory (Neurological): Unimpaired  Palpation: Complains of area being tender to palpation  Palpation: No palpable anomalies   Assessment  Primary Diagnosis & Pertinent Problem List: The primary encounter diagnosis was Chronic knee pain (Secondary Area of Pain) (Right). 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  Status Diagnosis  Reoccurring Reoccurring Recurring 1. Chronic knee pain (Secondary Area of Pain) (Right)   2. 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   Chronic Pain Syndrome  Chronic shoulder pain (Left)  Chronic knee pain (Secondary Area of Pain) (Right)   Good response to diagnostic right-sided genicular nerve block  on 06/05/2014, followed by a right knee genicular radiofrequency ablation on 07/02/2014.   Chronic lower extremity pain (Primary Area of Pain) (Right)  Failed Total Knee, Left ([facility])  Current Smoker  Medication Monitoring Encounter  Nasal Septal Perforation  Chronic, Continuous Use of Opioids  Memory Deficits  Port-A-Cath in Place  History of Bisphosphonate Therapy  Venereal Wart  Vaginal Mass  Nasal Ulcer  Hemoptysis  Cough  Cervical Lymphadenitis  Buzzing in  Ear  Ear Pain, Referred  Idiopathic Osteoporosis  History of Migraine Headaches  Difficulty Hearing  H/O Respiratory System Disease  Deflected Nasal Septum  Chronic Otitis Externa  Abnormal Auditory Perception  Hearing Loss  Asthma   Overview:  Last Assessment & Plan:   Depo-Medrol 80 mg was administered in the office.  A sample and prescription have been provided for Dulera 200/5 g, 2 inhalations twice a day.  To maximize pulmonary deposition, a spacer has been provided along with instructions for its proper administration with an HFA inhaler.   A prescription has been provided for tessilon pearls 200 mg every 6 hours as needed.  A portable nebulizer has been provided.   The patient has been asked to contact me if her symptoms persist, progress, or if she becomes febrile. Otherwise, she may return for follow up in 4 months.   Generalized Anxiety Disorder  Moderate Persistent Asthma  Allergic Rhinitis  Hypothyroidism  Irritable Bowel Syndrome With Constipation   Plan of Care  Pharmacotherapy (Medications Ordered): No orders of the defined types were placed in this encounter.  Medications administered today: Keymoni Mccaster. [place] had no medications administered during this visit.   Procedure Orders     Radiofrequency,Genicular     Radiofrequency, sympathectomy     Radiofrequency, sympathectomy Lab Orders  No laboratory test(s) ordered today   Imaging Orders  No imaging studies ordered today    Referral Orders  No referral(s) requested today    Interventional management options: Planned, scheduled, and/or pending:   Palliative right-sided L3 and L4 lumbar sympathetic RFA under fluoroscopic guidance and IV sedation (no steroids).   Considering:   Palliative right-sided genicular nerve RFA  Palliative right-sided L3 and L4 lumbar sympathetic RFA    Palliative PRN treatment(s):   Palliative right-sided genicular nerve RFA  Palliative right-sided L3 and L4 lumbar sympathetic RFA    Provider-requested follow-up: Return for RFA (fluoro + sedation): (R) LSB RFA (NO STEROIDS).  No future appointments. Primary Care Physician: Thressa Sheller, MD Location: [facility] Note by: Gaspar Cola, MD Date: 05/16/2017; Time: 1:02 PM

## 2017-05-16 NOTE — Patient Instructions (Addendum)
____________________________________________________________________________________________  Pain Scale  Introduction: The pain score used by this practice is the Verbal Numerical Rating Scale (VNRS-11). This is an 11-point scale. It is for adults and children 10 years or older. There are significant differences in how the pain score is reported, used, and applied. Forget everything you learned in the past and learn this scoring system.  General Information: The scale should reflect your current level of pain. Unless you are specifically asked for the level of your worst pain, or your average pain. If you are asked for one of these two, then it should be understood that it is over the past 24 hours.  Basic Activities of Daily Living (ADL): Personal hygiene, dressing, eating, transferring, and using restroom.  Instructions: Most patients tend to report their level of pain as a combination of two factors, their physical pain and their psychosocial pain. This last one is also known as "suffering" and it is reflection of how physical pain affects you socially and psychologically. From now on, report them separately. From this point on, when asked to report your pain level, report only your physical pain. Use the following table for reference.  Pain Clinic Pain Levels (0-5/10)  Pain Level Score  Description  No Pain 0   Mild pain 1 Nagging, annoying, but does not interfere with basic activities of daily living (ADL). Patients are able to eat, bathe, get dressed, toileting (being able to get on and off the toilet and perform personal hygiene functions), transfer (move in and out of bed or a chair without assistance), and maintain continence (able to control bladder and bowel functions). Blood pressure and heart rate are unaffected. A normal heart rate for a healthy adult ranges from 60 to 100 bpm (beats per minute).   Mild to moderate pain 2 Noticeable and distracting. Impossible to hide from other  people. More frequent flare-ups. Still possible to adapt and function close to normal. It can be very annoying and may have occasional stronger flare-ups. With discipline, patients may get used to it and adapt.   Moderate pain 3 Interferes significantly with activities of daily living (ADL). It becomes difficult to feed, bathe, get dressed, get on and off the toilet or to perform personal hygiene functions. Difficult to get in and out of bed or a chair without assistance. Very distracting. With effort, it can be ignored when deeply involved in activities.   Moderately severe pain 4 Impossible to ignore for more than a few minutes. With effort, patients may still be able to manage work or participate in some social activities. Very difficult to concentrate. Signs of autonomic nervous system discharge are evident: dilated pupils (mydriasis); mild sweating (diaphoresis); sleep interference. Heart rate becomes elevated (>115 bpm). Diastolic blood pressure (lower number) rises above 100 mmHg. Patients find relief in laying down and not moving.   Severe pain 5 Intense and extremely unpleasant. Associated with frowning face and frequent crying. Pain overwhelms the senses.  Ability to do any activity or maintain social relationships becomes significantly limited. Conversation becomes difficult. Pacing back and forth is common, as getting into a comfortable position is nearly impossible. Pain wakes you up from deep sleep. Physical signs will be obvious: pupillary dilation; increased sweating; goosebumps; brisk reflexes; cold, clammy hands and feet; nausea, vomiting or dry heaves; loss of appetite; significant sleep disturbance with inability to fall asleep or to remain asleep. When persistent, significant weight loss is observed due to the complete loss of appetite and sleep deprivation.  Blood   pressure and heart rate becomes significantly elevated. Caution: If elevated blood pressure triggers a pounding headache  associated with blurred vision, then the patient should immediately seek attention at an urgent or emergency care unit, as these may be signs of an impending stroke.    Emergency Department Pain Levels (6-10/10)  Emergency Room Pain 6 Severely limiting. Requires emergency care and should not be seen or managed at an outpatient pain management facility. Communication becomes difficult and requires great effort. Assistance to reach the emergency department may be required. Facial flushing and profuse sweating along with potentially dangerous increases in heart rate and blood pressure will be evident.   Distressing pain 7 Self-care is very difficult. Assistance is required to transport, or use restroom. Assistance to reach the emergency department will be required. Tasks requiring coordination, such as bathing and getting dressed become very difficult.   Disabling pain 8 Self-care is no longer possible. At this level, pain is disabling. The individual is unable to do even the most "basic" activities such as walking, eating, bathing, dressing, transferring to a bed, or toileting. Fine motor skills are lost. It is difficult to think clearly.   Incapacitating pain 9 Pain becomes incapacitating. Thought processing is no longer possible. Difficult to remember your own name. Control of movement and coordination are lost.   The worst pain imaginable 10 At this level, most patients pass out from pain. When this level is reached, collapse of the autonomic nervous system occurs, leading to a sudden drop in blood pressure and heart rate. This in turn results in a temporary and dramatic drop in blood flow to the brain, leading to a loss of consciousness. Fainting is one of the body's self defense mechanisms. Passing out puts the brain in a calmed state and causes it to shut down for a while, in order to begin the healing process.    Summary: 1. Refer to this scale when providing us with your pain level. 2. Be  accurate and careful when reporting your pain level. This will help with your care. 3. Over-reporting your pain level will lead to loss of credibility. 4. Even a level of 1/10 means that there is pain and will be treated at our facility. 5. High, inaccurate reporting will be documented as "Symptom Exaggeration", leading to loss of credibility and suspicions of possible secondary gains such as obtaining more narcotics, or wanting to appear disabled, for fraudulent reasons. 6. Only pain levels of 5 or below will be seen at our facility. 7. Pain levels of 6 and above will be sent to the Emergency Department and the appointment cancelled. ____________________________________________________________________________________________   ____________________________________________________________________________________________  Preparing for Procedure with Sedation  Instructions: . Oral Intake: Do not eat or drink anything for at least 8 hours prior to your procedure. . Transportation: Public transportation is not allowed. Bring an adult driver. The driver must be physically present in our waiting room before any procedure can be started. . Physical Assistance: Bring an adult physically capable of assisting you, in the event you need help. This adult should keep you company at home for at least 6 hours after the procedure. . Blood Pressure Medicine: Take your blood pressure medicine with a sip of water the morning of the procedure. . Blood thinners:  . Diabetics on insulin: Notify the staff so that you can be scheduled 1st case in the morning. If your diabetes requires high dose insulin, take only  of your normal insulin dose the morning of the procedure and   notify the staff that you have done so. . Preventing infections: Shower with an antibacterial soap the morning of your procedure. . Build-up your immune system: Take 1000 mg of Vitamin C with every meal (3 times a day) the day prior to your  procedure. Marland Kitchen Antibiotics: Inform the staff if you have a condition or reason that requires you to take antibiotics before dental procedures. . Pregnancy: If you are pregnant, call and cancel the procedure. . Sickness: If you have a cold, fever, or any active infections, call and cancel the procedure. . Arrival: You must be in the facility at least 30 minutes prior to your scheduled procedure. . Children: Do not bring children with you. . Dress appropriately: Bring dark clothing that you would not mind if they get stained. . Valuables: Do not bring any jewelry or valuables.  Procedure appointments are reserved for interventional treatments only. Marland Kitchen No Prescription Refills. . No medication changes will be discussed during procedure appointments. . No disability issues will be discussed.  Remember:  Regular Business hours are:  Monday to Thursday 8:00 AM to 4:00 PM  Provider's Schedule: Milinda Pointer, MD:  Procedure days: Tuesday and Thursday 7:30 AM to 4:00 PM  Gillis Santa, MD:  Procedure days: Monday and Wednesday 7:30 AM to 4:00 PM ____________________________________________________________________________________________   GENERAL RISKS AND COMPLICATIONS  What are the risk, side effects and possible complications? Generally speaking, most procedures are safe.  However, with any procedure there are risks, side effects, and the possibility of complications.  The risks and complications are dependent upon the sites that are lesioned, or the type of nerve block to be performed.  The closer the procedure is to the spine, the more serious the risks are.  Great care is taken when placing the radio frequency needles, block needles or lesioning probes, but sometimes complications can occur. 1. Infection: Any time there is an injection through the skin, there is a risk of infection.  This is why sterile conditions are used for these blocks.  There are four possible types of  infection. 1. Localized skin infection. 2. Central Nervous System Infection-This can be in the form of Meningitis, which can be deadly. 3. Epidural Infections-This can be in the form of an epidural abscess, which can cause pressure inside of the spine, causing compression of the spinal cord with subsequent paralysis. This would require an emergency surgery to decompress, and there are no guarantees that the patient would recover from the paralysis. 4. Discitis-This is an infection of the intervertebral discs.  It occurs in about 1% of discography procedures.  It is difficult to treat and it may lead to surgery.        2. Pain: the needles have to go through skin and soft tissues, will cause soreness.       3. Damage to internal structures:  The nerves to be lesioned may be near blood vessels or    other nerves which can be potentially damaged.       4. Bleeding: Bleeding is more common if the patient is taking blood thinners such as  aspirin, Coumadin, Ticiid, Plavix, etc., or if he/she have some genetic predisposition  such as hemophilia. Bleeding into the spinal canal can cause compression of the spinal  cord with subsequent paralysis.  This would require an emergency surgery to  decompress and there are no guarantees that the patient would recover from the  paralysis.       5. Pneumothorax:  Puncturing of a  lung is a possibility, every time a needle is introduced in  the area of the chest or upper back.  Pneumothorax refers to free air around the  collapsed lung(s), inside of the thoracic cavity (chest cavity).  Another two possible  complications related to a similar event would include: Hemothorax and Chylothorax.   These are variations of the Pneumothorax, where instead of air around the collapsed  lung(s), you may have blood or chyle, respectively.       6. Spinal headaches: They may occur with any procedures in the area of the spine.       7. Persistent CSF (Cerebro-Spinal Fluid) leakage: This is  a rare problem, but may occur  with prolonged intrathecal or epidural catheters either due to the formation of a fistulous  track or a dural tear.       8. Nerve damage: By working so close to the spinal cord, there is always a possibility of  nerve damage, which could be as serious as a permanent spinal cord injury with  paralysis.       9. Death:  Although rare, severe deadly allergic reactions known as "Anaphylactic  reaction" can occur to any of the medications used.      10. Worsening of the symptoms:  We can always make thing worse.  What are the chances of something like this happening? Chances of any of this occuring are extremely low.  By statistics, you have more of a chance of getting killed in a motor vehicle accident: while driving to the hospital than any of the above occurring .  Nevertheless, you should be aware that they are possibilities.  In general, it is similar to taking a shower.  Everybody knows that you can slip, hit your head and get killed.  Does that mean that you should not shower again?  Nevertheless always keep in mind that statistics do not mean anything if you happen to be on the wrong side of them.  Even if a procedure has a 1 (one) in a 1,000,000 (million) chance of going wrong, it you happen to be that one..Also, keep in mind that by statistics, you have more of a chance of having something go wrong when taking medications.  Who should not have this procedure? If you are on a blood thinning medication (e.g. Coumadin, Plavix, see list of "Blood Thinners"), or if you have an active infection going on, you should not have the procedure.  If you are taking any blood thinners, please inform your physician.  How should I prepare for this procedure?  Do not eat or drink anything at least six hours prior to the procedure.  Bring a driver with you .  It cannot be a taxi.  Come accompanied by an adult that can drive you back, and that is strong enough to help you if your  legs get weak or numb from the local anesthetic.  Take all of your medicines the morning of the procedure with just enough water to swallow them.  If you have diabetes, make sure that you are scheduled to have your procedure done first thing in the morning, whenever possible.  If you have diabetes, take only half of your insulin dose and notify our nurse that you have done so as soon as you arrive at the clinic.  If you are diabetic, but only take blood sugar pills (oral hypoglycemic), then do not take them on the morning of your procedure.  You may take them  after you have had the procedure.  Do not take aspirin or any aspirin-containing medications, at least eleven (11) days prior to the procedure.  They may prolong bleeding.  Wear loose fitting clothing that may be easy to take off and that you would not mind if it got stained with Betadine or blood.  Do not wear any jewelry or perfume  Remove any nail coloring.  It will interfere with some of our monitoring equipment.  NOTE: Remember that this is not meant to be interpreted as a complete list of all possible complications.  Unforeseen problems may occur.  BLOOD THINNERS The following drugs contain aspirin or other products, which can cause increased bleeding during surgery and should not be taken for 2 weeks prior to and 1 week after surgery.  If you should need take something for relief of minor pain, you may take acetaminophen which is found in Tylenol,m Datril, Anacin-3 and Panadol. It is not blood thinner. The products listed below are.  Do not take any of the products listed below in addition to any listed on your instruction sheet.  A.P.C or A.P.C with Codeine Codeine Phosphate Capsules #3 Ibuprofen Ridaura  ABC compound Congesprin Imuran rimadil  Advil Cope Indocin Robaxisal  Alka-Seltzer Effervescent Pain Reliever and Antacid Coricidin or Coricidin-D  Indomethacin Rufen  Alka-Seltzer plus Cold Medicine Cosprin Ketoprofen S-A-C  Tablets  Anacin Analgesic Tablets or Capsules Coumadin Korlgesic Salflex  Anacin Extra Strength Analgesic tablets or capsules CP-2 Tablets Lanoril Salicylate  Anaprox Cuprimine Capsules Levenox Salocol  Anexsia-D Dalteparin Magan Salsalate  Anodynos Darvon compound Magnesium Salicylate Sine-off  Ansaid Dasin Capsules Magsal Sodium Salicylate  Anturane Depen Capsules Marnal Soma  APF Arthritis pain formula Dewitt's Pills Measurin Stanback  Argesic Dia-Gesic Meclofenamic Sulfinpyrazone  Arthritis Bayer Timed Release Aspirin Diclofenac Meclomen Sulindac  Arthritis pain formula Anacin Dicumarol Medipren Supac  Analgesic (Safety coated) Arthralgen Diffunasal Mefanamic Suprofen  Arthritis Strength Bufferin Dihydrocodeine Mepro Compound Suprol  Arthropan liquid Dopirydamole Methcarbomol with Aspirin Synalgos  ASA tablets/Enseals Disalcid Micrainin Tagament  Ascriptin Doan's Midol Talwin  Ascriptin A/D Dolene Mobidin Tanderil  Ascriptin Extra Strength Dolobid Moblgesic Ticlid  Ascriptin with Codeine Doloprin or Doloprin with Codeine Momentum Tolectin  Asperbuf Duoprin Mono-gesic Trendar  Aspergum Duradyne Motrin or Motrin IB Triminicin  Aspirin plain, buffered or enteric coated Durasal Myochrisine Trigesic  Aspirin Suppositories Easprin Nalfon Trillsate  Aspirin with Codeine Ecotrin Regular or Extra Strength Naprosyn Uracel  Atromid-S Efficin Naproxen Ursinus  Auranofin Capsules Elmiron Neocylate Vanquish  Axotal Emagrin Norgesic Verin  Azathioprine Empirin or Empirin with Codeine Normiflo Vitamin E  Azolid Emprazil Nuprin Voltaren  Bayer Aspirin plain, buffered or children's or timed BC Tablets or powders Encaprin Orgaran Warfarin Sodium  Buff-a-Comp Enoxaparin Orudis Zorpin  Buff-a-Comp with Codeine Equegesic Os-Cal-Gesic   Buffaprin Excedrin plain, buffered or Extra Strength Oxalid   Bufferin Arthritis Strength Feldene Oxphenbutazone   Bufferin plain or Extra Strength Feldene Capsules  Oxycodone with Aspirin   Bufferin with Codeine Fenoprofen Fenoprofen Pabalate or Pabalate-SF   Buffets II Flogesic Panagesic   Buffinol plain or Extra Strength Florinal or Florinal with Codeine Panwarfarin   Buf-Tabs Flurbiprofen Penicillamine   Butalbital Compound Four-way cold tablets Penicillin   Butazolidin Fragmin Pepto-Bismol   Carbenicillin Geminisyn Percodan   Carna Arthritis Reliever Geopen Persantine   Carprofen Gold's salt Persistin   Chloramphenicol Goody's Phenylbutazone   Chloromycetin Haltrain Piroxlcam   Clmetidine heparin Plaquenil   Cllnoril Hyco-pap Ponstel   Clofibrate Hydroxy chloroquine Propoxyphen  Before stopping any of these medications, be sure to consult the physician who ordered them.  Some, such as Coumadin (Warfarin) are ordered to prevent or treat serious conditions such as "deep thrombosis", "pumonary embolisms", and other heart problems.  The amount of time that you may need off of the medication may also vary with the medication and the reason for which you were taking it.  If you are taking any of these medications, please make sure you notify your pain physician before you undergo any procedures.         Radiofrequency Lesioning Radiofrequency lesioning is a procedure that is performed to relieve pain. The procedure is often used for back, neck, or arm pain. Radiofrequency lesioning involves the use of a machine that creates radio waves to make heat. During the procedure, the heat is applied to the nerve that carries the pain signal. The heat damages the nerve and interferes with the pain signal. Pain relief usually starts about 2 weeks after the procedure and lasts for 6 months to 1 year. Tell a health care provider about:  Any allergies you have.  All medicines you are taking, including vitamins, herbs, eye drops, creams, and over-the-counter medicines.  Any problems you or family members have had with anesthetic medicines.  Any blood  disorders you have.  Any surgeries you have had.  Any medical conditions you have.  Whether you are pregnant or may be pregnant. What are the risks? Generally, this is a safe procedure. However, problems may occur, including:  Pain or soreness at the injection site.  Infection at the injection site.  Damage to nerves or blood vessels.  What happens before the procedure?  Ask your health care provider about: ? Changing or stopping your regular medicines. This is especially important if you are taking diabetes medicines or blood thinners. ? Taking medicines such as aspirin and ibuprofen. These medicines can thin your blood. Do not take these medicines before your procedure if your health care provider instructs you not to.  Follow instructions from your health care provider about eating or drinking restrictions.  Plan to have someone take you home after the procedure.  If you go home right after the procedure, plan to have someone with you for 24 hours. What happens during the procedure?  You will be given one or more of the following: ? A medicine to help you relax (sedative). ? A medicine to numb the area (local anesthetic).  You will be awake during the procedure. You will need to be able to talk with the health care provider during the procedure.  With the help of a type of X-ray (fluoroscopy), the health care provider will insert a radiofrequency needle into the area to be treated.  Next, a wire that carries the radio waves (electrode) will be put through the radiofrequency needle. An electrical pulse will be sent through the electrode to verify the correct nerve. You will feel a tingling sensation, and you may have muscle twitching.  Then, the tissue that is around the needle tip will be heated by an electric current that is passed using the radiofrequency machine. This will numb the nerves.  A bandage (dressing) will be put on the insertion area after the procedure is  done. The procedure may vary among health care providers and hospitals. What happens after the procedure?  Your blood pressure, heart rate, breathing rate, and blood oxygen level will be monitored often until the medicines you were given have worn off.  Return to your normal activities as directed by your health care provider. This information is not intended to replace advice given to you by your health care provider. Make sure you discuss any questions you have with your health care provider. Document Released: 10/07/2010 Document Revised: 07/17/2015 Document Reviewed: 03/18/2014 Elsevier Interactive Patient Education  [employer].

## 2017-05-23 DIAGNOSIS — M5412 Radiculopathy, cervical region: Secondary | ICD-10-CM | POA: Diagnosis not present

## 2017-05-31 DIAGNOSIS — M542 Cervicalgia: Secondary | ICD-10-CM | POA: Diagnosis not present

## 2017-06-03 DIAGNOSIS — M542 Cervicalgia: Secondary | ICD-10-CM | POA: Diagnosis not present

## 2017-06-20 DIAGNOSIS — G4700 Insomnia, unspecified: Secondary | ICD-10-CM | POA: Diagnosis not present

## 2017-06-20 DIAGNOSIS — E538 Deficiency of other specified B group vitamins: Secondary | ICD-10-CM | POA: Diagnosis not present

## 2017-06-20 DIAGNOSIS — M79604 Pain in right leg: Secondary | ICD-10-CM | POA: Diagnosis not present

## 2017-06-20 DIAGNOSIS — N390 Urinary tract infection, site not specified: Secondary | ICD-10-CM | POA: Diagnosis not present

## 2017-06-20 DIAGNOSIS — R300 Dysuria: Secondary | ICD-10-CM | POA: Diagnosis not present

## 2017-06-21 DIAGNOSIS — R300 Dysuria: Secondary | ICD-10-CM | POA: Diagnosis not present

## 2017-06-23 ENCOUNTER — Encounter (HOSPITAL_BASED_OUTPATIENT_CLINIC_OR_DEPARTMENT_OTHER): Payer: Self-pay | Admitting: *Deleted

## 2017-06-23 DIAGNOSIS — E039 Hypothyroidism, unspecified: Secondary | ICD-10-CM | POA: Diagnosis not present

## 2017-06-23 DIAGNOSIS — M810 Age-related osteoporosis without current pathological fracture: Secondary | ICD-10-CM | POA: Diagnosis not present

## 2017-06-23 DIAGNOSIS — M25519 Pain in unspecified shoulder: Secondary | ICD-10-CM | POA: Diagnosis not present

## 2017-06-23 DIAGNOSIS — M8700 Idiopathic aseptic necrosis of unspecified bone: Secondary | ICD-10-CM | POA: Diagnosis not present

## 2017-06-27 ENCOUNTER — Other Ambulatory Visit: Payer: Self-pay | Admitting: Urology

## 2017-06-28 ENCOUNTER — Other Ambulatory Visit: Payer: Self-pay

## 2017-06-28 ENCOUNTER — Encounter (HOSPITAL_BASED_OUTPATIENT_CLINIC_OR_DEPARTMENT_OTHER): Payer: Self-pay

## 2017-06-28 NOTE — Progress Notes (Signed)
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

## 2017-06-28 NOTE — Progress Notes (Signed)
Spoke with:  Kieth Brightly NPO: No food after midnight/Clear liquids until 8:30AM DOS Arrival time: 1230PM Labs:  N/A AM medications: Xanax, Nasal Spray, Gabapentin, Tizanidine Pre op orders: Needs second sign Ride home:  Dellis Filbert (husband) [phone]

## 2017-06-29 ENCOUNTER — Ambulatory Visit (HOSPITAL_BASED_OUTPATIENT_CLINIC_OR_DEPARTMENT_OTHER): Payer: BLUE CROSS/BLUE SHIELD | Admitting: Anesthesiology

## 2017-06-29 ENCOUNTER — Other Ambulatory Visit: Payer: Self-pay

## 2017-06-29 ENCOUNTER — Encounter (HOSPITAL_BASED_OUTPATIENT_CLINIC_OR_DEPARTMENT_OTHER): Payer: Self-pay | Admitting: Anesthesiology

## 2017-06-29 ENCOUNTER — Encounter (HOSPITAL_BASED_OUTPATIENT_CLINIC_OR_DEPARTMENT_OTHER)
Admission: RE | Disposition: A | Discharge status: Home / Self Care | Payer: Self-pay | Source: Ambulatory Visit | Attending: Urology

## 2017-06-29 ENCOUNTER — Ambulatory Visit (HOSPITAL_BASED_OUTPATIENT_CLINIC_OR_DEPARTMENT_OTHER)
Admission: RE | Admit: 2017-06-29 | Discharge: 2017-06-29 | Disposition: A | Discharge status: Home / Self Care | Payer: BLUE CROSS/BLUE SHIELD | Source: Ambulatory Visit | Attending: Urology | Admitting: Urology

## 2017-06-29 DIAGNOSIS — E039 Hypothyroidism, unspecified: Secondary | ICD-10-CM | POA: Insufficient documentation

## 2017-06-29 DIAGNOSIS — Z8249 Family history of ischemic heart disease and other diseases of the circulatory system: Secondary | ICD-10-CM | POA: Diagnosis not present

## 2017-06-29 DIAGNOSIS — F419 Anxiety disorder, unspecified: Secondary | ICD-10-CM | POA: Insufficient documentation

## 2017-06-29 DIAGNOSIS — E7800 Pure hypercholesterolemia, unspecified: Secondary | ICD-10-CM | POA: Insufficient documentation

## 2017-06-29 DIAGNOSIS — R3982 Chronic bladder pain: Secondary | ICD-10-CM | POA: Diagnosis not present

## 2017-06-29 DIAGNOSIS — Z87891 Personal history of nicotine dependence: Secondary | ICD-10-CM | POA: Diagnosis not present

## 2017-06-29 DIAGNOSIS — Z888 Allergy status to other drugs, medicaments and biological substances status: Secondary | ICD-10-CM | POA: Insufficient documentation

## 2017-06-29 DIAGNOSIS — Z882 Allergy status to sulfonamides status: Secondary | ICD-10-CM | POA: Insufficient documentation

## 2017-06-29 DIAGNOSIS — Z79899 Other long term (current) drug therapy: Secondary | ICD-10-CM | POA: Diagnosis not present

## 2017-06-29 DIAGNOSIS — N3289 Other specified disorders of bladder: Secondary | ICD-10-CM | POA: Insufficient documentation

## 2017-06-29 DIAGNOSIS — R351 Nocturia: Secondary | ICD-10-CM | POA: Diagnosis not present

## 2017-06-29 DIAGNOSIS — Z91040 Latex allergy status: Secondary | ICD-10-CM | POA: Insufficient documentation

## 2017-06-29 DIAGNOSIS — R300 Dysuria: Secondary | ICD-10-CM | POA: Diagnosis not present

## 2017-06-29 DIAGNOSIS — M1990 Unspecified osteoarthritis, unspecified site: Secondary | ICD-10-CM | POA: Diagnosis not present

## 2017-06-29 DIAGNOSIS — G894 Chronic pain syndrome: Secondary | ICD-10-CM | POA: Diagnosis not present

## 2017-06-29 DIAGNOSIS — M545 Low back pain: Secondary | ICD-10-CM | POA: Diagnosis not present

## 2017-06-29 DIAGNOSIS — J45909 Unspecified asthma, uncomplicated: Secondary | ICD-10-CM | POA: Insufficient documentation

## 2017-06-29 DIAGNOSIS — N3010 Interstitial cystitis (chronic) without hematuria: Secondary | ICD-10-CM | POA: Insufficient documentation

## 2017-06-29 DIAGNOSIS — Z885 Allergy status to narcotic agent status: Secondary | ICD-10-CM | POA: Insufficient documentation

## 2017-06-29 DIAGNOSIS — M25561 Pain in right knee: Secondary | ICD-10-CM | POA: Diagnosis not present

## 2017-06-29 HISTORY — DX: Personal history of other malignant neoplasm of skin: Z85.828

## 2017-06-29 HISTORY — PX: CYSTOSCOPY WITH HYDRODISTENSION AND BIOPSY: SHX5127

## 2017-06-29 HISTORY — DX: Postprocedural hypothyroidism: E89.0

## 2017-06-29 HISTORY — DX: Basal cell carcinoma of skin, unspecified: C44.91

## 2017-06-29 HISTORY — DX: Chronic migraine without aura, intractable, with status migrainosus: G43.711

## 2017-06-29 HISTORY — DX: Personal history of other medical treatment: Z92.89

## 2017-06-29 HISTORY — DX: Anemia, unspecified: D64.9

## 2017-06-29 HISTORY — DX: Malignant melanoma of skin, unspecified: C43.9

## 2017-06-29 HISTORY — DX: Reserved for concepts with insufficient information to code with codable children: IMO0002

## 2017-06-29 HISTORY — DX: Presence of other vascular implants and grafts: Z95.828

## 2017-06-29 SURGERY — CYSTOSCOPY, WITH BLADDER HYDRODISTENSION AND BIOPSY
Anesthesia: General | Site: Bladder

## 2017-06-29 MED ORDER — MIDAZOLAM HCL 5 MG/5ML IJ SOLN
INTRAMUSCULAR | Status: DC | PRN
  Administered 2017-06-29: 2 mg via INTRAVENOUS

## 2017-06-29 MED ORDER — FENTANYL CITRATE (PF) 100 MCG/2ML IJ SOLN
INTRAMUSCULAR | Status: DC | PRN
  Administered 2017-06-29: 25 ug via INTRAVENOUS
  Administered 2017-06-29: 50 ug via INTRAVENOUS
  Administered 2017-06-29: 25 ug via INTRAVENOUS

## 2017-06-29 MED ORDER — DIPHENHYDRAMINE HCL 50 MG/ML IJ SOLN
6.2500 mg | Freq: Once | INTRAMUSCULAR | Status: AC
  Administered 2017-06-29: 6.5 mg via INTRAVENOUS
  Filled 2017-06-29: qty 0.13

## 2017-06-29 MED ORDER — ACETAMINOPHEN 10 MG/ML IV SOLN
INTRAVENOUS | Status: AC
  Filled 2017-06-29: qty 100

## 2017-06-29 MED ORDER — PROMETHAZINE HCL 25 MG/ML IJ SOLN
INTRAMUSCULAR | Status: AC
  Filled 2017-06-29: qty 1

## 2017-06-29 MED ORDER — PROPOFOL 10 MG/ML IV BOLUS
INTRAVENOUS | Status: DC | PRN
  Administered 2017-06-29: 200 mg via INTRAVENOUS
  Administered 2017-06-29: 20 mg via INTRAVENOUS

## 2017-06-29 MED ORDER — CEFAZOLIN SODIUM-DEXTROSE 2-4 GM/100ML-% IV SOLN
INTRAVENOUS | Status: AC
  Filled 2017-06-29: qty 100

## 2017-06-29 MED ORDER — LIDOCAINE 2% (20 MG/ML) 5 ML SYRINGE
INTRAMUSCULAR | Status: DC | PRN
  Administered 2017-06-29: 100 mg via INTRAVENOUS

## 2017-06-29 MED ORDER — SODIUM CHLORIDE 0.9 % IJ SOLN
INTRAMUSCULAR | Status: AC
  Filled 2017-06-29: qty 10

## 2017-06-29 MED ORDER — ACETAMINOPHEN 10 MG/ML IV SOLN
1000.0000 mg | Freq: Four times a day (QID) | INTRAVENOUS | Status: DC
  Administered 2017-06-29: 1000 mg via INTRAVENOUS
  Filled 2017-06-29: qty 100

## 2017-06-29 MED ORDER — LACTATED RINGERS IV SOLN
INTRAVENOUS | Status: DC
  Administered 2017-06-29 (×2): via INTRAVENOUS
  Filled 2017-06-29: qty 1000

## 2017-06-29 MED ORDER — BUPIVACAINE HCL (PF) 0.5 % IJ SOLN
INTRAMUSCULAR | Status: AC
  Filled 2017-06-29: qty 30

## 2017-06-29 MED ORDER — BELLADONNA ALKALOIDS-OPIUM 16.2-60 MG RE SUPP
RECTAL | Status: DC | PRN
  Administered 2017-06-29: 1 via RECTAL

## 2017-06-29 MED ORDER — DIPHENHYDRAMINE HCL 50 MG/ML IJ SOLN
INTRAMUSCULAR | Status: AC
Start: 2017-06-29 — End: ?
  Filled 2017-06-29: qty 1

## 2017-06-29 MED ORDER — DIPHENHYDRAMINE HCL 50 MG/ML IJ SOLN
INTRAMUSCULAR | Status: AC
  Filled 2017-06-29: qty 1

## 2017-06-29 MED ORDER — PHENAZOPYRIDINE HCL 200 MG PO TABS
ORAL | Status: DC | PRN
  Administered 2017-06-29: 15 mL via INTRAVESICAL

## 2017-06-29 MED ORDER — FENTANYL CITRATE (PF) 100 MCG/2ML IJ SOLN
25.0000 ug | INTRAMUSCULAR | Status: DC | PRN
  Filled 2017-06-29: qty 1

## 2017-06-29 MED ORDER — BELLADONNA ALKALOIDS-OPIUM 16.2-60 MG RE SUPP
RECTAL | Status: AC
  Filled 2017-06-29: qty 1

## 2017-06-29 MED ORDER — ARTIFICIAL TEARS OPHTHALMIC OINT
TOPICAL_OINTMENT | OPHTHALMIC | Status: AC
  Filled 2017-06-29: qty 3.5

## 2017-06-29 MED ORDER — PROMETHAZINE HCL 25 MG/ML IJ SOLN
6.2500 mg | Freq: Once | INTRAMUSCULAR | Status: AC
Start: 2017-06-29 — End: 2017-06-29
  Administered 2017-06-29: 6.25 mg via INTRAVENOUS
  Filled 2017-06-29: qty 1

## 2017-06-29 MED ORDER — CEFAZOLIN SODIUM-DEXTROSE 2-4 GM/100ML-% IV SOLN
2.0000 g | INTRAVENOUS | Status: AC
  Administered 2017-06-29: 2 g via INTRAVENOUS
  Filled 2017-06-29: qty 100

## 2017-06-29 MED ORDER — MIDAZOLAM HCL 2 MG/2ML IJ SOLN
INTRAMUSCULAR | Status: AC
  Filled 2017-06-29: qty 2

## 2017-06-29 MED ORDER — FENTANYL CITRATE (PF) 100 MCG/2ML IJ SOLN
INTRAMUSCULAR | Status: AC
  Filled 2017-06-29: qty 2

## 2017-06-29 MED ORDER — HEPARIN SOD (PORK) LOCK FLUSH 100 UNIT/ML IV SOLN
500.0000 [IU] | INTRAVENOUS | Status: AC | PRN
  Administered 2017-06-29: 500 [IU]

## 2017-06-29 MED ORDER — PROPOFOL 10 MG/ML IV BOLUS
INTRAVENOUS | Status: AC
  Filled 2017-06-29: qty 40

## 2017-06-29 MED ORDER — PROMETHAZINE HCL 25 MG/ML IJ SOLN
INTRAMUSCULAR | Status: AC
Start: 2017-06-29 — End: ?
  Filled 2017-06-29: qty 1

## 2017-06-29 SURGICAL SUPPLY — 17 items
BAG DRAIN URO-CYSTO SKYTR STRL (DRAIN) ×2 IMPLANT
CATH ROBINSON RED A/P 14FR (CATHETERS) IMPLANT
CATH ROBINSON RED A/P 16FR (CATHETERS) IMPLANT
CATH SILICONE 16FRX5CC (CATHETERS) ×2 IMPLANT
CLOTH BEACON ORANGE TIMEOUT ST (SAFETY) ×2 IMPLANT
ELECT REM PT RETURN 9FT ADLT (ELECTROSURGICAL)
ELECTRODE REM PT RTRN 9FT ADLT (ELECTROSURGICAL) IMPLANT
GLOVE BIO SURGEON STRL SZ7.5 (GLOVE) ×2 IMPLANT
GOWN STRL REUS W/TWL XL LVL3 (GOWN DISPOSABLE) ×4 IMPLANT
KIT TURNOVER CYSTO (KITS) ×2 IMPLANT
MANIFOLD NEPTUNE II (INSTRUMENTS) IMPLANT
NDL SAFETY ECLIPSE 18X1.5 (NEEDLE) IMPLANT
NEEDLE HYPO 18GX1.5 SHARP (NEEDLE)
PACK CYSTO (CUSTOM PROCEDURE TRAY) ×4 IMPLANT
SYR 20CC LL (SYRINGE) ×2 IMPLANT
TUBE CONNECTING 12X1/4 (SUCTIONS) IMPLANT
WATER STERILE IRR 3000ML UROMA (IV SOLUTION) ×2 IMPLANT

## 2017-06-29 NOTE — Transfer of Care (Signed)
  Last Vitals:  Vitals Value Taken Time  BP 122/72 06/29/2017  3:03 PM  Temp 36.4 C 06/29/2017  3:04 PM  Pulse 79 06/29/2017  3:08 PM  Resp 21 06/29/2017  3:08 PM  SpO2 100 % 06/29/2017  3:08 PM  Vitals shown include unvalidated device data.  Last Pain:  Vitals:   06/29/17 1300  TempSrc:   PainSc: 8       Patients Stated Pain Goal: 6 (06/29/17 1300) Immediate Anesthesia Transfer of Care Note  Patient: Kari Green  Procedure(s) Performed: Procedure(s) (LRB): CYSTOSCOPY/HYDRODISTENSION (N/A)  Patient Location: PACU  Anesthesia Type: General  Level of Consciousness: awake, alert  and oriented  Airway & Oxygen Therapy: Patient Spontanous Breathing and Patient connected to nasal cannula oxygen  Post-op Assessment: Report given to PACU RN and Post -op Vital signs reviewed and stable  Post vital signs: Reviewed and stable  Complications: No apparent anesthesia complications

## 2017-06-29 NOTE — Anesthesia Postprocedure Evaluation (Signed)
Anesthesia Post Note  Patient: Kari Green  Procedure(s) Performed: CYSTOSCOPY/HYDRODISTENSION (N/A Bladder)     Patient location during evaluation: PACU Anesthesia Type: General Level of consciousness: awake Pain management: pain level controlled Vital Signs Assessment: post-procedure vital signs reviewed and stable Respiratory status: spontaneous breathing Cardiovascular status: stable Anesthetic complications: no    Last Vitals:  Vitals:   06/29/17 1504 06/29/17 1515  BP: 122/72 122/74  Pulse: 64 73  Resp: 11 14  Temp: (!) 36.4 C   SpO2: 100% 100%    Last Pain:  Vitals:   06/29/17 1504  TempSrc:   PainSc: 0-No pain                 Kellen Dutch

## 2017-06-29 NOTE — Discharge Instructions (Signed)
General instructions:     Your recent bladder surgery requires very little post hospital care but some definite precautions.   Because the raw surface inside your bladder and the irritating effects of urine you may expect frequency of urination and/or urgency (a stronger desire to urinate) and perhaps even getting up at night more often. This will usually resolve or improve slowly over the healing period. You may see some blood in your urine over the first 6 weeks. Do not be alarmed, even if the urine was clear for a while. Get off your feet and drink lots of fluids until clearing occurs. If you start to pass clots or don't improve call us.  Diet:  You may return to your normal diet immediately. Because of the raw surface of your bladder, alcohol, spicy foods, foods high in acid and drinks with caffeine may cause irritation or frequency and should be used in moderation. To keep your urine flowing freely and avoid constipation, drink plenty of fluids during the day (8-10 glasses). Tip: Avoid cranberry juice because it is very acidic.  Activity:  Your physical activity doesn't need to be restricted. However, if you are very active, you may see some blood in the urine. We suggest that you reduce your activity under the circumstances until the bleeding has stopped.  Bowels:  It is important to keep your bowels regular during the postoperative period. Straining with bowel movements can cause bleeding. A bowel movement every other day is reasonable. Use a mild laxative if needed, such as milk of magnesia 2-3 tablespoons, or 2 Dulcolax tablets. Call if you continue to have problems. If you had been taking narcotics for pain, before, during or after your surgery, you may be constipated. Take a laxative if necessary.    Medication:  You should resume your pre-surgery medications unless told not to. In addition you may be given an antibiotic to prevent or treat infection. Antibiotics are not always  necessary. All medication should be taken as prescribed until the bottles are finished unless you are having an unusual reaction to one of the drugs.   Post Anesthesia Home Care Instructions  Activity: Get plenty of rest for the remainder of the day. A responsible individual must stay with you for 24 hours following the procedure.  For the next 24 hours, DO NOT: -Drive a car -Paediatric nurse -Drink alcoholic beverages -Take any medication unless instructed by your physician -Make any legal decisions or sign important papers.  Meals: Start with liquid foods such as gelatin or soup. Progress to regular foods as tolerated. Avoid greasy, spicy, heavy foods. If nausea and/or vomiting occur, drink only clear liquids until the nausea and/or vomiting subsides. Call your physician if vomiting continues.  Special Instructions/Symptoms: Your throat may feel dry or sore from the anesthesia or the breathing tube placed in your throat during surgery. If this causes discomfort, gargle with warm salt water. The discomfort should disappear within 24 hours.  If you had a scopolamine patch placed behind your ear for the management of post- operative nausea and/or vomiting:  1. The medication in the patch is effective for 72 hours, after which it should be removed.  Wrap patch in a tissue and discard in the trash. Wash hands thoroughly with soap and water. 2. You may remove the patch earlier than 72 hours if you experience unpleasant side effects which may include dry mouth, dizziness or visual disturbances. 3. Avoid touching the patch. Wash your hands with soap and water after  contact with the patch.

## 2017-06-29 NOTE — Anesthesia Procedure Notes (Signed)
Procedure Name: LMA Insertion Date/Time: 06/29/2017 2:28 PM Performed by: Belinda Block, MD Pre-anesthesia Checklist: Patient identified, Emergency Drugs available, Suction available and Patient being monitored Patient Re-evaluated:Patient Re-evaluated prior to induction Oxygen Delivery Method: Circle system utilized Preoxygenation: Pre-oxygenation with 100% oxygen Induction Type: IV induction Ventilation: Mask ventilation without difficulty LMA: LMA inserted LMA Size: 4.0 Number of attempts: 1 Airway Equipment and Method: Bite block Placement Confirmation: positive ETCO2 Tube secured with: Tape Dental Injury: Teeth and Oropharynx as per pre-operative assessment

## 2017-06-29 NOTE — H&P (Signed)
eval for dysuria  HPI: Kari Green is a 52 year-old female established patient who is here evaluation of voiding dysfunction/pelvic pain.  Her problem has been present for A week.   The patient does not have a history of recurrent urinary tract infections. The patient has not been treated for a recent UTI.   She does have dysuria. The patient's pain is worse with urination.   The patient denies frequency, urgency or incontinence. She does not have hematuria. She is having problems getting her urine stream started. She does have to strain or bear down to start her urinary stream. Her urinary stream does not start and stop during voiding. She does not have a split stream when she urinates. She never has a feeling of incomplete bladder emptying. She has nocturia 1 time per night.   She does have trouble with constipation. She does not complain of dyspareunia. The patient states that recently they have had more stress in life.   She has had a kidney stone. She has had pelvic surgery within the last 12 months. They underwent: Bladder Stretch, Hysterectomy.   The patient has had a flare of her pelvic pain. In the past, she is at hydrodistention which seems to work. She has refused physical therapy.     ALLERGIES: Adhesive tape Codeine Derivatives Crestor TABS Darvocet-N 100 TABS Food Dye Latex Red Dye  Steriods SULFA Urelle TABS    MEDICATIONS: Oxybutynin Chloride 5 mg tablet 1 tablet PO TID PRN  Alprazolam 0.5 mg tablet Oral  Estradiol 0.5 MG Oral Tablet Oral  Hydroxyzine Hcl 25 mg tablet Oral  Nucynta 100 mg tablet Oral  Pantoprazole Sodium 40 mg tablet, delayed release Oral  Propranolol Hcl 20 mg tablet Oral  Synthroid 100 mcg tablet Oral  Topamax 100 MG Oral Tablet Oral  Tramadol Hcl 50 mg tablet 1 Oral Every 6 hours  Vitamin B-12 100 mcg tablet Oral     GU PSH: Cysto Bladder Ureth Biopsy - 2010 Cystoscopy Hydrodistention - 2017, 2016, 2012 Hysterectomy Unilat SO -  2016 Locm 300-399Mg /Ml Iodine,1Ml - 11/01/2016      PSH Notes: Bladder Irrigation, Cystoscopy With Dilation Of Bladder, Bladder Irrigation, Cystoscopy With Dilation Of Bladder, Cholecystectomy, Tonsillectomy, Total Abdominal Hysterectomy, Thyroid Surgery, Cystoscopy With Dilation Of Bladder, Cystoscopy With Biopsy, Knee Replacement Radio frequency abaltion   NON-GU PSH: Cholecystectomy (open) - 2016 Remove Tonsils - 2016 Revise Knee Joint - 2009 Shoulder Surgery (Unspecified)    GU PMH: Flank Pain - 11/08/2016, - 10/26/2016 Interstitial Cystitis (w/o hematuria), Chronic interstitial cystitis without hematuria - 2017 Oth GU systems Signs/Symptoms, Bladder pain - 2017 Gross hematuria, Gross hematuria - 2017 Renal calculus, Calculus of right kidney - 2017 Renal cyst, Renal cyst, acquired - 2017      PMH Notes: 05/08/14 the patient was taken to the operating room for retrograde pyelograms, which were negative, and hydrodistention. The lesions bladder capacity was noted to be approximately 600 mL's. The bladder had fibrotic changes but there were no hunnar ulcers or petechial hemorrhage.   03/14/15 - the patient was seen for evaluation of hematuria. Repeat CT, hematuria protocol, was performed which was negative for any obvious etiology of her hematuria.   03/2015 - hydrodistention in the OR. Bladder capacity 650cc. Friable mucosa.   06/06/2015 - presented to the ED with complaint suprapubic pain that radiated to her back and of gross hematuria - UA was contaminated but showed 5-10RBC/hpf. No culture done. CT scan showed 67mm non-obstructing stone.  NON-GU PMH: Encounter for general adult medical examination without abnormal findings, Encounter for preventive health examination - 2017 Anxiety, Anxiety - 2016 Personal history of other diseases of the digestive system, History of hiatal hernia - 2016 Personal history of other diseases of the musculoskeletal system and connective tissue, History  of arthritis - 2016, History of fibromyalgia, - 2016 Personal history of other diseases of the respiratory system, History of asthma - 2016 Personal history of other diseases of the nervous system and sense organs, History of migraine headaches - 2014, History of sleep apnea, - 2014 Personal history of other endocrine, nutritional and metabolic disease, History of hypothyroidism - 2014, History of hypercholesterolemia, - 2014    FAMILY HISTORY: Cancer - Runs In Family Heart Disease - Runs In Family leukemia - Runs In Family nephrolithiasis - Runs In Family Prostate Cancer - Runs In Family renal failure - Runs In Family   SOCIAL HISTORY: None    Notes: Disabled, Former smoker, Alcohol use, Caffeine use, Number of children, Never a smoker, Tobacco Use, Marital History - Currently Married   REVIEW OF SYSTEMS:    GU Review Female:   Patient reports frequent urination and burning /pain with urination. Patient denies hard to postpone urination, get up at night to urinate, leakage of urine, stream starts and stops, trouble starting your stream, have to strain to urinate, and being pregnant.  Gastrointestinal (Upper):   Patient reports nausea and vomiting. Patient denies indigestion/ heartburn.  Gastrointestinal (Lower):   Patient reports diarrhea and constipation.   Constitutional:   Patient reports fever. Patient denies night sweats, weight loss, and fatigue.  Skin:   Patient denies skin rash/ lesion and itching.  Eyes:   Patient denies double vision and blurred vision.  Ears/ Nose/ Throat:   Patient denies sore throat and sinus problems.  Hematologic/Lymphatic:   Patient denies swollen glands and easy bruising.  Cardiovascular:   Patient denies leg swelling and chest pains.  Respiratory:   Patient denies cough and shortness of breath.  Endocrine:   Patient denies excessive thirst.  Musculoskeletal:   Patient reports back pain. Patient denies joint pain.  Neurological:   Patient denies  headaches and dizziness.  Psychologic:   Patient denies depression and anxiety.   VITAL SIGNS:      06/21/2017 11:20 AM  Weight 125 lb / 56.7 kg  Height 63 in / 160.02 cm  BP 122/86 mmHg  Heart Rate 80 /min  Temperature 97.6 F / 36.4 C  BMI 22.1 kg/m   PAST DATA REVIEWED:  Source Of History:  Patient  X-Ray Review: C.T. Abdomen/Pelvis: Reviewed Films. 4 mm right now obstructing stone in September 2018    PROCEDURES:          Urinalysis Dipstick Dipstick Cont'd Micro  Color: Yellow Bilirubin: Neg WBC/hpf: >60/hpf  Appearance: Cloudy Ketones: Neg RBC/hpf: 0 - 2/hpf  Specific Gravity: 1.020 Blood: Trace Bacteria: Few (10-25/hpf)  pH: 5.5 Protein: 1+ Cystals: Ca Oxalate  Glucose: Neg Urobilinogen: 0.2 Casts: NS (Not Seen)    Nitrites: Neg Trichomonas: Not Present    Leukocyte Esterase: 1+ Mucous: Present      Epithelial Cells: 10 - 20/hpf      Yeast: NS (Not Seen)      Sperm: Not Present    ASSESSMENT:      ICD-10 Details  1 GU:   Dysuria - R30.0      PLAN:           Document Letter(s):  Created for  Patient: Clinical Summary         Notes:   The patient has a history of chronic pelvic pain and chronic bladder pain syndrome. In the past, she has undergone hydrodistention which seemed to work for her. I tried to convince the patient to trial physical therapy as well as intravaginal Valium and other nonsurgical interventions. However, the patient would like to proceed to the operating room for cystoscopy and hydrodistention. We discussed the surgery detail including the risks and benefits and she certainly down this road before and wishes to proceed.

## 2017-06-29 NOTE — Op Note (Signed)
Preoperative diagnosis:  1. Chronic pelvic pain/bladder pain syndrome  Postoperative diagnosis:  1. Same  Procedure: 1. Cystoscopy 2. Hydrodistention  Surgeon: Ardis Hughs, MD  Anesthesia: General  Complications: None  Intraoperative findings: The patient's bladder capacity was approximately 400 cc.  She did have some trabeculations.  There was hyperemia within the bladder, no Hunnar ulcers or any other stigmata of IC.  EBL: Minimal  Specimens: None  Indication: Kari Green is a 52 y.o. patient with chronic pain syndrome with severe bladder pain.  She has a chronic history of bladder pain and has responded well in the past hydrodistention.  After reviewing the management options for treatment, he elected to proceed with the above surgical procedure(s). We have discussed the potential benefits and risks of the procedure, side effects of the proposed treatment, the likelihood of the patient achieving the goals of the procedure, and any potential problems that might occur during the procedure or recuperation. Informed consent has been obtained.  Description of procedure:  The patient was taken to the operating room and general anesthesia was induced.  The patient was placed in the dorsal lithotomy position, prepped and draped in the usual sterile fashion, and preoperative antibiotics were administered. A preoperative time-out was performed.   21 French 30 degrees cystoscope was gently passed to the patient's region of the bladder.  The bladder was subsequently drained and then refilled slowly.  360 cystoscopic evaluation was performed with the above findings.  We then hung the fluid to 80 cm above the bladder and filled the bladder to determine her maximum capacity.  Once the water was noted to leak from her urethra I stopped the irrigation and left her bladder hydrodistended for 2 minutes.  We then drained her bladder and measured it to be 400 cc.  There was some hemorrhage  from the bladder dome area.  I then refilled the bladder again to approximately 400 cc and hydrodistended again for 2 minutes before ending the bladder completely.  I remove the scope and inserted a 14 French Foley catheter through which I instilled 400 mg of Pyridium in 15 cc of 1% Marcaine.  Urethral lidocaine jelly was then inserted into her urethra and a B&O suppository placed in the patient's bladder.  She subsequently extubated return of the PACU in stable condition.  Ardis Hughs, M.D.

## 2017-06-29 NOTE — Anesthesia Preprocedure Evaluation (Addendum)
Anesthesia Evaluation  Patient identified by MRN, date of birth, ID band Patient awake    Reviewed: Allergy & Precautions, NPO status , Patient's Chart, lab work & pertinent test results  History of Anesthesia Complications (+) PONV and Family history of anesthesia reaction  Airway Mallampati: II  TM Distance: >3 FB     Dental   Pulmonary asthma , pneumonia, Current Smoker,    breath sounds clear to auscultation       Cardiovascular negative cardio ROS   Rhythm:Regular Rate:Normal     Neuro/Psych    GI/Hepatic hiatal hernia,   Endo/Other  Hypothyroidism   Renal/GU Renal disease     Musculoskeletal  (+) Arthritis ,   Abdominal   Peds  Hematology  (+) anemia ,   Anesthesia Other Findings   Reproductive/Obstetrics                             Anesthesia Physical Anesthesia Plan  ASA: III  Anesthesia Plan: General   Post-op Pain Management:    Induction: Intravenous  PONV Risk Score and Plan: 3 and Treatment may vary due to age or medical condition, Ondansetron, Dexamethasone and Midazolam  Airway Management Planned: LMA  Additional Equipment:   Intra-op Plan:   Post-operative Plan: Extubation in OR  Informed Consent: I have reviewed the patients History and Physical, chart, labs and discussed the procedure including the risks, benefits and alternatives for the proposed anesthesia with the patient or authorized representative who has indicated his/her understanding and acceptance.   Dental advisory given  Plan Discussed with: CRNA and Anesthesiologist  Anesthesia Plan Comments:         Anesthesia Quick Evaluation

## 2017-06-30 ENCOUNTER — Encounter (HOSPITAL_BASED_OUTPATIENT_CLINIC_OR_DEPARTMENT_OTHER): Payer: Self-pay | Admitting: Urology

## 2017-07-05 ENCOUNTER — Other Ambulatory Visit: Payer: Self-pay

## 2017-07-05 ENCOUNTER — Ambulatory Visit (HOSPITAL_BASED_OUTPATIENT_CLINIC_OR_DEPARTMENT_OTHER): Payer: BLUE CROSS/BLUE SHIELD | Admitting: Pain Medicine

## 2017-07-05 ENCOUNTER — Ambulatory Visit
Admission: RE | Admit: 2017-07-05 | Discharge: 2017-07-05 | Disposition: A | Discharge status: Home / Self Care | Payer: BLUE CROSS/BLUE SHIELD | Source: Ambulatory Visit | Attending: Pain Medicine | Admitting: Pain Medicine

## 2017-07-05 ENCOUNTER — Encounter: Payer: Self-pay | Admitting: Pain Medicine

## 2017-07-05 VITALS — BP 120/93 | HR 71 | Temp 96.2°F | Resp 18 | Ht 63.0 in | Wt 119.0 lb

## 2017-07-05 DIAGNOSIS — M79604 Pain in right leg: Secondary | ICD-10-CM | POA: Diagnosis present

## 2017-07-05 DIAGNOSIS — Z882 Allergy status to sulfonamides status: Secondary | ICD-10-CM | POA: Diagnosis not present

## 2017-07-05 DIAGNOSIS — Z91040 Latex allergy status: Secondary | ICD-10-CM | POA: Diagnosis not present

## 2017-07-05 DIAGNOSIS — Z96651 Presence of right artificial knee joint: Secondary | ICD-10-CM | POA: Insufficient documentation

## 2017-07-05 DIAGNOSIS — Z881 Allergy status to other antibiotic agents status: Secondary | ICD-10-CM | POA: Insufficient documentation

## 2017-07-05 DIAGNOSIS — Z7951 Long term (current) use of inhaled steroids: Secondary | ICD-10-CM | POA: Diagnosis not present

## 2017-07-05 DIAGNOSIS — Z9049 Acquired absence of other specified parts of digestive tract: Secondary | ICD-10-CM | POA: Diagnosis not present

## 2017-07-05 DIAGNOSIS — Z85820 Personal history of malignant melanoma of skin: Secondary | ICD-10-CM | POA: Insufficient documentation

## 2017-07-05 DIAGNOSIS — Z888 Allergy status to other drugs, medicaments and biological substances status: Secondary | ICD-10-CM | POA: Diagnosis not present

## 2017-07-05 DIAGNOSIS — Z90722 Acquired absence of ovaries, bilateral: Secondary | ICD-10-CM | POA: Insufficient documentation

## 2017-07-05 DIAGNOSIS — E890 Postprocedural hypothyroidism: Secondary | ICD-10-CM | POA: Diagnosis not present

## 2017-07-05 DIAGNOSIS — Z885 Allergy status to narcotic agent status: Secondary | ICD-10-CM | POA: Diagnosis not present

## 2017-07-05 DIAGNOSIS — G8929 Other chronic pain: Secondary | ICD-10-CM

## 2017-07-05 DIAGNOSIS — Z91010 Allergy to peanuts: Secondary | ICD-10-CM | POA: Diagnosis not present

## 2017-07-05 DIAGNOSIS — Z90710 Acquired absence of both cervix and uterus: Secondary | ICD-10-CM | POA: Diagnosis not present

## 2017-07-05 DIAGNOSIS — Z79899 Other long term (current) drug therapy: Secondary | ICD-10-CM | POA: Diagnosis not present

## 2017-07-05 DIAGNOSIS — Z87442 Personal history of urinary calculi: Secondary | ICD-10-CM | POA: Diagnosis not present

## 2017-07-05 DIAGNOSIS — G90521 Complex regional pain syndrome I of right lower limb: Secondary | ICD-10-CM | POA: Diagnosis not present

## 2017-07-05 DIAGNOSIS — Z79891 Long term (current) use of opiate analgesic: Secondary | ICD-10-CM | POA: Diagnosis not present

## 2017-07-05 DIAGNOSIS — Z981 Arthrodesis status: Secondary | ICD-10-CM | POA: Diagnosis not present

## 2017-07-05 DIAGNOSIS — R110 Nausea: Secondary | ICD-10-CM | POA: Insufficient documentation

## 2017-07-05 DIAGNOSIS — M25561 Pain in right knee: Secondary | ICD-10-CM

## 2017-07-05 DIAGNOSIS — Z79890 Hormone replacement therapy (postmenopausal): Secondary | ICD-10-CM | POA: Diagnosis not present

## 2017-07-05 DIAGNOSIS — Z9889 Other specified postprocedural states: Secondary | ICD-10-CM

## 2017-07-05 HISTORY — PX: OTHER SURGICAL HISTORY: SHX169

## 2017-07-05 MED ORDER — CEFAZOLIN SODIUM-DEXTROSE 1-4 GM/50ML-% IV SOLN: 1.0000 g | Freq: Once | INTRAVENOUS | Status: DC

## 2017-07-05 MED ORDER — BUPIVACAINE-EPINEPHRINE 0.25% -1:200000 IJ SOLN
9.0000 mL | Freq: Once | INTRAMUSCULAR | Status: AC
  Administered 2017-07-05: 9 mL
  Filled 2017-07-05: qty 9

## 2017-07-05 MED ORDER — BUPIVACAINE-EPINEPHRINE (PF) 0.25% -1:200000 IJ SOLN
INTRAMUSCULAR | Status: AC
  Filled 2017-07-05: qty 30

## 2017-07-05 MED ORDER — LIDOCAINE HCL 2 % IJ SOLN
20.0000 mL | Freq: Once | INTRAMUSCULAR | Status: AC
  Administered 2017-07-05: 400 mg
  Filled 2017-07-05: qty 40

## 2017-07-05 MED ORDER — FENTANYL CITRATE (PF) 100 MCG/2ML IJ SOLN
25.0000 ug | INTRAMUSCULAR | Status: DC | PRN
  Administered 2017-07-05: 100 ug via INTRAVENOUS
  Filled 2017-07-05: qty 2

## 2017-07-05 MED ORDER — MIDAZOLAM HCL 5 MG/5ML IJ SOLN
1.0000 mg | INTRAMUSCULAR | Status: DC | PRN
  Administered 2017-07-05: 5 mg via INTRAVENOUS
  Filled 2017-07-05: qty 5

## 2017-07-05 MED ORDER — LACTATED RINGERS IV SOLN
1000.0000 mL | Freq: Once | INTRAVENOUS | Status: AC
  Administered 2017-07-05: 1000 mL via INTRAVENOUS

## 2017-07-05 MED ORDER — CEFAZOLIN SODIUM-DEXTROSE 1-4 GM/50ML-% IV SOLN
1.0000 g | Freq: Once | INTRAVENOUS | Status: AC
  Administered 2017-07-05: 1 g via INTRAVENOUS

## 2017-07-05 MED ORDER — DIPHENHYDRAMINE HCL 50 MG/ML IJ SOLN
50.0000 mg | Freq: Once | INTRAMUSCULAR | Status: AC
  Administered 2017-07-05: 50 mg via INTRAVENOUS
  Filled 2017-07-05: qty 1

## 2017-07-05 MED ORDER — PROMETHAZINE HCL 25 MG/ML IJ SOLN
12.5000 mg | INTRAMUSCULAR | Status: DC | PRN
  Administered 2017-07-05: 12.5 mg via INTRAVENOUS
  Filled 2017-07-05 (×2): qty 1

## 2017-07-05 MED ORDER — CEFAZOLIN SODIUM 1 G IJ SOLR
INTRAMUSCULAR | Status: AC
  Filled 2017-07-05: qty 10

## 2017-07-05 MED ORDER — SODIUM CHLORIDE 0.9 % IJ SOLN
INTRAMUSCULAR | Status: AC
  Filled 2017-07-05: qty 20

## 2017-07-05 MED ORDER — SODIUM CHLORIDE 0.9 % IJ SOLN
INTRAMUSCULAR | Status: AC
  Filled 2017-07-05: qty 10

## 2017-07-05 NOTE — Patient Instructions (Addendum)
____________________________________________________________________________________________  Post-Procedure Discharge Instructions  Instructions:  Apply ice: Fill a plastic sandwich bag with crushed ice. Cover it with a small towel and apply to injection site. Apply for 15 minutes then remove x 15 minutes. Repeat sequence on day of procedure, until you go to bed. The purpose is to minimize swelling and discomfort after procedure.  Apply heat: Apply heat to procedure site starting the day following the procedure. The purpose is to treat any soreness and discomfort from the procedure.  Food intake: Start with clear liquids (like water) and advance to regular food, as tolerated.   Physical activities: Keep activities to a minimum for the first 8 hours after the procedure.   Driving: If you have received any sedation, you are not allowed to drive for 24 hours after your procedure.  Blood thinner: Restart your blood thinner 6 hours after your procedure. (Only for those taking blood thinners)  Insulin: As soon as you can eat, you may resume your normal dosing schedule. (Only for those taking insulin)  Infection prevention: Keep procedure site clean and dry.  Post-procedure Pain Diary: Extremely important that this be done correctly and accurately. Recorded information will be used to determine the next step in treatment.  Pain evaluated is that of treated area only. Do not include pain from an untreated area.  Complete every hour, on the hour, for the initial 8 hours. Set an alarm to help you do this part accurately.  Do not go to sleep and have it completed later. It will not be accurate.  Follow-up appointment: Keep your follow-up appointment after the procedure. Usually 2 weeks for most procedures. (6 weeks in the case of radiofrequency.) Bring you pain diary.   Expect:  From numbing medicine (AKA: Local Anesthetics): Numbness or decrease in pain.  Onset: Full effect within 15  minutes of injected.  Duration: It will depend on the type of local anesthetic used. On the average, 1 to 8 hours.   From steroids: Decrease in swelling or inflammation. Once inflammation is improved, relief of the pain will follow.  Onset of benefits: Depends on the amount of swelling present. The more swelling, the longer it will take for the benefits to be seen. In some cases, up to 10 days.  Duration: Steroids will stay in the system x 2 weeks. Duration of benefits will depend on multiple posibilities including persistent irritating factors.  From procedure: Some discomfort is to be expected once the numbing medicine wears off. This should be minimal if ice and heat are applied as instructed.  Call if:  You experience numbness and weakness that gets worse with time, as opposed to wearing off.  New onset bowel or bladder incontinence. (This applies to Spinal procedures only)  Emergency Numbers:  Durning business hours (Monday - Thursday, 8:00 AM - 4:00 PM) (Friday, 9:00 AM - 12:00 Noon): [phone]  After hours: [phone] ____________________________________________________________________________________________   Radiofrequency Lesioning Radiofrequency lesioning is a procedure that is performed to relieve pain. The procedure is often used for back, neck, or arm pain. Radiofrequency lesioning involves the use of a machine that creates radio waves to make heat. During the procedure, the heat is applied to the nerve that carries the pain signal. The heat damages the nerve and interferes with the pain signal. Pain relief usually starts about 2 weeks after the procedure and lasts for 6 months to 1 year. Tell a health care provider about:  Any allergies you have.  All medicines you are  taking, including vitamins, herbs, eye drops, creams, and over-the-counter medicines.  Any problems you or family members have had with anesthetic medicines.  Any blood disorders you  have.  Any surgeries you have had.  Any medical conditions you have.  Whether you are pregnant or may be pregnant. What are the risks? Generally, this is a safe procedure. However, problems may occur, including:  Pain or soreness at the injection site.  Infection at the injection site.  Damage to nerves or blood vessels.  What happens before the procedure?  Ask your health care provider about: ? Changing or stopping your regular medicines. This is especially important if you are taking diabetes medicines or blood thinners. ? Taking medicines such as aspirin and ibuprofen. These medicines can thin your blood. Do not take these medicines before your procedure if your health care provider instructs you not to.  Follow instructions from your health care provider about eating or drinking restrictions.  Plan to have someone take you home after the procedure.  If you go home right after the procedure, plan to have someone with you for 24 hours. What happens during the procedure?  You will be given one or more of the following: ? A medicine to help you relax (sedative). ? A medicine to numb the area (local anesthetic).  You will be awake during the procedure. You will need to be able to talk with the health care provider during the procedure.  With the help of a type of X-ray (fluoroscopy), the health care provider will insert a radiofrequency needle into the area to be treated.  Next, a wire that carries the radio waves (electrode) will be put through the radiofrequency needle. An electrical pulse will be sent through the electrode to verify the correct nerve. You will feel a tingling sensation, and you may have muscle twitching.  Then, the tissue that is around the needle tip will be heated by an electric current that is passed using the radiofrequency machine. This will numb the nerves.  A bandage (dressing) will be put on the insertion area after the procedure is done. The  procedure may vary among health care providers and hospitals. What happens after the procedure?  Your blood pressure, heart rate, breathing rate, and blood oxygen level will be monitored often until the medicines you were given have worn off.  Return to your normal activities as directed by your health care provider. This information is not intended to replace advice given to you by your health care provider. Make sure you discuss any questions you have with your health care provider. Document Released: 10/07/2010 Document Revised: 07/17/2015 Document Reviewed: 03/18/2014 Elsevier Interactive Patient Education  [employer].

## 2017-07-05 NOTE — Progress Notes (Signed)
Patient's Name: Kari Green  MRN: [id number]  Referring Provider: Milinda Pointer, MD  DOB: 1965/04/01  PCP: Thressa Sheller, MD  DOS: 07/05/2017  Note by: Gaspar Cola, MD  Service setting: Ambulatory outpatient  Specialty: Interventional Pain Management  Patient type: Established  Location: [facility] (AMB) Pain Management Facility  Visit type: Interventional Procedure   Primary Reason for Visit: Interventional Pain Management Treatment. CC: Leg Pain (right)  Procedure:  Anesthesia, Analgesia, Anxiolysis:  Type: Therapeutic Lumbar Sympathetic Radiofrequency Ablation Region:Thoracolumbar Level: L3, L4 Laterality: Right-Sided Paravertebral  Type: Moderate (Conscious) Sedation combined with Local Anesthesia Indication(s): Analgesia and Anxiety Route: Intravenous (IV) IV Access: Secured Sedation: Meaningful verbal contact was maintained at all times during the procedure  Local Anesthetic: Lidocaine 1-2%   Indications: 1. CRPS (complex regional pain syndrome) type I of lower limb (Right)   2. Chronic lower extremity pain (Primary Area of Pain) (Right)    Kari Green has been dealing with the above chronic pain for longer than three months and has either failed to respond, was unable to tolerate, or simply did not get enough benefit from other more conservative therapies including, but not limited to: 1. Over-the-counter medications 2. Anti-inflammatory medications 3. Muscle relaxants 4. Membrane stabilizers 5. Opioids 6. Physical therapy 7. Modalities (Heat, ice, etc.) 8. Invasive techniques such as nerve blocks. Kari Green has attained more than 50% relief of the pain from a series of diagnostic injections conducted in separate occasions.  Pain Score: Pre-procedure: 7 /10 Post-procedure: 3 /10  Pre-op Assessment:  [name] is a 52 y.o. (year old), female patient, seen today for interventional treatment. She  has a past surgical history that includes Total  thyroidectomy (1993); cysto with hydrodistension (01/29/2011); Cystoscopy w/ retrogrades (01/29/2011); Fissurectomy (~ 1988 x2); Refractive surgery (Bilateral, ~ 1999; ~ 2013); Nasal sinus surgery (2013); Laparoscopic lysis of adhesions (02/11/2012); Excision haglund's deformity with achilles tendon repair (Bilateral, 2013-2014); Melanoma excision (Left, 2013); Cystoscopy with stent placement (2006); Appendectomy (~ 1992); Tonsillectomy and adenoidectomy (~ 1989); LAPAROSCOPY ADHESIOLYSIS AND REMOVAL POSSIBLE RIGHT OVARY REMNANT (04/18/2000); Knee arthroscopy (Right, 08/16/2000); Carpal tunnel release (Bilateral, right 07/26/2001;  left ?); DEBRIDEMENT RIGHT THUMB MP JOINT (Right, 04/10/2002); RIGHT THUMB FUSION OF MPJ (Right, 10/03/2002); DECOMPRESSION LEFT ULNAR NERVE, ELBOW (Left, 01/29/2003); RIGHT TOTAL KNEE REVISION ARTHROPLASTY (Right, 03/14/2007); CYSTO/ URETHRAL DILATION/ HYDRODISTENTION/ BLADDER BX (04/10/2003); Laparoscopic assisted vaginal hysterectomy (Feb 1999); LAPAROSCOPY LYSIS ADHESIONS/  BILATERAL SALPINGOOPHORECTOMY/  FULGERATION OF ENDOMETRIOSIS (1998); Anterior cervical decomp/discectomy fusion (03/10/2009); Esophagogastroduodenoscopy (egd) with esophageal dilation (10-26-2000); CYSTO/  BLADDER BX (03/08/2008); transthoracic echocardiogram (09/22/2012); cysto with hydrodistension (N/A, 05/08/2014); laparoscopy (02/11/2012); Transanal hemorrhoidal dearterialization (03/26/2011); Knee arthroscopy with fulkerson slide (Right, 11/29/2000); Laparoscopic cholecystectomy (03/04/2002); Total knee arthroplasty (Right, 09/10/2003; 12/08/2012); Carpometacarpel [facility]) suspension plasty (Right, 02/10/2015); Ganglion cyst excision (Right, 02/10/2015); NEGATIVE SLEEP STUDY (2006 approx .  per pt); cysto with hydrodistension (N/A, 03/27/2015); Kidney stone surgery; Anterior cervical decomp/discectomy fusion (N/A, 07/16/2015); Video bronchoscopy (Bilateral, 10/20/2015); Shoulder hemi-arthroplasty (Left, 12/04/2015); EUS (N/A,  04/23/2016); Trigger finger release (Left, 07/06/2016); IR FLUORO GUIDE PORT INSERTION RIGHT (11/04/2016); IR US Guide Vasc Access Right (11/04/2016); Shoulder arthroscopy (Left); Augmentation mammaplasty (Bilateral, 1996; 2001, 2016); Breast surgery (age 52); and Cystoscopy with hydrodistension and biopsy (N/A, 06/29/2017). Kari Green has a current medication list which includes the following prescription(s): alprazolam, cyanocobalamin, diphenhydramine, erenumab-aooe, estradiol, evzio, fluticasone, gabapentin, hydromorphone, hydroxyzine, ketoconazole, levothyroxine, multivitamin with minerals, onabotulinumtoxina, promethazine, propranolol, tizanidine, topiramate, vitamin c, and zolpidem, and the following Facility-Administered Medications: cefazolin, fentanyl, midazolam, and promethazine. Her primarily concern  today is the Leg Pain (right)  Initial Vital Signs:  Pulse/HCG Rate: 71ECG Heart Rate: 85 Temp: 98.2 F (36.8 C) Resp: 16 BP: (!) 142/91 SpO2: 99 %  BMI: Estimated body mass index is 21.08 kg/m as calculated from the following:   Height as of this encounter: 5\' 3"  (1.6 m).   Weight as of this encounter: 119 lb (54 kg).  Risk Assessment: Allergies: Reviewed. She is allergic to cobalt; latex; nickel; peanuts [peanut oil]; red dye; azithromycin; chloraprep one step [chlorhexidine gluconate]; corticosteroids; flexeril [cyclobenzaprine]; morphine and related; other; percocet [oxycodone-acetaminophen]; supartz [sodium hyaluronate (avian)]; surgical lubricant; yellow dyes (non-tartrazine); cephalexin; monistat [miconazole]; prednisone; sulfa antibiotics; yellow dye; soap; and tape.  Allergy Precautions: Latex-free protocol activated Coagulopathies: Reviewed. None identified.  Blood-thinner therapy: None at this time Active Infection(s): Reviewed. None identified. Kari Green is afebrile  Site Confirmation: Kari Green was asked to confirm the procedure and laterality before marking the  site Procedure checklist: Completed Consent: Before the procedure and under the influence of no sedative(s), amnesic(s), or anxiolytics, the patient was informed of the treatment options, risks and possible complications. To fulfill our ethical and legal obligations, as recommended by the American Medical Association's Code of Ethics, I have informed the patient of my clinical impression; the nature and purpose of the treatment or procedure; the risks, benefits, and possible complications of the intervention; the alternatives, including doing nothing; the risk(s) and benefit(s) of the alternative treatment(s) or procedure(s); and the risk(s) and benefit(s) of doing nothing. The patient was provided information about the general risks and possible complications associated with the procedure. These may include, but are not limited to: failure to achieve desired goals, infection, bleeding, organ or nerve damage, allergic reactions, paralysis, and death. In addition, the patient was informed of those risks and complications associated to Spine-related procedures, such as failure to decrease pain; infection (i.e.: Meningitis, epidural or intraspinal abscess); bleeding (i.e.: epidural hematoma, subarachnoid hemorrhage, or any other type of intraspinal or peri-dural bleeding); organ or nerve damage (i.e.: Any type of peripheral nerve, nerve root, or spinal cord injury) with subsequent damage to sensory, motor, and/or autonomic systems, resulting in permanent pain, numbness, and/or weakness of one or several areas of the body; allergic reactions; (i.e.: anaphylactic reaction); and/or death. Furthermore, the patient was informed of those risks and complications associated with the medications. These include, but are not limited to: allergic reactions (i.e.: anaphylactic or anaphylactoid reaction(s)); adrenal axis suppression; blood sugar elevation that in diabetics may result in ketoacidosis or comma; water retention  that in patients with history of congestive heart failure may result in shortness of breath, pulmonary edema, and decompensation with resultant heart failure; weight gain; swelling or edema; medication-induced neural toxicity; particulate matter embolism and blood vessel occlusion with resultant organ, and/or nervous system infarction; and/or aseptic necrosis of one or more joints. Finally, the patient was informed that Medicine is not an exact science; therefore, there is also the possibility of unforeseen or unpredictable risks and/or possible complications that may result in a catastrophic outcome. The patient indicated having understood very clearly. We have given the patient no guarantees and we have made no promises. Enough time was given to the patient to ask questions, all of which were answered to the patient's satisfaction. Ms. Deerman has indicated that she wanted to continue with the procedure. Attestation: I, the ordering provider, attest that I have discussed with the patient the benefits, risks, side-effects, alternatives, likelihood of achieving goals, and potential problems during recovery for the  procedure that I have provided informed consent. Date  Time: 07/05/2017  1:19 PM  Pre-Procedure Preparation:  Monitoring: As per clinic protocol. Respiration, ETCO2, SpO2, BP, heart rate and rhythm monitor placed and checked for adequate function Safety Precautions: Patient was assessed for positional comfort and pressure points before starting the procedure. Time-out: I initiated and conducted the "Time-out" before starting the procedure, as per protocol. The patient was asked to participate by confirming the accuracy of the "Time Out" information. Verification of the correct person, site, and procedure were performed and confirmed by me, the nursing staff, and the patient. "Time-out" conducted as per Joint Commission's Universal Protocol (UP.01.01.01). Time: 1500  Description of Procedure  Process:   Position: Prone with head of the table was raised to facilitate breathing. Target Area: For Lumbar Sympathetic Block(s), the target is the anterolateral aspect of the L3 & L4 vertebral bodies, where the lumbar sympathetic chain resides. Approach: Paravertebral, ipsilateral approach. Area Prepped: Entire Posterior Thoracolumbar Region Prepping solution: Hibiclens (4.0% Chlorhexidine gluconate solution) Safety Precautions: Aspiration looking for blood return was conducted prior to all injections. At no point did we inject any substances, as a needle was being advanced. No attempts were made at seeking any paresthesias. Safe injection practices and needle disposal techniques used. Medications properly checked for expiration dates. SDV (single dose vial) medications used. Description of the Procedure: Protocol guidelines were followed. The patient was placed in position over the procedure table. The target area was identified and the area prepped in the usual manner. The skin and muscle were infiltrated with local anesthetic. Appropriate amount of time allowed to pass for local anesthetics to take effect. Radiofrequency needles were introduced to the target area using fluoroscopic guidance. Using the NeuroTherm NT1100 Radiofrequency Generator, sensory stimulation using 50 Hz was used to locate & identify the nerve, making sure that the needle was positioned such that there was no sensory stimulation below 0.3 V or above 0.7 V. Stimulation using 2 Hz was used to evaluate the motor component. Care was taken not to lesion any nerves that demonstrated motor stimulation of the lower extremities at an output of less than 2.5 times that of the sensory threshold, or a maximum of 2.0 V. Once satisfactory placement of the needles was achieved, the numbing solution was slowly injected after negative aspiration. After waiting for at least 2 minutes, the ablation was performed at 80 degrees C for 60 seconds, using  regular Radiofrequency settings. Once the procedure was completed, the needles were then removed and the area cleansed, making sure to leave some of the prepping solution back to take advantage of its long term bactericidal properties. Intra-operative Compliance: Compliant Vitals:   07/05/17 1541 07/05/17 1553 07/05/17 1602 07/05/17 1613  BP:  132/82 (!) 123/93 (!) 120/93  Pulse:      Resp:  15 10 18   Temp:  (!) 96.4 F (35.8 C)  (!) 96.2 F (35.7 C)  TempSrc: Skin Temporal    SpO2:  100% 100% 100%  Weight:      Height:        Start Time: 1500 hrs. End Time: 1540 hrs. Materials & Medications:  Needle(s) Type: Teflon-coated, curved tip, Radiofrequency needle(s) Gauge: 22G Length: 10cm Medication(s): Please see orders for medications and dosing details.  Imaging Guidance (Spinal):  Type of Imaging Technique: Fluoroscopy Guidance (Spinal) Indication(s): Assistance in needle guidance and placement for procedures requiring needle placement in or near specific anatomical locations not easily accessible without such assistance. Exposure Time: Please see  nurses notes. Contrast: Before injecting any contrast, we confirmed that the patient did not have an allergy to iodine, shellfish, or radiological contrast. Once satisfactory needle placement was completed at the desired level, radiological contrast was injected. Contrast injected under live fluoroscopy. No contrast complications. See chart for type and volume of contrast used. Fluoroscopic Guidance: I was personally present during the use of fluoroscopy. "Tunnel Vision Technique" used to obtain the best possible view of the target area. Parallax error corrected before commencing the procedure. "Direction-depth-direction" technique used to introduce the needle under continuous pulsed fluoroscopy. Once target was reached, antero-posterior, oblique, and lateral fluoroscopic projection used confirm needle placement in all planes. Images permanently  stored in EMR. Interpretation: I personally interpreted the imaging intraoperatively. Adequate needle placement confirmed in multiple planes. Appropriate spread of contrast into desired area was observed. No evidence of afferent or efferent intravascular uptake. No intrathecal or subarachnoid spread observed. Permanent images saved into the patient's record.  Antibiotic Prophylaxis:   Anti-infectives (From admission, onward)   Start     Dose/Rate Route Frequency Ordered Stop   07/05/17 1515  ceFAZolin (ANCEF) IVPB 1 g/50 mL premix     1 g 100 mL/hr over 30 Minutes Intravenous  Once 07/05/17 1507 07/05/17 1539   07/05/17 1445  ceFAZolin (ANCEF) IVPB 1 g/50 mL premix     1 g 100 mL/hr over 30 Minutes Intravenous  Once 07/05/17 1444       Indication(s): None identified  Post-operative Assessment:  Post-procedure Vital Signs:  Pulse/HCG Rate: 7181 Temp: (!) 96.2 F (35.7 C) Resp: 18 BP: (!) 120/93 SpO2: 100 %  EBL: None  Complications: No immediate post-treatment complications observed by team, or reported by patient.  Note: The patient tolerated the entire procedure well. A repeat set of vitals were taken after the procedure and the patient was kept under observation following institutional policy, for this type of procedure. Post-procedural neurological assessment was performed, showing return to baseline, prior to discharge. The patient was provided with post-procedure discharge instructions, including a section on how to identify potential problems. Should any problems arise concerning this procedure, the patient was given instructions to immediately contact us, at any time, without hesitation. In any case, we plan to contact the patient by telephone for a follow-up status report regarding this interventional procedure.  Comments:  No additional relevant information.  Plan of Care    Imaging Orders     DG C-Arm 1-60 Min-No Report  Procedure Orders     Radiofrequency,  sympathectomy  Medications ordered for procedure: Meds ordered this encounter  Medications  . lidocaine (XYLOCAINE) 2 % (with pres) injection 400 mg  . midazolam (VERSED) 5 MG/5ML injection 1-2 mg    Make sure Flumazenil is available in the pyxis when using this medication. If oversedation occurs, administer 0.2 mg IV over 15 sec. If after 45 sec no response, administer 0.2 mg again over 1 min; may repeat at 1 min intervals; not to exceed 4 doses (1 mg)  . fentaNYL (SUBLIMAZE) injection 25-50 mcg    Make sure Narcan is available in the pyxis when using this medication. In the event of respiratory depression (RR< 8/min): Titrate NARCAN (naloxone) in increments of 0.1 to 0.2 mg IV at 2-3 minute intervals, until desired degree of reversal.  . lactated ringers infusion 1,000 mL  . bupivacaine-EPINEPHrine (MARCAINE W/ EPI) 0.25% -1:200000 (with pres) injection 9 mL  . diphenhydrAMINE (BENADRYL) injection 50 mg  . promethazine (PHENERGAN) injection 12.5-25 mg  .  ceFAZolin (ANCEF) IVPB 1 g/50 mL premix    Order Specific Question:   Antibiotic Indication:    Answer:   Surgical Prophylaxis    Order Specific Question:   Other Indication:    Answer:   Procedure Prophylaxis  . ceFAZolin (ANCEF) IVPB 1 g/50 mL premix    Order Specific Question:   Antibiotic Indication:    Answer:   Surgical Prophylaxis   Medications administered: We administered lidocaine, midazolam, fentaNYL, lactated ringers, bupivacaine-EPINEPHrine, diphenhydrAMINE, promethazine, and ceFAZolin.  See the medical record for exact dosing, route, and time of administration.  New Prescriptions   No medications on file   Disposition: Discharge home  Discharge Date & Time: 07/05/2017; 1615 hrs.   Physician-requested Follow-up: Return for Post-RFA eval (6 wks), w/ Dionisio David, NP.  Future Appointments  Date Time Provider [place]  07/12/2017  3:30 PM Melvenia Beam, MD [facility] None  08/22/2017 10:45 AM Vevelyn Francois,  NP [facility] None   Primary Care Physician: Thressa Sheller, MD Location: [facility] Note by: Gaspar Cola, MD Date: 07/05/2017; Time: 5:21 PM  Disclaimer:  Medicine is not an Chief Strategy Officer. The only guarantee in medicine is that nothing is guaranteed. It is important to note that the decision to proceed with this intervention was based on the information collected from the patient. The Data and conclusions were drawn from the patient's questionnaire, the interview, and the physical examination. Because the information was provided in large part by the patient, it cannot be guaranteed that it has not been purposely or unconsciously manipulated. Every effort has been made to obtain as much relevant data as possible for this evaluation. It is important to note that the conclusions that lead to this procedure are derived in large part from the available data. Always take into account that the treatment will also be dependent on availability of resources and existing treatment guidelines, considered by other Pain Management Practitioners as being common knowledge and practice, at the time of the intervention. For Medico-Legal purposes, it is also important to point out that variation in procedural techniques and pharmacological choices are the acceptable norm. The indications, contraindications, technique, and results of the above procedure should only be interpreted and judged by a Board-Certified Interventional Pain Specialist with extensive familiarity and expertise in the same exact procedure and technique.

## 2017-07-06 ENCOUNTER — Telehealth: Payer: Self-pay

## 2017-07-06 NOTE — Telephone Encounter (Signed)
Post procedure phone call.  Left message.  

## 2017-07-07 DIAGNOSIS — F419 Anxiety disorder, unspecified: Secondary | ICD-10-CM | POA: Diagnosis not present

## 2017-07-07 DIAGNOSIS — M797 Fibromyalgia: Secondary | ICD-10-CM | POA: Diagnosis not present

## 2017-07-07 DIAGNOSIS — M79604 Pain in right leg: Secondary | ICD-10-CM | POA: Diagnosis not present

## 2017-07-07 DIAGNOSIS — E538 Deficiency of other specified B group vitamins: Secondary | ICD-10-CM | POA: Diagnosis not present

## 2017-07-07 DIAGNOSIS — Z79899 Other long term (current) drug therapy: Secondary | ICD-10-CM | POA: Diagnosis not present

## 2017-07-11 ENCOUNTER — Encounter: Payer: Self-pay | Admitting: Neurology

## 2017-07-11 ENCOUNTER — Ambulatory Visit (INDEPENDENT_AMBULATORY_CARE_PROVIDER_SITE_OTHER): Payer: BLUE CROSS/BLUE SHIELD | Admitting: Neurology

## 2017-07-11 VITALS — BP 101/75 | HR 78

## 2017-07-11 DIAGNOSIS — G43711 Chronic migraine without aura, intractable, with status migrainosus: Secondary | ICD-10-CM

## 2017-07-11 MED ORDER — SUMATRIPTAN SUCCINATE 6 MG/0.5ML ~~LOC~~ SOLN: 6.0000 mg | SUBCUTANEOUS | 11 refills | Status: DC | PRN

## 2017-07-11 NOTE — Progress Notes (Signed)
Botox- 100 units x 2 vials Lot: [id number] Expiration: 10/2019 NDC: 4037-5436-06  Bacteriostatic 0.9% Sodium Chloride- 72mL total Lot: [id number] Expiration: 06/23/2018 NDC: 3524-8185-90  Dx: B31.121 S/P //BCrn

## 2017-07-11 NOTE — Progress Notes (Signed)
Interval history 07/11/2017:  + masseters, + temples, + eyes,   She is 75% better as far as frequency and severity of migraines.    Interval history 04/05/2017: She has shingles today and she is in pain in her right hip. She has had shinges multiple times. She has had the flu as well and she was in the bed sick and she had some headaches. Her baseline is daily migraines and since her botox (this is her third shot) she has only had one very bad migraine, tremendous improvement. She has a headache today but she is sick.     Consent Form Botulism Toxin Injection For Chronic Migraine  Botulism toxin has been approved by the Federal drug administration for treatment of chronic migraine. Botulism toxin does not cure chronic migraine and it may not be effective in some patients.  The administration of botulism toxin is accomplished by injecting a small amount of toxin into the muscles of the neck and head. Dosage must be titrated for each individual. Any benefits resulting from botulism toxin tend to wear off after 3 months with a repeat injection required if benefit is to be maintained. Injections are usually done every 3-4 months with maximum effect peak achieved by about 2 or 3 weeks. Botulism toxin is expensive and you should be sure of what costs you will incur resulting from the injection.  The side effects of botulism toxin use for chronic migraine may include:   -Transient, and usually mild, facial weakness with facial injections  -Transient, and usually mild, head or neck weakness with head/neck injections  -Reduction or loss of forehead facial animation due to forehead muscle              weakness  -Eyelid drooping  -Dry eye  -Pain at the site of injection or bruising at the site of injection  -Double vision  -Potential unknown long term risks  Contraindications: You should not have Botox if you are pregnant, nursing, allergic to albumin, have an infection, skin condition, or muscle  weakness at the site of the injection, or have myasthenia gravis, Lambert-Eaton syndrome, or ALS.  It is also possible that as with any injection, there may be an allergic reaction or no effect from the medication. Reduced effectiveness after repeated injections is sometimes seen and rarely infection at the injection site may occur. All care will be taken to prevent these side effects. If therapy is given over a long time, atrophy and wasting in the muscle injected may occur. Occasionally the patient's become refractory to treatment because they develop antibodies to the toxin. In this event, therapy needs to be modified.  I have read the above information and consent to the administration of botulism toxin.    ______________  _____   _________________  Patient signature     Date   Witness signature       BOTOX PROCEDURE NOTE FOR MIGRAINE HEADACHE    Contraindications and precautions discussed with patient(above). Aseptic procedure was observed and patient tolerated procedure. Procedure performed by Dr. Georgia Dom  The condition has existed for more than 6 months, and pt does not have a diagnosis of ALS, Myasthenia Gravis or Lambert-Eaton Syndrome. Risks and benefits of injections discussed and pt agrees to proceed with the procedure. Written consent obtained  These injections are medically necessary. He receives good benefits from these injections. These injections do not cause sedations or hallucinations which the oral therapies may cause.  Indication/Diagnosis: chronic migraine BOTOX(J0585) injection  was performed according to protocol by [employer]. 200 units of BOTOX was dissolved into 4 cc NS.  NDC: [address]-25  Type of toxin: Botox  Botox- 100 units x 2 vials Lot: [id number] Expiration: 09/2019 NDC: 8891-6945-03  Bacteriostatic 0.9% Sodium Chloride- 46mL total Lot: [id number] Expiration: 06/23/2018 NDC: 0349-1791-50  Description of procedure:  The patient was placed in a  sitting position. The standard protocol was used for Botox as follows, with 5 units of Botox injected at each site:   -Procerus muscle, midline injection  -Corrugator muscle, bilateral injection  -Frontalis muscle, bilateral injection, with 2 sites each side, medial injection was performed in the upper one third of the frontalis muscle, in the region vertical from the medial inferior edge of the superior orbital rim. The lateral injection was again in the upper one third of the forehead vertically above the lateral limbus of the cornea, 1.5 cm lateral to the medial injection site.  -Temporalis muscle injection, 4 sites, bilaterally. The first injection was 3 cm above the tragus of the ear, second injection site was 1.5 cm to 3 cm up from the first injection site in line with the tragus of the ear. The third injection site was 1.5-3 cm forward between the first 2 injection sites. The fourth injection site was 1.5 cm posterior to the second injection site.  -Occipitalis muscle injection, 3 sites, bilaterally. The first injection was done one half way between the occipital protuberance and the tip of the mastoid process behind the ear. The second injection site was done lateral and superior to the first, 1 fingerbreadth from the first injection. The third injection site was 1 fingerbreadth superiorly and medially from the first injection site.  -Cervical paraspinal muscle injection, 2 sites, bilateral knee first injection site was 1 cm from the midline of the cervical spine, 3 cm inferior to the lower border of the occipital protuberance. The second injection site was 1.5 cm superiorly and laterally to the first injection site.  -Trapezius muscle injection was performed at 3 sites, bilaterally. The first injection site was in the upper trapezius muscle halfway between the inflection point of the neck, and the acromion. The second injection site was one half way between the acromion and the first  injection site. The third injection was done between the first injection site and the inflection point of the neck.   Will return for repeat injection in 3 months.   A 200 unit sof Botox was used, 155 units were injected, the rest of the Botox was wasted. The patient tolerated the procedure well, there were no complications of the above procedure.

## 2017-07-12 ENCOUNTER — Ambulatory Visit: Payer: Medicare Other | Admitting: Neurology

## 2017-07-13 DIAGNOSIS — N3010 Interstitial cystitis (chronic) without hematuria: Secondary | ICD-10-CM | POA: Diagnosis not present

## 2017-07-20 DIAGNOSIS — N390 Urinary tract infection, site not specified: Secondary | ICD-10-CM | POA: Diagnosis not present

## 2017-07-20 DIAGNOSIS — Z5181 Encounter for therapeutic drug level monitoring: Secondary | ICD-10-CM | POA: Diagnosis not present

## 2017-07-20 DIAGNOSIS — R300 Dysuria: Secondary | ICD-10-CM | POA: Diagnosis not present

## 2017-07-20 DIAGNOSIS — F419 Anxiety disorder, unspecified: Secondary | ICD-10-CM | POA: Diagnosis not present

## 2017-07-25 DIAGNOSIS — J449 Chronic obstructive pulmonary disease, unspecified: Secondary | ICD-10-CM | POA: Diagnosis not present

## 2017-07-25 DIAGNOSIS — J309 Allergic rhinitis, unspecified: Secondary | ICD-10-CM | POA: Diagnosis not present

## 2017-08-01 DIAGNOSIS — Z1502 Genetic susceptibility to malignant neoplasm of ovary: Secondary | ICD-10-CM | POA: Diagnosis not present

## 2017-08-01 DIAGNOSIS — Z1509 Genetic susceptibility to other malignant neoplasm: Secondary | ICD-10-CM | POA: Diagnosis not present

## 2017-08-01 DIAGNOSIS — Z9889 Other specified postprocedural states: Secondary | ICD-10-CM | POA: Diagnosis not present

## 2017-08-01 DIAGNOSIS — Z1501 Genetic susceptibility to malignant neoplasm of breast: Secondary | ICD-10-CM | POA: Diagnosis not present

## 2017-08-04 ENCOUNTER — Telehealth: Payer: Self-pay | Admitting: Hematology and Oncology

## 2017-08-04 ENCOUNTER — Encounter: Payer: Self-pay | Admitting: Hematology and Oncology

## 2017-08-04 NOTE — Telephone Encounter (Signed)
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 Appt scheduled with Maudie Mercury from Dr. Harlow Mares office for the pt to see Dr. Lindi Adie on 7/2 at [facility] will notify the pt. I was told the pt has her genetic results with her and will bring them to the appt. Letter mailed.

## 2017-08-08 DIAGNOSIS — M797 Fibromyalgia: Secondary | ICD-10-CM | POA: Diagnosis not present

## 2017-08-08 DIAGNOSIS — M8700 Idiopathic aseptic necrosis of unspecified bone: Secondary | ICD-10-CM | POA: Diagnosis not present

## 2017-08-08 DIAGNOSIS — M25519 Pain in unspecified shoulder: Secondary | ICD-10-CM | POA: Diagnosis not present

## 2017-08-08 DIAGNOSIS — G43009 Migraine without aura, not intractable, without status migrainosus: Secondary | ICD-10-CM | POA: Diagnosis not present

## 2017-08-09 ENCOUNTER — Other Ambulatory Visit: Payer: Self-pay | Admitting: Internal Medicine

## 2017-08-09 DIAGNOSIS — Z1231 Encounter for screening mammogram for malignant neoplasm of breast: Secondary | ICD-10-CM

## 2017-08-15 ENCOUNTER — Other Ambulatory Visit: Payer: Self-pay | Admitting: Pain Medicine

## 2017-08-15 ENCOUNTER — Telehealth: Payer: Self-pay | Admitting: Pain Medicine

## 2017-08-15 DIAGNOSIS — G8929 Other chronic pain: Secondary | ICD-10-CM

## 2017-08-15 DIAGNOSIS — M79604 Pain in right leg: Secondary | ICD-10-CM

## 2017-08-15 DIAGNOSIS — G90521 Complex regional pain syndrome I of right lower limb: Secondary | ICD-10-CM

## 2017-08-15 DIAGNOSIS — Z96651 Presence of right artificial knee joint: Secondary | ICD-10-CM

## 2017-08-15 DIAGNOSIS — J309 Allergic rhinitis, unspecified: Secondary | ICD-10-CM | POA: Diagnosis not present

## 2017-08-15 DIAGNOSIS — M25561 Pain in right knee: Principal | ICD-10-CM

## 2017-08-15 NOTE — Telephone Encounter (Signed)
Patient states she is supposed to have a RF for her knee. Do not see any order. Please have Dr. Dossie Arbour put in order so we can see if it needs Prior Auth

## 2017-08-15 NOTE — Telephone Encounter (Signed)
Voicemail left with patient that I have reviewed her chart to see if there was mention of RF for the knee.  Also if she has had Genicular nerve blocks to the affected knee.  Please call our office.   Having Dr Dossie Arbour review

## 2017-08-15 NOTE — Progress Notes (Signed)
The patient has called indicating that she may need to have her right knee radiofrequency repeated. The last time she had it done was 07/21/2016, more than a year ago. We'll go ahead and plan on repeating this, as soon as we have unavailable appointment.

## 2017-08-15 NOTE — Telephone Encounter (Signed)
FN reports he has sent the order and has sent this back to [facility]

## 2017-08-17 DIAGNOSIS — L738 Other specified follicular disorders: Secondary | ICD-10-CM | POA: Diagnosis not present

## 2017-08-17 DIAGNOSIS — L309 Dermatitis, unspecified: Secondary | ICD-10-CM | POA: Diagnosis not present

## 2017-08-17 DIAGNOSIS — L821 Other seborrheic keratosis: Secondary | ICD-10-CM | POA: Diagnosis not present

## 2017-08-17 DIAGNOSIS — L570 Actinic keratosis: Secondary | ICD-10-CM | POA: Diagnosis not present

## 2017-08-21 DIAGNOSIS — T50995A Adverse effect of other drugs, medicaments and biological substances, initial encounter: Secondary | ICD-10-CM | POA: Diagnosis not present

## 2017-08-22 ENCOUNTER — Other Ambulatory Visit: Payer: Self-pay

## 2017-08-22 ENCOUNTER — Ambulatory Visit: Payer: BLUE CROSS/BLUE SHIELD | Attending: Nurse Practitioner | Admitting: Nurse Practitioner

## 2017-08-22 ENCOUNTER — Encounter: Payer: Self-pay | Admitting: Nurse Practitioner

## 2017-08-22 VITALS — BP 103/58 | HR 59 | Temp 97.7°F | Ht 63.0 in | Wt 118.0 lb

## 2017-08-22 DIAGNOSIS — Z95828 Presence of other vascular implants and grafts: Secondary | ICD-10-CM

## 2017-08-22 DIAGNOSIS — M5116 Intervertebral disc disorders with radiculopathy, lumbar region: Secondary | ICD-10-CM | POA: Diagnosis not present

## 2017-08-22 DIAGNOSIS — Z883 Allergy status to other anti-infective agents status: Secondary | ICD-10-CM | POA: Diagnosis not present

## 2017-08-22 DIAGNOSIS — R1013 Epigastric pain: Secondary | ICD-10-CM | POA: Diagnosis not present

## 2017-08-22 DIAGNOSIS — Z885 Allergy status to narcotic agent status: Secondary | ICD-10-CM | POA: Insufficient documentation

## 2017-08-22 DIAGNOSIS — M5441 Lumbago with sciatica, right side: Secondary | ICD-10-CM | POA: Diagnosis not present

## 2017-08-22 DIAGNOSIS — G90529 Complex regional pain syndrome I of unspecified lower limb: Secondary | ICD-10-CM | POA: Insufficient documentation

## 2017-08-22 DIAGNOSIS — Z888 Allergy status to other drugs, medicaments and biological substances status: Secondary | ICD-10-CM | POA: Insufficient documentation

## 2017-08-22 DIAGNOSIS — M25561 Pain in right knee: Secondary | ICD-10-CM | POA: Diagnosis not present

## 2017-08-22 DIAGNOSIS — M79604 Pain in right leg: Secondary | ICD-10-CM | POA: Diagnosis not present

## 2017-08-22 DIAGNOSIS — Z79891 Long term (current) use of opiate analgesic: Secondary | ICD-10-CM | POA: Insufficient documentation

## 2017-08-22 DIAGNOSIS — F411 Generalized anxiety disorder: Secondary | ICD-10-CM | POA: Insufficient documentation

## 2017-08-22 DIAGNOSIS — G90521 Complex regional pain syndrome I of right lower limb: Secondary | ICD-10-CM | POA: Diagnosis not present

## 2017-08-22 DIAGNOSIS — Z96651 Presence of right artificial knee joint: Secondary | ICD-10-CM | POA: Diagnosis not present

## 2017-08-22 DIAGNOSIS — Z1501 Genetic susceptibility to malignant neoplasm of breast: Secondary | ICD-10-CM | POA: Diagnosis not present

## 2017-08-22 DIAGNOSIS — J4540 Moderate persistent asthma, uncomplicated: Secondary | ICD-10-CM | POA: Insufficient documentation

## 2017-08-22 DIAGNOSIS — M25512 Pain in left shoulder: Secondary | ICD-10-CM | POA: Insufficient documentation

## 2017-08-22 DIAGNOSIS — F17210 Nicotine dependence, cigarettes, uncomplicated: Secondary | ICD-10-CM | POA: Insufficient documentation

## 2017-08-22 DIAGNOSIS — G894 Chronic pain syndrome: Secondary | ICD-10-CM | POA: Insufficient documentation

## 2017-08-22 DIAGNOSIS — M5030 Other cervical disc degeneration, unspecified cervical region: Secondary | ICD-10-CM | POA: Diagnosis not present

## 2017-08-22 DIAGNOSIS — Z79899 Other long term (current) drug therapy: Secondary | ICD-10-CM | POA: Insufficient documentation

## 2017-08-22 DIAGNOSIS — Z881 Allergy status to other antibiotic agents status: Secondary | ICD-10-CM | POA: Diagnosis not present

## 2017-08-22 DIAGNOSIS — M810 Age-related osteoporosis without current pathological fracture: Secondary | ICD-10-CM | POA: Diagnosis not present

## 2017-08-22 DIAGNOSIS — M797 Fibromyalgia: Secondary | ICD-10-CM | POA: Diagnosis not present

## 2017-08-22 DIAGNOSIS — E890 Postprocedural hypothyroidism: Secondary | ICD-10-CM | POA: Insufficient documentation

## 2017-08-22 DIAGNOSIS — Z79890 Hormone replacement therapy (postmenopausal): Secondary | ICD-10-CM | POA: Diagnosis not present

## 2017-08-22 DIAGNOSIS — G8929 Other chronic pain: Secondary | ICD-10-CM

## 2017-08-22 DIAGNOSIS — Z882 Allergy status to sulfonamides status: Secondary | ICD-10-CM | POA: Diagnosis not present

## 2017-08-22 MED ORDER — HEPARIN SOD (PORK) LOCK FLUSH 100 UNIT/ML IV SOLN
500.0000 [IU] | Freq: Once | INTRAVENOUS | Status: DC
  Filled 2017-08-22: qty 5

## 2017-08-22 NOTE — Progress Notes (Signed)
Patient's Name: Kari Green  MRN: [id number]  Referring Provider: Thressa Sheller, MD  DOB: 1965-03-31  PCP: Jani Gravel, MD  DOS: 08/22/2017  Note by: Vevelyn Francois NP  Service setting: Ambulatory outpatient  Specialty: Interventional Pain Management  Location: [facility] (AMB) Pain Management Facility    Patient type: Established    Primary Reason(s) for Visit: Encounter for post-procedure evaluation of chronic illness with mild to moderate exacerbation CC: Knee Pain (right)  HPI  Kari Green is a 52 y.o. year old, female patient, who comes today for a post-procedure evaluation and medication management. She has Irritable bowel syndrome with constipation; Abdominal pain, epigastric; Abdominal adhesions; Hypothyroidism; Memory deficits; Moderate persistent asthma; Allergic rhinitis; Chronic low back pain; CRPS (complex regional pain syndrome) type I of lower limb (Right); History of arthroplasty of knee (Right); Chronic pain syndrome; Chronic shoulder pain (Left); Chronic knee pain (Secondary Area of Pain) (Right); Generalized anxiety disorder; Chronic lower extremity pain (Primary Area of Pain) (Right); Multiple drug allergies (codeine, Lidoderm a DC if, sulfa, latex, IVP dye, Keflex, Augmentin); Latex allergy; Long term current use of opiate analgesic; Long term prescription opiate use; Opiate use; Chronic, continuous use of opioids; History of allergy to IVP dye; Temporomandibular joint disorder; Buzzing in ear; Ear pain, referred; Idiopathic osteoporosis; Nasal septal perforation; History of migraine headaches; Difficulty hearing; H/O respiratory system disease; Fibromyalgia; Deflected nasal septum; Chronic otitis externa; Abnormal auditory perception; Muscle cramps at night; Radiculopathy; Hemoptysis; Cough; S/P shoulder hemiarthroplasty (Left); Cervical lymphadenitis; Venereal wart; Failed total knee, left ([place]); Hearing loss; Nasal ulcer; Vaginal mass; Chronic knee pain after total replacement  of knee joint (Right); Intractable chronic migraine without aura and with status migrainosus; Cancer ([place]); Asthma; Avascular necrosis of bone ([place]); Burn injury; Current smoker; History of bisphosphonate therapy; 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 Her primarily concern today is the Knee Pain (right)  Pain Assessment: Location: Right Knee Radiating: Denies Onset: More than a month ago Duration: Chronic pain Quality: Sharp, Aching, Burning, Discomfort, Throbbing, Constant Severity: 9 /10 (subjective, self-reported pain score)  Note: Reported level is compatible with observation.                          Effect on ADL: limits my daily activities Timing: Constant Modifying factors: medications BP: (!) 103/58  HR: (!) 59  Kari Green was last seen on 08/15/2017 for a procedure. During today's appointment we reviewed Ms. Sosnowski's post-procedure results and for Port-a cath flush  Further details on both, my assessment(s), as well as the proposed treatment plan, please see below.  Post-Procedure Assessment  07/05/2017 Procedure: right therapeutic lumbar sympathetic nerve radiofrequency ablation Pre-procedure pain score:  7/10 Post-procedure pain score: 3/10         Influential Factors: BMI: 20.90 kg/m Intra-procedural challenges: None observed.         Assessment challenges: None detected.              Reported side-effects: None.        Post-procedural adverse reactions or complications: None reported         Sedation: Please see nurses note. When no sedatives are used, the analgesic levels obtained are directly associated to the effectiveness of the local anesthetics. However, when sedation is provided, the level of analgesia obtained during  the initial 1 hour following the intervention, is believed to be the result of a combination of factors. These factors may include, but are not limited to: 1. The effectiveness of the local anesthetics used. 2. The effects of the analgesic(s) and/or anxiolytic(s) used. 3. The degree of discomfort experienced by the patient at the time of the procedure. 4. The patients ability and reliability in recalling and recording the events. 5. The presence and influence of possible secondary gains and/or psychosocial factors. Reported result: Relief experienced during the 1st hour after the procedure: 100 % (Ultra-Short Term Relief)            Interpretative annotation: Clinically appropriate result. Analgesia during this period is likely to be Local Anesthetic and/or IV Sedative (Analgesic/Anxiolytic) related.          Effects of local anesthetic: The analgesic effects attained during this period are directly associated to the localized infiltration of local anesthetics and therefore cary significant diagnostic value as to the etiological location, or anatomical origin, of the pain. Expected duration of relief is directly dependent on the pharmacodynamics of the local anesthetic used. Long-acting (4-6 hours) anesthetics used.  Reported result: Relief during the next 4 to 6 hour after the procedure: 80 % (Short-Term Relief)            Interpretative annotation: Clinically appropriate result. Analgesia during this period is likely to be Local Anesthetic-related.          Long-term benefit: Defined as the period of time past the expected duration of local anesthetics (1 hour for short-acting and 4-6 hours for long-acting). With the possible exception of prolonged sympathetic blockade from the local anesthetics, benefits during this period are typically attributed to, or associated with, other factors such as analgesic sensory neuropraxia, antiinflammatory effects, or beneficial biochemical changes provided by agents  other than the local anesthetics.  Reported result: Extended relief following procedure: 100 % (Long-Term Relief)            Interpretative annotation: Clinically appropriate result. Good relief. No permanent benefit expected. Inflammation plays a part in the etiology to the pain.          Current benefits: Defined as reported results that persistent at this point in time.   Analgesia: 90 % Ms. Helmuth reports the axial pain improved more than the extremity pain. Function: Somewhat improved ROM: Somewhat improved Interpretative annotation: Recurrence of symptoms.              in her right knee. She feels like if the there was no knee pain then she would be at 100%/ She is going to have her genicular nerve block completed in a few months.   Interpretation: Results would suggest a successful therapeutic intervention.                  Plan:  Please see "Plan of Care" for details.                Laboratory Chemistry  Inflammation Markers (CRP: Acute Phase) (ESR: Chronic Phase) Lab Results  Component Value Date   LATICACIDVEN 1.75 09/08/2013                         Rheumatology Markers No results found for: RF, ANA, LABURIC, URICUR, LYMEIGGIGMAB, LYMEABIGMQN, HLAB27                      Renal Function Markers Lab Results  Component Value Date  BUN 20 03/29/2016   CREATININE 0.86 03/29/2016   GFRAA >60 03/29/2016   GFRNONAA >60 03/29/2016                             Hepatic Function Markers Lab Results  Component Value Date   AST 18 12/04/2015   ALT 13 (L) 12/04/2015   ALBUMIN 3.7 12/04/2015   ALKPHOS 56 12/04/2015   LIPASE 28 06/06/2015                        Electrolytes Lab Results  Component Value Date   NA 141 03/29/2016   K 3.4 (L) 03/29/2016   CL 111 03/29/2016   CALCIUM 9.2 03/29/2016                        Neuropathy Markers No results found for: VITAMINB12, FOLATE, HGBA1C, HIV                      Bone Pathology Markers No results found for: VD25OH,  VD125OH2TOT, EV0350KX3, GH8299BZ1, 25OHVITD1, 25OHVITD2, 25OHVITD3, TESTOFREE, TESTOSTERONE                       Coagulation Parameters Lab Results  Component Value Date   INR 0.97 11/04/2016   LABPROT 12.8 11/04/2016   APTT 32 11/04/2016   PLT 307 11/04/2016   DDIMER <0.27 09/22/2012                        Cardiovascular Markers Lab Results  Component Value Date   TROPONINI <0.30 09/08/2013   HGB 13.6 11/04/2016   HCT 39.2 11/04/2016                         CA Markers No results found for: CEA, CA125, LABCA2                      Note: Lab results reviewed.  Recent Diagnostic Imaging Results  DG C-Arm 1-60 Min-No Report Fluoroscopy was utilized by the requesting physician.  No radiographic  interpretation.   Complexity Note: Imaging results reviewed. Results shared with Ms. Thoreson, using Layman's terms.                         Meds   Current Outpatient Medications:  .  ALPRAZolam (XANAX) 0.5 MG tablet, Take 1 mg by mouth 3 (three) times daily as needed. Anxiety, Disp: , Rfl:  .  cyanocobalamin (,VITAMIN B-12,) 1000 MCG/ML injection, Inject 1,000 mcg into the muscle once a week. Reported on 03/25/2015, Disp: , Rfl:  .  diphenhydrAMINE (BENADRYL) 50 MG tablet, Take 50 mg by mouth every 4 (four) hours as needed for itching. , Disp: , Rfl:  .  Erenumab-aooe (AIMOVIG [place]), Inject into the skin every 30 (thirty) days., Disp: , Rfl:  .  estradiol (ESTRACE) 0.5 MG tablet, Take 1 mg by mouth at bedtime. , Disp: , Rfl:  .  EVZIO 0.4 MG/0.4ML SOAJ, AUTO INJECT AS NEEDED IN CASE OF EMERGENCY WHERE GRANDCHILDREN MAY GET A HOLD OF PATIENTS PAIN MEDICATIONS., Disp: , Rfl: 0 .  fluticasone (FLONASE) 50 MCG/ACT nasal spray, SPRAY 2 SPRAYS INTO EACH NOSTRIL DAILY, Disp: 16 g, Rfl: 0 .  gabapentin (NEURONTIN) 300 MG capsule, Take 300 mg by mouth 3 (  three) times daily., Disp: , Rfl:  .  HYDROmorphone (DILAUDID) 2 MG tablet, Take 2 mg by mouth every 4 (four) hours as needed for severe  pain., Disp: , Rfl:  .  hydrOXYzine (ATARAX/VISTARIL) 50 MG tablet, Take 50 mg by mouth every 4 (four) hours as needed for itching. Alternates with benadryl, Disp: , Rfl:  .  ketoconazole (NIZORAL) 2 % shampoo, One application once a month as needed for flaking, Disp: , Rfl: 3 .  levothyroxine (SYNTHROID, LEVOTHROID) 50 MCG tablet, Take 200 mcg by mouth daily. Pt can only take name brand, Disp: , Rfl:  .  Multiple Vitamin (MULTIVITAMIN WITH MINERALS) TABS tablet, Take 1 tablet by mouth at bedtime., Disp: , Rfl:  .  OnabotulinumtoxinA (BOTOX IJ), Inject as directed every 3 (three) months. For migraines, Disp: , Rfl:  .  promethazine (PHENERGAN) 25 MG tablet, Take 1-2 tablets (25-50 mg total) by mouth 2 (two) times daily as needed for nausea or vomiting., Disp: 20 tablet, Rfl: 0 .  propranolol (INDERAL) 20 MG tablet, Take 40 mg by mouth daily. , Disp: , Rfl:  .  SUMAtriptan (IMITREX) 6 MG/0.5ML SOLN injection, Inject 0.5 mLs (6 mg total) into the skin every 2 (two) hours as needed for migraine or headache. can take again in 2hrs.Max twice in 24hrs, Disp: 12 vial, Rfl: 11 .  tiZANidine (ZANAFLEX) 4 MG tablet, Take 4 mg by mouth 3 (three) times daily. , Disp: , Rfl:  .  topiramate (TOPAMAX) 25 MG tablet, Take 200 mg by mouth 2 (two) times daily. , Disp: , Rfl: 1 .  vitamin C (ASCORBIC ACID) 500 MG tablet, Take 1,000 mg by mouth daily., Disp: , Rfl:  .  zolpidem (AMBIEN) 10 MG tablet, Take 10 mg by mouth at bedtime as needed., Disp: , Rfl:   Current Facility-Administered Medications:  .  heparin lock flush 100 unit/mL, 500 Units, Intravenous, Once, [place], [employer], NP  ROS  Constitutional: Denies any fever or chills Gastrointestinal: No reported hemesis, hematochezia, vomiting, or acute GI distress Musculoskeletal: Denies any acute onset joint swelling, redness, loss of ROM, or weakness Neurological: No reported episodes of acute onset apraxia, aphasia, dysarthria, agnosia, amnesia, paralysis, loss  of coordination, or loss of consciousness  Allergies  Ms. Heymann is allergic to cobalt; latex; nickel; peanuts [peanut oil]; red dye; azithromycin; chloraprep one step [chlorhexidine gluconate]; corticosteroids; flexeril [cyclobenzaprine]; morphine and related; other; percocet [oxycodone-acetaminophen]; supartz [sodium hyaluronate (avian)]; surgical lubricant; yellow dyes (non-tartrazine); cephalexin; monistat [miconazole]; prednisone; sulfa antibiotics; yellow dye; soap; and tape.  PFSH  Drug: Ms. Lehnen  reports that she does not use drugs. Alcohol:  reports that she drinks alcohol. Tobacco:  reports that she has been smoking cigarettes.  She has never used smokeless tobacco. Medical:  has a past medical history of Anemia, Anxiety, Arthritis, Asthma, Avascular necrosis (HCC), Basal cell carcinoma, Chronic migraine without aura, intractable, with status migrainosus, Chronic, continuous use of opioids, Complication of anesthesia, Degenerative disc disease, cervical, Degenerative disc disease, lumbar, Delayed gastric emptying, Difficult intravenous access, Difficulty swallowing pills, Esophageal dysmotility, Family history of adverse reaction to anesthesia, Fibromyalgia, Hemangioma of liver, Hemangioma, renal, Hiatal hernia, History of exercise stress test (05/08/2015), History of kidney stones (04/2014), History of skin cancer, IC (interstitial cystitis), Irritable bowel syndrome (IBS), Knee pain, right, Limited joint range of motion, Melanoma ([place]), Memory impairment, Osteoporosis, Pain syndrome, chronic, Pneumonia, PONV (postoperative nausea and vomiting), Port-A-Cath in place (11/04/2016), Post-surgical hypothyroidism, Sciatica, Seasonal allergies, and Squamous carcinoma. Surgical: Ms. Plemmons  has a past surgical history that includes Total thyroidectomy (1993); cysto with hydrodistension (01/29/2011); Cystoscopy w/ retrogrades (01/29/2011); Fissurectomy (~ 1988 x2); Refractive surgery  (Bilateral, ~ 1999; ~ 2013); Nasal sinus surgery (2013); Laparoscopic lysis of adhesions (02/11/2012); Excision haglund's deformity with achilles tendon repair (Bilateral, 2013-2014); Melanoma excision (Left, 2013); Cystoscopy with stent placement (2006); Appendectomy (~ 1992); Tonsillectomy and adenoidectomy (~ 1989); LAPAROSCOPY ADHESIOLYSIS AND REMOVAL POSSIBLE RIGHT OVARY REMNANT (04/18/2000); Knee arthroscopy (Right, 08/16/2000); Carpal tunnel release (Bilateral, right 07/26/2001;  left ?); DEBRIDEMENT RIGHT THUMB MP JOINT (Right, 04/10/2002); RIGHT THUMB FUSION OF MPJ (Right, 10/03/2002); DECOMPRESSION LEFT ULNAR NERVE, ELBOW (Left, 01/29/2003); RIGHT TOTAL KNEE REVISION ARTHROPLASTY (Right, 03/14/2007); CYSTO/ URETHRAL DILATION/ HYDRODISTENTION/ BLADDER BX (04/10/2003); Laparoscopic assisted vaginal hysterectomy (Feb 1999); LAPAROSCOPY LYSIS ADHESIONS/  BILATERAL SALPINGOOPHORECTOMY/  FULGERATION OF ENDOMETRIOSIS (1998); Anterior cervical decomp/discectomy fusion (03/10/2009); Esophagogastroduodenoscopy (egd) with esophageal dilation (10-26-2000); CYSTO/  BLADDER BX (03/08/2008); transthoracic echocardiogram (09/22/2012); cysto with hydrodistension (N/A, 05/08/2014); laparoscopy (02/11/2012); Transanal hemorrhoidal dearterialization (03/26/2011); Knee arthroscopy with fulkerson slide (Right, 11/29/2000); Laparoscopic cholecystectomy (03/04/2002); Total knee arthroplasty (Right, 09/10/2003; 12/08/2012); Carpometacarpel [facility]) suspension plasty (Right, 02/10/2015); Ganglion cyst excision (Right, 02/10/2015); NEGATIVE SLEEP STUDY (2006 approx .  per pt); cysto with hydrodistension (N/A, 03/27/2015); Kidney stone surgery; Anterior cervical decomp/discectomy fusion (N/A, 07/16/2015); Video bronchoscopy (Bilateral, 10/20/2015); Shoulder hemi-arthroplasty (Left, 12/04/2015); EUS (N/A, 04/23/2016); Trigger finger release (Left, 07/06/2016); IR FLUORO GUIDE PORT INSERTION RIGHT (11/04/2016); IR US Guide Vasc Access Right (11/04/2016); Shoulder  arthroscopy (Left); Augmentation mammaplasty (Bilateral, 1996; 2001, 2016); Breast surgery (age 52); Cystoscopy with hydrodistension and biopsy (N/A, 06/29/2017); and sympathetic nerve ablation (07/05/2017). Family: family history includes Anesthesia problems in her mother; Cancer in her daughter, maternal aunt, and mother; Leukemia in her mother.  Constitutional Exam  General appearance: Well nourished, well developed, and well hydrated. In no apparent acute distress Vitals:   08/22/17 1047  BP: (!) 103/58  Pulse: (!) 59  Temp: 97.7 F (36.5 C)  SpO2: 100%  Weight: 118 lb (53.5 kg)  Height: _0  (1.6 m)   BMI Assessment: Estimated body mass index is 20.9 kg/m as calculated from the following:   Height as of this encounter: _1  (1.6 m).   Weight as of this encounter: 118 lb (53.5 kg). Psych/Mental status: Alert, oriented x 3 (person, place, & time)       Eyes: PERLA Respiratory: No evidence of acute respiratory distress  Lumbar Spine Area Exam  Skin & Axial Inspection: No masses, redness, or swelling Alignment: Symmetrical Functional ROM: Unrestricted ROM       Stability: No instability detected Muscle Tone/Strength: Functionally intact. No obvious neuro-muscular anomalies detected. Sensory (Neurological): Unimpaired Palpation: No palpable anomalies       Provocative Tests: Lumbar Hyperextension/rotation test: deferred today       Lumbar quadrant test (Kemp's test): deferred today       Lumbar Lateral bending test: deferred today       Patrick's Maneuver: deferred today                   FABER test: deferred today       Thigh-thrust test: deferred today       S-I compression test: deferred today       S-I distraction test: deferred today        Gait & Posture Assessment  Ambulation: Unassisted Gait: Relatively normal for age and body habitus Posture: WNL   Lower Extremity Exam    Side: Right lower extremity  Side: Left lower  extremity  Stability: No instability  observed          Stability: No instability observed          Skin & Extremity Inspection: Evidence of prior arthroplastic surgery with swelling   Skin & Extremity Inspection: Skin color, temperature, and hair growth are WNL. No peripheral edema or cyanosis. No masses, redness, swelling, asymmetry, or associated skin lesions. No contractures.  Functional ROM: Unrestricted ROM                  Functional ROM: Unrestricted ROM                  Muscle Tone/Strength: Functionally intact. No obvious neuro-muscular anomalies detected.  Muscle Tone/Strength: Functionally intact. No obvious neuro-muscular anomalies detected.  Sensory (Neurological): Unimpaired  Sensory (Neurological): Unimpaired  Palpation: No palpable anomalies  Palpation: No palpable anomalies   Assessment  Primary Diagnosis & Pertinent Problem List: The primary encounter diagnosis was CRPS (complex regional pain syndrome) type I of lower limb (Right). Diagnoses of Chronic lower extremity pain (Primary Area of Pain) (Right), Chronic right-sided low back pain with right-sided sciatica, Port-A-Cath in place, and Chronic pain syndrome were also pertinent to this visit.  Status Diagnosis  Controlled Controlled Controlled 1. CRPS (complex regional pain syndrome) type I of lower limb (Right)   2. Chronic lower extremity pain (Primary Area of Pain) (Right)   3. Chronic right-sided low back pain with right-sided sciatica   4. Port-A-Cath in place   5. 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 heparin lock flush 100 unit/mL   New Prescriptions   No medications on file   Medications administered today: Courney Garrod. [place] had no medications administered during this visit. Lab-work, procedure(s), and/or referral(s): No orders of the defined types were  placed in this encounter.  Imaging and/or referral(s): None   Interventional management options: Planned, scheduled, and/or pending:   Palliative right-sided genicular nerve RFA    Considering:   Palliative right-sided genicular nerve RFA  Palliative right-sided L3 and L4 lumbar sympathetic RFA    Palliative PRN treatment(s):   Palliative right-sided genicular nerve RFA  Palliative right-sided L3 and L4 lumbar sympathetic RFA       Provider-requested follow-up: No follow-ups on file.  Future Appointments  Date Time Provider [place]  08/23/2017  1:00 PM Nicholas Lose, MD [facility] None  08/26/2017  8:00 AM [facility] MM 2 [facility]  10/13/2017  3:30 PM Melvenia Beam, MD [facility] None  10/25/2017  2:15 PM Milinda Pointer, MD [facility] None   Primary Care Physician: Jani Gravel, MD Location: [facility] Note by: Vevelyn Francois NP Date: 08/22/2017; Time: 3:48 PM  Pain Score Disclaimer: We use the NRS-11 scale. This is a self-reported, subjective measurement of pain severity with only modest accuracy. It is used primarily to identify changes within a particular patient. It must be understood that outpatient pain scales are significantly less accurate that those used for research, where they can be applied under ideal controlled circumstances with minimal exposure to variables. In reality, the score is likely to be a combination of pain intensity and pain affect, where pain affect describes the degree of emotional arousal or changes in action readiness caused by the sensory experience of pain. Factors such as social  and work situation, setting, emotional state, anxiety levels, expectation, and prior pain experience may influence pain perception and show large inter-individual differences that may also be affected by time variables.  Patient instructions provided during this appointment: Patient Instructions   ____________________________________________________________________________________________  Medication Rules  Applies to: All patients receiving prescriptions (written or electronic).  Pharmacy of record: Pharmacy where electronic prescriptions will be sent. If written prescriptions are taken to a different pharmacy, please inform the nursing staff. The pharmacy listed in the electronic medical record should be the one where you would like electronic prescriptions to be sent.  Prescription refills: Only during scheduled appointments. Applies to both, written and electronic prescriptions.  NOTE: The following applies primarily to controlled substances (Opioid* Pain Medications).   Patient's responsibilities: 1. Pain Pills: Bring all pain pills to every appointment (except for procedure appointments). 2. Pill Bottles: Bring pills in original pharmacy bottle. Always bring newest bottle. Bring bottle, even if empty. 3. Medication refills: You are responsible for knowing and keeping track of what medications you need refilled. The day before your appointment, write a list of all prescriptions that need to be refilled. Bring that list to your appointment and give it to the admitting nurse. Prescriptions will be written only during appointments. If you forget a medication, it will not be "Called in", "Faxed", or "electronically sent". You will need to get another appointment to get these prescribed. 4. Prescription Accuracy: You are responsible for carefully inspecting your prescriptions before leaving our office. Have the discharge nurse carefully go over each prescription with you, before taking them home. Make sure that your name is accurately spelled, that your address is correct. Check the name and dose of your medication to make sure it is accurate. Check the number of pills, and the written instructions to make sure they are clear and accurate. Make sure that you are given enough medication to  last until your next medication refill appointment. 5. Taking Medication: Take medication as prescribed. Never take more pills than instructed. Never take medication more frequently than prescribed. Taking less pills or less frequently is permitted and encouraged, when it comes to controlled substances (written prescriptions).  6. Inform other Doctors: Always inform, all of your healthcare providers, of all the medications you take. 7. Pain Medication from other Providers: You are not allowed to accept any additional pain medication from any other Doctor or Healthcare provider. There are two exceptions to this rule. (see below) In the event that you require additional pain medication, you are responsible for notifying us, as stated below. 8. Medication Agreement: You are responsible for carefully reading and following our Medication Agreement. This must be signed before receiving any prescriptions from our practice. Safely store a copy of your signed Agreement. Violations to the Agreement will result in no further prescriptions. (Additional copies of our Medication Agreement are available upon request.) 9. Laws, Rules, & Regulations: All patients are expected to follow all Federal and [employer], [employer], Rules, [employer] [facility]. Ignorance of the Laws does not constitute a valid excuse. The use of any illegal substances is prohibited. 10. Adopted CDC guidelines & recommendations: Target dosing levels will be at or below 60 MME/day. Use of benzodiazepines** is not recommended.  Exceptions: There are only two exceptions to the rule of not receiving pain medications from other Healthcare Providers. 1. Exception #1 (Emergencies): In the event of an emergency (i.e.: accident requiring emergency care), you are allowed to receive additional pain medication. However, you are responsible for: As  soon as you are able, call our office [phone], at any time of the day or night, and leave a message stating your  name, the date and nature of the emergency, and the name and dose of the medication prescribed. In the event that your call is answered by a member of our staff, make sure to document and save the date, time, and the name of the person that took your information.  2. Exception #2 (Planned Surgery): In the event that you are scheduled by another doctor or dentist to have any type of surgery or procedure, you are allowed (for a period no longer than 30 days), to receive additional pain medication, for the acute post-op pain. However, in this case, you are responsible for picking up a copy of our "Post-op Pain Management for Surgeons" handout, and giving it to your surgeon or dentist. This document is available at our office, and does not require an appointment to obtain it. Simply go to our office during business hours (Monday-Thursday from 8:00 AM to 4:00 PM) (Friday 8:00 AM to 12:00 Noon) or if you have a scheduled appointment with [place], prior to your surgery, and ask for it by name. In addition, you will need to provide [place] with your name, name of your surgeon, type of surgery, and date of procedure or surgery.  *Opioid medications include: morphine, codeine, oxycodone, oxymorphone, hydrocodone, hydromorphone, meperidine, tramadol, tapentadol, buprenorphine, fentanyl, methadone. **Benzodiazepine medications include: diazepam (Valium), alprazolam (Xanax), clonazepam (Klonopine), lorazepam (Ativan), clorazepate (Tranxene), chlordiazepoxide (Librium), estazolam (Prosom), oxazepam (Serax), temazepam (Restoril), triazolam (Halcion) (Last updated: 04/21/2017) ____________________________________________________________________________________________   ____________________________________________________________________________________________  Preparing for Procedure with Sedation  Instructions: . Oral Intake: Do not eat or drink anything for at least 8 hours prior to your procedure. . Transportation: Public  transportation is not allowed. Bring an adult driver. The driver must be physically present in our waiting room before any procedure can be started. Marland Kitchen Physical Assistance: Bring an adult physically capable of assisting you, in the event you need help. This adult should keep you company at home for at least 6 hours after the procedure. . Blood Pressure Medicine: Take your blood pressure medicine with a sip of water the morning of the procedure. . Blood thinners:  . Diabetics on insulin: Notify the staff so that you can be scheduled 1st case in the morning. If your diabetes requires high dose insulin, take only  of your normal insulin dose the morning of the procedure and notify the staff that you have done so. . Preventing infections: Shower with an antibacterial soap the morning of your procedure. . Build-up your immune system: Take 1000 mg of Vitamin C with every meal (3 times a day) the day prior to your procedure. Marland Kitchen Antibiotics: Inform the staff if you have a condition or reason that requires you to take antibiotics before dental procedures. . Pregnancy: If you are pregnant, call and cancel the procedure. . Sickness: If you have a cold, fever, or any active infections, call and cancel the procedure. . Arrival: You must be in the facility at least 30 minutes prior to your scheduled procedure. . Children: Do not bring children with you. . Dress appropriately: Bring dark clothing that you would not mind if they get stained. . Valuables: Do not bring any jewelry or valuables.  Procedure appointments are reserved for interventional treatments only. Marland Kitchen No Prescription Refills. . No medication changes will be discussed during procedure appointments. . No disability issues will be discussed.  Remember:  Regular Business hours are:  Monday to Thursday 8:00 AM to 4:00 PM  Provider's Schedule: Milinda Pointer, MD:  Procedure days: Tuesday and Thursday 7:30 AM to 4:00 PM  Gillis Santa, MD:   Procedure days: Monday and Wednesday 7:30 AM to 4:00 PM ____________________________________________________________________________________________  Radiofrequency Lesioning Radiofrequency lesioning is a procedure that is performed to relieve pain. The procedure is often used for back, neck, or arm pain. Radiofrequency lesioning involves the use of a machine that creates radio waves to make heat. During the procedure, the heat is applied to the nerve that carries the pain signal. The heat damages the nerve and interferes with the pain signal. Pain relief usually starts about 2 weeks after the procedure and lasts for 6 months to 1 year. Tell a health care provider about:  Any allergies you have.  All medicines you are taking, including vitamins, herbs, eye drops, creams, and over-the-counter medicines.  Any problems you or family members have had with anesthetic medicines.  Any blood disorders you have.  Any surgeries you have had.  Any medical conditions you have.  Whether you are pregnant or may be pregnant. What are the risks? Generally, this is a safe procedure. However, problems may occur, including:  Pain or soreness at the injection site.  Infection at the injection site.  Damage to nerves or blood vessels.  What happens before the procedure?  Ask your health care provider about: ? Changing or stopping your regular medicines. This is especially important if you are taking diabetes medicines or blood thinners. ? Taking medicines such as aspirin and ibuprofen. These medicines can thin your blood. Do not take these medicines before your procedure if your health care provider instructs you not to.  Follow instructions from your health care provider about eating or drinking restrictions.  Plan to have someone take you home after the procedure.  If you go home right after the procedure, plan to have someone with you for 24 hours. What happens during the procedure?  You  will be given one or more of the following: ? A medicine to help you relax (sedative). ? A medicine to numb the area (local anesthetic).  You will be awake during the procedure. You will need to be able to talk with the health care provider during the procedure.  With the help of a type of X-ray (fluoroscopy), the health care provider will insert a radiofrequency needle into the area to be treated.  Next, a wire that carries the radio waves (electrode) will be put through the radiofrequency needle. An electrical pulse will be sent through the electrode to verify the correct nerve. You will feel a tingling sensation, and you may have muscle twitching.  Then, the tissue that is around the needle tip will be heated by an electric current that is passed using the radiofrequency machine. This will numb the nerves.  A bandage (dressing) will be put on the insertion area after the procedure is done. The procedure may vary among health care providers and hospitals. What happens after the procedure?  Your blood pressure, heart rate, breathing rate, and blood oxygen level will be monitored often until the medicines you were given have worn off.  Return to your normal activities as directed by your health care provider. This information is not intended to replace advice given to you by your health care provider. Make sure you discuss any questions you have with your health care provider. Document Released: 10/07/2010 Document Revised: 07/17/2015 Document Reviewed: 03/18/2014  Chartered certified [employer] Patient Education  [employer].

## 2017-08-22 NOTE — Patient Instructions (Addendum)
____________________________________________________________________________________________  Medication Rules  Applies to: All patients receiving prescriptions (written or electronic).  Pharmacy of record: Pharmacy where electronic prescriptions will be sent. If written prescriptions are taken to a different pharmacy, please inform the nursing staff. The pharmacy listed in the electronic medical record should be the one where you would like electronic prescriptions to be sent.  Prescription refills: Only during scheduled appointments. Applies to both, written and electronic prescriptions.  NOTE: The following applies primarily to controlled substances (Opioid* Pain Medications).   Patient's responsibilities: 1. Pain Pills: Bring all pain pills to every appointment (except for procedure appointments). 2. Pill Bottles: Bring pills in original pharmacy bottle. Always bring newest bottle. Bring bottle, even if empty. 3. Medication refills: You are responsible for knowing and keeping track of what medications you need refilled. The day before your appointment, write a list of all prescriptions that need to be refilled. Bring that list to your appointment and give it to the admitting nurse. Prescriptions will be written only during appointments. If you forget a medication, it will not be "Called in", "Faxed", or "electronically sent". You will need to get another appointment to get these prescribed. 4. Prescription Accuracy: You are responsible for carefully inspecting your prescriptions before leaving our office. Have the discharge nurse carefully go over each prescription with you, before taking them home. Make sure that your name is accurately spelled, that your address is correct. Check the name and dose of your medication to make sure it is accurate. Check the number of pills, and the written instructions to make sure they are clear and accurate. Make sure that you are given enough medication to last  until your next medication refill appointment. 5. Taking Medication: Take medication as prescribed. Never take more pills than instructed. Never take medication more frequently than prescribed. Taking less pills or less frequently is permitted and encouraged, when it comes to controlled substances (written prescriptions).  6. Inform other Doctors: Always inform, all of your healthcare providers, of all the medications you take. 7. Pain Medication from other Providers: You are not allowed to accept any additional pain medication from any other Doctor or Healthcare provider. There are two exceptions to this rule. (see below) In the event that you require additional pain medication, you are responsible for notifying us, as stated below. 8. Medication Agreement: You are responsible for carefully reading and following our Medication Agreement. This must be signed before receiving any prescriptions from our practice. Safely store a copy of your signed Agreement. Violations to the Agreement will result in no further prescriptions. (Additional copies of our Medication Agreement are available upon request.) 9. Laws, Rules, & Regulations: All patients are expected to follow all Federal and State Laws, Statutes, Rules, & Regulations. Ignorance of the Laws does not constitute a valid excuse. The use of any illegal substances is prohibited. 10. Adopted CDC guidelines & recommendations: Target dosing levels will be at or below 60 MME/day. Use of benzodiazepines** is not recommended.  Exceptions: There are only two exceptions to the rule of not receiving pain medications from other Healthcare Providers. 1. Exception #1 (Emergencies): In the event of an emergency (i.e.: accident requiring emergency care), you are allowed to receive additional pain medication. However, you are responsible for: As soon as you are able, call our office [phone], at any time of the day or night, and leave a message stating your name, the  date and nature of the emergency, and the name and dose of the medication   prescribed. In the event that your call is answered by a member of our staff, make sure to document and save the date, time, and the name of the person that took your information.  2. Exception #2 (Planned Surgery): In the event that you are scheduled by another doctor or dentist to have any type of surgery or procedure, you are allowed (for a period no longer than 30 days), to receive additional pain medication, for the acute post-op pain. However, in this case, you are responsible for picking up a copy of our "Post-op Pain Management for Surgeons" handout, and giving it to your surgeon or dentist. This document is available at our office, and does not require an appointment to obtain it. Simply go to our office during business hours (Monday-Thursday from 8:00 AM to 4:00 PM) (Friday 8:00 AM to 12:00 Noon) or if you have a scheduled appointment with us, prior to your surgery, and ask for it by name. In addition, you will need to provide us with your name, name of your surgeon, type of surgery, and date of procedure or surgery.  *Opioid medications include: morphine, codeine, oxycodone, oxymorphone, hydrocodone, hydromorphone, meperidine, tramadol, tapentadol, buprenorphine, fentanyl, methadone. **Benzodiazepine medications include: diazepam (Valium), alprazolam (Xanax), clonazepam (Klonopine), lorazepam (Ativan), clorazepate (Tranxene), chlordiazepoxide (Librium), estazolam (Prosom), oxazepam (Serax), temazepam (Restoril), triazolam (Halcion) (Last updated: 04/21/2017) ____________________________________________________________________________________________   ____________________________________________________________________________________________  Preparing for Procedure with Sedation  Instructions: . Oral Intake: Do not eat or drink anything for at least 8 hours prior to your procedure. . Transportation: Public  transportation is not allowed. Bring an adult driver. The driver must be physically present in our waiting room before any procedure can be started. . Physical Assistance: Bring an adult physically capable of assisting you, in the event you need help. This adult should keep you company at home for at least 6 hours after the procedure. . Blood Pressure Medicine: Take your blood pressure medicine with a sip of water the morning of the procedure. . Blood thinners:  . Diabetics on insulin: Notify the staff so that you can be scheduled 1st case in the morning. If your diabetes requires high dose insulin, take only  of your normal insulin dose the morning of the procedure and notify the staff that you have done so. . Preventing infections: Shower with an antibacterial soap the morning of your procedure. . Build-up your immune system: Take 1000 mg of Vitamin C with every meal (3 times a day) the day prior to your procedure. . Antibiotics: Inform the staff if you have a condition or reason that requires you to take antibiotics before dental procedures. . Pregnancy: If you are pregnant, call and cancel the procedure. . Sickness: If you have a cold, fever, or any active infections, call and cancel the procedure. . Arrival: You must be in the facility at least 30 minutes prior to your scheduled procedure. . Children: Do not bring children with you. . Dress appropriately: Bring dark clothing that you would not mind if they get stained. . Valuables: Do not bring any jewelry or valuables.  Procedure appointments are reserved for interventional treatments only. . No Prescription Refills. . No medication changes will be discussed during procedure appointments. . No disability issues will be discussed.  Remember:  Regular Business hours are:  Monday to Thursday 8:00 AM to 4:00 PM  Provider's Schedule: Francisco Naveira, MD:  Procedure days: Tuesday and Thursday 7:30 AM to 4:00 PM  Bilal Lateef, MD:   Procedure days: Monday and   Wednesday 7:30 AM to 4:00 PM ____________________________________________________________________________________________  Radiofrequency Lesioning Radiofrequency lesioning is a procedure that is performed to relieve pain. The procedure is often used for back, neck, or arm pain. Radiofrequency lesioning involves the use of a machine that creates radio waves to make heat. During the procedure, the heat is applied to the nerve that carries the pain signal. The heat damages the nerve and interferes with the pain signal. Pain relief usually starts about 2 weeks after the procedure and lasts for 6 months to 1 year. Tell a health care provider about:  Any allergies you have.  All medicines you are taking, including vitamins, herbs, eye drops, creams, and over-the-counter medicines.  Any problems you or family members have had with anesthetic medicines.  Any blood disorders you have.  Any surgeries you have had.  Any medical conditions you have.  Whether you are pregnant or may be pregnant. What are the risks? Generally, this is a safe procedure. However, problems may occur, including:  Pain or soreness at the injection site.  Infection at the injection site.  Damage to nerves or blood vessels.  What happens before the procedure?  Ask your health care provider about: ? Changing or stopping your regular medicines. This is especially important if you are taking diabetes medicines or blood thinners. ? Taking medicines such as aspirin and ibuprofen. These medicines can thin your blood. Do not take these medicines before your procedure if your health care provider instructs you not to.  Follow instructions from your health care provider about eating or drinking restrictions.  Plan to have someone take you home after the procedure.  If you go home right after the procedure, plan to have someone with you for 24 hours. What happens during the procedure?  You  will be given one or more of the following: ? A medicine to help you relax (sedative). ? A medicine to numb the area (local anesthetic).  You will be awake during the procedure. You will need to be able to talk with the health care provider during the procedure.  With the help of a type of X-ray (fluoroscopy), the health care provider will insert a radiofrequency needle into the area to be treated.  Next, a wire that carries the radio waves (electrode) will be put through the radiofrequency needle. An electrical pulse will be sent through the electrode to verify the correct nerve. You will feel a tingling sensation, and you may have muscle twitching.  Then, the tissue that is around the needle tip will be heated by an electric current that is passed using the radiofrequency machine. This will numb the nerves.  A bandage (dressing) will be put on the insertion area after the procedure is done. The procedure may vary among health care providers and hospitals. What happens after the procedure?  Your blood pressure, heart rate, breathing rate, and blood oxygen level will be monitored often until the medicines you were given have worn off.  Return to your normal activities as directed by your health care provider. This information is not intended to replace advice given to you by your health care provider. Make sure you discuss any questions you have with your health care provider. Document Released: 10/07/2010 Document Revised: 07/17/2015 Document Reviewed: 03/18/2014 Elsevier Interactive Patient Education  2018 [employer]  

## 2017-08-23 ENCOUNTER — Inpatient Hospital Stay: Payer: BLUE CROSS/BLUE SHIELD | Attending: Hematology and Oncology | Admitting: Hematology and Oncology

## 2017-08-23 ENCOUNTER — Other Ambulatory Visit: Payer: Self-pay | Admitting: Allergy and Immunology

## 2017-08-23 DIAGNOSIS — Z79899 Other long term (current) drug therapy: Secondary | ICD-10-CM | POA: Insufficient documentation

## 2017-08-23 DIAGNOSIS — M25519 Pain in unspecified shoulder: Secondary | ICD-10-CM | POA: Diagnosis not present

## 2017-08-23 DIAGNOSIS — F419 Anxiety disorder, unspecified: Secondary | ICD-10-CM | POA: Diagnosis not present

## 2017-08-23 DIAGNOSIS — Z85820 Personal history of malignant melanoma of skin: Secondary | ICD-10-CM | POA: Insufficient documentation

## 2017-08-23 DIAGNOSIS — G43009 Migraine without aura, not intractable, without status migrainosus: Secondary | ICD-10-CM | POA: Diagnosis not present

## 2017-08-23 DIAGNOSIS — M5136 Other intervertebral disc degeneration, lumbar region: Secondary | ICD-10-CM | POA: Diagnosis not present

## 2017-08-23 DIAGNOSIS — K589 Irritable bowel syndrome without diarrhea: Secondary | ICD-10-CM | POA: Diagnosis not present

## 2017-08-23 DIAGNOSIS — F17210 Nicotine dependence, cigarettes, uncomplicated: Secondary | ICD-10-CM | POA: Diagnosis not present

## 2017-08-23 DIAGNOSIS — Z85828 Personal history of other malignant neoplasm of skin: Secondary | ICD-10-CM | POA: Diagnosis not present

## 2017-08-23 DIAGNOSIS — M8700 Idiopathic aseptic necrosis of unspecified bone: Secondary | ICD-10-CM | POA: Diagnosis not present

## 2017-08-23 DIAGNOSIS — M797 Fibromyalgia: Secondary | ICD-10-CM

## 2017-08-23 DIAGNOSIS — Z806 Family history of leukemia: Secondary | ICD-10-CM | POA: Diagnosis not present

## 2017-08-23 DIAGNOSIS — K449 Diaphragmatic hernia without obstruction or gangrene: Secondary | ICD-10-CM | POA: Diagnosis not present

## 2017-08-23 DIAGNOSIS — M5030 Other cervical disc degeneration, unspecified cervical region: Secondary | ICD-10-CM | POA: Diagnosis not present

## 2017-08-23 DIAGNOSIS — D649 Anemia, unspecified: Secondary | ICD-10-CM | POA: Diagnosis not present

## 2017-08-23 DIAGNOSIS — R300 Dysuria: Secondary | ICD-10-CM | POA: Diagnosis not present

## 2017-08-23 DIAGNOSIS — Z8041 Family history of malignant neoplasm of ovary: Secondary | ICD-10-CM | POA: Insufficient documentation

## 2017-08-23 DIAGNOSIS — J45909 Unspecified asthma, uncomplicated: Secondary | ICD-10-CM | POA: Insufficient documentation

## 2017-08-23 DIAGNOSIS — J4541 Moderate persistent asthma with (acute) exacerbation: Secondary | ICD-10-CM

## 2017-08-23 DIAGNOSIS — Z803 Family history of malignant neoplasm of breast: Secondary | ICD-10-CM

## 2017-08-23 DIAGNOSIS — Z1502 Genetic susceptibility to malignant neoplasm of ovary: Secondary | ICD-10-CM

## 2017-08-23 DIAGNOSIS — Z1501 Genetic susceptibility to malignant neoplasm of breast: Secondary | ICD-10-CM

## 2017-08-23 DIAGNOSIS — Z1509 Genetic susceptibility to other malignant neoplasm: Secondary | ICD-10-CM

## 2017-08-23 DIAGNOSIS — Z79891 Long term (current) use of opiate analgesic: Secondary | ICD-10-CM | POA: Diagnosis not present

## 2017-08-23 NOTE — Assessment & Plan Note (Signed)
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 In patients with BRCA1 or 2 mutations, the damaged DNA could not be repaired properly increasing the risk of cancers. These mutations are inherited in autosomal dominant fashion and hence 50% probability that their children may have a BRCA mutation.  Cancer risk:  Breast - 72 percent Ovarian - 44 percent  Other gynecological malignancies: 1.  Fallopian tube cancers: 0.6% 2. primary peritoneal cancer: 1.3% 3.  Endometrial cancer: 1.9% 4.  Pancreatic cancer: 1% 5.  Prostate cancer sent female: 9% Slight increase in colorectal cancers, stomach and biliary cancers have been reported although the absolute risk has not been quantified.  BRCA1 patients are not at risk for melanoma.  Breast cancer risk reduction/surveillance: 1. Annual mammogram and breast MRI are recommended by [facility] starting at age of 25.  2. Chemoprevention with tamoxifen-like agents is not entirely clear.  3. After childbearing, evaluation for prophylactic bilateral mastectomy is an option. 4. Oophorectomy self reduces the risk of breast cancer by 50 all 5. Breast self-examinations starting at age 18   Ovarian cancer risk reduction: 1. Risk reducing bilateral salpingo-oophorectomy between the ages of 35-40 once childbearing is complete is recommended. In one study that was 72% reduction of risk of ovarian cancer and a decrease in breast cancer by approximately 50% 2. There is no clear data for surveillance with CA125 or vaginal ultrasounds although routinely done in practice.  3. Oral contraception dose may be protective against ovarian cancer but it may slightly increase risk of breast cancer.     

## 2017-08-23 NOTE — Progress Notes (Signed)
[facility] NOTE  Patient Care Team: Jani Gravel, MD as PCP - General (Internal Medicine) Teena Irani, MD (Inactive) as Consulting Physician (Gastroenterology) Kathie Rhodes, MD as Consulting Physician (Urology) Tania Ade, MD as Consulting Physician (Orthopedic Surgery)  CHIEF COMPLAINTS/PURPOSE OF CONSULTATION:  BRCA-1 mutation  HISTORY OF PRESENTING ILLNESS:  Kari Green 52 y.o. female is here because of recent diagnosis of BRCA1 mutation.  Patient apparently did 23 and me blood work which told her that she has a BRCA mutation.  I do not have a copy of this report.  She informed us to Dr. Harlow Mares who is her plastic surgeon.  She previously had bilateral implants which were replaced twice.  She was also informed that she has extremely dense breast tissue.  She reports to me that she has extensive family history of cancers including breast cancer.  She has a personal history of melanoma as well.  She was referred to [place] for discussion regarding risk reduction methods. She has had extraordinary number of surgeries including knee replacement surgeries, shoulder replacement surgeries.  She also tells me she has avascular necrosis of the joints. I reviewed her records extensively and collaborated the history with the patient.  MEDICAL HISTORY:  Past Medical History:  Diagnosis Date  . Anemia    Slight  . Anxiety   . Arthritis    right thumb  . Asthma   . Avascular necrosis (HCC)    left shoulder, right knee, jaws/mouth that she knows of at this time  . Basal cell carcinoma   . Chronic migraine without aura, intractable, with status migrainosus    neurologist-  dr Jaynee Eagles--  receives botox injections every 3 months  . Chronic, continuous use of opioids   . Complication of anesthesia    "not asleep and aware of everything"  . Degenerative disc disease, cervical   . Degenerative disc disease, lumbar   . Delayed gastric emptying   . Difficult intravenous  access    Now has Powerport for access  . Difficulty swallowing pills   . Esophageal dysmotility   . Family history of adverse reaction to anesthesia    family - itching, hives, PONV  . Fibromyalgia   . Hemangioma of liver   . Hemangioma, renal   . Hiatal hernia   . History of exercise stress test 05/08/2015   negative stress test without evidence of ischemia at given workload  . History of kidney stones 04/2014  . History of skin cancer    excision melanoma - scalp;  excision behind knee  . IC (interstitial cystitis)    urologist-  dr Louis Meckel  . Irritable bowel syndrome (IBS)   . Knee pain, right    states gets "nerves burned" periodically  . Limited joint range of motion    right knee - unable to fully extend right leg  . Melanoma ([place])   . Memory impairment    due to pain   . Osteoporosis   . Pain syndrome, chronic   . Pneumonia    history of X2  . PONV (postoperative nausea and vomiting)    uncontrollable itching with anesthesia; hx. of anesthesia awareness; pt request needs 50  mg of Benadryl IV/phenergan 50 mg IV  . Port-A-Cath in place 11/04/2016  . Post-surgical hypothyroidism   . Sciatica    bilateral  . Seasonal allergies   . Squamous carcinoma     SURGICAL HISTORY: Past Surgical History:  Procedure Laterality Date  . ANTERIOR CERVICAL  DECOMP/DISCECTOMY FUSION  03/10/2009   C5-6, C6-7  . ANTERIOR CERVICAL DECOMP/DISCECTOMY FUSION N/A 07/16/2015   Procedure: ANTERIOR CERVICAL DECOMPRESSION FUSION 4-5 WITH INSTRUMENTATION AND AUTOGRAFT;  Surgeon: Phylliss Bob, MD;  Location: [place];  Service: Orthopedics;  Laterality: N/A;  ANTERIOR CERVICAL DECOMPRESSION FUSION 4-5 WITH INSTRUMENTATION AND ALLOGRAFT  . APPENDECTOMY  ~ 1992  . AUGMENTATION MAMMAPLASTY Bilateral 1996; 2001, 2016  . BREAST SURGERY  age 60   accessory breast tissue exc  . CARPAL TUNNEL RELEASE Bilateral right 07/26/2001;  left ?  Mortimer Fries SUSPENSION PLASTY Right 02/10/2015   Procedure:  RIGHT THUMB SUSPENSION  ARTHROPLASTY;  Surgeon: Milly Jakob, MD;  Location: [place];  Service: Orthopedics;  Laterality: Right;  ANESTHESIA: PRE- OP BLOCK  . CYSTO WITH HYDRODISTENSION  01/29/2011   Procedure: CYSTOSCOPY/HYDRODISTENSION;  Surgeon: Claybon Jabs, MD;  Location: [facility];  Service: Urology;  Laterality: N/A;  . CYSTO WITH HYDRODISTENSION N/A 05/08/2014   Procedure: CYSTOSCOPY/HYDRODISTENSION WITH BILATERAL RETROGRADES;  Surgeon: Ardis Hughs, MD;  Location: [facility];  Service: Urology;  Laterality: N/A;  . CYSTO WITH HYDRODISTENSION N/A 03/27/2015   Procedure: CYSTOSCOPY/HYDRODISTENSION OF BLADDER, INSTILLATION OF MARCAINE/PYRIDIUM;  Surgeon: Ardis Hughs, MD;  Location: [facility];  Service: Urology;  Laterality: N/A;  . CYSTO/  BLADDER BX  03/08/2008  . CYSTO/ URETHRAL DILATION/ HYDRODISTENTION/ BLADDER BX  04/10/2003  . CYSTOSCOPY W/ RETROGRADES  01/29/2011   Procedure: CYSTOSCOPY WITH RETROGRADE PYELOGRAM;  Surgeon: Claybon Jabs, MD;  Location: [facility];  Service: Urology;  Laterality: Bilateral;  . CYSTOSCOPY WITH HYDRODISTENSION AND BIOPSY N/A 06/29/2017   Procedure: CYSTOSCOPY/HYDRODISTENSION;  Surgeon: Ardis Hughs, MD;  Location[facility];  Service: Urology;  Laterality: N/A;  . CYSTOSCOPY WITH STENT PLACEMENT  2006   URETERAL  . DEBRIDEMENT RIGHT THUMB MP JOINT Right 04/10/2002  . DECOMPRESSION LEFT ULNAR NERVE, ELBOW Left 01/29/2003   2nd time in 2015  . ESOPHAGOGASTRODUODENOSCOPY (EGD) WITH ESOPHAGEAL DILATION  10-26-2000  . EUS N/A 04/23/2016   Procedure: UPPER ENDOSCOPIC ULTRASOUND (EUS) LINEAR;  Surgeon: Carol Ada, MD;  Location: [facility];  Service: Endoscopy;  Laterality: N/A;  . EXCISION HAGLUND'S DEFORMITY WITH ACHILLES TENDON REPAIR Bilateral 2013-2014  . FISSURECTOMY  ~ 1988 x2  . GANGLION CYST EXCISION Right 02/10/2015   Procedure:  REMOVAL GANGLION OF WRIST, PARTIAL ULNAR EXCISION;  Surgeon: Milly Jakob, MD;  Location: [place];  Service: Orthopedics;  Laterality: Right;  . IR FLUORO GUIDE PORT INSERTION RIGHT  11/04/2016  . IR US GUIDE VASC ACCESS RIGHT  11/04/2016  . KIDNEY STONE SURGERY    . KNEE ARTHROSCOPY Right 08/16/2000  . KNEE ARTHROSCOPY WITH FULKERSON SLIDE Right 11/29/2000  . LAPAROSCOPIC ASSISTED VAGINAL HYSTERECTOMY  Feb 1999  . LAPAROSCOPIC CHOLECYSTECTOMY  03/04/2002  . LAPAROSCOPIC LYSIS OF ADHESIONS  02/11/2012   Procedure: LAPAROSCOPIC LYSIS OF ADHESIONS;  Surgeon: Cheri Fowler, MD;  Location: [facility];  Service: Gynecology;;  . LAPAROSCOPY  02/11/2012   Procedure: LAPAROSCOPY DIAGNOSTIC;  Surgeon: Cheri Fowler, MD;  Location: [facility];  Service: Gynecology;  Laterality: N/A;  Diagnostic  Operative Laparoscopic   . LAPAROSCOPY ADHESIOLYSIS AND REMOVAL POSSIBLE RIGHT OVARY REMNANT  04/18/2000  . LAPAROSCOPY LYSIS ADHESIONS/  BILATERAL SALPINGOOPHORECTOMY/  [place] OF ENDOMETRIOSIS  1998  . MELANOMA EXCISION Left 2013   "behind knee"   . NASAL SINUS SURGERY  2013  . NEGATIVE SLEEP STUDY  2006 approx .  per  pt  . REFRACTIVE SURGERY Bilateral ~ 1999; ~ 2013  . RIGHT THUMB FUSION OF MPJ Right 10/03/2002  . RIGHT TOTAL KNEE REVISION ARTHROPLASTY Right 03/14/2007  . SHOULDER ARTHROSCOPY Left    put back in place  . SHOULDER HEMI-ARTHROPLASTY Left 12/04/2015   Procedure: SHOULDER HEMI-ARTHROPLASTY;  Surgeon: Tania Ade, MD;  Location: [place];  Service: Orthopedics;  Laterality: Left;  Left shoulder hemiarthroplasty  . sympathetic nerve ablation  07/05/2017  . TONSILLECTOMY AND ADENOIDECTOMY  ~ 1989  . TOTAL KNEE ARTHROPLASTY Right 09/10/2003; 12/08/2012  . TOTAL THYROIDECTOMY  1993  . TRANSANAL HEMORRHOIDAL DEARTERIALIZATION  03/26/2011  . TRANSTHORACIC ECHOCARDIOGRAM  09/22/2012  . TRIGGER FINGER RELEASE Left 07/06/2016   Procedure: LEFT THUMB TRIGGER RELEASE;  Surgeon: Milly Jakob, MD;  Location: [place];  Service: Orthopedics;  Laterality: Left;  Marland Kitchen VIDEO BRONCHOSCOPY Bilateral 10/20/2015   Procedure: VIDEO BRONCHOSCOPY WITHOUT FLUORO;  Surgeon: Collene Gobble, MD;  Location: [place];  Service: Cardiopulmonary;  Laterality: Bilateral;    SOCIAL HISTORY: Social History   Socioeconomic History  . Marital status: Married    Spouse name: Not on file  . Number of children: Not on file  . Years of education: Not on file  . Highest education level: Not on file  Occupational History  . Not on file  Social Needs  . Financial resource strain: Not on file  . Food insecurity:    Worry: Not on file    Inability: Not on file  . Transportation needs:    Medical: Not on file    Non-medical: Not on file  Tobacco Use  . Smoking status: Light Tobacco Smoker    Types: Cigarettes  . Smokeless tobacco: Never Used  . Tobacco comment: occ  Substance and Sexual Activity  . Alcohol use: Yes    Alcohol/week: 0.0 oz    Comment: rarely  . Drug use: No  . Sexual activity: Not on file  Lifestyle  . Physical activity:    Days per week: Not on file    Minutes per session: Not on file  . Stress: Not on file  Relationships  . Social connections:    Talks on phone: Not on file    Gets together: Not on file    Attends religious service: Not on file    Active member of club or organization: Not on file    Attends meetings of clubs or organizations: Not on file    Relationship status: Not on file  . Intimate partner violence:    Fear of current or ex partner: Not on file    Emotionally abused: Not on file    Physically abused: Not on file    Forced sexual activity: Not on file  Other Topics Concern  . Not on file  Social History Narrative   Lives with husband.      FAMILY HISTORY: Family History  Problem Relation Age of Onset  . Anesthesia problems Mother   . Cancer Mother        ovarian  . Leukemia Mother   . Cancer Daughter         cervical  . Cancer Maternal Aunt        breast    ALLERGIES:  is allergic to cobalt; latex; nickel; peanuts [peanut oil]; red dye; azithromycin; chloraprep one step [chlorhexidine gluconate]; corticosteroids; flexeril [cyclobenzaprine]; morphine and related; other; percocet [oxycodone-acetaminophen]; supartz [sodium hyaluronate (avian)]; surgical lubricant; yellow dyes (non-tartrazine); cephalexin; monistat [miconazole]; prednisone; sulfa antibiotics; yellow  dye; soap; and tape.  MEDICATIONS:  Current Outpatient Medications  Medication Sig Dispense Refill  . ALPRAZolam (XANAX) 0.5 MG tablet Take 1 mg by mouth 3 (three) times daily as needed. Anxiety    . cyanocobalamin (,VITAMIN B-12,) 1000 MCG/ML injection Inject 1,000 mcg into the muscle once a week. Reported on 03/25/2015    . diphenhydrAMINE (BENADRYL) 50 MG tablet Take 50 mg by mouth every 4 (four) hours as needed for itching.     Eduard Roux (AIMOVIG [place]) Inject into the skin every 30 (thirty) days.    Marland Kitchen estradiol (ESTRACE) 0.5 MG tablet Take 1 mg by mouth at bedtime.     Marland Kitchen EVZIO 0.4 MG/0.4ML SOAJ AUTO INJECT AS NEEDED IN CASE OF EMERGENCY WHERE GRANDCHILDREN MAY GET A HOLD OF PATIENTS PAIN MEDICATIONS.  0  . fluticasone (FLONASE) 50 MCG/ACT nasal spray SPRAY 2 SPRAYS INTO EACH NOSTRIL DAILY 16 g 0  . gabapentin (NEURONTIN) 300 MG capsule Take 300 mg by mouth 3 (three) times daily.    Marland Kitchen HYDROmorphone (DILAUDID) 2 MG tablet Take 2 mg by mouth every 4 (four) hours as needed for severe pain.    . hydrOXYzine (ATARAX/VISTARIL) 50 MG tablet Take 50 mg by mouth every 4 (four) hours as needed for itching. Alternates with benadryl    . ketoconazole (NIZORAL) 2 % shampoo One application once a month as needed for flaking  3  . levothyroxine (SYNTHROID, LEVOTHROID) 50 MCG tablet Take 200 mcg by mouth daily. Pt can only take name brand    . Multiple Vitamin (MULTIVITAMIN WITH MINERALS) TABS tablet Take 1 tablet by mouth at bedtime.    .  OnabotulinumtoxinA (BOTOX IJ) Inject as directed every 3 (three) months. For migraines    . promethazine (PHENERGAN) 25 MG tablet Take 1-2 tablets (25-50 mg total) by mouth 2 (two) times daily as needed for nausea or vomiting. 20 tablet 0  . propranolol (INDERAL) 20 MG tablet Take 40 mg by mouth daily.     . SUMAtriptan (IMITREX) 6 MG/0.5ML SOLN injection Inject 0.5 mLs (6 mg total) into the skin every 2 (two) hours as needed for migraine or headache. can take again in 2hrs.Max twice in 24hrs 12 vial 11  . tiZANidine (ZANAFLEX) 4 MG tablet Take 4 mg by mouth 3 (three) times daily.     Marland Kitchen topiramate (TOPAMAX) 25 MG tablet Take 200 mg by mouth 2 (two) times daily.   1  . vitamin C (ASCORBIC ACID) 500 MG tablet Take 1,000 mg by mouth daily.    Marland Kitchen zolpidem (AMBIEN) 10 MG tablet Take 10 mg by mouth at bedtime as needed.     No current facility-administered medications for this visit.     REVIEW OF SYSTEMS:   Constitutional: Denies fevers, chills or abnormal night sweats Eyes: Denies blurriness of vision, double vision or watery eyes Ears, nose, mouth, throat, and face: Denies mucositis or sore throat Respiratory: Denies cough, dyspnea or wheezes Cardiovascular: Denies palpitation, chest discomfort or lower extremity swelling Gastrointestinal:  Denies nausea, heartburn or change in bowel habits Skin: Denies abnormal skin rashes Lymphatics: Denies new lymphadenopathy or easy bruising Neurological:Denies numbness, tingling or new weaknesses Behavioral/Psych: Mood is stable, no new changes  Breast:  Denies any palpable lumps or discharge All other systems were reviewed with the patient and are negative.  PHYSICAL EXAMINATION: ECOG PERFORMANCE STATUS: 0 - Asymptomatic  Vitals:   08/23/17 1255  BP: 136/80  Pulse: 65  Resp: 18  Temp: 97.9 F (36.6 C)  SpO2: 99%   Filed Weights   08/23/17 1255  Weight: 118 lb 11.2 oz (53.8 kg)    GENERAL:alert, no distress and comfortable SKIN: skin  color, texture, turgor are normal, no rashes or significant lesions EYES: normal, conjunctiva are pink and non-injected, sclera clear OROPHARYNX:no exudate, no erythema and lips, buccal mucosa, and tongue normal  NECK: supple, thyroid normal size, non-tender, without nodularity LYMPH:  no palpable lymphadenopathy in the cervical, axillary or inguinal LUNGS: clear to auscultation and percussion with normal breathing effort HEART: regular rate & rhythm and no murmurs and no lower extremity edema ABDOMEN:abdomen soft, non-tender and normal bowel sounds Musculoskeletal:no cyanosis of digits and no clubbing  PSYCH: alert & oriented x 3 with fluent speech NEURO: no focal motor/sensory deficits BREAST: No palpable nodules in breast. No palpable axillary or supraclavicular lymphadenopathy (exam performed in the presence of a chaperone)   LABORATORY DATA:  I have reviewed the data as listed Lab Results  Component Value Date   WBC 5.9 11/04/2016   HGB 13.6 11/04/2016   HCT 39.2 11/04/2016   MCV 85.2 11/04/2016   PLT 307 11/04/2016   Lab Results  Component Value Date   NA 141 03/29/2016   K 3.4 (L) 03/29/2016   CL 111 03/29/2016   CO2 22 03/29/2016    RADIOGRAPHIC STUDIES: I have personally reviewed the radiological reports and agreed with the findings in the report.  ASSESSMENT AND PLAN:  BRCA1 gene mutation positive in female BRCA1 mutation: I discussed with the patient that BRCA1 is a tumor suppressor gene which helps repair damaged DNA. In patients with BRCA1 or 2 mutations, the damaged DNA could not be repaired properly increasing the risk of cancers. These mutations are inherited in autosomal dominant fashion and hence 50% probability that their children may have a BRCA mutation.  Cancer risk:  Breast - 72 percent Ovarian - 44 percent  Other gynecological malignancies: 1.  Fallopian tube cancers: 0.6% 2. primary peritoneal cancer: 1.3% 3.  Endometrial cancer: 1.9% 4.   Pancreatic cancer: 1% 5.  Prostate cancer sent female: 9% Slight increase in colorectal cancers, stomach and biliary cancers have been reported although the absolute risk has not been quantified.  BRCA1 patients are not at risk for melanoma.  Breast cancer risk reduction/surveillance: Patient is interested in bilateral mastectomies. I will arrange for an appointment with Dr. Donne Hazel.  He will work with Dr. Harlow Mares to make a treatment plan. Since she has no ovaries, there is no role of for ovarian cancer or uterine cancer.  Recommendation: Comprehensive genetic testing through our genetic counselors. If it is confirmed that she has a BRCA mutation then she we will see Dr. Donne Hazel for bilateral mastectomies. If she has bilateral mastectomies and there is no further need for [place] to follow her.  All questions were answered. The patient knows to call the clinic with any problems, questions or concerns.    Harriette Ohara, MD 08/23/17

## 2017-08-24 DIAGNOSIS — J309 Allergic rhinitis, unspecified: Secondary | ICD-10-CM | POA: Diagnosis not present

## 2017-08-26 ENCOUNTER — Ambulatory Visit: Payer: BLUE CROSS/BLUE SHIELD

## 2017-08-29 ENCOUNTER — Inpatient Hospital Stay (HOSPITAL_BASED_OUTPATIENT_CLINIC_OR_DEPARTMENT_OTHER): Payer: BLUE CROSS/BLUE SHIELD

## 2017-08-29 ENCOUNTER — Encounter: Payer: Self-pay | Admitting: Genetics

## 2017-08-29 ENCOUNTER — Inpatient Hospital Stay: Payer: BLUE CROSS/BLUE SHIELD

## 2017-08-29 ENCOUNTER — Inpatient Hospital Stay: Payer: BLUE CROSS/BLUE SHIELD | Admitting: Genetics

## 2017-08-29 DIAGNOSIS — Z8041 Family history of malignant neoplasm of ovary: Secondary | ICD-10-CM | POA: Diagnosis not present

## 2017-08-29 DIAGNOSIS — F17210 Nicotine dependence, cigarettes, uncomplicated: Secondary | ICD-10-CM | POA: Diagnosis not present

## 2017-08-29 DIAGNOSIS — Z79899 Other long term (current) drug therapy: Secondary | ICD-10-CM | POA: Diagnosis not present

## 2017-08-29 DIAGNOSIS — F419 Anxiety disorder, unspecified: Secondary | ICD-10-CM | POA: Diagnosis not present

## 2017-08-29 DIAGNOSIS — D649 Anemia, unspecified: Secondary | ICD-10-CM | POA: Diagnosis not present

## 2017-08-29 DIAGNOSIS — M797 Fibromyalgia: Secondary | ICD-10-CM | POA: Diagnosis not present

## 2017-08-29 DIAGNOSIS — K589 Irritable bowel syndrome without diarrhea: Secondary | ICD-10-CM | POA: Diagnosis not present

## 2017-08-29 DIAGNOSIS — Z8042 Family history of malignant neoplasm of prostate: Secondary | ICD-10-CM

## 2017-08-29 DIAGNOSIS — M5136 Other intervertebral disc degeneration, lumbar region: Secondary | ICD-10-CM | POA: Diagnosis not present

## 2017-08-29 DIAGNOSIS — J45909 Unspecified asthma, uncomplicated: Secondary | ICD-10-CM | POA: Diagnosis not present

## 2017-08-29 DIAGNOSIS — Z85820 Personal history of malignant melanoma of skin: Secondary | ICD-10-CM | POA: Diagnosis not present

## 2017-08-29 DIAGNOSIS — M5030 Other cervical disc degeneration, unspecified cervical region: Secondary | ICD-10-CM | POA: Diagnosis not present

## 2017-08-29 DIAGNOSIS — Z806 Family history of leukemia: Secondary | ICD-10-CM

## 2017-08-29 DIAGNOSIS — Z85828 Personal history of other malignant neoplasm of skin: Secondary | ICD-10-CM | POA: Diagnosis not present

## 2017-08-29 DIAGNOSIS — K449 Diaphragmatic hernia without obstruction or gangrene: Secondary | ICD-10-CM | POA: Diagnosis not present

## 2017-08-29 DIAGNOSIS — Z803 Family history of malignant neoplasm of breast: Secondary | ICD-10-CM | POA: Insufficient documentation

## 2017-08-29 DIAGNOSIS — Z808 Family history of malignant neoplasm of other organs or systems: Secondary | ICD-10-CM

## 2017-08-29 DIAGNOSIS — Z1501 Genetic susceptibility to malignant neoplasm of breast: Secondary | ICD-10-CM | POA: Diagnosis not present

## 2017-08-29 DIAGNOSIS — Z95828 Presence of other vascular implants and grafts: Secondary | ICD-10-CM

## 2017-08-29 HISTORY — DX: Family history of malignant neoplasm of ovary: Z80.41

## 2017-08-29 HISTORY — DX: Family history of malignant neoplasm of prostate: Z80.42

## 2017-08-29 HISTORY — DX: Family history of malignant neoplasm of breast: Z80.3

## 2017-08-29 HISTORY — DX: Family history of leukemia: Z80.6

## 2017-08-29 HISTORY — DX: Family history of malignant neoplasm of other organs or systems: Z80.8

## 2017-08-29 MED ORDER — HEPARIN SOD (PORK) LOCK FLUSH 100 UNIT/ML IV SOLN
500.0000 [IU] | Freq: Once | INTRAVENOUS | Status: AC
  Administered 2017-08-29: 500 [IU] via INTRAVENOUS
  Filled 2017-08-29: qty 5

## 2017-08-29 MED ORDER — SODIUM CHLORIDE 0.9% FLUSH
10.0000 mL | INTRAVENOUS | Status: DC | PRN
  Administered 2017-08-29: 10 mL via INTRAVENOUS
  Filled 2017-08-29: qty 10

## 2017-08-29 NOTE — Progress Notes (Addendum)
REFERRING PROVIDER: [place]ose, MD [address] [place], [place] [address]  PRIMARY PROVIDER:  Jani Gravel, MD  PRIMARY REASON FOR VISIT:  1. Family history of brain cancer   2. Family history of breast cancer   3. Family history of leukemia   4. Family history of thyroid cancer   5. Family history of ovarian cancer   6. Family history of prostate cancer     HISTORY OF PRESENT ILLNESS:   Ms. Donahoo, a 52 y.o. female, was seen for a [place] cancer genetics consultation at the request of Dr. Maudie Mercury due to a family history of cancer and a self-reported BRCA1 mutation identified on [employer] direct to consumer genetic testing.   Ms. Coryell presents to clinic today to discuss hereditary predisposition to cancer, genetic testing, and to further clarify her future cancer risks, as well as potential cancer risks for family members.    Ms. Vanburen has a history of melanoma and basal cell skin cancer.  She reports she sees a dermatologist every 6 months. Ms. Wengert reports that she had [employer] direct to consumer genetic testing in December that showed a BRCA1 mutation.  She does not have the report from this testing for review.  We discussed the need to confirm this testing with a clinical test at a lab CLIA approved before medical management should be done for this mutation.   HORMONAL RISK FACTORS:  Menarche was at age 36-11.  First live birth at age 83.  Ovaries intact: no.  Hysterectomy: yes.  Menopausal status: postmenopausal.  Colonoscopy: yes; reports some polyps have been identified, she goes every couple of years for colonocsopy. Mammogram within the last year: yes. Ms. 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  She states she had this 3rd breast removed, but still feels there is some tissue remaining.   Ms. Gest had her thyroid removed due to a large Goiter.  Ms. Swango reports she smoked in the past, quit for 10 years, and recently has  started smoking again.' Ms. Perine reports she has had several bouts of pancreatitis lasing a few days-a week.   Past Medical History:  Diagnosis Date  . Anemia    Slight  . Anxiety   . Arthritis    right thumb  . Asthma   . Avascular necrosis (HCC)    left shoulder, right knee, jaws/mouth that she knows of at this time  . Basal cell carcinoma   . Chronic migraine without aura, intractable, with status migrainosus    neurologist-  dr Jaynee Eagles--  receives botox injections every 3 months  . Chronic, continuous use of opioids   . Complication of anesthesia    "not asleep and aware of everything"  . Degenerative disc disease, cervical   . Degenerative disc disease, lumbar   . Delayed gastric emptying   . Difficult intravenous access    Now has Powerport for access  . Difficulty swallowing pills   . Esophageal dysmotility   . Family history of adverse reaction to anesthesia    family - itching, hives, PONV  . Family history of breast cancer 08/29/2017  . Family history of leukemia 08/29/2017  . Family history of ovarian cancer 08/29/2017  . Family history of prostate cancer 08/29/2017  . Family history of thyroid cancer 08/29/2017  . Fibromyalgia   . Hemangioma of liver   . Hemangioma, renal   . Hiatal hernia   . History of exercise stress test 05/08/2015   negative stress test  without evidence of ischemia at given workload  . History of kidney stones 04/2014  . History of skin cancer    excision melanoma - scalp;  excision behind knee  . IC (interstitial cystitis)    urologist-  dr Louis Meckel  . Irritable bowel syndrome (IBS)   . Knee pain, right    states gets "nerves burned" periodically  . Limited joint range of motion    right knee - unable to fully extend right leg  . Melanoma ([place])   . Memory impairment    due to pain   . Osteoporosis   . Pain syndrome, chronic   . Pneumonia    history of X2  . PONV (postoperative nausea and vomiting)    uncontrollable itching with  anesthesia; hx. of anesthesia awareness; pt request needs 50  mg of Benadryl IV/phenergan 50 mg IV  . Port-A-Cath in place 11/04/2016  . Post-surgical hypothyroidism   . Sciatica    bilateral  . Seasonal allergies   . Squamous carcinoma     Past Surgical History:  Procedure Laterality Date  . ANTERIOR CERVICAL DECOMP/DISCECTOMY FUSION  03/10/2009   C5-6, C6-7  . ANTERIOR CERVICAL DECOMP/DISCECTOMY FUSION N/A 07/16/2015   Procedure: ANTERIOR CERVICAL DECOMPRESSION FUSION 4-5 WITH INSTRUMENTATION AND AUTOGRAFT;  Surgeon: Phylliss Bob, MD;  Location: [place] [facility] [place];  Service: Orthopedics;  Laterality: N/A;  ANTERIOR CERVICAL DECOMPRESSION FUSION 4-5 WITH INSTRUMENTATION AND ALLOGRAFT  . APPENDECTOMY  ~ 1992  . AUGMENTATION MAMMAPLASTY Bilateral 1996; 2001, 2016  . BREAST SURGERY  age 26   accessory breast tissue exc  . CARPAL TUNNEL RELEASE Bilateral right 07/26/2001;  left ?  Mortimer Fries SUSPENSION PLASTY Right 02/10/2015   Procedure: RIGHT THUMB SUSPENSION  ARTHROPLASTY;  Surgeon: Milly Jakob, MD;  Location: [place];  Service: Orthopedics;  Laterality: Right;  ANESTHESIA: PRE- OP BLOCK  . CYSTO WITH HYDRODISTENSION  01/29/2011   Procedure: CYSTOSCOPY/HYDRODISTENSION;  Surgeon: Claybon Jabs, MD;  Location[facility];  Service: Urology;  Laterality: N/A;  . CYSTO WITH HYDRODISTENSION N/A 05/08/2014   Procedure: CYSTOSCOPY/HYDRODISTENSION WITH BILATERAL RETROGRADES;  Surgeon: Ardis Hughs, MD;  Location: [facility];  Service: Urology;  Laterality: N/A;  . CYSTO WITH HYDRODISTENSION N/A 03/27/2015   Procedure: CYSTOSCOPY/HYDRODISTENSION OF BLADDER, INSTILLATION OF MARCAINE/PYRIDIUM;  Surgeon: Ardis Hughs, MD;  Location: [facility];  Service: Urology;  Laterality: N/A;  . CYSTO/  BLADDER BX  03/08/2008  . CYSTO/ URETHRAL DILATION/ HYDRODISTENTION/ BLADDER BX  04/10/2003  . CYSTOSCOPY W/ RETROGRADES  01/29/2011    Procedure: CYSTOSCOPY WITH RETROGRADE PYELOGRAM;  Surgeon: Claybon Jabs, MD;  Location: [facility];  Service: Urology;  Laterality: Bilateral;  . CYSTOSCOPY WITH HYDRODISTENSION AND BIOPSY N/A 06/29/2017   Procedure: CYSTOSCOPY/HYDRODISTENSION;  Surgeon: Ardis Hughs, MD;  Location[facility];  Service: Urology;  Laterality: N/A;  . CYSTOSCOPY WITH STENT PLACEMENT  2006   URETERAL  . DEBRIDEMENT RIGHT THUMB MP JOINT Right 04/10/2002  . DECOMPRESSION LEFT ULNAR NERVE, ELBOW Left 01/29/2003   2nd time in 2015  . ESOPHAGOGASTRODUODENOSCOPY (EGD) WITH ESOPHAGEAL DILATION  10-26-2000  . EUS N/A 04/23/2016   Procedure: UPPER ENDOSCOPIC ULTRASOUND (EUS) LINEAR;  Surgeon: Carol Ada, MD;  Location: [facility];  Service: Endoscopy;  Laterality: N/A;  . EXCISION HAGLUND'S DEFORMITY WITH ACHILLES TENDON REPAIR Bilateral 2013-2014  . FISSURECTOMY  ~ 1988 x2  . GANGLION CYST EXCISION Right 02/10/2015   Procedure: REMOVAL GANGLION OF WRIST, PARTIAL  ULNAR EXCISION;  Surgeon: Milly Jakob, MD;  Location: [place];  Service: Orthopedics;  Laterality: Right;  . IR FLUORO GUIDE PORT INSERTION RIGHT  11/04/2016  . IR US GUIDE VASC ACCESS RIGHT  11/04/2016  . KIDNEY STONE SURGERY    . KNEE ARTHROSCOPY Right 08/16/2000  . KNEE ARTHROSCOPY WITH FULKERSON SLIDE Right 11/29/2000  . LAPAROSCOPIC ASSISTED VAGINAL HYSTERECTOMY  Feb 1999  . LAPAROSCOPIC CHOLECYSTECTOMY  03/04/2002  . LAPAROSCOPIC LYSIS OF ADHESIONS  02/11/2012   Procedure: LAPAROSCOPIC LYSIS OF ADHESIONS;  Surgeon: Cheri Fowler, MD;  Location: [facility];  Service: Gynecology;;  . LAPAROSCOPY  02/11/2012   Procedure: LAPAROSCOPY DIAGNOSTIC;  Surgeon: Cheri Fowler, MD;  Location: [facility];  Service: Gynecology;  Laterality: N/A;  Diagnostic  Operative Laparoscopic   . LAPAROSCOPY ADHESIOLYSIS AND REMOVAL POSSIBLE RIGHT OVARY REMNANT  04/18/2000  . LAPAROSCOPY LYSIS ADHESIONS/  BILATERAL  SALPINGOOPHORECTOMY/  [place] OF ENDOMETRIOSIS  1998  . MELANOMA EXCISION Left 2013   "behind knee"   . NASAL SINUS SURGERY  2013  . NEGATIVE SLEEP STUDY  2006 approx .  per pt  . REFRACTIVE SURGERY Bilateral ~ 1999; ~ 2013  . RIGHT THUMB FUSION OF MPJ Right 10/03/2002  . RIGHT TOTAL KNEE REVISION ARTHROPLASTY Right 03/14/2007  . SHOULDER ARTHROSCOPY Left    put back in place  . SHOULDER HEMI-ARTHROPLASTY Left 12/04/2015   Procedure: SHOULDER HEMI-ARTHROPLASTY;  Surgeon: Tania Ade, MD;  Location: [place];  Service: Orthopedics;  Laterality: Left;  Left shoulder hemiarthroplasty  . sympathetic nerve ablation  07/05/2017  . TONSILLECTOMY AND ADENOIDECTOMY  ~ 1989  . TOTAL KNEE ARTHROPLASTY Right 09/10/2003; 12/08/2012  . TOTAL THYROIDECTOMY  1993  . TRANSANAL HEMORRHOIDAL DEARTERIALIZATION  03/26/2011  . TRANSTHORACIC ECHOCARDIOGRAM  09/22/2012  . TRIGGER FINGER RELEASE Left 07/06/2016   Procedure: LEFT THUMB TRIGGER RELEASE;  Surgeon: Milly Jakob, MD;  Location: [place];  Service: Orthopedics;  Laterality: Left;  Marland Kitchen VIDEO BRONCHOSCOPY Bilateral 10/20/2015   Procedure: VIDEO BRONCHOSCOPY WITHOUT FLUORO;  Surgeon: Collene Gobble, MD;  Location: [place];  Service: Cardiopulmonary;  Laterality: Bilateral;    Social History   Socioeconomic History  . Marital status: Married    Spouse name: Not on file  . Number of children: Not on file  . Years of education: Not on file  . Highest education level: Not on file  Occupational History  . Not on file  Social Needs  . Financial resource strain: Not on file  . Food insecurity:    Worry: Not on file    Inability: Not on file  . Transportation needs:    Medical: Not on file    Non-medical: Not on file  Tobacco Use  . Smoking status: Light Tobacco Smoker    Types: Cigarettes  . Smokeless tobacco: Never Used  . Tobacco comment: occ  Substance and Sexual Activity  . Alcohol use: Yes    Alcohol/week: 0.0 oz     Comment: rarely  . Drug use: No  . Sexual activity: Not on file  Lifestyle  . Physical activity:    Days per week: Not on file    Minutes per session: Not on file  . Stress: Not on file  Relationships  . Social connections:    Talks on phone: Not on file    Gets together: Not on file    Attends religious service: Not on file    Active member of club or organization: Not on file  Attends meetings of clubs or organizations: Not on file    Relationship status: Not on file  Other Topics Concern  . Not on file  Social History Narrative   Lives with husband.       FAMILY HISTORY:  We obtained a detailed, 4-generation family history.  Significant diagnoses are listed below: Family History  Problem Relation Age of Onset  . Anesthesia problems Mother   . Leukemia Mother 14  . Ovarian cancer Mother 31  . Cervical cancer Daughter        cervical  . Breast cancer Maternal Aunt        age dx unk  . Skin cancer Maternal Grandmother   . Throat cancer Maternal Grandfather   . Thyroid cancer Maternal Aunt   . Lung cancer Maternal Aunt   . Cervical cancer Maternal Aunt   . Cervical cancer Maternal Aunt   . Thyroid cancer Maternal Uncle   . Cancer Maternal Aunt        sinus  . Prostate cancer Other   . Brain cancer Cousin   . Other Cousin        brain tumor  . Leukemia Cousin   . Bone cancer Cousin   . Cervical cancer Cousin    Ms. Taves has a 67 year-old daughter who has a history of endometriosis and cervical cancer. Ms. Bulls has a brother who was killed at the age of 28.    Ms. Herbel's father: 72, unk health history Paternal Aunts/Uncles: limited information, 2 paternal aunts, 1 paternal aunle.  Some cancer is on this side, but details unk. One aunt had "gigantism" Paternal cousins: unk- some cancer unk details Paternal grandfather: unk Paternal grandmother: heart attack, possibly cancer  Ms. Lopezgarcia's mother: 40, currently being treated for leukemia dx  4 years ago.  She has a mass on her pancreas, and has a history of ovarian cancer dx in her 5's.  Maternal Aunts/Uncles: 4 maternal uncles, 6 maternal aunts. 1 maternal uncle had thyroid cancer, the others might have had cancer- limited information.  -1 maternal aunt died of breast cancer.  -1 maternal aunt died of cervical cancer. -1 maternal aunt has a hx of sinus cancer -1 maternal aunt had thyroid cancer and lung cancer -1 maternal aunt had lung cancer -1 maternal aunt either had cervical or ovarian cancer.  Maternal cousins: 1 maternal cousin had leukemia, 2 maternal cousins had brain cancer, a couple of maternal cousins had cancerous cells frozen off their cervix, 1 maternal cousin had bone cancer.  Maternal grandfather: throat cancer. He had several siblings who all died of cancer.  Maternal grandmother:hx of skin cancer, died at 31. She had brothers who had prostate cancer, and brothers who had leukemia.   Ms. Jago is unaware of previous family history of genetic testing for hereditary cancer risks. Patient's maternal ancestors are of Eastern/Western European/[place] descent, and paternal ancestors are of Eastern/Western European descent. There is no reported Ashkenazi Jewish ancestry. There is no known consanguinity.  GENETIC COUNSELING ASSESSMENT: ANTOINETT DORMAN is a 52 y.o. female with a family history which is somewhat suggestive of a Hereditary Cancer Predisposition Syndrome. We, therefore, discussed and recommended the following at today's visit.   DISCUSSION: We reviewed the characteristics, features and inheritance patterns of hereditary cancer syndromes. We also discussed genetic testing, including the appropriate family members to test, the process of testing, insurance coverage and turn-around-time for results. We discussed the implications of a negative, positive and/or variant of uncertain significant  result. We recommended Ms. Thielke pursue genetic testing for  the Multi-Cancers gene panel.   The Multi-Cancer Panel offered by [facility] includes sequencing and/or deletion duplication testing of the following 83 genes: ALK, APC, ATM, AXIN2,BAP1,  BARD1, BLM, BMPR1A, BRCA1, BRCA2, BRIP1, CASR, CDC73, CDH1, CDK4, CDKN1B, CDKN1C, CDKN2A (p14ARF), CDKN2A (p16INK4a), CEBPA, CHEK2, CTNNA1, DICER1, DIS3L2, EGFR (c.2369C>T, p.Thr790Met variant only), EPCAM (Deletion/duplication testing only), FH, FLCN, GATA2, GPC3, GREM1 (Promoter region deletion/duplication testing only), HOXB13 (c.251G>A, p.Gly84Glu), HRAS, KIT, MAX, MEN1, MET, MITF (c.952G>A, p.Glu318Lys variant only), MLH1, MSH2, MSH3, MSH6, MUTYH, NBN, NF1, NF2, NTHL1, PALB2, PDGFRA, PHOX2B, PMS2, POLD1, POLE, POT1, PRKAR1A, PTCH1, PTEN, RAD50, RAD51C, RAD51D, RB1, RECQL4, RET, RUNX1, SDHAF2, SDHA (sequence changes only), SDHB, SDHC, SDHD, SMAD4, SMARCA4, SMARCB1, SMARCE1, STK11, SUFU, TERC, TERT, TMEM127, TP53, TSC1, TSC2, VHL, WRN and WT1.   We discussed that only 5-10% of cancers are associated with a Hereditary cancer predisposition syndrome.  One of the most common hereditary cancer syndromes that increases breast cancer risk is called Hereditary Breast and Ovarian Cancer (HBOC) syndrome.  This syndrome is caused by mutations in the BRCA1 and BRCA2 genes.  This syndrome increases an individual's lifetime risk to develop breast, ovarian, pancreatic, and other types of cancer.  There are also many other cancer predisposition syndromes caused by mutations in several other genes.  We discussed that if she is found to have a mutation in one of these genes, it may impact future medical management recommendations such as increased cancer screenings and consideration of risk reducing surgeries.  A positive result could also have implications for the patient's family members.  A Negative result would mean we did not identify a hereditary predisposition to cancer, but does not rule out the possibility of a hereditary  predisposition to cancer.  There could be mutations that are undetectable by current technology, or in genes not yet tested or identified to increase cancer risk.    We discussed the potential to find a Variant of Uncertain Significance or VUS.  These are variants that have not yet been identified as pathogenic or benign, and it is unknown if this variant is associated with increased cancer risk or if this is a normal finding.  Most VUS's are reclassified to benign or likely benign.   It should not be used to make medical management decisions. With time, we suspect the lab will determine the significance of any VUS's identified if any.   Based on Ms. Mchatton's family history of cancer, she meets medical criteria for genetic testing. Despite that she meets criteria, she may still have an out of pocket cost. The laboratory can provide her with an estimate of her OOP cost.   We discussed that some people do not want to undergo genetic testing due to fear of genetic discrimination.  A federal law called the Genetic Information Non-Discrimination Act (GINA) of 2008 helps protect individuals against genetic discrimination based on their genetic test results.  It impacts both health insurance and employment.  For health insurance, it protects against increased premiums, being kicked off insurance or being forced to take a test in order to be insured.  For employment it protects against hiring, firing and promoting decisions based on genetic test results.  Health status due to a cancer diagnosis is not protected under GINA.  This law does not protect life insurance, disability insurance, or other types of insurance.   PLAN: After considering the risks, benefits, and limitations, Ms. Esquibel  provided informed consent to pursue genetic  testing and the blood sample was sent to [facility] for analysis of the Multi-Cancer Panel. Results should be available within approximately 2-3 weeks' time, at which  point they will be disclosed by telephone to Ms. Grossi, as will any additional recommendations warranted by these results. Ms. Cosby will receive a summary of her genetic counseling visit and a copy of her results once available. This information will also be available in Epic. We encouraged Ms. Takacs to remain in contact with cancer genetics annually so that we can continuously update the family history and inform her of any changes in cancer genetics and testing that may be of benefit for her family. Ms. Mcmackin questions were answered to her satisfaction today. Our contact information was provided should additional questions or concerns arise.  Based on Ms. Schemm's family history, we recommended her mother and other maternal relatives, also have genetic counseling and testing. Ms. Saner will let us know if we can be of any assistance in coordinating genetic counseling and/or testing for this family member.   Lastly, we encouraged Ms. Karim to remain in contact with cancer genetics annually so that we can continuously update the family history and inform her of any changes in cancer genetics and testing that may be of benefit for this family.   Ms.  Eble questions were answered to her satisfaction today. Our contact information was provided should additional questions or concerns arise. Thank you for the referral and allowing [place] to share in the care of your patient.   Tana Felts, MS, [facility] Certified Genetic Counselor [email or web address] phone: [phone]  The patient was seen for a total of 40 minutes in face-to-face genetic counseling.   This patient was discussed with Drs. Magrinat, Lindi Adie and/or Burr Medico who agrees with the above.

## 2017-08-29 NOTE — Patient Instructions (Signed)
Implanted Port Home Guide An implanted port is a type of central line that is placed under the skin. Central lines are used to provide IV access when treatment or nutrition needs to be given through a person's veins. Implanted ports are used for long-term IV access. An implanted port may be placed because:  You need IV medicine that would be irritating to the small veins in your hands or arms.  You need long-term IV medicines, such as antibiotics.  You need IV nutrition for a long period.  You need frequent blood draws for lab tests.  You need dialysis.  Implanted ports are usually placed in the chest area, but they can also be placed in the upper arm, the abdomen, or the leg. An implanted port has two main parts:  Reservoir. The reservoir is round and will appear as a small, raised area under your skin. The reservoir is the part where a needle is inserted to give medicines or draw blood.  Catheter. The catheter is a thin, flexible tube that extends from the reservoir. The catheter is placed into a large vein. Medicine that is inserted into the reservoir goes into the catheter and then into the vein.  How will I care for my incision site? Do not get the incision site wet. Bathe or shower as directed by your health care provider. How is my port accessed? Special steps must be taken to access the port:  Before the port is accessed, a numbing cream can be placed on the skin. This helps numb the skin over the port site.  Your health care provider uses a sterile technique to access the port. ? Your health care provider must put on a mask and sterile gloves. ? The skin over your port is cleaned carefully with an antiseptic and allowed to dry. ? The port is gently pinched between sterile gloves, and a needle is inserted into the port.  Only "non-coring" port needles should be used to access the port. Once the port is accessed, a blood return should be checked. This helps ensure that the port  is in the vein and is not clogged.  If your port needs to remain accessed for a constant infusion, a clear (transparent) bandage will be placed over the needle site. The bandage and needle will need to be changed every week, or as directed by your health care provider.  Keep the bandage covering the needle clean and dry. Do not get it wet. Follow your health care provider's instructions on how to take a shower or bath while the port is accessed.  If your port does not need to stay accessed, no bandage is needed over the port.  What is flushing? Flushing helps keep the port from getting clogged. Follow your health care provider's instructions on how and when to flush the port. Ports are usually flushed with saline solution or a medicine called heparin. The need for flushing will depend on how the port is used.  If the port is used for intermittent medicines or blood draws, the port will need to be flushed: ? After medicines have been given. ? After blood has been drawn. ? As part of routine maintenance.  If a constant infusion is running, the port may not need to be flushed.  How long will my port stay implanted? The port can stay in for as long as your health care provider thinks it is needed. When it is time for the port to come out, surgery will be   done to remove it. The procedure is similar to the one performed when the port was put in. When should I seek immediate medical care? When you have an implanted port, you should seek immediate medical care if:  You notice a bad smell coming from the incision site.  You have swelling, redness, or drainage at the incision site.  You have more swelling or pain at the port site or the surrounding area.  You have a fever that is not controlled with medicine.  This information is not intended to replace advice given to you by your health care provider. Make sure you discuss any questions you have with your health care provider. Document  Released: 02/08/2005 Document Revised: 07/17/2015 Document Reviewed: 10/16/2012 Elsevier Interactive Patient Education  2017 [employer] Inc[employer]  

## 2017-08-29 NOTE — Progress Notes (Signed)
Completed on 08/29/2017 

## 2017-08-30 DIAGNOSIS — J309 Allergic rhinitis, unspecified: Secondary | ICD-10-CM | POA: Diagnosis not present

## 2017-09-06 DIAGNOSIS — J309 Allergic rhinitis, unspecified: Secondary | ICD-10-CM | POA: Diagnosis not present

## 2017-09-07 ENCOUNTER — Telehealth: Payer: Self-pay | Admitting: Genetics

## 2017-09-09 NOTE — Telephone Encounter (Addendum)
Revealed negative genetic test results.  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      We discussed variants of uncertain significance are variants for which there is insufficient evidence to classify as normal variation/benign or disease causing.  However, we discussed that the vast majority of VUS's (80-90%) are reclassified to benign/likely benign.  Therefore we do not recommend taking medical management action based on a VUS/uncertain variant. We discussed if these variants are ever reclassified in the future we will send out letter with any updates.   We discussed that this result is considred a negative result.  Therefore, we recommend medical management/screening, etc. Be based on personal/family history.  We would NOT recommend pursuing bilateral mastectomy based on this normal result.  We performed a Tyrer [place]us[place] risk assessment for her to estimate her lifetime breast cancer risk: this estimates her risk to be 7% Kari Green 8.8%) Therefore, she would not be considered high risk or need additional breast screening.     However, Kari Green expressed concerns regarding her dense breast tissue and the limited sensitivity of mammogram.  We discussed that while this is true, dense breast tissue is generally not enough indication on its own to warrant annual MRI.  However, if she still has concerns she can discuss additional screening with her physicians.  However, based solely on family history and genetic testing results, no additional screening is indicated.    Kari Green expressed she would like to be seen by Dr. Lindi Adie I high risk breast clinic again to discuss a new screening/management plan given these unexpected negative results.

## 2017-09-12 DIAGNOSIS — J309 Allergic rhinitis, unspecified: Secondary | ICD-10-CM | POA: Diagnosis not present

## 2017-09-15 DIAGNOSIS — Z09 Encounter for follow-up examination after completed treatment for conditions other than malignant neoplasm: Secondary | ICD-10-CM | POA: Diagnosis not present

## 2017-09-15 DIAGNOSIS — Z96612 Presence of left artificial shoulder joint: Secondary | ICD-10-CM | POA: Diagnosis not present

## 2017-09-15 DIAGNOSIS — M25512 Pain in left shoulder: Secondary | ICD-10-CM | POA: Diagnosis not present

## 2017-09-21 ENCOUNTER — Ambulatory Visit: Payer: Self-pay | Admitting: Genetics

## 2017-09-21 ENCOUNTER — Encounter: Payer: Self-pay | Admitting: Genetics

## 2017-09-21 DIAGNOSIS — Z8042 Family history of malignant neoplasm of prostate: Secondary | ICD-10-CM

## 2017-09-21 DIAGNOSIS — Z806 Family history of leukemia: Secondary | ICD-10-CM

## 2017-09-21 DIAGNOSIS — Z808 Family history of malignant neoplasm of other organs or systems: Secondary | ICD-10-CM

## 2017-09-21 DIAGNOSIS — Z803 Family history of malignant neoplasm of breast: Secondary | ICD-10-CM

## 2017-09-21 DIAGNOSIS — Z8041 Family history of malignant neoplasm of ovary: Secondary | ICD-10-CM

## 2017-09-21 DIAGNOSIS — Z1379 Encounter for other screening for genetic and chromosomal anomalies: Secondary | ICD-10-CM

## 2017-09-21 NOTE — Progress Notes (Signed)
HPI:  Ms. Bradstreet was previously seen in the [facility] clinic on 08/29/49.  Marland Kitchen Please refer to our prior cancer genetics clinic note for more information regarding Ms. Fadden's medical, social and family histories, and our assessment and recommendations, at the time. Ms. Blacksher recent genetic test results were disclosed to her, as well as recommendations warranted by these results. These results and recommendations are discussed in more detail below.  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  She reports she sees a dermatologist every 6 months.  Ms. Gurevich previously reported that she had [employer] direct to consumer genetic testing in December that showed a BRCA1 mutation.  She does not have the report from this testing for review. We performed confirmatory testing through a CLIA approved lab prior to any medical management action for this reported mutation.   FAMILY HISTORY:  We obtained a detailed, 4-generation family history.  Significant diagnoses are listed below: Family History  Problem Relation Age of Onset  . Anesthesia problems Mother   . Leukemia Mother 50  . Ovarian cancer Mother 1  . Cervical cancer Daughter        cervical  . Breast cancer Maternal Aunt        age dx unk  . Skin cancer Maternal Grandmother   . Throat cancer Maternal Grandfather   . Thyroid cancer Maternal Aunt   . Lung cancer Maternal Aunt   . Cervical cancer Maternal Aunt   . Cervical cancer Maternal Aunt   . Thyroid cancer Maternal Uncle   . Cancer Maternal Aunt        sinus  . Prostate cancer Other   . Brain cancer Cousin   . Other Cousin        brain tumor  . Leukemia Cousin   . Bone cancer Cousin   . Cervical cancer Cousin    Ms. Hamill has a 75 year-old daughter who has a history of endometriosis and cervical cancer. Ms. Boom has a brother who was killed at the age of 57.    Ms. Brents's father: 79, unk  health history Paternal Aunts/Uncles: limited information, 2 paternal aunts, 1 paternal aunle.  Some cancer is on this side, but details unk. One aunt had "gigantism" Paternal cousins: unk- some cancer unk details Paternal grandfather: unk Paternal grandmother: heart attack, possibly cancer  Ms. Morton's mother: 23, currently being treated for leukemia dx 4 years ago.  She has a mass on her pancreas, and has a history of ovarian cancer dx in her 53's.  Maternal Aunts/Uncles: 4 maternal uncles, 6 maternal aunts. 1 maternal uncle had thyroid cancer, the others might have had cancer- limited information.  -1 maternal aunt died of breast cancer.  -1 maternal aunt died of cervical cancer. -1 maternal aunt has a hx of sinus cancer -1 maternal aunt had thyroid cancer and lung cancer -1 maternal aunt had lung cancer -1 maternal aunt either had cervical or ovarian cancer.  Maternal cousins: 1 maternal cousin had leukemia, 2 maternal cousins had brain cancer, a couple of maternal cousins had cancerous cells frozen off their cervix, 1 maternal cousin had bone cancer.  Maternal grandfather: throat cancer. He had several siblings who all died of cancer.  Maternal grandmother:hx of skin cancer, died at 61. She had brothers who had prostate cancer, and brothers who had leukemia.   Ms. Neuzil is unaware of previous family history of genetic testing for hereditary cancer risks. Patient's  maternal ancestors are of Eastern/Western European/[place]rokee descent, and paternal ancestors are of Eastern/Western European descent. There is no reported Ashkenazi Jewish ancestry. There is no known consanguinity.  GENETIC TEST RESULTS: Genetic testing performed through [facility]'s Multi-Cancer Panel reported out on 09/06/2017 showed no pathogenic mutations.   The Multi-Cancer Panel offered by [facility] includes sequencing and/or deletion duplication testing of the following 83 genes: ALK, APC, ATM, AXIN2,BAP1,  BARD1,  BLM, BMPR1A, BRCA1, BRCA2, BRIP1, CASR, CDC73, CDH1, CDK4, CDKN1B, CDKN1C, CDKN2A (p14ARF), CDKN2A (p16INK4a), CEBPA, CHEK2, CTNNA1, DICER1, DIS3L2, EGFR (c.2369C>T, p.Thr790Met variant only), EPCAM (Deletion/duplication testing only), FH, FLCN, GATA2, GPC3, GREM1 (Promoter region deletion/duplication testing only), HOXB13 (c.251G>A, p.Gly84Glu), HRAS, KIT, MAX, MEN1, MET, MITF (c.952G>A, p.Glu318Lys variant only), MLH1, MSH2, MSH3, MSH6, MUTYH, NBN, NF1, NF2, NTHL1, PALB2, PDGFRA, PHOX2B, PMS2, POLD1, POLE, POT1, PRKAR1A, PTCH1, PTEN, RAD50, RAD51C, RAD51D, RB1, RECQL4, RET, RUNX1, SDHAF2, SDHA (sequence changes only), SDHB, SDHC, SDHD, SMAD4, SMARCA4, SMARCB1, SMARCE1, STK11, SUFU, TERC, TERT, TMEM127, TP53, TSC1, TSC2, VHL, WRN and WT1.   A variant of uncertain significance (VUS) in a gene called BRIP1 was also noted. c.1595T>C (p.Met532Thr).  A variant of uncertain significance (VUS) in a gene called PALB2 was also noted. c.2881C>A (p.Leu961Met).  The test report will be scanned into EPIC and will be located under the Molecular Pathology section of the Results Review tab. A portion of the result report is included below for reference.     We discussed with Ms. Lickteig that because current genetic testing is not perfect, it is possible there may be a gene mutation in one of these genes that current testing cannot detect, but that chance is small.  We also discussed, that there could be another gene that has not yet been discovered, or that we have not yet tested, that is responsible for the cancer diagnoses in the family. It is also possible there is a hereditary cause for the cancer in the family that Ms. Resendes did not inherit and therefore was not identified in her testing.  Therefore, it is important to remain in touch with cancer genetics in the future so that we can continue to offer Ms. Tindol the most up to date genetic testing.   Regarding the VUS's in [place] and BRIP1: At this time,  it is unknown if these variants are associated with increased cancer risk or if they are normal findings, but most variants such as these get reclassified to being inconsequential. They should not be used to make medical management decisions. With time, we suspect the lab will determine the significance of these variants, if any. If we do learn more about them, we will try to contact Ms. Ho to discuss it further. However, it is important to stay in touch with [place] periodically and keep the address and phone number up to date.  The large majority of VUS's are reclassified to benign/likely benign.   ADDITIONAL GENETIC TESTING: We discussed with Ms. Soltis that her genetic testing was fairly extensive.  If there are are genes identified to increase cancer risk that can be analyzed in the future, we would be happy to discuss and coordinate this testing at that time.    CANCER SCREENING RECOMMENDATIONS: Ms. Villamar test result is considered negative (normal).  This means that we have not identified a hereditary predisposition to cancer in her.     While reassuring, this does not definitively rule out a hereditary predisposition to cancer. It is still possible that there could be genetic mutations that are  undetectable by current technology, or genetic mutations in genes that have not been tested or identified to increase cancer risk.  Therefore, it is recommended she continue to follow the cancer management and screening guidelines provided by her oncology and primary healthcare providers.  Her family history may still contribute to her risk. An individual's cancer risk is not determined by genetic test results alone.  Overall cancer risk assessment includes additional factors such as personal medical history, family history, etc.  These should be used to make a personalized plan for cancer prevention and surveillance.    Tyrer [place]us[place] V8:   [place]: 8.8% estimated breast cancer risk.   We  discussed with Ms. Hermance that high risk breast screening (MRI and mammogram) are indicated when a woman's estimated breast cancer risk is 20% or higher- her risk is estimated by Baker Janus and Lilian Kapur to be 7-8.8%.   Therefore this high risk screening is not recommended.   Ms. Larsson expressed concern regarding her breast density (category D).  We discussed that this does reduce the sensitivity of mammogram and does impact breast cancer risk.  There are options for women with dense breasts  The high risk screening protocol is not typically recommend just based on having dense breast tissue.  However, there are options women with dense breast canc consider regarding their screening.  This is a conversation she can have with her physicians.  They can discuss the options for breast screening pros/cons, etc regarding high risk breast screening.  She requested to be seen by Dr. Lindi Adie in high risk breast clinic again to discuss her breast screening plan.   She understands we do NOT recommend risk reducing bilateral mastectomy for this result.   RECOMMENDATIONS FOR FAMILY MEMBERS:  Relatives in this family might be at some increased risk of developing cancer, over the general population risk, simply due to the family history of cancer.  We recommended women in this family have a yearly mammogram beginning at age 53, or 17 years younger than the earliest onset of cancer, an annual clinical breast exam, and perform monthly breast self-exams. Women in this family should also have a gynecological exam as recommended by their primary provider. All family members should have a colonoscopy by age 88 (or as directed by their doctors).  All family members should inform their physicians about the family history of cancer so their doctors can make the most appropriate screening recommendations for them.   It is also possible there is a hereditary cause for the cancer in Ms. Buss's family that she did not inherit  and therefore was not identified in her.   We recommended her maternal relatives also, have genetic counseling and testing. Ms. Fera will let us know if we can be of any assistance in coordinating genetic counseling and/or testing for these family members.   FOLLOW-UP: Lastly, we discussed with Ms. Diop that cancer genetics is a rapidly advancing field and it is possible that new genetic tests will be appropriate for her and/or her family members in the future. We encouraged her to remain in contact with cancer genetics on an annual basis so we can update her personal and family histories and let her know of advances in cancer genetics that may benefit this family.   Our contact number was provided. Ms. Kindig questions were answered to her satisfaction, and she knows she is welcome to call us at anytime with additional questions or concerns.   Ferol Luz, MS, [facility] Certified Genetic Counselor  [email or web address]

## 2017-09-23 ENCOUNTER — Telehealth: Payer: Self-pay | Admitting: *Deleted

## 2017-09-23 NOTE — Telephone Encounter (Signed)
This RN returned call to pt per her VM transferred from scheduling - with pt requesting an appointment with MD.  Per return call - obtained VM- message left to return call for further inquiry regarding appointment request.  Pt was seen for high risk by Dr Yvette Rack - and then was seen by genetic counselor. Unsure when pt will need to be seen.  This note will be sent to MD for further recommendations.

## 2017-09-26 NOTE — Telephone Encounter (Signed)
Ok to set her up for an appt in 1-2 weeks VG

## 2017-09-28 ENCOUNTER — Telehealth: Payer: Self-pay | Admitting: Hematology and Oncology

## 2017-09-28 NOTE — Telephone Encounter (Signed)
Spoke to patient regarding upcoming aug appts per 8/6 sch message  °

## 2017-10-02 ENCOUNTER — Encounter (HOSPITAL_BASED_OUTPATIENT_CLINIC_OR_DEPARTMENT_OTHER): Payer: Self-pay | Admitting: Emergency Medicine

## 2017-10-02 ENCOUNTER — Emergency Department (HOSPITAL_BASED_OUTPATIENT_CLINIC_OR_DEPARTMENT_OTHER)
Admission: EM | Admit: 2017-10-02 | Discharge: 2017-10-02 | Disposition: A | Discharge status: Home / Self Care | Payer: BLUE CROSS/BLUE SHIELD | Attending: Emergency Medicine | Admitting: Emergency Medicine

## 2017-10-02 ENCOUNTER — Other Ambulatory Visit: Payer: Self-pay

## 2017-10-02 DIAGNOSIS — N390 Urinary tract infection, site not specified: Secondary | ICD-10-CM | POA: Diagnosis not present

## 2017-10-02 DIAGNOSIS — Z96651 Presence of right artificial knee joint: Secondary | ICD-10-CM | POA: Diagnosis not present

## 2017-10-02 DIAGNOSIS — E039 Hypothyroidism, unspecified: Secondary | ICD-10-CM | POA: Insufficient documentation

## 2017-10-02 DIAGNOSIS — F1190 Opioid use, unspecified, uncomplicated: Secondary | ICD-10-CM | POA: Diagnosis not present

## 2017-10-02 DIAGNOSIS — Z91040 Latex allergy status: Secondary | ICD-10-CM | POA: Insufficient documentation

## 2017-10-02 DIAGNOSIS — Z9049 Acquired absence of other specified parts of digestive tract: Secondary | ICD-10-CM | POA: Diagnosis not present

## 2017-10-02 DIAGNOSIS — Z85828 Personal history of other malignant neoplasm of skin: Secondary | ICD-10-CM | POA: Diagnosis not present

## 2017-10-02 DIAGNOSIS — F17210 Nicotine dependence, cigarettes, uncomplicated: Secondary | ICD-10-CM | POA: Diagnosis not present

## 2017-10-02 DIAGNOSIS — Z79899 Other long term (current) drug therapy: Secondary | ICD-10-CM | POA: Diagnosis not present

## 2017-10-02 DIAGNOSIS — J45909 Unspecified asthma, uncomplicated: Secondary | ICD-10-CM | POA: Insufficient documentation

## 2017-10-02 DIAGNOSIS — Z91010 Allergy to peanuts: Secondary | ICD-10-CM | POA: Insufficient documentation

## 2017-10-02 DIAGNOSIS — Z96612 Presence of left artificial shoulder joint: Secondary | ICD-10-CM | POA: Diagnosis not present

## 2017-10-02 DIAGNOSIS — R319 Hematuria, unspecified: Secondary | ICD-10-CM | POA: Diagnosis not present

## 2017-10-02 DIAGNOSIS — F419 Anxiety disorder, unspecified: Secondary | ICD-10-CM | POA: Insufficient documentation

## 2017-10-02 LAB — CBC WITH DIFFERENTIAL/PLATELET
BASOS PCT: 0 %
Basophils Absolute: 0 10*3/uL (ref 0.0–0.1)
Eosinophils Absolute: 0.2 10*3/uL (ref 0.0–0.7)
Eosinophils Relative: 3 %
HCT: 36 % (ref 36.0–46.0)
Hemoglobin: 12 g/dL (ref 12.0–15.0)
LYMPHS ABS: 2 10*3/uL (ref 0.7–4.0)
Lymphocytes Relative: 28 %
MCH: 29.2 pg (ref 26.0–34.0)
MCHC: 33.3 g/dL (ref 30.0–36.0)
MCV: 87.6 fL (ref 78.0–100.0)
MONO ABS: 0.7 10*3/uL (ref 0.1–1.0)
MONOS PCT: 9 %
Neutro Abs: 4.4 10*3/uL (ref 1.7–7.7)
Neutrophils Relative %: 60 %
Platelets: 301 10*3/uL (ref 150–400)
RBC: 4.11 MIL/uL (ref 3.87–5.11)
RDW: 14.1 % (ref 11.5–15.5)
WBC: 7.3 10*3/uL (ref 4.0–10.5)

## 2017-10-02 LAB — URINALYSIS, MICROSCOPIC (REFLEX): RBC / HPF: >50 RBC/hpf (ref 0–5)

## 2017-10-02 LAB — URINALYSIS, ROUTINE W REFLEX MICROSCOPIC
BILIRUBIN URINE: NEGATIVE
Glucose, UA: NEGATIVE mg/dL
KETONES UR: NEGATIVE mg/dL
NITRITE: NEGATIVE
PROTEIN: NEGATIVE mg/dL
Specific Gravity, Urine: >1.03 — ABNORMAL HIGH (ref 1.005–1.030)
pH: 5.5 (ref 5.0–8.0)

## 2017-10-02 LAB — COMPREHENSIVE METABOLIC PANEL
ALBUMIN: 3.5 g/dL (ref 3.5–5.0)
ALT: 13 U/L (ref 0–44)
ANION GAP: 7 (ref 5–15)
AST: 26 U/L (ref 15–41)
Alkaline Phosphatase: 58 U/L (ref 38–126)
BILIRUBIN TOTAL: 0.6 mg/dL (ref 0.3–1.2)
BUN: 19 mg/dL (ref 6–20)
CO2: 25 mmol/L (ref 22–32)
Calcium: 8.5 mg/dL — ABNORMAL LOW (ref 8.9–10.3)
Chloride: 108 mmol/L (ref 98–111)
Creatinine, Ser: 0.84 mg/dL (ref 0.44–1.00)
Glucose, Bld: 85 mg/dL (ref 70–99)
Potassium: 3.3 mmol/L — ABNORMAL LOW (ref 3.5–5.1)
Sodium: 140 mmol/L (ref 135–145)
Total Protein: 6.6 g/dL (ref 6.5–8.1)

## 2017-10-02 LAB — LIPASE, BLOOD: LIPASE: 36 U/L (ref 11–51)

## 2017-10-02 MED ORDER — HEPARIN SOD (PORK) LOCK FLUSH 100 UNIT/ML IV SOLN
INTRAVENOUS | Status: AC
  Administered 2017-10-02: 500 [IU]
  Filled 2017-10-02: qty 5

## 2017-10-02 MED ORDER — CEPHALEXIN 500 MG PO CAPS: 500.0000 mg | ORAL_CAPSULE | Freq: Two times a day (BID) | ORAL | 0 refills | Status: AC

## 2017-10-02 MED ORDER — FOSFOMYCIN TROMETHAMINE 3 G PO PACK: 3.0000 g | PACK | Freq: Once | ORAL | 0 refills | Status: AC

## 2017-10-02 MED ORDER — KETOROLAC TROMETHAMINE 15 MG/ML IJ SOLN
15.0000 mg | Freq: Once | INTRAMUSCULAR | Status: AC
  Administered 2017-10-02: 15 mg via INTRAMUSCULAR
  Filled 2017-10-02: qty 1

## 2017-10-02 NOTE — Discharge Instructions (Addendum)
You were given a prescription for antibiotics to treat a urinary tract infection.  A culture was sent of your urine.  If results show that you need to change her antibiotic the hospital will contact you. Please take the antibiotic prescription fully.   Please follow-up with your your neurologist in regards to the blood in your urine today.  Please return to the emergency department for any new or worsening symptoms in the meantime.

## 2017-10-02 NOTE — ED Provider Notes (Signed)
[facility] EMERGENCY DEPARTMENT Provider Note   CSN: [id number] Arrival date & time: 10/02/17  1224     History   Chief Complaint Chief Complaint  Patient presents with  . Hematuria    HPI Kari Green is a 52 y.o. female.  HPI   Pt is a 52 y/o female with a h/o interstitial cystitis, who presents to the ED today c/o hematuria that began 3 days ago. States she has had bright red blood in her urine with clots. Reports associated aching pain to her right flank and cramping abdominal pain. Denies dysuria, frequency, urgency. States pain is worse with movement and walking. Reports sweats, chills, nausea, diarrhea, but denies vomiting, constipation. States that she had one episode of blood in her stool that was bright red 3 days ago. She has had no further episodes since then. Has a h/o hemorrhoids.  Pt states she has had hematuria intermittently for the last several years and has followed up with urology, nephrology, and ob-gyn and she states that no one has been able to figured out why she has been having hematuria. Pt also states that she has been diagnosed with kidney stones in the past.   Past Medical History:  Diagnosis Date  . Anemia    Slight  . Anxiety   . Arthritis    right thumb  . Asthma   . Avascular necrosis (HCC)    left shoulder, right knee, jaws/mouth that she knows of at this time  . Basal cell carcinoma   . Chronic migraine without aura, intractable, with status migrainosus    neurologist-  dr Jaynee Eagles--  receives botox injections every 3 months  . Chronic, continuous use of opioids   . Complication of anesthesia    "not asleep and aware of everything"  . Degenerative disc disease, cervical   . Degenerative disc disease, lumbar   . Delayed gastric emptying   . Difficult intravenous access    Now has Powerport for access  . Difficulty swallowing pills   . Esophageal dysmotility   . Family history of adverse reaction to anesthesia    family -  itching, hives, PONV  . Family history of breast cancer 08/29/2017  . Family history of leukemia 08/29/2017  . Family history of ovarian cancer 08/29/2017  . Family history of prostate cancer 08/29/2017  . Family history of thyroid cancer 08/29/2017  . Fibromyalgia   . Hemangioma of liver   . Hemangioma, renal   . Hiatal hernia   . History of exercise stress test 05/08/2015   negative stress test without evidence of ischemia at given workload  . History of kidney stones 04/2014  . History of skin cancer    excision melanoma - scalp;  excision behind knee  . IC (interstitial cystitis)    urologist-  dr Louis Meckel  . Irritable bowel syndrome (IBS)   . Knee pain, right    states gets "nerves burned" periodically  . Limited joint range of motion    right knee - unable to fully extend right leg  . Melanoma ([place])   . Memory impairment    due to pain   . Osteoporosis   . Pain syndrome, chronic   . Pneumonia    history of X2  . PONV (postoperative nausea and vomiting)    uncontrollable itching with anesthesia; hx. of anesthesia awareness; pt request needs 50  mg of Benadryl IV/phenergan 50 mg IV  . Port-A-Cath in place 11/04/2016  . Post-surgical hypothyroidism   .  Sciatica    bilateral  . Seasonal allergies   . Squamous carcinoma     Patient Active Problem List   Diagnosis Date Noted  . Genetic testing 09/21/2017  . Family history of breast cancer 08/29/2017  . Family history of leukemia 08/29/2017  . Family history of thyroid cancer 08/29/2017  . Family history of ovarian cancer 08/29/2017  . Family history of prostate cancer 08/29/2017  . Severe nausea following anesthesia 07/05/2017    Class: History of  . Port-A-Cath in place 04/26/2017  . Avascular necrosis of bone ([place]) 02/16/2017  . Burn injury 01/03/2017  . Current smoker 12/27/2016  . History of bisphosphonate therapy 12/27/2016  . 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 Medication  monitoring encounter 12/27/2016  . History of revision of total knee replacement (Right) 12/27/2016  . Cancer ([place])   . Intractable chronic migraine without aura and with status migrainosus 07/25/2016  . Venereal wart 07/21/2016  . Vaginal mass 07/21/2016  . Chronic knee pain after total replacement of knee joint (Right) 07/21/2016  . Nasal ulcer 07/05/2016  . S/P shoulder hemiarthroplasty (Left) 12/04/2015  . Hemoptysis 10/16/2015  . Cough 10/16/2015  . Radiculopathy 07/16/2015  . Cervical lymphadenitis 05/29/2015  . Muscle cramps at night 05/06/2015  . Temporomandibular joint disorder 04/10/2015  . Buzzing in ear 04/10/2015  . Ear pain, referred 04/10/2015  . Idiopathic osteoporosis 04/10/2015  . 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/10/2015  . History of migraine headaches 04/10/2015  . Difficulty hearing 04/10/2015  . H/O respiratory system disease 04/10/2015  . Fibromyalgia 04/10/2015  . Deflected nasal septum 04/10/2015  . Chronic otitis externa 04/10/2015  . Abnormal auditory perception 04/10/2015  . Hearing loss 04/10/2015  . Asthma 03/25/2015  . History of allergy to IVP dye 01/31/2015  . Chronic low back pain 01/13/2015  . CRPS (complex regional pain syndrome) type I of lower limb (Right) 01/13/2015  . History of arthroplasty of knee (Right) 01/13/2015  . Chronic pain syndrome 01/13/2015  . Chronic shoulder pain (Left) 01/13/2015  . Chronic knee pain (Secondary Area of Pain) (Right) 01/13/2015  . Generalized anxiety disorder 01/13/2015  . Chronic lower extremity pain (Primary Area of Pain) (Right) 01/13/2015  . Multiple drug allergies (codeine, Lidoderm a DC if, sulfa, latex, IVP dye, Keflex, Augmentin) 01/13/2015  . Latex allergy 01/13/2015  . Long term current use of opiate analgesic 01/13/2015  . Long term prescription opiate use 01/13/2015  . Opiate use 01/13/2015  . Chronic, continuous use of opioids 01/13/2015  . Moderate persistent asthma 11/15/2014  . Allergic  rhinitis 11/15/2014  . Memory deficits 10/24/2013  . 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 Hypothyroidism 09/21/2012  . Abdominal adhesions 02/11/2012  . Abdominal pain, epigastric 12/15/2011  . Irritable bowel syndrome with constipation 02/26/2011    Past Surgical History:  Procedure Laterality Date  . ANTERIOR CERVICAL DECOMP/DISCECTOMY FUSION  03/10/2009   C5-6, C6-7  . ANTERIOR CERVICAL DECOMP/DISCECTOMY FUSION N/A 07/16/2015   Procedure: ANTERIOR CERVICAL DECOMPRESSION FUSION 4-5 WITH INSTRUMENTATION AND AUTOGRAFT;  Surgeon: Phylliss Bob, MD;  Location: [facility];  Service: Orthopedics;  Laterality: N/A;  ANTERIOR CERVICAL DECOMPRESSION FUSION 4-5 WITH INSTRUMENTATION AND ALLOGRAFT  . APPENDECTOMY  ~ 1992  . AUGMENTATION MAMMAPLASTY Bilateral 1996; 2001, 2016  . BREAST SURGERY  age 7   accessory breast tissue exc  . CARPAL TUNNEL RELEASE Bilateral right 07/26/2001;  left ?  [place] Right 02/10/2015   Procedure: RIGHT THUMB SUSPENSION  ARTHROPLASTY;  Surgeon: Milly Jakob, MD;  Location: [facility];  Service: Orthopedics;  Laterality: Right;  ANESTHESIA: PRE- OP BLOCK  . CYSTO WITH HYDRODISTENSION  01/29/2011   Procedure: CYSTOSCOPY/HYDRODISTENSION;  Surgeon: Claybon Jabs, MD;  Location[facility];  Service: Urology;  Laterality: N/A;  . CYSTO WITH HYDRODISTENSION N/A 05/08/2014   Procedure: CYSTOSCOPY/HYDRODISTENSION WITH BILATERAL RETROGRADES;  Surgeon: Ardis Hughs, MD;  Location: [facility];  Service: Urology;  Laterality: N/A;  . CYSTO WITH HYDRODISTENSION N/A 03/27/2015   Procedure: CYSTOSCOPY/HYDRODISTENSION OF BLADDER, INSTILLATION OF MARCAINE/PYRIDIUM;  Surgeon: Ardis Hughs, MD;  Location: [facility];  Service: Urology;  Laterality: N/A;  . CYSTO/  BLADDER BX  03/08/2008  . CYSTO/ URETHRAL DILATION/ HYDRODISTENTION/ BLADDER BX  04/10/2003  . CYSTOSCOPY W/ RETROGRADES   01/29/2011   Procedure: CYSTOSCOPY WITH RETROGRADE PYELOGRAM;  Surgeon: Claybon Jabs, MD;  Location: [facility];  Service: Urology;  Laterality: Bilateral;  . CYSTOSCOPY WITH HYDRODISTENSION AND BIOPSY N/A 06/29/2017   Procedure: CYSTOSCOPY/HYDRODISTENSION;  Surgeon: Ardis Hughs, MD;  Location: [facility];  Service: Urology;  Laterality: N/A;  . CYSTOSCOPY WITH STENT PLACEMENT  2006   URETERAL  . DEBRIDEMENT RIGHT THUMB MP JOINT Right 04/10/2002  . DECOMPRESSION LEFT ULNAR NERVE, ELBOW Left 01/29/2003   2nd time in 2015  . ESOPHAGOGASTRODUODENOSCOPY (EGD) WITH ESOPHAGEAL DILATION  10-26-2000  . EUS N/A 04/23/2016   Procedure: UPPER ENDOSCOPIC ULTRASOUND (EUS) LINEAR;  Surgeon: Carol Ada, MD;  Location: [facility];  Service: Endoscopy;  Laterality: N/A;  . EXCISION HAGLUND'S DEFORMITY WITH ACHILLES TENDON REPAIR Bilateral 2013-2014  . FISSURECTOMY  ~ 1988 x2  . GANGLION CYST EXCISION Right 02/10/2015   Procedure: REMOVAL GANGLION OF WRIST, PARTIAL ULNAR EXCISION;  Surgeon: Milly Jakob, MD;  Location: [facility];  Service: Orthopedics;  Laterality: Right;  . IR FLUORO GUIDE PORT INSERTION RIGHT  11/04/2016  . IR US GUIDE VASC ACCESS RIGHT  11/04/2016  . KIDNEY STONE SURGERY    . KNEE ARTHROSCOPY Right 08/16/2000  . KNEE ARTHROSCOPY WITH FULKERSON SLIDE Right 11/29/2000  . LAPAROSCOPIC ASSISTED VAGINAL HYSTERECTOMY  Feb 1999  . LAPAROSCOPIC CHOLECYSTECTOMY  03/04/2002  . LAPAROSCOPIC LYSIS OF ADHESIONS  02/11/2012   Procedure: LAPAROSCOPIC LYSIS OF ADHESIONS;  Surgeon: Cheri Fowler, MD;  Location: [facility];  Service: Gynecology;;  . LAPAROSCOPY  02/11/2012   Procedure: LAPAROSCOPY DIAGNOSTIC;  Surgeon: Cheri Fowler, MD;  Location: [facility];  Service: Gynecology;  Laterality: N/A;  Diagnostic  Operative Laparoscopic   . LAPAROSCOPY ADHESIOLYSIS AND REMOVAL POSSIBLE RIGHT OVARY REMNANT  04/18/2000  . LAPAROSCOPY LYSIS ADHESIONS/  BILATERAL  SALPINGOOPHORECTOMY/  [place] OF ENDOMETRIOSIS  1998  . MELANOMA EXCISION Left 2013   "behind knee"   . NASAL SINUS SURGERY  2013  . NEGATIVE SLEEP STUDY  2006 approx .  per pt  . REFRACTIVE SURGERY Bilateral ~ 1999; ~ 2013  . RIGHT THUMB FUSION OF MPJ Right 10/03/2002  . RIGHT TOTAL KNEE REVISION ARTHROPLASTY Right 03/14/2007  . SHOULDER ARTHROSCOPY Left    put back in place  . SHOULDER HEMI-ARTHROPLASTY Left 12/04/2015   Procedure: SHOULDER HEMI-ARTHROPLASTY;  Surgeon: Tania Ade, MD;  Location: [facility];  Service: Orthopedics;  Laterality: Left;  Left shoulder hemiarthroplasty  . sympathetic nerve ablation  07/05/2017  . TONSILLECTOMY AND ADENOIDECTOMY  ~ 1989  . TOTAL KNEE ARTHROPLASTY Right 09/10/2003; 12/08/2012  . TOTAL THYROIDECTOMY  1993  . TRANSANAL HEMORRHOIDAL DEARTERIALIZATION  03/26/2011  .  TRANSTHORACIC ECHOCARDIOGRAM  09/22/2012  . TRIGGER FINGER RELEASE Left 07/06/2016   Procedure: LEFT THUMB TRIGGER RELEASE;  Surgeon: Milly Jakob, MD;  Location: [facility];  Service: Orthopedics;  Laterality: Left;  Marland Kitchen VIDEO BRONCHOSCOPY Bilateral 10/20/2015   Procedure: VIDEO BRONCHOSCOPY WITHOUT FLUORO;  Surgeon: Collene Gobble, MD;  Location: [facility];  Service: Cardiopulmonary;  Laterality: Bilateral;     OB History    Gravida  0   Para  0   Term  0   Preterm  0   AB  0   Living        SAB  0   TAB  0   Ectopic  0   Multiple      Live Births               Home Medications    Prior to Admission medications   Medication Sig Start Date End Date Taking? Authorizing Provider  ALPRAZolam Duanne Moron) 0.5 MG tablet Take 1 mg by mouth 3 (three) times daily as needed. Anxiety    [provider]  cephALEXin (KEFLEX) 500 MG capsule Take 1 capsule (500 mg total) by mouth 2 (two) times daily for 7 days. 10/02/17 10/09/17  Joeanna Howdyshell S, PA-C  cyanocobalamin (,VITAMIN B-12,) 1000 MCG/ML injection Inject 1,000 mcg into the muscle once a  week. Reported on 03/25/2015    [provider]  diphenhydrAMINE (BENADRYL) 50 MG tablet Take 50 mg by mouth every 4 (four) hours as needed for itching.     [provider]  Erenumab-aooe (AIMOVIG [place]) Inject into the skin every 30 (thirty) days.    [provider]  estradiol (ESTRACE) 0.5 MG tablet Take 1 mg by mouth at bedtime.     [provider]  EVZIO 0.4 MG/0.4ML SOAJ AUTO INJECT AS NEEDED IN CASE OF EMERGENCY WHERE GRANDCHILDREN MAY GET A HOLD OF PATIENTS PAIN MEDICATIONS. 11/14/14   [provider]  fluticasone (FLONASE) 50 MCG/ACT nasal spray SPRAY 2 SPRAYS INTO EACH NOSTRIL DAILY 12/15/16   Bobbitt, [place] Muta, MD  gabapentin (NEURONTIN) 300 MG capsule Take 300 mg by mouth 3 (three) times daily. 03/15/16   [provider]  HYDROmorphone (DILAUDID) 2 MG tablet Take 2 mg by mouth every 4 (four) hours as needed for severe pain.    [provider]  hydrOXYzine (ATARAX/VISTARIL) 50 MG tablet Take 50 mg by mouth every 4 (four) hours as needed for itching. Alternates with benadryl    [provider]  ketoconazole (NIZORAL) 2 % shampoo One application once a month as needed for flaking 11/19/14   [provider]  levothyroxine (SYNTHROID, LEVOTHROID) 50 MCG tablet Take 200 mcg by mouth daily. Pt can only take name brand    [provider]  Multiple Vitamin (MULTIVITAMIN WITH MINERALS) TABS tablet Take 1 tablet by mouth at bedtime.    [provider]  OnabotulinumtoxinA (BOTOX IJ) Inject as directed every 3 (three) months. For migraines    [provider]  promethazine (PHENERGAN) 25 MG tablet Take 1-2 tablets (25-50 mg total) by mouth 2 (two) times daily as needed for nausea or vomiting. 08/24/16   Law, Bea Graff, PA-C  propranolol (INDERAL) 20 MG tablet Take 40 mg by mouth daily.  05/12/15   [provider]  SUMAtriptan (IMITREX) 6 MG/0.5ML SOLN injection Inject 0.5 mLs (6 mg total) into  the skin every 2 (two) hours as needed for migraine or headache. can take again in 2hrs.Max twice in  24hrs 07/11/17   Melvenia Beam, MD  tiZANidine (ZANAFLEX) 4 MG tablet Take 4 mg by mouth 3 (three) times daily.     [provider]  topiramate (TOPAMAX) 25 MG tablet Take 200 mg by mouth 2 (two) times daily.  04/15/15   [provider]  vitamin C (ASCORBIC ACID) 500 MG tablet Take 1,000 mg by mouth daily.    [provider]  zolpidem (AMBIEN) 10 MG tablet Take 10 mg by mouth at bedtime as needed. 05/08/17   [provider]    Family History Family History  Problem Relation Age of Onset  . Anesthesia problems Mother   . Leukemia Mother 24  . Ovarian cancer Mother 44  . Cervical cancer Daughter        cervical  . Breast cancer Maternal Aunt        age dx unk  . Skin cancer Maternal Grandmother   . Throat cancer Maternal Grandfather   . Thyroid cancer Maternal Aunt   . Lung cancer Maternal Aunt   . Cervical cancer Maternal Aunt   . Cervical cancer Maternal Aunt   . Thyroid cancer Maternal Uncle   . Cancer Maternal Aunt        sinus  . Prostate cancer Other   . Brain cancer Cousin   . Other Cousin        brain tumor  . Leukemia Cousin   . Bone cancer Cousin   . Cervical cancer Cousin     Social History Social History   Tobacco Use  . Smoking status: Light Tobacco Smoker    Types: Cigarettes  . Smokeless tobacco: Never Used  . Tobacco comment: occ  Substance Use Topics  . Alcohol use: Yes    Alcohol/week: 0.0 standard drinks    Comment: rarely  . Drug use: No     Allergies   [place]; Latex; Nickel; Peanuts [peanut oil]; Red dye; Azithromycin; Chloraprep one step [chlorhexidine gluconate]; Corticosteroids; Flexeril [cyclobenzaprine]; Morphine and related; Other; Percocet [oxycodone-acetaminophen]; Supartz [sodium hyaluronate (avian)]; Surgical lubricant; Yellow dyes (non-tartrazine); Cephalexin; Monistat [miconazole]; Prednisone; Sulfa  antibiotics; Yellow dye; Soap; and Tape   Review of Systems Review of Systems  Constitutional: Positive for chills and diaphoresis. Negative for fever.  HENT: Negative for sore throat.   Eyes: Negative for visual disturbance.  Respiratory: Negative for shortness of breath.   Cardiovascular: Negative for chest pain.  Gastrointestinal: Positive for abdominal pain, blood in stool (resolved), diarrhea and nausea. Negative for constipation and vomiting.  Genitourinary: Positive for flank pain and hematuria. Negative for dysuria, frequency, urgency and vaginal bleeding.  Musculoskeletal: Negative for back pain.  Skin: Negative for rash.  Neurological: Negative for headaches.    Physical Exam Updated Vital Signs BP 98/75 (BP Location: Right Arm)   Pulse 71   Temp 98 F (36.7 C) (Oral)   Resp 18   Ht 5\' 3"  (1.6 m)   Wt 53.1 kg   SpO2 100%   BMI 20.73 kg/m   Physical Exam  Constitutional: She appears well-developed and well-nourished. No distress.  Pt is well appearing  HENT:  Head: Normocephalic and atraumatic.  Eyes: Conjunctivae are normal.  Neck: Neck supple.  Cardiovascular: Normal rate, regular rhythm, normal heart sounds and intact distal pulses.  No murmur heard. Pulmonary/Chest: Effort normal and breath sounds normal. No stridor. No respiratory distress. She has no wheezes.  Abdominal: Soft. Bowel sounds are normal.  Generalized TTP that is mild, no grimace, no guarding, no rebound, no  rigidity. Right sided CVA TTP.   Musculoskeletal: She exhibits no edema.  Neurological: She is alert.  Skin: Skin is warm and dry. Capillary refill takes less than 2 seconds.  Psychiatric: She has a normal mood and affect.  Nursing note and vitals reviewed.  ED Treatments / Results  Labs (all labs ordered are listed, but only abnormal results are displayed) Labs Reviewed  URINALYSIS, ROUTINE W REFLEX MICROSCOPIC - Abnormal; Notable for the following components:      Result Value    APPearance CLOUDY (*)    Specific Gravity, Urine >1.030 (*)    Hgb urine dipstick LARGE (*)    Leukocytes, UA MODERATE (*)    All other components within normal limits  URINALYSIS, MICROSCOPIC (REFLEX) - Abnormal; Notable for the following components:   Bacteria, UA MANY (*)    All other components within normal limits  COMPREHENSIVE METABOLIC PANEL - Abnormal; Notable for the following components:   Potassium 3.3 (*)    Calcium 8.5 (*)    All other components within normal limits  URINE CULTURE  CBC WITH DIFFERENTIAL/PLATELET  LIPASE, BLOOD    EKG None  Radiology No results found.  Procedures Procedures (including critical care time)  Medications Ordered in ED Medications  heparin lock flush 100 UNIT/ML injection (has no administration in time range)  ketorolac (TORADOL) 15 MG/ML injection 15 mg (15 mg Intramuscular Given 10/02/17 1432)     Initial Impression / Assessment and Plan / ED Course  I have reviewed the triage vital signs and the nursing notes.  Pertinent labs & imaging results that were available during my care of the patient were reviewed by me and considered in my medical decision making (see chart for details).   Offered CT imaging to rule out renal stone and patient declined stating that she is trying to reduce her exposure to radiation.  Offered ultrasound imaging of the kidneys and patient also declined stating that she wants to get out of here as soon as possible.  Final Clinical Impressions(s) / ED Diagnoses   Final diagnoses:  Hematuria, unspecified type  Urinary tract infection with hematuria, site unspecified   Patient presenting with complaints of hematuria, right flank pain and abdominal cramping.  Vitals stable, afebrile.  No vomiting or other systemic symptoms.  Does have right CVA tenderness on exam.  She refused imaging to rule out nephrolithiasis or hydroureter.  Labs show normal kidney function.  No leukocytosis.  UA with evidence of urinary  tract infection.  Culture sent given her reports of frequent UTIs and history of interstitial cystitis.  She reports a long history of hematuria and has had cystoscopies in the past.  Advised her to follow-up with her urologist in regards to her episode of hematuria and to return to the ER if she has any new or worsening symptoms in the meantime.  Patient voices understanding of plan and reasons to return immediately to the ED.  All questions answered.   ED Discharge Orders         Ordered    cephALEXin (KEFLEX) 500 MG capsule  2 times daily     10/02/17 1541           Rosaleah Person S, PA-C 10/02/17 1545    Davonna Belling, MD 10/02/17 1616

## 2017-10-02 NOTE — ED Triage Notes (Signed)
Patient states that she started to have blood in her urine on Friday - she reports that she is also having pain to her right back.

## 2017-10-02 NOTE — ED Notes (Signed)
ED Provider at bedside. 

## 2017-10-03 ENCOUNTER — Inpatient Hospital Stay: Payer: BLUE CROSS/BLUE SHIELD | Attending: Hematology and Oncology | Admitting: Hematology and Oncology

## 2017-10-03 DIAGNOSIS — Z79899 Other long term (current) drug therapy: Secondary | ICD-10-CM | POA: Insufficient documentation

## 2017-10-03 DIAGNOSIS — Z1501 Genetic susceptibility to malignant neoplasm of breast: Secondary | ICD-10-CM

## 2017-10-03 DIAGNOSIS — M797 Fibromyalgia: Secondary | ICD-10-CM | POA: Diagnosis not present

## 2017-10-03 DIAGNOSIS — Z1502 Genetic susceptibility to malignant neoplasm of ovary: Secondary | ICD-10-CM | POA: Diagnosis not present

## 2017-10-03 DIAGNOSIS — Z803 Family history of malignant neoplasm of breast: Secondary | ICD-10-CM | POA: Diagnosis not present

## 2017-10-03 DIAGNOSIS — R310 Gross hematuria: Secondary | ICD-10-CM | POA: Diagnosis not present

## 2017-10-03 DIAGNOSIS — R319 Hematuria, unspecified: Secondary | ICD-10-CM

## 2017-10-03 DIAGNOSIS — G43009 Migraine without aura, not intractable, without status migrainosus: Secondary | ICD-10-CM | POA: Diagnosis not present

## 2017-10-03 DIAGNOSIS — Z1509 Genetic susceptibility to other malignant neoplasm: Secondary | ICD-10-CM | POA: Diagnosis not present

## 2017-10-03 DIAGNOSIS — N390 Urinary tract infection, site not specified: Secondary | ICD-10-CM | POA: Diagnosis not present

## 2017-10-03 DIAGNOSIS — Z5181 Encounter for therapeutic drug level monitoring: Secondary | ICD-10-CM | POA: Diagnosis not present

## 2017-10-03 NOTE — Progress Notes (Signed)
Patient Care Team: Jani Gravel, MD as PCP - General (Internal Medicine) Teena Irani, MD (Inactive) as Consulting Physician (Gastroenterology) Kathie Rhodes, MD as Consulting Physician (Urology) Tania Ade, MD as Consulting Physician (Orthopedic Surgery)  DIAGNOSIS:  Encounter Diagnosis  Name Primary?  . Family history of breast cancer    CHIEF COMPLIANT: Patient is here to discuss the results of genetic testing  INTERVAL HISTORY: Kari Green is a 52 year old who reported to me that she had a BRCA mutation on 23 and me blood test.  She had complete genetic testing which did not show any mutations.  She is here today to discuss the results of the genetic testing as well as to talk about having bilateral mastectomies.  She has an appointment to see Dr. Donne Hazel next week.  REVIEW OF SYSTEMS:   Constitutional: Denies fevers, chills or abnormal weight loss Eyes: Denies blurriness of vision Ears, nose, mouth, throat, and face: Denies mucositis or sore throat Respiratory: Denies cough, dyspnea or wheezes Cardiovascular: Denies palpitation, chest discomfort Gastrointestinal:  Denies nausea, heartburn or change in bowel habits Skin: Denies abnormal skin rashes Lymphatics: Denies new lymphadenopathy or easy bruising Neurological:Denies numbness, tingling or new weaknesses Behavioral/Psych: Mood is stable, no new changes  Extremities: No lower extremity edema   All other systems were reviewed with the patient and are negative.  I have reviewed the past medical history, past surgical history, social history and family history with the patient and they are unchanged from previous note.  ALLERGIES:  is allergic to cobalt; latex; nickel; peanuts [peanut oil]; red dye; azithromycin; chloraprep one step [chlorhexidine gluconate]; corticosteroids; flexeril [cyclobenzaprine]; morphine and related; other; percocet [oxycodone-acetaminophen]; supartz [sodium hyaluronate (avian)]; surgical  lubricant; yellow dyes (non-tartrazine); cephalexin; monistat [miconazole]; prednisone; sulfa antibiotics; yellow dye; soap; and tape.  MEDICATIONS:  Current Outpatient Medications  Medication Sig Dispense Refill  . ALPRAZolam (XANAX) 0.5 MG tablet Take 1 mg by mouth 3 (three) times daily as needed. Anxiety    . cephALEXin (KEFLEX) 500 MG capsule Take 1 capsule (500 mg total) by mouth 2 (two) times daily for 7 days. 14 capsule 0  . cyanocobalamin (,VITAMIN B-12,) 1000 MCG/ML injection Inject 1,000 mcg into the muscle once a week. Reported on 03/25/2015    . diphenhydrAMINE (BENADRYL) 50 MG tablet Take 50 mg by mouth every 4 (four) hours as needed for itching.     Eduard Roux (AIMOVIG [place]) Inject into the skin every 30 (thirty) days.    Marland Kitchen estradiol (ESTRACE) 0.5 MG tablet Take 1 mg by mouth at bedtime.     Marland Kitchen EVZIO 0.4 MG/0.4ML SOAJ AUTO INJECT AS NEEDED IN CASE OF EMERGENCY WHERE GRANDCHILDREN MAY GET A HOLD OF PATIENTS PAIN MEDICATIONS.  0  . fluticasone (FLONASE) 50 MCG/ACT nasal spray SPRAY 2 SPRAYS INTO EACH NOSTRIL DAILY 16 g 0  . gabapentin (NEURONTIN) 300 MG capsule Take 300 mg by mouth 3 (three) times daily.    Marland Kitchen HYDROmorphone (DILAUDID) 2 MG tablet Take 2 mg by mouth every 4 (four) hours as needed for severe pain.    . hydrOXYzine (ATARAX/VISTARIL) 50 MG tablet Take 50 mg by mouth every 4 (four) hours as needed for itching. Alternates with benadryl    . ketoconazole (NIZORAL) 2 % shampoo One application once a month as needed for flaking  3  . levothyroxine (SYNTHROID, LEVOTHROID) 50 MCG tablet Take 200 mcg by mouth daily. Pt can only take name brand    . Multiple Vitamin (MULTIVITAMIN WITH MINERALS) TABS tablet  Take 1 tablet by mouth at bedtime.    . OnabotulinumtoxinA (BOTOX IJ) Inject as directed every 3 (three) months. For migraines    . promethazine (PHENERGAN) 25 MG tablet Take 1-2 tablets (25-50 mg total) by mouth 2 (two) times daily as needed for nausea or vomiting. 20 tablet 0    . propranolol (INDERAL) 20 MG tablet Take 40 mg by mouth daily.     . SUMAtriptan (IMITREX) 6 MG/0.5ML SOLN injection Inject 0.5 mLs (6 mg total) into the skin every 2 (two) hours as needed for migraine or headache. can take again in 2hrs.Max twice in 24hrs 12 vial 11  . tiZANidine (ZANAFLEX) 4 MG tablet Take 4 mg by mouth 3 (three) times daily.     Marland Kitchen topiramate (TOPAMAX) 25 MG tablet Take 200 mg by mouth 2 (two) times daily.   1  . vitamin C (ASCORBIC ACID) 500 MG tablet Take 1,000 mg by mouth daily.    Marland Kitchen zolpidem (AMBIEN) 10 MG tablet Take 10 mg by mouth at bedtime as needed.     No current facility-administered medications for this visit.     PHYSICAL EXAMINATION: ECOG PERFORMANCE STATUS: 1 - Symptomatic but completely ambulatory  Vitals:   10/03/17 1010  BP: (!) 86/64  Pulse: 76  Resp: 17  Temp: (!) 97.5 F (36.4 C)  SpO2: 97%   Filed Weights   10/03/17 1010  Weight: 122 lb 14.4 oz (55.7 kg)    GENERAL:alert, no distress and comfortable SKIN: skin color, texture, turgor are normal, no rashes or significant lesions EYES: normal, Conjunctiva are pink and non-injected, sclera clear OROPHARYNX:no exudate, no erythema and lips, buccal mucosa, and tongue normal  NECK: supple, thyroid normal size, non-tender, without nodularity LYMPH:  no palpable lymphadenopathy in the cervical, axillary or inguinal LUNGS: clear to auscultation and percussion with normal breathing effort HEART: regular rate & rhythm and no murmurs and no lower extremity edema ABDOMEN:abdomen soft, non-tender and normal bowel sounds MUSCULOSKELETAL:no cyanosis of digits and no clubbing  NEURO: alert & oriented x 3 with fluent speech, no focal motor/sensory deficits EXTREMITIES: No lower extremity edema    LABORATORY DATA:  I have reviewed the data as listed CMP Latest Ref Rng & Units 10/02/2017 03/29/2016 12/18/2015  Glucose 70 - 99 mg/dL 85 86 88  BUN 6 - 20 mg/dL _0 Creatinine 0.44 - 1.00 mg/dL 0.84  0.86 0.81  Sodium 135 - 145 mmol/L 140 141 142  Potassium 3.5 - 5.1 mmol/L 3.3(L) 3.4(L) 3.5  Chloride 98 - 111 mmol/L 108 111 111  CO2 22 - 32 mmol/L _1 Calcium 8.9 - 10.3 mg/dL 8.5(L) 9.2 8.8(L)  Total Protein 6.5 - 8.1 g/dL 6.6 - -  Total Bilirubin 0.3 - 1.2 mg/dL 0.6 - -  Alkaline Phos 38 - 126 U/L 58 - -  AST 15 - 41 U/L 26 - -  ALT 0 - 44 U/L 13 - -    Lab Results  Component Value Date   WBC 7.3 10/02/2017   HGB 12.0 10/02/2017   HCT 36.0 10/02/2017   MCV 87.6 10/02/2017   PLT 301 10/02/2017   NEUTROABS 4.4 10/02/2017    ASSESSMENT & PLAN:  Family history of breast cancer Patient had 23 and me test apparently showing BRCA1 mutation. We performed complete genetic testing and did not identify any BRCA mutations. Tyra Cusick version 8 revealed that she has a lifetime risk of 7%.  Which is fairly similar to the  average population risk.  Based on these findings I did not recommend high risk breast cancer surveillance with MRIs.  I also did not recommend bilateral mastectomies for breast reduction. However patient insists that she wants to have these procedures done because of her extensive family history.  She will discuss with Dr. Donne Hazel and see what he thinks.  Complains of blood in the urine: I instructed her to follow-up with her urologist.  She also has a gynecologist.  She had hysterectomy previously.  Return to clinic on an as-needed basis.  No orders of the defined types were placed in this encounter.  The patient has a good understanding of the overall plan. she agrees with it. she will call with any problems that may develop before the next visit here.   Harriette Ohara, MD 10/03/17

## 2017-10-03 NOTE — Assessment & Plan Note (Signed)
Patient had 23 and me test apparently showing BRCA1 mutation. We performed complete genetic testing and did not identify any BRCA mutations. Kari Green version 8 revealed that she has a lifetime risk of 7%.  Which is fairly similar to the average population risk.  Based on these findings I did not recommend high risk breast cancer surveillance with MRIs.  I also did not recommend bilateral mastectomies for breast reduction.  Return to clinic on an as-needed basis.

## 2017-10-05 LAB — URINE CULTURE

## 2017-10-06 ENCOUNTER — Telehealth: Payer: Self-pay | Admitting: *Deleted

## 2017-10-06 DIAGNOSIS — R109 Unspecified abdominal pain: Secondary | ICD-10-CM | POA: Insufficient documentation

## 2017-10-06 DIAGNOSIS — N200 Calculus of kidney: Secondary | ICD-10-CM | POA: Diagnosis not present

## 2017-10-06 DIAGNOSIS — Z87442 Personal history of urinary calculi: Secondary | ICD-10-CM | POA: Insufficient documentation

## 2017-10-06 DIAGNOSIS — C801 Malignant (primary) neoplasm, unspecified: Secondary | ICD-10-CM | POA: Insufficient documentation

## 2017-10-06 DIAGNOSIS — N3011 Interstitial cystitis (chronic) with hematuria: Secondary | ICD-10-CM | POA: Insufficient documentation

## 2017-10-06 DIAGNOSIS — R310 Gross hematuria: Secondary | ICD-10-CM | POA: Insufficient documentation

## 2017-10-06 DIAGNOSIS — R828 Abnormal findings on cytological and histological examination of urine: Secondary | ICD-10-CM | POA: Diagnosis not present

## 2017-10-06 DIAGNOSIS — R10A1 Flank pain, right side: Secondary | ICD-10-CM | POA: Insufficient documentation

## 2017-10-06 NOTE — Progress Notes (Signed)
ED Antimicrobial Stewardship Positive Culture Follow Up   Kari Green is an 52 y.o. female who presented to [facility] on 10/02/2017 with a chief complaint of blood in urine and back pain.  Chief Complaint  Patient presents with  . Hematuria    Recent Results (from the past 720 hour(s))  Urine culture     Status: Abnormal   Collection Time: 10/02/17 12:52 PM  Result Value Ref Range Status   Specimen Description   Final    URINE, RANDOM Performed at [facility], [address] [place], [place] [address]    Special Requests   Final    NONE Performed at [facility], [address] [place], [place] [address]    Culture >=100,000 COLONIES/mL ENTEROCOCCUS FAECALIS (A)  Final   Report Status 10/05/2017 FINAL  Final   Organism ID, Bacteria ENTEROCOCCUS FAECALIS (A)  Final      Susceptibility   Enterococcus faecalis - MIC*    AMPICILLIN <=2 SENSITIVE Sensitive     LEVOFLOXACIN 0.5 SENSITIVE Sensitive     NITROFURANTOIN <=16 SENSITIVE Sensitive     VANCOMYCIN 1 SENSITIVE Sensitive     * >=100,000 COLONIES/mL ENTEROCOCCUS FAECALIS    Treated with cephalexin; prescribed antimicrobial not effective against organism. Will advise patient to discontinue cephalexin and begin taking amoxicillin.  New antibiotic prescription: Amoxicillin 500 mg PO TID for 7 days.  ED Provider: Domenic Moras, PA   Jahmeer Porche Lessie Dings 10/06/2017, 9:21 AM Pharmacy Resident Monday - Friday phone -  [phone] Saturday - Sunday phone - [phone]

## 2017-10-06 NOTE — Telephone Encounter (Signed)
Post ED Visit - Positive Culture Follow-up: Successful Patient Follow-Up  Culture assessed and recommendations reviewed by:  []  Elenor Quinones, Pharm.D. []  Heide Guile, Pharm.D., BCPS AQ-ID []  Parks Neptune, Pharm.D., BCPS []  Alycia Rossetti, Pharm.D., BCPS []  [place], Pharm.D., BCPS, AAHIVP []  Legrand Como, Pharm.D., BCPS, AAHIVP []  Salome Arnt, PharmD, BCPS []  Johnnette Gourd, PharmD, BCPS []  Hughes Better, PharmD, BCPS []  Leeroy Cha, PharmD  Positive urine culture  []  Patient discharged without antimicrobial prescription and treatment is now indicated []  Organism is resistant to prescribed ED discharge antimicrobial []  Patient with positive blood cultures Additional treatment is needed  Changes discussed with ED provider: Domenic Moras, PA-C New antibiotic prescription Amoxicillin 500mg  TID x 7 days (liquid suspension requested by patient) Called to [place], [place], [place]  Contacted patient, date 10/06/2017, time [place], [place] 10/06/2017, 9:30 AM

## 2017-10-13 ENCOUNTER — Ambulatory Visit (INDEPENDENT_AMBULATORY_CARE_PROVIDER_SITE_OTHER): Payer: BLUE CROSS/BLUE SHIELD | Admitting: Neurology

## 2017-10-13 DIAGNOSIS — G43711 Chronic migraine without aura, intractable, with status migrainosus: Secondary | ICD-10-CM

## 2017-10-13 NOTE — Progress Notes (Signed)
Interval history 07/11/2017:  + masseters, + temples, + eyes, did not out in traps used the extra on the forehead, lateral corrugators, end of brow  She is 75% better as far as frequency and severity of migraines.    Interval history 04/05/2017: She has shingles today and she is in pain in her right hip. She has had shinges multiple times. She has had the flu as well and she was in the bed sick and she had some headaches. Her baseline is daily migraines and since her botox (this is her third shot) she has only had one very bad migraine, tremendous improvement. She has a headache today but she is sick.     Consent Form Botulism Toxin Injection For Chronic Migraine  Botulism toxin has been approved by the Federal drug administration for treatment of chronic migraine. Botulism toxin does not cure chronic migraine and it may not be effective in some patients.  The administration of botulism toxin is accomplished by injecting a small amount of toxin into the muscles of the neck and head. Dosage must be titrated for each individual. Any benefits resulting from botulism toxin tend to wear off after 3 months with a repeat injection required if benefit is to be maintained. Injections are usually done every 3-4 months with maximum effect peak achieved by about 2 or 3 weeks. Botulism toxin is expensive and you should be sure of what costs you will incur resulting from the injection.  The side effects of botulism toxin use for chronic migraine may include:   -Transient, and usually mild, facial weakness with facial injections  -Transient, and usually mild, head or neck weakness with head/neck injections  -Reduction or loss of forehead facial animation due to forehead muscle              weakness  -Eyelid drooping  -Dry eye  -Pain at the site of injection or bruising at the site of injection  -Double vision  -Potential unknown long term risks  Contraindications: You should not have Botox if you are  pregnant, nursing, allergic to albumin, have an infection, skin condition, or muscle weakness at the site of the injection, or have myasthenia gravis, Lambert-Eaton syndrome, or ALS.  It is also possible that as with any injection, there may be an allergic reaction or no effect from the medication. Reduced effectiveness after repeated injections is sometimes seen and rarely infection at the injection site may occur. All care will be taken to prevent these side effects. If therapy is given over a long time, atrophy and wasting in the muscle injected may occur. Occasionally the patient's become refractory to treatment because they develop antibodies to the toxin. In this event, therapy needs to be modified.  I have read the above information and consent to the administration of botulism toxin.    ______________  _____   _________________  Patient signature     Date   Witness signature       BOTOX PROCEDURE NOTE FOR MIGRAINE HEADACHE    Contraindications and precautions discussed with patient(above). Aseptic procedure was observed and patient tolerated procedure. Procedure performed by Dr. Georgia Dom  The condition has existed for more than 6 months, and pt does not have a diagnosis of ALS, Myasthenia Gravis or Lambert-Eaton Syndrome. Risks and benefits of injections discussed and pt agrees to proceed with the procedure. Written consent obtained  These injections are medically necessary. He receives good benefits from these injections. These injections do not cause  sedations or hallucinations which the oral therapies may cause.  Indication/Diagnosis: chronic migraine BOTOX(J0585) injection was performed according to protocol by [employer]. 200 units of BOTOX was dissolved into 4 cc NS.  NDC: [address]-17  Type of toxin: Botox  Botox- 100 units x 2 vials Lot: [id number] Expiration: 09/2019 NDC: 5916-3846-65  Bacteriostatic 0.9% Sodium Chloride- 37mL total Lot: [id number] Expiration:  06/23/2018 NDC: 1779-3903-00  Description of procedure:  The patient was placed in a sitting position. The standard protocol was used for Botox as follows, with 5 units of Botox injected at each site:   -Procerus muscle, midline injection  -Corrugator muscle, bilateral injection  -Frontalis muscle, bilateral injection, with 2 sites each side, medial injection was performed in the upper one third of the frontalis muscle, in the region vertical from the medial inferior edge of the superior orbital rim. The lateral injection was again in the upper one third of the forehead vertically above the lateral limbus of the cornea, 1.5 cm lateral to the medial injection site.  -Temporalis muscle injection, 4 sites, bilaterally. The first injection was 3 cm above the tragus of the ear, second injection site was 1.5 cm to 3 cm up from the first injection site in line with the tragus of the ear. The third injection site was 1.5-3 cm forward between the first 2 injection sites. The fourth injection site was 1.5 cm posterior to the second injection site.  -Occipitalis muscle injection, 3 sites, bilaterally. The first injection was done one half way between the occipital protuberance and the tip of the mastoid process behind the ear. The second injection site was done lateral and superior to the first, 1 fingerbreadth from the first injection. The third injection site was 1 fingerbreadth superiorly and medially from the first injection site.  -Cervical paraspinal muscle injection, 2 sites, bilateral knee first injection site was 1 cm from the midline of the cervical spine, 3 cm inferior to the lower border of the occipital protuberance. The second injection site was 1.5 cm superiorly and laterally to the first injection site.  -Trapezius muscle injection was performed at 3 sites, bilaterally. The first injection site was in the upper trapezius muscle halfway between the inflection point of the neck, and the  acromion. The second injection site was one half way between the acromion and the first injection site. The third injection was done between the first injection site and the inflection point of the neck.   Will return for repeat injection in 3 months.   A 200 unit sof Botox was used, 155 units were injected, the rest of the Botox was wasted. The patient tolerated the procedure well, there were no complications of the above procedure.

## 2017-10-13 NOTE — Progress Notes (Signed)
Botox- 100 units x 2 vials Lot: [id number] Expiration: 04/2020 NDC: 4033-5331-74  Bacteriostatic 0.9% Sodium Chloride- 75mL total Lot: [id number] Expiration: 11/23/2018 NDC: 0044-7158-06  Dx: B86.854 S/P

## 2017-10-20 ENCOUNTER — Ambulatory Visit: Payer: BLUE CROSS/BLUE SHIELD | Admitting: Pain Medicine

## 2017-10-25 ENCOUNTER — Encounter: Payer: Self-pay | Admitting: Pain Medicine

## 2017-10-25 ENCOUNTER — Ambulatory Visit (HOSPITAL_BASED_OUTPATIENT_CLINIC_OR_DEPARTMENT_OTHER): Payer: BLUE CROSS/BLUE SHIELD | Admitting: Pain Medicine

## 2017-10-25 ENCOUNTER — Ambulatory Visit
Admission: RE | Admit: 2017-10-25 | Discharge: 2017-10-25 | Disposition: A | Discharge status: Home / Self Care | Payer: BLUE CROSS/BLUE SHIELD | Source: Ambulatory Visit | Attending: Pain Medicine | Admitting: Pain Medicine

## 2017-10-25 ENCOUNTER — Other Ambulatory Visit: Payer: Self-pay

## 2017-10-25 VITALS — BP 111/80 | HR 74 | Temp 97.6°F | Resp 12 | Ht 63.0 in | Wt 124.0 lb

## 2017-10-25 DIAGNOSIS — Z881 Allergy status to other antibiotic agents status: Secondary | ICD-10-CM | POA: Insufficient documentation

## 2017-10-25 DIAGNOSIS — Z79899 Other long term (current) drug therapy: Secondary | ICD-10-CM | POA: Diagnosis not present

## 2017-10-25 DIAGNOSIS — Z882 Allergy status to sulfonamides status: Secondary | ICD-10-CM | POA: Insufficient documentation

## 2017-10-25 DIAGNOSIS — Z91040 Latex allergy status: Secondary | ICD-10-CM | POA: Insufficient documentation

## 2017-10-25 DIAGNOSIS — T84093S Other mechanical complication of internal left knee prosthesis, sequela: Secondary | ICD-10-CM

## 2017-10-25 DIAGNOSIS — Z8719 Personal history of other diseases of the digestive system: Secondary | ICD-10-CM | POA: Diagnosis not present

## 2017-10-25 DIAGNOSIS — M25561 Pain in right knee: Secondary | ICD-10-CM | POA: Insufficient documentation

## 2017-10-25 DIAGNOSIS — Z888 Allergy status to other drugs, medicaments and biological substances status: Secondary | ICD-10-CM | POA: Diagnosis not present

## 2017-10-25 DIAGNOSIS — Z96651 Presence of right artificial knee joint: Secondary | ICD-10-CM

## 2017-10-25 DIAGNOSIS — Z79890 Hormone replacement therapy (postmenopausal): Secondary | ICD-10-CM | POA: Insufficient documentation

## 2017-10-25 DIAGNOSIS — Z85820 Personal history of malignant melanoma of skin: Secondary | ICD-10-CM | POA: Diagnosis not present

## 2017-10-25 DIAGNOSIS — Z885 Allergy status to narcotic agent status: Secondary | ICD-10-CM | POA: Insufficient documentation

## 2017-10-25 DIAGNOSIS — Z889 Allergy status to unspecified drugs, medicaments and biological substances status: Secondary | ICD-10-CM

## 2017-10-25 DIAGNOSIS — Z96653 Presence of artificial knee joint, bilateral: Secondary | ICD-10-CM | POA: Insufficient documentation

## 2017-10-25 DIAGNOSIS — M79604 Pain in right leg: Secondary | ICD-10-CM

## 2017-10-25 DIAGNOSIS — Z95828 Presence of other vascular implants and grafts: Secondary | ICD-10-CM

## 2017-10-25 DIAGNOSIS — G90521 Complex regional pain syndrome I of right lower limb: Secondary | ICD-10-CM

## 2017-10-25 DIAGNOSIS — G8929 Other chronic pain: Secondary | ICD-10-CM | POA: Diagnosis not present

## 2017-10-25 DIAGNOSIS — R110 Nausea: Secondary | ICD-10-CM

## 2017-10-25 DIAGNOSIS — Z883 Allergy status to other anti-infective agents status: Secondary | ICD-10-CM | POA: Insufficient documentation

## 2017-10-25 DIAGNOSIS — Z9889 Other specified postprocedural states: Secondary | ICD-10-CM

## 2017-10-25 DIAGNOSIS — Y838 Other surgical procedures as the cause of abnormal reaction of the patient, or of later complication, without mention of misadventure at the time of the procedure: Secondary | ICD-10-CM | POA: Insufficient documentation

## 2017-10-25 DIAGNOSIS — G8918 Other acute postprocedural pain: Secondary | ICD-10-CM | POA: Insufficient documentation

## 2017-10-25 MED ORDER — CEFAZOLIN SODIUM 1 G IJ SOLR
INTRAMUSCULAR | Status: AC
  Filled 2017-10-25: qty 10

## 2017-10-25 MED ORDER — LIDOCAINE HCL 2 % IJ SOLN
20.0000 mL | Freq: Once | INTRAMUSCULAR | Status: AC
  Administered 2017-10-25: 400 mg
  Filled 2017-10-25: qty 40

## 2017-10-25 MED ORDER — HYDROMORPHONE HCL 2 MG PO TABS: 2.0000 mg | ORAL_TABLET | Freq: Three times a day (TID) | ORAL | 0 refills | Status: DC | PRN

## 2017-10-25 MED ORDER — LACTATED RINGERS IV SOLN
1000.0000 mL | Freq: Once | INTRAVENOUS | Status: AC
  Administered 2017-10-25: 1000 mL via INTRAVENOUS

## 2017-10-25 MED ORDER — PROMETHAZINE HCL 25 MG/ML IJ SOLN
12.5000 mg | INTRAMUSCULAR | Status: DC | PRN
  Administered 2017-10-25: 12.5 mg via INTRAVENOUS
  Filled 2017-10-25 (×3): qty 1

## 2017-10-25 MED ORDER — FENTANYL CITRATE (PF) 100 MCG/2ML IJ SOLN
25.0000 ug | INTRAMUSCULAR | Status: DC | PRN
  Administered 2017-10-25: 100 ug via INTRAVENOUS
  Filled 2017-10-25: qty 2

## 2017-10-25 MED ORDER — DIPHENHYDRAMINE HCL 50 MG/ML IJ SOLN
50.0000 mg | Freq: Once | INTRAMUSCULAR | Status: AC
  Administered 2017-10-25: 50 mg via INTRAVENOUS
  Filled 2017-10-25: qty 1

## 2017-10-25 MED ORDER — ROPIVACAINE HCL 2 MG/ML IJ SOLN
9.0000 mL | Freq: Once | INTRAMUSCULAR | Status: AC
  Administered 2017-10-25: 10 mL
  Filled 2017-10-25: qty 10

## 2017-10-25 MED ORDER — SODIUM CHLORIDE 0.9% FLUSH
10.0000 mL | INTRAVENOUS | Status: DC | PRN
  Administered 2017-10-25: 10 mL
  Filled 2017-10-25: qty 10

## 2017-10-25 MED ORDER — MIDAZOLAM HCL 5 MG/5ML IJ SOLN
1.0000 mg | INTRAMUSCULAR | Status: DC | PRN
  Administered 2017-10-25: 4 mg via INTRAVENOUS
  Filled 2017-10-25: qty 5

## 2017-10-25 MED ORDER — HEPARIN SOD (PORK) LOCK FLUSH 100 UNIT/ML IV SOLN
500.0000 [IU] | Freq: Once | INTRAVENOUS | Status: DC
  Filled 2017-10-25: qty 5

## 2017-10-25 MED ORDER — HEPARIN SOD (PORK) LOCK FLUSH 10 UNIT/ML IV SOLN
5.0000 mL | Freq: Once | INTRAVENOUS | Status: DC
  Filled 2017-10-25 (×2): qty 5

## 2017-10-25 MED ORDER — CEFAZOLIN SODIUM-DEXTROSE 1-4 GM/50ML-% IV SOLN
1.0000 g | Freq: Once | INTRAVENOUS | Status: AC
  Administered 2017-10-25: 1 g via INTRAVENOUS

## 2017-10-25 NOTE — Patient Instructions (Addendum)
__You have been given a prescription for Hydromorphone 2mg  tablet tol ast for 7 days as needed for pain. _________________________________________________________________________________________  Post-Radiofrequency (RF) Discharge Instructions  You have just completed a Radiofrequency Neurotomy.  The following instructions will provide you with information and guidelines for self-care upon discharge.  If at any time you have questions or concerns please call your physician. DO NOT DRIVE YOURSELF!!  Instructions:  Apply ice: Fill a plastic sandwich bag with crushed ice. Cover it with a small towel and apply to injection site. Apply for 15 minutes then remove x 15 minutes. Repeat sequence on day of procedure, until you go to bed. The purpose is to minimize swelling and discomfort after procedure.  Apply heat: Apply heat to procedure site starting the day following the procedure. The purpose is to treat any soreness and discomfort from the procedure.  Food intake: No eating limitations, unless stipulated above.  Nevertheless, if you have had sedation, you may experience some nausea.  In this case, it may be wise to wait at least two hours prior to resuming regular diet.  Physical activities: Keep activities to a minimum for the first 8 hours after the procedure. For the first 24 hours after the procedure, do not drive a motor vehicle,  Operate heavy machinery, power tools, or handle any weapons.  Consider walking with the use of an assistive device or accompanied by an adult for the first 24 hours.  Do not drink alcoholic beverages including beer.  Do not make any important decisions or sign any legal documents. Go home and rest today.  Resume activities tomorrow, as tolerated.  Use caution in moving about as you may experience mild leg weakness.  Use caution in cooking, use of household electrical appliances and climbing steps.  Driving: If you have received any sedation, you are not allowed to drive  for 24 hours after your procedure.  Blood thinner: Restart your blood thinner 6 hours after your procedure. (Only for those taking blood thinners)  Insulin: As soon as you can eat, you may resume your normal dosing schedule. (Only for those taking insulin)  Medications: May resume pre-procedure medications.  Do not take any drugs, other than what has been prescribed to you.  Infection prevention: Keep procedure site clean and dry.  Post-procedure Pain Diary: Extremely important that this be done correctly and accurately. Recorded information will be used to determine the next step in treatment.  Pain evaluated is that of treated area only. Do not include pain from an untreated area.  Complete every hour, on the hour, for the initial 8 hours. Set an alarm to help you do this part accurately.  Do not go to sleep and have it completed later. It will not be accurate.  Follow-up appointment: Keep your follow-up appointment after the procedure. Usually 2 weeks for most procedures. (6 weeks in the case of radiofrequency.) Bring you pain diary.   Expect:  From numbing medicine (AKA: Local Anesthetics): Numbness or decrease in pain.  Onset: Full effect within 15 minutes of injected.  Duration: It will depend on the type of local anesthetic used. On the average, 1 to 8 hours.   From steroids: Decrease in swelling or inflammation. Once inflammation is improved, relief of the pain will follow.  Onset of benefits: Depends on the amount of swelling present. The more swelling, the longer it will take for the benefits to be seen. In some cases, up to 10 days.  Duration: Steroids will stay in the system  x 2 weeks. Duration of benefits will depend on multiple posibilities including persistent irritating factors.  From procedure: Some discomfort is to be expected once the numbing medicine wears off. This should be minimal if ice and heat are applied as instructed.  Call if:  You experience  numbness and weakness that gets worse with time, as opposed to wearing off.  He experience any unusual bleeding, difficulty breathing, or loss of the ability to control your bowel and bladder. (This applies to Spinal procedures only)  You experience any redness, swelling, heat, red streaks, elevated temperature, fever, or any other signs of a possible infection.  Emergency Numbers:  [facility] business hours (Monday - Thursday, 8:00 AM - 4:00 PM) (Friday, 9:00 AM - 12:00 Noon): [phone]  After hours: [phone] ____________________________________________________________________________________________

## 2017-10-25 NOTE — Progress Notes (Addendum)
Patient's Name: Kari Green  MRN: [id number]  Referring Provider: Milinda Pointer, MD  DOB: 07-25-1965  PCP: Jani Gravel, MD  DOS: 10/25/2017  Note by: Gaspar Cola, MD  Service setting: Ambulatory outpatient  Specialty: Interventional Pain Management  Patient type: Established  Location: [facility] (AMB) Pain Management Facility  Visit type: Interventional Procedure   Primary Reason for Visit: Interventional Pain Management Treatment. CC: Knee Pain (right side is worse)  Procedure:          Anesthesia, Analgesia, Anxiolysis:  Type: Palliative Superior-lateral, Superior-medial, and Inferior-medial, Genicular Nerve Radiofrequency Ablation. #3  Region: Lateral, Anterior, and Medial aspects of the knee joint, above and below the knee joint proper. Level: Superior and inferior to the knee joint. Laterality: Right  Type: Moderate (Conscious) Sedation combined with Local Anesthesia Indication(s): Analgesia and Anxiety Route: Intravenous (IV) IV Access: Secured Sedation: Meaningful verbal contact was maintained at all times during the procedure  Local Anesthetic: Lidocaine 1-2%   Indications: 1. 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 Chronic knee pain (Secondary Area of Pain) (Right)   3. Failed total left knee replacement, sequela   4. History of arthroplasty of knee (Right)   5. History of revision of total knee replacement (Right)    Kari Green has been dealing with the above chronic pain for longer than three months and has either failed to respond, was unable to tolerate, or simply did not get enough benefit from other more conservative therapies including, but not limited to: 1. Over-the-counter medications 2. Anti-inflammatory medications 3. Muscle relaxants 4. Membrane stabilizers 5. Opioids 6. Physical therapy 7. Modalities (Heat, ice, etc.) 8. Invasive techniques such as nerve blocks. Kari Green has attained more than 50% relief of  the pain from a series of diagnostic injections conducted in separate occasions.  Pain Score: Pre-procedure: 9 /10 Post-procedure: 0-No pain/10  Pre-op Assessment:  Kari Green is a 52 y.o. (year old), female patient, seen today for interventional treatment. She  has a past surgical history that includes Total thyroidectomy (1993); cysto with hydrodistension (01/29/2011); Cystoscopy w/ retrogrades (01/29/2011); Fissurectomy (~ 1988 x2); Refractive surgery (Bilateral, ~ 1999; ~ 2013); Nasal sinus surgery (2013); Laparoscopic lysis of adhesions (02/11/2012); Excision haglund's deformity with achilles tendon repair (Bilateral, 2013-2014); Melanoma excision (Left, 2013); Cystoscopy with stent placement (2006); Appendectomy (~ 1992); Tonsillectomy and adenoidectomy (~ 1989); LAPAROSCOPY ADHESIOLYSIS AND REMOVAL POSSIBLE RIGHT OVARY REMNANT (04/18/2000); Knee arthroscopy (Right, 08/16/2000); Carpal tunnel release (Bilateral, right 07/26/2001;  left ?); DEBRIDEMENT RIGHT THUMB MP JOINT (Right, 04/10/2002); RIGHT THUMB FUSION OF MPJ (Right, 10/03/2002); DECOMPRESSION LEFT ULNAR NERVE, ELBOW (Left, 01/29/2003); RIGHT TOTAL KNEE REVISION ARTHROPLASTY (Right, 03/14/2007); CYSTO/ URETHRAL DILATION/ HYDRODISTENTION/ BLADDER BX (04/10/2003); Laparoscopic assisted vaginal hysterectomy (Feb 1999); LAPAROSCOPY LYSIS ADHESIONS/  BILATERAL SALPINGOOPHORECTOMY/  FULGERATION OF ENDOMETRIOSIS (1998); Anterior cervical decomp/discectomy fusion (03/10/2009); Esophagogastroduodenoscopy (egd) with esophageal dilation (10-26-2000); CYSTO/  BLADDER BX (03/08/2008); transthoracic echocardiogram (09/22/2012); cysto with hydrodistension (N/A, 05/08/2014); laparoscopy (02/11/2012); Transanal hemorrhoidal dearterialization (03/26/2011); Knee arthroscopy with fulkerson slide (Right, 11/29/2000); Laparoscopic cholecystectomy (03/04/2002); Total knee arthroplasty (Right, 09/10/2003; 12/08/2012); Carpometacarpel [facility]) suspension plasty (Right, 02/10/2015);  Ganglion cyst excision (Right, 02/10/2015); NEGATIVE SLEEP STUDY (2006 approx .  per pt); cysto with hydrodistension (N/A, 03/27/2015); Kidney stone surgery; Anterior cervical decomp/discectomy fusion (N/A, 07/16/2015); Video bronchoscopy (Bilateral, 10/20/2015); Shoulder hemi-arthroplasty (Left, 12/04/2015); EUS (N/A, 04/23/2016); Trigger finger release (Left, 07/06/2016); IR FLUORO GUIDE PORT INSERTION RIGHT (11/04/2016); IR US Guide Vasc Access Right (11/04/2016); Shoulder arthroscopy (Left); Augmentation mammaplasty (Bilateral,  1996; 2001, 2016); Breast surgery (age 24); Cystoscopy with hydrodistension and biopsy (N/A, 06/29/2017); and sympathetic nerve ablation (07/05/2017). Kari Green has a current medication list which includes the following prescription(s): alprazolam, cyanocobalamin, diphenhydramine, estradiol, evzio, fluticasone, gabapentin, hydromorphone, hydroxyzine, ketoconazole, levothyroxine, multivitamin with minerals, onabotulinumtoxina, promethazine, sumatriptan, tizanidine, topiramate, vitamin c, zolpidem, and hydromorphone, and the following Facility-Administered Medications: fentanyl, heparin flush, heparin lock flush, midazolam, promethazine, and sodium chloride flush. Her primarily concern today is the Knee Pain (right side is worse)  Initial Vital Signs:  Pulse/HCG Rate: (!) 101ECG Heart Rate: 85 Temp: 98.2 F (36.8 C) Resp: 16 BP: 114/76 SpO2: 95 %  BMI: Estimated body mass index is 21.97 kg/m as calculated from the following:   Height as of this encounter: 5\' 3"  (1.6 m).   Weight as of this encounter: 124 lb (56.2 kg).  Risk Assessment: Allergies: Reviewed. She is allergic to cobalt; latex; nickel; peanuts [peanut oil]; red dye; azithromycin; chloraprep one step [chlorhexidine gluconate]; corticosteroids; flexeril [cyclobenzaprine]; morphine and related; other; percocet [oxycodone-acetaminophen]; supartz [sodium hyaluronate (avian)]; surgical lubricant; yellow dyes (non-tartrazine);  cephalexin; monistat [miconazole]; prednisone; sulfa antibiotics; yellow dye; soap; and tape.  Allergy Precautions: Latex-free protocol activated Coagulopathies: Reviewed. None identified.  Blood-thinner therapy: None at this time Active Infection(s): Reviewed. None identified. Kari Green is afebrile  Site Confirmation: Kari Green was asked to confirm the procedure and laterality before marking the site Procedure checklist: Completed Consent: Before the procedure and under the influence of no sedative(s), amnesic(s), or anxiolytics, the patient was informed of the treatment options, risks and possible complications. To fulfill our ethical and legal obligations, as recommended by the American Medical Association's Code of Ethics, I have informed the patient of my clinical impression; the nature and purpose of the treatment or procedure; the risks, benefits, and possible complications of the intervention; the alternatives, including doing nothing; the risk(s) and benefit(s) of the alternative treatment(s) or procedure(s); and the risk(s) and benefit(s) of doing nothing. The patient was provided information about the general risks and possible complications associated with the procedure. These may include, but are not limited to: failure to achieve desired goals, infection, bleeding, organ or nerve damage, allergic reactions, paralysis, and death. In addition, the patient was informed of those risks and complications associated to the procedure, such as failure to decrease pain; infection; bleeding; organ or nerve damage with subsequent damage to sensory, motor, and/or autonomic systems, resulting in permanent pain, numbness, and/or weakness of one or several areas of the body; allergic reactions; (i.e.: anaphylactic reaction); and/or death. Furthermore, the patient was informed of those risks and complications associated with the medications. These include, but are not limited to: allergic reactions  (i.e.: anaphylactic or anaphylactoid reaction(s)); adrenal axis suppression; blood sugar elevation that in diabetics may result in ketoacidosis or comma; water retention that in patients with history of congestive heart failure may result in shortness of breath, pulmonary edema, and decompensation with resultant heart failure; weight gain; swelling or edema; medication-induced neural toxicity; particulate matter embolism and blood vessel occlusion with resultant organ, and/or nervous system infarction; and/or aseptic necrosis of one or more joints. Finally, the patient was informed that Medicine is not an exact science; therefore, there is also the possibility of unforeseen or unpredictable risks and/or possible complications that may result in a catastrophic outcome. The patient indicated having understood very clearly. We have given the patient no guarantees and we have made no promises. Enough time was given to the patient to ask questions, all of which were  answered to the patient's satisfaction. Kari Green has indicated that she wanted to continue with the procedure. Attestation: I, the ordering provider, attest that I have discussed with the patient the benefits, risks, side-effects, alternatives, likelihood of achieving goals, and potential problems during recovery for the procedure that I have provided informed consent. Date  Time: 10/25/2017  1:54 PM  Pre-Procedure Preparation:  Monitoring: As per clinic protocol. Respiration, ETCO2, SpO2, BP, heart rate and rhythm monitor placed and checked for adequate function Safety Precautions: Patient was assessed for positional comfort and pressure points before starting the procedure. Time-out: I initiated and conducted the "Time-out" before starting the procedure, as per protocol. The patient was asked to participate by confirming the accuracy of the "Time Out" information. Verification of the correct person, site, and procedure were performed and  confirmed by me, the nursing staff, and the patient. "Time-out" conducted as per Joint Commission's Universal Protocol (UP.01.01.01). Time: 3570  Description of Procedure:          Position: Supine Target Area: For Genicular Nerve block(s), the targets are: the superior-lateral genicular nerve, located in the lateral distal portion of the femoral shaft as it curves to form the lateral epicondyle, in the region of the distal femoral metaphysis; the superior-medial genicular nerve, located in the medial distal portion of the femoral shaft as it curves to form the medial epicondyle; and the inferior-medial genicular nerve, located in the medial, proximal portion of the tibial shaft, as it curves to form the medial epicondyle, in the region of the proximal tibial metaphysis. Approach: Anterior, ipsilateral approach. Area Prepped: Entire knee area, from mid-thigh to mid-shin, lateral, anterior, and medial aspects. Prepping solution: Hibiclens (4.0% Chlorhexidine gluconate solution) Safety Precautions: Aspiration looking for blood return was conducted prior to all injections. At no point did we inject any substances, as a needle was being advanced. No attempts were made at seeking any paresthesias. Safe injection practices and needle disposal techniques used. Medications properly checked for expiration dates. SDV (single dose vial) medications used. Description of the Procedure: Protocol guidelines were followed. The patient was placed in position over the procedure table. The target area was identified and the area prepped in the usual manner. The skin and muscle were infiltrated with local anesthetic. Appropriate amount of time allowed to pass for local anesthetics to take effect. Radiofrequency needles were introduced to the target area using fluoroscopic guidance. Using the NeuroTherm NT1100 Radiofrequency Generator, sensory stimulation using 50 Hz was used to locate & identify the nerve, making sure that  the needle was positioned such that there was no sensory stimulation below 0.3 V or above 0.7 V. Stimulation using 2 Hz was used to evaluate the motor component. Care was taken not to lesion any nerves that demonstrated motor stimulation of the lower extremities at an output of less than 2.5 times that of the sensory threshold, or a maximum of 2.0 V. Once satisfactory placement of the needles was achieved, the numbing solution was slowly injected after negative aspiration. After waiting for at least 2 minutes, the ablation was performed at 80 degrees C for 60 seconds, using regular Radiofrequency settings. Once the procedure was completed, the needles were then removed and the area cleansed, making sure to leave some of the prepping solution back to take advantage of its long term bactericidal properties. Intra-operative Compliance: Compliant Vitals:   10/25/17 1549 10/25/17 1559 10/25/17 1609 10/25/17 1619  BP: 105/72 93/70 99/72  111/80  Pulse:  78 74   Resp: 15 10  10 12  Temp:  97.8 F (36.6 C)  97.6 F (36.4 C)  TempSrc:  Temporal    SpO2: 99% 95% 98% 100%  Weight:      Height:        Start Time: 1514 hrs. End Time: 1546 hrs. Materials & Medications:  Needle(s) Type: Teflon-coated, curved tip, Radiofrequency needle(s) Gauge: 22G Length: 10cm Medication(s): Please see orders for medications and dosing details.  Imaging Guidance (Non-Spinal):          Type of Imaging Technique: Fluoroscopy Guidance (Non-Spinal) Indication(s): Assistance in needle guidance and placement for procedures requiring needle placement in or near specific anatomical locations not easily accessible without such assistance. Exposure Time: Please see nurses notes. Contrast: Before injecting any contrast, we confirmed that the patient did not have an allergy to iodine, shellfish, or radiological contrast. Once satisfactory needle placement was completed at the desired level, radiological contrast was injected.  Contrast injected under live fluoroscopy. No contrast complications. See chart for type and volume of contrast used. Fluoroscopic Guidance: I was personally present during the use of fluoroscopy. "Tunnel Vision Technique" used to obtain the best possible view of the target area. Parallax error corrected before commencing the procedure. "Direction-depth-direction" technique used to introduce the needle under continuous pulsed fluoroscopy. Once target was reached, antero-posterior, oblique, and lateral fluoroscopic projection used confirm needle placement in all planes. Images permanently stored in EMR. Interpretation: I personally interpreted the imaging intraoperatively. Adequate needle placement confirmed in multiple planes. Appropriate spread of contrast into desired area was observed. No evidence of afferent or efferent intravascular uptake. Permanent images saved into the patient's record.  Antibiotic Prophylaxis:   Anti-infectives (From admission, onward)   Start     Dose/Rate Route Frequency Ordered Stop   10/25/17 1415  ceFAZolin (ANCEF) IVPB 1 g/50 mL premix     1 g 100 mL/hr over 30 Minutes Intravenous  Once 10/25/17 1412 10/25/17 1518     Indication(s): None identified  Post-operative Assessment:  Post-procedure Vital Signs:  Pulse/HCG Rate: 7473 Temp: 97.6 F (36.4 C) Resp: 12 BP: 111/80 SpO2: 100 %  EBL: None  Complications: No immediate post-treatment complications observed by team, or reported by patient.  Note: The patient tolerated the entire procedure well. A repeat set of vitals were taken after the procedure and the patient was kept under observation following institutional policy, for this type of procedure. Post-procedural neurological assessment was performed, showing return to baseline, prior to discharge. The patient was provided with post-procedure discharge instructions, including a section on how to identify potential problems. Should any problems arise  concerning this procedure, the patient was given instructions to immediately contact us, at any time, without hesitation. In any case, we plan to contact the patient by telephone for a follow-up status report regarding this interventional procedure.  Comments:  No additional relevant information.  Plan of Care   Imaging Orders     DG C-Arm 1-60 Min-No Report  Procedure Orders     Radiofrequency,Genicular  Medications ordered for procedure: Meds ordered this encounter  Medications  . lidocaine (XYLOCAINE) 2 % (with pres) injection 400 mg  . midazolam (VERSED) 5 MG/5ML injection 1-2 mg    Make sure Flumazenil is available in the pyxis when using this medication. If oversedation occurs, administer 0.2 mg IV over 15 sec. If after 45 sec no response, administer 0.2 mg again over 1 min; may repeat at 1 min intervals; not to exceed 4 doses (1 mg)  . fentaNYL (SUBLIMAZE) injection  25-50 mcg    Make sure Narcan is available in the pyxis when using this medication. In the event of respiratory depression (RR< 8/min): Titrate NARCAN (naloxone) in increments of 0.1 to 0.2 mg IV at 2-3 minute intervals, until desired degree of reversal.  . lactated ringers infusion 1,000 mL  . ropivacaine (PF) 2 mg/mL (0.2%) (NAROPIN) injection 9 mL  . ceFAZolin (ANCEF) IVPB 1 g/50 mL premix    Order Specific Question:   Antibiotic Indication:    Answer:   Surgical Prophylaxis    Order Specific Question:   Other Indication:    Answer:   Surgical Prophylaxis  . diphenhydrAMINE (BENADRYL) injection 50 mg  . promethazine (PHENERGAN) injection 12.5-25 mg  . heparin flush 10 UNIT/ML injection 50 Units  . sodium chloride flush (NS) 0.9 % injection 10 mL  . HYDROmorphone (DILAUDID) 2 MG tablet    Sig: Take 1 tablet (2 mg total) by mouth every 8 (eight) hours as needed for up to 7 days for severe pain.    Dispense:  21 tablet    Refill:  0    Do not place this medication, or any other prescription from our practice, on  "Automatic Refill". Patient may have prescription filled one day early if pharmacy is closed on scheduled refill date. Do not fill until:  To last until:  . heparin lock flush 100 unit/mL   Medications administered: We administered lidocaine, midazolam, fentaNYL, lactated ringers, ropivacaine (PF) 2 mg/mL (0.2%), ceFAZolin, diphenhydrAMINE, promethazine, and sodium chloride flush.  See the medical record for exact dosing, route, and time of administration.  New Prescriptions   HYDROMORPHONE (DILAUDID) 2 MG TABLET    Take 1 tablet (2 mg total) by mouth every 8 (eight) hours as needed for up to 7 days for severe pain.   Disposition: Discharge home  Discharge Date & Time: 10/25/2017; 1619 hrs.   Physician-requested Follow-up: Return for Post-RFA eval (6 wks), w/ Dionisio David, NP.  Future Appointments  Date Time Provider [place]  12/06/2017 11:15 AM Vevelyn Francois, NP [facility] None  01/06/2018 10:30 AM Melvenia Beam, MD [facility] None   Primary Care Physician: Jani Gravel, MD Location: [facility] Note by: Gaspar Cola, MD Date: 10/25/2017; Time: 4:29 PM  Disclaimer:  Medicine is not an Chief Strategy Officer. The only guarantee in medicine is that nothing is guaranteed. It is important to note that the decision to proceed with this intervention was based on the information collected from the patient. The Data and conclusions were drawn from the patient's questionnaire, the interview, and the physical examination. Because the information was provided in large part by the patient, it cannot be guaranteed that it has not been purposely or unconsciously manipulated. Every effort has been made to obtain as much relevant data as possible for this evaluation. It is important to note that the conclusions that lead to this procedure are derived in large part from the available data. Always take into account that the treatment will also be dependent on availability  of resources and existing treatment guidelines, considered by other Pain Management Practitioners as being common knowledge and practice, at the time of the intervention. For Medico-Legal purposes, it is also important to point out that variation in procedural techniques and pharmacological choices are the acceptable norm. The indications, contraindications, technique, and results of the above procedure should only be interpreted and judged by a Board-Certified Interventional Pain Specialist with extensive familiarity and expertise in the same exact  procedure and technique.

## 2017-10-25 NOTE — Progress Notes (Signed)
Safety precautions to be maintained throughout the outpatient stay will include: orient to surroundings, keep bed in low position, maintain call bell within reach at all times, provide assistance with transfer out of bed and ambulation.  

## 2017-10-26 ENCOUNTER — Telehealth: Payer: Self-pay | Admitting: *Deleted

## 2017-10-26 NOTE — Telephone Encounter (Signed)
No answer. Left voicemail for patient to call for any post procedure concerns. 

## 2017-10-30 DIAGNOSIS — L500 Allergic urticaria: Secondary | ICD-10-CM | POA: Diagnosis not present

## 2017-11-01 DIAGNOSIS — N200 Calculus of kidney: Secondary | ICD-10-CM | POA: Diagnosis not present

## 2017-11-01 DIAGNOSIS — Y848 Other medical procedures as the cause of abnormal reaction of the patient, or of later complication, without mention of misadventure at the time of the procedure: Secondary | ICD-10-CM | POA: Diagnosis not present

## 2017-11-01 DIAGNOSIS — T508X5A Adverse effect of diagnostic agents, initial encounter: Secondary | ICD-10-CM | POA: Diagnosis not present

## 2017-11-01 DIAGNOSIS — I700 Atherosclerosis of aorta: Secondary | ICD-10-CM | POA: Diagnosis not present

## 2017-11-14 DIAGNOSIS — G4709 Other insomnia: Secondary | ICD-10-CM | POA: Diagnosis not present

## 2017-11-14 DIAGNOSIS — M797 Fibromyalgia: Secondary | ICD-10-CM | POA: Diagnosis not present

## 2017-11-14 DIAGNOSIS — M8700 Idiopathic aseptic necrosis of unspecified bone: Secondary | ICD-10-CM | POA: Diagnosis not present

## 2017-11-14 DIAGNOSIS — Z5181 Encounter for therapeutic drug level monitoring: Secondary | ICD-10-CM | POA: Diagnosis not present

## 2017-11-14 DIAGNOSIS — Z79899 Other long term (current) drug therapy: Secondary | ICD-10-CM | POA: Diagnosis not present

## 2017-11-15 DIAGNOSIS — J309 Allergic rhinitis, unspecified: Secondary | ICD-10-CM | POA: Diagnosis not present

## 2017-11-23 ENCOUNTER — Telehealth: Payer: Self-pay | Admitting: Neurology

## 2017-11-23 NOTE — Telephone Encounter (Signed)
Patient calling to schedule a nerve block because she cannot get a grasp on her headaches. She can only come Mon, [place] or Wed.

## 2017-11-23 NOTE — Telephone Encounter (Signed)
D/w Dr. Jaynee Eagles. Called patient and LVM asking for call back to try to get her scheduled next Tuesday. Left office number and hours in message.

## 2017-11-24 NOTE — Telephone Encounter (Signed)
Pt returning RNs call, scheduled nerve block for 10/8 at 3:30

## 2017-11-24 NOTE — Telephone Encounter (Signed)
Noted thank you

## 2017-11-24 NOTE — Telephone Encounter (Signed)
Called pt once more and LVM asking for call back so we can get her on the schedule for next week. Left office number in message.

## 2017-11-29 ENCOUNTER — Ambulatory Visit (INDEPENDENT_AMBULATORY_CARE_PROVIDER_SITE_OTHER): Payer: BLUE CROSS/BLUE SHIELD | Admitting: Neurology

## 2017-11-29 DIAGNOSIS — G43711 Chronic migraine without aura, intractable, with status migrainosus: Secondary | ICD-10-CM

## 2017-11-29 MED ORDER — FREMANEZUMAB-VFRM 225 MG/1.5ML ~~LOC~~ SOSY: 675.0000 mg | PREFILLED_SYRINGE | SUBCUTANEOUS | 4 refills | Status: DC

## 2017-11-29 NOTE — Progress Notes (Signed)
Patient here with spouse, having a severe headache since September. Nerve blocks improved the migraine but still symptomatic brought to migraine infusion. Aimovig in the past augmented her botox but could not have it approved, will try Ajovy. Prior to Botox her migraines were daily continuous. Since Starting Botox her migraines are 50% improved (from daily to 15 headaches days a month of those 8 being migrainous). She still has a burden of headaches even after >50% improvement with botox recommend Ajovy concurrently.   Performed by Dr. Jaynee Eagles M.D. All procedures a documented below were medically necessary, reasonable and appropriate based on the patient's history, medical diagnosis and physician opinion. Verbal informed consent was obtained from the patient, patient was informed of potential risk of procedure, including bruising, bleeding, hematoma formation, infection, muscle weakness, muscle pain, numbness, transient hypertension, transient hyperglycemia and transient insomnia among others. All areas injected were topically clean with isopropyl rubbing alcohol. Nonsterile nonlatex gloves were worn during the procedure.  Meds ordered this encounter  Medications  . Fremanezumab-vfrm (AJOVY) 225 MG/1.5ML SOSY    Sig: Inject 675 mg into the skin every 3 (three) months.    Dispense:  3 Syringe    Refill:  4    Patient has an [facility] card.    1. Greater occipital nerve block [phone]). The greater occipital nerve site was identified at the nuchal line medial to the occipital artery. Medication was injected into the left and right occipital nerve areas and suboccipital areas. Patient's condition is associated with inflammation of the greater occipital nerve and associated multiple groups. Injection was deemed medically necessary, reasonable and appropriate. Injection represents a separate and unique surgical service.  2. Lesser occipital nerve block [id number]). The lesser occipital nerve site was identified  approximately 2 cm lateral to the greater occipital nerve. Occasion was injected into the left and right occipital nerve areas. Patient's condition is associated with inflammation of the lesser occipital nerve and associated muscle groups. Injection was deemed medically necessary, reasonable and appropriate. Injection represents a separate and unique surgical service.   3. Auriculotemporal nerve block (54650): The Auriculotemporal nerve site was identified along the posterior margin of the sternocleidomastoid muscle toward the base of the ear. Medication was injected into the left and right radicular temporal nerve areas. Patient's condition is associated with inflammation of the Auriculotemporal Nerve and associated muscle groups. Injection was deemed medically necessary, reasonable and appropriate. Injection represents a separate and unique surgical service.  4. Supraorbital nerve block (64400): Supraorbital nerve site was identified along the incision of the frontal bone on the orbital/supraorbital ridge. Medication was injected into the left and right supraorbital nerve areas. Patient's condition is associated with inflammation of the supraorbital and associated muscle groups. Injection was deemed medically necessary, reasonable and appropriate. Injection represents a separate and unique surgical service.

## 2017-11-29 NOTE — Progress Notes (Signed)
Nerve block  (w or w/o) steroid: without Pt signed consent  0.5% Bupivocaine 5 mL LOT: [id number] EXP: 07/2019 NDC: 59470-761-51  2% Lidocaine 20 mL LOT: [id number] EXP: 03/26/2019 NDC: 8343-7357-89

## 2017-12-05 DIAGNOSIS — M7918 Myalgia, other site: Secondary | ICD-10-CM | POA: Diagnosis not present

## 2017-12-05 DIAGNOSIS — N3011 Interstitial cystitis (chronic) with hematuria: Secondary | ICD-10-CM | POA: Diagnosis not present

## 2017-12-05 DIAGNOSIS — R3915 Urgency of urination: Secondary | ICD-10-CM | POA: Diagnosis not present

## 2017-12-05 DIAGNOSIS — R310 Gross hematuria: Secondary | ICD-10-CM | POA: Diagnosis not present

## 2017-12-05 DIAGNOSIS — N200 Calculus of kidney: Secondary | ICD-10-CM | POA: Diagnosis not present

## 2017-12-05 DIAGNOSIS — E890 Postprocedural hypothyroidism: Secondary | ICD-10-CM | POA: Insufficient documentation

## 2017-12-05 DIAGNOSIS — Z87442 Personal history of urinary calculi: Secondary | ICD-10-CM | POA: Diagnosis not present

## 2017-12-05 DIAGNOSIS — R350 Frequency of micturition: Secondary | ICD-10-CM | POA: Diagnosis not present

## 2017-12-05 DIAGNOSIS — N3010 Interstitial cystitis (chronic) without hematuria: Secondary | ICD-10-CM | POA: Diagnosis not present

## 2017-12-06 ENCOUNTER — Ambulatory Visit: Payer: BLUE CROSS/BLUE SHIELD | Admitting: Nurse Practitioner

## 2017-12-27 DIAGNOSIS — R82998 Other abnormal findings in urine: Secondary | ICD-10-CM | POA: Diagnosis not present

## 2017-12-27 DIAGNOSIS — N3011 Interstitial cystitis (chronic) with hematuria: Secondary | ICD-10-CM | POA: Diagnosis not present

## 2017-12-30 DIAGNOSIS — Z79899 Other long term (current) drug therapy: Secondary | ICD-10-CM | POA: Diagnosis not present

## 2018-01-06 ENCOUNTER — Encounter: Payer: Self-pay | Admitting: *Deleted

## 2018-01-06 ENCOUNTER — Ambulatory Visit (INDEPENDENT_AMBULATORY_CARE_PROVIDER_SITE_OTHER): Payer: BLUE CROSS/BLUE SHIELD | Admitting: Neurology

## 2018-01-06 DIAGNOSIS — G43711 Chronic migraine without aura, intractable, with status migrainosus: Secondary | ICD-10-CM

## 2018-01-06 NOTE — Telephone Encounter (Signed)
error 

## 2018-01-06 NOTE — Progress Notes (Signed)
Botox was NOT drawn up or administered today due to pt having migraine.   Infusion orders written per v.o. Dr. Jaynee Eagles, signed by Dr. Jaynee Eagles and given to infusion RN along with ins info & allergies.  Toradol 30 mg IV x 1  Depacon 1 gram IV x 1 Compazine 10 mg IV x 1 Solumedrol 250 mg IV x 1

## 2018-01-09 ENCOUNTER — Telehealth: Payer: Self-pay | Admitting: Neurology

## 2018-01-09 NOTE — Telephone Encounter (Signed)
Patient called and stated that she was told on Friday 11/15 to come in for an apt for her Botox tomorrow 11/19 at 9:30. She states that Dr. Jaynee Eagles told her that she has a Botox apt on the schedule for 11/19 @ 9:30 and she wants to confirm. There is no apt on the schedule and no record of this. Please advise.

## 2018-01-10 NOTE — Telephone Encounter (Signed)
Noted, scheduled.

## 2018-01-18 ENCOUNTER — Encounter: Payer: Self-pay | Admitting: Neurology

## 2018-01-18 ENCOUNTER — Encounter: Payer: Self-pay | Admitting: *Deleted

## 2018-01-18 ENCOUNTER — Ambulatory Visit (INDEPENDENT_AMBULATORY_CARE_PROVIDER_SITE_OTHER): Payer: BLUE CROSS/BLUE SHIELD | Admitting: Neurology

## 2018-01-18 DIAGNOSIS — G43711 Chronic migraine without aura, intractable, with status migrainosus: Secondary | ICD-10-CM | POA: Diagnosis not present

## 2018-01-18 MED ORDER — ERENUMAB-AOOE 140 MG/ML ~~LOC~~ SOAJ: 140.0000 mg | SUBCUTANEOUS | 0 refills | Status: DC

## 2018-01-18 MED ORDER — KETOROLAC TROMETHAMINE 60 MG/2ML IM SOLN
60.0000 mg | Freq: Once | INTRAMUSCULAR | Status: AC
  Administered 2018-01-18: 60 mg via INTRAMUSCULAR

## 2018-01-18 NOTE — Progress Notes (Signed)
Botox- 100 units x 2 vials [id number] Expiration: 05/2020 NDC: 7583-0746-00  Bacteriostatic 0.9% Sodium Chloride- 53mL total Lot: [id number] Expiration: 11/23/2018 NDC: 0856-9437-00  Dx: F25.910 S/P

## 2018-01-18 NOTE — Progress Notes (Signed)
Interval history 01/18/2018: Aimovig in the past augmented her botox but could not have it approved, will try Ajovy. Prior to Botox her migraines were daily continuous. Since Starting Botox her migraines are 50% improved (from daily to 15 headaches days a month of those 8 being migrainous). She still has a burden of headaches even after >50% improvement with botox recommend Ajovy concurrently.  Interval history 07/11/2017:  + masseters, + temples, + eyes, did not do traps used the extra on the forehead, lateral corrugators, end of brow  She is 75% better as far as frequency and severity of migraines.    Interval history 04/05/2017: She has shingles today and she is in pain in her right hip. She has had shinges multiple times. She has had the flu as well and she was in the bed sick and she had some headaches. Her baseline is daily migraines and since her botox (this is her third shot) she has only had one very bad migraine, tremendous improvement. She has a headache today but she is sick.      Consent Form Botulism Toxin Injection For Chronic Migraine    Reviewed orally with patient, additionally signature is on file:  Botulism toxin has been approved by the Federal drug administration for treatment of chronic migraine. Botulism toxin does not cure chronic migraine and it may not be effective in some patients.  The administration of botulism toxin is accomplished by injecting a small amount of toxin into the muscles of the neck and head. Dosage must be titrated for each individual. Any benefits resulting from botulism toxin tend to wear off after 3 months with a repeat injection required if benefit is to be maintained. Injections are usually done every 3-4 months with maximum effect peak achieved by about 2 or 3 weeks. Botulism toxin is expensive and you should be sure of what costs you will incur resulting from the injection.  The side effects of botulism toxin use for chronic migraine may  include:   -Transient, and usually mild, facial weakness with facial injections  -Transient, and usually mild, head or neck weakness with head/neck injections  -Reduction or loss of forehead facial animation due to forehead muscle weakness  -Eyelid drooping  -Dry eye  -Pain at the site of injection or bruising at the site of injection  -Double vision  -Potential unknown long term risks  Contraindications: You should not have Botox if you are pregnant, nursing, allergic to albumin, have an infection, skin condition, or muscle weakness at the site of the injection, or have myasthenia gravis, Lambert-Eaton syndrome, or ALS.  It is also possible that as with any injection, there may be an allergic reaction or no effect from the medication. Reduced effectiveness after repeated injections is sometimes seen and rarely infection at the injection site may occur. All care will be taken to prevent these side effects. If therapy is given over a long time, atrophy and wasting in the muscle injected may occur. Occasionally the patient's become refractory to treatment because they develop antibodies to the toxin. In this event, therapy needs to be modified.  I have read the above information and consent to the administration of botulism toxin.    BOTOX PROCEDURE NOTE FOR MIGRAINE HEADACHE    Contraindications and precautions discussed with patient(above). Aseptic procedure was observed and patient tolerated procedure. Procedure performed by Dr. Georgia Dom  The condition has existed for more than 6 months, and pt does not have a diagnosis of ALS, Myasthenia Gravis  or Lambert-Eaton Syndrome.  Risks and benefits of injections discussed and pt agrees to proceed with the procedure.  Written consent obtained  These injections are medically necessary. Pt  receives good benefits from these injections. These injections do not cause sedations or hallucinations which the oral therapies may cause.  Description of  procedure:  The patient was placed in a sitting position. The standard protocol was used for Botox as follows, with 5 units of Botox injected at each site:   -Procerus muscle, midline injection  -Corrugator muscle, bilateral injection  -Frontalis muscle, bilateral injection, with 2 sites each side, medial injection was performed in the upper one third of the frontalis muscle, in the region vertical from the medial inferior edge of the superior orbital rim. The lateral injection was again in the upper one third of the forehead vertically above the lateral limbus of the cornea, 1.5 cm lateral to the medial injection site.  - Levator Scapulae: 5 units bilaterally  -Temporalis muscle injection, 5 sites, bilaterally. The first injection was 3 cm above the tragus of the ear, second injection site was 1.5 cm to 3 cm up from the first injection site in line with the tragus of the ear. The third injection site was 1.5-3 cm forward between the first 2 injection sites. The fourth injection site was 1.5 cm posterior to the second injection site. 5th site laterally in the temporalis  muscleat the level of the outer canthus.  - Patient feels her clenching is a trigger for headaches. +5 units masseter bilaterally   - Patient feels the migraines are centered around the eyes +5 units bilaterally at the outer canthus in the orbicularis occuli  -Occipitalis muscle injection, 3 sites, bilaterally. The first injection was done one half way between the occipital protuberance and the tip of the mastoid process behind the ear. The second injection site was done lateral and superior to the first, 1 fingerbreadth from the first injection. The third injection site was 1 fingerbreadth superiorly and medially from the first injection site.  -Cervical paraspinal muscle injection, 2 sites, bilateral knee first injection site was 1 cm from the midline of the cervical spine, 3 cm inferior to the lower border of the occipital  protuberance. The second injection site was 1.5 cm superiorly and laterally to the first injection site.  -Trapezius muscle injection was performed at 3 sites, bilaterally. The first injection site was in the upper trapezius muscle halfway between the inflection point of the neck, and the acromion. The second injection site was one half way between the acromion and the first injection site. The third injection was done between the first injection site and the inflection point of the neck.   Will return for repeat injection in 3 months.   A 200 unit sof Botox was used, any Botox not injected was wasted. The patient tolerated the procedure well, there were no complications of the above procedure.

## 2018-02-23 ENCOUNTER — Telehealth: Payer: Self-pay | Admitting: Neurology

## 2018-02-23 NOTE — Telephone Encounter (Addendum)
I spoke with Dr. Rexene Alberts who authorized an infusion. May repeat what pt received at last infusion in November if pt has a driver and if it worked last time for her. Per Otila Kluver in infusion, it can be done today. Spoke with patient. She stated this is her typical migraine, no new symptoms or concerns. Pt stated that her migraine actually seems to be getting better and she will hold for now and see if it continues to improve. Pt will call back if needed. She verbalized appreciation. Dr. Rexene Alberts updated.

## 2018-02-23 NOTE — Telephone Encounter (Signed)
Appointment Request From: Jeananne Rama    With Provider: Melvenia Beam, MD [[facility]]    Preferred Date Range: 02/21/2018 - 02/22/2018    Preferred Times: Any time    Reason for visit: Request an Appointment    Comments:  Migraine that I cant get a handle on I need a cocktail desperately thank you

## 2018-02-24 DIAGNOSIS — M797 Fibromyalgia: Secondary | ICD-10-CM | POA: Diagnosis not present

## 2018-02-24 DIAGNOSIS — J019 Acute sinusitis, unspecified: Secondary | ICD-10-CM | POA: Diagnosis not present

## 2018-02-24 DIAGNOSIS — E039 Hypothyroidism, unspecified: Secondary | ICD-10-CM | POA: Diagnosis not present

## 2018-02-24 DIAGNOSIS — F5101 Primary insomnia: Secondary | ICD-10-CM | POA: Diagnosis not present

## 2018-02-24 DIAGNOSIS — M25519 Pain in unspecified shoulder: Secondary | ICD-10-CM | POA: Diagnosis not present

## 2018-02-24 DIAGNOSIS — F419 Anxiety disorder, unspecified: Secondary | ICD-10-CM | POA: Diagnosis not present

## 2018-02-27 DIAGNOSIS — S90121A Contusion of right lesser toe(s) without damage to nail, initial encounter: Secondary | ICD-10-CM | POA: Diagnosis not present

## 2018-02-27 DIAGNOSIS — M542 Cervicalgia: Secondary | ICD-10-CM | POA: Diagnosis not present

## 2018-02-27 DIAGNOSIS — M4692 Unspecified inflammatory spondylopathy, cervical region: Secondary | ICD-10-CM | POA: Diagnosis not present

## 2018-03-07 DIAGNOSIS — J3489 Other specified disorders of nose and nasal sinuses: Secondary | ICD-10-CM | POA: Diagnosis not present

## 2018-03-07 DIAGNOSIS — R590 Localized enlarged lymph nodes: Secondary | ICD-10-CM | POA: Diagnosis not present

## 2018-03-08 DIAGNOSIS — M25512 Pain in left shoulder: Secondary | ICD-10-CM | POA: Diagnosis not present

## 2018-03-20 DIAGNOSIS — H18832 Recurrent erosion of cornea, left eye: Secondary | ICD-10-CM | POA: Diagnosis not present

## 2018-03-22 DIAGNOSIS — H18832 Recurrent erosion of cornea, left eye: Secondary | ICD-10-CM | POA: Diagnosis not present

## 2018-03-28 ENCOUNTER — Other Ambulatory Visit: Payer: Self-pay | Admitting: Otolaryngology

## 2018-03-28 DIAGNOSIS — R590 Localized enlarged lymph nodes: Secondary | ICD-10-CM

## 2018-03-29 DIAGNOSIS — M47812 Spondylosis without myelopathy or radiculopathy, cervical region: Secondary | ICD-10-CM | POA: Diagnosis not present

## 2018-03-29 DIAGNOSIS — M1711 Unilateral primary osteoarthritis, right knee: Secondary | ICD-10-CM | POA: Diagnosis not present

## 2018-04-04 ENCOUNTER — Other Ambulatory Visit: Payer: BLUE CROSS/BLUE SHIELD

## 2018-04-07 ENCOUNTER — Telehealth: Payer: Self-pay | Admitting: Neurology

## 2018-04-07 NOTE — Telephone Encounter (Signed)
I called [facility] at [phone] to request a refill on the patients Botox medication. I spoke with Desiree we scheduled the medication for delivery on 04/12/18. DW

## 2018-04-13 DIAGNOSIS — M47816 Spondylosis without myelopathy or radiculopathy, lumbar region: Secondary | ICD-10-CM | POA: Diagnosis not present

## 2018-04-19 DIAGNOSIS — R05 Cough: Secondary | ICD-10-CM | POA: Diagnosis not present

## 2018-04-21 ENCOUNTER — Ambulatory Visit (INDEPENDENT_AMBULATORY_CARE_PROVIDER_SITE_OTHER): Payer: BLUE CROSS/BLUE SHIELD | Admitting: Neurology

## 2018-04-21 DIAGNOSIS — G43711 Chronic migraine without aura, intractable, with status migrainosus: Secondary | ICD-10-CM | POA: Diagnosis not present

## 2018-04-21 DIAGNOSIS — G43001 Migraine without aura, not intractable, with status migrainosus: Secondary | ICD-10-CM | POA: Diagnosis not present

## 2018-04-21 NOTE — Progress Notes (Signed)
Botox- 100 units x 2 vials Lot: [id number] Expiration: 09/2020 NDC: 9021-1155-20  Bacteriostatic 0.9% Sodium Chloride- 64mL total Lot: [id number] Expiration: 11/23/2018 NDC: 6122-4497-53  Dx: Y05.110 S/P   Migraine today. Pt given infusion before Botox: Toradol 30 mg IV x 1  Depacon 1 gram IV x 1 Compazine 10 mg IV x 1 Solumedrol 250 mg IV x 1

## 2018-04-21 NOTE — Progress Notes (Signed)
Interval history 04/21/2017: Aimovig in the past augmented her botox but could not have it approved, will try Ajovy. Prior to Botox her migraines were daily continuous. Since Starting Botox her migraines are 50% improved (from daily to 15 headaches days a month of those 8 being migrainous). She still has a burden of headaches even after >50% improvement with botox recommend Ajovy concurrently. She sees Dr. Jacelyn Grip for occipital and S1 RFA and goes to Dr. Harl Favor in [place] as he is the only one to do RFA on nerves of the knee  [id number]  Corrugators, lateral corrugators, end of brown, 77 bunny lines and 4 spots x 4 spots OO, masseters    Interval history 07/11/2017:  + masseters, + temples, + eyes, did not do traps used the extra on the forehead, lateral corrugators, end of brow  She is 75% better as far as frequency and severity of migraines.    Interval history 04/05/2017: She has shingles today and she is in pain in her right hip. She has had shinges multiple times. She has had the flu as well and she was in the bed sick and she had some headaches. Her baseline is daily migraines and since her botox (this is her third shot) she has only had one very bad migraine, tremendous improvement. She has a headache today but she is sick.      Consent Form Botulism Toxin Injection For Chronic Migraine    Reviewed orally with patient, additionally signature is on file:  Botulism toxin has been approved by the Federal drug administration for treatment of chronic migraine. Botulism toxin does not cure chronic migraine and it may not be effective in some patients.  The administration of botulism toxin is accomplished by injecting a small amount of toxin into the muscles of the neck and head. Dosage must be titrated for each individual. Any benefits resulting from botulism toxin tend to wear off after 3 months with a repeat injection required if benefit is to be maintained. Injections are usually done  every 3-4 months with maximum effect peak achieved by about 2 or 3 weeks. Botulism toxin is expensive and you should be sure of what costs you will incur resulting from the injection.  The side effects of botulism toxin use for chronic migraine may include:   -Transient, and usually mild, facial weakness with facial injections  -Transient, and usually mild, head or neck weakness with head/neck injections  -Reduction or loss of forehead facial animation due to forehead muscle weakness  -Eyelid drooping  -Dry eye  -Pain at the site of injection or bruising at the site of injection  -Double vision  -Potential unknown long term risks  Contraindications: You should not have Botox if you are pregnant, nursing, allergic to albumin, have an infection, skin condition, or muscle weakness at the site of the injection, or have myasthenia gravis, Lambert-Eaton syndrome, or ALS.  It is also possible that as with any injection, there may be an allergic reaction or no effect from the medication. Reduced effectiveness after repeated injections is sometimes seen and rarely infection at the injection site may occur. All care will be taken to prevent these side effects. If therapy is given over a long time, atrophy and wasting in the muscle injected may occur. Occasionally the patient's become refractory to treatment because they develop antibodies to the toxin. In this event, therapy needs to be modified.  I have read the above information and consent to the administration of botulism toxin.  BOTOX PROCEDURE NOTE FOR MIGRAINE HEADACHE    Contraindications and precautions discussed with patient(above). Aseptic procedure was observed and patient tolerated procedure. Procedure performed by Dr. Georgia Dom  The condition has existed for more than 6 months, and pt does not have a diagnosis of ALS, Myasthenia Gravis or Lambert-Eaton Syndrome.  Risks and benefits of injections discussed and pt agrees to proceed  with the procedure.  Written consent obtained  These injections are medically necessary. Pt  receives good benefits from these injections. These injections do not cause sedations or hallucinations which the oral therapies may cause.  Description of procedure:  The patient was placed in a sitting position. The standard protocol was used for Botox as follows, with 5 units of Botox injected at each site:   -Procerus muscle, midline injection  -Corrugator muscle, bilateral injection  -Frontalis muscle, bilateral injection, with 2 sites each side, medial injection was performed in the upper one third of the frontalis muscle, in the region vertical from the medial inferior edge of the superior orbital rim. The lateral injection was again in the upper one third of the forehead vertically above the lateral limbus of the cornea, 1.5 cm lateral to the medial injection site.  - Levator Scapulae: 5 units bilaterally  -Temporalis muscle injection, 5 sites, bilaterally. The first injection was 3 cm above the tragus of the ear, second injection site was 1.5 cm to 3 cm up from the first injection site in line with the tragus of the ear. The third injection site was 1.5-3 cm forward between the first 2 injection sites. The fourth injection site was 1.5 cm posterior to the second injection site. 5th site laterally in the temporalis  muscleat the level of the outer canthus.  - Patient feels her clenching is a trigger for headaches. +5 units masseter bilaterally   - Patient feels the migraines are centered around the eyes +5 units bilaterally at the outer canthus in the orbicularis occuli  -Occipitalis muscle injection, 3 sites, bilaterally. The first injection was done one half way between the occipital protuberance and the tip of the mastoid process behind the ear. The second injection site was done lateral and superior to the first, 1 fingerbreadth from the first injection. The third injection site was 1  fingerbreadth superiorly and medially from the first injection site.  -Cervical paraspinal muscle injection, 2 sites, bilateral knee first injection site was 1 cm from the midline of the cervical spine, 3 cm inferior to the lower border of the occipital protuberance. The second injection site was 1.5 cm superiorly and laterally to the first injection site.  -Trapezius muscle injection was performed at 3 sites, bilaterally. The first injection site was in the upper trapezius muscle halfway between the inflection point of the neck, and the acromion. The second injection site was one half way between the acromion and the first injection site. The third injection was done between the first injection site and the inflection point of the neck.   Will return for repeat injection in 3 months.   A 200 unit sof Botox was used, any Botox not injected was wasted. The patient tolerated the procedure well, there were no complications of the above procedure.

## 2018-04-23 ENCOUNTER — Telehealth: Payer: Self-pay | Admitting: Neurology

## 2018-04-23 NOTE — Telephone Encounter (Signed)
Kari Green, patient states [facility] there and see what is going on? thanks

## 2018-04-24 ENCOUNTER — Encounter: Payer: Self-pay | Admitting: *Deleted

## 2018-04-24 NOTE — Telephone Encounter (Signed)
Sent pt a mychart message. 

## 2018-04-24 NOTE — Telephone Encounter (Signed)
[facility]  He stated that the pharmacy had received a shipment of medications today and he anticipates this is part of the shipment and they may have it ready this afternoon.

## 2018-05-02 ENCOUNTER — Encounter: Payer: Self-pay | Admitting: *Deleted

## 2018-05-02 NOTE — Telephone Encounter (Signed)
Patient called in and stated her insurance didn't approve Ajovy due to Dr Jaynee Eagles not calling in. She stated she needed Prior Auth done .

## 2018-05-02 NOTE — Telephone Encounter (Signed)
I spoke with Merrilee Se[facility]ore [facility] today. They do have the prescription and he said that the medication is non-formulary and pt must either meet step therapy or provider call [phone]. He said they have sent a fax over to our office with this information.

## 2018-05-02 NOTE — Telephone Encounter (Signed)
Ajovy was prescribed by Dr. Jaynee Eagles 11/2017 with refills to last a year. I had [facility] week and he anticipated a shipment would come in on the same day.

## 2018-05-02 NOTE — Telephone Encounter (Addendum)
Spoke with [facility] Creamer [facility] and obtained [facility]'s insurance info:   [facility] ID: [id number] BIN: [id number] PCN: [id number] GROUP: [id number]  I initiated a PA today on for Ajovy 225 mg/1.5 mL #4.5 mL per 90 days. Cover My Meds under KEY: [id number]. Currently awaiting clinical questions.

## 2018-05-02 NOTE — Telephone Encounter (Signed)
PA completed. Awaiting determination from [place]. Pt emailed with update.  "If [facility] has not responded to your request within 24 hours, contact [place] at [phone]."

## 2018-05-04 DIAGNOSIS — E039 Hypothyroidism, unspecified: Secondary | ICD-10-CM | POA: Diagnosis not present

## 2018-05-04 DIAGNOSIS — I10 Essential (primary) hypertension: Secondary | ICD-10-CM | POA: Diagnosis not present

## 2018-05-04 DIAGNOSIS — F1920 Other psychoactive substance dependence, uncomplicated: Secondary | ICD-10-CM | POA: Diagnosis not present

## 2018-05-04 DIAGNOSIS — F5101 Primary insomnia: Secondary | ICD-10-CM | POA: Diagnosis not present

## 2018-05-04 DIAGNOSIS — Z5181 Encounter for therapeutic drug level monitoring: Secondary | ICD-10-CM | POA: Diagnosis not present

## 2018-05-17 DIAGNOSIS — J309 Allergic rhinitis, unspecified: Secondary | ICD-10-CM | POA: Diagnosis not present

## 2018-05-19 DIAGNOSIS — R05 Cough: Secondary | ICD-10-CM | POA: Diagnosis not present

## 2018-05-19 DIAGNOSIS — R509 Fever, unspecified: Secondary | ICD-10-CM | POA: Diagnosis not present

## 2018-05-19 DIAGNOSIS — Z20828 Contact with and (suspected) exposure to other viral communicable diseases: Secondary | ICD-10-CM | POA: Diagnosis not present

## 2018-05-19 DIAGNOSIS — R0602 Shortness of breath: Secondary | ICD-10-CM | POA: Diagnosis not present

## 2018-05-19 NOTE — Telephone Encounter (Signed)
Per cover my meds:  Your PA request has been closed. Request has been sent to [employer] for review. Follow-up will be sent to your office via fax - [facility], 05/02/2018 11:23 AM  Fax not received yet.

## 2018-05-20 DIAGNOSIS — Z20828 Contact with and (suspected) exposure to other viral communicable diseases: Secondary | ICD-10-CM | POA: Diagnosis not present

## 2018-05-25 ENCOUNTER — Telehealth: Payer: Self-pay | Admitting: Neurology

## 2018-05-25 NOTE — Telephone Encounter (Signed)
Will make note to do this prior to current PA expiration.

## 2018-05-25 NOTE — Telephone Encounter (Signed)
LKZGFU@ [facility] has called re: the PA on pt's Ajovy. Sinclair Grooms stated pt had a current refill of 90 days which should last until 05-20.  Sinclair Grooms wants to speak with RN to know if she wants to start the PA for then now or wait until closer to the date of 05-20

## 2018-07-03 ENCOUNTER — Ambulatory Visit
Admission: RE | Admit: 2018-07-03 | Discharge: 2018-07-03 | Disposition: A | Discharge status: Home / Self Care | Payer: BLUE CROSS/BLUE SHIELD | Source: Ambulatory Visit | Attending: Otolaryngology | Admitting: Otolaryngology

## 2018-07-03 ENCOUNTER — Other Ambulatory Visit: Payer: Self-pay

## 2018-07-03 ENCOUNTER — Ambulatory Visit
Admission: RE | Admit: 2018-07-03 | Discharge: 2018-07-03 | Disposition: A | Discharge status: Home / Self Care | Payer: BLUE CROSS/BLUE SHIELD | Source: Ambulatory Visit | Attending: Internal Medicine | Admitting: Internal Medicine

## 2018-07-03 DIAGNOSIS — Z1231 Encounter for screening mammogram for malignant neoplasm of breast: Secondary | ICD-10-CM

## 2018-07-03 DIAGNOSIS — R590 Localized enlarged lymph nodes: Secondary | ICD-10-CM

## 2018-07-04 ENCOUNTER — Encounter: Payer: Self-pay | Admitting: *Deleted

## 2018-07-04 NOTE — Telephone Encounter (Signed)
Completed Ajovy PA on Cover My Meds. KEY: [id number]. Awaiting determination from [employer].

## 2018-07-04 NOTE — Telephone Encounter (Signed)
Spoke with Dr. Jaynee Eagles about pt's message. Office is not doing migraine cocktails at this time d/t the covid19 pandemic. Will send message to Andee Poles about getting her botox rescheduled.

## 2018-07-06 DIAGNOSIS — M1812 Unilateral primary osteoarthritis of first carpometacarpal joint, left hand: Secondary | ICD-10-CM | POA: Diagnosis not present

## 2018-07-10 NOTE — Telephone Encounter (Signed)
PA in progress for Ajovy 675 mg per 90 days on Cover My Meds. KEY: [id number]. Awaiting [facility] determination.

## 2018-07-10 NOTE — Telephone Encounter (Signed)
Late entry from [place] submitted 5/12. Received notice from [facility] stating pt not a member. Also noted this message on Cover My Meds: Cancelled on May 12  Non [employer] Member...[email or web address].   Will contact pt's pharmacy to get her updated insurance info for PA.

## 2018-07-10 NOTE — Telephone Encounter (Signed)
Received approval notice from [facility]. Ajovy approved from 07/10/2018 through 07/10/2019. Faxed approval notice to pt's pharmacy. Received a receipt of confirmation.

## 2018-07-10 NOTE — Telephone Encounter (Signed)
[facility] and spoke with Merrilee Seashore. He stated pt's PBM is [employer]  ID: [id number] BIN: [id number] PCN: [id number] Group: [id number]

## 2018-07-14 ENCOUNTER — Other Ambulatory Visit: Payer: Self-pay | Admitting: Otolaryngology

## 2018-07-18 NOTE — Progress Notes (Signed)
[id number] - [place], [place] - [address] [place] [address] Phone: [phone] Fax: [phone]      Your procedure is scheduled on June 1  Report to [facility] Main Entrance "A" at 1030 A.M., and check in at the Admitting office.  Call this number if you have problems the morning of surgery:  [phone]  Call [phone] if you have any questions prior to your surgery date Monday-Friday 8am-4pm    Remember:  Do not eat or drink after midnight.    Take these medicines the morning of surgery with A SIP OF WATER  ALPRAZolam (XANAX)  fluticasone (FLONASE) gabapentin (NEURONTIN) HYDROmorphone (DILAUDID)  If needed for pain levothyroxine (SYNTHROID, LEVOTHROID) tiZANidine (ZANAFLEX) topiramate (TOPAMAX)  7 days prior to surgery STOP taking any Aspirin (unless otherwise instructed by your surgeon), Aleve, Naproxen, Ibuprofen, Motrin, Advil, Goody's, BC's, all herbal medications, fish oil, and all vitamins.    The Morning of Surgery  Do not wear jewelry, make-up or nail polish.  Do not wear lotions, powders, or perfumes/colognes, or deodorant  Do not shave 48 hours prior to surgery.  Men may shave face and neck.  Do not bring valuables to the hospital.  [facility] is not responsible for any belongings or valuables.  If you are a smoker, DO NOT Smoke 24 hours prior to surgery IF you wear a CPAP at night please bring your mask, tubing, and machine the morning of surgery   Remember that you must have someone to transport you home after your surgery, and remain with you for 24 hours if you are discharged the same day.   Contacts, glasses, hearing aids, dentures or bridgework may not be worn into surgery.    Leave your suitcase in the car.  After surgery it may be brought to your room.  For patients admitted to the hospital, discharge time will be determined by your treatment team.  Patients discharged the day of  surgery will not be allowed to drive home.    Special instructions:   [place]- Preparing For Surgery  Before surgery, you can play an important role. Because skin is not sterile, your skin needs to be as free of germs as possible. You can reduce the number of germs on your skin by washing with CHG (chlorahexidine gluconate) Soap before surgery.  CHG is an antiseptic cleaner which kills germs and bonds with the skin to continue killing germs even after washing.    Oral Hygiene is also important to reduce your risk of infection.  Remember - BRUSH YOUR TEETH THE MORNING OF SURGERY WITH YOUR REGULAR TOOTHPASTE  Please do not use if you have an allergy to CHG or antibacterial soaps. If your skin becomes reddened/irritated stop using the CHG.  Do not shave (including legs and underarms) for at least 48 hours prior to first CHG shower. It is OK to shave your face.  Please follow these instructions carefully.   1. Shower the NIGHT BEFORE SURGERY and the MORNING OF SURGERY with CHG Soap.   2. If you chose to wash your hair, wash your hair first as usual with your normal shampoo.  3. After you shampoo, rinse your hair and body thoroughly to remove the shampoo.  4. Use CHG as you would any other liquid soap. You can apply CHG directly to the skin and wash gently with a scrungie or a clean washcloth.   5. Apply the CHG Soap  to your body ONLY FROM THE NECK DOWN.  Do not use on open wounds or open sores. Avoid contact with your eyes, ears, mouth and genitals (private parts). Wash Face and genitals (private parts)  with your normal soap.   6. Wash thoroughly, paying special attention to the area where your surgery will be performed.  7. Thoroughly rinse your body with warm water from the neck down.  8. DO NOT shower/wash with your normal soap after using and rinsing off the CHG Soap.  9. Pat yourself dry with a CLEAN TOWEL.  10. Wear CLEAN PAJAMAS to bed the night before surgery, wear  comfortable clothes the morning of surgery  11. Place CLEAN SHEETS on your bed the night of your first shower and DO NOT SLEEP WITH PETS.    Day of Surgery:  Do not apply any deodorants/lotions.  Please wear clean clothes to the hospital/surgery center.   Remember to brush your teeth WITH YOUR REGULAR TOOTHPASTE.   Please read over the following fact sheets that you were given.

## 2018-07-19 ENCOUNTER — Encounter (HOSPITAL_COMMUNITY)
Admission: RE | Admit: 2018-07-19 | Discharge: 2018-07-19 | Disposition: A | Discharge status: Home / Self Care | Payer: BLUE CROSS/BLUE SHIELD | Source: Ambulatory Visit | Attending: Otolaryngology | Admitting: Otolaryngology

## 2018-07-19 ENCOUNTER — Other Ambulatory Visit: Payer: Self-pay

## 2018-07-19 ENCOUNTER — Encounter (HOSPITAL_COMMUNITY): Payer: Self-pay

## 2018-07-19 DIAGNOSIS — I10 Essential (primary) hypertension: Secondary | ICD-10-CM | POA: Insufficient documentation

## 2018-07-19 DIAGNOSIS — M1812 Unilateral primary osteoarthritis of first carpometacarpal joint, left hand: Secondary | ICD-10-CM | POA: Diagnosis not present

## 2018-07-19 DIAGNOSIS — Z01818 Encounter for other preprocedural examination: Secondary | ICD-10-CM | POA: Insufficient documentation

## 2018-07-19 DIAGNOSIS — J309 Allergic rhinitis, unspecified: Secondary | ICD-10-CM | POA: Diagnosis not present

## 2018-07-19 HISTORY — DX: Localized enlarged lymph nodes: R59.0

## 2018-07-19 HISTORY — DX: Fatty (change of) liver, not elsewhere classified: K76.0

## 2018-07-19 HISTORY — DX: Personal history of other specified conditions: Z87.898

## 2018-07-19 LAB — NO BLOOD PRODUCTS

## 2018-07-19 LAB — BASIC METABOLIC PANEL
Anion gap: 10 (ref 5–15)
BUN: 21 mg/dL — ABNORMAL HIGH (ref 6–20)
CO2: 23 mmol/L (ref 22–32)
Calcium: 9.2 mg/dL (ref 8.9–10.3)
Chloride: 107 mmol/L (ref 98–111)
Creatinine, Ser: 0.86 mg/dL (ref 0.44–1.00)
GFR calc Af Amer: >60 mL/min (ref >60)
GFR calc non Af Amer: >60 mL/min (ref >60)
Glucose, Bld: 88 mg/dL (ref 70–99)
Potassium: 3.3 mmol/L — ABNORMAL LOW (ref 3.5–5.1)
Sodium: 140 mmol/L (ref 135–145)

## 2018-07-19 LAB — CBC
HCT: 38.3 % (ref 36.0–46.0)
Hemoglobin: 12.7 g/dL (ref 12.0–15.0)
MCH: 29.7 pg (ref 26.0–34.0)
MCHC: 33.2 g/dL (ref 30.0–36.0)
MCV: 89.5 fL (ref 80.0–100.0)
Platelets: 316 10*3/uL (ref 150–400)
RBC: 4.28 MIL/uL (ref 3.87–5.11)
RDW: 13 % (ref 11.5–15.5)
WBC: 10.7 10*3/uL — ABNORMAL HIGH (ref 4.0–10.5)
nRBC: 0 % (ref 0.0–0.2)

## 2018-07-19 MED ORDER — HEPARIN SOD (PORK) LOCK FLUSH 100 UNIT/ML IV SOLN: 500.0000 [IU] | INTRAVENOUS | Status: DC | PRN

## 2018-07-19 NOTE — Progress Notes (Signed)
[id number] - [place], [place] - [address] [place] [address] Phone: [phone] Fax: [phone]      Your procedure is scheduled on June 1  Report to [facility] Main Entrance "A" at 1030 A.M., and check in at the Admitting office.  Call this number if you have problems the morning of surgery:  [phone]  Call [phone] if you have any questions prior to your surgery date Monday-Friday 8am-4pm    Remember:  Do not eat or drink after midnight.    Take these medicines the morning of surgery with A SIP OF WATER   gabapentin (NEURONTIN) HYDROmorphone (DILAUDID)   tiZANidine (ZANAFLEX) topiramate (TOPAMAX) nasal spray If needed: ALPRAZolam Duanne Moron)  If needed: promethazine (PHENERGAN) for nausea or vomiting If needed: diphenhydrAMINE (BENADRYL) or hydrOXYzine (ATARAX/VISTARIL)  7 days prior to surgery STOP taking any Aspirin (unless otherwise instructed by your surgeon), Aleve, Naproxen, Ibuprofen, Motrin, Advil, diclofenac sodium (VOLTAREN), Goody's, BC's, all herbal medications, fish oil, and all vitamins.    The Morning of Surgery  Do not wear jewelry, make-up or nail polish.  Do not wear lotions, powders, or perfumes/colognes, or deodorant  Do not shave 48 hours prior to surgery.    Do not bring valuables to the hospital.  [facility] is not responsible for any belongings or valuables.  If you are a smoker, DO NOT Smoke 24 hours prior to surgery IF you wear a CPAP at night please bring your mask, tubing, and machine the morning of surgery   Remember that you must have someone to transport you home after your surgery, and remain with you for 24 hours if you are discharged the same day.   Contacts, glasses, hearing aids, dentures or bridgework may not be worn into surgery.    Leave your suitcase in the car.  After surgery it may be brought to your room.  For patients admitted to the hospital, discharge time  will be determined by your treatment team.  Patients discharged the day of surgery will not be allowed to drive home.    Special instructions:   [place]- Preparing For Surgery  Before surgery, you can play an important role. Because skin is not sterile, your skin needs to be as free of germs as possible. You can reduce the number of germs on your skin by washing with CHG (chlorahexidine gluconate) Soap before surgery.  CHG is an antiseptic cleaner which kills germs and bonds with the skin to continue killing germs even after washing.    Oral Hygiene is also important to reduce your risk of infection.  Remember - BRUSH YOUR TEETH THE MORNING OF SURGERY WITH YOUR REGULAR TOOTHPASTE  Please do not use if you have an allergy to CHG or antibacterial soaps. If your skin becomes reddened/irritated stop using the CHG.  Do not shave (including legs and underarms) for at least 48 hours prior to first CHG shower. It is OK to shave your face.  Please follow these instructions carefully.   1. Shower the NIGHT BEFORE SURGERY and the MORNING OF SURGERY with CHG Soap.   2. If you chose to wash your hair, wash your hair first as usual with your normal shampoo.  3. After you shampoo, rinse your hair and body thoroughly to remove the shampoo.  4. Use CHG as you would any other liquid soap. You can apply CHG directly to the skin and wash gently with a scrungie or  a clean washcloth.   5. Apply the CHG Soap to your body ONLY FROM THE NECK DOWN.  Do not use on open wounds or open sores. Avoid contact with your eyes, ears, mouth and genitals (private parts). Wash Face and genitals (private parts)  with your normal soap.   6. Wash thoroughly, paying special attention to the area where your surgery will be performed.  7. Thoroughly rinse your body with warm water from the neck down.  8. DO NOT shower/wash with your normal soap after using and rinsing off the CHG Soap.  9. Pat yourself dry with a CLEAN  TOWEL.  10. Wear CLEAN PAJAMAS to bed the night before surgery, wear comfortable clothes the morning of surgery  11. Place CLEAN SHEETS on your bed the night of your first shower and DO NOT SLEEP WITH PETS.    Day of Surgery:  Do not apply any deodorants/lotions.  Please wear clean clothes to the hospital/surgery center.   Remember to brush your teeth WITH YOUR REGULAR TOOTHPASTE.   Please read over the following fact sheets that you were given.

## 2018-07-19 NOTE — Progress Notes (Signed)
Pt denies SOB, chest pain, and being under the care of a cardiologist. Pt denies having a cardiac cath. Pt denies recent labs.Pt denies having and EKG but nurse requested chest x ray and LOV note from [facility]; awaiting results.  Spoke with Lattie Haw, Surgical Coordinantor, to make MD aware that pt stated that she needed prophylactic antibiotics on DOS due to " my artificial joints." Lattie Haw made aware of order for " no antibiotic use." Lattie Haw made aware that pt stated that she is usually given " a bottle of Amoxicillin on day of procedures ( dental and back).  Lattie Haw stated that someone will f/u with pt. Pt stated that PCP is now Cline Crock, [place], at [facility] since previous PCP left.  Pt denies that she and family members tested positive for COVID-19 (pt scheduled on 07/20/2018 and instructed to quarantine thereafter). Pt denies that she and family members experienced experienced the following symptoms:  Cough yes/no: No Fever (>100.73F)  yes/no: No Runny nose yes/no: No Sore throat yes/no: No Difficulty breathing/shortness of breath  yes/no: No  Have you or a family member traveled in the last 14 days and where? yes/no: No  Pt reminded that hospital visitation restrictions are in effect and the importance of the restrictions.  Pt verbalized understanding of all pre-op instructions.  Pt chart forwarded to PA, Anesthesiology, for review.

## 2018-07-20 ENCOUNTER — Other Ambulatory Visit (HOSPITAL_COMMUNITY)
Admission: RE | Admit: 2018-07-20 | Discharge: 2018-07-20 | Disposition: A | Discharge status: Home / Self Care | Payer: BC Managed Care – PPO | Source: Ambulatory Visit | Attending: Otolaryngology | Admitting: Otolaryngology

## 2018-07-20 DIAGNOSIS — Z1159 Encounter for screening for other viral diseases: Secondary | ICD-10-CM | POA: Insufficient documentation

## 2018-07-20 NOTE — Anesthesia Preprocedure Evaluation (Addendum)
Anesthesia Evaluation  Patient identified by MRN, date of birth, ID band Patient awake    Reviewed: Allergy & Precautions, NPO status , Patient's Chart, lab work & pertinent test results  History of Anesthesia Complications (+) PONV  Airway Mallampati: I  TM Distance: >3 FB Neck ROM: Full    Dental   Pulmonary former smoker,    Pulmonary exam normal        Cardiovascular hypertension, Pt. on medications Normal cardiovascular exam     Neuro/Psych Anxiety    GI/Hepatic   Endo/Other    Renal/GU      Musculoskeletal   Abdominal   Peds  Hematology   Anesthesia Other Findings   Reproductive/Obstetrics                            Anesthesia Physical Anesthesia Plan  ASA: II  Anesthesia Plan: General   Post-op Pain Management:    Induction: Intravenous  PONV Risk Score and Plan: 4 or greater and Ondansetron, Propofol infusion and Diphenhydramine  Airway Management Planned: Oral ETT  Additional Equipment:   Intra-op Plan:   Post-operative Plan: Extubation in OR  Informed Consent: I have reviewed the patients History and Physical, chart, labs and discussed the procedure including the risks, benefits and alternatives for the proposed anesthesia with the patient or authorized representative who has indicated his/her understanding and acceptance.       Plan Discussed with: CRNA and Surgeon  Anesthesia Plan Comments: (See PAT note by Karoline Caldwell, PA-C )       Anesthesia Quick Evaluation

## 2018-07-20 NOTE — Progress Notes (Signed)
Anesthesia Chart Review:  Case:  [id number] Date/Time:  07/24/18 1215   Procedure:  CERVICAL LYMPH NODE EXCISION (Left )   Anesthesia type:  General   Pre-op diagnosis:  enlarged left cervical lymphnode   Location:  [facility] OR ROOM 08 / [facility]   Surgeon:  Melida Quitter, MD     DISCUSSION:  53 yo female former smoker. She has a complicated medical history including PONV, hypertension, hyperlipidemia, hypothyroidism, fibromyalgia, complex regional pain syndrome (right lower extremity) on chronic opioids, avascular necrosis (jaw), migraines, TMJ, depression, anxiety, interstitial cystitis, chronic intermittent gross hematuria, frequent UTIs.  Evaluated by pulmonology in 2017 for chronic cough. Per last OV note 11/20/2015 "Unclear based on spirometry exactly how much asthma she may have. Her flow volume loop is consistent with fixed upper airway obstruction (not seen on bronchoscopy). This is functional and related to chronic irritation. Probably we can stop her Symbicort as it may be a contributor to upper airway irritation. She will continue albuterol as needed.Marland KitchenMarland KitchenMarland KitchenCough being driven by her upper airway irritation. She does not have overt GERD but she does have frequent regurgitation due to poor peristalsis and dysphasia. Suspect that this is a large contributor. The cough and UA obstruction are secondary."  Evaluated by cardiology in 2017 for chest pain. Per notes, she has a history of atypical chest pain. She had a negative treadmill stress test and was advised to f/u PRN. She also had an echo in 2014 that showed EF 55-60%, normal wall motion, normal valves.   Pt has a port a cath in place reportedly due to difficult IV access.  Anticipate she can proceed as planned barring acute status change.   VS: BP (!) 145/86   Pulse 71   Temp 36.7 C   Resp 18   Ht 5\' 3"  (1.6 m)   Wt 61.7 kg   SpO2 100%   BMI 24.09 kg/m   PROVIDERS: Cline Crock, PA-C is PCP   LABS: Labs reviewed: Acceptable for  surgery. (all labs ordered are listed, but only abnormal results are displayed)  Labs Reviewed  BASIC METABOLIC PANEL - Abnormal; Notable for the following components:      Result Value   Potassium 3.3 (*)    BUN 21 (*)    All other components within normal limits  CBC - Abnormal; Notable for the following components:   WBC 10.7 (*)    All other components within normal limits     EKG: 07/19/18: Normal sinus rhythm. Rate 65. Nonspecific T wave abnormality  CV: Exercise toelrance test 05/08/2015:  Upsloping ST segment depression ST segment depression of 1 mm was noted during stress.  No T wave inversion was noted during stress.  Overall, the patient's exercise capacity was normal.  [facility] Treadmill Score: intermediate and low risk   Negative stress test without evidence of ischemia at given workload.  TTE 09/22/2012: Study Conclusions   - Left ventricle: The cavity size was normal. Wall thickness  was normal. Systolic function was normal. The estimated  ejection fraction was in the range of 55% to 60%. Wall  motion was normal; there were no regional wall motion  abnormalities. Left ventricular diastolic function  parameters were normal.  - Atrial septum: No defect or patent foramen ovale was  identified.  - Pericardium, extracardiac: A trivial pericardial effusion  was identified posterior to the heart.  Transthoracic echocardiography. M-mode, complete 2D,  spectral Doppler, and color Doppler. Height: Height:  160cm. Height: 63in. Weight: Weight: 63.5kg. Weight:  139.7lb. Body mass index: BMI: 24.8kg/m^2. Body surface  area: BSA: 1.49m^2. Blood pressure: 110/70. Patient  status: Inpatient. Location: Echo laboratory.    Past Medical History:  Diagnosis Date  . Anemia    Slight  . Anxiety   . Arthritis    right thumb  . Asthma   . Avascular necrosis (HCC)    left shoulder, right knee, jaws/mouth that she knows of at this time  . Basal cell carcinoma   . Chronic  migraine without aura, intractable, with status migrainosus    neurologist-  dr Jaynee Eagles--  receives botox injections every 3 months  . Chronic, continuous use of opioids   . Complication of anesthesia    "not asleep and aware of everything"  . Degenerative disc disease, cervical   . Degenerative disc disease, lumbar   . Delayed gastric emptying   . Difficult intravenous access    Now has Powerport for access  . Difficulty swallowing pills   . Enlarged lymph node in neck    left  . Esophageal dysmotility   . Family history of adverse reaction to anesthesia    family - itching, hives, PONV  . Family history of breast cancer 08/29/2017  . Family history of leukemia 08/29/2017  . Family history of ovarian cancer 08/29/2017  . Family history of prostate cancer 08/29/2017  . Family history of thyroid cancer 08/29/2017  . Fatty liver    PMH  . Fibromyalgia   . H/O blood transfusion reaction    x 3; last one caused hives an d cellulitis  . Hemangioma of liver   . Hemangioma, renal   . Hiatal hernia   . History of exercise stress test 05/08/2015   negative stress test without evidence of ischemia at given workload  . History of kidney stones 04/2014  . History of skin cancer    excision melanoma - scalp;  excision behind knee  . Hypertension   . IC (interstitial cystitis)    urologist-  dr Louis Meckel  . Irritable bowel syndrome (IBS)   . Knee pain, right    states gets "nerves burned" periodically  . Limited joint range of motion    right knee - unable to fully extend right leg  . Melanoma ([place])   . Memory impairment    due to pain   . Osteoporosis   . Pain syndrome, chronic   . Pneumonia    history of X2  . PONV (postoperative nausea and vomiting)    uncontrollable itching with anesthesia; hx. of anesthesia awareness; pt request needs 50  mg of Benadryl IV/phenergan 50 mg IV  . Port-A-Cath in place 11/04/2016  . Post-surgical hypothyroidism   . Sciatica    bilateral  . Seasonal  allergies   . Squamous carcinoma     Past Surgical History:  Procedure Laterality Date  . ANTERIOR CERVICAL DECOMP/DISCECTOMY FUSION  03/10/2009   C5-6, C6-7  . ANTERIOR CERVICAL DECOMP/DISCECTOMY FUSION N/A 07/16/2015   Procedure: ANTERIOR CERVICAL DECOMPRESSION FUSION 4-5 WITH INSTRUMENTATION AND AUTOGRAFT;  Surgeon: Phylliss Bob, MD;  Location: [place];  Service: Orthopedics;  Laterality: N/A;  ANTERIOR CERVICAL DECOMPRESSION FUSION 4-5 WITH INSTRUMENTATION AND ALLOGRAFT  . APPENDECTOMY  ~ 1992  . AUGMENTATION MAMMAPLASTY Bilateral 1996; 2001, 2016  . BREAST SURGERY  age 49   accessory breast tissue exc  . CARPAL TUNNEL RELEASE Bilateral right 07/26/2001;  left ?  Mortimer Fries SUSPENSION PLASTY Right 02/10/2015   Procedure: RIGHT THUMB SUSPENSION  ARTHROPLASTY;  Surgeon: Shanon Brow  Grandville Silos, MD;  Location: [place];  Service: Orthopedics;  Laterality: Right;  ANESTHESIA: PRE- OP BLOCK  . CYSTO WITH HYDRODISTENSION  01/29/2011   Procedure: CYSTOSCOPY/HYDRODISTENSION;  Surgeon: Claybon Jabs, MD;  Location: [facility];  Service: Urology;  Laterality: N/A;  . CYSTO WITH HYDRODISTENSION N/A 05/08/2014   Procedure: CYSTOSCOPY/HYDRODISTENSION WITH BILATERAL RETROGRADES;  Surgeon: Ardis Hughs, MD;  Location: [facility];  Service: Urology;  Laterality: N/A;  . CYSTO WITH HYDRODISTENSION N/A 03/27/2015   Procedure: CYSTOSCOPY/HYDRODISTENSION OF BLADDER, INSTILLATION OF MARCAINE/PYRIDIUM;  Surgeon: Ardis Hughs, MD;  Location: [facility];  Service: Urology;  Laterality: N/A;  . CYSTO/  BLADDER BX  03/08/2008  . CYSTO/ URETHRAL DILATION/ HYDRODISTENTION/ BLADDER BX  04/10/2003  . CYSTOSCOPY W/ RETROGRADES  01/29/2011   Procedure: CYSTOSCOPY WITH RETROGRADE PYELOGRAM;  Surgeon: Claybon Jabs, MD;  Location: [facility];  Service: Urology;  Laterality: Bilateral;  . CYSTOSCOPY WITH HYDRODISTENSION AND BIOPSY N/A  06/29/2017   Procedure: CYSTOSCOPY/HYDRODISTENSION;  Surgeon: Ardis Hughs, MD;  Location: [facility];  Service: Urology;  Laterality: N/A;  . CYSTOSCOPY WITH STENT PLACEMENT  2006   URETERAL  . DEBRIDEMENT RIGHT THUMB MP JOINT Right 04/10/2002  . DECOMPRESSION LEFT ULNAR NERVE, ELBOW Left 01/29/2003   2nd time in 2015  . ESOPHAGOGASTRODUODENOSCOPY (EGD) WITH ESOPHAGEAL DILATION  10-26-2000  . EUS N/A 04/23/2016   Procedure: UPPER ENDOSCOPIC ULTRASOUND (EUS) LINEAR;  Surgeon: Carol Ada, MD;  Location: [facility];  Service: Endoscopy;  Laterality: N/A;  . EXCISION HAGLUND'S DEFORMITY WITH ACHILLES TENDON REPAIR Bilateral 2013-2014  . FISSURECTOMY  ~ 1988 x2  . GANGLION CYST EXCISION Right 02/10/2015   Procedure: REMOVAL GANGLION OF WRIST, PARTIAL ULNAR EXCISION;  Surgeon: Milly Jakob, MD;  Location: [place];  Service: Orthopedics;  Laterality: Right;  . IR FLUORO GUIDE PORT INSERTION RIGHT  11/04/2016  . IR US GUIDE VASC ACCESS RIGHT  11/04/2016  . KIDNEY STONE SURGERY    . KNEE ARTHROSCOPY Right 08/16/2000  . KNEE ARTHROSCOPY WITH FULKERSON SLIDE Right 11/29/2000  . LAPAROSCOPIC ASSISTED VAGINAL HYSTERECTOMY  Feb 1999  . LAPAROSCOPIC CHOLECYSTECTOMY  03/04/2002  . LAPAROSCOPIC LYSIS OF ADHESIONS  02/11/2012   Procedure: LAPAROSCOPIC LYSIS OF ADHESIONS;  Surgeon: Cheri Fowler, MD;  Location[facility];  Service: Gynecology;;  . LAPAROSCOPY  02/11/2012   Procedure: LAPAROSCOPY DIAGNOSTIC;  Surgeon: Cheri Fowler, MD;  Location: [facility];  Service: Gynecology;  Laterality: N/A;  Diagnostic  Operative Laparoscopic   . LAPAROSCOPY ADHESIOLYSIS AND REMOVAL POSSIBLE RIGHT OVARY REMNANT  04/18/2000  . LAPAROSCOPY LYSIS ADHESIONS/  BILATERAL SALPINGOOPHORECTOMY/  [place] OF ENDOMETRIOSIS  1998  . MELANOMA EXCISION Left 2013   "behind knee"   . NASAL SINUS SURGERY  2013  . NEGATIVE SLEEP STUDY  2006 approx .  per pt  . REFRACTIVE SURGERY Bilateral ~ 1999;  ~ 2013  . RIGHT THUMB FUSION OF MPJ Right 10/03/2002  . RIGHT TOTAL KNEE REVISION ARTHROPLASTY Right 03/14/2007  . SHOULDER ARTHROSCOPY Left    put back in place  . SHOULDER HEMI-ARTHROPLASTY Left 12/04/2015   Procedure: SHOULDER HEMI-ARTHROPLASTY;  Surgeon: Tania Ade, MD;  Location: [place];  Service: Orthopedics;  Laterality: Left;  Left shoulder hemiarthroplasty  . sympathetic nerve ablation  07/05/2017  . TONSILLECTOMY AND ADENOIDECTOMY  ~ 1989  . TOTAL KNEE ARTHROPLASTY Right 09/10/2003; 12/08/2012  . TOTAL THYROIDECTOMY  1993  . TRANSANAL HEMORRHOIDAL DEARTERIALIZATION  03/26/2011  . TRANSTHORACIC  ECHOCARDIOGRAM  09/22/2012  . TRIGGER FINGER RELEASE Left 07/06/2016   Procedure: LEFT THUMB TRIGGER RELEASE;  Surgeon: Milly Jakob, MD;  Location: [place];  Service: Orthopedics;  Laterality: Left;  Marland Kitchen VIDEO BRONCHOSCOPY Bilateral 10/20/2015   Procedure: VIDEO BRONCHOSCOPY WITHOUT FLUORO;  Surgeon: Collene Gobble, MD;  Location: [place];  Service: Cardiopulmonary;  Laterality: Bilateral;    MEDICATIONS: . ALPRAZolam (XANAX) 0.5 MG tablet  . cyanocobalamin (,VITAMIN B-12,) 1000 MCG/ML injection  . diclofenac sodium (VOLTAREN) 1 % GEL  . diphenhydrAMINE (BENADRYL) 50 MG tablet  . Erenumab-aooe (AIMOVIG) 140 MG/ML SOAJ  . estradiol (ESTRACE) 0.5 MG tablet  . EVZIO 0.4 MG/0.4ML SOAJ  . fluticasone (FLONASE) 50 MCG/ACT nasal spray  . Fremanezumab-vfrm (AJOVY) 225 MG/1.5ML SOSY  . gabapentin (NEURONTIN) 300 MG capsule  . hydrochlorothiazide (MICROZIDE) 12.5 MG capsule  . HYDROmorphone (DILAUDID) 2 MG tablet  . hydrOXYzine (ATARAX/VISTARIL) 25 MG tablet  . ipratropium (ATROVENT) 0.06 % nasal spray  . ketoconazole (NIZORAL) 2 % shampoo  . levothyroxine (SYNTHROID, LEVOTHROID) 50 MCG tablet  . Multiple Vitamin (MULTIVITAMIN WITH MINERALS) TABS tablet  . NON FORMULARY  . OnabotulinumtoxinA (BOTOX IJ)  . promethazine (PHENERGAN) 25 MG tablet  . SUMAtriptan (IMITREX)  6 MG/0.5ML SOLN injection  . tiZANidine (ZANAFLEX) 4 MG tablet  . topiramate (TOPAMAX) 25 MG tablet  . vitamin C (ASCORBIC ACID) 500 MG tablet  . zolpidem (AMBIEN) 10 MG tablet   No current facility-administered medications for this encounter.    Wynonia Musty [facility] Short Stay Center/Anesthesiology Phone [phone] 07/21/2018 3:43 PM

## 2018-07-21 LAB — NOVEL CORONAVIRUS, NAA (HOSP ORDER, SEND-OUT TO REF LAB; TAT 18-24 HRS): SARS-CoV-2, NAA: NOT DETECTED

## 2018-07-24 ENCOUNTER — Ambulatory Visit (HOSPITAL_COMMUNITY): Payer: BC Managed Care – PPO | Admitting: Physician Assistant

## 2018-07-24 ENCOUNTER — Encounter (HOSPITAL_COMMUNITY)
Admission: RE | Disposition: A | Discharge status: Home / Self Care | Payer: Self-pay | Source: Home / Self Care | Attending: Otolaryngology

## 2018-07-24 ENCOUNTER — Other Ambulatory Visit: Payer: Self-pay

## 2018-07-24 ENCOUNTER — Encounter (HOSPITAL_COMMUNITY): Payer: Self-pay | Admitting: Certified Registered"

## 2018-07-24 ENCOUNTER — Ambulatory Visit (HOSPITAL_COMMUNITY): Payer: BC Managed Care – PPO | Admitting: Certified Registered"

## 2018-07-24 ENCOUNTER — Ambulatory Visit (HOSPITAL_COMMUNITY)
Admission: RE | Admit: 2018-07-24 | Discharge: 2018-07-24 | Disposition: A | Discharge status: Home / Self Care | Payer: BC Managed Care – PPO | Attending: Otolaryngology | Admitting: Otolaryngology

## 2018-07-24 DIAGNOSIS — Z85820 Personal history of malignant melanoma of skin: Secondary | ICD-10-CM | POA: Diagnosis not present

## 2018-07-24 DIAGNOSIS — Z87891 Personal history of nicotine dependence: Secondary | ICD-10-CM | POA: Insufficient documentation

## 2018-07-24 DIAGNOSIS — J45909 Unspecified asthma, uncomplicated: Secondary | ICD-10-CM | POA: Insufficient documentation

## 2018-07-24 DIAGNOSIS — Z91010 Allergy to peanuts: Secondary | ICD-10-CM | POA: Insufficient documentation

## 2018-07-24 DIAGNOSIS — G43909 Migraine, unspecified, not intractable, without status migrainosus: Secondary | ICD-10-CM | POA: Diagnosis not present

## 2018-07-24 DIAGNOSIS — Z79899 Other long term (current) drug therapy: Secondary | ICD-10-CM | POA: Insufficient documentation

## 2018-07-24 DIAGNOSIS — M797 Fibromyalgia: Secondary | ICD-10-CM | POA: Diagnosis not present

## 2018-07-24 DIAGNOSIS — Z793 Long term (current) use of hormonal contraceptives: Secondary | ICD-10-CM | POA: Insufficient documentation

## 2018-07-24 DIAGNOSIS — K589 Irritable bowel syndrome without diarrhea: Secondary | ICD-10-CM | POA: Diagnosis not present

## 2018-07-24 DIAGNOSIS — E890 Postprocedural hypothyroidism: Secondary | ICD-10-CM | POA: Diagnosis not present

## 2018-07-24 DIAGNOSIS — Z881 Allergy status to other antibiotic agents status: Secondary | ICD-10-CM | POA: Diagnosis not present

## 2018-07-24 DIAGNOSIS — Z91048 Other nonmedicinal substance allergy status: Secondary | ICD-10-CM | POA: Insufficient documentation

## 2018-07-24 DIAGNOSIS — N3010 Interstitial cystitis (chronic) without hematuria: Secondary | ICD-10-CM | POA: Insufficient documentation

## 2018-07-24 DIAGNOSIS — Z91041 Radiographic dye allergy status: Secondary | ICD-10-CM | POA: Diagnosis not present

## 2018-07-24 DIAGNOSIS — Z8719 Personal history of other diseases of the digestive system: Secondary | ICD-10-CM | POA: Diagnosis not present

## 2018-07-24 DIAGNOSIS — Z9102 Food additives allergy status: Secondary | ICD-10-CM | POA: Insufficient documentation

## 2018-07-24 DIAGNOSIS — R590 Localized enlarged lymph nodes: Secondary | ICD-10-CM | POA: Insufficient documentation

## 2018-07-24 DIAGNOSIS — M5136 Other intervertebral disc degeneration, lumbar region: Secondary | ICD-10-CM | POA: Insufficient documentation

## 2018-07-24 DIAGNOSIS — Z888 Allergy status to other drugs, medicaments and biological substances status: Secondary | ICD-10-CM | POA: Diagnosis not present

## 2018-07-24 DIAGNOSIS — Z885 Allergy status to narcotic agent status: Secondary | ICD-10-CM | POA: Insufficient documentation

## 2018-07-24 DIAGNOSIS — M810 Age-related osteoporosis without current pathological fracture: Secondary | ICD-10-CM | POA: Diagnosis not present

## 2018-07-24 DIAGNOSIS — Z91040 Latex allergy status: Secondary | ICD-10-CM | POA: Insufficient documentation

## 2018-07-24 DIAGNOSIS — Z882 Allergy status to sulfonamides status: Secondary | ICD-10-CM | POA: Insufficient documentation

## 2018-07-24 DIAGNOSIS — K449 Diaphragmatic hernia without obstruction or gangrene: Secondary | ICD-10-CM | POA: Insufficient documentation

## 2018-07-24 DIAGNOSIS — I10 Essential (primary) hypertension: Secondary | ICD-10-CM | POA: Diagnosis not present

## 2018-07-24 DIAGNOSIS — Z883 Allergy status to other anti-infective agents status: Secondary | ICD-10-CM | POA: Diagnosis not present

## 2018-07-24 DIAGNOSIS — F419 Anxiety disorder, unspecified: Secondary | ICD-10-CM | POA: Diagnosis not present

## 2018-07-24 HISTORY — PX: LYMPH NODE BIOPSY: SHX201

## 2018-07-24 SURGERY — LYMPH NODE BIOPSY
Anesthesia: General | Site: Neck | Laterality: Left

## 2018-07-24 MED ORDER — FENTANYL CITRATE (PF) 250 MCG/5ML IJ SOLN
INTRAMUSCULAR | Status: AC
  Filled 2018-07-24: qty 5

## 2018-07-24 MED ORDER — PROPOFOL 1000 MG/100ML IV EMUL
INTRAVENOUS | Status: AC
  Filled 2018-07-24: qty 100

## 2018-07-24 MED ORDER — LACTATED RINGERS IV SOLN
INTRAVENOUS | Status: DC
  Administered 2018-07-24: 11:00:00 via INTRAVENOUS

## 2018-07-24 MED ORDER — PROPOFOL 500 MG/50ML IV EMUL
INTRAVENOUS | Status: DC | PRN
  Administered 2018-07-24: 125 ug/kg/min via INTRAVENOUS

## 2018-07-24 MED ORDER — PROPOFOL 10 MG/ML IV BOLUS
INTRAVENOUS | Status: AC
  Filled 2018-07-24: qty 20

## 2018-07-24 MED ORDER — DIPHENHYDRAMINE HCL 50 MG/ML IJ SOLN
INTRAMUSCULAR | Status: DC | PRN
  Administered 2018-07-24: 25 mg via INTRAVENOUS

## 2018-07-24 MED ORDER — ONDANSETRON HCL 4 MG/2ML IJ SOLN
INTRAMUSCULAR | Status: DC | PRN
  Administered 2018-07-24: 4 mg via INTRAVENOUS

## 2018-07-24 MED ORDER — MIDAZOLAM HCL 2 MG/2ML IJ SOLN
INTRAMUSCULAR | Status: DC | PRN
  Administered 2018-07-24: 2 mg via INTRAVENOUS

## 2018-07-24 MED ORDER — SODIUM CHLORIDE 0.9 % IV SOLN
2.0000 g | INTRAVENOUS | Status: AC
  Administered 2018-07-24: 2 g via INTRAVENOUS
  Filled 2018-07-24: qty 2000

## 2018-07-24 MED ORDER — MIDAZOLAM HCL 2 MG/2ML IJ SOLN
INTRAMUSCULAR | Status: AC
  Filled 2018-07-24: qty 2

## 2018-07-24 MED ORDER — PROPOFOL 10 MG/ML IV BOLUS
INTRAVENOUS | Status: DC | PRN
  Administered 2018-07-24: 130 mg via INTRAVENOUS

## 2018-07-24 MED ORDER — LIDOCAINE-EPINEPHRINE 1 %-1:100000 IJ SOLN
INTRAMUSCULAR | Status: DC | PRN
  Administered 2018-07-24: 2 mL

## 2018-07-24 MED ORDER — FENTANYL CITRATE (PF) 100 MCG/2ML IJ SOLN
INTRAMUSCULAR | Status: DC | PRN
  Administered 2018-07-24: 100 ug via INTRAVENOUS
  Administered 2018-07-24: 50 ug via INTRAVENOUS

## 2018-07-24 MED ORDER — PROMETHAZINE HCL 25 MG/ML IJ SOLN
INTRAMUSCULAR | Status: DC | PRN
  Administered 2018-07-24: 25 mg via INTRAVENOUS

## 2018-07-24 MED ORDER — ONDANSETRON HCL 4 MG/2ML IJ SOLN
INTRAMUSCULAR | Status: AC
  Filled 2018-07-24: qty 2

## 2018-07-24 MED ORDER — ROCURONIUM BROMIDE 10 MG/ML (PF) SYRINGE
PREFILLED_SYRINGE | INTRAVENOUS | Status: DC | PRN
  Administered 2018-07-24: 60 mg via INTRAVENOUS

## 2018-07-24 MED ORDER — 0.9 % SODIUM CHLORIDE (POUR BTL) OPTIME
TOPICAL | Status: DC | PRN
  Administered 2018-07-24: 1000 mL

## 2018-07-24 MED ORDER — HEPARIN SOD (PORK) LOCK FLUSH 100 UNIT/ML IV SOLN
500.0000 [IU] | INTRAVENOUS | Status: AC | PRN
  Administered 2018-07-24: 13:00:00 500 [IU]

## 2018-07-24 MED ORDER — BACITRACIN ZINC 500 UNIT/GM EX OINT
TOPICAL_OINTMENT | CUTANEOUS | Status: DC | PRN
  Administered 2018-07-24: 1 via TOPICAL

## 2018-07-24 MED ORDER — SUGAMMADEX SODIUM 200 MG/2ML IV SOLN
INTRAVENOUS | Status: DC | PRN
  Administered 2018-07-24: 200 mg via INTRAVENOUS

## 2018-07-24 SURGICAL SUPPLY — 37 items
BLADE SURG 15 STRL LF DISP TIS (BLADE) IMPLANT
BLADE SURG 15 STRL SS (BLADE)
CANISTER SUCT 3000ML PPV (MISCELLANEOUS) IMPLANT
CLEANER TIP ELECTROSURG 2X2 (MISCELLANEOUS) ×2 IMPLANT
CONT SPEC 4OZ CLIKSEAL STRL BL (MISCELLANEOUS) ×2 IMPLANT
COVER SURGICAL LIGHT HANDLE (MISCELLANEOUS) ×2 IMPLANT
COVER WAND RF STERILE (DRAPES) ×2 IMPLANT
CRADLE DONUT ADULT HEAD (MISCELLANEOUS) IMPLANT
DRAIN PENROSE 1/4X12 LTX STRL (WOUND CARE) IMPLANT
DRAPE HALF SHEET 40X57 (DRAPES) IMPLANT
DRSG EMULSION OIL 3X3 NADH (GAUZE/BANDAGES/DRESSINGS) IMPLANT
ELECT COATED BLADE 2.86 ST (ELECTRODE) ×2 IMPLANT
ELECT NEEDLE TIP 2.8 STRL (NEEDLE) IMPLANT
ELECT REM PT RETURN 9FT ADLT (ELECTROSURGICAL) ×2
ELECTRODE REM PT RTRN 9FT ADLT (ELECTROSURGICAL) ×1 IMPLANT
GAUZE SPONGE 4X4 12PLY STRL (GAUZE/BANDAGES/DRESSINGS) IMPLANT
GLOVE BIO SURGEON STRL SZ7.5 (GLOVE) ×2 IMPLANT
GOWN STRL REUS W/ TWL LRG LVL3 (GOWN DISPOSABLE) ×2 IMPLANT
GOWN STRL REUS W/TWL LRG LVL3 (GOWN DISPOSABLE) ×2
KIT BASIN OR (CUSTOM PROCEDURE TRAY) ×2 IMPLANT
KIT TURNOVER KIT B (KITS) IMPLANT
NEEDLE HYPO 25GX1X1/2 BEV (NEEDLE) IMPLANT
NS IRRIG 1000ML POUR BTL (IV SOLUTION) ×2 IMPLANT
PAD ARMBOARD 7.5X6 YLW CONV (MISCELLANEOUS) ×4 IMPLANT
PENCIL BUTTON HOLSTER BLD 10FT (ELECTRODE) ×2 IMPLANT
SUT CHROMIC 4 0 P 3 18 (SUTURE) IMPLANT
SUT ETHILON 4 0 PS 2 18 (SUTURE) ×2 IMPLANT
SUT ETHILON 5 0 P 3 18 (SUTURE)
SUT NYLON ETHILON 5-0 P-3 1X18 (SUTURE) IMPLANT
SUT SILK 4 0 (SUTURE)
SUT SILK 4-0 18XBRD TIE 12 (SUTURE) IMPLANT
SUT VIC AB 4-0 PS2 18 (SUTURE) ×2 IMPLANT
SWAB COLLECTION DEVICE MRSA (MISCELLANEOUS) IMPLANT
SWAB CULTURE ESWAB REG 1ML (MISCELLANEOUS) IMPLANT
SYR BULB IRRIGATION 50ML (SYRINGE) IMPLANT
SYR TB 1ML LUER SLIP (SYRINGE) IMPLANT
TRAY ENT MC OR (CUSTOM PROCEDURE TRAY) ×2 IMPLANT

## 2018-07-24 NOTE — Anesthesia Procedure Notes (Signed)
Procedure Name: Intubation Date/Time: 07/24/2018 11:25 AM Performed by: Barrington Ellison, CRNA Pre-anesthesia Checklist: Patient identified, Emergency Drugs available, Suction available and Patient being monitored Patient Re-evaluated:Patient Re-evaluated prior to induction Oxygen Delivery Method: Circle System Utilized Preoxygenation: Pre-oxygenation with 100% oxygen Induction Type: IV induction Laryngoscope Size: Glidescope and 3 Grade View: Grade I Tube type: Oral Number of attempts: 1 Airway Equipment and Method: Stylet and Oral airway Placement Confirmation: ETT inserted through vocal cords under direct vision,  positive ETCO2 and breath sounds checked- equal and bilateral Secured at: 21 cm Tube secured with: Tape Dental Injury: Teeth and Oropharynx as per pre-operative assessment  Difficulty Due To: Difficulty was anticipated, Difficult Airway- due to anterior larynx and Difficult Airway- due to limited oral opening

## 2018-07-24 NOTE — Discharge Instructions (Signed)

## 2018-07-24 NOTE — Anesthesia Postprocedure Evaluation (Signed)
Anesthesia Post Note  Patient: DANELLA PHILSON  Procedure(s) Performed: CERVICAL LYMPH NODE EXCISION (Left Neck)     Patient location during evaluation: PACU Anesthesia Type: General Level of consciousness: awake and alert Pain management: pain level controlled Vital Signs Assessment: post-procedure vital signs reviewed and stable Respiratory status: spontaneous breathing, nonlabored ventilation, respiratory function stable and patient connected to nasal cannula oxygen Cardiovascular status: blood pressure returned to baseline and stable Postop Assessment: no apparent nausea or vomiting Anesthetic complications: no    Last Vitals:  Vitals:   07/24/18 1228 07/24/18 1245  BP: 133/73 133/73  Pulse: 69 72  Resp: 13 12  Temp:  (!) 36.1 C  SpO2: 99% 99%    Last Pain:  Vitals:   07/24/18 1245  TempSrc:   PainSc: 2                  Lenford Beddow DAVID
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
## 2018-07-24 NOTE — Op Note (Signed)
NAME: Kari Green, HEWETT MEDICAL RECORD [id number] ACCOUNT [id number] DATE OF BIRTH:27-Jul-1965 FACILITY: [facility] LOCATION: [facility] PHYSICIAN:Symon Norwood Guido Sander, MD  OPERATIVE REPORT  DATE OF PROCEDURE:  07/24/2018  PREOPERATIVE DIAGNOSIS:  Left cervical lymphadenopathy.  POSTOPERATIVE DIAGNOSIS:  Left cervical lymphadenopathy.  PROCEDURE:  Excision of left cervical lymph nodes.  SURGEON:  Melida Quitter, MD  ANESTHESIA:  General endotracheal anesthesia.  COMPLICATIONS:  None.  INDICATIONS:  The patient is a 53 year old female who has had a persistent sore lymph node in the left lateral neck that has not responded to antibiotic therapy.  She presents for surgical excision.  FINDINGS:  A 1 cm lymph node was encountered and removed and sent for permanent pathology.  DESCRIPTION OF PROCEDURE:  The patient was identified in the holding room, informed consent having been obtained, including discussion of risks, benefits, and alternatives.  The patient was brought to the operative suite and put on the operative table in  supine position.  Anesthesia was induced, and the patient was intubated by the anesthesia team without difficulty.  The patient was given intravenous antibiotics during the case.  The eyes were taped closed, and the left neck incision was marked with a  marking pen and injected with 1% lidocaine with 1:100,000 epinephrine.  The left neck was prepped and draped in sterile fashion.  Incision was made with a 15-blade scalpel and extended through subcutaneous tissue using Bovie electrocautery.  Blunt  dissection was then performed down to the lymph node which was then carefully dissected from surrounding tissues using scissors.  The lymph node was passed to nursing for pathology.  Bleeding was controlled with Bovie electrocautery.  There was some more  exploration of the wound performed bluntly, but no additional lymph node was identified.  The wound was then copiously irrigated  with saline.  The subcutaneous layer was closed with 4-0 Vicryl suture in simple interrupted fashion.  The skin was closed  with 5-0 nylon in a simple running fashion.  Drapes were removed and the patient was cleaned off.  Bacitracin ointment was added to the incision.  She was returned to Anesthesia for wakeup.  The patient was extubated and taken to the recovery room in  stable condition.  LN/NUANCE  D:07/24/2018 T:07/24/2018 JOB:[id number]

## 2018-07-24 NOTE — Brief Op Note (Signed)
07/24/2018  11:53 AM  PATIENT:  Kari Green  53 y.o. female  PRE-OPERATIVE DIAGNOSIS:  enlarged left cervical lymphnode  POST-OPERATIVE DIAGNOSIS:  enlarged left cervical lymphnode  PROCEDURE:  Procedure(s): CERVICAL LYMPH NODE EXCISION (Left)  SURGEON:  Surgeon(s) and Role:    Melida Quitter, MD - Primary  PHYSICIAN ASSISTANT:   ASSISTANTS: none   ANESTHESIA:   general  EBL:  Minimal   BLOOD ADMINISTERED:none  DRAINS: none   LOCAL MEDICATIONS USED:  LIDOCAINE   SPECIMEN:  Source of Specimen:  Left upper zone 5 node  DISPOSITION OF SPECIMEN:  PATHOLOGY  COUNTS:  YES  TOURNIQUET:  * No tourniquets in log *  DICTATION: .Other Dictation: Dictation Number [id number]  PLAN OF CARE: Discharge to home after PACU  PATIENT DISPOSITION:  PACU - hemodynamically stable.   Delay start of Pharmacological VTE agent (>24hrs) due to surgical blood loss or risk of bleeding: no

## 2018-07-24 NOTE — Transfer of Care (Signed)
Immediate Anesthesia Transfer of Care Note  Patient: Kari Green  Procedure(s) Performed: CERVICAL LYMPH NODE EXCISION (Left Neck)  Patient Location: PACU  Anesthesia Type:General  Level of Consciousness: awake, alert  and oriented  Airway & Oxygen Therapy: Patient Spontanous Breathing  Post-op Assessment: Report given to RN  Post vital signs: Reviewed and stable  Last Vitals:  Vitals Value Taken Time  BP    Temp    Pulse 74 07/24/2018 12:11 PM  Resp 17 07/24/2018 12:11 PM  SpO2 100 % 07/24/2018 12:11 PM  Vitals shown include unvalidated device data.  Last Pain:  Vitals:   07/24/18 1038  TempSrc:   PainSc: 0-No pain         Complications: No apparent anesthesia complications

## 2018-07-25 ENCOUNTER — Encounter (HOSPITAL_COMMUNITY): Payer: Self-pay | Admitting: Otolaryngology

## 2018-07-25 ENCOUNTER — Ambulatory Visit (INDEPENDENT_AMBULATORY_CARE_PROVIDER_SITE_OTHER): Payer: BC Managed Care – PPO | Admitting: Neurology

## 2018-07-25 VITALS — Temp 98.4°F

## 2018-07-25 DIAGNOSIS — G43711 Chronic migraine without aura, intractable, with status migrainosus: Secondary | ICD-10-CM | POA: Diagnosis not present

## 2018-07-25 DIAGNOSIS — G43001 Migraine without aura, not intractable, with status migrainosus: Secondary | ICD-10-CM | POA: Diagnosis not present

## 2018-07-25 MED ORDER — PROMETHAZINE HCL 25 MG RE SUPP: 25.0000 mg | Freq: Four times a day (QID) | RECTAL | 6 refills | Status: DC | PRN

## 2018-07-25 MED ORDER — KETOROLAC TROMETHAMINE 60 MG/2ML IM SOLN
60.0000 mg | Freq: Once | INTRAMUSCULAR | Status: AC
  Administered 2018-07-25: 60 mg via INTRAMUSCULAR

## 2018-07-25 NOTE — Progress Notes (Signed)
Botox- 100 units x 2 vials Lot: [id number] Expiration: 01/2021 NDC: 8412-8208-13  Bacteriostatic 0.9% Sodium Chloride- 33mL total Lot: [id number] Expiration: 11/23/2018 NDC: 9747-1855-01  Dx: T86.825 S/P  Pt given Toradol 60 mg IM per order. Aseptic technique used. Bandaid applied. See MAR.  Infusion order written per Dr. Jaynee Eagles for Depacon 1 gram IV x 1 and Compazine 10 mg IV x 1. Orders signed and taken by MD to infusion suite along with ins info & allergy/med list.

## 2018-07-25 NOTE — Progress Notes (Signed)
Interval history 07/25/2018: [facility] in the past augmented her botox but could not have it approved, will try Ajovy. Prior to Botox her migraines were daily continuous. Since Starting Botox her migraines are 50% improved (from daily to 15 headaches days a month of those 8 being migrainous). She still has a burden of headaches even after >50% improvement with botox recommend Ajovy concurrently. She sees Dr. Jacelyn Grip for occipital and S1 RFA and goes to Dr. Harl Favor in [place] as he is the only one to do RFA on nerves of the knee Her migraines worsening since we are delayed on botox due to Covid19 injections will send to infusion today for migraine.  [id number]  Corrugators, lateral corrugators, end of brown, 77 bunny lines and 4 spots x 4 spots OO, masseters  She vomits and can't take anything orally, try phenergan rectally Meds ordered this encounter  Medications   ketorolac (TORADOL) injection 60 mg   promethazine (PHENERGAN) 25 MG suppository    Sig: Place 1 suppository (25 mg total) rectally every 6 (six) hours as needed for nausea or vomiting.    Dispense:  12 each    Refill:  6   Interval history 07/11/2017:  + masseters, + temples, + eyes, did not do traps used the extra on the forehead, lateral corrugators, end of brow  She is 75% better as far as frequency and severity of migraines.    Interval history 04/05/2017: She has shingles today and she is in pain in her right hip. She has had shinges multiple times. She has had the flu as well and she was in the bed sick and she had some headaches. Her baseline is daily migraines and since her botox (this is her third shot) she has only had one very bad migraine, tremendous improvement. She has a headache today but she is sick.      Consent Form Botulism Toxin Injection For Chronic Migraine    Reviewed orally with patient, additionally signature is on file:  Botulism toxin has been approved by the Federal drug administration for treatment  of chronic migraine. Botulism toxin does not cure chronic migraine and it may not be effective in some patients.  The administration of botulism toxin is accomplished by injecting a small amount of toxin into the muscles of the neck and head. Dosage must be titrated for each individual. Any benefits resulting from botulism toxin tend to wear off after 3 months with a repeat injection required if benefit is to be maintained. Injections are usually done every 3-4 months with maximum effect peak achieved by about 2 or 3 weeks. Botulism toxin is expensive and you should be sure of what costs you will incur resulting from the injection.  The side effects of botulism toxin use for chronic migraine may include:   -Transient, and usually mild, facial weakness with facial injections  -Transient, and usually mild, head or neck weakness with head/neck injections  -Reduction or loss of forehead facial animation due to forehead muscle weakness  -Eyelid drooping  -Dry eye  -Pain at the site of injection or bruising at the site of injection  -Double vision  -Potential unknown long term risks  Contraindications: You should not have Botox if you are pregnant, nursing, allergic to albumin, have an infection, skin condition, or muscle weakness at the site of the injection, or have myasthenia gravis, Lambert-Eaton syndrome, or ALS.  It is also possible that as with any injection, there may be an allergic reaction or no effect  from the medication. Reduced effectiveness after repeated injections is sometimes seen and rarely infection at the injection site may occur. All care will be taken to prevent these side effects. If therapy is given over a long time, atrophy and wasting in the muscle injected may occur. Occasionally the patient's become refractory to treatment because they develop antibodies to the toxin. In this event, therapy needs to be modified.  I have read the above information and consent to the  administration of botulism toxin.    BOTOX PROCEDURE NOTE FOR MIGRAINE HEADACHE    Contraindications and precautions discussed with patient(above). Aseptic procedure was observed and patient tolerated procedure. Procedure performed by Dr. Georgia Dom  The condition has existed for more than 6 months, and pt does not have a diagnosis of ALS, Myasthenia Gravis or Lambert-Eaton Syndrome.  Risks and benefits of injections discussed and pt agrees to proceed with the procedure.  Written consent obtained  These injections are medically necessary. Pt  receives good benefits from these injections. These injections do not cause sedations or hallucinations which the oral therapies may cause.  Description of procedure:  The patient was placed in a sitting position. The standard protocol was used for Botox as follows, with 5 units of Botox injected at each site:   -Procerus muscle, midline injection  -Corrugator muscle, bilateral injection  -Frontalis muscle, bilateral injection, with 2 sites each side, medial injection was performed in the upper one third of the frontalis muscle, in the region vertical from the medial inferior edge of the superior orbital rim. The lateral injection was again in the upper one third of the forehead vertically above the lateral limbus of the cornea, 1.5 cm lateral to the medial injection site.  - Levator Scapulae: 5 units bilaterally  -Temporalis muscle injection, 5 sites, bilaterally. The first injection was 3 cm above the tragus of the ear, second injection site was 1.5 cm to 3 cm up from the first injection site in line with the tragus of the ear. The third injection site was 1.5-3 cm forward between the first 2 injection sites. The fourth injection site was 1.5 cm posterior to the second injection site. 5th site laterally in the temporalis  muscleat the level of the outer canthus.  - Patient feels her clenching is a trigger for headaches. +5 units masseter  bilaterally   - Patient feels the migraines are centered around the eyes +5 units bilaterally at the outer canthus in the orbicularis occuli  -Occipitalis muscle injection, 3 sites, bilaterally. The first injection was done one half way between the occipital protuberance and the tip of the mastoid process behind the ear. The second injection site was done lateral and superior to the first, 1 fingerbreadth from the first injection. The third injection site was 1 fingerbreadth superiorly and medially from the first injection site.  -Cervical paraspinal muscle injection, 2 sites, bilateral knee first injection site was 1 cm from the midline of the cervical spine, 3 cm inferior to the lower border of the occipital protuberance. The second injection site was 1.5 cm superiorly and laterally to the first injection site.  -Trapezius muscle injection was performed at 3 sites, bilaterally. The first injection site was in the upper trapezius muscle halfway between the inflection point of the neck, and the acromion. The second injection site was one half way between the acromion and the first injection site. The third injection was done between the first injection site and the inflection point of the neck.  Will return for repeat injection in 3 months.   A 200 unit sof Botox was used, any Botox not injected was wasted. The patient tolerated the procedure well, there were no complications of the above procedure.

## 2018-07-25 NOTE — Patient Instructions (Signed)
Promethazine suppositories What is this medicine? PROMETHAZINE (proe METH a zeen) is an antihistamine. It is used to treat allergic reactions and to treat or prevent nausea and vomiting from illness or motion sickness. It is also used to make you sleep before surgery, and to help treat pain or nausea after surgery. This medicine may be used for other purposes; ask your health care provider or pharmacist if you have questions. COMMON BRAND NAME(S): Phenadoz, Phenergan, Promethegan What should I tell my health care provider before I take this medicine? They need to know if you have any of these conditions: -glaucoma -high blood pressure or heart disease -kidney disease -liver disease -lung or breathing disease, like asthma -prostate trouble -pain or difficulty passing urine -seizures -an unusual or allergic reaction to promethazine or phenothiazines, other medicines, foods, dyes, or preservatives -pregnant or trying to get pregnant -breast-feeding How should I use this medicine? This medicine is for rectal use only. Do not take by mouth. Wash your hands before and after use. Take off the foil wrapping. Wet the tip of the suppository with cold tap water to make it easier to use. Lie on your side with your lower leg straightened out and your upper leg bent forward toward your stomach. Lift upper buttock to expose the rectal area. Apply gentle pressure to insert the suppository completely into the rectum, pointed end first. Hold buttocks together for a few seconds. Remain lying down for about 15 minutes to avoid having the suppository come out. Do not use more often than directed. Talk to your pediatrician regarding the use of this medicine in children. Special care may be needed. This medicine should not be given to infants and children younger than 104 years old. Overdosage: If you think you have taken too much of this medicine contact a poison control center or emergency room at once. NOTE: This  medicine is only for you. Do not share this medicine with others. What if I miss a dose? If you miss a dose, use it as soon as you can. If it is almost time for your next dose, use only that dose. Do not use double doses. What may interact with this medicine? Do not take this medicine with any of the following medications: -cisapride -dofetilide -dronedarone -MAOIs like Carbex, Eldepryl, Marplan, Nardil, Parnate -pimozide -quinidine, including dextromethorphan; quinidine -thioridazine -ziprasidone This medicine may also interact with the following medications: -certain medicines for depression, anxiety, or psychotic disturbances -certain medicines for anxiety or sleep -certain medicines for seizures like carbamazepine, phenobarbital, phenytoin -certain medicines for movement abnormalities as in Parkinson's disease, or for gastrointestinal problems -epinephrine -medicines for allergies or colds -muscle relaxants -narcotic medicines for pain -other medicines that prolong the QT interval (cause an abnormal heart rhythm) -tramadol -trimethobenzamide This list may not describe all possible interactions. Give your health care provider a list of all the medicines, herbs, non-prescription drugs, or dietary supplements you use. Also tell them if you smoke, drink alcohol, or use illegal drugs. Some items may interact with your medicine. What should I watch for while using this medicine? Tell your doctor or health care professional if your symptoms do not start to get better in 1 to 2 days. You may get drowsy or dizzy. Do not drive, use machinery, or do anything that needs mental alertness until you know how this medicine affects you. To reduce the risk of dizzy or fainting spells, do not stand or sit up quickly, especially if you are an older patient. Alcohol may  increase dizziness and drowsiness. Avoid alcoholic drinks. Your mouth may get dry. Chewing sugarless gum or sucking hard candy, and  drinking plenty of water may help. Contact your doctor if the problem does not go away or is severe. This medicine may cause dry eyes and blurred vision. If you wear contact lenses you may feel some discomfort. Lubricating drops may help. See your eye doctor if the problem does not go away or is severe. This medicine can make you more sensitive to the sun. Keep out of the sun. If you cannot avoid being in the sun, wear protective clothing and use sunscreen. Do not use sun lamps or tanning beds/booths. If you are diabetic, check your blood-sugar levels regularly. What side effects may I notice from receiving this medicine? Side effects that you should report to your doctor or health care professional as soon as possible: -blurred vision -irregular heartbeat, palpitations or chest pain -muscle or facial twitches -pain or difficulty passing urine -seizures -skin rash -slowed or shallow breathing -unusual bleeding or bruising -yellowing of the eyes or skin Side effects that usually do not require medical attention (report to your doctor or health care professional if they continue or are bothersome): -headache -nightmares, agitation, nervousness, excitability, not able to sleep (these are more likely in children) -stuffy nose This list may not describe all possible side effects. Call your doctor for medical advice about side effects. You may report side effects to FDA at 1-800-FDA-1088. Where should I keep my medicine? Keep out of the reach of children. Store in a refrigerator between 2 and 8 degrees C (36 and 46 degrees F). Throw away any unused medicine after the expiration date. NOTE: This sheet is a summary. It may not cover all possible information. If you have questions about this medicine, talk to your doctor, pharmacist, or health care provider.  2019 [employer] (2012-10-11 15:57:29)

## 2018-08-15 ENCOUNTER — Telehealth: Payer: Self-pay | Admitting: Neurology

## 2018-08-15 NOTE — Telephone Encounter (Signed)
Noted, thank you

## 2018-08-15 NOTE — Telephone Encounter (Signed)
Pt called in and confirmed appt date and time

## 2018-08-15 NOTE — Telephone Encounter (Signed)
I called to schedule the patient for her Botox apt but she did not answer. I left a VM asking her to call back. I went ahead and schedule an apt for her, if she calls back please confirm this date and time works.

## 2018-08-19 DIAGNOSIS — L03211 Cellulitis of face: Secondary | ICD-10-CM | POA: Diagnosis not present

## 2018-08-21 DIAGNOSIS — R000 Tachycardia, unspecified: Secondary | ICD-10-CM | POA: Diagnosis not present

## 2018-08-21 DIAGNOSIS — S00561D Insect bite (nonvenomous) of lip, subsequent encounter: Secondary | ICD-10-CM | POA: Diagnosis not present

## 2018-08-21 DIAGNOSIS — W57XXXD Bitten or stung by nonvenomous insect and other nonvenomous arthropods, subsequent encounter: Secondary | ICD-10-CM | POA: Diagnosis not present

## 2018-08-21 DIAGNOSIS — L03211 Cellulitis of face: Secondary | ICD-10-CM | POA: Diagnosis not present

## 2018-08-24 ENCOUNTER — Encounter: Payer: Self-pay | Admitting: Cardiology

## 2018-08-28 ENCOUNTER — Ambulatory Visit (INDEPENDENT_AMBULATORY_CARE_PROVIDER_SITE_OTHER): Payer: BC Managed Care – PPO | Admitting: Cardiology

## 2018-08-28 ENCOUNTER — Ambulatory Visit: Payer: Medicare Other

## 2018-08-28 ENCOUNTER — Other Ambulatory Visit: Payer: Self-pay

## 2018-08-28 ENCOUNTER — Encounter: Payer: Self-pay | Admitting: Cardiology

## 2018-08-28 VITALS — BP 122/82 | HR 79 | Ht 63.0 in | Wt 138.0 lb

## 2018-08-28 DIAGNOSIS — R002 Palpitations: Secondary | ICD-10-CM

## 2018-08-28 DIAGNOSIS — R000 Tachycardia, unspecified: Secondary | ICD-10-CM | POA: Insufficient documentation

## 2018-08-28 NOTE — Progress Notes (Signed)
Patient referred by Jani Gravel, MD for tachycardia  Subjective:   Kari Green, female    DOB: 1965-04-23, 53 y.o.   MRN: [id number]   Chief Complaint  Patient presents with   Tachycardia   New Patient (Initial Visit)    HPI  53 y.o. caucasian female with asthma, chronic migraines, h/o avascular necrosis, hypothyroidism, h/o basal cell carcinoma and melanoma.  Patient recently started exercise. She denies any angina, dyspnea symptoms. She reportedly had episode of HR in 270s (?seen on monitor). She reports random episodes of palpitations lasting for few seconds. She denies any presyncope or syncope symptoms.    Past Medical History:  Diagnosis Date   Anemia    Slight   Anxiety    Arthritis    right thumb   Asthma    Avascular necrosis (HCC)    left shoulder, right knee, jaws/mouth that she knows of at this time   Basal cell carcinoma    Chronic migraine without aura, intractable, with status migrainosus    neurologist-  dr Jaynee Eagles--  receives botox injections every 3 months   Chronic, continuous use of opioids    Complication of anesthesia    "not asleep and aware of everything"   Degenerative disc disease, cervical    Degenerative disc disease, lumbar    Delayed gastric emptying    Difficult intravenous access    Now has [employer] for access   Difficulty swallowing pills    Enlarged lymph node in neck    left   Esophageal dysmotility    Family history of adverse reaction to anesthesia    family - itching, hives, PONV   Family history of breast cancer 08/29/2017   Family history of leukemia 08/29/2017   Family history of ovarian cancer 08/29/2017   Family history of prostate cancer 08/29/2017   Family history of thyroid cancer 08/29/2017   Fatty liver    PMH   Fibromyalgia    H/O blood transfusion reaction    x 3; last one caused hives an d cellulitis   Hemangioma of liver    Hemangioma, renal    Hiatal hernia    History of  exercise stress test 05/08/2015   negative stress test without evidence of ischemia at given workload   History of kidney stones 04/2014   History of skin cancer    excision melanoma - scalp;  excision behind knee   Hypertension    IC (interstitial cystitis)    urologist-  dr Louis Meckel   Irritable bowel syndrome (IBS)    Knee pain, right    states gets "nerves burned" periodically   Limited joint range of motion    right knee - unable to fully extend right leg   Melanoma ([place])    Memory impairment    due to pain    Osteoporosis    Pain syndrome, chronic    Pneumonia    history of X2   PONV (postoperative nausea and vomiting)    uncontrollable itching with anesthesia; hx. of anesthesia awareness; pt request needs 50  mg of Benadryl IV/phenergan 50 mg IV   Port-A-Cath in place 11/04/2016   Post-surgical hypothyroidism    Sciatica    bilateral   Seasonal allergies    Squamous carcinoma      Past Surgical History:  Procedure Laterality Date   ANTERIOR CERVICAL DECOMP/DISCECTOMY FUSION  03/10/2009   C5-6, C6-7   ANTERIOR CERVICAL DECOMP/DISCECTOMY FUSION N/A 07/16/2015   Procedure: ANTERIOR CERVICAL DECOMPRESSION FUSION  4-5 WITH INSTRUMENTATION AND AUTOGRAFT;  Surgeon: Phylliss Bob, MD;  Location: [place];  Service: Orthopedics;  Laterality: N/A;  ANTERIOR CERVICAL DECOMPRESSION FUSION 4-5 WITH INSTRUMENTATION AND ALLOGRAFT   APPENDECTOMY  ~ [place] Bilateral 1996; 2001, 2016   BREAST SURGERY  age 53   accessory breast tissue exc   CARPAL TUNNEL RELEASE Bilateral right 07/26/2001;  left ?   CARPOMETACARPEL SUSPENSION PLASTY Right 02/10/2015   Procedure: RIGHT THUMB SUSPENSION  ARTHROPLASTY;  Surgeon: Milly Jakob, MD;  Location: [place];  Service: Orthopedics;  Laterality: Right;  ANESTHESIA: PRE- OP BLOCK   CYSTO WITH HYDRODISTENSION  01/29/2011   Procedure: CYSTOSCOPY/HYDRODISTENSION;  Surgeon: Claybon Jabs, MD;   Location: [facility];  Service: Urology;  Laterality: N/A;   CYSTO WITH HYDRODISTENSION N/A 05/08/2014   Procedure: CYSTOSCOPY/HYDRODISTENSION WITH BILATERAL RETROGRADES;  Surgeon: Ardis Hughs, MD;  Location: [facility];  Service: Urology;  Laterality: N/A;   CYSTO WITH HYDRODISTENSION N/A 03/27/2015   Procedure: CYSTOSCOPY/HYDRODISTENSION OF BLADDER, INSTILLATION OF MARCAINE/PYRIDIUM;  Surgeon: Ardis Hughs, MD;  Location: [facility];  Service: Urology;  Laterality: N/A;   CYSTO/  BLADDER BX  03/08/2008   CYSTO/ URETHRAL DILATION/ HYDRODISTENTION/ BLADDER BX  04/10/2003   CYSTOSCOPY W/ RETROGRADES  01/29/2011   Procedure: CYSTOSCOPY WITH RETROGRADE PYELOGRAM;  Surgeon: Claybon Jabs, MD;  Location: [facility];  Service: Urology;  Laterality: Bilateral;   CYSTOSCOPY WITH HYDRODISTENSION AND BIOPSY N/A 06/29/2017   Procedure: CYSTOSCOPY/HYDRODISTENSION;  Surgeon: Ardis Hughs, MD;  Location: [facility];  Service: Urology;  Laterality: N/A;   CYSTOSCOPY WITH STENT PLACEMENT  2006   URETERAL   DEBRIDEMENT RIGHT THUMB MP JOINT Right 04/10/2002   DECOMPRESSION LEFT ULNAR NERVE, ELBOW Left 01/29/2003   2nd time in 2015   ESOPHAGOGASTRODUODENOSCOPY (EGD) WITH ESOPHAGEAL DILATION  10-26-2000   EUS N/A 04/23/2016   Procedure: UPPER ENDOSCOPIC ULTRASOUND (EUS) LINEAR;  Surgeon: Carol Ada, MD;  Location: [facility];  Service: Endoscopy;  Laterality: N/A;   EXCISION HAGLUND'S DEFORMITY WITH ACHILLES TENDON REPAIR Bilateral 2013-2014   FISSURECTOMY  ~ 1988 x2   GANGLION CYST EXCISION Right 02/10/2015   Procedure: REMOVAL GANGLION OF WRIST, PARTIAL ULNAR EXCISION;  Surgeon: Milly Jakob, MD;  Location: [place];  Service: Orthopedics;  Laterality: Right;   IR FLUORO GUIDE PORT INSERTION RIGHT  11/04/2016   IR US GUIDE VASC ACCESS RIGHT  11/04/2016   KIDNEY STONE SURGERY      KNEE ARTHROSCOPY Right 08/16/2000   KNEE ARTHROSCOPY WITH FULKERSON SLIDE Right 11/29/2000   LAPAROSCOPIC ASSISTED VAGINAL HYSTERECTOMY  Feb 1999   LAPAROSCOPIC CHOLECYSTECTOMY  03/04/2002   LAPAROSCOPIC LYSIS OF ADHESIONS  02/11/2012   Procedure: LAPAROSCOPIC LYSIS OF ADHESIONS;  Surgeon: Cheri Fowler, MD;  Location: [facility];  Service: Gynecology;;   LAPAROSCOPY  02/11/2012   Procedure: LAPAROSCOPY DIAGNOSTIC;  Surgeon: Cheri Fowler, MD;  Location: [facility];  Service: Gynecology;  Laterality: N/A;  Diagnostic  Operative Laparoscopic    LAPAROSCOPY ADHESIOLYSIS AND REMOVAL POSSIBLE RIGHT OVARY REMNANT  04/18/2000   LAPAROSCOPY LYSIS ADHESIONS/  BILATERAL SALPINGOOPHORECTOMY/  FULGERATION OF ENDOMETRIOSIS  1998   LYMPH NODE BIOPSY Left 07/24/2018   Procedure: CERVICAL LYMPH NODE EXCISION;  Surgeon: Melida Quitter, MD;  Location: [place];  Service: ENT;  Laterality: Left;   MELANOMA EXCISION Left 2013   "behind knee"    NASAL SINUS SURGERY  2013   NEGATIVE SLEEP STUDY  2006 approx .  per pt   REFRACTIVE SURGERY Bilateral ~ 1999; ~ 2013   RIGHT THUMB FUSION OF MPJ Right 10/03/2002   RIGHT TOTAL KNEE REVISION ARTHROPLASTY Right 03/14/2007   SHOULDER ARTHROSCOPY Left    put back in place   SHOULDER HEMI-ARTHROPLASTY Left 12/04/2015   Procedure: SHOULDER HEMI-ARTHROPLASTY;  Surgeon: Tania Ade, MD;  Location: [place];  Service: Orthopedics;  Laterality: Left;  Left shoulder hemiarthroplasty   sympathetic nerve ablation  07/05/2017   TONSILLECTOMY AND ADENOIDECTOMY  ~ [place] Right 09/10/2003; 12/08/2012   TOTAL THYROIDECTOMY  1993   TRANSANAL HEMORRHOIDAL DEARTERIALIZATION  03/26/2011   TRANSTHORACIC ECHOCARDIOGRAM  09/22/2012   TRIGGER FINGER RELEASE Left 07/06/2016   Procedure: LEFT THUMB TRIGGER RELEASE;  Surgeon: Milly Jakob, MD;  Location: [place];  Service: Orthopedics;  Laterality: Left;   VIDEO BRONCHOSCOPY Bilateral 10/20/2015     Procedure: VIDEO BRONCHOSCOPY WITHOUT FLUORO;  Surgeon: Collene Gobble, MD;  Location: [employer];  Service: Cardiopulmonary;  Laterality: Bilateral;     Social History   Socioeconomic History   Marital status: Married    Spouse name: Not on file   Number of children: 1   Years of education: Not on file   Highest education level: Not on file  Occupational History   Not on file  Social Needs   Financial resource strain: Not on file   Food insecurity    Worry: Not on file    Inability: Not on file   Transportation needs    Medical: Not on file    Non-medical: Not on file  Tobacco Use   Smoking status: Former Smoker    Types: Cigarettes   Smokeless tobacco: Never Used   Tobacco comment: stopped in 2019  Substance and Sexual Activity   Alcohol use: Not Currently    Alcohol/week: 0.0 standard drinks    Comment: rarely   Drug use: No   Sexual activity: Not on file  Lifestyle   Physical activity    Days per week: Not on file    Minutes per session: Not on file   Stress: Not on file  Relationships   Social connections    Talks on phone: Not on file    Gets together: Not on file    Attends religious service: Not on file    Active member of club or organization: Not on file    Attends meetings of clubs or organizations: Not on file    Relationship status: Not on file   Intimate partner violence    Fear of current or ex partner: Not on file    Emotionally abused: Not on file    Physically abused: Not on file    Forced sexual activity: Not on file  Other Topics Concern   Not on file  Social History Narrative   Lives with husband.       Family History  Problem Relation Age of Onset   Anesthesia problems Mother    Leukemia Mother 62   Ovarian cancer Mother 94   Cervical cancer Daughter        cervical   Breast cancer Maternal Aunt        age dx unk   Skin cancer Maternal Grandmother    Throat cancer Maternal Grandfather    Thyroid  cancer Maternal Aunt    Lung cancer Maternal Aunt    Cervical cancer Maternal Aunt    Cervical cancer Maternal Aunt  Thyroid cancer Maternal Uncle    Cancer Maternal Aunt        sinus   Prostate cancer Other    Brain cancer Cousin    Other Cousin        brain tumor   Leukemia Cousin    Bone cancer Cousin    Cervical cancer Cousin      Current Outpatient Medications on File Prior to Visit  Medication Sig Dispense Refill   potassium chloride (MICRO-K) 10 MEQ CR capsule Take 10 mEq by mouth 2 (two) times daily.     ALPRAZolam (XANAX) 0.5 MG tablet Take 1 mg by mouth 3 (three) times daily as needed. Anxiety     cyanocobalamin (,VITAMIN B-12,) 1000 MCG/ML injection Inject 1,000 mcg into the muscle every Tuesday. Reported on 03/25/2015     diclofenac sodium (VOLTAREN) 1 % GEL Apply 1 application topically 4 (four) times daily as needed for pain.     diphenhydrAMINE (BENADRYL) 50 MG tablet Take 50 mg by mouth every 4 (four) hours as needed for itching.      Erenumab-aooe (AIMOVIG) 140 MG/ML SOAJ Inject 140 mg into the skin every 30 (thirty) days. 3 pen 0   estradiol (ESTRACE) 0.5 MG tablet Take 1 mg by mouth at bedtime.      EVZIO 0.4 MG/0.4ML SOAJ AUTO INJECT AS NEEDED IN CASE OF EMERGENCY WHERE GRANDCHILDREN MAY GET A HOLD OF PATIENTS PAIN MEDICATIONS.  0   fluticasone (FLONASE) 50 MCG/ACT nasal spray SPRAY 2 SPRAYS INTO EACH NOSTRIL DAILY (Patient taking differently: Place 2 sprays into both nostrils daily. ) 16 g 0   gabapentin (NEURONTIN) 300 MG capsule Take 900 mg by mouth 3 (three) times daily.      hydrochlorothiazide (MICROZIDE) 12.5 MG capsule Take 12.5 mg by mouth daily.     HYDROmorphone (DILAUDID) 2 MG tablet Take 2 mg by mouth every 4 (four) hours as needed for severe pain.     hydrOXYzine (ATARAX/VISTARIL) 25 MG tablet Take 50 mg by mouth every 4 (four) hours as needed for itching. Alternates with benadryl      ipratropium (ATROVENT) 0.06 % nasal  spray Place 2 sprays into both nostrils 3 (three) times daily.     ketoconazole (NIZORAL) 2 % shampoo One application once a month as needed for flaking  3   levothyroxine (SYNTHROID, LEVOTHROID) 50 MCG tablet Take 200 mcg by mouth daily. Pt can only take name brand     Multiple Vitamin (MULTIVITAMIN WITH MINERALS) TABS tablet Take 1 tablet by mouth at bedtime.     NON FORMULARY 1 mL by Subdermal route every Tuesday. Allergy vaccine     OnabotulinumtoxinA (BOTOX IJ) Inject as directed every 3 (three) months. For migraines     promethazine (PHENERGAN) 25 MG suppository Place 1 suppository (25 mg total) rectally every 6 (six) hours as needed for nausea or vomiting. 12 each 6   promethazine (PHENERGAN) 25 MG tablet Take 1-2 tablets (25-50 mg total) by mouth 2 (two) times daily as needed for nausea or vomiting. 20 tablet 0   tiZANidine (ZANAFLEX) 4 MG tablet Take 4 mg by mouth 3 (three) times daily.      topiramate (TOPAMAX) 25 MG tablet Take 200 mg by mouth 2 (two) times daily.   1   vitamin C (ASCORBIC ACID) 500 MG tablet Take 1,000 mg by mouth daily.     zolpidem (AMBIEN) 10 MG tablet Take 10 mg by mouth at bedtime.      No current  facility-administered medications on file prior to visit.     Cardiovascular studies:  07/2010  Pro B Natriuretic peptide (BNP) 0 - 125 pg/mL 39.0     08/2012  2D ECHO: Study Conclusions  - Left ventricle: The cavity size was normal. Wall thickness was normal. Systolic function was normal. The estimated ejection fraction was in the range of 55% to 60%. Wall motion was normal; there were no regional wall motion abnormalities. Left ventricular diastolic function parameters were normal. - Atrial septum: No defect or patent foramen ovale was identified. - Pericardium, extracardiac: A trivial pericardial effusion was identified posterior to the heart.  08/2012  Troponin <0.3 x3   08/2013  Troponin I <0.30 ng/mL <0.30    CV: Exercise  tolerance test 05/08/2015:  Upsloping ST segment depression ST segment depression of 1 mm was noted during stress.  No T wave inversion was noted during stress.  Overall, the patient's exercise capacity was normal.  [facility] Treadmill Score: intermediate and low risk   Recent labs: CBC    Component Value Date/Time   WBC 10.7 (H) 07/19/2018 1530   RBC 4.28 07/19/2018 1530   HGB 12.7 07/19/2018 1530   HCT 38.3 07/19/2018 1530   PLT 316 07/19/2018 1530   MCV 89.5 07/19/2018 1530   MCH 29.7 07/19/2018 1530   MCHC 33.2 07/19/2018 1530   RDW 13.0 07/19/2018 1530   LYMPHSABS 2.0 10/02/2017 1452   MONOABS 0.7 10/02/2017 1452   EOSABS 0.2 10/02/2017 1452   BASOSABS 0.0 10/02/2017 1452   BMP Latest Ref Rng & Units 07/19/2018 10/02/2017 03/29/2016  Glucose 70 - 99 mg/dL 88 85 86  BUN 6 - 20 mg/dL 21(H) 19 20  Creatinine 0.44 - 1.00 mg/dL 0.86 0.84 0.86  Sodium 135 - 145 mmol/L 140 140 141  Potassium 3.5 - 5.1 mmol/L 3.3(L) 3.3(L) 3.4(L)  Chloride 98 - 111 mmol/L 107 108 111  CO2 22 - 32 mmol/L 23 25 22   Calcium 8.9 - 10.3 mg/dL 9.2 8.5(L) 9.2     Review of Systems  Constitution: Negative for decreased appetite, malaise/fatigue, weight gain and weight loss.  HENT: Negative for congestion.   Eyes: Negative for visual disturbance.  Cardiovascular: Positive for palpitations. Negative for chest pain, dyspnea on exertion, leg swelling and syncope.  Respiratory: Negative for cough.   Endocrine: Negative for cold intolerance.  Hematologic/Lymphatic: Does not bruise/bleed easily.  Skin: Negative for itching and rash.  Musculoskeletal: Negative for myalgias.  Gastrointestinal: Negative for abdominal pain, nausea and vomiting.  Genitourinary: Negative for dysuria.  Neurological: Negative for dizziness and weakness.  Psychiatric/Behavioral: The patient is not nervous/anxious.   All other systems reviewed and are negative.       Vitals:   08/28/18 1255  BP: 122/82  Pulse: 79  SpO2: 99%      Body mass index is 24.45 kg/m. Filed Weights   08/28/18 1255  Weight: 138 lb (62.6 kg)    Objective:   Physical Exam  Constitutional: She is oriented to person, place, and time. She appears well-developed and well-nourished. No distress.  HENT:  Head: Normocephalic and atraumatic.  Eyes: Pupils are equal, round, and reactive to light. Conjunctivae are normal.  Neck: No JVD present.  Cardiovascular: Normal rate, regular rhythm and intact distal pulses.  Pulmonary/Chest: Effort normal and breath sounds normal. She has no wheezes. She has no rales.  Abdominal: Soft. Bowel sounds are normal. There is no rebound.  Musculoskeletal:        General: No edema.  Lymphadenopathy:    She has no cervical adenopathy.  Neurological: She is alert and oriented to person, place, and time. No cranial nerve deficit.  Skin: Skin is warm and dry.  Psychiatric: She has a normal mood and affect.  Nursing note and vitals reviewed.         Assessment & Recommendations:   53 y.o. caucasian female with asthma, chronic migraines, h/o avascular necrosis, hypothyroidism, h/o basal cell carcinoma and melanoma, referred for evaluation of tachycardia.  Tachycardia: It is unclear to me where this episode was noted. HR 271 without syncope, most likely suggests artifact on whichever monitor this was recorded on. I would ideally like to place an event monitor, but she is allergic to any adhesives. I will obtain an echocardiogram to rule out any structural cardiac abnormality.   Nigel Mormon, MD [facility] Pager: [phone] Office: [phone] If no answer Cell [phone]

## 2018-08-31 DIAGNOSIS — J3489 Other specified disorders of nose and nasal sinuses: Secondary | ICD-10-CM | POA: Diagnosis not present

## 2018-09-01 ENCOUNTER — Encounter: Payer: Self-pay | Admitting: Hematology and Oncology

## 2018-09-12 DIAGNOSIS — F325 Major depressive disorder, single episode, in full remission: Secondary | ICD-10-CM | POA: Diagnosis not present

## 2018-09-12 DIAGNOSIS — R05 Cough: Secondary | ICD-10-CM | POA: Diagnosis not present

## 2018-09-12 DIAGNOSIS — J449 Chronic obstructive pulmonary disease, unspecified: Secondary | ICD-10-CM | POA: Diagnosis not present

## 2018-09-12 DIAGNOSIS — R6889 Other general symptoms and signs: Secondary | ICD-10-CM | POA: Diagnosis not present

## 2018-09-13 ENCOUNTER — Other Ambulatory Visit: Payer: Medicare Other

## 2018-09-14 ENCOUNTER — Other Ambulatory Visit: Payer: Self-pay | Admitting: Internal Medicine

## 2018-09-14 ENCOUNTER — Other Ambulatory Visit (HOSPITAL_COMMUNITY)
Admission: RE | Admit: 2018-09-14 | Discharge: 2018-09-14 | Disposition: A | Discharge status: Home / Self Care | Payer: BC Managed Care – PPO | Source: Other Acute Inpatient Hospital | Attending: Otolaryngology | Admitting: Otolaryngology

## 2018-09-14 DIAGNOSIS — Z20822 Contact with and (suspected) exposure to covid-19: Secondary | ICD-10-CM

## 2018-09-14 DIAGNOSIS — J3489 Other specified disorders of nose and nasal sinuses: Secondary | ICD-10-CM | POA: Insufficient documentation

## 2018-09-14 DIAGNOSIS — J449 Chronic obstructive pulmonary disease, unspecified: Secondary | ICD-10-CM | POA: Diagnosis not present

## 2018-09-14 DIAGNOSIS — R6889 Other general symptoms and signs: Secondary | ICD-10-CM | POA: Diagnosis not present

## 2018-09-14 DIAGNOSIS — R05 Cough: Secondary | ICD-10-CM | POA: Diagnosis not present

## 2018-09-14 DIAGNOSIS — K1379 Other lesions of oral mucosa: Secondary | ICD-10-CM | POA: Diagnosis not present

## 2018-09-14 LAB — SEDIMENTATION RATE: Sed Rate: 15 mm/hr (ref 0–22)

## 2018-09-15 LAB — ANCA TITERS
Atypical P-ANCA titer: <1:20 {titer}
C-ANCA: <1:20 {titer}
P-ANCA: <1:20 {titer}

## 2018-09-15 LAB — RHEUMATOID ARTHRITIS PROFILE
CCP Antibodies IgG/IgA: 5 units (ref 0–19)
Rheumatoid fact SerPl-aCnc: <10 IU/mL (ref 0.0–13.9)

## 2018-09-15 LAB — ANTINUCLEAR ANTIBODIES, IFA: ANA Ab, IFA: NEGATIVE

## 2018-09-17 LAB — NOVEL CORONAVIRUS, NAA: SARS-CoV-2, NAA: NOT DETECTED

## 2018-09-20 ENCOUNTER — Ambulatory Visit: Payer: Medicare Other | Admitting: Cardiology

## 2018-09-26 DIAGNOSIS — J3489 Other specified disorders of nose and nasal sinuses: Secondary | ICD-10-CM | POA: Diagnosis not present

## 2018-09-27 ENCOUNTER — Other Ambulatory Visit: Payer: Self-pay

## 2018-09-27 ENCOUNTER — Ambulatory Visit (INDEPENDENT_AMBULATORY_CARE_PROVIDER_SITE_OTHER): Payer: BC Managed Care – PPO

## 2018-09-27 DIAGNOSIS — R002 Palpitations: Secondary | ICD-10-CM | POA: Diagnosis not present

## 2018-09-27 DIAGNOSIS — Z0189 Encounter for other specified special examinations: Secondary | ICD-10-CM | POA: Diagnosis not present

## 2018-09-28 ENCOUNTER — Encounter: Payer: Self-pay | Admitting: Hematology and Oncology

## 2018-09-29 NOTE — Telephone Encounter (Signed)
Addressed with the patient.

## 2018-09-29 NOTE — Telephone Encounter (Signed)
From pt

## 2018-10-02 DIAGNOSIS — L299 Pruritus, unspecified: Secondary | ICD-10-CM | POA: Diagnosis not present

## 2018-10-03 NOTE — Telephone Encounter (Signed)
Please read

## 2018-10-03 NOTE — Telephone Encounter (Signed)
I don't understand the message. Please ask her if she is able to see the pdf attached with the echcoardiogram result.  10/03/18

## 2018-10-03 NOTE — Telephone Encounter (Signed)
Please email/mail a copy of echo report to the patient.

## 2018-10-04 ENCOUNTER — Other Ambulatory Visit: Payer: Medicare Other

## 2018-10-09 ENCOUNTER — Encounter: Payer: Self-pay | Admitting: *Deleted

## 2018-10-09 DIAGNOSIS — Z79899 Other long term (current) drug therapy: Secondary | ICD-10-CM | POA: Diagnosis not present

## 2018-10-09 DIAGNOSIS — G43001 Migraine without aura, not intractable, with status migrainosus: Secondary | ICD-10-CM | POA: Diagnosis not present

## 2018-10-09 NOTE — Telephone Encounter (Signed)
Spoke with Dr. Jaynee Eagles. Received orders for Depacon 1 gram IV x 1, Solumedrol 250 mg IV x1, Compazine 10 mg IV x 1, and Toradol 30 mg IV x 1. Orders signed and given to Leann in infusion along with copy of allergy list and insurance cards.

## 2018-10-09 NOTE — Telephone Encounter (Signed)
Patient returned my call. Spoke with Marylin Crosby RN in infusion suite as well. Pt will come after her 10:15 appt. Anticipate just after 11 AM. Pt has had an infusion before and agrees to receive the same medications as prior if ordered by Dr. Jaynee Eagles. Allergies updated. Pt now allergic to tea. She stated she had a depomedrol shot last week due to itching.   Allergy list and insurance card printed. Order sheet ready to be reviewed and signed by Dr. Jaynee Eagles.

## 2018-10-09 NOTE — Telephone Encounter (Signed)
Spoke with Dr. Jaynee Eagles who will offer migraine cocktail this morning. I spoke with infusion RN who said pt can come now. I have called the pt but LVM asking for call back. Will need to confirm if pt can come now or if we need to schedule for later. Left office number in message.

## 2018-10-11 ENCOUNTER — Telehealth: Payer: Medicare Other | Admitting: Cardiology

## 2018-10-11 ENCOUNTER — Other Ambulatory Visit: Payer: Self-pay | Admitting: Neurology

## 2018-10-11 DIAGNOSIS — M797 Fibromyalgia: Secondary | ICD-10-CM | POA: Diagnosis not present

## 2018-10-11 DIAGNOSIS — M8700 Idiopathic aseptic necrosis of unspecified bone: Secondary | ICD-10-CM | POA: Diagnosis not present

## 2018-10-11 DIAGNOSIS — Z79899 Other long term (current) drug therapy: Secondary | ICD-10-CM | POA: Diagnosis not present

## 2018-10-11 DIAGNOSIS — F419 Anxiety disorder, unspecified: Secondary | ICD-10-CM | POA: Diagnosis not present

## 2018-10-18 DIAGNOSIS — J309 Allergic rhinitis, unspecified: Secondary | ICD-10-CM | POA: Diagnosis not present

## 2018-10-19 NOTE — Telephone Encounter (Signed)
Received authorization form, sent signed and completed form back. DW

## 2018-10-24 ENCOUNTER — Telehealth: Payer: Self-pay | Admitting: Neurology

## 2018-10-24 ENCOUNTER — Ambulatory Visit: Payer: Medicare Other | Admitting: Neurology

## 2018-10-24 ENCOUNTER — Telehealth: Payer: Self-pay | Admitting: *Deleted

## 2018-10-24 DIAGNOSIS — J309 Allergic rhinitis, unspecified: Secondary | ICD-10-CM | POA: Diagnosis not present

## 2018-10-24 NOTE — Telephone Encounter (Signed)
Romelle Starcher, lets see if we can get her an infusion tomorrow. thanks

## 2018-10-24 NOTE — Telephone Encounter (Signed)
I spoke with the patient to schedule her Botox she was upset about the apt offer but took the first available. Patient would like to come in for Migraine infusion tomorrow. Please call and advise.

## 2018-10-24 NOTE — Telephone Encounter (Signed)
Spoke with Leanne RN in infusion who confirmed pt can be infused tomorrow morning.

## 2018-10-24 NOTE — Telephone Encounter (Signed)
I spoke with the patient. She will come tomorrow at noon for infusion. Pt stated allergies have not changed since last time she was here. She is in agreement with repeating the meds given last time if chosen by Dr. Jaynee Eagles. Pt will have a driver. She understands a mask is required and covid19 screening questions/temp will be completed at door. I offered for pt to see Amy NP earlier for botox. Pt preferred to wait for Dr. Jaynee Eagles. She verbalized appreciation for the call.   Order form ready for completion and signature. Allergies, ins/demographic info printed to include with order.

## 2018-10-24 NOTE — Telephone Encounter (Signed)
FYI_Pt called in and conversation started at 1:15pm. She stated that her appt was at 1pm and that she was holding for 86min. Pt was informed that her check in time was at 12-1215pm she stated that she never comes in at her check in time. I asked how could I help her and she wanted to know if she can still be seen. I asked the RN and she was informed by the provider if she was in the parking lot then yes. I got back on the phone with the pt and asked her where she was and she informed me that she was on her way. So at that time she was informed that she would have to r/s. Pt proceeded to ask if she can get an infusion but she did not have an infusion appt and they were full for the day.  Pt started escalating her voice saying she has Never been late, has never missed an appt and the one time she needs [place] she is turned away. Pt was informed that she would have to be transferred to be r/s. Pt once again asked if she can be seen today and I informed her that unfortunately her appt time has passed and the provider has already moved on to the next pt that was scheduled. Pt was then transferred to [facility] to be r/s for her BOTOX appt.

## 2018-10-24 NOTE — Telephone Encounter (Signed)
Pt no showed botox appt today.

## 2018-10-24 NOTE — Telephone Encounter (Signed)
Will chart in other phone note from 10/24/2018.

## 2018-10-24 NOTE — Telephone Encounter (Signed)
Spoke with Dr. Ahern. Received orders for Depacon 1 gram IV x 1, Solumedrol 250 mg IV x1, Compazine 10 mg IV x 1, and Toradol 30 mg IV x 1. Orders signed and given to Leann in infusion along with copy of allergy list and insurance cards.  

## 2018-10-25 ENCOUNTER — Encounter: Payer: Self-pay | Admitting: Neurology

## 2018-10-25 DIAGNOSIS — G43001 Migraine without aura, not intractable, with status migrainosus: Secondary | ICD-10-CM | POA: Diagnosis not present

## 2018-11-01 DIAGNOSIS — J309 Allergic rhinitis, unspecified: Secondary | ICD-10-CM | POA: Diagnosis not present

## 2018-11-02 DIAGNOSIS — H16223 Keratoconjunctivitis sicca, not specified as Sjogren's, bilateral: Secondary | ICD-10-CM | POA: Diagnosis not present

## 2018-11-06 DIAGNOSIS — D044 Carcinoma in situ of skin of scalp and neck: Secondary | ICD-10-CM | POA: Diagnosis not present

## 2018-11-13 ENCOUNTER — Ambulatory Visit (INDEPENDENT_AMBULATORY_CARE_PROVIDER_SITE_OTHER): Payer: BC Managed Care – PPO | Admitting: Neurology

## 2018-11-13 ENCOUNTER — Other Ambulatory Visit: Payer: Self-pay

## 2018-11-13 VITALS — Temp 98.4°F

## 2018-11-13 DIAGNOSIS — G43711 Chronic migraine without aura, intractable, with status migrainosus: Secondary | ICD-10-CM | POA: Diagnosis not present

## 2018-11-13 NOTE — Progress Notes (Signed)
Interval history 11/13/2018:  going on 10/7 for RFA and this may cause a severe migraine may need an infusion 10/8. Usually the next day she has a migraine. Otherwise doing great.   Interval history 07/25/2018: Aimovig in the past augmented her botox but could not have it approved, will try Ajovy. Prior to Botox her migraines were daily continuous. Since Starting Botox her migraines are 50% improved (from daily to 15 headaches days a month of those 8 being migrainous). She still has a burden of headaches even after >50% improvement with botox recommend Ajovy concurrently. She sees Dr. Jacelyn Grip for occipital and S1 RFA and goes to Dr. Harl Favor in [place] as he is the only one to do RFA on nerves of the knee Her migraines worsening since we are delayed on botox due to Covid19 injections will send to infusion today for migraine.  She vomits and can't take anything orally, try phenergan rectally No orders of the defined types were placed in this encounter.  Interval history 07/11/2017:  + masseters, + temples, + eyes, did not do traps used the extra on the forehead, lateral corrugators, end of brow  She is 75% better as far as frequency and severity of migraines.    Interval history 04/05/2017: She has shingles today and she is in pain in her right hip. She has had shinges multiple times. She has had the flu as well and she was in the bed sick and she had some headaches. Her baseline is daily migraines and since her botox (this is her third shot) she has only had one very bad migraine, tremendous improvement. She has a headache today but she is sick.      Consent Form Botulism Toxin Injection For Chronic Migraine    Reviewed orally with patient, additionally signature is on file:  Botulism toxin has been approved by the Federal drug administration for treatment of chronic migraine. Botulism toxin does not cure chronic migraine and it may not be effective in some patients.  The administration of  botulism toxin is accomplished by injecting a small amount of toxin into the muscles of the neck and head. Dosage must be titrated for each individual. Any benefits resulting from botulism toxin tend to wear off after 3 months with a repeat injection required if benefit is to be maintained. Injections are usually done every 3-4 months with maximum effect peak achieved by about 2 or 3 weeks. Botulism toxin is expensive and you should be sure of what costs you will incur resulting from the injection.  The side effects of botulism toxin use for chronic migraine may include:   -Transient, and usually mild, facial weakness with facial injections  -Transient, and usually mild, head or neck weakness with head/neck injections  -Reduction or loss of forehead facial animation due to forehead muscle weakness  -Eyelid drooping  -Dry eye  -Pain at the site of injection or bruising at the site of injection  -Double vision  -Potential unknown long term risks  Contraindications: You should not have Botox if you are pregnant, nursing, allergic to albumin, have an infection, skin condition, or muscle weakness at the site of the injection, or have myasthenia gravis, Lambert-Eaton syndrome, or ALS.  It is also possible that as with any injection, there may be an allergic reaction or no effect from the medication. Reduced effectiveness after repeated injections is sometimes seen and rarely infection at the injection site may occur. All care will be taken to prevent these side effects. If  therapy is given over a long time, atrophy and wasting in the muscle injected may occur. Occasionally the patient's become refractory to treatment because they develop antibodies to the toxin. In this event, therapy needs to be modified.  I have read the above information and consent to the administration of botulism toxin.    BOTOX PROCEDURE NOTE FOR MIGRAINE HEADACHE    Contraindications and precautions discussed with  patient(above). Aseptic procedure was observed and patient tolerated procedure. Procedure performed by Dr. Georgia Dom  The condition has existed for more than 6 months, and pt does not have a diagnosis of ALS, Myasthenia Gravis or Lambert-Eaton Syndrome.  Risks and benefits of injections discussed and pt agrees to proceed with the procedure.  Written consent obtained  These injections are medically necessary. Pt  receives good benefits from these injections. These injections do not cause sedations or hallucinations which the oral therapies may cause.  Description of procedure:  The patient was placed in a sitting position. The standard protocol was used for Botox as follows, with 5 units of Botox injected at each site:   -Procerus muscle, midline injection  -Corrugator muscle, bilateral injection  -Frontalis muscle, bilateral injection, with 2 sites each side, medial injection was performed in the upper one third of the frontalis muscle, in the region vertical from the medial inferior edge of the superior orbital rim. The lateral injection was again in the upper one third of the forehead vertically above the lateral limbus of the cornea, 1.5 cm lateral to the medial injection site.  -Temporalis muscle injection, 4 sites, bilaterally. The first injection was 3 cm above the tragus of the ear, second injection site was 1.5 cm to 3 cm up from the first injection site in line with the tragus of the ear. The third injection site was 1.5-3 cm forward between the first 2 injection sites. The fourth injection site was 1.5 cm posterior to the second injection site.  -Occipitalis muscle injection, 3 sites, bilaterally. The first injection was done one half way between the occipital protuberance and the tip of the mastoid process behind the ear. The second injection site was done lateral and superior to the first, 1 fingerbreadth from the first injection. The third injection site was 1 fingerbreadth  superiorly and medially from the first injection site.  -Cervical paraspinal muscle injection, 2 sites, bilateral knee first injection site was 1 cm from the midline of the cervical spine, 3 cm inferior to the lower border of the occipital protuberance. The second injection site was 1.5 cm superiorly and laterally to the first injection site.  -Trapezius muscle injection was performed at 3 sites, bilaterally. The first injection site was in the upper trapezius muscle halfway between the inflection point of the neck, and the acromion. The second injection site was one half way between the acromion and the first injection site. The third injection was done between the first injection site and the inflection point of the neck.   Will return for repeat injection in 3 months.   A 200 unit sof Botox was used, any Botox not injected was wasted. The patient tolerated the procedure well, there were no complications of the above procedure.

## 2018-11-13 NOTE — Progress Notes (Signed)
Botox- 100 units x 2 vials Lot: [id number] Expiration: 04/2021 & LOT: [id number] EXP: 04/2021 NDC: [id number]  Bacteriostatic 0.9% Sodium Chloride- 74mL total Lot: [id number] Expiration: 11/23/2018 NDC: [id number]  Dx: [id number] S/P

## 2018-11-14 ENCOUNTER — Telehealth: Payer: Self-pay | Admitting: *Deleted

## 2018-11-14 NOTE — Telephone Encounter (Signed)
-----   Message from Melvenia Beam, MD sent at 11/14/2018  9:07 AM EDT ----- Regarding: FW: 10/8 infusion Can you ask infusion if they can schedule patient for nfusion 10/8 she always gets a migraine after she has RFA and she is getting it 10/7 she can cancel if needed.  ----- Message ----- From: Melvenia Beam, MD Sent: 11/13/2018   8:53 PM EDT To: Melvenia Beam, MD Subject: 10/8 infusion                                  10/8 infusion lets see if we can schedule

## 2018-11-14 NOTE — Telephone Encounter (Signed)
Spoke with Lovena Le RN in [facility]. She will call the pt to schedule a migraine infusion for 10/8.

## 2018-11-14 NOTE — Telephone Encounter (Signed)
Per Joslyn Devon RN in [facility], pt scheduled for 1:00 pm on 11/30/2018 for migraine infusion. Per Dr. Jaynee Eagles, received orders for Depacon 1 gram IV x 1, Solumedrol 250 mg IV x1, Compazine 10 mg IV x 1, and Toradol 30 mg IV x 1. Orders pending signature. I have also printed a copy of pt's demographics/insurance/allergy/medication information for [facility].

## 2018-11-15 NOTE — Telephone Encounter (Signed)
Signed orders taken to [facility].

## 2018-11-21 DIAGNOSIS — M7911 Myalgia of mastication muscle: Secondary | ICD-10-CM | POA: Diagnosis not present

## 2018-11-21 DIAGNOSIS — M47812 Spondylosis without myelopathy or radiculopathy, cervical region: Secondary | ICD-10-CM | POA: Diagnosis not present

## 2018-11-27 DIAGNOSIS — D044 Carcinoma in situ of skin of scalp and neck: Secondary | ICD-10-CM | POA: Diagnosis not present

## 2018-11-29 NOTE — Telephone Encounter (Signed)
I spoke with the patient to discuss the infusion for tomorrow. She stated the RFA got rescheduled to 10/13 and she spoke with [facility] today and scheduled the migraine infusion for 12/06/2018 @ 3:00 pm.

## 2018-12-05 DIAGNOSIS — M47812 Spondylosis without myelopathy or radiculopathy, cervical region: Secondary | ICD-10-CM | POA: Diagnosis not present

## 2018-12-05 NOTE — Telephone Encounter (Signed)
Spoke with pt. She is planning to come for infusion tomorrow 10/14 @ 3 pm. She confirmed her name & DOB and stated no insurance, allergy or medication changes. I printed copies of demographics/insurance and allergy/med list and gave this to [facility].

## 2018-12-06 DIAGNOSIS — G43001 Migraine without aura, not intractable, with status migrainosus: Secondary | ICD-10-CM | POA: Diagnosis not present

## 2018-12-19 DIAGNOSIS — M950 Acquired deformity of nose: Secondary | ICD-10-CM | POA: Insufficient documentation

## 2018-12-19 DIAGNOSIS — J3489 Other specified disorders of nose and nasal sinuses: Secondary | ICD-10-CM | POA: Insufficient documentation

## 2018-12-21 ENCOUNTER — Other Ambulatory Visit: Payer: Self-pay | Admitting: Internal Medicine

## 2018-12-25 ENCOUNTER — Other Ambulatory Visit (HOSPITAL_COMMUNITY): Payer: Self-pay | Admitting: Interventional Radiology

## 2018-12-25 DIAGNOSIS — Z95828 Presence of other vascular implants and grafts: Secondary | ICD-10-CM

## 2018-12-26 ENCOUNTER — Other Ambulatory Visit: Payer: Self-pay

## 2018-12-26 DIAGNOSIS — Z20822 Contact with and (suspected) exposure to covid-19: Secondary | ICD-10-CM

## 2018-12-27 DIAGNOSIS — J309 Allergic rhinitis, unspecified: Secondary | ICD-10-CM | POA: Diagnosis not present

## 2018-12-27 LAB — NOVEL CORONAVIRUS, NAA: SARS-CoV-2, NAA: NOT DETECTED

## 2018-12-28 ENCOUNTER — Telehealth: Payer: Self-pay | Admitting: *Deleted

## 2018-12-28 NOTE — Telephone Encounter (Signed)
Received an order from Dr. Maudie Mercury for infusion suite to flush power port 1 x 1 month for next 12 months. Spoke with [facility]. They are unable to accept outside orders. Spoke with Dr. Jaynee Eagles who declined writing order as she does not manage pt's port. I called [facility] and left message for return call.

## 2018-12-29 ENCOUNTER — Ambulatory Visit (HOSPITAL_COMMUNITY): Admission: RE | Admit: 2018-12-29 | Payer: BC Managed Care – PPO | Source: Ambulatory Visit

## 2019-01-01 DIAGNOSIS — Z881 Allergy status to other antibiotic agents status: Secondary | ICD-10-CM | POA: Diagnosis not present

## 2019-01-01 DIAGNOSIS — Z888 Allergy status to other drugs, medicaments and biological substances status: Secondary | ICD-10-CM | POA: Diagnosis not present

## 2019-01-01 DIAGNOSIS — J3489 Other specified disorders of nose and nasal sinuses: Secondary | ICD-10-CM | POA: Diagnosis not present

## 2019-01-01 DIAGNOSIS — I10 Essential (primary) hypertension: Secondary | ICD-10-CM | POA: Diagnosis not present

## 2019-01-01 DIAGNOSIS — M950 Acquired deformity of nose: Secondary | ICD-10-CM | POA: Diagnosis not present

## 2019-01-01 DIAGNOSIS — G8929 Other chronic pain: Secondary | ICD-10-CM | POA: Diagnosis not present

## 2019-01-01 DIAGNOSIS — J449 Chronic obstructive pulmonary disease, unspecified: Secondary | ICD-10-CM | POA: Diagnosis not present

## 2019-01-01 DIAGNOSIS — Z883 Allergy status to other anti-infective agents status: Secondary | ICD-10-CM | POA: Diagnosis not present

## 2019-01-01 DIAGNOSIS — Z87891 Personal history of nicotine dependence: Secondary | ICD-10-CM | POA: Diagnosis not present

## 2019-01-01 DIAGNOSIS — E039 Hypothyroidism, unspecified: Secondary | ICD-10-CM | POA: Diagnosis not present

## 2019-01-08 ENCOUNTER — Encounter: Payer: Self-pay | Admitting: *Deleted

## 2019-01-08 NOTE — Telephone Encounter (Signed)
Spoke with Kari Green, Dr. Julianne Rice nurse and advised Dr. Jaynee Eagles declines ordering the power port flush per message below. She verbalized understanding and will let Dr. Maudie Mercury know. She also reported she had tried to call us on 11/9 (no record) and had not received the message that I had called her on 11/5.

## 2019-01-15 NOTE — Telephone Encounter (Signed)
Spoke with patient. She will come today by 3 pm for infusion. Pt stated she can have a driver. She confirmed no changes to allergies or meds since last reviewed 12/06/2018. Pt verbalized appreciation for the call.

## 2019-01-15 NOTE — Telephone Encounter (Signed)
Spoke with Dr. Leta Baptist. Ok to order the medications pt received in past (sent to him below). Orders written, pending MD signature. Leann from [facility] came by today and stated the pt didn't have a ride today so she has r/s her infusion for 2 pm tomorrow 11/24.

## 2019-01-16 DIAGNOSIS — G43001 Migraine without aura, not intractable, with status migrainosus: Secondary | ICD-10-CM | POA: Diagnosis not present

## 2019-01-21 DIAGNOSIS — Z03818 Encounter for observation for suspected exposure to other biological agents ruled out: Secondary | ICD-10-CM | POA: Diagnosis not present

## 2019-01-21 DIAGNOSIS — R05 Cough: Secondary | ICD-10-CM | POA: Diagnosis not present

## 2019-01-21 DIAGNOSIS — J029 Acute pharyngitis, unspecified: Secondary | ICD-10-CM | POA: Diagnosis not present

## 2019-01-31 ENCOUNTER — Telehealth: Payer: Self-pay | Admitting: Neurology

## 2019-01-31 NOTE — Telephone Encounter (Signed)
Pt was called and informed of her next time and date of her BOTOX inj.

## 2019-01-31 NOTE — Telephone Encounter (Signed)
I scheduled apt for first available. Please call and make her aware.

## 2019-03-05 ENCOUNTER — Ambulatory Visit (INDEPENDENT_AMBULATORY_CARE_PROVIDER_SITE_OTHER): Payer: BC Managed Care – PPO | Admitting: Neurology

## 2019-03-05 ENCOUNTER — Other Ambulatory Visit: Payer: Self-pay | Admitting: *Deleted

## 2019-03-05 ENCOUNTER — Other Ambulatory Visit: Payer: Self-pay

## 2019-03-05 VITALS — Temp 97.0°F

## 2019-03-05 DIAGNOSIS — G43711 Chronic migraine without aura, intractable, with status migrainosus: Secondary | ICD-10-CM

## 2019-03-05 MED ORDER — FREMANEZUMAB-VFRM 225 MG/1.5ML ~~LOC~~ SOAJ: 675.0000 mg | SUBCUTANEOUS | 1 refills | Status: DC

## 2019-03-05 NOTE — Progress Notes (Signed)
Botox- 100 units x 2 vials Lot: [id number] Expiration: 09/2021 NDC: [id number]  Bacteriostatic 0.9% Sodium Chloride- 52mL total Lot: [id number] Expiration: 05/24/2019 NDC: [id number]  Dx: [id number] S/P

## 2019-03-05 NOTE — Progress Notes (Signed)
Interval history 03/05/2019: Doing extremely well, will have migraines when the botox is wearing off.   Interval history 11/13/2018:  going on 10/7 for RFA and this may cause a severe migraine may need an infusion 10/8. Usually the next day she has a migraine. Otherwise doing great.   Interval history 07/25/2018: Aimovig in the past augmented her botox but could not have it approved, will try Ajovy. Prior to Botox her migraines were daily continuous. Since Starting Botox her migraines are 50% improved (from daily to 15 headaches days a month of those 8 being migrainous). She still has a burden of headaches even after >50% improvement with botox recommend Ajovy concurrently. She sees Dr. Jacelyn Grip for occipital and S1 RFA and goes to Dr. Harl Favor in [place] as he is the only one to do RFA on nerves of the knee Her migraines worsening since we are delayed on botox due to Covid19 injections will send to infusion today for migraine.  She vomits and can't take anything orally, try phenergan rectally No orders of the defined types were placed in this encounter.  Interval history 07/11/2017:  + masseters, + temples, + eyes, did not do traps used the extra on the forehead, lateral corrugators, end of brow  She is 75% better as far as frequency and severity of migraines.    Interval history 04/05/2017: She has shingles today and she is in pain in her right hip. She has had shinges multiple times. She has had the flu as well and she was in the bed sick and she had some headaches. Her baseline is daily migraines and since her botox (this is her third shot) she has only had one very bad migraine, tremendous improvement. She has a headache today but she is sick.  Consent Form Botulism Toxin Injection For Chronic Migraine    Reviewed orally with patient, additionally signature is on file:  Botulism toxin has been approved by the Federal drug administration for treatment of chronic migraine. Botulism toxin does not  cure chronic migraine and it may not be effective in some patients.  The administration of botulism toxin is accomplished by injecting a small amount of toxin into the muscles of the neck and head. Dosage must be titrated for each individual. Any benefits resulting from botulism toxin tend to wear off after 3 months with a repeat injection required if benefit is to be maintained. Injections are usually done every 3-4 months with maximum effect peak achieved by about 2 or 3 weeks. Botulism toxin is expensive and you should be sure of what costs you will incur resulting from the injection.  The side effects of botulism toxin use for chronic migraine may include:   -Transient, and usually mild, facial weakness with facial injections  -Transient, and usually mild, head or neck weakness with head/neck injections  -Reduction or loss of forehead facial animation due to forehead muscle weakness  -Eyelid drooping  -Dry eye  -Pain at the site of injection or bruising at the site of injection  -Double vision  -Potential unknown long term risks  Contraindications: You should not have Botox if you are pregnant, nursing, allergic to albumin, have an infection, skin condition, or muscle weakness at the site of the injection, or have myasthenia gravis, Lambert-Eaton syndrome, or ALS.  It is also possible that as with any injection, there may be an allergic reaction or no effect from the medication. Reduced effectiveness after repeated injections is sometimes seen and rarely infection at the injection site  may occur. All care will be taken to prevent these side effects. If therapy is given over a long time, atrophy and wasting in the muscle injected may occur. Occasionally the patient's become refractory to treatment because they develop antibodies to the toxin. In this event, therapy needs to be modified.  I have read the above information and consent to the administration of botulism toxin.    BOTOX  PROCEDURE NOTE FOR MIGRAINE HEADACHE    Contraindications and precautions discussed with patient(above). Aseptic procedure was observed and patient tolerated procedure. Procedure performed by Dr. Georgia Dom  The condition has existed for more than 6 months, and pt does not have a diagnosis of ALS, Myasthenia Gravis or Lambert-Eaton Syndrome.  Risks and benefits of injections discussed and pt agrees to proceed with the procedure.  Written consent obtained  These injections are medically necessary. Pt  receives good benefits from these injections. These injections do not cause sedations or hallucinations which the oral therapies may cause.  Description of procedure:  The patient was placed in a sitting position. The standard protocol was used for Botox as follows, with 5 units of Botox injected at each site:   -Procerus muscle, midline injection  -Corrugator muscle, bilateral injection  -Frontalis muscle, bilateral injection, with 2 sites each side, medial injection was performed in the upper one third of the frontalis muscle, in the region vertical from the medial inferior edge of the superior orbital rim. The lateral injection was again in the upper one third of the forehead vertically above the lateral limbus of the cornea, 1.5 cm lateral to the medial injection site.  -Temporalis muscle injection, 4 sites, bilaterally. The first injection was 3 cm above the tragus of the ear, second injection site was 1.5 cm to 3 cm up from the first injection site in line with the tragus of the ear. The third injection site was 1.5-3 cm forward between the first 2 injection sites. The fourth injection site was 1.5 cm posterior to the second injection site.   -Occipitalis muscle injection, 3 sites, bilaterally. The first injection was done one half way between the occipital protuberance and the tip of the mastoid process behind the ear. The second injection site was done lateral and superior to the first, 1  fingerbreadth from the first injection. The third injection site was 1 fingerbreadth superiorly and medially from the first injection site.  -Cervical paraspinal muscle injection, 2 sites, bilateral knee first injection site was 1 cm from the midline of the cervical spine, 3 cm inferior to the lower border of the occipital protuberance. The second injection site was 1.5 cm superiorly and laterally to the first injection site.  -Trapezius muscle injection was performed at 3 sites, bilaterally. The first injection site was in the upper trapezius muscle halfway between the inflection point of the neck, and the acromion. The second injection site was one half way between the acromion and the first injection site. The third injection was done between the first injection site and the inflection point of the neck.   Will return for repeat injection in 3 months.   200 units of Botox was used, any Botox not injected was wasted. The patient tolerated the procedure well, there were no complications of the above procedure.

## 2019-03-07 ENCOUNTER — Other Ambulatory Visit: Payer: Self-pay | Admitting: Neurology

## 2019-03-07 DIAGNOSIS — G43711 Chronic migraine without aura, intractable, with status migrainosus: Secondary | ICD-10-CM

## 2019-03-12 DIAGNOSIS — M25562 Pain in left knee: Secondary | ICD-10-CM | POA: Diagnosis not present

## 2019-03-12 DIAGNOSIS — R208 Other disturbances of skin sensation: Secondary | ICD-10-CM | POA: Diagnosis not present

## 2019-03-12 DIAGNOSIS — R609 Edema, unspecified: Secondary | ICD-10-CM | POA: Diagnosis not present

## 2019-03-12 DIAGNOSIS — M25561 Pain in right knee: Secondary | ICD-10-CM | POA: Diagnosis not present

## 2019-03-12 DIAGNOSIS — M25461 Effusion, right knee: Secondary | ICD-10-CM | POA: Diagnosis not present

## 2019-03-12 DIAGNOSIS — L539 Erythematous condition, unspecified: Secondary | ICD-10-CM | POA: Diagnosis not present

## 2019-03-12 DIAGNOSIS — M25462 Effusion, left knee: Secondary | ICD-10-CM | POA: Diagnosis not present

## 2019-03-20 ENCOUNTER — Other Ambulatory Visit: Payer: Self-pay | Admitting: Internal Medicine

## 2019-03-22 ENCOUNTER — Other Ambulatory Visit (HOSPITAL_COMMUNITY): Payer: Self-pay

## 2019-03-23 ENCOUNTER — Ambulatory Visit (HOSPITAL_COMMUNITY)
Admission: RE | Admit: 2019-03-23 | Discharge: 2019-03-23 | Disposition: A | Discharge status: Home / Self Care | Payer: BC Managed Care – PPO | Source: Ambulatory Visit | Attending: Internal Medicine | Admitting: Internal Medicine

## 2019-03-23 ENCOUNTER — Other Ambulatory Visit: Payer: Self-pay

## 2019-03-23 DIAGNOSIS — Z95828 Presence of other vascular implants and grafts: Secondary | ICD-10-CM | POA: Diagnosis not present

## 2019-03-23 LAB — COMPREHENSIVE METABOLIC PANEL
ALT: 12 U/L (ref 0–44)
AST: 20 U/L (ref 15–41)
Albumin: 4.2 g/dL (ref 3.5–5.0)
Alkaline Phosphatase: 61 U/L (ref 38–126)
Anion gap: 11 (ref 5–15)
BUN: 18 mg/dL (ref 6–20)
CO2: 27 mmol/L (ref 22–32)
Calcium: 9.4 mg/dL (ref 8.9–10.3)
Chloride: 101 mmol/L (ref 98–111)
Creatinine, Ser: 0.73 mg/dL (ref 0.44–1.00)
GFR calc Af Amer: >60 mL/min (ref >60)
GFR calc non Af Amer: >60 mL/min (ref >60)
Glucose, Bld: 91 mg/dL (ref 70–99)
Potassium: 3.9 mmol/L (ref 3.5–5.1)
Sodium: 139 mmol/L (ref 135–145)
Total Bilirubin: 0.7 mg/dL (ref 0.3–1.2)
Total Protein: 7.4 g/dL (ref 6.5–8.1)

## 2019-03-23 LAB — CBC
HCT: 40.5 % (ref 36.0–46.0)
Hemoglobin: 13.4 g/dL (ref 12.0–15.0)
MCH: 29.6 pg (ref 26.0–34.0)
MCHC: 33.1 g/dL (ref 30.0–36.0)
MCV: 89.6 fL (ref 80.0–100.0)
Platelets: 334 10*3/uL (ref 150–400)
RBC: 4.52 MIL/uL (ref 3.87–5.11)
RDW: 13.2 % (ref 11.5–15.5)
WBC: 8.1 10*3/uL (ref 4.0–10.5)
nRBC: 0 % (ref 0.0–0.2)

## 2019-03-23 LAB — TSH: TSH: 1.125 u[IU]/mL (ref 0.350–4.500)

## 2019-03-23 LAB — VITAMIN B12: Vitamin B-12: 660 pg/mL (ref 180–914)

## 2019-03-23 MED ORDER — HEPARIN SOD (PORK) LOCK FLUSH 100 UNIT/ML IV SOLN: 500.0000 [IU] | INTRAVENOUS | Status: DC | PRN

## 2019-03-23 MED ORDER — HEPARIN SOD (PORK) LOCK FLUSH 100 UNIT/ML IV SOLN
INTRAVENOUS | Status: AC
  Administered 2019-03-23: 500 [IU]
  Filled 2019-03-23: qty 5

## 2019-03-23 MED ORDER — HEPARIN SOD (PORK) LOCK FLUSH 100 UNIT/ML IV SOLN
INTRAVENOUS | Status: AC
  Filled 2019-03-23: qty 5

## 2019-03-23 MED ORDER — HEPARIN SOD (PORK) LOCK FLUSH 100 UNIT/ML IV SOLN: 500.0000 [IU] | INTRAVENOUS | Status: DC

## 2019-03-26 NOTE — Progress Notes (Signed)
Called Dr Julianne Rice office and left a message requesting monthly orders to flush PAC, also pt requested her cholesterol be checked so we need an order for that  as well as any labs they want for next month.

## 2019-04-19 ENCOUNTER — Inpatient Hospital Stay (HOSPITAL_COMMUNITY): Admission: RE | Admit: 2019-04-19 | Payer: BC Managed Care – PPO | Source: Ambulatory Visit

## 2019-04-26 ENCOUNTER — Other Ambulatory Visit: Payer: Self-pay | Admitting: Neurology

## 2019-04-26 DIAGNOSIS — G43711 Chronic migraine without aura, intractable, with status migrainosus: Secondary | ICD-10-CM

## 2019-04-30 ENCOUNTER — Inpatient Hospital Stay (HOSPITAL_COMMUNITY): Admission: RE | Admit: 2019-04-30 | Payer: BC Managed Care – PPO | Source: Ambulatory Visit

## 2019-04-30 DIAGNOSIS — F325 Major depressive disorder, single episode, in full remission: Secondary | ICD-10-CM | POA: Diagnosis not present

## 2019-04-30 DIAGNOSIS — J449 Chronic obstructive pulmonary disease, unspecified: Secondary | ICD-10-CM | POA: Diagnosis not present

## 2019-04-30 DIAGNOSIS — E785 Hyperlipidemia, unspecified: Secondary | ICD-10-CM | POA: Diagnosis not present

## 2019-04-30 DIAGNOSIS — K861 Other chronic pancreatitis: Secondary | ICD-10-CM | POA: Diagnosis not present

## 2019-04-30 DIAGNOSIS — G43001 Migraine without aura, not intractable, with status migrainosus: Secondary | ICD-10-CM | POA: Diagnosis not present

## 2019-04-30 NOTE — Telephone Encounter (Signed)
Ok for migraine infusion per Dr. Jaynee Eagles. Lovena Le RN in infusion can see pt today. Patient confirmed name & DOB. She stated no changes to allergies or medications since last updated 03/23/19. Pt stated she can come at 1:45 pm. She was going to have port flushed today at 1 pm but given we will access her port here she is going to cancel that flush appt. Pt aware if she needed labs she will need to have a separate appt for that. Pt stated she has an appt at 3 pm that she cannot miss. She verbalized appreciation for the call.

## 2019-04-30 NOTE — Telephone Encounter (Signed)
Orders reviewed and signed by Dr. Jaynee Eagles. Depacon 1 gram IV x 1, Toradol 30 mg IV x 1, Solumedrol 250 mg IV x 1. Orders given to [employer] in [facility].

## 2019-05-04 ENCOUNTER — Other Ambulatory Visit: Payer: Self-pay | Admitting: Cardiology

## 2019-05-04 DIAGNOSIS — R002 Palpitations: Secondary | ICD-10-CM

## 2019-05-04 NOTE — Progress Notes (Signed)
Recurrent palpitations. Will place monitor. F/u after that.

## 2019-05-14 ENCOUNTER — Telehealth: Payer: Self-pay | Admitting: Cardiology

## 2019-05-14 DIAGNOSIS — Z79899 Other long term (current) drug therapy: Secondary | ICD-10-CM | POA: Diagnosis not present

## 2019-05-14 DIAGNOSIS — K449 Diaphragmatic hernia without obstruction or gangrene: Secondary | ICD-10-CM | POA: Diagnosis not present

## 2019-05-14 DIAGNOSIS — R109 Unspecified abdominal pain: Secondary | ICD-10-CM | POA: Diagnosis not present

## 2019-05-14 DIAGNOSIS — I10 Essential (primary) hypertension: Secondary | ICD-10-CM | POA: Diagnosis not present

## 2019-05-14 DIAGNOSIS — R1013 Epigastric pain: Secondary | ICD-10-CM | POA: Diagnosis not present

## 2019-05-14 DIAGNOSIS — K580 Irritable bowel syndrome with diarrhea: Secondary | ICD-10-CM | POA: Diagnosis not present

## 2019-05-15 ENCOUNTER — Ambulatory Visit: Payer: Self-pay | Admitting: Cardiology

## 2019-05-17 ENCOUNTER — Encounter (HOSPITAL_COMMUNITY): Payer: BC Managed Care – PPO

## 2019-05-29 ENCOUNTER — Telehealth: Payer: Self-pay | Admitting: *Deleted

## 2019-05-29 DIAGNOSIS — G43711 Chronic migraine without aura, intractable, with status migrainosus: Secondary | ICD-10-CM

## 2019-05-29 NOTE — Telephone Encounter (Signed)
Predetermination form filled out and faxed with clinical notes to [phone]

## 2019-05-29 NOTE — Telephone Encounter (Signed)
I called [employer] [phone] to verify eligibility.  Per the automated system the insurance is valid.  Ref# for automated answer is [id number].  I called [phone] and spoke to [place]  She states that [id number] and [phone] do not require PA but could not verify if they are billable.  She transferred me to [phone] to verify benefits.  Ref# for call is [id number].  I called [phone] and spoke to [facility] who states that she cant say if the codes are billable but she can check on exclusions.  States there are no exclusions. [id number] will require a Pre-Determination. And B/B.  To to website and download form to be filled out and faxed in.   Ref# for this call is [phone]

## 2019-05-31 ENCOUNTER — Inpatient Hospital Stay (HOSPITAL_COMMUNITY): Admission: RE | Admit: 2019-05-31 | Payer: BC Managed Care – PPO | Source: Ambulatory Visit

## 2019-05-31 NOTE — Telephone Encounter (Signed)
I called [phone] and spoke to Judson Roch.  She states the Pre-D is still pending.  Ref# for call is [facility]

## 2019-06-04 NOTE — Telephone Encounter (Signed)
I called [phone] again today to check the status of the Pre-D Ref# [id number].  I spoke to [place].  She states she sees the Pre-D in the system.  Looks like they were needing the duration of the Botox treatments we are requesting.  I told her 365 days. She added that to the Pre-D and sent a message to the escalation team since patient is scheduled for tomorrow.  She also transferred me to the escalation team to leave an additional message.  I called patient to make her aware.  I will call her as soon as possible with the outcome.

## 2019-06-04 NOTE — Telephone Encounter (Signed)
I Called again to the PA line [phone] and spoke to [place].  He states it was denied.  States it says it is not medically necessary.  Ref# for call is [id number].  I called patient to make her aware.  She did not answer.  I left a voicemail with this information and  I canceled the appointment.  Told her I would make Dr. Jaynee Eagles aware and would call her with where we go from here.  Please advise.

## 2019-06-05 ENCOUNTER — Ambulatory Visit: Payer: BC Managed Care – PPO | Admitting: Neurology

## 2019-06-05 NOTE — Telephone Encounter (Signed)
Predetermination request resent with additional clinical notes to [place]

## 2019-06-05 NOTE — Telephone Encounter (Signed)
Pt sent [place] a [employer]. She is going to call her insurance company today and will let us know what they say.

## 2019-06-05 NOTE — Telephone Encounter (Signed)
I called the pt back. She stated she called insurance this morning and was told that Botox was denied because they did not receive enough information. I let her know I would inform the botox coordinator and we would send in more notes. Pt verbalized appreciation.

## 2019-06-07 DIAGNOSIS — Z79891 Long term (current) use of opiate analgesic: Secondary | ICD-10-CM | POA: Diagnosis not present

## 2019-06-08 ENCOUNTER — Inpatient Hospital Stay (HOSPITAL_COMMUNITY): Admission: RE | Admit: 2019-06-08 | Payer: BC Managed Care – PPO | Source: Ambulatory Visit

## 2019-06-12 ENCOUNTER — Telehealth: Payer: Self-pay | Admitting: *Deleted

## 2019-06-12 ENCOUNTER — Inpatient Hospital Stay (HOSPITAL_COMMUNITY): Admission: RE | Admit: 2019-06-12 | Payer: BC Managed Care – PPO | Source: Ambulatory Visit

## 2019-06-12 NOTE — Telephone Encounter (Signed)
I spoke to patient who states that when she spoke to her insurance company they told her that if we send additional clinical notes they would approve her Botox visit within 3 days.  I told her we sent the additional clinical notes on 06/05/2019 but have not heard from them with an approval.  Patient states she has had a migraine for 3 days.  I offered to send a message to the doctor to see if she might suggest something to help but she refused.  States she has been taking the meds prescribed.  I told her I will call the insurance company back and return her call.  I called [employer] [phone] to check the status of the Predetermination.  I spoke to [place]  who states the did receive the additional clinical notes but looks like it is still in process.  She transferred me to the nurse line. After 27 minutes the call was disconnected.  I called again and spoke to [place] who states she does see that they received the additional clinical notes but it was still denied.  She states the next step would be a pier to [employer].  The number to call to initiate that is [phone].  I called patient back to let her know.  Told her I will forward message to the RN and Dr. Jaynee Eagles. Please advise

## 2019-06-12 NOTE — Telephone Encounter (Signed)
Pt is asking for a call from [place] re: her getting Botox as soon as possible, pt states she is 3 days into a migraine.  Please call

## 2019-06-12 NOTE — Telephone Encounter (Signed)
Per botox phone note: The number to call to initiate a peer to peer is [phone].

## 2019-06-12 NOTE — Telephone Encounter (Signed)
Patient called to check the status of the Predetermination with her Botox.  After sending additional clinical notes as requested the still denied Botox.  The next step would be a pier to [employer].  Please advise.  Patient is very confused since she was just here in January and states it was approved.  Would like a call back tomorrow please. Please see Botox phone note.

## 2019-06-13 ENCOUNTER — Encounter: Payer: Self-pay | Admitting: Adult Health

## 2019-06-13 ENCOUNTER — Telehealth (INDEPENDENT_AMBULATORY_CARE_PROVIDER_SITE_OTHER): Payer: BC Managed Care – PPO | Admitting: Adult Health

## 2019-06-13 DIAGNOSIS — G43711 Chronic migraine without aura, intractable, with status migrainosus: Secondary | ICD-10-CM | POA: Diagnosis not present
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
## 2019-06-13 NOTE — Telephone Encounter (Signed)
No lets do what we discussed

## 2019-06-13 NOTE — Telephone Encounter (Signed)
I am offering pt an appt via mychart.

## 2019-06-15 ENCOUNTER — Encounter (HOSPITAL_COMMUNITY): Payer: BC Managed Care – PPO

## 2019-06-18 ENCOUNTER — Telehealth: Payer: Self-pay | Admitting: *Deleted

## 2019-06-18 NOTE — Telephone Encounter (Signed)
Patient had a video visit with Janett Billow on 06/13/2019.  Per her request we will try to get PA for Botox.  Predetermination form filled out and faxed to [place] with clinical notes attached.

## 2019-06-20 ENCOUNTER — Inpatient Hospital Stay (HOSPITAL_COMMUNITY): Admission: RE | Admit: 2019-06-20 | Payer: BC Managed Care – PPO | Source: Ambulatory Visit

## 2019-06-21 NOTE — Telephone Encounter (Signed)
I returned Kari Green's call regarding the appeal for Botox.  There was no answer but I did leave a voicemail requesting a call back. I also had a message from [name] with appeals [phone].  I returned her call and got no answer.  Unable to leave a message her voicemail was full.

## 2019-06-21 NOTE — Telephone Encounter (Signed)
Phone rep checked office voicemail's, @11 :[place] is asking for a call re: an appeal for Botox for pt. Colletta [place] can be reached at [phone]

## 2019-07-02 DIAGNOSIS — M79675 Pain in left toe(s): Secondary | ICD-10-CM | POA: Diagnosis not present

## 2019-07-03 MED ORDER — BOTOX 100 UNITS IJ SOLR: INTRAMUSCULAR | 1 refills | Status: DC

## 2019-07-03 NOTE — Telephone Encounter (Signed)
Please send prescription to [facility].  I called patient to find out what specialty pharmacy we need to send the prescription to.  She states we should have the medication here from her previous appointment that was canceled due to the PA being denied.  I told her it was our understanding that [facility] was no longer filling Botox after the end of February so I am a bit confused since her previous appointment was early April.  She states she already paid them $100 which she states she never had to pay before she started coming to our office for her treatments anyway.  I called [facility] [phone].  I spoke to [place].  She states that the last shipment that was billed to patients insurance was Jan 5. And confirmed that they do not fill Botox prescriptions anymore.  I called the Rx number on the patients insurance card and spoke to [place].  She suggested [facility].  I tried to call patient and got no answer and no voicemail picked up.

## 2019-07-03 NOTE — Addendum Note (Signed)
Addended by: Gildardo Griffes on: 07/03/2019 12:08 PM   Modules accepted: Orders

## 2019-07-03 NOTE — Telephone Encounter (Signed)
Predetermination approval for Botox 200 units every 3 months (4sessions) for one year Effective date 11/23/2018 received in the mail on 06/28/2019. I called [employer] [phone] to find out what SP we can use with her plan since [facility] no longer fills Botox. The prompts would state speaking to a customer service representative was not an option.  I called the precert line [id number] and spoke to [place].  They were not able to answer the question but suggested I call back at 8:00AM their time and speak to a service rep.

## 2019-07-03 NOTE — Telephone Encounter (Signed)
Pt asking for a call re: scheduling her next Botox

## 2019-07-04 NOTE — Telephone Encounter (Signed)
I called patient to offer May 25 at 4:30PM.  No answer and no voicemail.  I sent a mychart message that we will go ahead and add her to the schedule that day but to call if that date and time will not work for her.

## 2019-07-04 NOTE — Telephone Encounter (Signed)
I sent a message to Hinton Dyer so she can reach out to the patient and schedule.

## 2019-07-04 NOTE — Telephone Encounter (Signed)
She can be scheduled with Dr Jaynee Eagles any day  after Jul 17, 2019. I called [facility] [phone] again and spoke to [place].  Delivery was scheduled for Tuesday Jul 17, 2019.

## 2019-07-04 NOTE — Telephone Encounter (Signed)
I called [facility] [phone] and spoke to [place] to check status of prescription and schedule delivery.  He states that the prescription is there but they do not have consent. States patient must call  to go over benefits and give consent to deliver.  I called patient to ask her to call them.  She verbalized understanding. I told her I will call [facility] back in a few hours to schedule delivery and I will let Romelle Starcher know when we will be able to get it here in order to schedule her appointment.

## 2019-07-06 ENCOUNTER — Encounter (HOSPITAL_COMMUNITY): Payer: BC Managed Care – PPO

## 2019-07-10 ENCOUNTER — Encounter (HOSPITAL_COMMUNITY): Payer: BC Managed Care – PPO

## 2019-07-16 ENCOUNTER — Other Ambulatory Visit (HOSPITAL_COMMUNITY): Payer: Self-pay

## 2019-07-16 NOTE — Progress Notes (Signed)
07/17/2019: Still doing well, no migraines 100% improvement. However the migraines start coming back if we are late for botox an we have been this time.  Phenergan not effective she has taken compazine before, will also send toradol to home, advised to ask if the compazine has red or yellow dye(allergies) but again she has tolerates in the past without problems. The back of her head is fine, she has more pain in the forehead, eyes, top of head also clenching is a big issue she does not have too much trouble with cervical muscles. We will send her toradol injections acutely she states nothing else works but infusions, nurtec didn't help, she can't remember Roselyn Meier may try that next.  Interval history 03/05/2019: Doing extremely well, will have migraines when the botox is wearing off.   Interval history 11/13/2018:  going on 10/7 for RFA and this may cause a severe migraine may need an infusion 10/8. Usually the next day she has a migraine. Otherwise doing great.   Interval history 07/25/2018: Aimovig in the past augmented her botox but could not have it approved, will try Ajovy. Prior to Botox her migraines were daily continuous. Since Starting Botox her migraines are 50% improved (from daily to 15 headaches days a month of those 8 being migrainous). She still has a burden of headaches even after >50% improvement with botox recommend Ajovy concurrently. She sees Dr. Jacelyn Grip for occipital and S1 RFA and goes to Dr. Harl Favor in [place] as he is the only one to do RFA on nerves of the knee Her migraines worsening since we are delayed on botox due to Covid19 injections will send to infusion today for migraine.  She vomits and can't take anything orally, try phenergan rectally No orders of the defined types were placed in this encounter.  Interval history 07/11/2017:  + masseters, + temples, + eyes, did not do traps used the extra on the forehead, lateral corrugators, end of brow  She is 75% better as far as  frequency and severity of migraines.    Interval history 04/05/2017: She has shingles today and she is in pain in her right hip. She has had shinges multiple times. She has had the flu as well and she was in the bed sick and she had some headaches. Her baseline is daily migraines and since her botox (this is her third shot) she has only had one very bad migraine, tremendous improvement. She has a headache today but she is sick.  Consent Form Botulism Toxin Injection For Chronic Migraine    Reviewed orally with patient, additionally signature is on file:  Botulism toxin has been approved by the Federal drug administration for treatment of chronic migraine. Botulism toxin does not cure chronic migraine and it may not be effective in some patients.  The administration of botulism toxin is accomplished by injecting a small amount of toxin into the muscles of the neck and head. Dosage must be titrated for each individual. Any benefits resulting from botulism toxin tend to wear off after 3 months with a repeat injection required if benefit is to be maintained. Injections are usually done every 3-4 months with maximum effect peak achieved by about 2 or 3 weeks. Botulism toxin is expensive and you should be sure of what costs you will incur resulting from the injection.  The side effects of botulism toxin use for chronic migraine may include:   -Transient, and usually mild, facial weakness with facial injections  -Transient, and usually mild, head  or neck weakness with head/neck injections  -Reduction or loss of forehead facial animation due to forehead muscle weakness  -Eyelid drooping  -Dry eye  -Pain at the site of injection or bruising at the site of injection  -Double vision  -Potential unknown long term risks  Contraindications: You should not have Botox if you are pregnant, nursing, allergic to albumin, have an infection, skin condition, or muscle weakness at the site of the injection, or  have myasthenia gravis, Lambert-Eaton syndrome, or ALS.  It is also possible that as with any injection, there may be an allergic reaction or no effect from the medication. Reduced effectiveness after repeated injections is sometimes seen and rarely infection at the injection site may occur. All care will be taken to prevent these side effects. If therapy is given over a long time, atrophy and wasting in the muscle injected may occur. Occasionally the patient's become refractory to treatment because they develop antibodies to the toxin. In this event, therapy needs to be modified.  I have read the above information and consent to the administration of botulism toxin.    BOTOX PROCEDURE NOTE FOR MIGRAINE HEADACHE    Contraindications and precautions discussed with patient(above). Aseptic procedure was observed and patient tolerated procedure. Procedure performed by Dr. Georgia Dom  The condition has existed for more than 6 months, and pt does not have a diagnosis of ALS, Myasthenia Gravis or Lambert-Eaton Syndrome.  Risks and benefits of injections discussed and pt agrees to proceed with the procedure.  Written consent obtained  These injections are medically necessary. Pt  receives good benefits from these injections. These injections do not cause sedations or hallucinations which the oral therapies may cause.  Description of procedure:  The patient was placed in a sitting position. The standard protocol was used for Botox as follows, with 5 units of Botox injected at each site:   -Procerus muscle, midline injection  -Corrugator muscle, bilateral injection  -Frontalis muscle, bilateral injection, with 2 sites each side, medial injection was performed in the upper one third of the frontalis muscle, in the region vertical from the medial inferior edge of the superior orbital rim. The lateral injection was again in the upper one third of the forehead vertically above the lateral limbus of the  cornea, 1.5 cm lateral to the medial injection site.  -Temporalis muscle injection, 4 sites, bilaterally. The first injection was 3 cm above the tragus of the ear, second injection site was 1.5 cm to 3 cm up from the first injection site in line with the tragus of the ear. The third injection site was 1.5-3 cm forward between the first 2 injection sites. The fourth injection site was 1.5 cm posterior to the second injection site.   -Occipitalis muscle injection, 3 sites, bilaterally. The first injection was done one half way between the occipital protuberance and the tip of the mastoid process behind the ear. The second injection site was done lateral and superior to the first, 1 fingerbreadth from the first injection. The third injection site was 1 fingerbreadth superiorly and medially from the first injection site.  -Cervical paraspinal muscle injection, 2 sites, bilateral knee first injection site was 1 cm from the midline of the cervical spine, 3 cm inferior to the lower border of the occipital protuberance. The second injection site was 1.5 cm superiorly and laterally to the first injection site.  -Trapezius muscle injection was performed at 3 sites, bilaterally. The first injection site was in the upper trapezius  muscle halfway between the inflection point of the neck, and the acromion. The second injection site was one half way between the acromion and the first injection site. The third injection was done between the first injection site and the inflection point of the neck.   Will return for repeat injection in 3 months.   200 units of Botox was used, any Botox not injected was wasted. The patient tolerated the procedure well, there were no complications of the above procedure.

## 2019-07-17 ENCOUNTER — Ambulatory Visit (INDEPENDENT_AMBULATORY_CARE_PROVIDER_SITE_OTHER): Payer: Medicare Other | Admitting: Neurology

## 2019-07-17 ENCOUNTER — Ambulatory Visit (HOSPITAL_COMMUNITY)
Admission: RE | Admit: 2019-07-17 | Discharge: 2019-07-17 | Disposition: A | Discharge status: Home / Self Care | Payer: Medicare Other | Source: Ambulatory Visit | Attending: Internal Medicine | Admitting: Internal Medicine

## 2019-07-17 ENCOUNTER — Telehealth: Payer: Self-pay | Admitting: Neurology

## 2019-07-17 ENCOUNTER — Other Ambulatory Visit: Payer: Self-pay

## 2019-07-17 ENCOUNTER — Telehealth: Payer: Self-pay

## 2019-07-17 DIAGNOSIS — G43711 Chronic migraine without aura, intractable, with status migrainosus: Secondary | ICD-10-CM | POA: Diagnosis not present

## 2019-07-17 MED ORDER — KETOROLAC TROMETHAMINE 60 MG/2ML IM SOLN: INTRAMUSCULAR | 2 refills | Status: DC

## 2019-07-17 MED ORDER — HEPARIN SOD (PORK) LOCK FLUSH 100 UNIT/ML IV SOLN
INTRAVENOUS | Status: AC
  Filled 2019-07-17: qty 5

## 2019-07-17 MED ORDER — HEPARIN SOD (PORK) LOCK FLUSH 100 UNIT/ML IV SOLN: 500.0000 [IU] | INTRAVENOUS | Status: DC | PRN

## 2019-07-17 MED ORDER — PROCHLORPERAZINE MALEATE 10 MG PO TABS: 10.0000 mg | ORAL_TABLET | Freq: Four times a day (QID) | ORAL | 0 refills | Status: DC | PRN

## 2019-07-17 NOTE — Telephone Encounter (Signed)
Pt called to inform her pharmacy has changed to [facility] on [address] states if prescription for the Toradol is sent she will also need a prescription for the needles

## 2019-07-17 NOTE — Telephone Encounter (Signed)
I tried to do PA on cover my meds. The message from cover my meds was Additional Information Required  Your PA has been resolved, no additional PA is required. For further inquiries please contact the number on the back of the member prescription card. (Message [phone])

## 2019-07-17 NOTE — Progress Notes (Signed)
Pt here for PAC flush.  Port accessed and flushed without difficulty but unable to get blood return.  Pt to return tomorrow for IV team to flush.  She had another appt this afternoon so she was unable to stay

## 2019-07-17 NOTE — Telephone Encounter (Signed)
Pharmacy changed. Per Dr. Jaynee Eagles prescribe Toradol 60 mg q6h prn (max 2 doses per day) #16 mL.

## 2019-07-17 NOTE — Progress Notes (Signed)
Botox- 100 units x 2 vials Lot: [id number] Expiration: 11/2021 NDC: 0023-1145-01  Bacteriostatic 0.9% Sodium Chloride- 4mL total Lot: [id number] Expiration: 08/23/2019 NDC: 0409-1966-02  Dx: G43.711 S/P  

## 2019-07-17 NOTE — Telephone Encounter (Signed)
Medication delivered (2) 100 units Botox 5/25 for 5/25 appt

## 2019-07-18 ENCOUNTER — Encounter (HOSPITAL_COMMUNITY)
Admission: RE | Admit: 2019-07-18 | Discharge: 2019-07-18 | Disposition: A | Discharge status: Home / Self Care | Payer: BC Managed Care – PPO | Source: Ambulatory Visit | Attending: Internal Medicine | Admitting: Internal Medicine

## 2019-07-18 ENCOUNTER — Ambulatory Visit (HOSPITAL_COMMUNITY)
Admission: RE | Admit: 2019-07-18 | Discharge: 2019-07-18 | Disposition: A | Discharge status: Home / Self Care | Payer: BC Managed Care – PPO | Source: Ambulatory Visit | Attending: Internal Medicine | Admitting: Internal Medicine

## 2019-07-18 DIAGNOSIS — Z95828 Presence of other vascular implants and grafts: Secondary | ICD-10-CM | POA: Insufficient documentation

## 2019-07-18 DIAGNOSIS — Z452 Encounter for adjustment and management of vascular access device: Secondary | ICD-10-CM | POA: Diagnosis not present

## 2019-07-18 MED ORDER — ALTEPLASE 2 MG IJ SOLR: 2.0000 mg | Freq: Once | INTRAMUSCULAR | Status: DC

## 2019-07-18 MED ORDER — ALTEPLASE 2 MG IJ SOLR
INTRAMUSCULAR | Status: AC
  Administered 2019-07-18: 2 mg
  Filled 2019-07-18: qty 2

## 2019-07-18 MED ORDER — HEPARIN SOD (PORK) LOCK FLUSH 100 UNIT/ML IV SOLN
500.0000 [IU] | INTRAVENOUS | Status: DC
  Administered 2019-07-18: 500 [IU]

## 2019-08-06 NOTE — Telephone Encounter (Signed)
[facility] to see if Compazine can be transferred. I was told the 07/17/19 prescription was on hold and the receiving pharmacy would have to reach out and request transfer. I called [facility] on [address] and discussed with Shirlee Limerick. She stated she would call and request transfer of the Comp[facility] and that it may be about an hour before she can call. I updated the pt.

## 2019-08-13 ENCOUNTER — Other Ambulatory Visit: Payer: Self-pay | Admitting: Neurology

## 2019-08-13 DIAGNOSIS — M25562 Pain in left knee: Secondary | ICD-10-CM | POA: Diagnosis not present

## 2019-08-13 DIAGNOSIS — M25552 Pain in left hip: Secondary | ICD-10-CM | POA: Diagnosis not present

## 2019-08-13 DIAGNOSIS — M7062 Trochanteric bursitis, left hip: Secondary | ICD-10-CM | POA: Diagnosis not present

## 2019-08-14 ENCOUNTER — Other Ambulatory Visit (HOSPITAL_COMMUNITY): Payer: Self-pay | Admitting: *Deleted

## 2019-08-15 ENCOUNTER — Encounter (HOSPITAL_COMMUNITY): Payer: Self-pay

## 2019-08-15 ENCOUNTER — Encounter (HOSPITAL_COMMUNITY): Payer: BC Managed Care – PPO

## 2019-08-18 ENCOUNTER — Other Ambulatory Visit: Payer: Self-pay | Admitting: Neurology

## 2019-08-20 DIAGNOSIS — R21 Rash and other nonspecific skin eruption: Secondary | ICD-10-CM | POA: Diagnosis not present

## 2019-08-20 DIAGNOSIS — M25552 Pain in left hip: Secondary | ICD-10-CM | POA: Diagnosis not present

## 2019-08-23 ENCOUNTER — Other Ambulatory Visit (HOSPITAL_COMMUNITY): Payer: Self-pay | Admitting: *Deleted

## 2019-08-24 ENCOUNTER — Ambulatory Visit (HOSPITAL_COMMUNITY)
Admission: RE | Admit: 2019-08-24 | Discharge: 2019-08-24 | Disposition: A | Discharge status: Home / Self Care | Payer: BC Managed Care – PPO | Source: Ambulatory Visit | Attending: Internal Medicine | Admitting: Internal Medicine

## 2019-08-24 ENCOUNTER — Other Ambulatory Visit: Payer: Self-pay

## 2019-08-24 DIAGNOSIS — Z452 Encounter for adjustment and management of vascular access device: Secondary | ICD-10-CM | POA: Diagnosis not present

## 2019-08-24 MED ORDER — HEPARIN SOD (PORK) LOCK FLUSH 100 UNIT/ML IV SOLN: 500.0000 [IU] | INTRAVENOUS | Status: AC | PRN

## 2019-08-24 MED ORDER — HEPARIN SOD (PORK) LOCK FLUSH 100 UNIT/ML IV SOLN
INTRAVENOUS | Status: AC
  Administered 2019-08-24: 500 [IU]
  Filled 2019-08-24: qty 5

## 2019-08-28 DIAGNOSIS — M7062 Trochanteric bursitis, left hip: Secondary | ICD-10-CM | POA: Diagnosis not present

## 2019-08-28 DIAGNOSIS — M25552 Pain in left hip: Secondary | ICD-10-CM | POA: Diagnosis not present

## 2019-08-30 ENCOUNTER — Telehealth: Payer: Self-pay | Admitting: Adult Health

## 2019-08-30 NOTE — Telephone Encounter (Signed)
Pt is requesting a refill for ketorolac (TORADOL) 60 MG/2ML SOLN injection and syringes.  Pharmacy: [place] [phone]

## 2019-08-30 NOTE — Telephone Encounter (Signed)
I called and spoke to pharmacy and she has 2 refills available.  They will process for her.

## 2019-09-25 DIAGNOSIS — J3489 Other specified disorders of nose and nasal sinuses: Secondary | ICD-10-CM | POA: Diagnosis not present

## 2019-09-26 ENCOUNTER — Encounter (HOSPITAL_COMMUNITY): Admission: RE | Admit: 2019-09-26 | Payer: BC Managed Care – PPO | Source: Ambulatory Visit

## 2019-10-11 ENCOUNTER — Telehealth: Payer: Self-pay | Admitting: Neurology

## 2019-10-11 NOTE — Telephone Encounter (Signed)
Patient has a Botox appointment on 8/31. She has a PA on file with [place]. PA was effective on 11/23/18 and is good for one year (11/23/19). PA on file with [facility]. PA #[id number] (10/04/19- 10/03/20). I called [facility] to schedule Botox delivery. Botox TBD 8/26.

## 2019-10-18 NOTE — Telephone Encounter (Signed)
Botox delivered today from [place] for patient's 8/31 appointment. (2) 100U vials.

## 2019-10-22 NOTE — Progress Notes (Signed)
10/23/2019: Still doing great, no migraines until botox is due the 1-2 weeks prior. We sent toradol to her home injections. Clenching is a big issue.    07/17/2019: Still doing well, no migraines 100% improvement. However the migraines start coming back if we are late for botox an we have been this time.  Phenergan not effective she has taken compazine before, will also send toradol to home, advised to ask if the compazine has red or yellow dye(allergies) but again she has tolerates in the past without problems. The back of her head is fine, she has more pain in the forehead, eyes, top of head also clenching is a big issue she does not have too much trouble with cervical muscles. We will send her toradol injections acutely she states nothing else works but infusions, nurtec didn't help, she can't remember Roselyn Meier may try that next.  Interval history 03/05/2019: Doing extremely well, will have migraines when the botox is wearing off.   Interval history 11/13/2018:  going on 10/7 for RFA and this may cause a severe migraine may need an infusion 10/8. Usually the next day she has a migraine. Otherwise doing great.   Interval history 07/25/2018: Aimovig in the past augmented her botox but could not have it approved, will try Ajovy. Prior to Botox her migraines were daily continuous. Since Starting Botox her migraines are 50% improved (from daily to 15 headaches days a month of those 8 being migrainous). She still has a burden of headaches even after >50% improvement with botox recommend Ajovy concurrently. She sees Dr. Jacelyn Grip for occipital and S1 RFA and goes to Dr. Harl Favor in [place] as he is the only one to do RFA on nerves of the knee Her migraines worsening since we are delayed on botox due to Covid19 injections will send to infusion today for migraine.  She vomits and can't take anything orally, try phenergan rectally No orders of the defined types were placed in this encounter.  Interval history  07/11/2017:  + masseters, + temples, + eyes, did not do traps used the extra on the forehead, lateral corrugators, end of brow  She is 75% better as far as frequency and severity of migraines.    Interval history 04/05/2017: She has shingles today and she is in pain in her right hip. She has had shinges multiple times. She has had the flu as well and she was in the bed sick and she had some headaches. Her baseline is daily migraines and since her botox (this is her third shot) she has only had one very bad migraine, tremendous improvement. She has a headache today but she is sick.  Consent Form Botulism Toxin Injection For Chronic Migraine    Reviewed orally with patient, additionally signature is on file:  Botulism toxin has been approved by the Federal drug administration for treatment of chronic migraine. Botulism toxin does not cure chronic migraine and it may not be effective in some patients.  The administration of botulism toxin is accomplished by injecting a small amount of toxin into the muscles of the neck and head. Dosage must be titrated for each individual. Any benefits resulting from botulism toxin tend to wear off after 3 months with a repeat injection required if benefit is to be maintained. Injections are usually done every 3-4 months with maximum effect peak achieved by about 2 or 3 weeks. Botulism toxin is expensive and you should be sure of what costs you will incur resulting from the injection.  The side effects of botulism toxin use for chronic migraine may include:   -Transient, and usually mild, facial weakness with facial injections  -Transient, and usually mild, head or neck weakness with head/neck injections  -Reduction or loss of forehead facial animation due to forehead muscle weakness  -Eyelid drooping  -Dry eye  -Pain at the site of injection or bruising at the site of injection  -Double vision  -Potential unknown long term risks  Contraindications: You should  not have Botox if you are pregnant, nursing, allergic to albumin, have an infection, skin condition, or muscle weakness at the site of the injection, or have myasthenia gravis, Lambert-Eaton syndrome, or ALS.  It is also possible that as with any injection, there may be an allergic reaction or no effect from the medication. Reduced effectiveness after repeated injections is sometimes seen and rarely infection at the injection site may occur. All care will be taken to prevent these side effects. If therapy is given over a long time, atrophy and wasting in the muscle injected may occur. Occasionally the patient's become refractory to treatment because they develop antibodies to the toxin. In this event, therapy needs to be modified.  I have read the above information and consent to the administration of botulism toxin.    BOTOX PROCEDURE NOTE FOR MIGRAINE HEADACHE    Contraindications and precautions discussed with patient(above). Aseptic procedure was observed and patient tolerated procedure. Procedure performed by Dr. Georgia Dom  The condition has existed for more than 6 months, and pt does not have a diagnosis of ALS, Myasthenia Gravis or Lambert-Eaton Syndrome.  Risks and benefits of injections discussed and pt agrees to proceed with the procedure.  Written consent obtained  These injections are medically necessary. Pt  receives good benefits from these injections. These injections do not cause sedations or hallucinations which the oral therapies may cause.  Description of procedure:  The patient was placed in a sitting position. The standard protocol was used for Botox as follows, with 5 units of Botox injected at each site:   -Procerus muscle, midline injection  -Corrugator muscle, bilateral injection  -Frontalis muscle, bilateral injection, with 2 sites each side, medial injection was performed in the upper one third of the frontalis muscle, in the region vertical from the medial  inferior edge of the superior orbital rim. The lateral injection was again in the upper one third of the forehead vertically above the lateral limbus of the cornea, 1.5 cm lateral to the medial injection site.  -Temporalis muscle injection, 4 sites, bilaterally. The first injection was 3 cm above the tragus of the ear, second injection site was 1.5 cm to 3 cm up from the first injection site in line with the tragus of the ear. The third injection site was 1.5-3 cm forward between the first 2 injection sites. The fourth injection site was 1.5 cm posterior to the second injection site.   -Occipitalis muscle injection, 3 sites, bilaterally. The first injection was done one half way between the occipital protuberance and the tip of the mastoid process behind the ear. The second injection site was done lateral and superior to the first, 1 fingerbreadth from the first injection. The third injection site was 1 fingerbreadth superiorly and medially from the first injection site.  -Cervical paraspinal muscle injection, 2 sites, bilateral knee first injection site was 1 cm from the midline of the cervical spine, 3 cm inferior to the lower border of the occipital protuberance. The second injection site was  1.5 cm superiorly and laterally to the first injection site.  -Trapezius muscle injection was performed at 3 sites, bilaterally. The first injection site was in the upper trapezius muscle halfway between the inflection point of the neck, and the acromion. The second injection site was one half way between the acromion and the first injection site. The third injection was done between the first injection site and the inflection point of the neck.   Will return for repeat injection in 3 months.   200 units of Botox was used, any Botox not injected was wasted. The patient tolerated the procedure well, there were no complications of the above procedure.

## 2019-10-23 ENCOUNTER — Ambulatory Visit (INDEPENDENT_AMBULATORY_CARE_PROVIDER_SITE_OTHER): Payer: BC Managed Care – PPO | Admitting: Neurology

## 2019-10-23 ENCOUNTER — Other Ambulatory Visit: Payer: Self-pay

## 2019-10-23 DIAGNOSIS — J449 Chronic obstructive pulmonary disease, unspecified: Secondary | ICD-10-CM | POA: Diagnosis not present

## 2019-10-23 DIAGNOSIS — E538 Deficiency of other specified B group vitamins: Secondary | ICD-10-CM | POA: Diagnosis not present

## 2019-10-23 DIAGNOSIS — M810 Age-related osteoporosis without current pathological fracture: Secondary | ICD-10-CM | POA: Diagnosis not present

## 2019-10-23 DIAGNOSIS — G43009 Migraine without aura, not intractable, without status migrainosus: Secondary | ICD-10-CM | POA: Diagnosis not present

## 2019-10-23 DIAGNOSIS — M8700 Idiopathic aseptic necrosis of unspecified bone: Secondary | ICD-10-CM | POA: Diagnosis not present

## 2019-10-23 DIAGNOSIS — L299 Pruritus, unspecified: Secondary | ICD-10-CM | POA: Diagnosis not present

## 2019-10-23 DIAGNOSIS — E785 Hyperlipidemia, unspecified: Secondary | ICD-10-CM | POA: Diagnosis not present

## 2019-10-23 DIAGNOSIS — K5289 Other specified noninfective gastroenteritis and colitis: Secondary | ICD-10-CM | POA: Diagnosis not present

## 2019-10-23 DIAGNOSIS — I700 Atherosclerosis of aorta: Secondary | ICD-10-CM | POA: Diagnosis not present

## 2019-10-23 DIAGNOSIS — G43711 Chronic migraine without aura, intractable, with status migrainosus: Secondary | ICD-10-CM

## 2019-10-23 DIAGNOSIS — E039 Hypothyroidism, unspecified: Secondary | ICD-10-CM | POA: Diagnosis not present

## 2019-10-23 DIAGNOSIS — R000 Tachycardia, unspecified: Secondary | ICD-10-CM | POA: Diagnosis not present

## 2019-10-23 DIAGNOSIS — Z79899 Other long term (current) drug therapy: Secondary | ICD-10-CM | POA: Diagnosis not present

## 2019-10-23 NOTE — Progress Notes (Signed)
Updated Botox consent today. Sent to medical records for scanning.  Botox- 100 units x 2 vials Lot: [id number] Expiration: 12/2021 NDC: 1483-0735-43  Bacteriostatic 0.9% Sodium Chloride- 89mL total Lot: [id number] Expiration: 11/22/2020 NDC: 3979-5369-22  Dx: H00.979 SP

## 2019-10-26 ENCOUNTER — Other Ambulatory Visit: Payer: Self-pay | Admitting: Neurology

## 2019-10-26 DIAGNOSIS — G43711 Chronic migraine without aura, intractable, with status migrainosus: Secondary | ICD-10-CM

## 2019-10-28 DIAGNOSIS — Z20822 Contact with and (suspected) exposure to covid-19: Secondary | ICD-10-CM | POA: Diagnosis not present

## 2019-10-28 DIAGNOSIS — J069 Acute upper respiratory infection, unspecified: Secondary | ICD-10-CM | POA: Diagnosis not present

## 2019-10-28 DIAGNOSIS — R509 Fever, unspecified: Secondary | ICD-10-CM | POA: Diagnosis not present

## 2019-11-05 ENCOUNTER — Encounter (HOSPITAL_COMMUNITY): Payer: BC Managed Care – PPO

## 2019-11-05 ENCOUNTER — Encounter (HOSPITAL_COMMUNITY): Payer: Self-pay

## 2019-11-05 DIAGNOSIS — J309 Allergic rhinitis, unspecified: Secondary | ICD-10-CM | POA: Diagnosis not present

## 2019-11-06 DIAGNOSIS — J309 Allergic rhinitis, unspecified: Secondary | ICD-10-CM | POA: Diagnosis not present

## 2019-11-13 DIAGNOSIS — J309 Allergic rhinitis, unspecified: Secondary | ICD-10-CM | POA: Diagnosis not present

## 2019-11-22 ENCOUNTER — Inpatient Hospital Stay (HOSPITAL_COMMUNITY): Admission: RE | Admit: 2019-11-22 | Payer: BC Managed Care – PPO | Source: Ambulatory Visit

## 2019-11-22 DIAGNOSIS — M2652 Limited mandibular range of motion: Secondary | ICD-10-CM | POA: Diagnosis not present

## 2019-11-22 DIAGNOSIS — M7911 Myalgia of mastication muscle: Secondary | ICD-10-CM | POA: Diagnosis not present

## 2019-11-22 DIAGNOSIS — F458 Other somatoform disorders: Secondary | ICD-10-CM | POA: Diagnosis not present

## 2019-11-22 DIAGNOSIS — M26623 Arthralgia of bilateral temporomandibular joint: Secondary | ICD-10-CM | POA: Diagnosis not present

## 2019-12-04 ENCOUNTER — Other Ambulatory Visit: Payer: Self-pay | Admitting: Oral Surgery

## 2019-12-04 DIAGNOSIS — M791 Myalgia, unspecified site: Secondary | ICD-10-CM

## 2019-12-04 DIAGNOSIS — H9109 Ototoxic hearing loss, unspecified ear: Secondary | ICD-10-CM

## 2019-12-04 DIAGNOSIS — M26623 Arthralgia of bilateral temporomandibular joint: Secondary | ICD-10-CM

## 2019-12-04 DIAGNOSIS — M2652 Limited mandibular range of motion: Secondary | ICD-10-CM

## 2019-12-10 DIAGNOSIS — M797 Fibromyalgia: Secondary | ICD-10-CM | POA: Diagnosis not present

## 2019-12-10 DIAGNOSIS — M26602 Left temporomandibular joint disorder, unspecified: Secondary | ICD-10-CM | POA: Diagnosis not present

## 2019-12-10 DIAGNOSIS — M2559 Pain in other specified joint: Secondary | ICD-10-CM | POA: Diagnosis not present

## 2019-12-10 DIAGNOSIS — M26623 Arthralgia of bilateral temporomandibular joint: Secondary | ICD-10-CM | POA: Diagnosis not present

## 2019-12-14 DIAGNOSIS — M797 Fibromyalgia: Secondary | ICD-10-CM | POA: Diagnosis not present

## 2019-12-14 DIAGNOSIS — M2559 Pain in other specified joint: Secondary | ICD-10-CM | POA: Diagnosis not present

## 2019-12-14 DIAGNOSIS — M26602 Left temporomandibular joint disorder, unspecified: Secondary | ICD-10-CM | POA: Diagnosis not present

## 2019-12-14 DIAGNOSIS — M26623 Arthralgia of bilateral temporomandibular joint: Secondary | ICD-10-CM | POA: Diagnosis not present

## 2019-12-18 ENCOUNTER — Other Ambulatory Visit: Payer: Self-pay | Admitting: Neurology

## 2019-12-18 ENCOUNTER — Telehealth: Payer: Self-pay | Admitting: Family Medicine

## 2019-12-18 DIAGNOSIS — M2559 Pain in other specified joint: Secondary | ICD-10-CM | POA: Diagnosis not present

## 2019-12-18 DIAGNOSIS — M26623 Arthralgia of bilateral temporomandibular joint: Secondary | ICD-10-CM | POA: Diagnosis not present

## 2019-12-18 DIAGNOSIS — M797 Fibromyalgia: Secondary | ICD-10-CM | POA: Diagnosis not present

## 2019-12-18 DIAGNOSIS — M26602 Left temporomandibular joint disorder, unspecified: Secondary | ICD-10-CM | POA: Diagnosis not present

## 2019-12-18 MED ORDER — KETOROLAC TROMETHAMINE 60 MG/2ML IM SOLN: INTRAMUSCULAR | 2 refills | Status: DC

## 2019-12-18 NOTE — Telephone Encounter (Signed)
Patient has a Botox appointment on 12/7 with Amy. I called [facility] and spoke with Altha Harm to schedule Botox delivery. Christine set up Botox delivery for 10/28.

## 2019-12-20 NOTE — Telephone Encounter (Signed)
(  2) 100U Botox vials delivered today from [place] for patient's 12/7 appointment.

## 2019-12-21 ENCOUNTER — Other Ambulatory Visit (HOSPITAL_COMMUNITY): Payer: Self-pay | Admitting: *Deleted

## 2019-12-22 ENCOUNTER — Inpatient Hospital Stay: Admission: RE | Admit: 2019-12-22 | Payer: BC Managed Care – PPO | Source: Ambulatory Visit

## 2019-12-24 ENCOUNTER — Other Ambulatory Visit: Payer: BC Managed Care – PPO

## 2019-12-24 ENCOUNTER — Other Ambulatory Visit: Payer: Self-pay

## 2019-12-24 ENCOUNTER — Ambulatory Visit (HOSPITAL_COMMUNITY)
Admission: RE | Admit: 2019-12-24 | Discharge: 2019-12-24 | Disposition: A | Discharge status: Home / Self Care | Payer: BC Managed Care – PPO | Source: Ambulatory Visit | Attending: Internal Medicine | Admitting: Internal Medicine

## 2019-12-24 DIAGNOSIS — Z95828 Presence of other vascular implants and grafts: Secondary | ICD-10-CM | POA: Diagnosis not present

## 2019-12-24 LAB — CBC WITH DIFFERENTIAL/PLATELET
Abs Immature Granulocytes: 0.01 10*3/uL (ref 0.00–0.07)
Basophils Absolute: 0 10*3/uL (ref 0.0–0.1)
Basophils Relative: 0 %
Eosinophils Absolute: 0.4 10*3/uL (ref 0.0–0.5)
Eosinophils Relative: 5 %
HCT: 36.7 % (ref 36.0–46.0)
Hemoglobin: 12 g/dL (ref 12.0–15.0)
Immature Granulocytes: 0 %
Lymphocytes Relative: 25 %
Lymphs Abs: 1.9 10*3/uL (ref 0.7–4.0)
MCH: 28.2 pg (ref 26.0–34.0)
MCHC: 32.7 g/dL (ref 30.0–36.0)
MCV: 86.4 fL (ref 80.0–100.0)
Monocytes Absolute: 0.7 10*3/uL (ref 0.1–1.0)
Monocytes Relative: 9 %
Neutro Abs: 4.8 10*3/uL (ref 1.7–7.7)
Neutrophils Relative %: 61 %
Platelets: 303 10*3/uL (ref 150–400)
RBC: 4.25 MIL/uL (ref 3.87–5.11)
RDW: 13.2 % (ref 11.5–15.5)
WBC: 7.8 10*3/uL (ref 4.0–10.5)
nRBC: 0 % (ref 0.0–0.2)

## 2019-12-24 LAB — LIPID PANEL
Cholesterol: 249 mg/dL — ABNORMAL HIGH (ref 0–200)
HDL: 49 mg/dL (ref >40)
LDL Cholesterol: 171 mg/dL — ABNORMAL HIGH (ref 0–99)
Total CHOL/HDL Ratio: 5.1 RATIO
Triglycerides: 143 mg/dL (ref <150)
VLDL: 29 mg/dL (ref 0–40)

## 2019-12-24 LAB — COMPREHENSIVE METABOLIC PANEL
ALT: 27 U/L (ref 0–44)
AST: 30 U/L (ref 15–41)
Albumin: 3.3 g/dL — ABNORMAL LOW (ref 3.5–5.0)
Alkaline Phosphatase: 55 U/L (ref 38–126)
Anion gap: 6 (ref 5–15)
BUN: 13 mg/dL (ref 6–20)
CO2: 23 mmol/L (ref 22–32)
Calcium: 8.8 mg/dL — ABNORMAL LOW (ref 8.9–10.3)
Chloride: 112 mmol/L — ABNORMAL HIGH (ref 98–111)
Creatinine, Ser: 0.95 mg/dL (ref 0.44–1.00)
GFR, Estimated: >60 mL/min (ref >60)
Glucose, Bld: 99 mg/dL (ref 70–99)
Potassium: 3.8 mmol/L (ref 3.5–5.1)
Sodium: 141 mmol/L (ref 135–145)
Total Bilirubin: 0.7 mg/dL (ref 0.3–1.2)
Total Protein: 6.4 g/dL — ABNORMAL LOW (ref 6.5–8.1)

## 2019-12-24 LAB — VITAMIN B12: Vitamin B-12: 1108 pg/mL — ABNORMAL HIGH (ref 180–914)

## 2019-12-24 LAB — TSH: TSH: 0.022 u[IU]/mL — ABNORMAL LOW (ref 0.350–4.500)

## 2019-12-24 MED ORDER — HEPARIN SOD (PORK) LOCK FLUSH 100 UNIT/ML IV SOLN: 500.0000 [IU] | INTRAVENOUS | Status: DC

## 2019-12-24 MED ORDER — HEPARIN SOD (PORK) LOCK FLUSH 100 UNIT/ML IV SOLN: 500.0000 [IU] | INTRAVENOUS | Status: DC | PRN

## 2019-12-24 MED ORDER — ALTEPLASE 2 MG IJ SOLR
INTRAMUSCULAR | Status: AC
  Administered 2019-12-24: 2 mg
  Filled 2019-12-24: qty 2

## 2019-12-24 MED ORDER — HEPARIN SOD (PORK) LOCK FLUSH 100 UNIT/ML IV SOLN
INTRAVENOUS | Status: AC
  Administered 2019-12-24: 500 [IU]
  Filled 2019-12-24: qty 5

## 2019-12-24 MED ORDER — ALTEPLASE 2 MG IJ SOLR: 2.0000 mg | Freq: Once | INTRAMUSCULAR | Status: AC

## 2019-12-25 DIAGNOSIS — M797 Fibromyalgia: Secondary | ICD-10-CM | POA: Diagnosis not present

## 2019-12-25 DIAGNOSIS — M26623 Arthralgia of bilateral temporomandibular joint: Secondary | ICD-10-CM | POA: Diagnosis not present

## 2019-12-25 DIAGNOSIS — M26602 Left temporomandibular joint disorder, unspecified: Secondary | ICD-10-CM | POA: Diagnosis not present

## 2019-12-25 DIAGNOSIS — M2559 Pain in other specified joint: Secondary | ICD-10-CM | POA: Diagnosis not present

## 2019-12-29 ENCOUNTER — Ambulatory Visit (HOSPITAL_BASED_OUTPATIENT_CLINIC_OR_DEPARTMENT_OTHER)
Admission: RE | Admit: 2019-12-29 | Discharge: 2019-12-29 | Disposition: A | Discharge status: Home / Self Care | Payer: BC Managed Care – PPO | Source: Ambulatory Visit | Attending: Oral Surgery | Admitting: Oral Surgery

## 2019-12-29 ENCOUNTER — Other Ambulatory Visit: Payer: Self-pay

## 2019-12-29 DIAGNOSIS — M791 Myalgia, unspecified site: Secondary | ICD-10-CM

## 2019-12-29 DIAGNOSIS — M26623 Arthralgia of bilateral temporomandibular joint: Secondary | ICD-10-CM | POA: Diagnosis not present

## 2019-12-29 DIAGNOSIS — H9109 Ototoxic hearing loss, unspecified ear: Secondary | ICD-10-CM

## 2019-12-29 DIAGNOSIS — M2652 Limited mandibular range of motion: Secondary | ICD-10-CM | POA: Diagnosis not present

## 2019-12-29 DIAGNOSIS — G8929 Other chronic pain: Secondary | ICD-10-CM | POA: Diagnosis not present

## 2019-12-29 DIAGNOSIS — M25511 Pain in right shoulder: Secondary | ICD-10-CM | POA: Diagnosis not present

## 2020-01-07 DIAGNOSIS — J309 Allergic rhinitis, unspecified: Secondary | ICD-10-CM | POA: Diagnosis not present

## 2020-01-09 DIAGNOSIS — Z0000 Encounter for general adult medical examination without abnormal findings: Secondary | ICD-10-CM | POA: Diagnosis not present

## 2020-01-11 DIAGNOSIS — Z20822 Contact with and (suspected) exposure to covid-19: Secondary | ICD-10-CM | POA: Diagnosis not present

## 2020-01-24 DIAGNOSIS — M25512 Pain in left shoulder: Secondary | ICD-10-CM | POA: Diagnosis not present

## 2020-01-24 DIAGNOSIS — Z23 Encounter for immunization: Secondary | ICD-10-CM | POA: Diagnosis not present

## 2020-01-24 DIAGNOSIS — Z79899 Other long term (current) drug therapy: Secondary | ICD-10-CM | POA: Diagnosis not present

## 2020-01-24 DIAGNOSIS — J449 Chronic obstructive pulmonary disease, unspecified: Secondary | ICD-10-CM | POA: Diagnosis not present

## 2020-01-24 DIAGNOSIS — R609 Edema, unspecified: Secondary | ICD-10-CM | POA: Diagnosis not present

## 2020-01-24 DIAGNOSIS — K861 Other chronic pancreatitis: Secondary | ICD-10-CM | POA: Diagnosis not present

## 2020-01-25 ENCOUNTER — Other Ambulatory Visit (HOSPITAL_COMMUNITY): Payer: Self-pay | Admitting: *Deleted

## 2020-01-25 DIAGNOSIS — M2652 Limited mandibular range of motion: Secondary | ICD-10-CM | POA: Diagnosis not present

## 2020-01-25 DIAGNOSIS — M7911 Myalgia of mastication muscle: Secondary | ICD-10-CM | POA: Diagnosis not present

## 2020-01-25 DIAGNOSIS — F458 Other somatoform disorders: Secondary | ICD-10-CM | POA: Diagnosis not present

## 2020-01-25 DIAGNOSIS — M26623 Arthralgia of bilateral temporomandibular joint: Secondary | ICD-10-CM | POA: Diagnosis not present

## 2020-01-25 DIAGNOSIS — R072 Precordial pain: Secondary | ICD-10-CM

## 2020-01-28 ENCOUNTER — Other Ambulatory Visit: Payer: Self-pay

## 2020-01-28 ENCOUNTER — Ambulatory Visit (HOSPITAL_COMMUNITY)
Admission: RE | Admit: 2020-01-28 | Discharge: 2020-01-28 | Disposition: A | Discharge status: Home / Self Care | Payer: BC Managed Care – PPO | Source: Ambulatory Visit | Attending: Internal Medicine | Admitting: Internal Medicine

## 2020-01-28 DIAGNOSIS — Z452 Encounter for adjustment and management of vascular access device: Secondary | ICD-10-CM | POA: Diagnosis not present

## 2020-01-28 MED ORDER — HEPARIN SOD (PORK) LOCK FLUSH 100 UNIT/ML IV SOLN
500.0000 [IU] | INTRAVENOUS | Status: DC
  Administered 2020-01-28: 500 [IU]

## 2020-01-28 MED ORDER — HEPARIN SOD (PORK) LOCK FLUSH 100 UNIT/ML IV SOLN: 500.0000 [IU] | INTRAVENOUS | Status: DC | PRN

## 2020-01-28 MED ORDER — HEPARIN SOD (PORK) LOCK FLUSH 100 UNIT/ML IV SOLN
INTRAVENOUS | Status: AC
  Filled 2020-01-28: qty 5

## 2020-01-28 NOTE — Progress Notes (Deleted)
Update 01/29/2020:   Per last note from AA: 10/23/2019: Still doing great, no migraines until botox is due the 1-2 weeks prior. We sent toradol to her home injections. Clenching is a big issue.    Consent Form Botulism Toxin Injection For Chronic Migraine    Reviewed orally with patient, additionally signature is on file:  Botulism toxin has been approved by the Federal drug administration for treatment of chronic migraine. Botulism toxin does not cure chronic migraine and it may not be effective in some patients.  The administration of botulism toxin is accomplished by injecting a small amount of toxin into the muscles of the neck and head. Dosage must be titrated for each individual. Any benefits resulting from botulism toxin tend to wear off after 3 months with a repeat injection required if benefit is to be maintained. Injections are usually done every 3-4 months with maximum effect peak achieved by about 2 or 3 weeks. Botulism toxin is expensive and you should be sure of what costs you will incur resulting from the injection.  The side effects of botulism toxin use for chronic migraine may include:   -Transient, and usually mild, facial weakness with facial injections  -Transient, and usually mild, head or neck weakness with head/neck injections  -Reduction or loss of forehead facial animation due to forehead muscle weakness  -Eyelid drooping  -Dry eye  -Pain at the site of injection or bruising at the site of injection  -Double vision  -Potential unknown long term risks  Contraindications: You should not have Botox if you are pregnant, nursing, allergic to albumin, have an infection, skin condition, or muscle weakness at the site of the injection, or have myasthenia gravis, Lambert-Eaton syndrome, or ALS.  It is also possible that as with any injection, there may be an allergic reaction or no effect from the medication. Reduced effectiveness after repeated injections is sometimes seen  and rarely infection at the injection site may occur. All care will be taken to prevent these side effects. If therapy is given over a long time, atrophy and wasting in the muscle injected may occur. Occasionally the patient's become refractory to treatment because they develop antibodies to the toxin. In this event, therapy needs to be modified.  I have read the above information and consent to the administration of botulism toxin.    BOTOX PROCEDURE NOTE FOR MIGRAINE HEADACHE    Contraindications and precautions discussed with patient(above). Aseptic procedure was observed and patient tolerated procedure. Procedure performed by Dr. Georgia Dom  The condition has existed for more than 6 months, and pt does not have a diagnosis of ALS, Myasthenia Gravis or Lambert-Eaton Syndrome.  Risks and benefits of injections discussed and pt agrees to proceed with the procedure.  Written consent obtained  These injections are medically necessary. Pt  receives good benefits from these injections. These injections do not cause sedations or hallucinations which the oral therapies may cause.  Description of procedure:  The patient was placed in a sitting position. The standard protocol was used for Botox as follows, with 5 units of Botox injected at each site:   -Procerus muscle, midline injection  -Corrugator muscle, bilateral injection  -Frontalis muscle, bilateral injection, with 2 sites each side, medial injection was performed in the upper one third of the frontalis muscle, in the region vertical from the medial inferior edge of the superior orbital rim. The lateral injection was again in the upper one third of the forehead vertically above the lateral limbus  of the cornea, 1.5 cm lateral to the medial injection site.  -Temporalis muscle injection, 4 sites, bilaterally. The first injection was 3 cm above the tragus of the ear, second injection site was 1.5 cm to 3 cm up from the first injection site  in line with the tragus of the ear. The third injection site was 1.5-3 cm forward between the first 2 injection sites. The fourth injection site was 1.5 cm posterior to the second injection site.   -Occipitalis muscle injection, 3 sites, bilaterally. The first injection was done one half way between the occipital protuberance and the tip of the mastoid process behind the ear. The second injection site was done lateral and superior to the first, 1 fingerbreadth from the first injection. The third injection site was 1 fingerbreadth superiorly and medially from the first injection site.  -Cervical paraspinal muscle injection, 2 sites, bilateral knee first injection site was 1 cm from the midline of the cervical spine, 3 cm inferior to the lower border of the occipital protuberance. The second injection site was 1.5 cm superiorly and laterally to the first injection site.  -Trapezius muscle injection was performed at 3 sites, bilaterally. The first injection site was in the upper trapezius muscle halfway between the inflection point of the neck, and the acromion. The second injection site was one half way between the acromion and the first injection site. The third injection was done between the first injection site and the inflection point of the neck.   Will return for repeat injection in 3 months.   200 units of Botox was used, any Botox not injected was wasted. The patient tolerated the procedure well, there were no complications of the above procedure.

## 2020-01-29 ENCOUNTER — Ambulatory Visit: Payer: BC Managed Care – PPO | Admitting: Family Medicine

## 2020-01-29 DIAGNOSIS — G43711 Chronic migraine without aura, intractable, with status migrainosus: Secondary | ICD-10-CM

## 2020-01-30 ENCOUNTER — Ambulatory Visit (INDEPENDENT_AMBULATORY_CARE_PROVIDER_SITE_OTHER): Payer: BC Managed Care – PPO | Admitting: Family Medicine

## 2020-01-30 DIAGNOSIS — G43709 Chronic migraine without aura, not intractable, without status migrainosus: Secondary | ICD-10-CM | POA: Diagnosis not present

## 2020-01-30 NOTE — Progress Notes (Signed)
Botox-100unitsx2 vials Lot: [id number] Expiration: 03/2022 NDC: 4045-9136-85   0.9% Sodium Chloride- 18mL total Lot: [id number] Expiration: 11/2021 NDC: 43601-658-00  Dx: Chronic Migraine SP  Consent signed

## 2020-01-30 NOTE — Progress Notes (Addendum)
She reports that botox continues to help with migraine prevention. She is also receiving RFA and working with oral surgeon for TMJ. She has significant clenching. She requests we not inject occipital or cervical muscles today. No masseters.  .  Consent Form Botulism Toxin Injection For Chronic Migraine    Reviewed orally with patient, additionally signature is on file:  Botulism toxin has been approved by the Federal drug administration for treatment of chronic migraine. Botulism toxin does not cure chronic migraine and it may not be effective in some patients.  The administration of botulism toxin is accomplished by injecting a small amount of toxin into the muscles of the neck and head. Dosage must be titrated for each individual. Any benefits resulting from botulism toxin tend to wear off after 3 months with a repeat injection required if benefit is to be maintained. Injections are usually done every 3-4 months with maximum effect peak achieved by about 2 or 3 weeks. Botulism toxin is expensive and you should be sure of what costs you will incur resulting from the injection.  The side effects of botulism toxin use for chronic migraine may include:   -Transient, and usually mild, facial weakness with facial injections  -Transient, and usually mild, head or neck weakness with head/neck injections  -Reduction or loss of forehead facial animation due to forehead muscle weakness  -Eyelid drooping  -Dry eye  -Pain at the site of injection or bruising at the site of injection  -Double vision  -Potential unknown long term risks   Contraindications: You should not have Botox if you are pregnant, nursing, allergic to albumin, have an infection, skin condition, or muscle weakness at the site of the injection, or have myasthenia gravis, Lambert-Eaton syndrome, or ALS.  It is also possible that as with any injection, there may be an allergic reaction or no effect from the medication. Reduced  effectiveness after repeated injections is sometimes seen and rarely infection at the injection site may occur. All care will be taken to prevent these side effects. If therapy is given over a long time, atrophy and wasting in the muscle injected may occur. Occasionally the patient's become refractory to treatment because they develop antibodies to the toxin. In this event, therapy needs to be modified.  I have read the above information and consent to the administration of botulism toxin.    BOTOX PROCEDURE NOTE FOR MIGRAINE HEADACHE  Contraindications and precautions discussed with patient(above). Aseptic procedure was observed and patient tolerated procedure. Procedure performed by Debbora Presto, FNP-C.   The condition has existed for more than 6 months, and pt does not have a diagnosis of ALS, Myasthenia Gravis or Lambert-Eaton Syndrome.  Risks and benefits of injections discussed and pt agrees to proceed with the procedure.  Written consent obtained  These injections are medically necessary. Pt  receives good benefits from these injections. These injections do not cause sedations or hallucinations which the oral therapies may cause.   Description of procedure:  The patient was placed in a sitting position. The standard protocol was used for Botox as follows, with 5 units of Botox injected at each site:  -Procerus muscle, midline injection  -Corrugator muscle, bilateral injection  -Frontalis muscle, bilateral injection, with 2 sites each side, medial injection was performed in the upper one third of the frontalis muscle, in the region vertical from the medial inferior edge of the superior orbital rim. The lateral injection was again in the upper one third of the forehead vertically  above the lateral limbus of the cornea, 1.5 cm lateral to the medial injection site.  -Temporalis muscle injection, 4 sites, bilaterally. The first injection was 3 cm above the tragus of the ear, second injection  site was 1.5 cm to 3 cm up from the first injection site in line with the tragus of the ear. The third injection site was 1.5-3 cm forward between the first 2 injection sites. The fourth injection site was 1.5 cm posterior to the second injection site. 5th site laterally in the temporalis  muscleat the level of the outer canthus.  -bilateral temporalis for clenching administered, 5 units each    -Trapezius muscle injection was performed at 3 sites, bilaterally. The first injection site was in the upper trapezius muscle halfway between the inflection point of the neck, and the acromion. The second injection site was one half way between the acromion and the first injection site. The third injection was done between the first injection site and the inflection point of the neck.  -LS bilaterally, 5 units each   Will return for repeat injection in 3 months.   A total of 200 units of Botox was prepared, any Botox not injected was wasted. The patient tolerated the procedure well, there were no complications of the above procedure.  Agree with procedure. Jaynee Eagles MD

## 2020-02-05 ENCOUNTER — Telehealth: Payer: Self-pay | Admitting: Cardiology

## 2020-02-05 ENCOUNTER — Encounter: Payer: Self-pay | Admitting: Cardiology

## 2020-02-05 ENCOUNTER — Other Ambulatory Visit: Payer: Self-pay

## 2020-02-05 ENCOUNTER — Encounter (HOSPITAL_COMMUNITY): Payer: BC Managed Care – PPO

## 2020-02-05 ENCOUNTER — Ambulatory Visit (INDEPENDENT_AMBULATORY_CARE_PROVIDER_SITE_OTHER): Payer: BC Managed Care – PPO | Admitting: Cardiology

## 2020-02-05 VITALS — BP 116/70 | HR 101 | Ht 63.0 in | Wt 150.0 lb

## 2020-02-05 DIAGNOSIS — R079 Chest pain, unspecified: Secondary | ICD-10-CM | POA: Diagnosis not present

## 2020-02-05 DIAGNOSIS — E7800 Pure hypercholesterolemia, unspecified: Secondary | ICD-10-CM

## 2020-02-05 DIAGNOSIS — R002 Palpitations: Secondary | ICD-10-CM

## 2020-02-05 DIAGNOSIS — R072 Precordial pain: Secondary | ICD-10-CM

## 2020-02-05 NOTE — Progress Notes (Addendum)
error 

## 2020-02-05 NOTE — Addendum Note (Signed)
Addended by: Antonieta Iba on: 02/05/2020 04:05 PM   Modules accepted: Orders

## 2020-02-05 NOTE — Patient Instructions (Addendum)
Medication Instructions:  Your physician recommends that you continue on your current medications as directed. Please refer to the Current Medication list given to you today.  *If you need a refill on your cardiac medications before your next appointment, please call your pharmacy*  Lab Work: TODAY: troponin, TSH, T3, T4 If you have labs (blood work) drawn today and your tests are completely normal, you will receive your results only by: Marland Kitchen MyChart Message (if you have MyChart) OR . A paper copy in the mail If you have any lab test that is abnormal or we need to change your treatment, we will call you to review the results.  Testing/Procedures: Your physician has recommended that you wear an event monitor. Event monitors are medical devices that record the heart's electrical activity. Doctors most often [place] these monitors to diagnose arrhythmias. Arrhythmias are problems with the speed or rhythm of the heartbeat. The monitor is a small, portable device. You can wear one while you do your normal daily activities. This is usually used to diagnose what is causing palpitations/syncope (passing out).  Your physician has requested that you have en exercise stress myoview. For further information please visit [email or web address]. Please follow instruction sheet, as given.  Your physician has requested that you have a calcium score CT scan.   Follow-Up: At [facility], you and your health needs are our priority.  As part of our continuing mission to provide you with exceptional heart care, we have created designated Provider Care Teams.  These Care Teams include your primary Cardiologist (physician) and Advanced Practice Providers (APPs -  Physician Assistants and Nurse Practitioners) who all work together to provide you with the care you need, when you need it.  Follow up with Dr. Radford Pax as needed based on results of testing.

## 2020-02-05 NOTE — Addendum Note (Signed)
Addended by: Antonieta Iba on: 02/05/2020 03:14 PM   Modules accepted: Orders

## 2020-02-05 NOTE — Telephone Encounter (Signed)
° °  Dee with pt care center returning Carly's call, while on hold line got disconnected

## 2020-02-05 NOTE — Progress Notes (Signed)
Cardiology Consult  Note    Date:  02/05/2020   ID:  Kari Green, DOB [birth year], MRN [id number]  PCP:  Jani Gravel, MD  Cardiologist:  Fransico Him, MD   No chief complaint on file.   History of Present Illness:  Kari Green is a 54 y.o. female who is being seen today for the evaluation of atrial fibrillation at the request of Deland Pretty, MD.  This is a 54yo female with a hx of anxiety, asthma, fibromyalgia and chronic migraine HAs.  She is referred for evaluation of PAF and palpitations.  She tells me that she was dx with PAF years ago although it is not in her PMH and I cannot find a hospitalization for it. She saw Dr. Percival Spanish back in 2017 for atypical CP and had normal ETT.  She was seen in the past year by Dr. Earnie Larsson and event monitor was recommended but she refused due to allergies to patches.  She has an extensive allergy list and uses a port for access to blood draws.     She says that for the past month she has had episode where she feels like her body is shaking.  She says that she has been in the bed in the past and thinks it is the bed shaking but her husband will tell her that it is her that is shaking.  Once that starts she will get clammy and then feel sick to her stomach and vomit.  She got a kardia mobile device and says that it will register as a high heart rate and goes up to 109bpm.  Once she vomits her sx ease up.  She had an episode os slurred speech the other day associated with it.  She occasionally gets pressure in her chest with it but the pain starts before, during and after it.  It is nonexertional. Today she had jaw pain associated with it.  Occasionally there is radiation of the pressure into her arms.  She denies any SOB.  She has a hx of tobacco use and quit a year ago. Her mom had an MI at age 59yo and had a CVA as well.  Her father has CAD and has had multiple heart surgeries and SCD.  She has both COVID 19 vaccines.    Past Medical History:   Diagnosis Date  . Anemia    Slight  . Anxiety   . Arthritis    right thumb  . Asthma   . Avascular necrosis (HCC)    left shoulder, right knee, jaws/mouth that she knows of at this time  . Basal cell carcinoma   . Chronic migraine without aura, intractable, with status migrainosus    neurologist-  dr Jaynee Eagles--  receives botox injections every 3 months  . Chronic, continuous use of opioids   . Complication of anesthesia    "not asleep and aware of everything"  . Degenerative disc disease, cervical   . Degenerative disc disease, lumbar   . Delayed gastric emptying   . Difficult intravenous access    Now has Powerport for access  . Difficulty swallowing pills   . Enlarged lymph node in neck    left  . Esophageal dysmotility   . Family history of adverse reaction to anesthesia    family - itching, hives, PONV  . Family history of breast cancer 08/29/2017  . Family history of leukemia 08/29/2017  . Family history of ovarian cancer 08/29/2017  . Family history of prostate  cancer 08/29/2017  . Family history of thyroid cancer 08/29/2017  . Fatty liver    PMH  . Fibromyalgia   . H/O blood transfusion reaction    x 3; last one caused hives an d cellulitis  . Hemangioma of liver   . Hemangioma, renal   . Hiatal hernia   . History of exercise stress test 05/08/2015   negative stress test without evidence of ischemia at given workload  . History of kidney stones 04/2014  . History of skin cancer    excision melanoma - scalp;  excision behind knee  . Hypertension   . IC (interstitial cystitis)    urologist-  dr Louis Meckel  . Irritable bowel syndrome (IBS)   . Knee pain, right    states gets "nerves burned" periodically  . Limited joint range of motion    right knee - unable to fully extend right leg  . Melanoma ([place])   . Memory impairment    due to pain   . Osteoporosis   . Pain syndrome, chronic   . Pneumonia    history of X2  . PONV (postoperative nausea and vomiting)     uncontrollable itching with anesthesia; hx. of anesthesia awareness; pt request needs 50  mg of Benadryl IV/phenergan 50 mg IV  . Port-A-Cath in place 11/04/2016  . Post-surgical hypothyroidism   . Sciatica    bilateral  . Seasonal allergies   . Squamous carcinoma     Past Surgical History:  Procedure Laterality Date  . ANTERIOR CERVICAL DECOMP/DISCECTOMY FUSION  03/10/2009   C5-6, C6-7  . ANTERIOR CERVICAL DECOMP/DISCECTOMY FUSION N/A 07/16/2015   Procedure: ANTERIOR CERVICAL DECOMPRESSION FUSION 4-5 WITH INSTRUMENTATION AND AUTOGRAFT;  Surgeon: Phylliss Bob, MD;  Location: [facility];  Service: Orthopedics;  Laterality: N/A;  ANTERIOR CERVICAL DECOMPRESSION FUSION 4-5 WITH INSTRUMENTATION AND ALLOGRAFT  . APPENDECTOMY  ~ 1992  . AUGMENTATION MAMMAPLASTY Bilateral 1996; 2001, 2016  . BREAST SURGERY  age 4   accessory breast tissue exc  . CARPAL TUNNEL RELEASE Bilateral right 07/26/2001;  left ?  Mortimer Fries SUSPENSION PLASTY Right 02/10/2015   Procedure: RIGHT THUMB SUSPENSION  ARTHROPLASTY;  Surgeon: Milly Jakob, MD;  Location: [place];  Service: Orthopedics;  Laterality: Right;  ANESTHESIA: PRE- OP BLOCK  . CYSTO WITH HYDRODISTENSION  01/29/2011   Procedure: CYSTOSCOPY/HYDRODISTENSION;  Surgeon: Claybon Jabs, MD;  Location: [facility];  Service: Urology;  Laterality: N/A;  . CYSTO WITH HYDRODISTENSION N/A 05/08/2014   Procedure: CYSTOSCOPY/HYDRODISTENSION WITH BILATERAL RETROGRADES;  Surgeon: Ardis Hughs, MD;  Location: [facility];  Service: Urology;  Laterality: N/A;  . CYSTO WITH HYDRODISTENSION N/A 03/27/2015   Procedure: CYSTOSCOPY/HYDRODISTENSION OF BLADDER, INSTILLATION OF MARCAINE/PYRIDIUM;  Surgeon: Ardis Hughs, MD;  Location: [facility];  Service: Urology;  Laterality: N/A;  . CYSTO/  BLADDER BX  03/08/2008  . CYSTO/ URETHRAL DILATION/ HYDRODISTENTION/ BLADDER BX  04/10/2003  . CYSTOSCOPY W/  RETROGRADES  01/29/2011   Procedure: CYSTOSCOPY WITH RETROGRADE PYELOGRAM;  Surgeon: Claybon Jabs, MD;  Location: [facility];  Service: Urology;  Laterality: Bilateral;  . CYSTOSCOPY WITH HYDRODISTENSION AND BIOPSY N/A 06/29/2017   Procedure: CYSTOSCOPY/HYDRODISTENSION;  Surgeon: Ardis Hughs, MD;  Location: [facility];  Service: Urology;  Laterality: N/A;  . CYSTOSCOPY WITH STENT PLACEMENT  2006   URETERAL  . DEBRIDEMENT RIGHT THUMB MP JOINT Right 04/10/2002  . DECOMPRESSION LEFT ULNAR NERVE, ELBOW Left 01/29/2003   2nd  time in 2015  . ESOPHAGOGASTRODUODENOSCOPY (EGD) WITH ESOPHAGEAL DILATION  10-26-2000  . EUS N/A 04/23/2016   Procedure: UPPER ENDOSCOPIC ULTRASOUND (EUS) LINEAR;  Surgeon: Carol Ada, MD;  Location: [facility];  Service: Endoscopy;  Laterality: N/A;  . EXCISION HAGLUND'S DEFORMITY WITH ACHILLES TENDON REPAIR Bilateral 2013-2014  . FISSURECTOMY  ~ 1988 x2  . GANGLION CYST EXCISION Right 02/10/2015   Procedure: REMOVAL GANGLION OF WRIST, PARTIAL ULNAR EXCISION;  Surgeon: Milly Jakob, MD;  Location: [place];  Service: Orthopedics;  Laterality: Right;  . IR FLUORO GUIDE PORT INSERTION RIGHT  11/04/2016  . IR US GUIDE VASC ACCESS RIGHT  11/04/2016  . KIDNEY STONE SURGERY    . KNEE ARTHROSCOPY Right 08/16/2000  . KNEE ARTHROSCOPY WITH FULKERSON SLIDE Right 11/29/2000  . LAPAROSCOPIC ASSISTED VAGINAL HYSTERECTOMY  Feb 1999  . LAPAROSCOPIC CHOLECYSTECTOMY  03/04/2002  . LAPAROSCOPIC LYSIS OF ADHESIONS  02/11/2012   Procedure: LAPAROSCOPIC LYSIS OF ADHESIONS;  Surgeon: Cheri Fowler, MD;  Location: [facility];  Service: Gynecology;;  . LAPAROSCOPY  02/11/2012   Procedure: LAPAROSCOPY DIAGNOSTIC;  Surgeon: Cheri Fowler, MD;  Location: [facility];  Service: Gynecology;  Laterality: N/A;  Diagnostic  Operative Laparoscopic   . LAPAROSCOPY ADHESIOLYSIS AND REMOVAL POSSIBLE RIGHT OVARY REMNANT  04/18/2000  . LAPAROSCOPY LYSIS ADHESIONS/   BILATERAL SALPINGOOPHORECTOMY/  [place] OF ENDOMETRIOSIS  1998  . LYMPH NODE BIOPSY Left 07/24/2018   Procedure: CERVICAL LYMPH NODE EXCISION;  Surgeon: Melida Quitter, MD;  Location: [place];  Service: ENT;  Laterality: Left;  Marland Kitchen MELANOMA EXCISION Left 2013   "behind knee"   . NASAL SINUS SURGERY  2013  . NEGATIVE SLEEP STUDY  2006 approx .  per pt  . REFRACTIVE SURGERY Bilateral ~ 1999; ~ 2013  . RIGHT THUMB FUSION OF MPJ Right 10/03/2002  . RIGHT TOTAL KNEE REVISION ARTHROPLASTY Right 03/14/2007  . SHOULDER ARTHROSCOPY Left    put back in place  . SHOULDER HEMI-ARTHROPLASTY Left 12/04/2015   Procedure: SHOULDER HEMI-ARTHROPLASTY;  Surgeon: Tania Ade, MD;  Location: [place];  Service: Orthopedics;  Laterality: Left;  Left shoulder hemiarthroplasty  . sympathetic nerve ablation  07/05/2017  . TONSILLECTOMY AND ADENOIDECTOMY  ~ 1989  . TOTAL KNEE ARTHROPLASTY Right 09/10/2003; 12/08/2012  . TOTAL THYROIDECTOMY  1993  . TRANSANAL HEMORRHOIDAL DEARTERIALIZATION  03/26/2011  . TRANSTHORACIC ECHOCARDIOGRAM  09/22/2012  . TRIGGER FINGER RELEASE Left 07/06/2016   Procedure: LEFT THUMB TRIGGER RELEASE;  Surgeon: Milly Jakob, MD;  Location: [place];  Service: Orthopedics;  Laterality: Left;  Marland Kitchen VIDEO BRONCHOSCOPY Bilateral 10/20/2015   Procedure: VIDEO BRONCHOSCOPY WITHOUT FLUORO;  Surgeon: Collene Gobble, MD;  Location: [place];  Service: Cardiopulmonary;  Laterality: Bilateral;    Current Medications: Current Meds  Medication Sig  . ALPRAZolam (XANAX) 0.5 MG tablet Take 1 mg by mouth 3 (three) times daily as needed. Anxiety  . botulinum toxin Type A (BOTOX) 100 units SOLR injection INJECT 155 UNITS INTO THE MUSCLES OF THE HEAD AND NECK EVERY 3 MONTHS, BY PRESCRIBER IN OFFICE FOR ADMINSTRATION .DISCARD UNUSED PORTION  . cyanocobalamin (,VITAMIN B-12,) 1000 MCG/ML injection Inject 1,000 mcg into the muscle every Tuesday. Reported on 03/25/2015  . diclofenac sodium  (VOLTAREN) 1 % GEL Apply 1 application topically 4 (four) times daily as needed for pain.  . diphenhydrAMINE (BENADRYL) 50 MG tablet Take 50 mg by mouth every 4 (four) hours as needed for itching.   . estradiol (ESTRACE) 0.5 MG tablet Take 1 mg by mouth  at bedtime.   Marland Kitchen EVZIO 0.4 MG/0.4ML SOAJ AUTO INJECT AS NEEDED IN CASE OF EMERGENCY WHERE GRANDCHILDREN MAY GET A HOLD OF PATIENTS PAIN MEDICATIONS.  . fluticasone (FLONASE) 50 MCG/ACT nasal spray SPRAY 2 SPRAYS INTO EACH NOSTRIL DAILY (Patient taking differently: Place 2 sprays into both nostrils daily.)  . gabapentin (NEURONTIN) 300 MG capsule Take 900 mg by mouth 3 (three) times daily.   . hydrochlorothiazide (MICROZIDE) 12.5 MG capsule Take 12.5 mg by mouth daily.  Marland Kitchen HYDROmorphone (DILAUDID) 2 MG tablet Take 2 mg by mouth every 4 (four) hours as needed for severe pain.  . hydrOXYzine (ATARAX/VISTARIL) 25 MG tablet Take 50 mg by mouth every 4 (four) hours as needed for itching. Alternates with benadryl  . ipratropium (ATROVENT) 0.06 % nasal spray Place 2 sprays into both nostrils 3 (three) times daily.  Marland Kitchen ketoconazole (NIZORAL) 2 % shampoo One application once a month as needed for flaking  . ketorolac (TORADOL) 60 MG/2ML SOLN injection Inject 60 mg (2 mL) into the muscle every 6 hours as needed for headache. Max 2 doses (120 mg) in 24 hours. Do not use more than 4 days per month.  . levothyroxine (SYNTHROID, LEVOTHROID) 50 MCG tablet Take 200 mcg by mouth daily. Pt can only take name brand  . Multiple Vitamin (MULTIVITAMIN WITH MINERALS) TABS tablet Take 1 tablet by mouth at bedtime.  . NON FORMULARY 1 mL by Subdermal route every Tuesday. Allergy vaccine  . OnabotulinumtoxinA (BOTOX IJ) Inject as directed every 3 (three) months. For migraines  . potassium chloride (MICRO-K) 10 MEQ CR capsule Take 10 mEq by mouth 2 (two) times daily.  . prochlorperazine (COMPAZINE) 10 MG tablet Take 1 tablet (10 mg total) by mouth every 6 (six) hours as needed for  nausea or vomiting.  . promethazine (PHENERGAN) 25 MG suppository Place 1 suppository (25 mg total) rectally every 6 (six) hours as needed for nausea or vomiting.  . promethazine (PHENERGAN) 25 MG tablet Take 1-2 tablets (25-50 mg total) by mouth 2 (two) times daily as needed for nausea or vomiting.  Marland Kitchen tiZANidine (ZANAFLEX) 4 MG tablet Take 4 mg by mouth 3 (three) times daily.   Marland Kitchen topiramate (TOPAMAX) 25 MG tablet Take 200 mg by mouth 2 (two) times daily.   . vitamin C (ASCORBIC ACID) 500 MG tablet Take 1,000 mg by mouth daily.  Marland Kitchen zolpidem (AMBIEN) 10 MG tablet Take 10 mg by mouth at bedtime.     Allergies:   Cobalt, Contrast media [iodinated diagnostic agents], Latex, Nickel, Peanuts [peanut oil], Red dye, Azithromycin, Chloraprep one step [chlorhexidine gluconate], Corticosteroids, Flexeril [cyclobenzaprine], Morphine and related, Other, Percocet [oxycodone-acetaminophen], Supartz [sodium hyaluronate (avian)], Surgical lubricant, Tape, Trazodone, Yellow dye, Yellow dyes (non-tartrazine), Cephalexin, Monistat [miconazole], Prednisone, Sulfa antibiotics, Tea, and Soap   Social History   Socioeconomic History  . Marital status: Married    Spouse name: Not on file  . Number of children: 1  . Years of education: Not on file  . Highest education level: Not on file  Occupational History  . Not on file  Tobacco Use  . Smoking status: Former Smoker    Types: Cigarettes  . Smokeless tobacco: Never Used  . Tobacco comment: stopped in 2019  Vaping Use  . Vaping Use: Some days  . Substances: Nicotine, Flavoring  Substance and Sexual Activity  . Alcohol use: Not Currently    Alcohol/week: 0.0 standard drinks    Comment: rarely  . Drug use: No  . Sexual activity:  Not on file  Other Topics Concern  . Not on file  Social History Narrative   Lives with husband.     Social Determinants of Health   Financial Resource Strain: Not on file  Food Insecurity: Not on file  Transportation Needs:  Not on file  Physical Activity: Not on file  Stress: Not on file  Social Connections: Not on file     Family History:  The patient's family history includes Anesthesia problems in her mother; Bone cancer in her cousin; Brain cancer in her cousin; Breast cancer in her maternal aunt; Cancer in her maternal aunt; Cervical cancer in her cousin, daughter, maternal aunt, and maternal aunt; Leukemia in her cousin; Leukemia (age of onset: 17) in her mother; Lung cancer in her maternal aunt; Other in her cousin; Ovarian cancer (age of onset: 8) in her mother; Prostate cancer in an other family member; Skin cancer in her maternal grandmother; Throat cancer in her maternal grandfather; Thyroid cancer in her maternal aunt and maternal uncle.   ROS:   Please see the history of present illness.    ROS All other systems reviewed and are negative.  No flowsheet data found.     PHYSICAL EXAM:   VS:  BP 116/70   Pulse (!) 101   Ht 5\' 3"  (1.6 m)   Wt 150 lb (68 kg)   SpO2 95%   BMI 26.57 kg/m    GEN: Well nourished, well developed, in no acute distress  HEENT: normal  Neck: no JVD, carotid bruits, or masses Cardiac: RRR; no murmurs, rubs, or gallops,no edema.  Intact distal pulses bilaterally.  Respiratory:  clear to auscultation bilaterally, normal work of breathing GI: soft, nontender, nondistended, + BS MS: no deformity or atrophy  Skin: warm and dry, no rash Neuro:  Alert and Oriented x 3, Strength and sensation are intact Psych: euthymic mood, full affect  Wt Readings from Last 3 Encounters:  02/05/20 150 lb (68 kg)  08/24/19 135 lb (61.2 kg)  08/28/18 138 lb (62.6 kg)      Studies/Labs Reviewed:   EKG:  EKG is ordered today.  The ekg ordered today demonstrates NSR with nonspecific T wave  abnormality  Recent Labs: 12/24/2019: ALT 27; BUN 13; Creatinine, Ser 0.95; Hemoglobin 12.0; Platelets 303; Potassium 3.8; Sodium 141; TSH 0.022   Lipid Panel    Component Value Date/Time    CHOL 249 (H) 12/24/2019 1405   TRIG 143 12/24/2019 1405   HDL 49 12/24/2019 1405   CHOLHDL 5.1 12/24/2019 1405   VLDL 29 12/24/2019 1405   LDLCALC 171 (H) 12/24/2019 1405       ASSESSMENT:    1. Palpitations   2. Chest pain of uncertain etiology      PLAN:  In order of problems listed above:  1. Palpitations -her symptoms are very vague -she tells me she has a hx of PAF but I cannot find any dx of this any any records -she had an episode of her palpitations and nausea and had to leave the room to go vomit and now is back saying that she has jaw pain and feels like she is still shaking.  -her EKG was done showing NSR with nonspecific T wave abnormality -She has been worked up by Dr. Earnie Larsson with normal echo but refused to wear an event monitor.  2D echo ws normal.  -I have told her to send me any strips she gets on her [facility] regarding palpitations so I can  review them -She has a hx of hyperthyroidism and had to have a thyroidectomy and her last TSH was very suppressed at 0.022 in Nov.  One thing that could tied everything together would be overmedication with Synthroid causing nausea, vomiting and feeling like her entire body is shaking including palpitations -check TSH and full thyroid panel  2.  Chest pain -this is atypical and nonexertional -she had a normal ETT in 2017 and normal echo in 09/2018 -I will check a stat hsTrop -get Stress lexiscan to rule out ischemia and a coronary Ca score to assess risk given fm hx of CAD at early age -her LDL is elevated at 170  3.  HLD -LDL goal < 100 if no CAD -consider addition of statin per PCP    Medication Adjustments/Labs and Tests Ordered: Current medicines are reviewed at length with the patient today.  Concerns regarding medicines are outlined above.  Medication changes, Labs and Tests ordered today are listed in the Patient Instructions below.  There are no Patient Instructions on file for this  visit.   Signed, Fransico Him, MD  02/05/2020 2:17 PM    [facility] [address], [place], [place]  [address] Phone: [phone]; Fax: [phone]

## 2020-02-05 NOTE — Progress Notes (Signed)
Patient is unable to do a stress myoview. Spoke with Dr. Radford Pax and will change orders to exercise tolerance test.

## 2020-02-05 NOTE — Addendum Note (Signed)
Addended by: Antonieta Iba on: 02/05/2020 03:28 PM   Modules accepted: Orders

## 2020-02-06 ENCOUNTER — Telehealth: Payer: Self-pay | Admitting: Cardiology

## 2020-02-06 NOTE — Telephone Encounter (Signed)
Spoke with individual at the infusion center who states that they do no draw labs for patients unless they are there to have their port flushed. Patient is not scheduled to have her port flushed until 02/25/19.

## 2020-02-06 NOTE — Telephone Encounter (Signed)
Someone with the infusion center calling requesting to speak with Kari Green, states they were returning a call. Call was disconnected while on hold to see if Kari Green was available, contact information not obtained prior to call being disconnected.

## 2020-02-06 NOTE — Telephone Encounter (Signed)
I spoke with [facility] at the Patient Kari Green. She states that they do not have any availability for the patient to have labs drawn this week or next.

## 2020-02-06 NOTE — Telephone Encounter (Signed)
Spoke with the patient and advised her that [facility] infusion center will not take appointments for only lab draws. Patient states that in the past locations other than [facility] facilities will not draw labs through her port. She states that if there is somewhere that is able to draw them then she can have them done around 3 today.

## 2020-02-07 NOTE — Telephone Encounter (Signed)
Left message for patient advising her to go to ER to have labs drawn. Advised to call back with any questions.

## 2020-02-07 NOTE — Telephone Encounter (Signed)
She will have to go to the ER to have labs done because I am concerned she may have pancreatitis or may has hyperthyroidism which needs to be addressed ASAP

## 2020-02-07 NOTE — Telephone Encounter (Signed)
Patient calling back.   °

## 2020-02-11 DIAGNOSIS — M26623 Arthralgia of bilateral temporomandibular joint: Secondary | ICD-10-CM | POA: Diagnosis not present

## 2020-02-11 DIAGNOSIS — M7911 Myalgia of mastication muscle: Secondary | ICD-10-CM | POA: Diagnosis not present

## 2020-02-11 DIAGNOSIS — F458 Other somatoform disorders: Secondary | ICD-10-CM | POA: Diagnosis not present

## 2020-02-11 DIAGNOSIS — M2652 Limited mandibular range of motion: Secondary | ICD-10-CM | POA: Diagnosis not present

## 2020-02-12 NOTE — Telephone Encounter (Signed)
Spoke with the patient and she states that she woke up in the middle of the night and her heart was racing. She states it lasted for a short period and went away. She denies shortness of breath, chest pain, or dizziness. She states that she has not had anymore episodes since then. She is feeling okay today. She had surgery on her jaw yesterday. She did not go to the ER to have labs drawn as advised. I advised her on the importance of getting these done. She states that she talked with the infusion center and they stated that they could draw the labs for her if I call over and let them know what labs to draw.  I spoke with the infusion center and they will draw labs - orders are in Epic.

## 2020-02-19 NOTE — Telephone Encounter (Signed)
EKG is normal 

## 2020-02-25 ENCOUNTER — Encounter (HOSPITAL_COMMUNITY): Admission: RE | Admit: 2020-02-25 | Payer: BC Managed Care – PPO | Source: Ambulatory Visit

## 2020-03-03 ENCOUNTER — Other Ambulatory Visit: Payer: Self-pay

## 2020-03-03 ENCOUNTER — Ambulatory Visit (INDEPENDENT_AMBULATORY_CARE_PROVIDER_SITE_OTHER)
Admission: RE | Admit: 2020-03-03 | Discharge: 2020-03-03 | Disposition: A | Discharge status: Home / Self Care | Payer: Self-pay | Source: Ambulatory Visit | Attending: Cardiology | Admitting: Cardiology

## 2020-03-03 DIAGNOSIS — L299 Pruritus, unspecified: Secondary | ICD-10-CM | POA: Diagnosis not present

## 2020-03-03 DIAGNOSIS — R079 Chest pain, unspecified: Secondary | ICD-10-CM

## 2020-03-04 ENCOUNTER — Encounter (HOSPITAL_COMMUNITY)
Admission: RE | Admit: 2020-03-04 | Discharge: 2020-03-04 | Disposition: A | Discharge status: Home / Self Care | Payer: BC Managed Care – PPO | Source: Ambulatory Visit | Attending: Internal Medicine | Admitting: Internal Medicine

## 2020-03-04 DIAGNOSIS — Z95828 Presence of other vascular implants and grafts: Secondary | ICD-10-CM | POA: Insufficient documentation

## 2020-03-04 DIAGNOSIS — Z452 Encounter for adjustment and management of vascular access device: Secondary | ICD-10-CM | POA: Diagnosis not present

## 2020-03-04 LAB — T4, FREE: Free T4: 0.66 ng/dL (ref 0.61–1.12)

## 2020-03-04 LAB — AMYLASE: Amylase: 73 U/L (ref 28–100)

## 2020-03-04 LAB — LIPASE, BLOOD: Lipase: 28 U/L (ref 11–51)

## 2020-03-04 LAB — TSH: TSH: 0.705 u[IU]/mL (ref 0.350–4.500)

## 2020-03-04 MED ORDER — HEPARIN SOD (PORK) LOCK FLUSH 100 UNIT/ML IV SOLN
INTRAVENOUS | Status: AC
  Administered 2020-03-04: 500 [IU]
  Filled 2020-03-04: qty 5

## 2020-03-04 MED ORDER — HEPARIN SOD (PORK) LOCK FLUSH 100 UNIT/ML IV SOLN: 500.0000 [IU] | INTRAVENOUS | Status: DC

## 2020-03-04 MED ORDER — HEPARIN SOD (PORK) LOCK FLUSH 100 UNIT/ML IV SOLN: 500.0000 [IU] | INTRAVENOUS | Status: DC | PRN

## 2020-03-05 ENCOUNTER — Ambulatory Visit
Admission: RE | Admit: 2020-03-05 | Discharge: 2020-03-05 | Disposition: A | Discharge status: Home / Self Care | Payer: BC Managed Care – PPO | Source: Ambulatory Visit | Attending: Pain Medicine | Admitting: Pain Medicine

## 2020-03-05 ENCOUNTER — Encounter: Payer: Self-pay | Admitting: Pain Medicine

## 2020-03-05 ENCOUNTER — Ambulatory Visit (HOSPITAL_BASED_OUTPATIENT_CLINIC_OR_DEPARTMENT_OTHER): Payer: BC Managed Care – PPO | Admitting: Pain Medicine

## 2020-03-05 ENCOUNTER — Other Ambulatory Visit: Payer: Self-pay

## 2020-03-05 VITALS — BP 138/97 | HR 82 | Temp 97.9°F | Ht 63.0 in | Wt 145.0 lb

## 2020-03-05 DIAGNOSIS — Z96651 Presence of right artificial knee joint: Secondary | ICD-10-CM

## 2020-03-05 DIAGNOSIS — T84093S Other mechanical complication of internal left knee prosthesis, sequela: Secondary | ICD-10-CM | POA: Insufficient documentation

## 2020-03-05 DIAGNOSIS — M545 Low back pain, unspecified: Secondary | ICD-10-CM | POA: Diagnosis not present

## 2020-03-05 DIAGNOSIS — M79604 Pain in right leg: Secondary | ICD-10-CM | POA: Insufficient documentation

## 2020-03-05 DIAGNOSIS — M533 Sacrococcygeal disorders, not elsewhere classified: Secondary | ICD-10-CM

## 2020-03-05 DIAGNOSIS — G90521 Complex regional pain syndrome I of right lower limb: Secondary | ICD-10-CM | POA: Insufficient documentation

## 2020-03-05 DIAGNOSIS — M25552 Pain in left hip: Secondary | ICD-10-CM | POA: Insufficient documentation

## 2020-03-05 DIAGNOSIS — M25551 Pain in right hip: Secondary | ICD-10-CM

## 2020-03-05 DIAGNOSIS — I10 Essential (primary) hypertension: Secondary | ICD-10-CM | POA: Insufficient documentation

## 2020-03-05 DIAGNOSIS — K52839 Microscopic colitis, unspecified: Secondary | ICD-10-CM | POA: Insufficient documentation

## 2020-03-05 DIAGNOSIS — M9904 Segmental and somatic dysfunction of sacral region: Secondary | ICD-10-CM

## 2020-03-05 DIAGNOSIS — G8929 Other chronic pain: Secondary | ICD-10-CM

## 2020-03-05 DIAGNOSIS — I700 Atherosclerosis of aorta: Secondary | ICD-10-CM | POA: Insufficient documentation

## 2020-03-05 DIAGNOSIS — M47816 Spondylosis without myelopathy or radiculopathy, lumbar region: Secondary | ICD-10-CM | POA: Insufficient documentation

## 2020-03-05 DIAGNOSIS — G4700 Insomnia, unspecified: Secondary | ICD-10-CM | POA: Insufficient documentation

## 2020-03-05 DIAGNOSIS — M25561 Pain in right knee: Secondary | ICD-10-CM

## 2020-03-05 DIAGNOSIS — M160 Bilateral primary osteoarthritis of hip: Secondary | ICD-10-CM | POA: Diagnosis not present

## 2020-03-05 DIAGNOSIS — E538 Deficiency of other specified B group vitamins: Secondary | ICD-10-CM | POA: Insufficient documentation

## 2020-03-05 DIAGNOSIS — M25519 Pain in unspecified shoulder: Secondary | ICD-10-CM | POA: Insufficient documentation

## 2020-03-05 DIAGNOSIS — F5101 Primary insomnia: Secondary | ICD-10-CM | POA: Insufficient documentation

## 2020-03-05 DIAGNOSIS — F1920 Other psychoactive substance dependence, uncomplicated: Secondary | ICD-10-CM | POA: Insufficient documentation

## 2020-03-05 DIAGNOSIS — R208 Other disturbances of skin sensation: Secondary | ICD-10-CM | POA: Insufficient documentation

## 2020-03-05 DIAGNOSIS — I4891 Unspecified atrial fibrillation: Secondary | ICD-10-CM | POA: Insufficient documentation

## 2020-03-05 DIAGNOSIS — Z79899 Other long term (current) drug therapy: Secondary | ICD-10-CM | POA: Insufficient documentation

## 2020-03-05 DIAGNOSIS — E785 Hyperlipidemia, unspecified: Secondary | ICD-10-CM | POA: Insufficient documentation

## 2020-03-05 DIAGNOSIS — K861 Other chronic pancreatitis: Secondary | ICD-10-CM | POA: Insufficient documentation

## 2020-03-05 DIAGNOSIS — F325 Major depressive disorder, single episode, in full remission: Secondary | ICD-10-CM | POA: Insufficient documentation

## 2020-03-05 LAB — TROPONIN T: Troponin T (Highly Sensitive): <6 ng/L (ref 0–14)

## 2020-03-05 LAB — T3, FREE: T3, Free: 1.9 pg/mL — ABNORMAL LOW (ref 2.0–4.4)

## 2020-03-05 NOTE — Patient Instructions (Signed)
____________________________________________________________________________________________  Preparing for Procedure with Sedation  Procedure appointments are limited to planned procedures: . No Prescription Refills. . No disability issues will be discussed. . No medication changes will be discussed.  Instructions: . Oral Intake: Do not eat or drink anything for at least 8 hours prior to your procedure. (Exception: Blood Pressure Medication. See below.) . Transportation: Unless otherwise stated by your physician, you may drive yourself after the procedure. . Blood Pressure Medicine: Do not forget to take your blood pressure medicine with a sip of water the morning of the procedure. If your Diastolic (lower reading)is above 100 mmHg, elective cases will be cancelled/rescheduled. . Blood thinners: These will need to be stopped for procedures. Notify our staff if you are taking any blood thinners. Depending on which one you take, there will be specific instructions on how and when to stop it. . Diabetics on insulin: Notify the staff so that you can be scheduled 1st case in the morning. If your diabetes requires high dose insulin, take only  of your normal insulin dose the morning of the procedure and notify the staff that you have done so. . Preventing infections: Shower with an antibacterial soap the morning of your procedure. . Build-up your immune system: Take 1000 mg of Vitamin C with every meal (3 times a day) the day prior to your procedure. . Antibiotics: Inform the staff if you have a condition or reason that requires you to take antibiotics before dental procedures. . Pregnancy: If you are pregnant, call and cancel the procedure. . Sickness: If you have a cold, fever, or any active infections, call and cancel the procedure. . Arrival: You must be in the facility at least 30 minutes prior to your scheduled procedure. . Children: Do not bring children with you. . Dress appropriately:  Bring dark clothing that you would not mind if they get stained. . Valuables: Do not bring any jewelry or valuables.  Reasons to call and reschedule or cancel your procedure: (Following these recommendations will minimize the risk of a serious complication.) . Surgeries: Avoid having procedures within 2 weeks of any surgery. (Avoid for 2 weeks before or after any surgery). . Flu Shots: Avoid having procedures within 2 weeks of a flu shots or . (Avoid for 2 weeks before or after immunizations). . Barium: Avoid having a procedure within 7-10 days after having had a radiological study involving the use of radiological contrast. (Myelograms, Barium swallow or enema study). . Heart attacks: Avoid any elective procedures or surgeries for the initial 6 months after a "Myocardial Infarction" (Heart Attack). . Blood thinners: It is imperative that you stop these medications before procedures. Let us know if you if you take any blood thinner.  . Infection: Avoid procedures during or within two weeks of an infection (including chest colds or gastrointestinal problems). Symptoms associated with infections include: Localized redness, fever, chills, night sweats or profuse sweating, burning sensation when voiding, cough, congestion, stuffiness, runny nose, sore throat, diarrhea, nausea, vomiting, cold or Flu symptoms, recent or current infections. It is specially important if the infection is over the area that we intend to treat. . Heart and lung problems: Symptoms that may suggest an active cardiopulmonary problem include: cough, chest pain, breathing difficulties or shortness of breath, dizziness, ankle swelling, uncontrolled high or unusually low blood pressure, and/or palpitations. If you are experiencing any of these symptoms, cancel your procedure and contact your primary care physician for an evaluation.  Remember:  Regular Business hours are:    Monday to Thursday 8:00 AM to 4:00 PM  Provider's  Schedule: Viviene Thurston, MD:  Procedure days: Tuesday and Thursday 7:30 AM to 4:00 PM  Bilal Lateef, MD:  Procedure days: Monday and Wednesday 7:30 AM to 4:00 PM ____________________________________________________________________________________________   ____________________________________________________________________________________________  General Risks and Possible Complications  Patient Responsibilities: It is important that you read this as it is part of your informed consent. It is our duty to inform you of the risks and possible complications associated with treatments offered to you. It is your responsibility as a patient to read this and to ask questions about anything that is not clear or that you believe was not covered in this document.  Patient's Rights: You have the right to refuse treatment. You also have the right to change your mind, even after initially having agreed to have the treatment done. However, under this last option, if you wait until the last second to change your mind, you may be charged for the materials used up to that point.  Introduction: Medicine is not an exact science. Everything in Medicine, including the lack of treatment(s), carries the potential for danger, harm, or loss (which is by definition: Risk). In Medicine, a complication is a secondary problem, condition, or disease that can aggravate an already existing one. All treatments carry the risk of possible complications. The fact that a side effects or complications occurs, does not imply that the treatment was conducted incorrectly. It must be clearly understood that these can happen even when everything is done following the highest safety standards.  No treatment: You can choose not to proceed with the proposed treatment alternative. The "PRO(s)" would include: avoiding the risk of complications associated with the therapy. The "CON(s)" would include: not getting any of the treatment  benefits. These benefits fall under one of three categories: diagnostic; therapeutic; and/or palliative. Diagnostic benefits include: getting information which can ultimately lead to improvement of the disease or symptom(s). Therapeutic benefits are those associated with the successful treatment of the disease. Finally, palliative benefits are those related to the decrease of the primary symptoms, without necessarily curing the condition (example: decreasing the pain from a flare-up of a chronic condition, such as incurable terminal cancer).  General Risks and Complications: These are associated to most interventional treatments. They can occur alone, or in combination. They fall under one of the following six (6) categories: no benefit or worsening of symptoms; bleeding; infection; nerve damage; allergic reactions; and/or death. 1. No benefits or worsening of symptoms: In Medicine there are no guarantees, only probabilities. No healthcare provider can ever guarantee that a medical treatment will work, they can only state the probability that it may. Furthermore, there is always the possibility that the condition may worsen, either directly, or indirectly, as a consequence of the treatment. 2. Bleeding: This is more common if the patient is taking a blood thinner, either prescription or over the counter (example: Goody Powders, Fish oil, Aspirin, Garlic, etc.), or if suffering a condition associated with impaired coagulation (example: Hemophilia, cirrhosis of the liver, low platelet counts, etc.). However, even if you do not have one on these, it can still happen. If you have any of these conditions, or take one of these drugs, make sure to notify your treating physician. 3. Infection: This is more common in patients with a compromised immune system, either due to disease (example: diabetes, cancer, human immunodeficiency virus [HIV], etc.), or due to medications or treatments (example: therapies used to treat  cancer and   rheumatological diseases). However, even if you do not have one on these, it can still happen. If you have any of these conditions, or take one of these drugs, make sure to notify your treating physician. 4. Nerve Damage: This is more common when the treatment is an invasive one, but it can also happen with the use of medications, such as those used in the treatment of cancer. The damage can occur to small secondary nerves, or to large primary ones, such as those in the spinal cord and brain. This damage may be temporary or permanent and it may lead to impairments that can range from temporary numbness to permanent paralysis and/or brain death. 5. Allergic Reactions: Any time a substance or material comes in contact with our body, there is the possibility of an allergic reaction. These can range from a mild skin rash (contact dermatitis) to a severe systemic reaction (anaphylactic reaction), which can result in death. 6. Death: In general, any medical intervention can result in death, most of the time due to an unforeseen complication. ____________________________________________________________________________________________   

## 2020-03-05 NOTE — Progress Notes (Signed)
Safety precautions to be maintained throughout the outpatient stay will include: orient to surroundings, keep bed in low position, maintain call bell within reach at all times, provide assistance with transfer out of bed and ambulation.  

## 2020-03-05 NOTE — Progress Notes (Signed)
PROVIDER NOTE: Information contained herein reflects review and annotations entered in association with encounter. Interpretation of such information and data should be left to medically-trained personnel. Information provided to patient can be located elsewhere in the medical record under "Patient Instructions". Document created using STT-dictation technology, any transcriptional errors that may result from process are unintentional.    Patient: Kari Green  Service Category: E/M  Provider: Gaspar Cola, MD  DOB: 22-Nov-1965  DOS: 03/05/2020  Specialty: Interventional Pain Management  MRN: [id number]  Setting: Ambulatory outpatient  PCP: Jani Gravel, MD  Type: Established Patient    Referring Provider: Jani Gravel, MD  Location: Office  Delivery: Face-to-face     HPI  Kari Green, a 55 y.o. year old female, is here today because of her Chronic pain of right lower extremity [M79.604, G89.29]. Kari Green primary complain today is Knee Pain (right) Last encounter: My last encounter with her was on Visit date not found. Pertinent problems: Kari Green has Abdominal pain, epigastric; Abdominal adhesions; Chronic low back pain (Bilateral) w/o sciatica; CRPS (complex regional pain syndrome) type I of lower limb (Right); History of arthroplasty of knee (Right); Chronic pain syndrome; Chronic shoulder pain (Left); Chronic knee pain (2ry area of Pain) (Right); Chronic lower extremity pain (1ry area of Pain) (Right); Temporomandibular joint disorder; Fibromyalgia; Muscle cramps at night; Radiculopathy; S/P shoulder hemiarthroplasty (Left); Failed total knee, left ([place]); Chronic knee pain after total replacement of knee joint (Right); Migraine without aura, not refractory; Cancer ([place]); Avascular necrosis of bone ([place]); Burn injury; 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 Pain Assessment: Severity of Chronic pain is reported as a 8 /10. Location: Knee Right/Denies. Onset: More than a month ago. Quality: Burning,Aching. Timing: Constant. Modifying factor(s): procedure. Vitals:  height is 5' 3" (1.6 m) and weight is 145 lb (65.8 kg). Her temperature is 97.9 F (36.6 C). Her blood pressure is 138/97 (abnormal) and her pulse is 82. Her oxygen saturation is 97%.   Reason for encounter: post-procedure assessment.  The patient returns to the clinic today after having had a palliative right-sided genicular nerve radiofrequency ablation #3 done under fluoroscopic guidance and IV sedation on 10/25/2017.  The patient indicated that this last procedure provided her with 100% relief of the pain for 2 years.  She was extremely happy about this and today she returns hoping that we can repeat that procedure and get similar results.  Today we have talked to her about the possibility that this may not be possible, but that we will try.  He indicated understanding that not all of the radiofrequencies end up providing the same type of relief or for the same duration.  She indicates being okay with this.  In addition to this, the patient has indicated that for many years she has been getting SI joint injections done every 3 months at her pain physicians practicing [place].  However, lately they have not been able to get IV access and she indicates that she is not able to do the procedure without sedation.  She does have a central venous port, but according to her they cannot access that at their practice.  She comes in today wanting to know if we can begin to do her SI joint injections since  we do have the capability here to access the central port.  I told the patient that it would  not be a problem.  However, I asked her to continue with her medication management with her pain physician in [place] so that we could concentrate on the interventional portion of her care.  She indicated that this would be no problem.  In assessing this SI joint pain, but she indicates that she has had this injections in the past but she was a little confused and was not really clear as to whether or not she was getting just the SI joint injections or radiofrequency ablation.  After I explained the difference she indicated that she thought that she was getting the radiofrequency ablation but that she would occasionally get good results lasting up to 6 months, but sometimes she would not.  She also indicated being okay with this.  And try to evaluate this SI joint pain, today I have done a provocative physical exam and the patient actually was positive bilaterally for lumbar facet arthralgia on hyperextension on rotation maneuver and Kemp maneuver.  In addition, she was also positive bilaterally for hip joint and SI joint arthralgias on Patrick maneuver.  This would actually explain the reason why she occasionally gets good relief with the treatments of the SI joint and sometimes she does not.   Today I went on to review her imaging and we will have any recent x-rays of her SI joint, hip joints, or lumbar spine and therefore today I will be ordering those.  I will also be scheduling the patient to return for a repeat right-sided radiofrequency ablation of the genicular nerves.  The above plan was shared with the patient who understood and agreed.  Post-Procedure Evaluation  Procedure (10/25/2017): Palliative right genicular nerve RFA #3 under fluoroscopic guidance and IV sedation Pre-procedure pain level: 9/10 Post-procedure: 0/10 (100% relief)  Sedation: Sedation provided.  Effectiveness during initial hour after procedure(Ultra-Short Term Relief): 100 %.  Local anesthetic used: Long-acting (4-6  hours) Effectiveness: Defined as any analgesic benefit obtained secondary to the administration of local anesthetics. This carries significant diagnostic value as to the etiological location, or anatomical origin, of the pain. Duration of benefit is expected to coincide with the duration of the local anesthetic used.  Effectiveness during initial 4-6 hours after procedure(Short-Term Relief): 100 %.  Long-term benefit: Defined as any relief past the pharmacologic duration of the local anesthetics.  Effectiveness past the initial 6 hours after procedure(Long-Term Relief): 100 % (lasted over 2 years).  Current benefits: Defined as benefit that persist at this time.   Analgesia:  100% relief of the pain x2 years.  Unfortunately, the pain has began to come back. Function: Kari Green reports improvement in function ROM: Kari Green reports improvement in ROM  Statement of Medical Necessity:  Kari Green continues to experienced debilitating chronic nerve-associated pain from the Right knee.  Duration: This pain has persisted for longer than three months.  Non-surgical care: The patient has either failed to respond, or was unable to tolerate, or simply did not get enough benefit from other more conservative therapies including, but not limited to: 1. Over-the-counter oral analgesic medications (i.e.: ibuprofen, naproxen, etc.) 2. Anti-inflammatory medications 3. Muscle relaxants 4. Membrane stabilizers 5. Opioids 6. Physical therapy (PT), chiropractic manipulation, and/or home exercise program (HEP). 7. Modalities (Heat, ice, etc.)  Invasive therapies: Prior radiofrequency procedures have proven to be effective in providing long-term (>6 months) benefit.  (2 years of 100% relief  with the last one)  Surgical care: Failed to provide any significant benefit.  Physical exam: Has been consistent with knee pain status post replacement..  Diagnostic imaging: Demonstrates no apparent  problems with the hardware.                Diagnostic interventional therapies: Kari Green has attained greater than 50% reduction in pain from diagnostic injections conducted in separate occasions.  In addition, the diagnostic injections were followed by radiofrequency ablation that by now has been repeated 3 times, the last of which provided her with 2 full years of 100% relief of the pain.  For the above listed reason, I believe, as the examining and treating physician, that it is medically necessary to proceed with Non-Pulsed Radiofrequency Ablation for the purpose of attempting to prolong the duration of the benefits seen with the diagnostic injections.  Pharmacotherapy Assessment   Analgesic: No opioid analgesics prescribed by our practice.  The last time that we checked, the patient was getting her pain medications from the [facility].   Monitoring: [place] PMP: PDMP reviewed during this encounter.       Pharmacotherapy: No side-effects or adverse reactions reported. Compliance: No problems identified. Effectiveness: Clinically acceptable.  Chauncey Fischer, RN  03/05/2020  1:35 PM  Sign when Signing Visit Safety precautions to be maintained throughout the outpatient stay will include: orient to surroundings, keep bed in low position, maintain call bell within reach at all times, provide assistance with transfer out of bed and ambulation.     UDS: No results found for: SUMMARY   ROS  Constitutional: Denies any fever or chills Gastrointestinal: No reported hemesis, hematochezia, vomiting, or acute GI distress Musculoskeletal: Denies any acute onset joint swelling, redness, loss of ROM, or weakness Neurological: No reported episodes of acute onset apraxia, aphasia, dysarthria, agnosia, amnesia, paralysis, loss of coordination, or loss of consciousness  Medication Review  ALPRAZolam, HYDROmorphone, Lemborexant, NON FORMULARY, Naloxone HCl, OnabotulinumtoxinA, botulinum toxin Type  A, cyanocobalamin, diclofenac sodium, diphenhydrAMINE, estradiol, fluticasone, gabapentin, hydrOXYzine, hydrochlorothiazide, ipratropium, ketoconazole, ketorolac, levothyroxine, multivitamin with minerals, potassium chloride, prochlorperazine, promethazine, tiZANidine, topiramate, and vitamin C  History Review  Allergy: Kari Green is allergic to cobalt, contrast media [iodinated diagnostic agents], latex, nickel, peanuts [peanut oil], red dye, azithromycin, chloraprep one step [chlorhexidine gluconate], corticosteroids, flexeril [cyclobenzaprine], morphine and related, other, percocet [oxycodone-acetaminophen], supartz [sodium hyaluronate (avian)], surgical lubricant, tape, trazodone, yellow dye, yellow dyes (non-tartrazine), cephalexin, monistat [miconazole], prednisone, sulfa antibiotics, tea, and soap. Drug: Kari Green  reports no history of drug use. Alcohol:  reports previous alcohol use. Tobacco:  reports that she has quit smoking. Her smoking use included cigarettes. She has never used smokeless tobacco. Social: Kari Green  reports that she has quit smoking. Her smoking use included cigarettes. She has never used smokeless tobacco. She reports previous alcohol use. She reports that she does not use drugs. Medical:  has a past medical history of Anemia, Anxiety, Arthritis, Asthma, Avascular necrosis ([place]), Basal cell carcinoma, Chronic migraine without aura, intractable, with status migrainosus, Chronic, continuous use of opioids, Complication of anesthesia, Degenerative disc disease, cervical, Degenerative disc disease, lumbar, Delayed gastric emptying, Difficult intravenous access, Difficulty swallowing pills, Enlarged lymph node in neck, Esophageal dysmotility, Family history of adverse reaction to anesthesia, Family history of breast cancer (08/29/2017), Family history of leukemia (08/29/2017), Family history of ovarian cancer (08/29/2017), Family history of prostate cancer (08/29/2017), Family  history of thyroid cancer (08/29/2017), Fatty liver, Fibromyalgia, H/O blood transfusion reaction, Hemangioma of liver,  Hemangioma, renal, Hiatal hernia, History of exercise stress test (05/08/2015), History of kidney stones (04/2014), History of skin cancer, Hypertension, IC (interstitial cystitis), Irritable bowel syndrome (IBS), Knee pain, right, Limited joint range of motion, Melanoma ([facility]), Memory impairment, Osteoporosis, Pain syndrome, chronic, Pneumonia, PONV (postoperative nausea and vomiting), Port-A-Cath in place (11/04/2016), Post-surgical hypothyroidism, Sciatica, Seasonal allergies, and Squamous carcinoma. Surgical: Kari Green  has a past surgical history that includes Total thyroidectomy (1993); cysto with hydrodistension (01/29/2011); Cystoscopy w/ retrogrades (01/29/2011); Fissurectomy (~ 1988 x2); Refractive surgery (Bilateral, ~ 1999; ~ 2013); Nasal sinus surgery (2013); Laparoscopic lysis of adhesions (02/11/2012); Excision haglund's deformity with achilles tendon repair (Bilateral, 2013-2014); Melanoma excision (Left, 2013); Cystoscopy with stent placement (2006); Appendectomy (~ 1992); Tonsillectomy and adenoidectomy (~ 1989); LAPAROSCOPY ADHESIOLYSIS AND REMOVAL POSSIBLE RIGHT OVARY REMNANT (04/18/2000); Knee arthroscopy (Right, 08/16/2000); Carpal tunnel release (Bilateral, right 07/26/2001;  left ?); DEBRIDEMENT RIGHT THUMB MP JOINT (Right, 04/10/2002); RIGHT THUMB FUSION OF MPJ (Right, 10/03/2002); DECOMPRESSION LEFT ULNAR NERVE, ELBOW (Left, 01/29/2003); RIGHT TOTAL KNEE REVISION ARTHROPLASTY (Right, 03/14/2007); CYSTO/ URETHRAL DILATION/ HYDRODISTENTION/ BLADDER BX (04/10/2003); Laparoscopic assisted vaginal hysterectomy (Feb 1999); LAPAROSCOPY LYSIS ADHESIONS/  BILATERAL SALPINGOOPHORECTOMY/  FULGERATION OF ENDOMETRIOSIS (1998); Anterior cervical decomp/discectomy fusion (03/10/2009); Esophagogastroduodenoscopy (egd) with esophageal dilation (10-26-2000); CYSTO/  BLADDER BX (03/08/2008);  transthoracic echocardiogram (09/22/2012); cysto with hydrodistension (N/A, 05/08/2014); laparoscopy (02/11/2012); Transanal hemorrhoidal dearterialization (03/26/2011); Knee arthroscopy with fulkerson slide (Right, 11/29/2000); Laparoscopic cholecystectomy (03/04/2002); Total knee arthroplasty (Right, 09/10/2003; 12/08/2012); Carpometacarpel [facility]) suspension plasty (Right, 02/10/2015); Ganglion cyst excision (Right, 02/10/2015); NEGATIVE SLEEP STUDY (2006 approx .  per pt); cysto with hydrodistension (N/A, 03/27/2015); Kidney stone surgery; Anterior cervical decomp/discectomy fusion (N/A, 07/16/2015); Video bronchoscopy (Bilateral, 10/20/2015); Shoulder hemi-arthroplasty (Left, 12/04/2015); EUS (N/A, 04/23/2016); Trigger finger release (Left, 07/06/2016); IR FLUORO GUIDE PORT INSERTION RIGHT (11/04/2016); IR US Guide Vasc Access Right (11/04/2016); Shoulder arthroscopy (Left); Augmentation mammaplasty (Bilateral, 1996; 2001, 2016); Breast surgery (age 22); Cystoscopy with hydrodistension and biopsy (N/A, 06/29/2017); sympathetic nerve ablation (07/05/2017); and Lymph node biopsy (Left, 07/24/2018). Family: family history includes Anesthesia problems in her mother; Bone cancer in her cousin; Brain cancer in her cousin; Breast cancer in her maternal aunt; Cancer in her maternal aunt; Cervical cancer in her cousin, daughter, maternal aunt, and maternal aunt; Leukemia in her cousin; Leukemia (age of onset: 16) in her mother; Lung cancer in her maternal aunt; Other in her cousin; Ovarian cancer (age of onset: 50) in her mother; Prostate cancer in an other family member; Skin cancer in her maternal grandmother; Throat cancer in her maternal grandfather; Thyroid cancer in her maternal aunt and maternal uncle.  Laboratory Chemistry Profile   Renal Lab Results  Component Value Date   BUN 13 12/24/2019   CREATININE 0.95 12/24/2019   GFRAA >60 03/23/2019   GFRNONAA >60 12/24/2019     Hepatic Lab Results  Component Value Date   AST  30 12/24/2019   ALT 27 12/24/2019   ALBUMIN 3.3 (L) 12/24/2019   ALKPHOS 55 12/24/2019   AMYLASE 73 03/04/2020   LIPASE 28 03/04/2020     Electrolytes Lab Results  Component Value Date   NA 141 12/24/2019   K 3.8 12/24/2019   CL 112 (H) 12/24/2019   CALCIUM 8.8 (L) 12/24/2019     Bone No results found for: VD25OH, VD125OH2TOT, YQ8250IB7, CW8889VQ9, 25OHVITD1, 25OHVITD2, 25OHVITD3, TESTOFREE, TESTOSTERONE   Inflammation (CRP: Acute Phase) (ESR: Chronic Phase) Lab Results  Component Value Date   ESRSEDRATE 15 09/14/2018   LATICACIDVEN 1.75 09/08/2013  Note: Above Lab results reviewed.  Recent Imaging Review  CT CARDIAC SCORING Addendum: ADDENDUM REPORT: 03/04/2020 08:36   CLINICAL DATA:  Risk stratification   EXAM:  Coronary Calcium Score   TECHNIQUE:  The patient was scanned on a [employer] scanner. Axial  non-contrast 3 mm slices were carried out through the heart. The  data set was analyzed on a dedicated work station and scored using  the [place].   FINDINGS:  Non-cardiac: See separate report from [facility].   Ascending Aorta: Normal Caliber.  No Calcifications.   Pericardium: Normal   Coronary arteries: Normal coronary origins.   IMPRESSION:  Coronary calcium score of 203. This was 98th percentile for age and  sex matched control.   Catheter noted in SVC.   Fransico Him   Electronically Signed    By: Fransico Him    On: 03/04/2020 08:36 Narrative: Jasmine December: OVER-READ INTERPRETATION  CT CHEST  The following report is an over-read performed by radiologist Dr. Vinnie Langton of [facility], [place] on 03/03/2020. This over-read does not include interpretation of cardiac or coronary anatomy or pathology. The coronary calcium score interpretation by the cardiologist is attached.  COMPARISON:  None.  FINDINGS: Central venous catheter tip terminating at the superior cavoatrial junction. Within the visualized portions  of the thorax there are no suspicious appearing pulmonary nodules or masses, there is no acute consolidative airspace disease, no pleural effusions, no pneumothorax and no lymphadenopathy. Visualized portions of the upper abdomen are unremarkable. There are no aggressive appearing lytic or blastic lesions noted in the visualized portions of the skeleton. Bilateral breast implants are incidentally noted.  IMPRESSION: 1. No significant incidental noncardiac findings are noted.  Electronically Signed: By: Vinnie Langton M.D. On: 03/03/2020 16:25 Note: Reviewed        Physical Exam  General appearance: Well nourished, well developed, and well hydrated. In no apparent acute distress Mental status: Alert, oriented x 3 (person, place, & time)       Respiratory: No evidence of acute respiratory distress Eyes: PERLA Vitals: BP (!) 138/97   Pulse 82   Temp 97.9 F (36.6 C)   Ht 5' 3" (1.6 m)   Wt 145 lb (65.8 kg)   SpO2 97%   BMI 25.69 kg/m  BMI: Estimated body mass index is 25.69 kg/m as calculated from the following:   Height as of this encounter: 5' 3" (1.6 m).   Weight as of this encounter: 145 lb (65.8 kg). Ideal: Ideal body weight: 52.4 kg (115 lb 8.3 oz) Adjusted ideal body weight: 57.7 kg (127 lb 5 oz)  Assessment   Status Diagnosis  Controlled Controlled Controlled 1. Chronic lower extremity pain (1ry area of Pain) (Right)   2. Chronic knee pain (2ry area of Pain) (Right)   3. 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 CRPS (complex regional pain syndrome) type I of lower limb (Right)   5. Failed total left knee replacement, sequela   6. History of arthroplasty of knee (Right)   7. History of revision of total knee replacement (Right)   8. Chronic hip pain (Bilateral)   9. Somatic dysfunction of sacroiliac joints (Bilateral)   10. Chronic sacroiliac pain (Bilateral)   11. Chronic low back pain (Bilateral) w/o sciatica   12. Lumbar facet  joint syndrome (Bilateral)      Updated Problems: Problem  Chronic hip pain (Bilateral)  Somatic dysfunction of sacroiliac joints (Bilateral)  Chronic sacroiliac pain (  Bilateral)  Lumbar facet joint syndrome (Bilateral)  Cancer ([facility])   history of skin cancer history of skin cancer  Formatting of this note might be different from the original. Overview:  history of skin cancer   Right Flank Pain   Added automatically from request for surgery [phone]  Formatting of this note might be different from the original. Added automatically from request for surgery [id number]   Migraine Without Aura, Not Refractory  Chronic low back pain (Bilateral) w/o sciatica  Chronic knee pain (2ry area of Pain) (Right)   Good response to diagnostic right-sided genicular nerve block on 06/05/2014, followed by a right knee genicular radiofrequency ablation on 07/02/2014.   Chronic lower extremity pain (1ry area of Pain) (Right)  Gross Hematuria   Added automatically from request for surgery [id number]  Formatting of this note might be different from the original. Added automatically from request for surgery [phone]   History of Kidney Stones   Added automatically from request for surgery [phone]  Formatting of this note might be different from the original. Added automatically from request for surgery [id number]   Tobacco User  Interstitial Cystitis (Chronic) With Hematuria   Added automatically from request for surgery [phone]  Formatting of this note might be different from the original. Added automatically from request for surgery [id number]   Genetic Testing   The Multi-Cancer Panel offered by [facility] includes sequencing and/or deletion duplication testing of the following 83 genes: ALK, APC, ATM, AXIN2,BAP1,  BARD1, BLM, BMPR1A, BRCA1, BRCA2, BRIP1, CASR, CDC73, CDH1, CDK4, CDKN1B, CDKN1C, CDKN2A (p14ARF), CDKN2A (p16INK4a), CEBPA, CHEK2, CTNNA1, DICER1, DIS3L2, EGFR (c.2369C>T, p.Thr790Met variant only),  EPCAM (Deletion/duplication testing only), FH, FLCN, GATA2, GPC3, GREM1 (Promoter region deletion/duplication testing only), HOXB13 (c.251G>A, p.Gly84Glu), HRAS, KIT, MAX, MEN1, MET, MITF (c.952G>A, p.Glu318Lys variant only), MLH1, MSH2, MSH3, MSH6, MUTYH, NBN, NF1, NF2, NTHL1, PALB2, PDGFRA, PHOX2B, PMS2, POLD1, POLE, POT1, PRKAR1A, PTCH1, PTEN, RAD50, RAD51C, RAD51D, RB1, RECQL4, RET, RUNX1, SDHAF2, SDHA (sequence changes only), SDHB, SDHC, SDHD, SMAD4, SMARCA4, SMARCB1, SMARCE1, STK11, SUFU, TERC, TERT, TMEM127, TP53, TSC1, TSC2, VHL, WRN and WT1.   Results: No pathogenic variants were identified.  2 variants of uncertain significance were identified in the genes BRIP1 c.1595T>C (p.Met532Thr) and PALB2 c.2881C>A (p.Leu961Met).  The date of this test report is 09/06/2017.   Formatting of this note might be different from the original. Formatting of this note might be different from the original. The Multi-Cancer Panel offered by [facility] includes sequencing and/or deletion duplication testing of the following 83 genes: ALK, APC, ATM, AXIN2,BAP1,  BARD1, BLM, BMPR1A, BRCA1, BRCA2, BRIP1, CASR, CDC73, CDH1, CDK4, CDKN1B, CDKN1C, CDKN2A (p14ARF), CDKN2A (p16INK4a), CEBPA, CHEK2, CTNNA1, DICER1, DIS3L2, EGFR (c.2369C>T, p.Thr790Met variant only), EPCAM (Deletion/duplication testing only), FH, FLCN, GATA2, GPC3, GREM1 (Promoter region deletion/duplication testing only), HOXB13 (c.251G>A, p.Gly84Glu), HRAS, KIT, MAX, MEN1, MET, MITF (c.952G>A, p.Glu318Lys variant only), MLH1, MSH2, MSH3, MSH6, MUTYH, NBN, NF1, NF2, NTHL1, PALB2, PDGFRA, PHOX2B, PMS2, POLD1, POLE, POT1, PRKAR1A, PTCH1, PTEN, RAD50, RAD51C, RAD51D, RB1, RECQL4, RET, RUNX1, SDHAF2, SDHA (sequence changes only), SDHB, SDHC, SDHD, SMAD4, SMARCA4, SMARCB1, SMARCE1, STK11, SUFU, TERC, TERT, TMEM127, TP53, TSC1, TSC2, VHL, WRN and WT1.   Results: No pathogenic variants were identified.  2 variants of uncertain significance were identified in the genes  BRIP1 c.1595T>C (p.Met532Thr) and PALB2 c.2881C>A (p.Leu961Met).  The date of this test report is 09/06/2017.   Family History of Breast Cancer   Last Assessment & Plan:  Patient had 47 and me test apparently showing BRCA1 mutation.  We performed complete genetic testing and did not identify any BRCA mutations. Tyra Cusick version 8 revealed that she has a lifetime risk of 7%.  Which is fairly similar to the average population risk.  Based on these findings I did not recommend high risk breast cancer surveillance with MRIs.  I also did not recommend bilateral mastectomies for breast reduction.  Return to clinic on an as-needed basis.  Formatting of this note might be different from the original. Last Assessment & Plan:  Patient had 23 and me test apparently showing BRCA1 mutation. We performed complete genetic testing and did not identify any BRCA mutations. Tyra Cusick version 8 revealed that she has a lifetime risk of 7%.  Which is fairly similar to the average population risk.  Based on these findings I did not recommend high risk breast cancer surveillance with MRIs.  I also did not recommend bilateral mastectomies for breast reduction.  Return to clinic on an as-needed basis.   Hemoptysis   Formatting of this note might be different from the original. Last Assessment & Plan:  Formatting of this note might be different from the original. No evidence of an endobronchial lesion on bronchoscopy. I believe that this was likely from her nasopharynx.   Cough   Formatting of this note might be different from the original. Last Assessment & Plan:  Formatting of this note might be different from the original. Being driven by her upper airway irritation. She does not have overt GERD but she does have frequent regurgitation due to poor peristalsis and dysphasia. Suspect that this is a large contributor. The cough and UA obstruction are secondary   Cervical Lymphadenopathy  Asthma    Overview:  Last Assessment & Plan:   Depo-Medrol 80 mg was administered in the office.  A sample and prescription have been provided for Dulera 200/5 g, 2 inhalations twice a day.  To maximize pulmonary deposition, a spacer has been provided along with instructions for its proper administration with an HFA inhaler.   A prescription has been provided for tessilon pearls 200 mg every 6 hours as needed.  A portable nebulizer has been provided.   The patient has been asked to contact me if her symptoms persist, progress, or if she becomes febrile. Otherwise, she may return for follow up in 4 months.  Last Assessment & Plan:   Depo-Medrol 80 mg was administered in the office.  A sample and prescription have been provided for Dulera 200/5 g, 2 inhalations twice a day.  To maximize pulmonary deposition, a spacer has been provided along with instructions for its proper administration with an HFA inhaler.   A prescription has been provided for tessilon pearls 200 mg every 6 hours as needed.  A portable nebulizer has been provided.   The patient has been asked to contact me if her symptoms persist, progress, or if she becomes febrile. Otherwise, she may return for follow up in 4 months.  Overview:  Overview:  Last Assessment & Plan:   Depo-Medrol 80 mg was administered in the office.  A sample and prescription have been provided for Dulera 200/5 g, 2 inhalations twice a day.  To maximize pulmonary deposition, a spacer has been provided along with instructions for its proper administration with an HFA inhaler.   A prescription has been provided for tessilon pearls 200 mg every 6 hours as needed.  A portable nebulizer has been provided.   The patient has been asked to contact me if her symptoms  persist, progress, or if she becomes febrile. Otherwise, she may return for follow up in 4 months.  Formatting of this note might be different from the original. Last Assessment & Plan:    Depo-Medrol 80 mg was administered in the office.  A sample and prescription have been provided for Dulera 200/5 g, 2 inhalations twice a day.  To maximize pulmonary deposition, a spacer has been provided along with instructions for its proper administration with an HFA inhaler.   A prescription has been provided for tessilon pearls 200 mg every 6 hours as needed.  A portable nebulizer has been provided.   The patient has been asked to contact me if her symptoms persist, progress, or if she becomes febrile. Otherwise, she may return for follow up in 4 months.  Overview:  Overview:  Last Assessment & Plan:   Depo-Medrol 80 mg was administered in the office.  A sample and prescription have been provided for Dulera 200/5 g, 2 inhalations twice a day.  To maximize pulmonary deposition, a spacer has been provided along with instructions for its proper administration with an HFA inhaler.   A prescription has been provided for tessilon pearls 200 mg every 6 hours as needed.  A portable nebulizer has been provided.   The patient has been asked to contact me if her symptoms persist, progress, or if she becomes febrile. Otherwise, she may return for follow up in 4 months.   Anxiety Disorder  Allergic Rhinitis   Last Assessment & Plan:   Continue allergen avoidance measures and fluticasone nasal spray as needed.  She may resume aeroallergen immunotherapy when the asthma exacerbation has resolved.  Last Assessment & Plan:   Continue appropriate allergen avoidance measures.  A prescription has been provided for azelastine nasal spray, 1-2 sprays per nostril 2 times daily as needed. Proper nasal spray technique has been discussed and demonstrated.   Nasal saline lavage (NeilMed) has been recommended as needed and prior to medicated nasal sprays along with instructions for proper administration.  For thick post nasal drainage, add guaifenesin [phone] mg (Mucinex)  twice daily as  needed with adequate hydration as discussed.  Will plan to restart immunotherapy in the near future.  Formatting of this note might be different from the original. Last Assessment & Plan:   Continue allergen avoidance measures and fluticasone nasal spray as needed.  She may resume aeroallergen immunotherapy when the asthma exacerbation has resolved.  Last Assessment & Plan:   Continue appropriate allergen avoidance measures.  A prescription has been provided for azelastine nasal spray, 1-2 sprays per nostril 2 times daily as needed. Proper nasal spray technique has been discussed and demonstrated.   Nasal saline lavage (NeilMed) has been recommended as needed and prior to medicated nasal sprays along with instructions for proper administration.  For thick post nasal drainage, add guaifenesin [phone] mg (Mucinex)  twice daily as needed with adequate hydration as discussed.  Will plan to restart immunotherapy in the near future.   Chronic Obstructive Pulmonary Disease, Unspecified (Hcc)  Irritable Bowel Syndrome With Diarrhea  Acute Insomnia  Primary Insomnia  Atrial Fibrillation (Hcc)  Burning Sensation of Skin  Controlled Drug Dependence (Hcc)  Essential Hypertension  Hardening of The Aorta (Main Artery of The Heart) (Hcc)  Hyperlipidemia  Shoulder Joint Pain  Major Depression in Remission (Hcc)  Microscopic Colitis  Vitamin B12 Deficiency (Non Anemic)  Chronic Pancreatitis (Hcc)  Other Long Term (Current) Drug Therapy  Nasal Valve Collapse  Acquired Nasal Deformity  Tachycardia  Hypothyroidism Associated With Surgical Procedure    Plan of Care  Problem-specific:  No problem-specific Assessment & Plan notes found for this encounter.  Ms. JAVEN RIDINGS has a current medication list which includes the following long-term medication(s): estradiol, fluticasone, hydrochlorothiazide, hydromorphone, ipratropium, potassium chloride, prochlorperazine, and  promethazine.  Pharmacotherapy (Medications Ordered): No orders of the defined types were placed in this encounter.  Orders:  Orders Placed This Encounter  Procedures  . Radiofrequency,Genicular    Standing Status:   Future    Standing Expiration Date:   03/05/2021    Scheduling Instructions:     Side(s): Right Knee     Level(s): Superior-Lateral, Superior-Medial, and Inferior-Medial Genicular Nerve(s)     Sedation: With Sedation.     Scheduling Timeframe: As soon as pre-approved    Order Specific Question:   Where will this procedure be performed?    Answer:   [facility]  . DG Lumbar Spine Complete W/Bend    Patient presents with axial pain with possible radicular component.  In addition to any acute findings, please report on:  1. Facet (Zygapophyseal) joint DJD (Hypertrophy, space narrowing, subchondral sclerosis, and/or osteophyte formation) 2. DDD and/or IVDD (Loss of disc height, desiccation or "Black disc disease") 3. Pars defects 4. Spondylolisthesis, spondylosis, and/or spondyloarthropathies (include Degree/Grade of displacement in mm) 5. Vertebral body Fractures, including age (old, new/acute) 34. Modic Type Changes 7. Demineralization 8. Bone pathology 9. Central, Lateral Recess, and/or Foraminal Stenosis (include AP diameter of stenosis in mm) 10. Surgical changes (hardware type, status, and presence of fibrosis)  NOTE: Please specify level(s) and laterality. If applicable: Please indicate ROM and/or evidence of instability (>52m displacement between flexion and extension views)    Standing Status:   Future    Number of Occurrences:   1    Standing Expiration Date:   04/05/2020    Scheduling Instructions:     Imaging must be done as soon as possible. Inform patient that order will expire within 30 days and I will not renew it.    Order Specific Question:   Reason for Exam (SYMPTOM  OR DIAGNOSIS REQUIRED)    Answer:   Low back pain    Order Specific  Question:   Is patient pregnant?    Answer:   No    Order Specific Question:   Preferred imaging location?    Answer:   [facility]    Order Specific Question:   Call Results- Best Contact Number?    Answer:   [phone][facility]    Order Specific Question:   Radiology Contrast Protocol - do NOT remove file path    Answer:   [email or web address]    Order Specific Question:   Release to patient    Answer:   Immediate  . DG Si Joints    Standing Status:   Future    Number of Occurrences:   1    Standing Expiration Date:   04/05/2020    Scheduling Instructions:     Imaging must be done as soon as possible. Inform patient that order will expire within 30 days and I will not renew it.    Order Specific Question:   Reason for Exam (SYMPTOM  OR DIAGNOSIS REQUIRED)    Answer:   Sacroiliac joint pain    Order Specific Question:   Is the patient pregnant?    Answer:   No    Order Specific Question:   Preferred imaging location?  Answer:   [facility]    Order Specific Question:   Call Results- Best Contact Number?    Answer:   [phone] ([facility])    Order Specific Question:   Release to patient    Answer:   Immediate  . DG HIP UNILAT W OR W/O PELVIS 2-3 VIEWS RIGHT    Please describe any evidence of DJD, such as joint narrowing, asymmetry, cysts, or any anomalies in bone density, production, or erosion.    Standing Status:   Future    Number of Occurrences:   1    Standing Expiration Date:   04/05/2020    Scheduling Instructions:     Imaging must be done as soon as possible. Inform patient that order will expire within 30 days and I will not renew it.    Order Specific Question:   Reason for Exam (SYMPTOM  OR DIAGNOSIS REQUIRED)    Answer:   Sacroiliac joint pain    Order Specific Question:   Is the patient pregnant?    Answer:   No    Order Specific Question:   Preferred imaging location?    Answer:   [facility]    Order Specific Question:   Call Results- Best Contact Number?    Answer:   [phone] ([facility])    Order Specific Question:   Release to patient    Answer:   Immediate  . DG HIP UNILAT W OR W/O PELVIS 2-3 VIEWS LEFT    Please describe any evidence of DJD, such as joint narrowing, asymmetry, cysts, or any anomalies in bone density, production, or erosion.    Standing Status:   Future    Number of Occurrences:   1    Standing Expiration Date:   04/05/2020    Scheduling Instructions:     Imaging must be done as soon as possible. Inform patient that order will expire within 30 days and I will not renew it.    Order Specific Question:   Reason for Exam (SYMPTOM  OR DIAGNOSIS REQUIRED)    Answer:   Sacroiliac joint pain    Order Specific Question:   Is the patient pregnant?    Answer:   No    Order Specific Question:   Preferred imaging location?    Answer:   [facility]    Order Specific Question:   Call Results- Best Contact Number?    Answer:   [phone] ([facility])   Follow-up plan:   Return for RFA (13mn): (R) Genicular RFA #4.     Interventional Therapies  Risk  Complexity Considerations:   Latex allergy  Steroid allergy  [facility]  Planned  Pending:   Pending further evaluation   Under consideration:   Diagnostic bilateral SI joint injection  Therapeutic bilateral SI joint RFA  Diagnostic bilateral lumbar facet block  Therapeutic bilateral lumbar facet RFA  Diagnostic bilateral IA hip joint injection    Completed:   Palliative right-sided genicular nerve RFA  Palliative right-sided L3 and L4 lumbar sympathetic RFA    Therapeutic  Palliative (PRN) options:   Palliative right genicular nerve RFA  Palliative right L3 and L4 lumbar sympathetic RFA     Recent Visits No visits were found meeting these conditions. Showing recent visits within past 90 days and meeting all  other requirements Today's Visits Date Type Provider Dept  03/05/20 Office Visit NMilinda Pointer MD [facility]  Showing today's visits and meeting all other requirements Future Appointments No visits were found meeting these conditions. Showing future appointments within next 90 days and meeting all other requirements  I discussed the assessment and treatment plan with the patient. The patient was provided an opportunity to ask questions and all were answered. The patient agreed with the plan and demonstrated an understanding of the instructions.  Patient advised to call back or seek an in-person evaluation if the symptoms or condition worsens.  Duration of encounter: 42 minutes.  Note by: Gaspar Cola, MD Date: 03/05/2020; Time: 2:56 PM

## 2020-03-06 ENCOUNTER — Telehealth: Payer: Self-pay

## 2020-03-06 ENCOUNTER — Telehealth: Payer: Self-pay | Admitting: Cardiology

## 2020-03-06 DIAGNOSIS — R072 Precordial pain: Secondary | ICD-10-CM

## 2020-03-06 NOTE — Telephone Encounter (Signed)
-----   Message from Sueanne Margarita, MD sent at 03/04/2020  8:46 AM EST ----- Patient has coronary Ca in her LAD with a high score.  Please rescheduled Kari Green that she cancelled - it will need to be done at [facility] because she has a port that will need to be accessed.  Her LDL was high at 171 in Nov 2021.  Start Lipitor 40mg  daily and get an FLP and ALT In 6 weeks.  Needs to stop estrogen replacement.

## 2020-03-06 NOTE — Telephone Encounter (Signed)
Left message for patient to call back  

## 2020-03-06 NOTE — Telephone Encounter (Signed)
The patient has been notified of the result and verbalized understanding.  All questions (if any) were answered. Kari Iba, RN 03/06/2020 4:57 PM  Patient states that her PCP just started her on atorvastatin 40 mg daily. She also states she is now taking lasix 40 mg daily. I have added both of these to her medication list. Patient can only have labs drawn through her port. She will let us know when she is scheduled to have her port flushed in March 2022 so we can send over orders for FLP and ALT.  Advised patient that she will need to stop her estrogen replacement. Patient states that she cannot stop this medication because she has severe hot flashes. I advised her that she needs to talk with her OBGYN to see if there is something different that she can take.  I have placed orders for a lexiscan myoview. Patient states that she can have IV's and have it done at our office. I advised that our schedulers will be calling her to arrange the test.

## 2020-03-06 NOTE — Telephone Encounter (Signed)
Patient returning call for lab results. 

## 2020-03-07 NOTE — Telephone Encounter (Signed)
The patient has been notified of the result and verbalized understanding.  All questions (if any) were answered. Antonieta Iba, RN 03/07/2020 7:54 AM

## 2020-03-07 NOTE — Telephone Encounter (Signed)
Shared Decision Making/Informed Consent The risks [chest pain, shortness of breath, cardiac arrhythmias, dizziness, blood pressure fluctuations, myocardial infarction, stroke/transient ischemic attack, nausea, vomiting, allergic reaction, radiation exposure, metallic taste sensation and life-threatening complications (estimated to be 1 in 10,000)], benefits (risk stratification, diagnosing coronary artery disease, treatment guidance) and alternatives of a nuclear stress test were discussed in detail with Kari Green and she agrees to proceed.  

## 2020-03-11 ENCOUNTER — Telehealth: Payer: Self-pay | Admitting: Cardiology

## 2020-03-11 DIAGNOSIS — D485 Neoplasm of uncertain behavior of skin: Secondary | ICD-10-CM | POA: Diagnosis not present

## 2020-03-11 DIAGNOSIS — L821 Other seborrheic keratosis: Secondary | ICD-10-CM | POA: Diagnosis not present

## 2020-03-11 DIAGNOSIS — L570 Actinic keratosis: Secondary | ICD-10-CM | POA: Diagnosis not present

## 2020-03-11 DIAGNOSIS — D0471 Carcinoma in situ of skin of right lower limb, including hip: Secondary | ICD-10-CM | POA: Diagnosis not present

## 2020-03-11 NOTE — Telephone Encounter (Signed)
° ° °  Pt would like to speak with a nurse regarding her mychart message she sent today.

## 2020-03-11 NOTE — Telephone Encounter (Signed)
The patient was instructed via MyChart message to go to the ER because of CP.  When called back at her request, she states she "mostly feels better" and does not want to go to the ER. She has experienced indigestion for weeks that will not improve with any medication. When she had her CP episode, it felt like she got punched in the middle of her chest. The pain radiated down her arm and she was nauseated. Her indigestion is "really bad" today but she has not eaten anything. She took 2 aspirins (she did not know dose) and says she feels better now. Reiterated to her that the ER is the best place for her to go since they will do an EKG (she sent some strips through MyChart but they are not very clear), CXR and serial labs. She is uninterested in going to the ER for treatment. Offered her several appointments with Dr. Radford Pax but none of them were a convenient time. She has not scheduled her stress test. Will have schedulers call her tomorrow to arrange ASAP for Dr. Radford Pax to review. ER precautions again reviewed and the patient states she will go if the episode happens again.

## 2020-03-18 ENCOUNTER — Ambulatory Visit (HOSPITAL_BASED_OUTPATIENT_CLINIC_OR_DEPARTMENT_OTHER): Payer: BC Managed Care – PPO | Admitting: Pain Medicine

## 2020-03-18 ENCOUNTER — Encounter: Payer: Self-pay | Admitting: Pain Medicine

## 2020-03-18 ENCOUNTER — Ambulatory Visit
Admission: RE | Admit: 2020-03-18 | Discharge: 2020-03-18 | Disposition: A | Discharge status: Home / Self Care | Payer: BC Managed Care – PPO | Source: Ambulatory Visit | Attending: Pain Medicine | Admitting: Pain Medicine

## 2020-03-18 ENCOUNTER — Other Ambulatory Visit: Payer: Self-pay

## 2020-03-18 VITALS — BP 118/82 | HR 73 | Temp 97.4°F | Resp 18 | Ht 63.0 in | Wt 145.0 lb

## 2020-03-18 DIAGNOSIS — Z91040 Latex allergy status: Secondary | ICD-10-CM | POA: Insufficient documentation

## 2020-03-18 DIAGNOSIS — Z889 Allergy status to unspecified drugs, medicaments and biological substances status: Secondary | ICD-10-CM | POA: Insufficient documentation

## 2020-03-18 DIAGNOSIS — M25561 Pain in right knee: Secondary | ICD-10-CM

## 2020-03-18 DIAGNOSIS — M79604 Pain in right leg: Secondary | ICD-10-CM

## 2020-03-18 DIAGNOSIS — A630 Anogenital (venereal) warts: Secondary | ICD-10-CM | POA: Insufficient documentation

## 2020-03-18 DIAGNOSIS — G8929 Other chronic pain: Secondary | ICD-10-CM

## 2020-03-18 DIAGNOSIS — G90521 Complex regional pain syndrome I of right lower limb: Secondary | ICD-10-CM | POA: Insufficient documentation

## 2020-03-18 DIAGNOSIS — Z91041 Radiographic dye allergy status: Secondary | ICD-10-CM | POA: Diagnosis not present

## 2020-03-18 DIAGNOSIS — Z96651 Presence of right artificial knee joint: Secondary | ICD-10-CM | POA: Insufficient documentation

## 2020-03-18 DIAGNOSIS — G8918 Other acute postprocedural pain: Secondary | ICD-10-CM | POA: Insufficient documentation

## 2020-03-18 DIAGNOSIS — Z87898 Personal history of other specified conditions: Secondary | ICD-10-CM

## 2020-03-18 DIAGNOSIS — T84093S Other mechanical complication of internal left knee prosthesis, sequela: Secondary | ICD-10-CM

## 2020-03-18 DIAGNOSIS — Z95828 Presence of other vascular implants and grafts: Secondary | ICD-10-CM | POA: Insufficient documentation

## 2020-03-18 MED ORDER — DIPHENHYDRAMINE HCL 50 MG/ML IJ SOLN
INTRAMUSCULAR | Status: AC
  Filled 2020-03-18: qty 1

## 2020-03-18 MED ORDER — DIPHENHYDRAMINE HCL 50 MG/ML IJ SOLN
12.5000 mg | Freq: Once | INTRAMUSCULAR | Status: AC
  Administered 2020-03-18: 12.5 mg via INTRAVENOUS

## 2020-03-18 MED ORDER — LACTATED RINGERS IV SOLN
1000.0000 mL | Freq: Once | INTRAVENOUS | Status: AC
  Administered 2020-03-18: 1000 mL via INTRAVENOUS

## 2020-03-18 MED ORDER — SODIUM CHLORIDE (PF) 0.9 % IJ SOLN
INTRAMUSCULAR | Status: AC
  Filled 2020-03-18: qty 20

## 2020-03-18 MED ORDER — LIDOCAINE HCL (PF) 2 % IJ SOLN
INTRAMUSCULAR | Status: AC
  Filled 2020-03-18: qty 10

## 2020-03-18 MED ORDER — FENTANYL CITRATE (PF) 100 MCG/2ML IJ SOLN
INTRAMUSCULAR | Status: AC
  Filled 2020-03-18: qty 2

## 2020-03-18 MED ORDER — ROPIVACAINE HCL 2 MG/ML IJ SOLN
INTRAMUSCULAR | Status: AC
  Filled 2020-03-18: qty 10

## 2020-03-18 MED ORDER — ROPIVACAINE HCL 2 MG/ML IJ SOLN
9.0000 mL | Freq: Once | INTRAMUSCULAR | Status: AC
  Administered 2020-03-18: 9 mL via PERINEURAL

## 2020-03-18 MED ORDER — SODIUM CHLORIDE (PF) 0.9 % IJ SOLN
INTRAMUSCULAR | Status: AC
  Filled 2020-03-18: qty 10

## 2020-03-18 MED ORDER — PROMETHAZINE HCL 25 MG/ML IJ SOLN
12.5000 mg | INTRAMUSCULAR | Status: DC | PRN
  Administered 2020-03-18: 25 mg via INTRAVENOUS

## 2020-03-18 MED ORDER — PROMETHAZINE HCL 25 MG/ML IJ SOLN
INTRAMUSCULAR | Status: AC
  Filled 2020-03-18: qty 1

## 2020-03-18 MED ORDER — HYDROMORPHONE HCL 2 MG PO TABS: 2.0000 mg | ORAL_TABLET | Freq: Three times a day (TID) | ORAL | 0 refills | Status: AC | PRN

## 2020-03-18 MED ORDER — MIDAZOLAM HCL 5 MG/5ML IJ SOLN
1.0000 mg | INTRAMUSCULAR | Status: DC | PRN
  Administered 2020-03-18: 1 mg via INTRAVENOUS
  Administered 2020-03-18: 2 mg via INTRAVENOUS
  Administered 2020-03-18: 1 mg via INTRAVENOUS
  Filled 2020-03-18: qty 5

## 2020-03-18 MED ORDER — SODIUM CHLORIDE 0.9% FLUSH
10.0000 mL | INTRAVENOUS | Status: DC | PRN
  Administered 2020-03-18: 10 mL

## 2020-03-18 MED ORDER — METHYLPREDNISOLONE ACETATE 80 MG/ML IJ SUSP
INTRAMUSCULAR | Status: AC
  Filled 2020-03-18: qty 1

## 2020-03-18 MED ORDER — FENTANYL CITRATE (PF) 100 MCG/2ML IJ SOLN
25.0000 ug | INTRAMUSCULAR | Status: DC | PRN
  Administered 2020-03-18: 50 ug via INTRAVENOUS

## 2020-03-18 MED ORDER — CEFAZOLIN SODIUM 1 G IJ SOLR
INTRAMUSCULAR | Status: AC
  Filled 2020-03-18: qty 10

## 2020-03-18 MED ORDER — LIDOCAINE HCL 2 % IJ SOLN
20.0000 mL | Freq: Once | INTRAMUSCULAR | Status: AC
  Administered 2020-03-18: 200 mg
  Filled 2020-03-18: qty 20

## 2020-03-18 MED ORDER — HEPARIN SOD (PORK) LOCK FLUSH 10 UNIT/ML IV SOLN
5.0000 mL | Freq: Once | INTRAVENOUS | Status: DC
  Filled 2020-03-18: qty 5

## 2020-03-18 MED ORDER — METHYLPREDNISOLONE ACETATE 80 MG/ML IJ SUSP
80.0000 mg | Freq: Once | INTRAMUSCULAR | Status: AC
  Administered 2020-03-18: 80 mg

## 2020-03-18 MED ORDER — CEFAZOLIN SODIUM-DEXTROSE 1-4 GM/50ML-% IV SOLN
1.0000 g | Freq: Once | INTRAVENOUS | Status: AC
  Administered 2020-03-18: 1 g via INTRAVENOUS

## 2020-03-18 NOTE — Progress Notes (Signed)
Safety precautions to be maintained throughout the outpatient stay will include: orient to surroundings, keep bed in low position, maintain call bell within reach at all times, provide assistance with transfer out of bed and ambulation.  

## 2020-03-18 NOTE — Progress Notes (Signed)
PROVIDER NOTE: Information contained herein reflects review and annotations entered in association with encounter. Interpretation of such information and data should be left to medically-trained personnel. Information provided to patient can be located elsewhere in the medical record under "Patient Instructions". Document created using STT-dictation technology, any transcriptional errors that may result from process are unintentional.    Patient: Kari Green  Service Category: Procedure  Provider: Gaspar Cola, MD  DOB: 10/11/1965  DOS: 03/18/2020  Location: [facility]  MRN: [id number]  Setting: Ambulatory - outpatient  Referring Provider: Milinda Pointer, MD  Type: Established Patient  Specialty: Interventional Pain Management  PCP: Jani Gravel, MD   Primary Reason for Visit: Interventional Pain Management Treatment. CC: Knee Pain (right)  Procedure:          Anesthesia, Analgesia, Anxiolysis:  Type: Therapeutic Superolateral, Superomedial, and Inferomedial, Genicular Nerve Radiofrequency Ablation (destruction).   #4  Region: Lateral, Anterior, and Medial aspects of the knee joint, above and below the knee joint proper. Level: Superior and inferior to the knee joint. Laterality: Right  Type: Moderate (Conscious) Sedation combined with Local Anesthesia Indication(s): Analgesia and Anxiety Route: Intravenous (IV) IV Access: Secured Sedation: Meaningful verbal contact was maintained at all times during the procedure  Local Anesthetic: Lidocaine 1-2%  Position: Supine   Indications: 1. Chronic lower extremity pain (1ry area of Pain) (Right)   2. 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 Chronic knee pain (2ry area of Pain) (Right)   4. CRPS (complex regional pain syndrome) type I of lower limb (Right)    Port-A-Cath in place    Latex allergy    Multiple drug allergies (codeine, Lidoderm a DC if, sulfa, latex, IVP dye, Keflex,  Augmentin)    History of allergy to IVP dye    History of allergy to latex    Kari Green has been dealing with the above chronic pain for longer than three months and has either failed to respond, was unable to tolerate, or simply did not get enough benefit from other more conservative therapies including, but not limited to: 1. Over-the-counter medications 2. Anti-inflammatory medications 3. Muscle relaxants 4. Membrane stabilizers 5. Opioids 6. Physical therapy and/or chiropractic manipulation 7. Modalities (Heat, ice, etc.) 8. Invasive techniques such as nerve blocks. Kari Green has attained more than 50% relief of the pain from a series of diagnostic injections conducted in separate occasions.  Pain Score: Pre-procedure: 8 /10 Post-procedure: 0-No pain/10  Pre-op H&P Assessment:  Kari Green is a 55 y.o. (year old), female patient, seen today for interventional treatment. She  has a past surgical history that includes Total thyroidectomy (1993); cysto with hydrodistension (01/29/2011); Cystoscopy w/ retrogrades (01/29/2011); Fissurectomy (~ 1988 x2); Refractive surgery (Bilateral, ~ 1999; ~ 2013); Nasal sinus surgery (2013); Laparoscopic lysis of adhesions (02/11/2012); Excision haglund's deformity with achilles tendon repair (Bilateral, 2013-2014); Melanoma excision (Left, 2013); Cystoscopy with stent placement (2006); Appendectomy (~ 1992); Tonsillectomy and adenoidectomy (~ 1989); LAPAROSCOPY ADHESIOLYSIS AND REMOVAL POSSIBLE RIGHT OVARY REMNANT (04/18/2000); Knee arthroscopy (Right, 08/16/2000); Carpal tunnel release (Bilateral, right 07/26/2001;  left ?); DEBRIDEMENT RIGHT THUMB MP JOINT (Right, 04/10/2002); RIGHT THUMB FUSION OF MPJ (Right, 10/03/2002); DECOMPRESSION LEFT ULNAR NERVE, ELBOW (Left, 01/29/2003); RIGHT TOTAL KNEE REVISION ARTHROPLASTY (Right, 03/14/2007); CYSTO/ URETHRAL DILATION/ HYDRODISTENTION/ BLADDER BX (04/10/2003); Laparoscopic assisted vaginal hysterectomy (Feb  1999); LAPAROSCOPY LYSIS ADHESIONS/  BILATERAL SALPINGOOPHORECTOMY/  FULGERATION OF ENDOMETRIOSIS (1998); Anterior cervical decomp/discectomy fusion (03/10/2009); Esophagogastroduodenoscopy (egd) with esophageal dilation (10-26-2000); CYSTO/  BLADDER BX (03/08/2008); transthoracic echocardiogram (09/22/2012); cysto with hydrodistension (N/A, 05/08/2014); laparoscopy (02/11/2012); Transanal hemorrhoidal dearterialization (03/26/2011); Knee arthroscopy with fulkerson slide (Right, 11/29/2000); Laparoscopic cholecystectomy (03/04/2002); Total knee arthroplasty (Right, 09/10/2003; 12/08/2012); Carpometacarpel [facility]) suspension plasty (Right, 02/10/2015); Ganglion cyst excision (Right, 02/10/2015); NEGATIVE SLEEP STUDY (2006 approx .  per pt); cysto with hydrodistension (N/A, 03/27/2015); Kidney stone surgery; Anterior cervical decomp/discectomy fusion (N/A, 07/16/2015); Video bronchoscopy (Bilateral, 10/20/2015); Shoulder hemi-arthroplasty (Left, 12/04/2015); EUS (N/A, 04/23/2016); Trigger finger release (Left, 07/06/2016); IR FLUORO GUIDE PORT INSERTION RIGHT (11/04/2016); IR US Guide Vasc Access Right (11/04/2016); Shoulder arthroscopy (Left); Augmentation mammaplasty (Bilateral, 1996; 2001, 2016); Breast surgery (age 74); Cystoscopy with hydrodistension and biopsy (N/A, 06/29/2017); sympathetic nerve ablation (07/05/2017); and Lymph node biopsy (Left, 07/24/2018). Kari Green has a current medication list which includes the following prescription(s): atorvastatin, botox, cyanocobalamin, diclofenac sodium, diphenhydramine, estradiol, evzio, fluticasone, gabapentin, hydrochlorothiazide, hydromorphone, hydromorphone, hydroxyzine, ipratropium, ketoconazole, ketorolac, dayvigo, levothyroxine, multivitamin with minerals, NON FORMULARY, onabotulinumtoxina, potassium chloride, prochlorperazine, promethazine, tizanidine, topiramate, vitamin c, alprazolam, and alprazolam, and the following Facility-Administered Medications: fentanyl, heparin  flush, midazolam, promethazine, and sodium chloride flush. Her primarily concern today is the Knee Pain (right)  Initial Vital Signs:  Pulse/HCG Rate: 93ECG Heart Rate: 69 Temp: 97.7 F (36.5 C) Resp: 16 BP: (!) 137/103 SpO2: 100 %  BMI: Estimated body mass index is 25.69 kg/m as calculated from the following:   Height as of this encounter: 5\' 3"  (1.6 m).   Weight as of this encounter: 145 lb (65.8 kg).  Risk Assessment: Allergies: Reviewed. She is allergic to cobalt, contrast media [iodinated diagnostic agents], latex, nickel, peanuts [peanut oil], red dye, azithromycin, chloraprep one step [chlorhexidine gluconate], corticosteroids, flexeril [cyclobenzaprine], morphine and related, other, percocet [oxycodone-acetaminophen], supartz [sodium hyaluronate (avian)], surgical lubricant, tape, trazodone, yellow dye, yellow dyes (non-tartrazine), cephalexin, monistat [miconazole], prednisone, sulfa antibiotics, tea, and soap.  Allergy Precautions: None required Coagulopathies: Reviewed. None identified.  Blood-thinner therapy: None at this time Active Infection(s): Reviewed. None identified. Ms. Caviness is afebrile  Site Confirmation: Ms. Magid was asked to confirm the procedure and laterality before marking the site Procedure checklist: Completed Consent: Before the procedure and under the influence of no sedative(s), amnesic(s), or anxiolytics, the patient was informed of the treatment options, risks and possible complications. To fulfill our ethical and legal obligations, as recommended by the American Medical Association's Code of Ethics, I have informed the patient of my clinical impression; the nature and purpose of the treatment or procedure; the risks, benefits, and possible complications of the intervention; the alternatives, including doing nothing; the risk(s) and benefit(s) of the alternative treatment(s) or procedure(s); and the risk(s) and benefit(s) of doing nothing. The  patient was provided information about the general risks and possible complications associated with the procedure. These may include, but are not limited to: failure to achieve desired goals, infection, bleeding, organ or nerve damage, allergic reactions, paralysis, and death. In addition, the patient was informed of those risks and complications associated to the procedure, such as failure to decrease pain; infection; bleeding; organ or nerve damage with subsequent damage to sensory, motor, and/or autonomic systems, resulting in permanent pain, numbness, and/or weakness of one or several areas of the body; allergic reactions; (i.e.: anaphylactic reaction); and/or death. Furthermore, the patient was informed of those risks and complications associated with the medications. These include, but are not limited to: allergic reactions (i.e.: anaphylactic or anaphylactoid reaction(s)); adrenal axis suppression; blood sugar elevation that in diabetics may result in ketoacidosis or comma; water retention  that in patients with history of congestive heart failure may result in shortness of breath, pulmonary edema, and decompensation with resultant heart failure; weight gain; swelling or edema; medication-induced neural toxicity; particulate matter embolism and blood vessel occlusion with resultant organ, and/or nervous system infarction; and/or aseptic necrosis of one or more joints. Finally, the patient was informed that Medicine is not an exact science; therefore, there is also the possibility of unforeseen or unpredictable risks and/or possible complications that may result in a catastrophic outcome. The patient indicated having understood very clearly. We have given the patient no guarantees and we have made no promises. Enough time was given to the patient to ask questions, all of which were answered to the patient's satisfaction. Ms. Watchman has indicated that she wanted to continue with the  procedure. Attestation: I, the ordering provider, attest that I have discussed with the patient the benefits, risks, side-effects, alternatives, likelihood of achieving goals, and potential problems during recovery for the procedure that I have provided informed consent. Date  Time: 03/18/2020 12:52 PM  Pre-Procedure Preparation:  Monitoring: As per clinic protocol. Respiration, ETCO2, SpO2, BP, heart rate and rhythm monitor placed and checked for adequate function Safety Precautions: Patient was assessed for positional comfort and pressure points before starting the procedure. Time-out: I initiated and conducted the "Time-out" before starting the procedure, as per protocol. The patient was asked to participate by confirming the accuracy of the "Time Out" information. Verification of the correct person, site, and procedure were performed and confirmed by me, the nursing staff, and the patient. "Time-out" conducted as per Joint Commission's Universal Protocol (UP.01.01.01). Time: 1349  Description of Procedure:          Target Area: For Genicular Nerve radiofrequency ablation (destruction), the targets are: the superolateral genicular nerve, located in the lateral distal portion of the femoral shaft as it curves to form the lateral epicondyle, in the region of the distal femoral metaphysis; the superomedial genicular nerve, located in the medial distal portion of the femoral shaft as it curves to form the medial epicondyle; and the inferomedial genicular nerve, located in the medial, proximal portion of the tibial shaft, as it curves to form the medial epicondyle, in the region of the proximal tibial metaphysis. Approach: Anterior, ipsilateral approach. Area Prepped: Entire knee area, from mid-thigh to mid-shin, lateral, anterior, and medial aspects. DuraPrep (Iodine Povacrylex [0.7% available iodine] and Isopropyl Alcohol, 74% w/w) Safety Precautions: Aspiration looking for blood return was conducted  prior to all injections. At no point did we inject any substances, as a needle was being advanced. No attempts were made at seeking any paresthesias. Safe injection practices and needle disposal techniques used. Medications properly checked for expiration dates. SDV (single dose vial) medications used. Description of the Procedure: Protocol guidelines were followed. The patient was placed in position over the procedure table. The target area was identified and the area prepped in the usual manner. The skin and muscle were infiltrated with local anesthetic. Appropriate amount of time allowed to pass for local anesthetics to take effect. Radiofrequency needles were introduced to the target area using fluoroscopic guidance. Using the NeuroTherm NT1100 Radiofrequency Generator, sensory stimulation using 50 Hz was used to locate & identify the nerve, making sure that the needle was positioned such that there was no sensory stimulation below 0.3 V or above 0.7 V. Stimulation using 2 Hz was used to evaluate the motor component. Care was taken not to lesion any nerves that demonstrated motor stimulation of the  lower extremities at an output of less than 2.5 times that of the sensory threshold, or a maximum of 2.0 V. Once satisfactory placement of the needles was achieved, the numbing solution was slowly injected after negative aspiration. After waiting for at least 2 minutes, the ablation was performed at 80 degrees C for 60 seconds, using regular Radiofrequency settings. Once the procedure was completed, the needles were then removed and the area cleansed, making sure to leave some of the prepping solution back to take advantage of its long term bactericidal properties. Intra-operative Compliance: Compliant   Vitals:   03/18/20 1415 03/18/20 1425 03/18/20 1435 03/18/20 1445  BP: (!) 162/102 115/80 102/85 118/82  Pulse: 73     Resp: 12 12 12 18   Temp:  97.7 F (36.5 C)  (!) 97.4 F (36.3 C)  SpO2: 100% 100% 100%  100%  Weight:      Height:        Start Time: 1349 hrs. End Time: 1414 hrs. Materials & Medications:  Needle(s) Type: Teflon-coated, curved tip, Radiofrequency needle(s) Gauge: 22G Length: 10cm Medication(s): Please see orders for medications and dosing details.  Imaging Guidance (Non-Spinal):          Type of Imaging Technique: Fluoroscopy Guidance (Non-Spinal) Indication(s): Assistance in needle guidance and placement for procedures requiring needle placement in or near specific anatomical locations not easily accessible without such assistance. Exposure Time: Please see nurses notes. Contrast: Before injecting any contrast, we confirmed that the patient did not have an allergy to iodine, shellfish, or radiological contrast. Once satisfactory needle placement was completed at the desired level, radiological contrast was injected. Contrast injected under live fluoroscopy. No contrast complications. See chart for type and volume of contrast used. Fluoroscopic Guidance: I was personally present during the use of fluoroscopy. "Tunnel Vision Technique" used to obtain the best possible view of the target area. Parallax error corrected before commencing the procedure. "Direction-depth-direction" technique used to introduce the needle under continuous pulsed fluoroscopy. Once target was reached, antero-posterior, oblique, and lateral fluoroscopic projection used confirm needle placement in all planes. Images permanently stored in EMR. Interpretation: I personally interpreted the imaging intraoperatively. Adequate needle placement confirmed in multiple planes. Appropriate spread of contrast into desired area was observed. No evidence of afferent or efferent intravascular uptake. Permanent images saved into the patient's record.  Antibiotic Prophylaxis:   Anti-infectives (From admission, onward)   Start     Dose/Rate Route Frequency Ordered Stop   03/18/20 1300  ceFAZolin (ANCEF) IVPB 1 g/50 mL  premix        1 g 100 mL/hr over 30 Minutes Intravenous  Once 03/18/20 1257 03/18/20 1400     Indication(s): None identified  Post-operative Assessment:  Post-procedure Vital Signs:  Pulse/HCG Rate: 7367 Temp: (!) 97.4 F (36.3 C) Resp: 18 BP: 118/82 SpO2: 100 %  EBL: None  Complications: No immediate post-treatment complications observed by team, or reported by patient.  Note: The patient tolerated the entire procedure well. A repeat set of vitals were taken after the procedure and the patient was kept under observation following institutional policy, for this type of procedure. Post-procedural neurological assessment was performed, showing return to baseline, prior to discharge. The patient was provided with post-procedure discharge instructions, including a section on how to identify potential problems. Should any problems arise concerning this procedure, the patient was given instructions to immediately contact us, at any time, without hesitation. In any case, we plan to contact the patient by telephone for a follow-up  status report regarding this interventional procedure.  Comments:  No additional relevant information.  Plan of Care  Orders:  Orders Placed This Encounter  Procedures  . Radiofrequency,Genicular    Scheduling Instructions:     Side(s): Right Knee     Level(s): Superior-Lateral, Superior-Medial, and Inferior-Medial Genicular Nerve(s)     Sedation: With Sedation.     Timeframe: Today    Order Specific Question:   Where will this procedure be performed?    Answer:   [facility]  . [facility] C-ARM 1-60 MIN NO REPORT    Intraoperative interpretation by procedural physician at [place].    Standing Status:   Standing    Number of Occurrences:   1    Order Specific Question:   Reason for exam:    Answer:   Assistance in needle guidance and placement for procedures requiring needle placement in or near specific anatomical locations not  easily accessible without such assistance.  . Provide equipment / supplies at bedside    "Radiofrequency Tray"; Large hemostat (x1); Small hemostat (x1); Towels (x8); 4x4 sterile sponge pack (x1) Needle type: Teflon-coated Radiofrequency Needle (Disposable  single use) Size: Regular Quantity: 3    Standing Status:   Standing    Number of Occurrences:   1    Order Specific Question:   Specify    Answer:   Radiofrequency Tray  . Informed Consent Details: Physician/Practitioner Attestation; Transcribe to consent form and obtain patient signature    Note: Always confirm laterality of pain with Ms. Rasch, before procedure. Transcribe to consent form and obtain patient signature.    Order Specific Question:   Physician/Practitioner attestation of informed consent for procedure/surgical case    Answer:   I, the physician/practitioner, attest that I have discussed with the patient the benefits, risks, side effects, alternatives, likelihood of achieving goals and potential problems during recovery for the procedure that I have provided informed consent.    Order Specific Question:   Procedure    Answer:   Radiofrequency ablation of the genicular nerves of the knee    Order Specific Question:   Physician/Practitioner performing the procedure    Answer:   Jahree Dermody A. Dossie Arbour, MD    Order Specific Question:   Indication/Reason    Answer:   Chronic knee pain  . Miscellanous precautions    Standing Status:   Standing    Number of Occurrences:   1  . Miscellanous precautions    NOTE: Although It is true that patients can have allergies to shellfish and that shellfish contain iodine, most shellfish  allergies are due to two protein allergens present in the shellfish: tropomyosins and parvalbumin. Not all patients with shellfish allergies are allergic to iodine. However, as a precaution, avoid using iodine containing products.    Standing Status:   Standing    Number of Occurrences:   1  . Latex  precautions    Activate Latex-Free Protocol.    Standing Status:   Standing    Number of Occurrences:   1   Chronic Opioid Analgesic:  No opioid analgesics prescribed by our practice.  The last time that we checked, the patient was getting her pain medications from the [facility].   Medications ordered for procedure: Meds ordered this encounter  Medications  . lidocaine (XYLOCAINE) 2 % (with pres) injection 400 mg  . lactated ringers infusion 1,000 mL  . midazolam (VERSED) 5 MG/5ML injection 1-2 mg    Make  sure Flumazenil is available in the pyxis when using this medication. If oversedation occurs, administer 0.2 mg IV over 15 sec. If after 45 sec no response, administer 0.2 mg again over 1 min; may repeat at 1 min intervals; not to exceed 4 doses (1 mg)  . fentaNYL (SUBLIMAZE) injection 25-50 mcg    Make sure Narcan is available in the pyxis when using this medication. In the event of respiratory depression (RR< 8/min): Titrate NARCAN (naloxone) in increments of 0.1 to 0.2 mg IV at 2-3 minute intervals, until desired degree of reversal.  . diphenhydrAMINE (BENADRYL) injection 12.5 mg  . promethazine (PHENERGAN) injection 12.5-25 mg  . methylPREDNISolone acetate (DEPO-MEDROL) injection 80 mg  . ropivacaine (PF) 2 mg/mL (0.2%) (NAROPIN) injection 9 mL  . sodium chloride flush (NS) 0.9 % injection 10 mL  . heparin flush 10 UNIT/ML injection 50 Units  . HYDROmorphone (DILAUDID) 2 MG tablet    Sig: Take 1 tablet (2 mg total) by mouth every 8 (eight) hours as needed for up to 7 days for severe pain. Must last 7 days.    Dispense:  21 tablet    Refill:  0    For acute postop pain s/p radiofrequency ablation.  Avoid benzodiazepines within 8 hours of opioids  . ceFAZolin (ANCEF) IVPB 1 g/50 mL premix    Order Specific Question:   Antibiotic Indication:    Answer:   Surgical Prophylaxis    Order Specific Question:   Other Indication:    Answer:   Procedure Prophylaxis   Medications  administered: We administered lidocaine, lactated ringers, midazolam, fentaNYL, diphenhydrAMINE, promethazine, methylPREDNISolone acetate, ropivacaine (PF) 2 mg/mL (0.2%), sodium chloride flush, and ceFAZolin.  See the medical record for exact dosing, route, and time of administration.  Follow-up plan:   Return in about 6 weeks (around 04/29/2020) for (F2F), (PP) Follow-up.       Interventional Therapies  Risk  Complexity Considerations:   Latex allergy  Steroid allergy  The patient gets her pain medications at the [facility]   Planned  Pending:   Pending further evaluation   Under consideration:   Diagnostic bilateral SI joint injection  Therapeutic bilateral SI joint RFA  Diagnostic bilateral lumbar facet block  Therapeutic bilateral lumbar facet RFA  Diagnostic bilateral IA hip joint injection    Completed:   Palliative right-sided genicular nerve RFA  Palliative right-sided L3 and L4 lumbar sympathetic RFA    Therapeutic  Palliative (PRN) options:   Palliative right genicular nerve RFA  Palliative right L3 and L4 lumbar sympathetic RFA      Recent Visits Date Type Provider Dept  03/05/20 Office Visit Milinda Pointer, MD [facility]  Showing recent visits within past 90 days and meeting all other requirements Today's Visits Date Type Provider Dept  03/18/20 Procedure visit Milinda Pointer, MD [facility]  Showing today's visits and meeting all other requirements Future Appointments Date Type Provider Dept  05/12/20 Appointment Milinda Pointer, MD [facility]  Showing future appointments within next 90 days and meeting all other requirements  Disposition: Discharge home  Discharge (Date  Time): 03/18/2020; 1446 hrs.   Primary Care Physician: Jani Gravel, MD Location: [facility] Note by: Gaspar Cola, MD Date: 03/18/2020; Time: 3:42 PM  Disclaimer:  Medicine is not an Chief Strategy Officer.  The only guarantee in medicine is that nothing is guaranteed. It is important to note that the decision to proceed with this intervention was based on  the information collected from the patient. The Data and conclusions were drawn from the patient's questionnaire, the interview, and the physical examination. Because the information was provided in large part by the patient, it cannot be guaranteed that it has not been purposely or unconsciously manipulated. Every effort has been made to obtain as much relevant data as possible for this evaluation. It is important to note that the conclusions that lead to this procedure are derived in large part from the available data. Always take into account that the treatment will also be dependent on availability of resources and existing treatment guidelines, considered by other Pain Management Practitioners as being common knowledge and practice, at the time of the intervention. For Medico-Legal purposes, it is also important to point out that variation in procedural techniques and pharmacological choices are the acceptable norm. The indications, contraindications, technique, and results of the above procedure should only be interpreted and judged by a Board-Certified Interventional Pain Specialist with extensive familiarity and expertise in the same exact procedure and technique.

## 2020-03-18 NOTE — Patient Instructions (Signed)

## 2020-03-19 ENCOUNTER — Telehealth: Payer: Self-pay | Admitting: *Deleted

## 2020-03-19 NOTE — Telephone Encounter (Signed)
No problems post procedure. 

## 2020-03-25 ENCOUNTER — Telehealth (HOSPITAL_COMMUNITY): Payer: Self-pay | Admitting: Cardiology

## 2020-03-25 NOTE — Telephone Encounter (Signed)
Patient will need to have her Myoview scheduled at the Hospital due to patient has a Port and it is an issue to get patient vein access for IV.  Please reach out to [facility] to schedule.

## 2020-03-26 ENCOUNTER — Encounter (HOSPITAL_COMMUNITY): Payer: BC Managed Care – PPO

## 2020-04-01 ENCOUNTER — Inpatient Hospital Stay (HOSPITAL_COMMUNITY): Admission: RE | Admit: 2020-04-01 | Payer: BC Managed Care – PPO | Source: Ambulatory Visit

## 2020-04-02 DIAGNOSIS — R079 Chest pain, unspecified: Secondary | ICD-10-CM | POA: Diagnosis not present

## 2020-04-02 DIAGNOSIS — R1013 Epigastric pain: Secondary | ICD-10-CM | POA: Diagnosis not present

## 2020-04-02 DIAGNOSIS — R1310 Dysphagia, unspecified: Secondary | ICD-10-CM | POA: Diagnosis not present

## 2020-04-03 ENCOUNTER — Encounter (HOSPITAL_COMMUNITY): Payer: BC Managed Care – PPO

## 2020-04-03 DIAGNOSIS — L820 Inflamed seborrheic keratosis: Secondary | ICD-10-CM | POA: Diagnosis not present

## 2020-04-04 ENCOUNTER — Inpatient Hospital Stay (HOSPITAL_COMMUNITY): Admission: RE | Admit: 2020-04-04 | Payer: BC Managed Care – PPO | Source: Ambulatory Visit

## 2020-04-08 ENCOUNTER — Ambulatory Visit (HOSPITAL_BASED_OUTPATIENT_CLINIC_OR_DEPARTMENT_OTHER): Payer: BC Managed Care – PPO | Admitting: Pain Medicine

## 2020-04-08 ENCOUNTER — Other Ambulatory Visit: Payer: Self-pay

## 2020-04-08 ENCOUNTER — Encounter: Payer: Self-pay | Admitting: Pain Medicine

## 2020-04-08 ENCOUNTER — Ambulatory Visit
Admission: RE | Admit: 2020-04-08 | Discharge: 2020-04-08 | Disposition: A | Discharge status: Home / Self Care | Payer: BC Managed Care – PPO | Source: Ambulatory Visit | Attending: Pain Medicine | Admitting: Pain Medicine

## 2020-04-08 VITALS — BP 156/84 | HR 94 | Temp 97.8°F | Resp 16 | Ht 64.0 in | Wt 143.0 lb

## 2020-04-08 DIAGNOSIS — M25561 Pain in right knee: Secondary | ICD-10-CM | POA: Insufficient documentation

## 2020-04-08 DIAGNOSIS — M79604 Pain in right leg: Secondary | ICD-10-CM | POA: Diagnosis not present

## 2020-04-08 DIAGNOSIS — G8918 Other acute postprocedural pain: Secondary | ICD-10-CM | POA: Insufficient documentation

## 2020-04-08 DIAGNOSIS — Z91041 Radiographic dye allergy status: Secondary | ICD-10-CM

## 2020-04-08 DIAGNOSIS — Z885 Allergy status to narcotic agent status: Secondary | ICD-10-CM | POA: Insufficient documentation

## 2020-04-08 DIAGNOSIS — G90521 Complex regional pain syndrome I of right lower limb: Secondary | ICD-10-CM

## 2020-04-08 DIAGNOSIS — Z96651 Presence of right artificial knee joint: Secondary | ICD-10-CM | POA: Insufficient documentation

## 2020-04-08 DIAGNOSIS — Z1211 Encounter for screening for malignant neoplasm of colon: Secondary | ICD-10-CM | POA: Insufficient documentation

## 2020-04-08 DIAGNOSIS — Z87898 Personal history of other specified conditions: Secondary | ICD-10-CM | POA: Insufficient documentation

## 2020-04-08 DIAGNOSIS — G8929 Other chronic pain: Secondary | ICD-10-CM | POA: Diagnosis not present

## 2020-04-08 DIAGNOSIS — Z91040 Latex allergy status: Secondary | ICD-10-CM

## 2020-04-08 DIAGNOSIS — Z882 Allergy status to sulfonamides status: Secondary | ICD-10-CM | POA: Diagnosis not present

## 2020-04-08 DIAGNOSIS — Z881 Allergy status to other antibiotic agents status: Secondary | ICD-10-CM | POA: Diagnosis not present

## 2020-04-08 DIAGNOSIS — Z888 Allergy status to other drugs, medicaments and biological substances status: Secondary | ICD-10-CM | POA: Insufficient documentation

## 2020-04-08 DIAGNOSIS — Z95828 Presence of other vascular implants and grafts: Secondary | ICD-10-CM

## 2020-04-08 DIAGNOSIS — R1310 Dysphagia, unspecified: Secondary | ICD-10-CM | POA: Insufficient documentation

## 2020-04-08 DIAGNOSIS — R1013 Epigastric pain: Secondary | ICD-10-CM | POA: Insufficient documentation

## 2020-04-08 DIAGNOSIS — Z883 Allergy status to other anti-infective agents status: Secondary | ICD-10-CM | POA: Insufficient documentation

## 2020-04-08 DIAGNOSIS — R933 Abnormal findings on diagnostic imaging of other parts of digestive tract: Secondary | ICD-10-CM | POA: Insufficient documentation

## 2020-04-08 MED ORDER — ORPHENADRINE CITRATE 30 MG/ML IJ SOLN
60.0000 mg | Freq: Once | INTRAMUSCULAR | Status: AC
  Administered 2020-04-08: 60 mg via INTRAMUSCULAR

## 2020-04-08 MED ORDER — SODIUM CHLORIDE 0.9% FLUSH
10.0000 mL | INTRAVENOUS | Status: DC | PRN
  Administered 2020-04-08: 10 mL

## 2020-04-08 MED ORDER — CEFAZOLIN SODIUM 1 G IJ SOLR
INTRAMUSCULAR | Status: AC
  Filled 2020-04-08: qty 10

## 2020-04-08 MED ORDER — DIPHENHYDRAMINE HCL 50 MG/ML IJ SOLN
25.0000 mg | INTRAMUSCULAR | Status: DC | PRN
  Administered 2020-04-08: 50 mg via INTRAVENOUS

## 2020-04-08 MED ORDER — HEPARIN SOD (PORK) LOCK FLUSH 10 UNIT/ML IV SOLN
5.0000 mL | Freq: Once | INTRAVENOUS | Status: AC
  Administered 2020-04-08: 50 [IU]
  Filled 2020-04-08: qty 5

## 2020-04-08 MED ORDER — ONDANSETRON HCL 4 MG/2ML IJ SOLN
INTRAMUSCULAR | Status: AC
  Filled 2020-04-08: qty 2

## 2020-04-08 MED ORDER — KETOROLAC TROMETHAMINE 60 MG/2ML IM SOLN
INTRAMUSCULAR | Status: AC
  Filled 2020-04-08: qty 2

## 2020-04-08 MED ORDER — FENTANYL CITRATE (PF) 100 MCG/2ML IJ SOLN
25.0000 ug | INTRAMUSCULAR | Status: DC | PRN
  Filled 2020-04-08: qty 2

## 2020-04-08 MED ORDER — ORPHENADRINE CITRATE 30 MG/ML IJ SOLN
INTRAMUSCULAR | Status: AC
  Filled 2020-04-08: qty 2

## 2020-04-08 MED ORDER — DIPHENHYDRAMINE HCL 50 MG/ML IJ SOLN
INTRAMUSCULAR | Status: AC
  Filled 2020-04-08: qty 1

## 2020-04-08 MED ORDER — MIDAZOLAM HCL 5 MG/5ML IJ SOLN
1.0000 mg | INTRAMUSCULAR | Status: DC | PRN
  Filled 2020-04-08: qty 5

## 2020-04-08 MED ORDER — BUPIVACAINE-EPINEPHRINE (PF) 0.25% -1:200000 IJ SOLN
10.0000 mL | Freq: Once | INTRAMUSCULAR | Status: AC
  Administered 2020-04-08: 10 mL
  Filled 2020-04-08: qty 30

## 2020-04-08 MED ORDER — LIDOCAINE HCL 2 % IJ SOLN
20.0000 mL | Freq: Once | INTRAMUSCULAR | Status: AC
  Administered 2020-04-08: 200 mg
  Filled 2020-04-08: qty 20

## 2020-04-08 MED ORDER — SODIUM CHLORIDE (PF) 0.9 % IJ SOLN
INTRAMUSCULAR | Status: AC
  Filled 2020-04-08: qty 10

## 2020-04-08 MED ORDER — LACTATED RINGERS IV SOLN
1000.0000 mL | Freq: Once | INTRAVENOUS | Status: AC
  Administered 2020-04-08: 1000 mL via INTRAVENOUS

## 2020-04-08 MED ORDER — DEXAMETHASONE SODIUM PHOSPHATE 10 MG/ML IJ SOLN
10.0000 mg | Freq: Once | INTRAMUSCULAR | Status: AC
  Administered 2020-04-08: 10 mg
  Filled 2020-04-08: qty 1

## 2020-04-08 MED ORDER — CEFAZOLIN SODIUM-DEXTROSE 1-4 GM/50ML-% IV SOLN
1.0000 g | Freq: Once | INTRAVENOUS | Status: AC
  Administered 2020-04-08: 1 g via INTRAVENOUS

## 2020-04-08 MED ORDER — KETOROLAC TROMETHAMINE 60 MG/2ML IM SOLN
60.0000 mg | Freq: Once | INTRAMUSCULAR | Status: AC
  Administered 2020-04-08: 60 mg via INTRAMUSCULAR

## 2020-04-08 MED ORDER — ONDANSETRON HCL 4 MG/2ML IJ SOLN
4.0000 mg | Freq: Once | INTRAMUSCULAR | Status: AC
  Administered 2020-04-08: 4 mg via INTRAVENOUS

## 2020-04-08 NOTE — Progress Notes (Signed)
PROVIDER NOTE: Information contained herein reflects review and annotations entered in association with encounter. Interpretation of such information and data should be left to medically-trained personnel. Information provided to patient can be located elsewhere in the medical record under "Patient Instructions". Document created using STT-dictation technology, any transcriptional errors that may result from process are unintentional.    Patient: Kari Green  Service Category: Procedure  Provider: Gaspar Cola, MD  DOB: June 13, 1965  DOS: 04/08/2020  Location: [facility]  MRN: [id number]  Setting: Ambulatory - outpatient  Referring Provider: Jani Gravel, MD  Type: Established Patient  Specialty: Interventional Pain Management  PCP: Jani Gravel, MD   Primary Reason for Visit: Interventional Pain Management Treatment. CC: Other (Right groin area)  Procedure:          Anesthesia, Analgesia, Anxiolysis:  Type: Diagnostic Lumbar Sympathetic Block          Region:Lumbosacral Level: L3, L4 Laterality: Right-Sided Paravertebral   Starting temperature for right foot was: 80.6 F Postprocedure: 91.8 F  Type: Moderate (Conscious) Sedation combined with Local Anesthesia Indication(s): Analgesia and Anxiety Route: Intravenous (IV) IV Access: Secured Sedation: Meaningful verbal contact was maintained at all times during the procedure  Local Anesthetic: Lidocaine 1-2%  Position: Prone with head of the table was raised to facilitate breathing.   Indications: 1. Chronic lower extremity pain (1ry area of Pain) (Right)   2. CRPS (complex regional pain syndrome) type I of lower limb (Right)   3. 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 is a 55 y.o. (year old), female patient, seen today for interventional treatment. She  has a past surgical history that includes Total thyroidectomy (1993); cysto with hydrodistension (01/29/2011); Cystoscopy w/ retrogrades (01/29/2011); Fissurectomy (~ 1988 x2); Refractive surgery (Bilateral, ~ 1999; ~ 2013); Nasal sinus surgery (2013); Laparoscopic lysis of adhesions (02/11/2012); Excision haglund's deformity with achilles tendon repair (Bilateral, 2013-2014); Melanoma excision (Left, 2013); Cystoscopy with stent placement (2006); Appendectomy (~ 1992); Tonsillectomy and adenoidectomy (~ 1989); LAPAROSCOPY ADHESIOLYSIS AND REMOVAL POSSIBLE RIGHT OVARY REMNANT (04/18/2000); Knee arthroscopy (Right, 08/16/2000); Carpal tunnel release (Bilateral, right 07/26/2001;  left ?); DEBRIDEMENT RIGHT THUMB MP JOINT (Right, 04/10/2002); RIGHT THUMB FUSION OF MPJ (Right, 10/03/2002); DECOMPRESSION LEFT ULNAR NERVE, ELBOW (Left, 01/29/2003); RIGHT TOTAL KNEE REVISION ARTHROPLASTY (Right, 03/14/2007); CYSTO/ URETHRAL DILATION/ HYDRODISTENTION/ BLADDER BX (04/10/2003); Laparoscopic assisted vaginal hysterectomy (Feb 1999); LAPAROSCOPY LYSIS ADHESIONS/  BILATERAL SALPINGOOPHORECTOMY/  FULGERATION OF ENDOMETRIOSIS (1998); Anterior cervical decomp/discectomy fusion (03/10/2009); Esophagogastroduodenoscopy (egd) with esophageal dilation (10-26-2000); CYSTO/  BLADDER BX (03/08/2008); transthoracic echocardiogram (09/22/2012); cysto with hydrodistension (N/A, 05/08/2014); laparoscopy (02/11/2012); Transanal hemorrhoidal dearterialization (03/26/2011); Knee arthroscopy with fulkerson slide (Right, 11/29/2000); Laparoscopic cholecystectomy (03/04/2002); Total knee arthroplasty (Right, 09/10/2003; 12/08/2012); Carpometacarpel [facility]) suspension plasty (Right, 02/10/2015); Ganglion cyst excision (Right, 02/10/2015); NEGATIVE SLEEP STUDY (2006 approx .  per pt); cysto with hydrodistension (N/A, 03/27/2015); Kidney stone surgery;  Anterior cervical decomp/discectomy fusion (N/A, 07/16/2015); Video bronchoscopy (Bilateral, 10/20/2015); Shoulder hemi-arthroplasty (Left, 12/04/2015); EUS (N/A, 04/23/2016); Trigger finger release (Left, 07/06/2016); IR FLUORO GUIDE PORT INSERTION RIGHT (11/04/2016); IR US Guide Vasc Access Right (11/04/2016); Shoulder arthroscopy (Left); Augmentation mammaplasty (Bilateral, 1996; 2001, 2016); Breast surgery (age 55); Cystoscopy with hydrodistension and biopsy (  N/A, 06/29/2017); sympathetic nerve ablation (07/05/2017); and Lymph node biopsy (Left, 07/24/2018). [name] has a current medication list which includes the following prescription(s): alprazolam, atorvastatin, botox, cyanocobalamin, diclofenac sodium, diphenhydramine, estradiol, evzio, fluticasone, gabapentin, hydrochlorothiazide, hydromorphone, hydroxyzine, ipratropium, ketoconazole, ketorolac, dayvigo, levothyroxine, multivitamin with minerals, NON FORMULARY, onabotulinumtoxina, potassium chloride, promethazine, tizanidine, topiramate, vitamin c, alprazolam, hydromorphone, and prochlorperazine, and the following Facility-Administered Medications: diphenhydramine, fentanyl, midazolam, and sodium chloride flush. Her primarily concern today is the Other (Right groin area)  Initial Vital Signs:  Pulse/HCG Rate: 94ECG Heart Rate: 86 Temp: (!) 97.2 F (36.2 C) Resp: 18 BP: (!) 129/99 SpO2: 97 %  BMI: Estimated body mass index is 24.55 kg/m as calculated from the following:   Height as of this encounter: 5\' 4"  (1.626 m).   Weight as of this encounter: 143 lb (64.9 kg).  Risk Assessment: Allergies: Reviewed. She is allergic to cobalt, contrast media [iodinated diagnostic agents], latex, nickel, peanuts [peanut oil], red dye, azithromycin, chloraprep one step [chlorhexidine gluconate], corticosteroids, flexeril [cyclobenzaprine], morphine and related, other, percocet [oxycodone-acetaminophen], supartz [sodium hyaluronate (avian)], surgical lubricant,  tape, trazodone, yellow dye, yellow dyes (non-tartrazine), cephalexin, monistat [miconazole], prednisone, sulfa antibiotics, tea, and soap.  Allergy Precautions: None required Coagulopathies: Reviewed. None identified.  Blood-thinner therapy: None at this time Active Infection(s): Reviewed. None identified. Ms. Wantz is afebrile  Site Confirmation: Ms. Scafidi was asked to confirm the procedure and laterality before marking the site Procedure checklist: Completed Consent: Before the procedure and under the influence of no sedative(s), amnesic(s), or anxiolytics, the patient was informed of the treatment options, risks and possible complications. To fulfill our ethical and legal obligations, as recommended by the American Medical Association's Code of Ethics, I have informed the patient of my clinical impression; the nature and purpose of the treatment or procedure; the risks, benefits, and possible complications of the intervention; the alternatives, including doing nothing; the risk(s) and benefit(s) of the alternative treatment(s) or procedure(s); and the risk(s) and benefit(s) of doing nothing. The patient was provided information about the general risks and possible complications associated with the procedure. These may include, but are not limited to: failure to achieve desired goals, infection, bleeding, organ or nerve damage, allergic reactions, paralysis, and death. In addition, the patient was informed of those risks and complications associated to Spine-related procedures, such as failure to decrease pain; infection (i.e.: Meningitis, epidural or intraspinal abscess); bleeding (i.e.: epidural hematoma, subarachnoid hemorrhage, or any other type of intraspinal or peri-dural bleeding); organ or nerve damage (i.e.: Any type of peripheral nerve, nerve root, or spinal cord injury) with subsequent damage to sensory, motor, and/or autonomic systems, resulting in permanent pain, numbness, and/or  weakness of one or several areas of the body; allergic reactions; (i.e.: anaphylactic reaction); and/or death. Furthermore, the patient was informed of those risks and complications associated with the medications. These include, but are not limited to: allergic reactions (i.e.: anaphylactic or anaphylactoid reaction(s)); adrenal axis suppression; blood sugar elevation that in diabetics may result in ketoacidosis or comma; water retention that in patients with history of congestive heart failure may result in shortness of breath, pulmonary edema, and decompensation with resultant heart failure; weight gain; swelling or edema; medication-induced neural toxicity; particulate matter embolism and blood vessel occlusion with resultant organ, and/or nervous system infarction; and/or aseptic necrosis of one or more joints. Finally, the patient was informed that Medicine is not an exact science; therefore, there is also the possibility of unforeseen or unpredictable risks and/or possible complications that may result in  a catastrophic outcome. The patient indicated having understood very clearly. We have given the patient no guarantees and we have made no promises. Enough time was given to the patient to ask questions, all of which were answered to the patient's satisfaction. Ms. Ijames has indicated that she wanted to continue with the procedure. Attestation: I, the ordering provider, attest that I have discussed with the patient the benefits, risks, side-effects, alternatives, likelihood of achieving goals, and potential problems during recovery for the procedure that I have provided informed consent. Date  Time: 04/08/2020 10:11 AM  Pre-Procedure Preparation:  Monitoring: As per clinic protocol. Respiration, ETCO2, SpO2, BP, heart rate and rhythm monitor placed and checked for adequate function Safety Precautions: Patient was assessed for positional comfort and pressure points before starting the  procedure. Time-out: I initiated and conducted the "Time-out" before starting the procedure, as per protocol. The patient was asked to participate by confirming the accuracy of the "Time Out" information. Verification of the correct person, site, and procedure were performed and confirmed by me, the nursing staff, and the patient. "Time-out" conducted as per Joint Commission's Universal Protocol (UP.01.01.01). Time: 1100  Description of Procedure:          Target Area: For Lumbar Sympathetic Block(s), the target is the anterolateral aspect of the L3 & L4 vertebral bodies, where the lumbar sympathetic chain resides. Approach: Paravertebral, ipsilateral approach. Area Prepped: Entire Posterior Thoracolumbar Region DuraPrep (Iodine Povacrylex [0.7% available iodine] and Isopropyl Alcohol, 74% w/w) Safety Precautions: Aspiration looking for blood return was conducted prior to all injections. At no point did we inject any substances, as a needle was being advanced. No attempts were made at seeking any paresthesias. Safe injection practices and needle disposal techniques used. Medications properly checked for expiration dates. SDV (single dose vial) medications used. Description of the Procedure: Protocol guidelines were followed. The patient was placed in position over the procedure table. The target area was identified and the area prepped in the usual manner. Skin & deeper tissues infiltrated with local anesthetic. Appropriate amount of time allowed to pass for local anesthetics to take effect. The procedure needles were then advanced to the target area, the superior anterolateral border of the L3 vertebral body, under pulsed fluoroscopic guidance. Care was taken not to advance the tip of the needle past the anterior border of the vertebral body, on the lateral fluoroscopic view. Proper needle placement secured. Negative aspiration confirmed. Solution injected in intermittent fashion, asking for systemic  symptoms every 0.5cc of injectate. The needles were then removed and the area cleansed, making sure to leave some of the prepping solution back to take advantage of its long term bactericidal properties. Vitals:   04/08/20 1117 04/08/20 1127 04/08/20 1137 04/08/20 1147  BP: 125/79 121/80 122/78 (!) 156/84  Pulse:      Resp: 18 16 16 16   Temp:  (!) 97.2 F (36.2 C)  97.8 F (36.6 C)  SpO2: 100% 100% 100% 100%  Weight:      Height:        Start Time: 1100 hrs. End Time: 1117 hrs. Materials:  Needle(s) Type: Spinal Needle Gauge: 22G Length: 3.5-in Medication(s): Please see orders for medications and dosing details.  Imaging Guidance (Spinal):          Type of Imaging Technique: Fluoroscopy Guidance (Spinal) Indication(s): Assistance in needle guidance and placement for procedures requiring needle placement in or near specific anatomical locations not easily accessible without such assistance. Exposure Time: Please see nurses notes. Contrast:  Before injecting any contrast, we confirmed that the patient did not have an allergy to iodine, shellfish, or radiological contrast. Once satisfactory needle placement was completed at the desired level, radiological contrast was injected. Contrast injected under live fluoroscopy. No contrast complications. See chart for type and volume of contrast used. Fluoroscopic Guidance: I was personally present during the use of fluoroscopy. "Tunnel Vision Technique" used to obtain the best possible view of the target area. Parallax error corrected before commencing the procedure. "Direction-depth-direction" technique used to introduce the needle under continuous pulsed fluoroscopy. Once target was reached, antero-posterior, oblique, and lateral fluoroscopic projection used confirm needle placement in all planes. Images permanently stored in EMR. Interpretation: I personally interpreted the imaging intraoperatively. Adequate needle placement confirmed in multiple  planes. Appropriate spread of contrast into desired area was observed. No evidence of afferent or efferent intravascular uptake. No intrathecal or subarachnoid spread observed. Permanent images saved into the patient's record.  Antibiotic Prophylaxis:   Anti-infectives (From admission, onward)   Start     Dose/Rate Route Frequency Ordered Stop   04/08/20 1030  ceFAZolin (ANCEF) IVPB 1 g/50 mL premix        1 g 100 mL/hr over 30 Minutes Intravenous  Once 04/08/20 1028 04/08/20 1117     Indication(s): None identified  Post-operative Assessment:  Post-procedure Vital Signs:  Pulse/HCG Rate: 9481 Temp: 97.8 F (36.6 C) (left 79.4 right 91.8) Resp: 16 BP: (!) 156/84 SpO2: 100 %  EBL: None  Complications: No immediate post-treatment complications observed by team, or reported by patient.  Note: The patient tolerated the entire procedure well. A repeat set of vitals were taken after the procedure and the patient was kept under observation following institutional policy, for this type of procedure. Post-procedural neurological assessment was performed, showing return to baseline, prior to discharge. The patient was provided with post-procedure discharge instructions, including a section on how to identify potential problems. Should any problems arise concerning this procedure, the patient was given instructions to immediately contact us, at any time, without hesitation. In any case, we plan to contact the patient by telephone for a follow-up status report regarding this interventional procedure.  Comments:  No additional relevant information.  Plan of Care  Orders:  Orders Placed This Encounter  Procedures  . LUMBAR SYMPATHETIC BLOCK    For sympathetically-mediated lower extremity pain.    Scheduling Instructions:     Purpose: Palliative     Laterality: Right-sided     Level(s): Lumbar sympathetic chain (L3, L4)     Sedation: Sedation recommended.     Scheduling Timeframe: Today     Order Specific Question:   Where will this procedure be performed?    Answer:   [facility]  . DG [facility] C-ARM 1-60 MIN NO REPORT    Intraoperative interpretation by procedural physician at [place].    Standing Status:   Standing    Number of Occurrences:   1    Order Specific Question:   Reason for exam:    Answer:   Assistance in needle guidance and placement for procedures requiring needle placement in or near specific anatomical locations not easily accessible without such assistance.  . Informed Consent Details: Physician/Practitioner Attestation; Transcribe to consent form and obtain patient signature    Provider Attestation: I, Ryan Dossie Arbour, MD, (Pain Management Specialist), the physician/practitioner, attest that I have discussed with the patient the benefits, risks, side effects, alternatives, likelihood of achieving goals and potential problems during recovery  for the procedure that I have provided informed consent.    Scheduling Instructions:     Note: Always confirm laterality of pain with Ms. Transue, before procedure.    Order Specific Question:   Physician/Practitioner attestation of informed consent for procedure/surgical case    Answer:   I, the physician/practitioner, attest that I have discussed with the patient the benefits, risks, side effects, alternatives, likelihood of achieving goals and potential problems during recovery for the procedure that I have provided informed consent.    Order Specific Question:   Procedure    Answer:   Lumbar sympathetic block    Order Specific Question:   Physician/Practitioner performing the procedure    Answer:   Magnus Crescenzo A. Dossie Arbour, MD    Order Specific Question:   Indication/Reason    Answer:   Lower extremity sympathetically mediated pain  . Provide equipment / supplies at bedside    "Block Tray" (Disposable  single use) Needle type: SpinalSpinal Amount/quantity: 2 Size: Medium  (5-inch) Gauge: 22G    Standing Status:   Standing    Number of Occurrences:   1    Order Specific Question:   Specify    Answer:   Block Tray  . Miscellanous precautions    Standing Status:   Standing    Number of Occurrences:   1  . Miscellanous precautions    NOTE: Although It is true that patients can have allergies to shellfish and that shellfish contain iodine, most shellfish  allergies are due to two protein allergens present in the shellfish: tropomyosins and parvalbumin. Not all patients with shellfish allergies are allergic to iodine. However, as a precaution, avoid using iodine containing products.    Standing Status:   Standing    Number of Occurrences:   1  . Latex precautions    Activate Latex-Free Protocol.    Standing Status:   Standing    Number of Occurrences:   1   Chronic Opioid Analgesic:  No opioid analgesics prescribed by our practice.  The last time that we checked, the patient was getting her pain medications from the [facility].   Medications ordered for procedure: Meds ordered this encounter  Medications  . lidocaine (XYLOCAINE) 2 % (with pres) injection 400 mg  . lactated ringers infusion 1,000 mL  . midazolam (VERSED) 5 MG/5ML injection 1-2 mg    Make sure Flumazenil is available in the pyxis when using this medication. If oversedation occurs, administer 0.2 mg IV over 15 sec. If after 45 sec no response, administer 0.2 mg again over 1 min; may repeat at 1 min intervals; not to exceed 4 doses (1 mg)  . fentaNYL (SUBLIMAZE) injection 25-50 mcg    Make sure Narcan is available in the pyxis when using this medication. In the event of respiratory depression (RR< 8/min): Titrate NARCAN (naloxone) in increments of 0.1 to 0.2 mg IV at 2-3 minute intervals, until desired degree of reversal.  . ondansetron (ZOFRAN) injection 4 mg  . diphenhydrAMINE (BENADRYL) injection 25 mg  . ceFAZolin (ANCEF) IVPB 1 g/50 mL premix    Order Specific Question:   Antibiotic  Indication:    Answer:   Surgical Prophylaxis    Order Specific Question:   Other Indication:    Answer:   Surgical Prophylaxis  . dexamethasone (DECADRON) injection 10 mg  . bupivacaine-epinephrine (MARCAINE W/ EPI) 0.25% -1:200000 injection 10 mL  . sodium chloride flush (NS) 0.9 % injection 10 mL  . heparin flush 10 UNIT/ML  injection 50 Units  . ketorolac (TORADOL) injection 60 mg  . orphenadrine (NORFLEX) injection 60 mg   Medications administered: We administered lidocaine, lactated ringers, ondansetron, diphenhydrAMINE, ceFAZolin, dexamethasone, bupivacaine-epinephrine, sodium chloride flush, heparin flush, ketorolac, and orphenadrine.  See the medical record for exact dosing, route, and time of administration.  Follow-up plan:   Return in about 2 weeks (around 04/22/2020) for (F2F), (PP) Follow-up.      Interventional Therapies  Risk  Complexity Considerations:   Latex allergy  Steroid allergy  The patient gets her pain medications at the [facility]   Planned  Pending:   Pending further evaluation   Under consideration:   Diagnostic bilateral SI joint injection  Therapeutic bilateral SI joint RFA  Diagnostic bilateral lumbar facet block  Therapeutic bilateral lumbar facet RFA  Diagnostic bilateral IA hip joint injection    Completed:   Palliative right-sided genicular nerve RFA  Palliative right-sided L3 and L4 lumbar sympathetic RFA    Therapeutic  Palliative (PRN) options:   Palliative right genicular nerve RFA  Palliative right L3 and L4 lumbar sympathetic RFA     Recent Visits Date Type Provider Dept  03/18/20 Procedure visit Milinda Pointer, MD [facility]  03/05/20 Office Visit Milinda Pointer, MD [facility]  Showing recent visits within past 90 days and meeting all other requirements Today's Visits Date Type Provider Dept  04/08/20 Procedure visit Milinda Pointer, MD [facility]  Showing today's visits and  meeting all other requirements Future Appointments Date Type Provider Dept  05/12/20 Appointment Milinda Pointer, MD [facility]  Showing future appointments within next 90 days and meeting all other requirements  Disposition: Discharge home  Discharge (Date  Time): 04/08/2020; 1202 (having diffulty lifting right leg up.  Dr Dossie Arbour notified.  Able to bear weight.  Toradol/Norlex given IM as ordered.) hrs.   Primary Care Physician: Jani Gravel, MD Location: [facility] Note by: Gaspar Cola, MD Date: 04/08/2020; Time: 2:21 PM  Disclaimer:  Medicine is not an Chief Strategy Officer. The only guarantee in medicine is that nothing is guaranteed. It is important to note that the decision to proceed with this intervention was based on the information collected from the patient. The Data and conclusions were drawn from the patient's questionnaire, the interview, and the physical examination. Because the information was provided in large part by the patient, it cannot be guaranteed that it has not been purposely or unconsciously manipulated. Every effort has been made to obtain as much relevant data as possible for this evaluation. It is important to note that the conclusions that lead to this procedure are derived in large part from the available data. Always take into account that the treatment will also be dependent on availability of resources and existing treatment guidelines, considered by other Pain Management Practitioners as being common knowledge and practice, at the time of the intervention. For Medico-Legal purposes, it is also important to point out that variation in procedural techniques and pharmacological choices are the acceptable norm. The indications, contraindications, technique, and results of the above procedure should only be interpreted and judged by a Board-Certified Interventional Pain Specialist with extensive familiarity and expertise in the  same exact procedure and technique.

## 2020-04-08 NOTE — Patient Instructions (Signed)

## 2020-04-08 NOTE — Progress Notes (Signed)
Safety precautions to be maintained throughout the outpatient stay will include: orient to surroundings, keep bed in low position, maintain call bell within reach at all times, provide assistance with transfer out of bed and ambulation.  

## 2020-04-09 ENCOUNTER — Telehealth (HOSPITAL_COMMUNITY): Payer: Self-pay | Admitting: *Deleted

## 2020-04-09 ENCOUNTER — Telehealth: Payer: Self-pay

## 2020-04-09 NOTE — Telephone Encounter (Signed)
Post procedure follow up phone call.  Patient states she is doing good.  

## 2020-04-09 NOTE — Telephone Encounter (Signed)
Patient given detailed instructions per Myocardial Perfusion Study Information Sheet for the test on 04/14/20. Patient notified to arrive 15 minutes early and that it is imperative to arrive on time for appointment to keep from having the test rescheduled.  If you need to cancel or reschedule your appointment, please call the office within 24 hours of your appointment. . Patient verbalized understanding. Trea Latner Jacqueline    

## 2020-04-10 ENCOUNTER — Encounter (HOSPITAL_COMMUNITY): Payer: BC Managed Care – PPO

## 2020-04-10 DIAGNOSIS — K209 Esophagitis, unspecified without bleeding: Secondary | ICD-10-CM | POA: Diagnosis not present

## 2020-04-10 DIAGNOSIS — R1310 Dysphagia, unspecified: Secondary | ICD-10-CM | POA: Diagnosis not present

## 2020-04-10 DIAGNOSIS — K2100 Gastro-esophageal reflux disease with esophagitis, without bleeding: Secondary | ICD-10-CM | POA: Diagnosis not present

## 2020-04-14 ENCOUNTER — Encounter (HOSPITAL_COMMUNITY): Payer: BC Managed Care – PPO

## 2020-04-14 ENCOUNTER — Telehealth (HOSPITAL_COMMUNITY): Payer: Self-pay | Admitting: Cardiology

## 2020-04-14 NOTE — Telephone Encounter (Signed)
Patient called this am and spoke with the [facility] and cancelled her Myoview that was scheduled. She did not wish to reschedule at this time. Order will be removed from the [place] and if pt calls back to reschedule we will reinstate the order.

## 2020-04-15 DIAGNOSIS — Z79899 Other long term (current) drug therapy: Secondary | ICD-10-CM | POA: Diagnosis not present

## 2020-04-15 DIAGNOSIS — Z79890 Hormone replacement therapy (postmenopausal): Secondary | ICD-10-CM | POA: Diagnosis not present

## 2020-04-15 DIAGNOSIS — E785 Hyperlipidemia, unspecified: Secondary | ICD-10-CM | POA: Diagnosis not present

## 2020-04-15 DIAGNOSIS — Z889 Allergy status to unspecified drugs, medicaments and biological substances status: Secondary | ICD-10-CM | POA: Diagnosis not present

## 2020-04-15 DIAGNOSIS — G8921 Chronic pain due to trauma: Secondary | ICD-10-CM | POA: Diagnosis not present

## 2020-04-15 DIAGNOSIS — G43009 Migraine without aura, not intractable, without status migrainosus: Secondary | ICD-10-CM | POA: Diagnosis not present

## 2020-04-15 DIAGNOSIS — I10 Essential (primary) hypertension: Secondary | ICD-10-CM | POA: Diagnosis not present

## 2020-04-15 DIAGNOSIS — F5101 Primary insomnia: Secondary | ICD-10-CM | POA: Diagnosis not present

## 2020-04-15 DIAGNOSIS — F419 Anxiety disorder, unspecified: Secondary | ICD-10-CM | POA: Diagnosis not present

## 2020-04-15 DIAGNOSIS — E039 Hypothyroidism, unspecified: Secondary | ICD-10-CM | POA: Diagnosis not present

## 2020-04-29 ENCOUNTER — Encounter (HOSPITAL_BASED_OUTPATIENT_CLINIC_OR_DEPARTMENT_OTHER): Payer: Self-pay

## 2020-04-29 ENCOUNTER — Other Ambulatory Visit: Payer: Self-pay

## 2020-04-29 ENCOUNTER — Emergency Department (HOSPITAL_BASED_OUTPATIENT_CLINIC_OR_DEPARTMENT_OTHER): Payer: BC Managed Care – PPO

## 2020-04-29 ENCOUNTER — Telehealth: Payer: Self-pay | Admitting: Cardiology

## 2020-04-29 ENCOUNTER — Emergency Department (HOSPITAL_BASED_OUTPATIENT_CLINIC_OR_DEPARTMENT_OTHER)
Admission: EM | Admit: 2020-04-29 | Discharge: 2020-04-29 | Disposition: A | Discharge status: Home / Self Care | Payer: BC Managed Care – PPO | Attending: Emergency Medicine | Admitting: Emergency Medicine

## 2020-04-29 DIAGNOSIS — Z91010 Allergy to peanuts: Secondary | ICD-10-CM | POA: Insufficient documentation

## 2020-04-29 DIAGNOSIS — R0789 Other chest pain: Secondary | ICD-10-CM

## 2020-04-29 DIAGNOSIS — R42 Dizziness and giddiness: Secondary | ICD-10-CM | POA: Diagnosis not present

## 2020-04-29 DIAGNOSIS — R079 Chest pain, unspecified: Secondary | ICD-10-CM | POA: Diagnosis not present

## 2020-04-29 DIAGNOSIS — M25512 Pain in left shoulder: Secondary | ICD-10-CM | POA: Insufficient documentation

## 2020-04-29 DIAGNOSIS — J45909 Unspecified asthma, uncomplicated: Secondary | ICD-10-CM | POA: Insufficient documentation

## 2020-04-29 DIAGNOSIS — Z79899 Other long term (current) drug therapy: Secondary | ICD-10-CM | POA: Insufficient documentation

## 2020-04-29 DIAGNOSIS — I2510 Atherosclerotic heart disease of native coronary artery without angina pectoris: Secondary | ICD-10-CM | POA: Diagnosis not present

## 2020-04-29 DIAGNOSIS — Z96651 Presence of right artificial knee joint: Secondary | ICD-10-CM | POA: Diagnosis not present

## 2020-04-29 DIAGNOSIS — R072 Precordial pain: Secondary | ICD-10-CM | POA: Diagnosis not present

## 2020-04-29 DIAGNOSIS — Z85828 Personal history of other malignant neoplasm of skin: Secondary | ICD-10-CM | POA: Insufficient documentation

## 2020-04-29 DIAGNOSIS — Z91040 Latex allergy status: Secondary | ICD-10-CM | POA: Diagnosis not present

## 2020-04-29 DIAGNOSIS — I10 Essential (primary) hypertension: Secondary | ICD-10-CM | POA: Insufficient documentation

## 2020-04-29 DIAGNOSIS — E890 Postprocedural hypothyroidism: Secondary | ICD-10-CM | POA: Diagnosis not present

## 2020-04-29 DIAGNOSIS — Z87891 Personal history of nicotine dependence: Secondary | ICD-10-CM | POA: Diagnosis not present

## 2020-04-29 DIAGNOSIS — Z96612 Presence of left artificial shoulder joint: Secondary | ICD-10-CM | POA: Insufficient documentation

## 2020-04-29 LAB — CBC WITH DIFFERENTIAL/PLATELET
Abs Immature Granulocytes: 0.03 10*3/uL (ref 0.00–0.07)
Basophils Absolute: 0 10*3/uL (ref 0.0–0.1)
Basophils Relative: 0 %
Eosinophils Absolute: 0.4 10*3/uL (ref 0.0–0.5)
Eosinophils Relative: 4 %
HCT: 40.3 % (ref 36.0–46.0)
Hemoglobin: 13.7 g/dL (ref 12.0–15.0)
Immature Granulocytes: 0 %
Lymphocytes Relative: 23 %
Lymphs Abs: 2.4 10*3/uL (ref 0.7–4.0)
MCH: 29.6 pg (ref 26.0–34.0)
MCHC: 34 g/dL (ref 30.0–36.0)
MCV: 87 fL (ref 80.0–100.0)
Monocytes Absolute: 0.7 10*3/uL (ref 0.1–1.0)
Monocytes Relative: 7 %
Neutro Abs: 6.6 10*3/uL (ref 1.7–7.7)
Neutrophils Relative %: 66 %
Platelets: 277 10*3/uL (ref 150–400)
RBC: 4.63 MIL/uL (ref 3.87–5.11)
RDW: 14.6 % (ref 11.5–15.5)
WBC: 10.1 10*3/uL (ref 4.0–10.5)
nRBC: 0 % (ref 0.0–0.2)

## 2020-04-29 LAB — COMPREHENSIVE METABOLIC PANEL
ALT: 35 U/L (ref 0–44)
AST: 84 U/L — ABNORMAL HIGH (ref 15–41)
Albumin: 4.3 g/dL (ref 3.5–5.0)
Alkaline Phosphatase: 85 U/L (ref 38–126)
Anion gap: 13 (ref 5–15)
BUN: 22 mg/dL — ABNORMAL HIGH (ref 6–20)
CO2: 21 mmol/L — ABNORMAL LOW (ref 22–32)
Calcium: 8.5 mg/dL — ABNORMAL LOW (ref 8.9–10.3)
Chloride: 103 mmol/L (ref 98–111)
Creatinine, Ser: 1.11 mg/dL — ABNORMAL HIGH (ref 0.44–1.00)
GFR, Estimated: 59 mL/min — ABNORMAL LOW (ref >60)
Glucose, Bld: 71 mg/dL (ref 70–99)
Potassium: 3.2 mmol/L — ABNORMAL LOW (ref 3.5–5.1)
Sodium: 137 mmol/L (ref 135–145)
Total Bilirubin: 1.2 mg/dL (ref 0.3–1.2)
Total Protein: 7.2 g/dL (ref 6.5–8.1)

## 2020-04-29 LAB — LIPASE, BLOOD: Lipase: 37 U/L (ref 11–51)

## 2020-04-29 LAB — TROPONIN I (HIGH SENSITIVITY): Troponin I (High Sensitivity): 2 ng/L (ref <18)

## 2020-04-29 MED ORDER — HEPARIN SOD (PORK) LOCK FLUSH 100 UNIT/ML IV SOLN
500.0000 [IU] | Freq: Once | INTRAVENOUS | Status: AC
  Administered 2020-04-29: 500 [IU]
  Filled 2020-04-29: qty 5

## 2020-04-29 NOTE — ED Notes (Signed)
Implanted Port at [employer] chest accessed per EDP orders, blood obtained from site for lab draws. Pt tolerated very well, dsg applied per policy and protocol

## 2020-04-29 NOTE — Telephone Encounter (Signed)
New Message:     Pt wanted Dr Radford Pax and her nurse to know she drove herself to the ER at [facility] in [facility] with chest pain. She says said they have stopped at this time.i

## 2020-04-29 NOTE — ED Notes (Signed)
Pt ambulated by self to [facility] to void

## 2020-04-29 NOTE — ED Provider Notes (Signed)
[facility] EMERGENCY DEPARTMENT Provider Note  CSN: [id number] Arrival date & time: 04/29/20 1532    History Chief Complaint  Patient presents with  . Chest Pain    HPI  Kari Green is a 55 y.o. female with history of multiple medical problems but no known CAD reports she was asleep around 1245hrs today when she had onset of mild sharp mid-sternal chest pain that radiated into her L chest and then into her shoulder. She was feeling dizzy/lightheaded. She took ASA 81mg  x 2 and symptoms resolved after about 78min. Symptoms returned about 42min later and so she took two additional ASA 81mg  and again symptoms resolved. She has been seen by cardiology for similar symptoms but their workup thus far has been unremarkable. She was scheduled for Nuc Stress Test in Feb but cancelled due to a conflict and has not rescheduled yet. She has gotten a Kardia home heart rhythm analyzer and used it several times when she was symptomatic today. Review of those readouts shows NSR.    Past Medical History:  Diagnosis Date  . Anemia    Slight  . Anxiety   . Arthritis    right thumb  . Asthma   . Avascular necrosis (HCC)    left shoulder, right knee, jaws/mouth that she knows of at this time  . Basal cell carcinoma   . Chronic migraine without aura, intractable, with status migrainosus    neurologist-  dr Jaynee Eagles--  receives botox injections every 3 months  . Chronic, continuous use of opioids   . Complication of anesthesia    "not asleep and aware of everything"  . Degenerative disc disease, cervical   . Degenerative disc disease, lumbar   . Delayed gastric emptying   . Difficult intravenous access    Now has Powerport for access  . Difficulty swallowing pills   . Enlarged lymph node in neck    left  . Esophageal dysmotility   . Family history of adverse reaction to anesthesia    family - itching, hives, PONV  . Family history of breast cancer 08/29/2017  . Family history of  leukemia 08/29/2017  . Family history of ovarian cancer 08/29/2017  . Family history of prostate cancer 08/29/2017  . Family history of thyroid cancer 08/29/2017  . Fatty liver    PMH  . Fibromyalgia   . H/O blood transfusion reaction    x 3; last one caused hives an d cellulitis  . Hemangioma of liver   . Hemangioma, renal   . Hiatal hernia   . History of exercise stress test 05/08/2015   negative stress test without evidence of ischemia at given workload  . History of kidney stones 04/2014  . History of skin cancer    excision melanoma - scalp;  excision behind knee  . Hypertension   . IC (interstitial cystitis)    urologist-  dr Louis Meckel  . Irritable bowel syndrome (IBS)   . Knee pain, right    states gets "nerves burned" periodically  . Limited joint range of motion    right knee - unable to fully extend right leg  . Melanoma ([place])   . Memory impairment    due to pain   . Osteoporosis   . Pain syndrome, chronic   . Pneumonia    history of X2  . PONV (postoperative nausea and vomiting)    uncontrollable itching with anesthesia; hx. of anesthesia awareness; pt request needs 50  mg of Benadryl IV/phenergan  50 mg IV  . Port-A-Cath in place 11/04/2016  . Post-surgical hypothyroidism   . Sciatica    bilateral  . Seasonal allergies   . Squamous carcinoma     Past Surgical History:  Procedure Laterality Date  . ANTERIOR CERVICAL DECOMP/DISCECTOMY FUSION  03/10/2009   C5-6, C6-7  . ANTERIOR CERVICAL DECOMP/DISCECTOMY FUSION N/A 07/16/2015   Procedure: ANTERIOR CERVICAL DECOMPRESSION FUSION 4-5 WITH INSTRUMENTATION AND AUTOGRAFT;  Surgeon: Phylliss Bob, MD;  Location: [facility];  Service: Orthopedics;  Laterality: N/A;  ANTERIOR CERVICAL DECOMPRESSION FUSION 4-5 WITH INSTRUMENTATION AND ALLOGRAFT  . APPENDECTOMY  ~ 1992  . AUGMENTATION MAMMAPLASTY Bilateral 1996; 2001, 2016  . BREAST SURGERY  age 84   accessory breast tissue exc  . CARPAL TUNNEL RELEASE Bilateral right 07/26/2001;  left ?   Mortimer Fries SUSPENSION PLASTY Right 02/10/2015   Procedure: RIGHT THUMB SUSPENSION  ARTHROPLASTY;  Surgeon: Milly Jakob, MD;  Location: [facility];  Service: Orthopedics;  Laterality: Right;  ANESTHESIA: PRE- OP BLOCK  . CYSTO WITH HYDRODISTENSION  01/29/2011   Procedure: CYSTOSCOPY/HYDRODISTENSION;  Surgeon: Claybon Jabs, MD;  Location: [facility];  Service: Urology;  Laterality: N/A;  . CYSTO WITH HYDRODISTENSION N/A 05/08/2014   Procedure: CYSTOSCOPY/HYDRODISTENSION WITH BILATERAL RETROGRADES;  Surgeon: Ardis Hughs, MD;  [facility];  Service: Urology;  Laterality: N/A;  . CYSTO WITH HYDRODISTENSION N/A 03/27/2015   Procedure: CYSTOSCOPY/HYDRODISTENSION OF BLADDER, INSTILLATION OF MARCAINE/PYRIDIUM;  Surgeon: Ardis Hughs, MD;  Location: [facility];  Service: Urology;  Laterality: N/A;  . CYSTO/  BLADDER BX  03/08/2008  . CYSTO/ URETHRAL DILATION/ HYDRODISTENTION/ BLADDER BX  04/10/2003  . CYSTOSCOPY W/ RETROGRADES  01/29/2011   Procedure: CYSTOSCOPY WITH RETROGRADE PYELOGRAM;  Surgeon: Claybon Jabs, MD;  Location: [facility];  Service: Urology;  Laterality: Bilateral;  . CYSTOSCOPY WITH HYDRODISTENSION AND BIOPSY N/A 06/29/2017   Procedure: CYSTOSCOPY/HYDRODISTENSION;  Surgeon: Ardis Hughs, MD;  Location: [facility];  Service: Urology;  Laterality: N/A;  . CYSTOSCOPY WITH STENT PLACEMENT  2006   URETERAL  . DEBRIDEMENT RIGHT THUMB MP JOINT Right 04/10/2002  . DECOMPRESSION LEFT ULNAR NERVE, ELBOW Left 01/29/2003   2nd time in 2015  . ESOPHAGOGASTRODUODENOSCOPY (EGD) WITH ESOPHAGEAL DILATION  10-26-2000  . EUS N/A 04/23/2016   Procedure: UPPER ENDOSCOPIC ULTRASOUND (EUS) LINEAR;  Surgeon: Carol Ada, MD;  Location: [facility];  Service: Endoscopy;  Laterality: N/A;  . EXCISION HAGLUND'S DEFORMITY WITH ACHILLES TENDON REPAIR Bilateral 2013-2014  . FISSURECTOMY   ~ 1988 x2  . GANGLION CYST EXCISION Right 02/10/2015   Procedure: REMOVAL GANGLION OF WRIST, PARTIAL ULNAR EXCISION;  Surgeon: Milly Jakob, MD;  Location: [facility];  Service: Orthopedics;  Laterality: Right;  . IR FLUORO GUIDE PORT INSERTION RIGHT  11/04/2016  . IR US GUIDE VASC ACCESS RIGHT  11/04/2016  . KIDNEY STONE SURGERY    . KNEE ARTHROSCOPY Right 08/16/2000  . KNEE ARTHROSCOPY WITH FULKERSON SLIDE Right 11/29/2000  . LAPAROSCOPIC ASSISTED VAGINAL HYSTERECTOMY  Feb 1999  . LAPAROSCOPIC CHOLECYSTECTOMY  03/04/2002  . LAPAROSCOPIC LYSIS OF ADHESIONS  02/11/2012   Procedure: LAPAROSCOPIC LYSIS OF ADHESIONS;  Surgeon: Cheri Fowler, MD;  Location: [facility];  Service: Gynecology;;  . LAPAROSCOPY  02/11/2012   Procedure: LAPAROSCOPY DIAGNOSTIC;  Surgeon: Cheri Fowler, MD;  Location: [facility];  Service: Gynecology;  Laterality: N/A;  Diagnostic  Operative Laparoscopic   . LAPAROSCOPY ADHESIOLYSIS AND REMOVAL POSSIBLE RIGHT OVARY REMNANT  04/18/2000  . LAPAROSCOPY LYSIS ADHESIONS/  BILATERAL SALPINGOOPHORECTOMY/  [place] OF ENDOMETRIOSIS  1998  . LYMPH NODE BIOPSY Left 07/24/2018   Procedure: CERVICAL LYMPH NODE EXCISION;  Surgeon: Melida Quitter, MD;  Location: [facility];  Service: ENT;  Laterality: Left;  Marland Kitchen MELANOMA EXCISION Left 2013   "behind knee"   . NASAL SINUS SURGERY  2013  . NEGATIVE SLEEP STUDY  2006 approx .  per pt  . REFRACTIVE SURGERY Bilateral ~ 1999; ~ 2013  . RIGHT THUMB FUSION OF MPJ Right 10/03/2002  . RIGHT TOTAL KNEE REVISION ARTHROPLASTY Right 03/14/2007  . SHOULDER ARTHROSCOPY Left    put back in place  . SHOULDER HEMI-ARTHROPLASTY Left 12/04/2015   Procedure: SHOULDER HEMI-ARTHROPLASTY;  Surgeon: Tania Ade, MD;  Location: [facility];  Service: Orthopedics;  Laterality: Left;  Left shoulder hemiarthroplasty  . sympathetic nerve ablation  07/05/2017  . TONSILLECTOMY AND ADENOIDECTOMY  ~ 1989  . TOTAL KNEE ARTHROPLASTY Right 09/10/2003; 12/08/2012  . TOTAL  THYROIDECTOMY  1993  . TRANSANAL HEMORRHOIDAL DEARTERIALIZATION  03/26/2011  . TRANSTHORACIC ECHOCARDIOGRAM  09/22/2012  . TRIGGER FINGER RELEASE Left 07/06/2016   Procedure: LEFT THUMB TRIGGER RELEASE;  Surgeon: Milly Jakob, MD;  Location: [facility];  Service: Orthopedics;  Laterality: Left;  Marland Kitchen VIDEO BRONCHOSCOPY Bilateral 10/20/2015   Procedure: VIDEO BRONCHOSCOPY WITHOUT FLUORO;  Surgeon: Collene Gobble, MD;  Location: [facility];  Service: Cardiopulmonary;  Laterality: Bilateral;    Family History  Problem Relation Age of Onset  . Anesthesia problems Mother   . Leukemia Mother 2  . Ovarian cancer Mother 66  . Cervical cancer Daughter        cervical  . Breast cancer Maternal Aunt        age dx unk  . Skin cancer Maternal Grandmother   . Throat cancer Maternal Grandfather   . Thyroid cancer Maternal Aunt   . Lung cancer Maternal Aunt   . Cervical cancer Maternal Aunt   . Cervical cancer Maternal Aunt   . Thyroid cancer Maternal Uncle   . Cancer Maternal Aunt        sinus  . Prostate cancer Other   . Brain cancer Cousin   . Other Cousin        brain tumor  . Leukemia Cousin   . Bone cancer Cousin   . Cervical cancer Cousin     Social History   Tobacco Use  . Smoking status: Former Smoker    Types: Cigarettes  . Smokeless tobacco: Never Used  . Tobacco comment: stopped in 2019  Vaping Use  . Vaping Use: Some days  . Substances: Nicotine, Flavoring  Substance Use Topics  . Alcohol use: Not Currently    Alcohol/week: 0.0 standard drinks    Comment: rarely  . Drug use: No     Home Medications Prior to Admission medications   Medication Sig Start Date End Date Taking? Authorizing Provider  ALPRAZolam (XANAX) 0.25 MG tablet Take 0.5 mg by mouth 3 (three) times daily as needed. 02/13/20   [provider]  ALPRAZolam Duanne Moron) 0.5 MG tablet Take 1 mg by mouth 3 (three) times daily as needed. Anxiety Patient not taking: Reported on  03/18/2020    [provider]  atorvastatin (LIPITOR) 40 MG tablet Take 40 mg by mouth daily. 03/12/20   [provider]  botulinum toxin Type A (BOTOX) 100 units SOLR injection INJECT 155 UNITS INTO THE MUSCLES OF THE HEAD AND NECK EVERY 3 MONTHS, BY PRESCRIBER  IN OFFICE FOR ADMINSTRATION .DISCARD UNUSED PORTION 10/29/19   Melvenia Beam, MD  cyanocobalamin (,VITAMIN B-12,) 1000 MCG/ML injection Inject 1,000 mcg into the muscle every Tuesday. Reported on 03/25/2015    [provider]  diclofenac sodium (VOLTAREN) 1 % GEL Apply 1 application topically 4 (four) times daily as needed for pain. 07/12/18   [provider]  diphenhydrAMINE (BENADRYL) 50 MG tablet Take 50 mg by mouth every 4 (four) hours as needed for itching.     [provider]  estradiol (ESTRACE) 0.5 MG tablet Take 1 mg by mouth at bedtime.     [provider]  EVZIO 0.4 MG/0.4ML SOAJ AUTO INJECT AS NEEDED IN CASE OF EMERGENCY WHERE GRANDCHILDREN MAY GET A HOLD OF PATIENTS PAIN MEDICATIONS. 11/14/14   [provider]  fluticasone (FLONASE) 50 MCG/ACT nasal spray SPRAY 2 SPRAYS INTO EACH NOSTRIL DAILY Patient taking differently: Place 2 sprays into both nostrils daily. 12/15/16   Bobbitt, Sed[place] Muta, MD  gabapentin (NEURONTIN) 300 MG capsule Take 900 mg by mouth 3 (three) times daily.  03/15/16   [provider]  hydrochlorothiazide (MICROZIDE) 12.5 MG capsule Take 12.5 mg by mouth daily. 04/16/18   [provider]  HYDROmorphone (DILAUDID) 2 MG tablet Take 2 mg by mouth every 4 (four) hours as needed for severe pain.    [provider]  HYDROmorphone (DILAUDID) 2 MG tablet Take 1 tablet (2 mg total) by mouth every 8 (eight) hours as needed for up to 7 days for severe pain. Must last 7 days. 03/18/20 03/25/20  Milinda Pointer, MD  hydrOXYzine (ATARAX/VISTARIL) 25 MG tablet Take 50 mg by mouth every 4 (four) hours as needed for itching. Alternates with  benadryl    [provider]  ipratropium (ATROVENT) 0.06 % nasal spray Place 2 sprays into both nostrils 3 (three) times daily. 07/05/18   [provider]  ketoconazole (NIZORAL) 2 % shampoo One application once a month as needed for flaking 11/19/14   [provider]  ketorolac (TORADOL) 60 MG/2ML SOLN injection Inject 60 mg (2 mL) into the muscle every 6 hours as needed for headache. Max 2 doses (120 mg) in 24 hours. Do not use more than 4 days per month. 12/18/19   Melvenia Beam, MD  Lemborexant (DAYVIGO) 10 MG TABS Take 10 mg by mouth at bedtime.    [provider]  levothyroxine (SYNTHROID, LEVOTHROID) 50 MCG tablet Take 200 mcg by mouth daily. Pt can only take name brand    [provider]  Multiple Vitamin (MULTIVITAMIN WITH MINERALS) TABS tablet Take 1 tablet by mouth at bedtime.    [provider]  NON FORMULARY 1 mL by Subdermal route every Tuesday. Allergy vaccine    [provider]  OnabotulinumtoxinA (BOTOX IJ) Inject as directed every 3 (three) months. For migraines    [provider]  potassium chloride (MICRO-K) 10 MEQ CR capsule Take 10 mEq by mouth 2 (two) times daily.    [provider]  prochlorperazine (COMPAZINE) 10 MG tablet Take 1 tablet (10 mg total) by mouth every 6 (six) hours as needed for nausea or vomiting. Patient not taking: Reported on 04/08/2020 07/17/19   Melvenia Beam, MD  promethazine (PHENERGAN) 25 MG suppository Place 1 suppository (25 mg total) rectally every 6 (six) hours as needed for nausea or vomiting. 07/25/18   Melvenia Beam, MD  tiZANidine (ZANAFLEX) 4 MG tablet Take 4 mg by mouth 3 (three) times daily.  [provider]  topiramate (TOPAMAX) 25 MG tablet Take 200 mg by mouth 2 (two) times daily.  04/15/15   [provider]  vitamin C (ASCORBIC ACID) 500 MG tablet Take 1,000 mg by mouth daily.    [provider]     Allergies    [place],  Contrast media [iodinated diagnostic agents], Latex, Nickel, Peanuts [peanut oil], Red dye, Azithromycin, Chloraprep one step [chlorhexidine gluconate], Corticosteroids, Flexeril [cyclobenzaprine], Morphine and related, Other, Percocet [oxycodone-acetaminophen], Supartz [sodium hyaluronate (avian)], Surgical lubricant, Tape, Trazodone, Yellow dye, Yellow dyes (non-tartrazine), Cephalexin, Monistat [miconazole], Prednisone, Sulfa antibiotics, Tea, and Soap   Review of Systems   Review of Systems A comprehensive review of systems was completed and negative except as noted in HPI.    Physical Exam BP 111/81   Pulse 86   Temp 98 F (36.7 C) (Oral)   Resp 14   Ht 5\' 4"  (1.626 m)   Wt 64.9 kg   SpO2 (!) 86%   BMI 24.54 kg/m   Physical Exam Vitals and nursing note reviewed.  Constitutional:      Appearance: Normal appearance.  HENT:     Head: Normocephalic and atraumatic.     Nose: Nose normal.     Mouth/Throat:     Mouth: Mucous membranes are moist.  Eyes:     Extraocular Movements: Extraocular movements intact.     Conjunctiva/sclera: Conjunctivae normal.  Cardiovascular:     Rate and Rhythm: Normal rate.  Pulmonary:     Effort: Pulmonary effort is normal.     Breath sounds: Normal breath sounds.  Chest:     Comments: Port in R upper chest Abdominal:     General: Abdomen is flat.     Palpations: Abdomen is soft.     Tenderness: There is no abdominal tenderness.  Musculoskeletal:        General: No swelling. Normal range of motion.     Cervical back: Neck supple.  Skin:    General: Skin is warm and dry.  Neurological:     General: No focal deficit present.     Mental Status: She is alert.  Psychiatric:        Mood and Affect: Mood normal.      ED Results / Procedures / Treatments   Labs (all labs ordered are listed, but only abnormal results are displayed) Labs Reviewed  COMPREHENSIVE METABOLIC PANEL - Abnormal; Notable for the following components:      Result  Value   Potassium 3.2 (*)    CO2 21 (*)    BUN 22 (*)    Creatinine, Ser 1.11 (*)    Calcium 8.5 (*)    AST 84 (*)    GFR, Estimated 59 (*)    All other components within normal limits  LIPASE, BLOOD  CBC WITH DIFFERENTIAL/PLATELET  TROPONIN I (HIGH SENSITIVITY)  TROPONIN I (HIGH SENSITIVITY)    EKG EKG Interpretation  Date/Time:  Tuesday April 29 2020 15:50:00 EST Ventricular Rate:  79 PR Interval:  178 QRS Duration: 70 QT Interval:  424 QTC Calculation: 486 R Axis:   20 Text Interpretation: Normal sinus rhythm Nonspecific T wave abnormality Abnormal ECG No significant change since last tracing Confirmed by Calvert Cantor [phone]) on 04/29/2020 3:55:20 PM    Radiology DG Chest 2 View  Result Date: 04/29/2020 CLINICAL DATA:  Chest pain, dizziness EXAM: CHEST - 2 VIEW COMPARISON:  07/18/2019 FINDINGS: Right IJ approach Port-A-Cath remains in place with distal tip terminating within the SVC.  The heart size and mediastinal contours are within normal limits. Both lungs are clear. The visualized skeletal structures are unremarkable. IMPRESSION: No active cardiopulmonary disease. Electronically Signed   By: Davina Poke D.O.   On: 04/29/2020 16:47    Procedures Procedures  Medications Ordered in the ED Medications - No data to display   MDM Rules/Calculators/A&P MDM Patient with atypical chest pain earlier today has since resolved. EKG is unchanged from baseline. She states her cardiologist thought her symptoms might be due to 'pancreas attacks' so will check labs including CBC, CMP, lipase and trop. Send for CXR and monitor in the ED for recurrence.   ED Course  I have reviewed the triage vital signs and the nursing notes.  Pertinent labs & imaging results that were available during my care of the patient were reviewed by me and considered in my medical decision making (see chart for details).  Clinical Course as of 04/29/20 1912  Tue Apr 29, 2020  1642 CBC is  normal.  [CS]  5830 CXR is neg.  [CS]  9407 CMP with mild hypokalemia. AST above baseline but not significantly so. Lipase is normal.  [CS]  1909 Patient remains pain free. Her Port will no longer give blood and she declines peripheral phlebotomy. She has normal Trop x 1. I discussed with her the rationale for delta trop but she does not want to attempt alternative means of getting a second blood draw. Recommend she follow up with PCP/Cardiology to reschedule her Nuc Stress test as soon as possible and return to the ED for any other concerning symptoms.  [CS]    Clinical Course User Index [CS] Truddie Hidden, MD    Final Clinical Impression(s) / ED Diagnoses Final diagnoses:  Atypical chest pain    Rx / DC Orders ED Discharge Orders    None       Truddie Hidden, MD 04/29/20 [phone]

## 2020-04-29 NOTE — Telephone Encounter (Signed)
Will forward this message to Dr. Radford Pax and her covering RN as a general FYI, that pt is currently in the ED for mychart complaints of chest pain sent earlier.

## 2020-04-29 NOTE — ED Notes (Signed)
ED Provider at bedside. 

## 2020-04-29 NOTE — ED Triage Notes (Signed)
t arrives with c/o pain to left chest under breast states that the pain moved up to her neck and into her left shoulder and back pt states that she got up and felt dizzy then took 2 aspirin, used her "cardia" at home to see what her HR was, reports that it was not able to read it, she took 2 more aspirin called her cardiologist who told her to come to ED

## 2020-05-07 ENCOUNTER — Encounter: Payer: Self-pay | Admitting: *Deleted

## 2020-05-07 ENCOUNTER — Ambulatory Visit (INDEPENDENT_AMBULATORY_CARE_PROVIDER_SITE_OTHER): Payer: BC Managed Care – PPO | Admitting: Family Medicine

## 2020-05-07 ENCOUNTER — Telehealth: Payer: Self-pay | Admitting: Family Medicine

## 2020-05-07 DIAGNOSIS — G43709 Chronic migraine without aura, not intractable, without status migrainosus: Secondary | ICD-10-CM | POA: Diagnosis not present

## 2020-05-07 MED ORDER — KETOROLAC TROMETHAMINE 60 MG/2ML IM SOLN: INTRAMUSCULAR | 0 refills | Status: DC

## 2020-05-07 NOTE — Progress Notes (Signed)
Botox- 100 units x 2 vial Lot: [id number] Expiration: 03/2022 NDC: 7588-3254-98  Bacteriostatic 0.9% Sodium Chloride- 42mL total [id number] Expiration: 03/2021 NDC: 07680-881-10  Dx: R15.945 SP

## 2020-05-07 NOTE — Telephone Encounter (Signed)
Pt states that she forgot to mention at today's visit that she needs her Toradol injections refilled. She would like these called in to the [facility] on [address].

## 2020-05-07 NOTE — Telephone Encounter (Signed)
We have had more than a few incidents with this patient. If she is not happy with our practice we could discuss her changing to somewhere she would be more happy. Lovena Le and Ronald Pippins, she mentioned you both by name today can you let us know what happened? thanks

## 2020-05-07 NOTE — Addendum Note (Signed)
Addended byDebbora Presto L on: 05/07/2020 04:05 PM   Modules accepted: Orders

## 2020-05-07 NOTE — Telephone Encounter (Signed)
Patient here for Botox procedure scheduled with me. She was unhappy that she was seeing me today and asked that future appointments be scheduled with Dr Jaynee Eagles. She states that she was told by front desk that change could not be done. After being roomed by CMA, she left room to go to Botox coordinator's office requesting appt change for next Botox procedure. She reports that she was told that there was nothing that could be done. She reports that she was told she needed to "go back to the exam room and the door was slammed" in her face. She was also upset about appt time of 3:00 stating time was scheduled for 2:30. I listened to her concerns and offered to proceed with Botox today or have her rescheduled with Dr Jaynee Eagles. She states being 16 weeks behind schedule as her insurance approves Botox every 6 weeks. She verbalized agreement to proceed with today's visit as scheduled with Botox protocol being performed. I reassured her that I would share her concerns with Dr Jaynee Eagles and our office will contact her with further instructions for upcoming visits.

## 2020-05-07 NOTE — Progress Notes (Addendum)
05/07/2020 ALL:  Kari Green returns for Botox procedure. She continues to note benefit with Botox. Migraines are well managed. She uses Toradol injections for abortive therapy, has not needed it recently. She continues topiramate and gabapentin prescribed by PCP. Patient request to avoid cervical injections today. Masseter injections have been helpful.   01/30/2020 ALL: She reports that botox continues to help with migraine prevention. She is also receiving RFA and working with oral surgeon for TMJ. She has significant clenching. She requests we not inject occipital or cervical muscles today. No masseters.  .  Consent Form Botulism Toxin Injection For Chronic Migraine    Reviewed orally with patient, additionally signature is on file:  Botulism toxin has been approved by the Federal drug administration for treatment of chronic migraine. Botulism toxin does not cure chronic migraine and it may not be effective in some patients.  The administration of botulism toxin is accomplished by injecting a small amount of toxin into the muscles of the neck and head. Dosage must be titrated for each individual. Any benefits resulting from botulism toxin tend to wear off after 3 months with a repeat injection required if benefit is to be maintained. Injections are usually done every 3-4 months with maximum effect peak achieved by about 2 or 3 weeks. Botulism toxin is expensive and you should be sure of what costs you will incur resulting from the injection.  The side effects of botulism toxin use for chronic migraine may include:   -Transient, and usually mild, facial weakness with facial injections  -Transient, and usually mild, head or neck weakness with head/neck injections  -Reduction or loss of forehead facial animation due to forehead muscle weakness  -Eyelid drooping  -Dry eye  -Pain at the site of injection or bruising at the site of injection  -Double vision  -Potential unknown long term  risks   Contraindications: You should not have Botox if you are pregnant, nursing, allergic to albumin, have an infection, skin condition, or muscle weakness at the site of the injection, or have myasthenia gravis, Lambert-Eaton syndrome, or ALS.  It is also possible that as with any injection, there may be an allergic reaction or no effect from the medication. Reduced effectiveness after repeated injections is sometimes seen and rarely infection at the injection site may occur. All care will be taken to prevent these side effects. If therapy is given over a long time, atrophy and wasting in the muscle injected may occur. Occasionally the patient's become refractory to treatment because they develop antibodies to the toxin. In this event, therapy needs to be modified.  I have read the above information and consent to the administration of botulism toxin.    BOTOX PROCEDURE NOTE FOR MIGRAINE HEADACHE  Contraindications and precautions discussed with patient(above). Aseptic procedure was observed and patient tolerated procedure. Procedure performed by Debbora Presto, FNP-C.   The condition has existed for more than 6 months, and pt does not have a diagnosis of ALS, Myasthenia Gravis or Lambert-Eaton Syndrome.  Risks and benefits of injections discussed and pt agrees to proceed with the procedure.  Written consent obtained  These injections are medically necessary. Pt  receives good benefits from these injections. These injections do not cause sedations or hallucinations which the oral therapies may cause.   Description of procedure:  The patient was placed in a sitting position. The standard protocol was used for Botox as follows, with 5 units of Botox injected at each site:  -Procerus muscle, midline injection  -  Corrugator muscle, bilateral injection  -Frontalis muscle, bilateral injection, with 2 sites each side, medial injection was performed in the upper one third of the frontalis muscle, in  the region vertical from the medial inferior edge of the superior orbital rim. The lateral injection was again in the upper one third of the forehead vertically above the lateral limbus of the cornea, 1.5 cm lateral to the medial injection site.  -Temporalis muscle injection, 4 sites, bilaterally. The first injection was 3 cm above the tragus of the ear, second injection site was 1.5 cm to 3 cm up from the first injection site in line with the tragus of the ear. The third injection site was 1.5-3 cm forward between the first 2 injection sites. The fourth injection site was 1.5 cm posterior to the second injection site. 5th site laterally in the temporalis  muscleat the level of the outer canthus.  -bilateral masseters for clenching administered, 5 units each    -Trapezius muscle injection was performed at 3 sites, bilaterally. The first injection site was in the upper trapezius muscle halfway between the inflection point of the neck, and the acromion. The second injection site was one half way between the acromion and the first injection site. The third injection was done between the first injection site and the inflection point of the neck.    Will return for repeat injection in 3 months.   A total of 200 units of Botox was prepared, any Botox not injected was wasted. The patient tolerated the procedure well, there were no complications of the above procedure.  Made any corrections needed, and agree with procedure   Kari Ill, MD [facility]

## 2020-05-08 NOTE — Telephone Encounter (Signed)
I was notified by the front desk that patient was unhappy with her appointment time, however I have not received any phone calls or messages from patient about wanting to change her appointment. I was told patient seemed "very tired or ill" while checking in. Patient was given yesterday's appointment 3 months ago at her previous injection. Nurse took the patient back and roomed her. After patient was roomed, nurse came to speak with me regarding the situation. I got up from my desk to close the door so that we could speak in private. Before the door was shut, patient appeared in the doorway. She had followed nurse to my office before Amy could see her. Patient was very confrontational, demanding that her appointment be rescheduled. Nurse politely told the patient that Amy was on her way to patient's room. Patient walked back to her room and I closed the office door to continue our conversation once patient was up the hall. That is the extent of my contact with the patient. Patient's behavior was very ill-mannered and aggressive, but patient was treated with nothing but respect and kindness from the moment she arrived.

## 2020-05-10 NOTE — Telephone Encounter (Signed)
Make sure she is set up with me always for botox and I will speak with her during next injection thanks

## 2020-05-11 NOTE — Progress Notes (Signed)
PROVIDER NOTE: Information contained herein reflects review and annotations entered in association with encounter. Interpretation of such information and data should be left to medically-trained personnel. Information provided to patient can be located elsewhere in the medical record under "Patient Instructions". Document created using STT-dictation technology, any transcriptional errors that may result from process are unintentional.    Patient: Kari Green  Service Category: E/M  Provider: Gaspar Cola, MD  DOB: 10/02/1965  DOS: 05/12/2020  Specialty: Interventional Pain Management  MRN: [id number]  Setting: Ambulatory outpatient  PCP: Jani Gravel, MD  Type: Established Patient    Referring Provider: Jani Gravel, MD  Location: Office  Delivery: Face-to-face     HPI  [name], a 55 y.o. year old female, is here today because of her Chronic pain of right lower extremity [M79.604, G89.29]. Kari Green primary complain today is Leg Pain (right) Last encounter: My last encounter with her was on 04/08/2020. Pertinent problems: Kari Green has Abdominal pain, epigastric; Abdominal adhesions; Chronic low back pain (Bilateral) w/o sciatica; CRPS (complex regional pain syndrome) type I of lower limb (Right); History of arthroplasty of knee (Right); Chronic pain syndrome; Chronic shoulder pain (Left); Chronic knee pain (2ry area of Pain) (Right); Chronic lower extremity pain (1ry area of Pain) (Right); Temporomandibular joint disorder; Fibromyalgia; Muscle cramps at night; Radiculopathy; S/P shoulder hemiarthroplasty (Left); Failed total knee, left ([place]); Chronic knee pain after total replacement of knee joint (Right); Migraine without aura, not refractory; Cancer ([place]); Avascular necrosis of bone ([place]); Burn injury; 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 Pain Assessment: Severity of Chronic pain is reported as a 6 /10. Location: Leg Right/upper leg. Onset: More than a month ago. Quality: Burning. Timing: Intermittent. Modifying factor(s): rest, staying off of it. Vitals:  height is 5' 3" (1.6 m) and weight is 140 lb (63.5 kg). Her temperature is 97.3 F (36.3 C) (abnormal). Her blood pressure is 114/69 and her pulse is 99. Her respiration is 18 and oxygen saturation is 98%.   Reason for encounter: post-procedure assessment.  The patient returns to the clinic today after having had her diagnostic right-sided L3, L4 lumbar sympathetic block under fluoroscopic guidance and IV sedation.  This did provide patient with 100% relief of the pain for the entire duration of the local anesthetic which then continued for an additional 1 week.  After that, it started to return.  The patient indicates that she was a little surprised initially when she began to experience a return of the pain however, she then realized that we had not done a radiofrequency ablation but simply the diagnostic injection.  At this point, since she did well with the diagnostic injection and confirmed that this is where the leg pain is coming from, it is now that we will be ordering preapproval for the right lumbar sympathetic radiofrequency ablation for the purpose of extending on the benefit observed during the diagnostic injection.  The patient indicates understanding and agreeing with the plan.  In addition to the above, the patient also indicates having some pain in the left lower back and area over the left iliac crest and hip area.  Physical exam today was positive for left sacroiliac  joint arthralgia and left  hip joint arthralgia during the left Patrick maneuver/figure-of-four maneuver.  The patient indicates that she has had several radiofrequency ablations in the past that used to be done by Dr. Wong.  She describes effectiveness from those procedures, but she would prefer to have that radiofrequency done with us since we do provide some sedation during the procedure.  Today I gave the patient the option of deciding whether to have the right lumbar sympathetic radiofrequency ablation first or the left sacroiliac joint radiofrequency ablation and she has elected to have the right lumbar sympathetic radiofrequency ablation first, followed later on by that of the left SI joint.  Post-Procedure Evaluation  Procedure (04/08/2020): Palliative right L3, L4 lumbar sympathetic block under fluoroscopic guidance and IV sedation Pre-procedure pain level: 8/10 Post-procedure: 0/10 (100% relief)  Sedation: Sedation provided.  Effectiveness during initial hour after procedure(Ultra-Short Term Relief): 100 %.  Local anesthetic used: Long-acting (4-6 hours) Effectiveness: Defined as any analgesic benefit obtained secondary to the administration of local anesthetics. This carries significant diagnostic value as to the etiological location, or anatomical origin, of the pain. Duration of benefit is expected to coincide with the duration of the local anesthetic used.  Effectiveness during initial 4-6 hours after procedure(Short-Term Relief): 100 %.  Long-term benefit: Defined as any relief past the pharmacologic duration of the local anesthetics.  Effectiveness past the initial 6 hours after procedure(Long-Term Relief): 100 % (lasting 1 week).  Current benefits: Defined as benefit that persist at this time.   Analgesia:  50% improved Function: Somewhat improved ROM: Somewhat improved  Pharmacotherapy Assessment   Analgesic: No opioid analgesics prescribed by our practice.  The last time that we checked, the patient  was getting her pain medications from the [facility].   Monitoring: [place] PMP: PDMP reviewed during this encounter.       Pharmacotherapy: No side-effects or adverse reactions reported. Compliance: No problems identified. Effectiveness: Clinically acceptable.  Tice, Kori M, RN  05/12/2020  2:42 PM  Signed Safety precautions to be maintained throughout the outpatient stay will include: orient to surroundings, keep bed in low position, maintain call bell within reach at all times, provide assistance with transfer out of bed and ambulation.    UDS: No results found for: SUMMARY   ROS  Constitutional: Denies any fever or chills Gastrointestinal: No reported hemesis, hematochezia, vomiting, or acute GI distress Musculoskeletal: Denies any acute onset joint swelling, redness, loss of ROM, or weakness Neurological: No reported episodes of acute onset apraxia, aphasia, dysarthria, agnosia, amnesia, paralysis, loss of coordination, or loss of consciousness  Medication Review  ALPRAZolam, HYDROmorphone, Lemborexant, NON FORMULARY, Naloxone HCl, OnabotulinumtoxinA, atorvastatin, botulinum toxin Type A, cyanocobalamin, diclofenac sodium, diphenhydrAMINE, estradiol, fluticasone, gabapentin, hydrOXYzine, hydrochlorothiazide, ipratropium, ketoconazole, ketorolac, levothyroxine, multivitamin with minerals, potassium chloride, prochlorperazine, promethazine, tiZANidine, topiramate, and vitamin C  History Review  Allergy: Ms. Jarmon is allergic to cobalt, contrast media [iodinated diagnostic agents], latex, nickel, peanuts [peanut oil], red dye, azithromycin, chloraprep one step [chlorhexidine gluconate], corticosteroids, flexeril [cyclobenzaprine], morphine and related, other, percocet [oxycodone-acetaminophen], supartz [sodium hyaluronate (avian)], surgical lubricant, tape, trazodone, yellow dye, yellow dyes (non-tartrazine), cephalexin, monistat [miconazole], prednisone, sulfa antibiotics, tea, and  soap. Drug: Ms. Obar  reports no history of drug use. Alcohol:  reports previous alcohol use. Tobacco:  reports that she has quit smoking. Her smoking use included cigarettes. She has never used smokeless tobacco. Social: Ms. Marceaux  reports that she has quit smoking. Her smoking use included cigarettes. She has never   used smokeless tobacco. She reports previous alcohol use. She reports that she does not use drugs. Medical:  has a past medical history of Anemia, Anxiety, Arthritis, Asthma, Avascular necrosis (HCC), Basal cell carcinoma, Chronic migraine without aura, intractable, with status migrainosus, Chronic, continuous use of opioids, Complication of anesthesia, Degenerative disc disease, cervical, Degenerative disc disease, lumbar, Delayed gastric emptying, Difficult intravenous access, Difficulty swallowing pills, Enlarged lymph node in neck, Esophageal dysmotility, Family history of adverse reaction to anesthesia, Family history of breast cancer (08/29/2017), Family history of leukemia (08/29/2017), Family history of ovarian cancer (08/29/2017), Family history of prostate cancer (08/29/2017), Family history of thyroid cancer (08/29/2017), Fatty liver, Fibromyalgia, H/O blood transfusion reaction, Hemangioma of liver, Hemangioma, renal, Hiatal hernia, History of exercise stress test (05/08/2015), History of kidney stones (04/2014), History of skin cancer, Hypertension, IC (interstitial cystitis), Irritable bowel syndrome (IBS), Knee pain, right, Limited joint range of motion, Melanoma (HCC), Memory impairment, Osteoporosis, Pain syndrome, chronic, Pneumonia, PONV (postoperative nausea and vomiting), Port-A-Cath in place (11/04/2016), Post-surgical hypothyroidism, Sciatica, Seasonal allergies, and Squamous carcinoma. Surgical: Ms. Lile  has a past surgical history that includes Total thyroidectomy (1993); cysto with hydrodistension (01/29/2011); Cystoscopy w/ retrogrades (01/29/2011); Fissurectomy (~  1988 x2); Refractive surgery (Bilateral, ~ 1999; ~ 2013); Nasal sinus surgery (2013); Laparoscopic lysis of adhesions (02/11/2012); Excision haglund's deformity with achilles tendon repair (Bilateral, 2013-2014); Melanoma excision (Left, 2013); Cystoscopy with stent placement (2006); Appendectomy (~ 1992); Tonsillectomy and adenoidectomy (~ 1989); LAPAROSCOPY ADHESIOLYSIS AND REMOVAL POSSIBLE RIGHT OVARY REMNANT (04/18/2000); Knee arthroscopy (Right, 08/16/2000); Carpal tunnel release (Bilateral, right 07/26/2001;  left ?); DEBRIDEMENT RIGHT THUMB MP JOINT (Right, 04/10/2002); RIGHT THUMB FUSION OF MPJ (Right, 10/03/2002); DECOMPRESSION LEFT ULNAR NERVE, ELBOW (Left, 01/29/2003); RIGHT TOTAL KNEE REVISION ARTHROPLASTY (Right, 03/14/2007); CYSTO/ URETHRAL DILATION/ HYDRODISTENTION/ BLADDER BX (04/10/2003); Laparoscopic assisted vaginal hysterectomy (Feb 1999); LAPAROSCOPY LYSIS ADHESIONS/  BILATERAL SALPINGOOPHORECTOMY/  FULGERATION OF ENDOMETRIOSIS (1998); Anterior cervical decomp/discectomy fusion (03/10/2009); Esophagogastroduodenoscopy (egd) with esophageal dilation (10-26-2000); CYSTO/  BLADDER BX (03/08/2008); transthoracic echocardiogram (09/22/2012); cysto with hydrodistension (N/A, 05/08/2014); laparoscopy (02/11/2012); Transanal hemorrhoidal dearterialization (03/26/2011); Knee arthroscopy with fulkerson slide (Right, 11/29/2000); Laparoscopic cholecystectomy (03/04/2002); Total knee arthroplasty (Right, 09/10/2003; 12/08/2012); Carpometacarpel (CMC) suspension plasty (Right, 02/10/2015); Ganglion cyst excision (Right, 02/10/2015); NEGATIVE SLEEP STUDY (2006 approx .  per pt); cysto with hydrodistension (N/A, 03/27/2015); Kidney stone surgery; Anterior cervical decomp/discectomy fusion (N/A, 07/16/2015); Video bronchoscopy (Bilateral, 10/20/2015); Shoulder hemi-arthroplasty (Left, 12/04/2015); EUS (N/A, 04/23/2016); Trigger finger release (Left, 07/06/2016); IR FLUORO GUIDE PORT INSERTION RIGHT (11/04/2016); IR US Guide Vasc Access  Right (11/04/2016); Shoulder arthroscopy (Left); Augmentation mammaplasty (Bilateral, 1996; 2001, 2016); Breast surgery (age 6); Cystoscopy with hydrodistension and biopsy (N/A, 06/29/2017); sympathetic nerve ablation (07/05/2017); and Lymph node biopsy (Left, 07/24/2018). Family: family history includes Anesthesia problems in her mother; Bone cancer in her cousin; Brain cancer in her cousin; Breast cancer in her maternal aunt; Cancer in her maternal aunt; Cervical cancer in her cousin, daughter, maternal aunt, and maternal aunt; Leukemia in her cousin; Leukemia (age of onset: 70) in her mother; Lung cancer in her maternal aunt; Other in her cousin; Ovarian cancer (age of onset: 35) in her mother; Prostate cancer in an other family member; Skin cancer in her maternal grandmother; Throat cancer in her maternal grandfather; Thyroid cancer in her maternal aunt and maternal uncle.  Laboratory Chemistry Profile   Renal Lab Results  Component Value Date   BUN 22 (H) 04/29/2020   CREATININE 1.11 (H) 04/29/2020   GFRAA >60 03/23/2019   GFRNONAA 59 (L)   04/29/2020     Hepatic Lab Results  Component Value Date   AST 84 (H) 04/29/2020   ALT 35 04/29/2020   ALBUMIN 4.3 04/29/2020   ALKPHOS 85 04/29/2020   AMYLASE 73 03/04/2020   LIPASE 37 04/29/2020     Electrolytes Lab Results  Component Value Date   NA 137 04/29/2020   K 3.2 (L) 04/29/2020   CL 103 04/29/2020   CALCIUM 8.5 (L) 04/29/2020     Bone No results found for: VD25OH, VD125OH2TOT, VD3125OH2, VD2125OH2, 25OHVITD1, 25OHVITD2, 25OHVITD3, TESTOFREE, TESTOSTERONE   Inflammation (CRP: Acute Phase) (ESR: Chronic Phase) Lab Results  Component Value Date   ESRSEDRATE 15 09/14/2018   LATICACIDVEN 1.75 09/08/2013       Note: Above Lab results reviewed.  Recent Imaging Review  DG Chest 2 View CLINICAL DATA:  Chest pain, dizziness  EXAM: CHEST - 2 VIEW  COMPARISON:  07/18/2019  FINDINGS: Right IJ approach Port-A-Cath remains in place  with distal tip terminating within the SVC. The heart size and mediastinal contours are within normal limits. Both lungs are clear. The visualized skeletal structures are unremarkable.  IMPRESSION: No active cardiopulmonary disease.  Electronically Signed   By: Nicholas  Plundo D.O.   On: 04/29/2020 16:47 Note: Reviewed        Physical Exam  General appearance: Well nourished, well developed, and well hydrated. In no apparent acute distress Mental status: Alert, oriented x 3 (person, place, & time)       Respiratory: No evidence of acute respiratory distress Eyes: PERLA Vitals: BP 114/69   Pulse 99   Temp (!) 97.3 F (36.3 C)   Resp 18   Ht 5' 3" (1.6 m)   Wt 140 lb (63.5 kg)   SpO2 98%   BMI 24.80 kg/m  BMI: Estimated body mass index is 24.8 kg/m as calculated from the following:   Height as of this encounter: 5' 3" (1.6 m).   Weight as of this encounter: 140 lb (63.5 kg). Ideal: Ideal body weight: 52.4 kg (115 lb 8.3 oz) Adjusted ideal body weight: 56.8 kg (125 lb 5 oz)  Assessment   Status Diagnosis  Controlled Controlled Controlled 1. Chronic lower extremity pain (1ry area of Pain) (Right)   2. CRPS (complex regional pain syndrome) type I of lower limb (Right)   3. Chronic sacroiliac joint pain (Left)   4. Osteoarthritis of sacroiliac joint (HCC) (Left)      Updated Problems: Problem  Chronic sacroiliac joint pain (Left)  Osteoarthritis of sacroiliac joint (HCC) (Left)  Moderate Persistent Asthma   Formatting of this note is different from the original. Last Assessment & Plan:   A sample and prescription have been provided for Dulera 200-5 g, 2 inhalations via spacer device twice a day.  A refill prescription has been provided for montelukast 10 mg daily bedtime.  Restart/continue albuterol HFA, 1-2 inhalations every 4-6 hours as needed.  The patient has been asked to contact me if her symptoms persist or progress. Otherwise, she may return for follow  up in 4 months.   Chronic Abdominal Pain  Chronic Pain Due to Injury  History of Multiple Allergies    Plan of Care  Problem-specific:  No problem-specific Assessment & Plan notes found for this encounter.  Kari Green has a current medication list which includes the following long-term medication(s): atorvastatin, estradiol, fluticasone, hydrochlorothiazide, hydromorphone, ipratropium, potassium chloride, prochlorperazine, promethazine, and hydromorphone.  Pharmacotherapy (Medications Ordered): No orders of the defined types were placed in   this encounter.  Orders:  Orders Placed This Encounter  Procedures  . Radiofrequency, sympathectomy    Standing Status:   Future    Standing Expiration Date:   08/12/2020    Scheduling Instructions:     Laterality: Right-sided     Level(s): Lumbar sympathetic chain (L3, L4)     Sedation: With Sedation     Scheduling Timeframe: As soon as schedule permits    Order Specific Question:   Where will this procedure be performed?    Answer:   [facility]   Follow-up plan:   Return for RFA (40min): (R) L3, L4 LSB RFA.      Interventional Therapies  Risk  Complexity Considerations:   Latex allergy  Steroid allergy  The patient gets her pain medications at the [facility]   Planned  Pending:   Pending further evaluation   Under consideration:   Diagnostic bilateral SI joint injection  Therapeutic bilateral SI joint RFA  Diagnostic bilateral lumbar facet block  Therapeutic bilateral lumbar facet RFA  Diagnostic bilateral IA hip joint injection    Completed:    Palliative right-sided genicular nerve RFA  Palliative right-sided L3 and L4 lumbar sympathetic RFA    Therapeutic  Palliative (PRN) options:   Palliative right genicular nerve RFA  Palliative right L3 and L4 lumbar sympathetic RFA     Recent Visits Date Type Provider Dept  04/08/20 Procedure visit Naveira, Francisco, MD [facility]   03/18/20 Procedure visit Naveira, Francisco, MD [facility]  03/05/20 Office Visit Naveira, Francisco, MD [facility]  Showing recent visits within past 90 days and meeting all other requirements Today's Visits Date Type Provider Dept  05/12/20 Office Visit Naveira, Francisco, MD [facility]  Showing today's visits and meeting all other requirements Future Appointments Date Type Provider Dept  05/27/20 Appointment Naveira, Francisco, MD [facility]  Showing future appointments within next 90 days and meeting all other requirements  I discussed the assessment and treatment plan with the patient. The patient was provided an opportunity to ask questions and all were answered. The patient agreed with the plan and demonstrated an understanding of the instructions.  Patient advised to call back or seek an in-person evaluation if the symptoms or condition worsens.  Duration of encounter: 30 minutes.  Note by: Francisco A Naveira, MD Date: 05/12/2020; Time: 5:12 PM 

## 2020-05-12 ENCOUNTER — Telehealth: Payer: Self-pay | Admitting: Neurology

## 2020-05-12 ENCOUNTER — Encounter: Payer: Self-pay | Admitting: Pain Medicine

## 2020-05-12 ENCOUNTER — Other Ambulatory Visit: Payer: Self-pay

## 2020-05-12 ENCOUNTER — Ambulatory Visit: Payer: BC Managed Care – PPO | Attending: Pain Medicine | Admitting: Pain Medicine

## 2020-05-12 VITALS — BP 114/69 | HR 99 | Temp 97.3°F | Resp 18 | Ht 63.0 in | Wt 140.0 lb

## 2020-05-12 DIAGNOSIS — G90521 Complex regional pain syndrome I of right lower limb: Secondary | ICD-10-CM

## 2020-05-12 DIAGNOSIS — M79604 Pain in right leg: Secondary | ICD-10-CM | POA: Insufficient documentation

## 2020-05-12 DIAGNOSIS — G8921 Chronic pain due to trauma: Secondary | ICD-10-CM | POA: Insufficient documentation

## 2020-05-12 DIAGNOSIS — M533 Sacrococcygeal disorders, not elsewhere classified: Secondary | ICD-10-CM

## 2020-05-12 DIAGNOSIS — Z889 Allergy status to unspecified drugs, medicaments and biological substances status: Secondary | ICD-10-CM | POA: Insufficient documentation

## 2020-05-12 DIAGNOSIS — G8929 Other chronic pain: Secondary | ICD-10-CM

## 2020-05-12 DIAGNOSIS — Z9189 Other specified personal risk factors, not elsewhere classified: Secondary | ICD-10-CM | POA: Insufficient documentation

## 2020-05-12 DIAGNOSIS — M461 Sacroiliitis, not elsewhere classified: Secondary | ICD-10-CM

## 2020-05-12 NOTE — Patient Instructions (Addendum)
____________________________________________________________________________________________  Preparing for Procedure with Sedation  Procedure appointments are limited to planned procedures: . No Prescription Refills. . No disability issues will be discussed. . No medication changes will be discussed.  Instructions: . Oral Intake: Do not eat or drink anything for at least 8 hours prior to your procedure. (Exception: Blood Pressure Medication. See below.) . Transportation: Unless otherwise stated by your physician, you may drive yourself after the procedure. . Blood Pressure Medicine: Do not forget to take your blood pressure medicine with a sip of water the morning of the procedure. If your Diastolic (lower reading)is above 100 mmHg, elective cases will be cancelled/rescheduled. . Blood thinners: These will need to be stopped for procedures. Notify our staff if you are taking any blood thinners. Depending on which one you take, there will be specific instructions on how and when to stop it. . Diabetics on insulin: Notify the staff so that you can be scheduled 1st case in the morning. If your diabetes requires high dose insulin, take only  of your normal insulin dose the morning of the procedure and notify the staff that you have done so. . Preventing infections: Shower with an antibacterial soap the morning of your procedure. . Build-up your immune system: Take 1000 mg of Vitamin C with every meal (3 times a day) the day prior to your procedure. . Antibiotics: Inform the staff if you have a condition or reason that requires you to take antibiotics before dental procedures. . Pregnancy: If you are pregnant, call and cancel the procedure. . Sickness: If you have a cold, fever, or any active infections, call and cancel the procedure. . Arrival: You must be in the facility at least 30 minutes prior to your scheduled procedure. . Children: Do not bring children with you. . Dress appropriately:  Bring dark clothing that you would not mind if they get stained. . Valuables: Do not bring any jewelry or valuables.  Reasons to call and reschedule or cancel your procedure: (Following these recommendations will minimize the risk of a serious complication.) . Surgeries: Avoid having procedures within 2 weeks of any surgery. (Avoid for 2 weeks before or after any surgery). . Flu Shots: Avoid having procedures within 2 weeks of a flu shots or . (Avoid for 2 weeks before or after immunizations). . Barium: Avoid having a procedure within 7-10 days after having had a radiological study involving the use of radiological contrast. (Myelograms, Barium swallow or enema study). . Heart attacks: Avoid any elective procedures or surgeries for the initial 6 months after a "Myocardial Infarction" (Heart Attack). . Blood thinners: It is imperative that you stop these medications before procedures. Let us know if you if you take any blood thinner.  . Infection: Avoid procedures during or within two weeks of an infection (including chest colds or gastrointestinal problems). Symptoms associated with infections include: Localized redness, fever, chills, night sweats or profuse sweating, burning sensation when voiding, cough, congestion, stuffiness, runny nose, sore throat, diarrhea, nausea, vomiting, cold or Flu symptoms, recent or current infections. It is specially important if the infection is over the area that we intend to treat. . Heart and lung problems: Symptoms that may suggest an active cardiopulmonary problem include: cough, chest pain, breathing difficulties or shortness of breath, dizziness, ankle swelling, uncontrolled high or unusually low blood pressure, and/or palpitations. If you are experiencing any of these symptoms, cancel your procedure and contact your primary care physician for an evaluation.  Remember:  Regular Business hours are:    Monday to Thursday 8:00 AM to 4:00 PM  Provider's  Schedule: Viviane Semidey, MD:  Procedure days: Tuesday and Thursday 7:30 AM to 4:00 PM  Bilal Lateef, MD:  Procedure days: Monday and Wednesday 7:30 AM to 4:00 PM ____________________________________________________________________________________________   ____________________________________________________________________________________________  General Risks and Possible Complications  Patient Responsibilities: It is important that you read this as it is part of your informed consent. It is our duty to inform you of the risks and possible complications associated with treatments offered to you. It is your responsibility as a patient to read this and to ask questions about anything that is not clear or that you believe was not covered in this document.  Patient's Rights: You have the right to refuse treatment. You also have the right to change your mind, even after initially having agreed to have the treatment done. However, under this last option, if you wait until the last second to change your mind, you may be charged for the materials used up to that point.  Introduction: Medicine is not an exact science. Everything in Medicine, including the lack of treatment(s), carries the potential for danger, harm, or loss (which is by definition: Risk). In Medicine, a complication is a secondary problem, condition, or disease that can aggravate an already existing one. All treatments carry the risk of possible complications. The fact that a side effects or complications occurs, does not imply that the treatment was conducted incorrectly. It must be clearly understood that these can happen even when everything is done following the highest safety standards.  No treatment: You can choose not to proceed with the proposed treatment alternative. The "PRO(s)" would include: avoiding the risk of complications associated with the therapy. The "CON(s)" would include: not getting any of the treatment  benefits. These benefits fall under one of three categories: diagnostic; therapeutic; and/or palliative. Diagnostic benefits include: getting information which can ultimately lead to improvement of the disease or symptom(s). Therapeutic benefits are those associated with the successful treatment of the disease. Finally, palliative benefits are those related to the decrease of the primary symptoms, without necessarily curing the condition (example: decreasing the pain from a flare-up of a chronic condition, such as incurable terminal cancer).  General Risks and Complications: These are associated to most interventional treatments. They can occur alone, or in combination. They fall under one of the following six (6) categories: no benefit or worsening of symptoms; bleeding; infection; nerve damage; allergic reactions; and/or death. 1. No benefits or worsening of symptoms: In Medicine there are no guarantees, only probabilities. No healthcare provider can ever guarantee that a medical treatment will work, they can only state the probability that it may. Furthermore, there is always the possibility that the condition may worsen, either directly, or indirectly, as a consequence of the treatment. 2. Bleeding: This is more common if the patient is taking a blood thinner, either prescription or over the counter (example: Goody Powders, Fish oil, Aspirin, Garlic, etc.), or if suffering a condition associated with impaired coagulation (example: Hemophilia, cirrhosis of the liver, low platelet counts, etc.). However, even if you do not have one on these, it can still happen. If you have any of these conditions, or take one of these drugs, make sure to notify your treating physician. 3. Infection: This is more common in patients with a compromised immune system, either due to disease (example: diabetes, cancer, human immunodeficiency virus [HIV], etc.), or due to medications or treatments (example: therapies used to treat  cancer and   rheumatological diseases). However, even if you do not have one on these, it can still happen. If you have any of these conditions, or take one of these drugs, make sure to notify your treating physician. 4. Nerve Damage: This is more common when the treatment is an invasive one, but it can also happen with the use of medications, such as those used in the treatment of cancer. The damage can occur to small secondary nerves, or to large primary ones, such as those in the spinal cord and brain. This damage may be temporary or permanent and it may lead to impairments that can range from temporary numbness to permanent paralysis and/or brain death. 5. Allergic Reactions: Any time a substance or material comes in contact with our body, there is the possibility of an allergic reaction. These can range from a mild skin rash (contact dermatitis) to a severe systemic reaction (anaphylactic reaction), which can result in death. 6. Death: In general, any medical intervention can result in death, most of the time due to an unforeseen complication. ____________________________________________________________________________________________  ___________________________________________________________________________________________  Post-Radiofrequency (RF) Discharge Instructions  You have just completed a Radiofrequency Neurotomy.  The following instructions will provide you with information and guidelines for self-care upon discharge.  If at any time you have questions or concerns please call your physician. DO NOT DRIVE YOURSELF!!  Instructions:  Apply ice: Fill a plastic sandwich bag with crushed ice. Cover it with a small towel and apply to injection site. Apply for 15 minutes then remove x 15 minutes. Repeat sequence on day of procedure, until you go to bed. The purpose is to minimize swelling and discomfort after procedure.  Apply heat: Apply heat to procedure site starting the day following the  procedure. The purpose is to treat any soreness and discomfort from the procedure.  Food intake: No eating limitations, unless stipulated above.  Nevertheless, if you have had sedation, you may experience some nausea.  In this case, it may be wise to wait at least two hours prior to resuming regular diet.  Physical activities: Keep activities to a minimum for the first 8 hours after the procedure. For the first 24 hours after the procedure, do not drive a motor vehicle,  Operate heavy machinery, power tools, or handle any weapons.  Consider walking with the use of an assistive device or accompanied by an adult for the first 24 hours.  Do not drink alcoholic beverages including beer.  Do not make any important decisions or sign any legal documents. Go home and rest today.  Resume activities tomorrow, as tolerated.  Use caution in moving about as you may experience mild leg weakness.  Use caution in cooking, use of household electrical appliances and climbing steps.  Driving: If you have received any sedation, you are not allowed to drive for 24 hours after your procedure.  Blood thinner: Restart your blood thinner 6 hours after your procedure. (Only for those taking blood thinners)  Insulin: As soon as you can eat, you may resume your normal dosing schedule. (Only for those taking insulin)  Medications: May resume pre-procedure medications.  Do not take any drugs, other than what has been prescribed to you.  Infection prevention: Keep procedure site clean and dry.  Post-procedure Pain Diary: Extremely important that this be done correctly and accurately. Recorded information will be used to determine the next step in treatment.  Pain evaluated is that of treated area only. Do not include pain from an untreated area.  Complete every  hour, on the hour, for the initial 8 hours. Set an alarm to help you do this part accurately.  Do not go to sleep and have it completed later. It will not be  accurate.  Follow-up appointment: Keep your follow-up appointment after the procedure. Usually 2-6 weeks after radiofrequency. Bring you pain diary. The information collected will be essential for your long-term care.   Expect:  From numbing medicine (AKA: Local Anesthetics): Numbness or decrease in pain.  Onset: Full effect within 15 minutes of injected.  Duration: It will depend on the type of local anesthetic used. On the average, 1 to 8 hours.   From steroids (when added): Decrease in swelling or inflammation. Once inflammation is improved, relief of the pain will follow.  Onset of benefits: Depends on the amount of swelling present. The more swelling, the longer it will take for the benefits to be seen. In some cases, up to 10 days.  Duration: Steroids will stay in the system x 2 weeks. Duration of benefits will depend on multiple posibilities including persistent irritating factors.  From procedure: Some discomfort is to be expected once the numbing medicine wears off. In the case of radiofrequency procedures, this may last as long as 6 weeks. Additional post-procedure pain medication is provided for this. Discomfort is minimized if ice and heat are applied as instructed.  Call if:  You experience numbness and weakness that gets worse with time, as opposed to wearing off.  He experience any unusual bleeding, difficulty breathing, or loss of the ability to control your bowel and bladder. (This applies to Spinal procedures only)  You experience any redness, swelling, heat, red streaks, elevated temperature, fever, or any other signs of a possible infection.  Emergency Numbers:  [place] hours (Monday - Thursday, 8:00 AM - 4:00 PM) (Friday, 9:00 AM - 12:00 Noon): [phone]  After hours: [phone] ____________________________________________________________________________________________    Radiofrequency Lesioning Radiofrequency lesioning is a procedure  that is performed to relieve pain. The procedure is often used for back, neck, or arm pain. Radiofrequency lesioning involves the use of a machine that creates radio waves to make heat. During the procedure, the heat is applied to the nerve that carries the pain signal. The heat damages the nerve and interferes with the pain signal. Pain relief usually starts about 2 weeks after the procedure and lasts for 6 months to 1 year. You will be awake during the procedure. You will need to be able to talk with the health care provider during the procedure. Tell a health care provider about:  Any allergies you have.  All medicines you are taking, including vitamins, herbs, eye drops, creams, and over-the-counter medicines.  Any problems you or family members have had with anesthetic medicines.  Any blood disorders you have.  Any surgeries you have had.  Any medical conditions you have or have had.  Whether you are pregnant or may be pregnant. What are the risks? Generally, this is a safe procedure. However, problems may occur, including:  Pain or soreness at the injection site.  Allergic reaction to medicines given during the procedure.  Bleeding.  Infection at the injection site.  Damage to nerves or blood vessels. What happens before the procedure? Staying hydrated Follow instructions from your health care provider about hydration, which may include:  Up to 2 hours before the procedure - you may continue to drink clear liquids, such as water, clear fruit juice, black coffee, and plain tea. Eating and drinking Follow  instructions from your health care provider about eating and drinking, which may include:  8 hours before the procedure - stop eating heavy meals or foods, such as meat, fried foods, or fatty foods.  6 hours before the procedure - stop eating light meals or foods, such as toast or cereal.  6 hours before the procedure - stop drinking milk or drinks that contain  milk.  2 hours before the procedure - stop drinking clear liquids. Medicines Ask your health care provider about:  Changing or stopping your regular medicines. This is especially important if you are taking diabetes medicines or blood thinners.  Taking medicines such as aspirin and ibuprofen. These medicines can thin your blood. Do not take these medicines unless your health care provider tells you to take them.  Taking over-the-counter medicines, vitamins, herbs, and supplements. General instructions  Plan to have someone take you home from the hospital or clinic.  If you will be going home right after the procedure, plan to have someone with you for 24 hours.  Ask your health care provider what steps will be taken to help prevent infection. These may include: ? Removing hair at the procedure site. ? Washing skin with a germ-killing soap. ? Taking antibiotic medicine. What happens during the procedure?  An IV will be inserted into one of your veins.  You will be given one or more of the following: ? A medicine to help you relax (sedative). ? A medicine to numb the area (local anesthetic).  Your health care provider will insert a radiofrequency needle into the area to be treated. This is done with the help of a type of X-ray (fluoroscopy).  A wire that carries the radio waves (electrode) will be put through the radiofrequency needle.  An electrical pulse will be sent through the electrode to verify the correct nerve that is causing your pain. You will feel a tingling sensation, and you may have muscle twitching.  The tissue around the needle tip will be heated by an electric current that comes from the radiofrequency machine. This will numb the nerves.  The needle will be removed.  A bandage (dressing) will be put on the insertion area. The procedure may vary among health care providers and hospitals.   What happens after the procedure?  Your blood pressure, heart rate,  breathing rate, and blood oxygen level will be monitored until you leave the hospital or clinic.  Return to your normal activities as told by your health care provider. Ask your health care provider what activities are safe for you.  Do not drive for 24 hours if you were given a sedative during your procedure. Summary  Radiofrequency lesioning is a procedure that is performed to relieve pain. The procedure is often used for back, neck, or arm pain.  Radiofrequency lesioning involves the use of a machine that creates radio waves to make heat.  Plan to have someone take you home from the hospital or clinic. Do not drive for 24 hours if you were given a sedative during your procedure.  Return to your normal activities as told by your health care provider. Ask your health care provider what activities are safe for you. This information is not intended to replace advice given to you by your health care provider. Make sure you discuss any questions you have with your health care provider. Document Revised: 10/27/2017 Document Reviewed: 10/27/2017 Else[employer] Patient Education  [place].

## 2020-05-12 NOTE — Telephone Encounter (Signed)
Appointment for Botox switched to Dr. Jaynee Eagles per Romelle Starcher, RN.

## 2020-05-12 NOTE — Progress Notes (Signed)
Safety precautions to be maintained throughout the outpatient stay will include: orient to surroundings, keep bed in low position, maintain call bell within reach at all times, provide assistance with transfer out of bed and ambulation.  

## 2020-05-27 ENCOUNTER — Ambulatory Visit: Payer: BC Managed Care – PPO | Admitting: Pain Medicine

## 2020-06-03 ENCOUNTER — Ambulatory Visit (HOSPITAL_BASED_OUTPATIENT_CLINIC_OR_DEPARTMENT_OTHER): Payer: BC Managed Care – PPO | Admitting: Pain Medicine

## 2020-06-03 ENCOUNTER — Encounter: Payer: Self-pay | Admitting: Pain Medicine

## 2020-06-03 ENCOUNTER — Other Ambulatory Visit: Payer: Self-pay

## 2020-06-03 ENCOUNTER — Ambulatory Visit
Admission: RE | Admit: 2020-06-03 | Discharge: 2020-06-03 | Disposition: A | Discharge status: Home / Self Care | Payer: BC Managed Care – PPO | Source: Ambulatory Visit | Attending: Pain Medicine | Admitting: Pain Medicine

## 2020-06-03 VITALS — BP 120/78 | Temp 97.4°F | Resp 13 | Ht 63.0 in | Wt 135.0 lb

## 2020-06-03 DIAGNOSIS — Z9889 Other specified postprocedural states: Secondary | ICD-10-CM | POA: Insufficient documentation

## 2020-06-03 DIAGNOSIS — R110 Nausea: Secondary | ICD-10-CM | POA: Insufficient documentation

## 2020-06-03 DIAGNOSIS — G8921 Chronic pain due to trauma: Secondary | ICD-10-CM | POA: Diagnosis not present

## 2020-06-03 DIAGNOSIS — Z87898 Personal history of other specified conditions: Secondary | ICD-10-CM

## 2020-06-03 DIAGNOSIS — M25561 Pain in right knee: Secondary | ICD-10-CM

## 2020-06-03 DIAGNOSIS — Z882 Allergy status to sulfonamides status: Secondary | ICD-10-CM | POA: Diagnosis not present

## 2020-06-03 DIAGNOSIS — G90521 Complex regional pain syndrome I of right lower limb: Secondary | ICD-10-CM

## 2020-06-03 DIAGNOSIS — G8929 Other chronic pain: Secondary | ICD-10-CM | POA: Insufficient documentation

## 2020-06-03 DIAGNOSIS — Z91041 Radiographic dye allergy status: Secondary | ICD-10-CM

## 2020-06-03 DIAGNOSIS — Z889 Allergy status to unspecified drugs, medicaments and biological substances status: Secondary | ICD-10-CM

## 2020-06-03 DIAGNOSIS — Z91040 Latex allergy status: Secondary | ICD-10-CM

## 2020-06-03 DIAGNOSIS — M792 Neuralgia and neuritis, unspecified: Secondary | ICD-10-CM | POA: Diagnosis not present

## 2020-06-03 DIAGNOSIS — M79604 Pain in right leg: Secondary | ICD-10-CM

## 2020-06-03 DIAGNOSIS — Z883 Allergy status to other anti-infective agents status: Secondary | ICD-10-CM | POA: Diagnosis not present

## 2020-06-03 DIAGNOSIS — Z96651 Presence of right artificial knee joint: Secondary | ICD-10-CM

## 2020-06-03 DIAGNOSIS — R208 Other disturbances of skin sensation: Secondary | ICD-10-CM | POA: Insufficient documentation

## 2020-06-03 DIAGNOSIS — Z888 Allergy status to other drugs, medicaments and biological substances status: Secondary | ICD-10-CM | POA: Insufficient documentation

## 2020-06-03 DIAGNOSIS — Z881 Allergy status to other antibiotic agents status: Secondary | ICD-10-CM | POA: Insufficient documentation

## 2020-06-03 DIAGNOSIS — Z95828 Presence of other vascular implants and grafts: Secondary | ICD-10-CM

## 2020-06-03 DIAGNOSIS — Z885 Allergy status to narcotic agent status: Secondary | ICD-10-CM | POA: Insufficient documentation

## 2020-06-03 MED ORDER — LIDOCAINE HCL 2 % IJ SOLN
20.0000 mL | Freq: Once | INTRAMUSCULAR | Status: AC
  Administered 2020-06-03: 400 mg
  Filled 2020-06-03: qty 40

## 2020-06-03 MED ORDER — ONDANSETRON HCL 4 MG/2ML IJ SOLN
4.0000 mg | Freq: Once | INTRAMUSCULAR | Status: AC
  Administered 2020-06-03: 4 mg via INTRAVENOUS

## 2020-06-03 MED ORDER — DIPHENHYDRAMINE HCL 50 MG/ML IJ SOLN
INTRAMUSCULAR | Status: AC
  Filled 2020-06-03: qty 1

## 2020-06-03 MED ORDER — BUPIVACAINE-EPINEPHRINE (PF) 0.25% -1:200000 IJ SOLN
10.0000 mL | Freq: Once | INTRAMUSCULAR | Status: AC
  Administered 2020-06-03: 10 mL

## 2020-06-03 MED ORDER — MIDAZOLAM HCL 5 MG/5ML IJ SOLN
1.0000 mg | INTRAMUSCULAR | Status: DC | PRN
  Administered 2020-06-03: 2 mg via INTRAVENOUS
  Filled 2020-06-03: qty 5

## 2020-06-03 MED ORDER — DIPHENHYDRAMINE HCL 50 MG/ML IJ SOLN
12.5000 mg | Freq: Once | INTRAMUSCULAR | Status: AC
  Administered 2020-06-03: 12.5 mg via INTRAVENOUS

## 2020-06-03 MED ORDER — LACTATED RINGERS IV SOLN
1000.0000 mL | Freq: Once | INTRAVENOUS | Status: AC
  Administered 2020-06-03: 1000 mL via INTRAVENOUS

## 2020-06-03 MED ORDER — FENTANYL CITRATE (PF) 100 MCG/2ML IJ SOLN
25.0000 ug | INTRAMUSCULAR | Status: DC | PRN
  Administered 2020-06-03: 50 ug via INTRAVENOUS
  Filled 2020-06-03: qty 2

## 2020-06-03 MED ORDER — BUPIVACAINE-EPINEPHRINE (PF) 0.25% -1:200000 IJ SOLN
INTRAMUSCULAR | Status: AC
  Filled 2020-06-03: qty 30

## 2020-06-03 MED ORDER — CEFAZOLIN SODIUM 1 G IJ SOLR
INTRAMUSCULAR | Status: AC
  Filled 2020-06-03: qty 10

## 2020-06-03 MED ORDER — CEFAZOLIN SODIUM-DEXTROSE 1-4 GM/50ML-% IV SOLN
1.0000 g | Freq: Once | INTRAVENOUS | Status: AC
  Administered 2020-06-03: 1 g via INTRAVENOUS

## 2020-06-03 MED ORDER — ONDANSETRON HCL 4 MG/2ML IJ SOLN
INTRAMUSCULAR | Status: AC
  Filled 2020-06-03: qty 2

## 2020-06-03 NOTE — Progress Notes (Signed)
PROVIDER NOTE: Information contained herein reflects review and annotations entered in association with encounter. Interpretation of such information and data should be left to medically-trained personnel. Information provided to patient can be located elsewhere in the medical record under "Patient Instructions". Document created using STT-dictation technology, any transcriptional errors that may result from process are unintentional.    Patient: Kari Green  Service Category: Procedure  Provider: Gaspar Cola, MD  DOB: December 30, 1965  DOS: 06/03/2020  Location: [facility]  MRN: [id number]  Setting: Ambulatory - outpatient  Referring Provider: Jani Gravel, MD  Type: Established Patient  Specialty: Interventional Pain Management  PCP: Jani Gravel, MD   Primary Reason for Visit: Interventional Pain Management Treatment. CC: Leg Pain  Procedure:          Anesthesia, Analgesia, Anxiolysis:  Type: Therapeutic Lumbar Sympathetic Radiofrequency Ablation  #4 (NO STEROIDS) Region:Thoracolumbar Level: L3, L4 Laterality: Right Paravertebral  Type: Moderate (Conscious) Sedation combined with Local Anesthesia Indication(s): Analgesia and Anxiety Route: Intravenous (IV) IV Access: Secured Sedation: Meaningful verbal contact was maintained at all times during the procedure  Local Anesthetic: Lidocaine 1-2%  Position: Prone with head of the table was raised to facilitate breathing.   Indications: 1. CRPS (complex regional pain syndrome) type I of lower limb (Right)   2. Chronic lower extremity pain (1ry area of Pain) (Right)   3. 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 Chronic pain due to injury   5. Sympathetic pain   6. Burning sensation of skin   7. History of allergy to IVP dye   8. History of allergy to latex   9. Multiple drug allergies (codeine, Lidoderm a DC if, sulfa, latex, IVP dye, Keflex, Augmentin)   10. Port-A-Cath in place    Ms.  Green has been dealing with the above chronic pain for longer than three months and has either failed to respond, was unable to tolerate, or simply did not get enough benefit from other more conservative therapies including, but not limited to: 1. Over-the-counter medications 2. Anti-inflammatory medications 3. Muscle relaxants 4. Membrane stabilizers 5. Opioids 6. Physical therapy and/or chiropractic manipulation 7. Modalities (Heat, ice, etc.) 8. Invasive techniques such as nerve blocks. Kari Green has attained more than 50% relief of the pain from a series of diagnostic injections conducted in separate occasions.  Pain Score: Pre-procedure: 8 /10 Post-procedure: 0-No pain/10  Pre-op H&P Assessment:  Kari Green is a 55 y.o. (year old), female patient, seen today for interventional treatment. She  has a past surgical history that includes Total thyroidectomy (1993); cysto with hydrodistension (01/29/2011); Cystoscopy w/ retrogrades (01/29/2011); Fissurectomy (~ 1988 x2); Refractive surgery (Bilateral, ~ 1999; ~ 2013); Nasal sinus surgery (2013); Laparoscopic lysis of adhesions (02/11/2012); Excision haglund's deformity with achilles tendon repair (Bilateral, 2013-2014); Melanoma excision (Left, 2013); Cystoscopy with stent placement (2006); Appendectomy (~ 1992); Tonsillectomy and adenoidectomy (~ 1989); LAPAROSCOPY ADHESIOLYSIS AND REMOVAL POSSIBLE RIGHT OVARY REMNANT (04/18/2000); Knee arthroscopy (Right, 08/16/2000); Carpal tunnel release (Bilateral, right 07/26/2001;  left ?); DEBRIDEMENT RIGHT THUMB MP JOINT (Right, 04/10/2002); RIGHT THUMB FUSION OF MPJ (Right, 10/03/2002); DECOMPRESSION LEFT ULNAR NERVE, ELBOW (Left, 01/29/2003); RIGHT TOTAL KNEE REVISION ARTHROPLASTY (Right, 03/14/2007); CYSTO/ URETHRAL DILATION/ HYDRODISTENTION/ BLADDER BX (04/10/2003); Laparoscopic assisted vaginal hysterectomy (Feb 1999); LAPAROSCOPY LYSIS ADHESIONS/  BILATERAL SALPINGOOPHORECTOMY/  FULGERATION OF  ENDOMETRIOSIS (1998); Anterior cervical decomp/discectomy fusion (03/10/2009); Esophagogastroduodenoscopy (egd) with esophageal dilation (10-26-2000); CYSTO/  BLADDER BX (03/08/2008); transthoracic echocardiogram (09/22/2012); cysto with hydrodistension (N/A, 05/08/2014);  laparoscopy (02/11/2012); Transanal hemorrhoidal dearterialization (03/26/2011); Knee arthroscopy with fulkerson slide (Right, 11/29/2000); Laparoscopic cholecystectomy (03/04/2002); Total knee arthroplasty (Right, 09/10/2003; 12/08/2012); Carpometacarpel [facility]) suspension plasty (Right, 02/10/2015); Ganglion cyst excision (Right, 02/10/2015); NEGATIVE SLEEP STUDY (2006 approx .  per pt); cysto with hydrodistension (N/A, 03/27/2015); Kidney stone surgery; Anterior cervical decomp/discectomy fusion (N/A, 07/16/2015); Video bronchoscopy (Bilateral, 10/20/2015); Shoulder hemi-arthroplasty (Left, 12/04/2015); EUS (N/A, 04/23/2016); Trigger finger release (Left, 07/06/2016); IR FLUORO GUIDE PORT INSERTION RIGHT (11/04/2016); IR US Guide Vasc Access Right (11/04/2016); Shoulder arthroscopy (Left); Augmentation mammaplasty (Bilateral, 1996; 2001, 2016); Breast surgery (age 59); Cystoscopy with hydrodistension and biopsy (N/A, 06/29/2017); sympathetic nerve ablation (07/05/2017); and Lymph node biopsy (Left, 07/24/2018). Kari Green has a current medication list which includes the following prescription(s): alprazolam, atorvastatin, botox, cyanocobalamin, diclofenac sodium, diphenhydramine, estradiol, evzio, fluticasone, gabapentin, hydrochlorothiazide, hydromorphone, hydroxyzine, ipratropium, ketoconazole, ketorolac, dayvigo, levothyroxine, misc natural products, multivitamin with minerals, NON FORMULARY, onabotulinumtoxina, potassium chloride, prochlorperazine, promethazine, tizanidine, topiramate, vitamin c, and hydromorphone, and the following Facility-Administered Medications: fentanyl and midazolam. Her primarily concern today is the Leg Pain  Initial Vital Signs:   Pulse/HCG Rate:  ECG Heart Rate: 90 Temp: (!) 97.2 F (36.2 C) Resp: (!) 57 BP: (!) 154/97 SpO2: 100 %  BMI: Estimated body mass index is 23.91 kg/m as calculated from the following:   Height as of this encounter: 5\' 3"  (1.6 m).   Weight as of this encounter: 135 lb (61.2 kg).  Risk Assessment: Allergies: Reviewed. She is allergic to cobalt, contrast media [iodinated diagnostic agents], latex, nickel, peanuts [peanut oil], red dye, azithromycin, chloraprep one step [chlorhexidine gluconate], corticosteroids, flexeril [cyclobenzaprine], morphine and related, other, percocet [oxycodone-acetaminophen], supartz [sodium hyaluronate (avian)], surgical lubricant, tape, trazodone, yellow dye, yellow dyes (non-tartrazine), cephalexin, monistat [miconazole], prednisone, sulfa antibiotics, tea, and soap.  Allergy Precautions: None required Coagulopathies: Reviewed. None identified.  Blood-thinner therapy: None at this time Active Infection(s): Reviewed. None identified. Ms. Hebdon is afebrile  Site Confirmation: Ms. Cocco was asked to confirm the procedure and laterality before marking the site Procedure checklist: Completed Consent: Before the procedure and under the influence of no sedative(s), amnesic(s), or anxiolytics, the patient was informed of the treatment options, risks and possible complications. To fulfill our ethical and legal obligations, as recommended by the American Medical Association's Code of Ethics, I have informed the patient of my clinical impression; the nature and purpose of the treatment or procedure; the risks, benefits, and possible complications of the intervention; the alternatives, including doing nothing; the risk(s) and benefit(s) of the alternative treatment(s) or procedure(s); and the risk(s) and benefit(s) of doing nothing. The patient was provided information about the general risks and possible complications associated with the procedure. These may include,  but are not limited to: failure to achieve desired goals, infection, bleeding, organ or nerve damage, allergic reactions, paralysis, and death. In addition, the patient was informed of those risks and complications associated to Spine-related procedures, such as failure to decrease pain; infection (i.e.: Meningitis, epidural or intraspinal abscess); bleeding (i.e.: epidural hematoma, subarachnoid hemorrhage, or any other type of intraspinal or peri-dural bleeding); organ or nerve damage (i.e.: Any type of peripheral nerve, nerve root, or spinal cord injury) with subsequent damage to sensory, motor, and/or autonomic systems, resulting in permanent pain, numbness, and/or weakness of one or several areas of the body; allergic reactions; (i.e.: anaphylactic reaction); and/or death. Furthermore, the patient was informed of those risks and complications associated with the medications. These include, but are not limited to: allergic reactions (i.e.: anaphylactic or  anaphylactoid reaction(s)); adrenal axis suppression; blood sugar elevation that in diabetics may result in ketoacidosis or comma; water retention that in patients with history of congestive heart failure may result in shortness of breath, pulmonary edema, and decompensation with resultant heart failure; weight gain; swelling or edema; medication-induced neural toxicity; particulate matter embolism and blood vessel occlusion with resultant organ, and/or nervous system infarction; and/or aseptic necrosis of one or more joints. Finally, the patient was informed that Medicine is not an exact science; therefore, there is also the possibility of unforeseen or unpredictable risks and/or possible complications that may result in a catastrophic outcome. The patient indicated having understood very clearly. We have given the patient no guarantees and we have made no promises. Enough time was given to the patient to ask questions, all of which were answered to the  patient's satisfaction. Ms. Abdulla has indicated that she wanted to continue with the procedure. Attestation: I, the ordering provider, attest that I have discussed with the patient the benefits, risks, side-effects, alternatives, likelihood of achieving goals, and potential problems during recovery for the procedure that I have provided informed consent. Date  Time: 06/03/2020 10:10 AM  Pre-Procedure Preparation:  Monitoring: As per clinic protocol. Respiration, ETCO2, SpO2, BP, heart rate and rhythm monitor placed and checked for adequate function Safety Precautions: Patient was assessed for positional comfort and pressure points before starting the procedure. Time-out: I initiated and conducted the "Time-out" before starting the procedure, as per protocol. The patient was asked to participate by confirming the accuracy of the "Time Out" information. Verification of the correct person, site, and procedure were performed and confirmed by me, the nursing staff, and the patient. "Time-out" conducted as per Joint Commission's Universal Protocol (UP.01.01.01). Time: 1113  Description of Procedure:          Target Area: For Lumbar Sympathetic Block(s), the target is the anterolateral aspect of the L3 & L4 vertebral bodies, where the lumbar sympathetic chain resides. Approach: Paravertebral, ipsilateral approach. Area Prepped: Entire Posterior Thoracolumbar Region Isopropyl Alcohol (70%) Safety Precautions: Aspiration looking for blood return was conducted prior to all injections. At no point did we inject any substances, as a needle was being advanced. No attempts were made at seeking any paresthesias. Safe injection practices and needle disposal techniques used. Medications properly checked for expiration dates. SDV (single dose vial) medications used. Description of the Procedure: Protocol guidelines were followed. The patient was placed in position over the procedure table. The target area was  identified and the area prepped in the usual manner. The skin and muscle were infiltrated with local anesthetic. Appropriate amount of time allowed to pass for local anesthetics to take effect. Radiofrequency needles were introduced to the target area using fluoroscopic guidance. Using the NeuroTherm NT1100 Radiofrequency Generator, sensory stimulation using 50 Hz was used to locate & identify the nerve, making sure that the needle was positioned such that there was no sensory stimulation below 0.3 V or above 0.7 V. Stimulation using 2 Hz was used to evaluate the motor component. Care was taken not to lesion any nerves that demonstrated motor stimulation of the lower extremities at an output of less than 2.5 times that of the sensory threshold, or a maximum of 2.0 V. Once satisfactory placement of the needles was achieved, the numbing solution was slowly injected after negative aspiration. After waiting for at least 2 minutes, the ablation was performed at 80 degrees C for 60 seconds, using regular Radiofrequency settings. Once the procedure was completed, the  needles were then removed and the area cleansed, making sure to leave some of the prepping solution back to take advantage of its long term bactericidal properties. Intra-operative Compliance: Compliant Vitals:   06/03/20 1137 06/03/20 1147 06/03/20 1157 06/03/20 1207  BP: 114/65 112/68 120/76 120/78  Resp: 14 10 13    Temp:  (!) 97.2 F (36.2 C)  (!) 97.4 F (36.3 C)  SpO2: 95% 98% 100% 100%  Weight:      Height:        Start Time: 1113 hrs. End Time: 1137 hrs. Materials & Medications:  Needle(s) Type: Teflon-coated, curved tip, Radiofrequency needle(s) Gauge: 20G Length: 15cm Medication(s): Please see orders for medications and dosing details.  Imaging Guidance (Spinal):          Type of Imaging Technique: Fluoroscopy Guidance (Spinal) Indication(s): Assistance in needle guidance and placement for procedures requiring needle placement  in or near specific anatomical locations not easily accessible without such assistance. Exposure Time: Please see nurses notes. Contrast: Before injecting any contrast, we confirmed that the patient did not have an allergy to iodine, shellfish, or radiological contrast. Once satisfactory needle placement was completed at the desired level, radiological contrast was injected. Contrast injected under live fluoroscopy. No contrast complications. See chart for type and volume of contrast used. Fluoroscopic Guidance: I was personally present during the use of fluoroscopy. "Tunnel Vision Technique" used to obtain the best possible view of the target area. Parallax error corrected before commencing the procedure. "Direction-depth-direction" technique used to introduce the needle under continuous pulsed fluoroscopy. Once target was reached, antero-posterior, oblique, and lateral fluoroscopic projection used confirm needle placement in all planes. Images permanently stored in EMR. Interpretation: I personally interpreted the imaging intraoperatively. Adequate needle placement confirmed in multiple planes. Appropriate spread of contrast into desired area was observed. No evidence of afferent or efferent intravascular uptake. No intrathecal or subarachnoid spread observed. Permanent images saved into the patient's record.  Antibiotic Prophylaxis:   Anti-infectives (From admission, onward)   Start     Dose/Rate Route Frequency Ordered Stop   06/03/20 1030  ceFAZolin (ANCEF) IVPB 1 g/50 mL premix        1 g 100 mL/hr over 30 Minutes Intravenous  Once 06/03/20 1015 06/03/20 1130     Indication(s): None identified  Post-operative Assessment:  Post-procedure Vital Signs:  Pulse/HCG Rate:  65 Temp: (!) 97.4 F (36.3 C) Resp: 13 BP: 120/78 SpO2: 100 %  EBL: None  Complications: No immediate post-treatment complications observed by team, or reported by patient.  Note: The patient tolerated the entire  procedure well. A repeat set of vitals were taken after the procedure and the patient was kept under observation following institutional policy, for this type of procedure. Post-procedural neurological assessment was performed, showing return to baseline, prior to discharge. The patient was provided with post-procedure discharge instructions, including a section on how to identify potential problems. Should any problems arise concerning this procedure, the patient was given instructions to immediately contact us, at any time, without hesitation. In any case, we plan to contact the patient by telephone for a follow-up status report regarding this interventional procedure.  Comments:  No additional relevant information.  Plan of Care  Orders:  Orders Placed This Encounter  Procedures  . Radiofrequency, sympathectomy    Scheduling Instructions:     Laterality: Right-side     Level(s): Lumbar sympathetic chain (L3, L4)     Sedation: With Sedation     Timeframe: Today  Order Specific Question:   Where will this procedure be performed?    Answer:   [facility]  . [facility] C-ARM 1-60 MIN NO REPORT    Intraoperative interpretation by procedural physician at [facility].    Standing Status:   Standing    Number of Occurrences:   1    Order Specific Question:   Reason for exam:    Answer:   Assistance in needle guidance and placement for procedures requiring needle placement in or near specific anatomical locations not easily accessible without such assistance.  . Informed Consent Details: Physician/Practitioner Attestation; Transcribe to consent form and obtain patient signature    Scheduling Instructions:     Note: Always confirm laterality of pain with Ms. Lotter, before procedure.    Order Specific Question:   Physician/Practitioner attestation of informed consent for procedure/surgical case    Answer:   I, the physician/practitioner, attest that I have discussed  with the patient the benefits, risks, side effects, alternatives, likelihood of achieving goals and potential problems during recovery for the procedure that I have provided informed consent.    Order Specific Question:   Procedure    Answer:   Lumbar sympathectomy via radiofrequency ablation    Order Specific Question:   Physician/Practitioner performing the procedure    Answer:   Kimbrely Buckel A. Dossie Arbour, MD    Order Specific Question:   Indication/Reason    Answer:   Lower extremity complex regional pain syndrome  . Provide equipment / supplies at bedside    "Radiofrequency Tray"; Large hemostat (x1); Small hemostat (x1); Towels (x8); 4x4 sterile sponge pack (x1) Needle type: Teflon-coated Radiofrequency Needle (Disposable  single use) Size: Long Quantity: 2    Standing Status:   Standing    Number of Occurrences:   1    Order Specific Question:   Specify    Answer:   Radiofrequency Tray  . Miscellanous precautions    Standing Status:   Standing    Number of Occurrences:   1  . Miscellanous precautions    NOTE: Although It is true that patients can have allergies to shellfish and that shellfish contain iodine, most shellfish  allergies are due to two protein allergens present in the shellfish: tropomyosins and parvalbumin. Not all patients with shellfish allergies are allergic to iodine. However, as a precaution, avoid using iodine containing products.    Standing Status:   Standing    Number of Occurrences:   1  . Latex precautions    Activate Latex-Free Protocol.    Standing Status:   Standing    Number of Occurrences:   1   Chronic Opioid Analgesic:  No opioid analgesics prescribed by our practice.  The last time that we checked, the patient was getting her pain medications from the [facility].   Medications ordered for procedure: Meds ordered this encounter  Medications  . lidocaine (XYLOCAINE) 2 % (with pres) injection 400 mg  . lactated ringers infusion 1,000 mL  .  midazolam (VERSED) 5 MG/5ML injection 1-2 mg    Make sure Flumazenil is available in the pyxis when using this medication. If oversedation occurs, administer 0.2 mg IV over 15 sec. If after 45 sec no response, administer 0.2 mg again over 1 min; may repeat at 1 min intervals; not to exceed 4 doses (1 mg)  . fentaNYL (SUBLIMAZE) injection 25-50 mcg    Make sure Narcan is available in the pyxis when using this medication. In the  event of respiratory depression (RR< 8/min): Titrate NARCAN (naloxone) in increments of 0.1 to 0.2 mg IV at 2-3 minute intervals, until desired degree of reversal.  . ceFAZolin (ANCEF) IVPB 1 g/50 mL premix    Order Specific Question:   Antibiotic Indication:    Answer:   Surgical Prophylaxis    Order Specific Question:   Other Indication:    Answer:   Surgical Prophylaxis  . diphenhydrAMINE (BENADRYL) injection 12.5 mg  . ondansetron (ZOFRAN) injection 4 mg  . bupivacaine-epinephrine (MARCAINE W/ EPI) 0.25% -1:200000 injection 10 mL   Medications administered: We administered lidocaine, lactated ringers, midazolam, fentaNYL, ceFAZolin, diphenhydrAMINE, ondansetron, and bupivacaine-epinephrine.  See the medical record for exact dosing, route, and time of administration.  Follow-up plan:   Return in about 6 weeks (around 07/15/2020) for on afternoon of procedure day, (VV), (PPE).       Interventional Therapies  Risk  Complexity Considerations:   Latex allergy  Steroid allergy  The patient gets her pain medications at the [facility]   Planned  Pending:   Pending further evaluation   Under consideration:   Diagnostic bilateral SI joint injection  Therapeutic bilateral SI joint RFA  Diagnostic bilateral lumbar facet block  Therapeutic bilateral lumbar facet RFA  Diagnostic bilateral IA hip joint injection    Completed:   Palliative right genicular nerves RFA x4 (03/18/2020)  Diagnostic right L3, L4 lumbar sympathetic Block x1 (04/08/2020)  Palliative  right L3, L4 lumbar sympathetic RFA x3 (07/05/2017)    Therapeutic  Palliative (PRN) options:   Palliative right genicular nerve RFA #5  Palliative right L3 & L4 lumbar sympathetic RFA #4     Recent Visits Date Type Provider Dept  05/12/20 Office Visit Milinda Pointer, [facility]  04/08/20 Procedure visit Milinda Pointer, MD [facility]  03/18/20 Procedure visit Milinda Pointer, [facility]  03/05/20 Office Visit Milinda Pointer, MD [facility]  Showing recent visits within past 90 days and meeting all other requirements Today's Visits Date Type Provider Dept  06/03/20 Procedure visit Milinda Pointer, MD [facility]  Showing today's visits and meeting all other requirements Future Appointments Date Type Provider Dept  07/22/20 Appointment Milinda Pointer, MD [facility]  Showing future appointments within next 90 days and meeting all other requirements  Disposition: Discharge home  Discharge (Date  Time): 06/03/2020; 1210 hrs.   Primary Care Physician: Jani Gravel, MD Location: [facility] Note by: Gaspar Cola, MD Date: 06/03/2020; Time: 12:18 PM  Disclaimer:  Medicine is not an Chief Strategy Officer. The only guarantee in medicine is that nothing is guaranteed. It is important to note that the decision to proceed with this intervention was based on the information collected from the patient. The Data and conclusions were drawn from the patient's questionnaire, the interview, and the physical examination. Because the information was provided in large part by the patient, it cannot be guaranteed that it has not been purposely or unconsciously manipulated. Every effort has been made to obtain as much relevant data as possible for this evaluation. It is important to note that the conclusions that lead to this procedure are derived in large part from the available data. Always  take into account that the treatment will also be dependent on availability of resources and existing treatment guidelines, considered by other Pain Management Practitioners as being common knowledge and practice, at the time of the intervention. For Medico-Legal purposes, it is also important  to point out that variation in procedural techniques and pharmacological choices are the acceptable norm. The indications, contraindications, technique, and results of the above procedure should only be interpreted and judged by a Board-Certified Interventional Pain Specialist with extensive familiarity and expertise in the same exact procedure and technique.

## 2020-06-03 NOTE — Progress Notes (Signed)
Safety precautions to be maintained throughout the outpatient stay will include: orient to surroundings, keep bed in low position, maintain call bell within reach at all times, provide assistance with transfer out of bed and ambulation.  

## 2020-06-03 NOTE — Patient Instructions (Signed)

## 2020-06-04 ENCOUNTER — Telehealth: Payer: Self-pay

## 2020-06-04 NOTE — Telephone Encounter (Signed)
Post procedure phone call .  Patient states she is doing good.  States her port is slightly swollen.  Informed her to call who ever manages her porta cath and let them know. Instructed her to call for any further questions or concerns.

## 2020-06-20 ENCOUNTER — Other Ambulatory Visit: Payer: Self-pay

## 2020-06-20 ENCOUNTER — Ambulatory Visit (HOSPITAL_COMMUNITY)
Admission: RE | Admit: 2020-06-20 | Discharge: 2020-06-20 | Disposition: A | Discharge status: Home / Self Care | Payer: BC Managed Care – PPO | Source: Ambulatory Visit | Attending: Internal Medicine | Admitting: Internal Medicine

## 2020-06-20 DIAGNOSIS — Z452 Encounter for adjustment and management of vascular access device: Secondary | ICD-10-CM | POA: Diagnosis not present

## 2020-06-20 MED ORDER — HEPARIN SOD (PORK) LOCK FLUSH 100 UNIT/ML IV SOLN
INTRAVENOUS | Status: AC
  Administered 2020-06-20: 500 [IU]
  Filled 2020-06-20: qty 5

## 2020-06-24 DIAGNOSIS — Z20822 Contact with and (suspected) exposure to covid-19: Secondary | ICD-10-CM | POA: Diagnosis not present

## 2020-06-24 DIAGNOSIS — J029 Acute pharyngitis, unspecified: Secondary | ICD-10-CM | POA: Diagnosis not present

## 2020-06-24 DIAGNOSIS — R059 Cough, unspecified: Secondary | ICD-10-CM | POA: Diagnosis not present

## 2020-07-08 DIAGNOSIS — M7911 Myalgia of mastication muscle: Secondary | ICD-10-CM | POA: Diagnosis not present

## 2020-07-08 DIAGNOSIS — M26623 Arthralgia of bilateral temporomandibular joint: Secondary | ICD-10-CM | POA: Diagnosis not present

## 2020-07-08 DIAGNOSIS — M2652 Limited mandibular range of motion: Secondary | ICD-10-CM | POA: Diagnosis not present

## 2020-07-08 DIAGNOSIS — F458 Other somatoform disorders: Secondary | ICD-10-CM | POA: Diagnosis not present

## 2020-07-14 ENCOUNTER — Other Ambulatory Visit: Payer: Self-pay | Admitting: Family Medicine

## 2020-07-14 MED ORDER — KETOROLAC TROMETHAMINE 60 MG/2ML IM SOLN: INTRAMUSCULAR | 0 refills | Status: DC

## 2020-07-17 ENCOUNTER — Inpatient Hospital Stay (HOSPITAL_COMMUNITY): Admission: RE | Admit: 2020-07-17 | Payer: BC Managed Care – PPO | Source: Ambulatory Visit

## 2020-07-21 NOTE — Progress Notes (Signed)
Patient: Kari Green  Service Category: E/M  Provider: Gaspar Cola, MD  DOB: 1965/09/22  DOS: 07/22/2020  Location: Office  MRN: [id number]  Setting: Ambulatory outpatient  Referring Provider: Jani Gravel, MD  Type: Established Patient  Specialty: Interventional Pain Management  PCP: Kari Gravel, MD  Location: Remote location  Delivery: TeleHealth     Virtual Encounter - Pain Management PROVIDER NOTE: Information contained herein reflects review and annotations entered in association with encounter. Interpretation of such information and data should be left to medically-trained personnel. Information provided to patient can be located elsewhere in the medical record under "Patient Instructions". Document created using STT-dictation technology, any transcriptional errors that may result from process are unintentional.    Contact & Pharmacy Preferred: [phone] Home: [phone] (home) Mobile: [phone] (mobile) E-mail: [email or web address]  [id number] [place] [id number]: [phone]: [phone]  Pre-screening  Ms. RMilichoffered "in-person" vs "virtual" encounter. She indicated preferring virtual for this encounter.   Reason COVID-19*  Social distancing based on CDC and AMA recommendations.   I contacted Kari Green 07/22/2020 via telephone.      I clearly identified myself as FGaspar Cola MD. I verified that I was speaking with the correct person using two identifiers (Name: PZAIRAH ARISTA and date of birth: [birth year].  Consent I sought verbal advanced consent from Kari Ramafor virtual visit interactions. I informed Ms. RSchaafof possible security and privacy concerns, risks, and limitations associated with providing "not-in-person" medical evaluation and management services. I also informed Ms. RWepplerof the availability of "in-person"  appointments. Finally, I informed her that there would be a charge for the virtual visit and that she could be  personally, fully or partially, financially responsible for it. Ms. RHostonexpressed understanding and agreed to proceed.   Historic Elements   Ms. PREYAH STREETERis a 55y.o. year old, female patient evaluated today after our last contact on 06/03/2020. Ms. RNorbeck has a past medical history of Anemia, Anxiety, Arthritis, Asthma, Avascular necrosis ([place], Basal cell carcinoma, Chronic migraine without aura, intractable, with status migrainosus, Chronic, continuous use of opioids, Complication of anesthesia, Degenerative disc disease, cervical, Degenerative disc disease, lumbar, Delayed gastric emptying, Difficult intravenous access, Difficulty swallowing pills, Enlarged lymph node in neck, Esophageal dysmotility, Family history of adverse reaction to anesthesia, Family history of breast cancer (08/29/2017), Family history of leukemia (08/29/2017), Family history of ovarian cancer (08/29/2017), Family history of prostate cancer (08/29/2017), Family history of thyroid cancer (08/29/2017), Fatty liver, Fibromyalgia, H/O blood transfusion reaction, Hemangioma of liver, Hemangioma, renal, Hiatal hernia, History of exercise stress test (05/08/2015), History of kidney stones (04/2014), History of skin cancer, Hypertension, IC (interstitial cystitis), Irritable bowel syndrome (IBS), Knee pain, right, Limited joint range of motion, Melanoma ([place], Memory impairment, Osteoporosis, Pain syndrome, chronic, Pneumonia, PONV (postoperative nausea and vomiting), Port-A-Cath in place (11/04/2016), Post-surgical hypothyroidism, Sciatica, Seasonal allergies, and Squamous carcinoma. She also  has a past surgical history that includes Total thyroidectomy (1993); cysto with hydrodistension (01/29/2011); Cystoscopy w/ retrogrades (01/29/2011); Fissurectomy (~ 1988 x2); Refractive surgery (Bilateral, ~ 1999; ~ 2013); Nasal  sinus surgery (2013); Laparoscopic lysis of adhesions (02/11/2012); Excision haglund's deformity with achilles tendon repair (Bilateral, 2013-2014); Melanoma excision (Left, 2013); Cystoscopy with stent placement (2006); Appendectomy (~ 1992); Tonsillectomy and adenoidectomy (~ 1989); LAPAROSCOPY ADHESIOLYSIS AND REMOVAL POSSIBLE RIGHT OVARY REMNANT (04/18/2000); Knee arthroscopy (Right, 08/16/2000); Carpal tunnel release (Bilateral,  right 07/26/2001;  left ?); DEBRIDEMENT RIGHT THUMB MP JOINT (Right, 04/10/2002); RIGHT THUMB FUSION OF MPJ (Right, 10/03/2002); DECOMPRESSION LEFT ULNAR NERVE, ELBOW (Left, 01/29/2003); RIGHT TOTAL KNEE REVISION ARTHROPLASTY (Right, 03/14/2007); CYSTO/ URETHRAL DILATION/ HYDRODISTENTION/ BLADDER BX (04/10/2003); Laparoscopic assisted vaginal hysterectomy (Feb 1999); LAPAROSCOPY LYSIS ADHESIONS/  BILATERAL SALPINGOOPHORECTOMY/  FULGERATION OF ENDOMETRIOSIS (1998); Anterior cervical decomp/discectomy fusion (03/10/2009); Esophagogastroduodenoscopy (egd) with esophageal dilation (10-26-2000); CYSTO/  BLADDER BX (03/08/2008); transthoracic echocardiogram (09/22/2012); cysto with hydrodistension (N/A, 05/08/2014); laparoscopy (02/11/2012); Transanal hemorrhoidal dearterialization (03/26/2011); Knee arthroscopy with fulkerson slide (Right, 11/29/2000); Laparoscopic cholecystectomy (03/04/2002); Total knee arthroplasty (Right, 09/10/2003; 12/08/2012); Carpometacarpel [facility]) suspension plasty (Right, 02/10/2015); Ganglion cyst excision (Right, 02/10/2015); NEGATIVE SLEEP STUDY (2006 approx .  per pt); cysto with hydrodistension (N/A, 03/27/2015); Kidney stone surgery; Anterior cervical decomp/discectomy fusion (N/A, 07/16/2015); Video bronchoscopy (Bilateral, 10/20/2015); Shoulder hemi-arthroplasty (Left, 12/04/2015); EUS (N/A, 04/23/2016); Trigger finger release (Left, 07/06/2016); IR FLUORO GUIDE PORT INSERTION RIGHT (11/04/2016); IR US Guide Vasc Access Right (11/04/2016); Shoulder arthroscopy (Left); Augmentation  mammaplasty (Bilateral, 1996; 2001, 2016); Breast surgery (age 3); Cystoscopy with hydrodistension and biopsy (N/A, 06/29/2017); sympathetic nerve ablation (07/05/2017); and Lymph node biopsy (Left, 07/24/2018). Ms. Oshima has a current medication list which includes the following prescription(s): alprazolam, atorvastatin, botox, cyanocobalamin, diclofenac sodium, diphenhydramine, estradiol, evzio, fluticasone, gabapentin, hydrochlorothiazide, hydromorphone, hydroxyzine, ipratropium, ketoconazole, ketorolac, dayvigo, levothyroxine, misc natural products, multivitamin with minerals, NON FORMULARY, onabotulinumtoxina, potassium chloride, prochlorperazine, promethazine, tizanidine, topiramate, vitamin c, and hydromorphone. She  reports that she has quit smoking. Her smoking use included cigarettes. She has never used smokeless tobacco. She reports previous alcohol use. She reports that she does not use drugs. Ms. Bawa is allergic to betadine [povidone-iodine], cobalt, contrast media [iodinated diagnostic agents], latex, nickel, peanuts [peanut oil], red dye, azithromycin, chloraprep one step [chlorhexidine gluconate], corticosteroids, flexeril [cyclobenzaprine], morphine and related, other, percocet [oxycodone-acetaminophen], supartz [sodium hyaluronate (avian)], surgical lubricant, tape, trazodone, yellow dye, yellow dyes (non-tartrazine), cephalexin, monistat [miconazole], prednisone, sulfa antibiotics, tea, and soap.   HPI  Today, she is being contacted for a post-procedure assessment.  The patient indicates that once more the radiofrequency ablation has provided her with excellent benefit where she is currently enjoying an 90% relief of her right lower extremity complex regional pain syndrome pain.  The patient indicates that currently the only area where she is certain is in the medial aspect of the knee, just below the patella suggesting that we may have to repeat the radiofrequency ablation of the  right inferior-medial genicular nerve.  This is a branch of the tibial nerve.  Post-Procedure Evaluation  Procedure (06/03/2020): Therapeutic right Lumbar sympathetic RFA #4 (NO STEROIDS), under fluoroscopic guidance and IV sedation Pre-procedure pain level: 8/10 Post-procedure: 0/10 (100% relief)  Sedation: Sedation provided.  Effectiveness during initial hour after procedure(Ultra-Short Term Relief): 100 %.  Local anesthetic used: Long-acting (4-6 hours) Effectiveness: Defined as any analgesic benefit obtained secondary to the administration of local anesthetics. This carries significant diagnostic value as to the etiological location, or anatomical origin, of the pain. Duration of benefit is expected to coincide with the duration of the local anesthetic used.  Effectiveness during initial 4-6 hours after procedure(Short-Term Relief): 100 %.  Long-term benefit: Defined as any relief past the pharmacologic duration of the local anesthetics.  Effectiveness past the initial 6 hours after procedure(Long-Term Relief): 90 %.  Current benefits: Defined as benefit that persist at this time.   Analgesia:  The patient indicates having attained an ongoing 90% relief of the pain on the right lower extremity.  Function: Ms. Smet reports improvement in function ROM: Ms. Mccranie reports improvement in ROM  Pharmacotherapy Assessment  Analgesic: No opioid analgesics prescribed by our practice.  The last time that we checked, the patient was getting her pain medications from the [facility].   Monitoring: [place] PMP: PDMP reviewed during this encounter.       Pharmacotherapy: No side-effects or adverse reactions reported. Compliance: No problems identified. Effectiveness: Clinically acceptable. Plan: Refer to "POC".  UDS: No results found for: SUMMARY  Laboratory Chemistry Profile   Renal Lab Results  Component Value Date   BUN 22 (H) 04/29/2020   CREATININE 1.11 (H) 04/29/2020    GFRAA >60 03/23/2019   GFRNONAA 59 (L) 04/29/2020     Hepatic Lab Results  Component Value Date   AST 84 (H) 04/29/2020   ALT 35 04/29/2020   ALBUMIN 4.3 04/29/2020   ALKPHOS 85 04/29/2020   AMYLASE 73 03/04/2020   LIPASE 37 04/29/2020     Electrolytes Lab Results  Component Value Date   NA 137 04/29/2020   K 3.2 (L) 04/29/2020   CL 103 04/29/2020   CALCIUM 8.5 (L) 04/29/2020     Bone No results found for: VD25OH, [id number], [id number], [id number], 25OHVITD1, 25OHVITD2, 25OHVITD3, TESTOFREE, TESTOSTERONE   Inflammation (CRP: Acute Phase) (ESR: Chronic Phase) Lab Results  Component Value Date   ESRSEDRATE 15 09/14/2018   LATICACIDVEN 1.75 09/08/2013       Note: Above Lab results reviewed.  Imaging  DG PAIN CLINIC C-ARM 1-60 MIN NO REPORT Fluoro was used, but no Radiologist interpretation will be provided.  Please refer to "NOTES" tab for provider progress note.  Assessment  The primary encounter diagnosis was CRPS (complex regional pain syndrome) type I of lower limb (Right). 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  Plan of Care  Problem-specific:  No problem-specific Assessment & Plan notes found for this encounter.  Ms. VENETA SLITER has a current medication list which includes the following long-term medication(s): atorvastatin, estradiol, fluticasone, hydrochlorothiazide, hydromorphone, ipratropium, potassium chloride, prochlorperazine, promethazine, and hydromorphone.  Pharmacotherapy (Medications Ordered): No orders of the defined types were placed in this encounter.  Orders:  Orders Placed This Encounter  Procedures  . Radiofrequency,Genicular    Standing Status:   Future    Standing Expiration Date:   07/22/2021    Scheduling Instructions:      Side(s): Right Knee     Level(s): Inferior-Medial Genicular Nerve     Sedation: With Sedation.     Scheduling Timeframe: As soon as pre-approved    Order Specific Question:   Where will this procedure be performed?    Answer:   [facility]   Follow-up plan:   Return for RFA (81mn): (R) Inferior-Medial Genicular Nerve RFA.      Interventional Therapies  Risk  Complexity Considerations:   Latex allergy  Steroid allergy  [facility]  Planned  Pending:   Palliative, limited, right inferior-medial genicular nerve RFA    Under consideration:   Diagnostic bilateral SI joint injection  Therapeutic bilateral SI joint RFA  Diagnostic bilateral lumbar facet block  Therapeutic bilateral lumbar facet RFA  Diagnostic bilateral IA hip joint injection    Completed:   Palliative right genicular nerves RFA x4 (  03/18/2020)  Diagnostic right L3, L4 lumbar sympathetic Block x1 (04/08/2020)  Palliative right L3, L4 lumbar sympathetic RFA x3 (07/05/2017)    Therapeutic  Palliative (PRN) options:   Palliative right genicular nerve RFA #5  Palliative right L3 & L4 lumbar sympathetic RFA #4     Recent Visits Date Type Provider Dept  06/03/20 Procedure visit Milinda Pointer, MD [facility]  05/12/20 Office Visit Milinda Pointer, MD [facility]  Showing recent visits within past 90 days and meeting all other requirements Today's Visits Date Type Provider Dept  07/22/20 Telemedicine Milinda Pointer, MD [facility]  Showing today's visits and meeting all other requirements Future Appointments No visits were found meeting these conditions. Showing future appointments within next 90 days and meeting all other requirements  I discussed the assessment and treatment plan with the patient. The patient was provided an opportunity to ask questions and all were answered. The patient agreed with the plan and  demonstrated an understanding of the instructions.  Patient advised to call back or seek an in-person evaluation if the symptoms or condition worsens.  Duration of encounter: 18 minutes.  Note by: Gaspar Cola, MD Date: 07/22/2020; Time: 4:59 PM

## 2020-07-22 ENCOUNTER — Other Ambulatory Visit: Payer: Self-pay

## 2020-07-22 ENCOUNTER — Ambulatory Visit: Payer: BC Managed Care – PPO | Attending: Pain Medicine | Admitting: Pain Medicine

## 2020-07-22 ENCOUNTER — Encounter: Payer: Self-pay | Admitting: Pain Medicine

## 2020-07-22 DIAGNOSIS — M79604 Pain in right leg: Secondary | ICD-10-CM | POA: Diagnosis not present

## 2020-07-22 DIAGNOSIS — G90521 Complex regional pain syndrome I of right lower limb: Secondary | ICD-10-CM

## 2020-07-22 DIAGNOSIS — Z96651 Presence of right artificial knee joint: Secondary | ICD-10-CM

## 2020-07-22 DIAGNOSIS — M792 Neuralgia and neuritis, unspecified: Secondary | ICD-10-CM | POA: Diagnosis not present

## 2020-07-22 DIAGNOSIS — G8921 Chronic pain due to trauma: Secondary | ICD-10-CM | POA: Diagnosis not present

## 2020-07-22 DIAGNOSIS — R208 Other disturbances of skin sensation: Secondary | ICD-10-CM | POA: Diagnosis not present

## 2020-07-22 DIAGNOSIS — M25561 Pain in right knee: Secondary | ICD-10-CM

## 2020-07-22 DIAGNOSIS — G8929 Other chronic pain: Secondary | ICD-10-CM

## 2020-07-22 NOTE — Patient Instructions (Signed)
____________________________________________________________________________________________  Preparing for Procedure with Sedation  Procedure appointments are limited to planned procedures: . No Prescription Refills. . No disability issues will be discussed. . No medication changes will be discussed.  Instructions: . Oral Intake: Do not eat or drink anything for at least 8 hours prior to your procedure. (Exception: Blood Pressure Medication. See below.) . Transportation: Unless otherwise stated by your physician, you may drive yourself after the procedure. . Blood Pressure Medicine: Do not forget to take your blood pressure medicine with a sip of water the morning of the procedure. If your Diastolic (lower reading)is above 100 mmHg, elective cases will be cancelled/rescheduled. . Blood thinners: These will need to be stopped for procedures. Notify our staff if you are taking any blood thinners. Depending on which one you take, there will be specific instructions on how and when to stop it. . Diabetics on insulin: Notify the staff so that you can be scheduled 1st case in the morning. If your diabetes requires high dose insulin, take only  of your normal insulin dose the morning of the procedure and notify the staff that you have done so. . Preventing infections: Shower with an antibacterial soap the morning of your procedure. . Build-up your immune system: Take 1000 mg of Vitamin C with every meal (3 times a day) the day prior to your procedure. . Antibiotics: Inform the staff if you have a condition or reason that requires you to take antibiotics before dental procedures. . Pregnancy: If you are pregnant, call and cancel the procedure. . Sickness: If you have a cold, fever, or any active infections, call and cancel the procedure. . Arrival: You must be in the facility at least 30 minutes prior to your scheduled procedure. . Children: Do not bring children with you. . Dress appropriately:  Bring dark clothing that you would not mind if they get stained. . Valuables: Do not bring any jewelry or valuables.  Reasons to call and reschedule or cancel your procedure: (Following these recommendations will minimize the risk of a serious complication.) . Surgeries: Avoid having procedures within 2 weeks of any surgery. (Avoid for 2 weeks before or after any surgery). . Flu Shots: Avoid having procedures within 2 weeks of a flu shots or . (Avoid for 2 weeks before or after immunizations). . Barium: Avoid having a procedure within 7-10 days after having had a radiological study involving the use of radiological contrast. (Myelograms, Barium swallow or enema study). . Heart attacks: Avoid any elective procedures or surgeries for the initial 6 months after a "Myocardial Infarction" (Heart Attack). . Blood thinners: It is imperative that you stop these medications before procedures. Let us know if you if you take any blood thinner.  . Infection: Avoid procedures during or within two weeks of an infection (including chest colds or gastrointestinal problems). Symptoms associated with infections include: Localized redness, fever, chills, night sweats or profuse sweating, burning sensation when voiding, cough, congestion, stuffiness, runny nose, sore throat, diarrhea, nausea, vomiting, cold or Flu symptoms, recent or current infections. It is specially important if the infection is over the area that we intend to treat. . Heart and lung problems: Symptoms that may suggest an active cardiopulmonary problem include: cough, chest pain, breathing difficulties or shortness of breath, dizziness, ankle swelling, uncontrolled high or unusually low blood pressure, and/or palpitations. If you are experiencing any of these symptoms, cancel your procedure and contact your primary care physician for an evaluation.  Remember:  Regular Business hours are:    Monday to Thursday 8:00 AM to 4:00 PM  Provider's  Schedule: Shubh Chiara, MD:  Procedure days: Tuesday and Thursday 7:30 AM to 4:00 PM  Bilal Lateef, MD:  Procedure days: Monday and Wednesday 7:30 AM to 4:00 PM ____________________________________________________________________________________________   ____________________________________________________________________________________________  General Risks and Possible Complications  Patient Responsibilities: It is important that you read this as it is part of your informed consent. It is our duty to inform you of the risks and possible complications associated with treatments offered to you. It is your responsibility as a patient to read this and to ask questions about anything that is not clear or that you believe was not covered in this document.  Patient's Rights: You have the right to refuse treatment. You also have the right to change your mind, even after initially having agreed to have the treatment done. However, under this last option, if you wait until the last second to change your mind, you may be charged for the materials used up to that point.  Introduction: Medicine is not an exact science. Everything in Medicine, including the lack of treatment(s), carries the potential for danger, harm, or loss (which is by definition: Risk). In Medicine, a complication is a secondary problem, condition, or disease that can aggravate an already existing one. All treatments carry the risk of possible complications. The fact that a side effects or complications occurs, does not imply that the treatment was conducted incorrectly. It must be clearly understood that these can happen even when everything is done following the highest safety standards.  No treatment: You can choose not to proceed with the proposed treatment alternative. The "PRO(s)" would include: avoiding the risk of complications associated with the therapy. The "CON(s)" would include: not getting any of the treatment  benefits. These benefits fall under one of three categories: diagnostic; therapeutic; and/or palliative. Diagnostic benefits include: getting information which can ultimately lead to improvement of the disease or symptom(s). Therapeutic benefits are those associated with the successful treatment of the disease. Finally, palliative benefits are those related to the decrease of the primary symptoms, without necessarily curing the condition (example: decreasing the pain from a flare-up of a chronic condition, such as incurable terminal cancer).  General Risks and Complications: These are associated to most interventional treatments. They can occur alone, or in combination. They fall under one of the following six (6) categories: no benefit or worsening of symptoms; bleeding; infection; nerve damage; allergic reactions; and/or death. 1. No benefits or worsening of symptoms: In Medicine there are no guarantees, only probabilities. No healthcare provider can ever guarantee that a medical treatment will work, they can only state the probability that it may. Furthermore, there is always the possibility that the condition may worsen, either directly, or indirectly, as a consequence of the treatment. 2. Bleeding: This is more common if the patient is taking a blood thinner, either prescription or over the counter (example: Goody Powders, Fish oil, Aspirin, Garlic, etc.), or if suffering a condition associated with impaired coagulation (example: Hemophilia, cirrhosis of the liver, low platelet counts, etc.). However, even if you do not have one on these, it can still happen. If you have any of these conditions, or take one of these drugs, make sure to notify your treating physician. 3. Infection: This is more common in patients with a compromised immune system, either due to disease (example: diabetes, cancer, human immunodeficiency virus [HIV], etc.), or due to medications or treatments (example: therapies used to treat  cancer and   rheumatological diseases). However, even if you do not have one on these, it can still happen. If you have any of these conditions, or take one of these drugs, make sure to notify your treating physician. 4. Nerve Damage: This is more common when the treatment is an invasive one, but it can also happen with the use of medications, such as those used in the treatment of cancer. The damage can occur to small secondary nerves, or to large primary ones, such as those in the spinal cord and brain. This damage may be temporary or permanent and it may lead to impairments that can range from temporary numbness to permanent paralysis and/or brain death. 5. Allergic Reactions: Any time a substance or material comes in contact with our body, there is the possibility of an allergic reaction. These can range from a mild skin rash (contact dermatitis) to a severe systemic reaction (anaphylactic reaction), which can result in death. 6. Death: In general, any medical intervention can result in death, most of the time due to an unforeseen complication. ____________________________________________________________________________________________   

## 2020-07-24 DIAGNOSIS — K648 Other hemorrhoids: Secondary | ICD-10-CM | POA: Diagnosis not present

## 2020-07-24 DIAGNOSIS — M7989 Other specified soft tissue disorders: Secondary | ICD-10-CM | POA: Diagnosis not present

## 2020-07-28 ENCOUNTER — Other Ambulatory Visit: Payer: Self-pay | Admitting: Emergency Medicine

## 2020-07-31 ENCOUNTER — Ambulatory Visit: Payer: BC Managed Care – PPO | Attending: Pain Medicine | Admitting: Pain Medicine

## 2020-07-31 ENCOUNTER — Ambulatory Visit: Payer: BC Managed Care – PPO | Admitting: Pain Medicine

## 2020-07-31 DIAGNOSIS — X58XXXA Exposure to other specified factors, initial encounter: Secondary | ICD-10-CM | POA: Diagnosis not present

## 2020-07-31 DIAGNOSIS — R1030 Lower abdominal pain, unspecified: Secondary | ICD-10-CM | POA: Diagnosis not present

## 2020-07-31 DIAGNOSIS — M25461 Effusion, right knee: Secondary | ICD-10-CM | POA: Diagnosis not present

## 2020-07-31 DIAGNOSIS — G43909 Migraine, unspecified, not intractable, without status migrainosus: Secondary | ICD-10-CM | POA: Diagnosis not present

## 2020-07-31 DIAGNOSIS — R9431 Abnormal electrocardiogram [ECG] [EKG]: Secondary | ICD-10-CM | POA: Diagnosis not present

## 2020-07-31 DIAGNOSIS — T8453XD Infection and inflammatory reaction due to internal right knee prosthesis, subsequent encounter: Secondary | ICD-10-CM | POA: Diagnosis not present

## 2020-07-31 DIAGNOSIS — G9050 Complex regional pain syndrome I, unspecified: Secondary | ICD-10-CM | POA: Diagnosis not present

## 2020-07-31 DIAGNOSIS — R109 Unspecified abdominal pain: Secondary | ICD-10-CM | POA: Diagnosis not present

## 2020-07-31 DIAGNOSIS — N179 Acute kidney failure, unspecified: Secondary | ICD-10-CM | POA: Diagnosis not present

## 2020-07-31 DIAGNOSIS — E039 Hypothyroidism, unspecified: Secondary | ICD-10-CM | POA: Diagnosis not present

## 2020-07-31 DIAGNOSIS — I9581 Postprocedural hypotension: Secondary | ICD-10-CM | POA: Diagnosis not present

## 2020-07-31 DIAGNOSIS — F411 Generalized anxiety disorder: Secondary | ICD-10-CM | POA: Diagnosis not present

## 2020-07-31 DIAGNOSIS — I10 Essential (primary) hypertension: Secondary | ICD-10-CM | POA: Diagnosis not present

## 2020-07-31 DIAGNOSIS — E890 Postprocedural hypothyroidism: Secondary | ICD-10-CM | POA: Diagnosis not present

## 2020-07-31 DIAGNOSIS — R509 Fever, unspecified: Secondary | ICD-10-CM | POA: Diagnosis not present

## 2020-07-31 DIAGNOSIS — Z889 Allergy status to unspecified drugs, medicaments and biological substances status: Secondary | ICD-10-CM | POA: Diagnosis not present

## 2020-07-31 DIAGNOSIS — R197 Diarrhea, unspecified: Secondary | ICD-10-CM | POA: Diagnosis not present

## 2020-07-31 DIAGNOSIS — T8453XA Infection and inflammatory reaction due to internal right knee prosthesis, initial encounter: Secondary | ICD-10-CM | POA: Diagnosis not present

## 2020-07-31 DIAGNOSIS — M25561 Pain in right knee: Secondary | ICD-10-CM | POA: Diagnosis not present

## 2020-07-31 DIAGNOSIS — Z809 Family history of malignant neoplasm, unspecified: Secondary | ICD-10-CM | POA: Diagnosis not present

## 2020-07-31 DIAGNOSIS — M797 Fibromyalgia: Secondary | ICD-10-CM | POA: Diagnosis not present

## 2020-07-31 DIAGNOSIS — G8918 Other acute postprocedural pain: Secondary | ICD-10-CM | POA: Diagnosis not present

## 2020-07-31 DIAGNOSIS — Z96651 Presence of right artificial knee joint: Secondary | ICD-10-CM | POA: Diagnosis not present

## 2020-07-31 DIAGNOSIS — F5105 Insomnia due to other mental disorder: Secondary | ICD-10-CM | POA: Diagnosis not present

## 2020-07-31 DIAGNOSIS — Z79890 Hormone replacement therapy (postmenopausal): Secondary | ICD-10-CM | POA: Diagnosis not present

## 2020-07-31 DIAGNOSIS — J449 Chronic obstructive pulmonary disease, unspecified: Secondary | ICD-10-CM | POA: Diagnosis not present

## 2020-07-31 DIAGNOSIS — K029 Dental caries, unspecified: Secondary | ICD-10-CM | POA: Diagnosis not present

## 2020-07-31 DIAGNOSIS — E785 Hyperlipidemia, unspecified: Secondary | ICD-10-CM | POA: Diagnosis not present

## 2020-07-31 DIAGNOSIS — M25519 Pain in unspecified shoulder: Secondary | ICD-10-CM | POA: Diagnosis not present

## 2020-07-31 NOTE — Progress Notes (Deleted)
Patient called and canceled approximately half an hour prior to the procedure.

## 2020-08-01 ENCOUNTER — Other Ambulatory Visit: Payer: Self-pay | Admitting: Obstetrics and Gynecology

## 2020-08-04 ENCOUNTER — Other Ambulatory Visit: Payer: Self-pay | Admitting: Obstetrics and Gynecology

## 2020-08-04 DIAGNOSIS — N644 Mastodynia: Secondary | ICD-10-CM

## 2020-08-07 ENCOUNTER — Other Ambulatory Visit (HOSPITAL_BASED_OUTPATIENT_CLINIC_OR_DEPARTMENT_OTHER): Payer: Self-pay

## 2020-08-07 ENCOUNTER — Telehealth: Payer: Self-pay | Admitting: Neurology

## 2020-08-07 DIAGNOSIS — T8453XA Infection and inflammatory reaction due to internal right knee prosthesis, initial encounter: Secondary | ICD-10-CM | POA: Diagnosis not present

## 2020-08-07 NOTE — Telephone Encounter (Signed)
Patient has a Botox appointment 6/21. I called [facility] [phone]) and spoke with Dorothea Ogle to order Botox. Botox TBD 6/21.

## 2020-08-08 ENCOUNTER — Encounter (HOSPITAL_COMMUNITY): Payer: BC Managed Care – PPO

## 2020-08-08 DIAGNOSIS — Z9181 History of falling: Secondary | ICD-10-CM | POA: Diagnosis not present

## 2020-08-08 DIAGNOSIS — G9050 Complex regional pain syndrome I, unspecified: Secondary | ICD-10-CM | POA: Diagnosis not present

## 2020-08-08 DIAGNOSIS — I10 Essential (primary) hypertension: Secondary | ICD-10-CM | POA: Diagnosis not present

## 2020-08-08 DIAGNOSIS — M26609 Unspecified temporomandibular joint disorder, unspecified side: Secondary | ICD-10-CM | POA: Diagnosis not present

## 2020-08-08 DIAGNOSIS — M1990 Unspecified osteoarthritis, unspecified site: Secondary | ICD-10-CM | POA: Diagnosis not present

## 2020-08-08 DIAGNOSIS — M797 Fibromyalgia: Secondary | ICD-10-CM | POA: Diagnosis not present

## 2020-08-08 DIAGNOSIS — E785 Hyperlipidemia, unspecified: Secondary | ICD-10-CM | POA: Diagnosis not present

## 2020-08-08 DIAGNOSIS — T8453XA Infection and inflammatory reaction due to internal right knee prosthesis, initial encounter: Secondary | ICD-10-CM | POA: Diagnosis not present

## 2020-08-08 DIAGNOSIS — E039 Hypothyroidism, unspecified: Secondary | ICD-10-CM | POA: Diagnosis not present

## 2020-08-08 DIAGNOSIS — M879 Osteonecrosis, unspecified: Secondary | ICD-10-CM | POA: Diagnosis not present

## 2020-08-08 DIAGNOSIS — J449 Chronic obstructive pulmonary disease, unspecified: Secondary | ICD-10-CM | POA: Diagnosis not present

## 2020-08-08 DIAGNOSIS — G43909 Migraine, unspecified, not intractable, without status migrainosus: Secondary | ICD-10-CM | POA: Diagnosis not present

## 2020-08-08 DIAGNOSIS — F411 Generalized anxiety disorder: Secondary | ICD-10-CM | POA: Diagnosis not present

## 2020-08-11 ENCOUNTER — Ambulatory Visit: Payer: Medicare Other | Admitting: Family Medicine

## 2020-08-11 DIAGNOSIS — E039 Hypothyroidism, unspecified: Secondary | ICD-10-CM | POA: Diagnosis not present

## 2020-08-11 DIAGNOSIS — Z9181 History of falling: Secondary | ICD-10-CM | POA: Diagnosis not present

## 2020-08-11 DIAGNOSIS — G43909 Migraine, unspecified, not intractable, without status migrainosus: Secondary | ICD-10-CM | POA: Diagnosis not present

## 2020-08-11 DIAGNOSIS — I10 Essential (primary) hypertension: Secondary | ICD-10-CM | POA: Diagnosis not present

## 2020-08-11 DIAGNOSIS — G9050 Complex regional pain syndrome I, unspecified: Secondary | ICD-10-CM | POA: Diagnosis not present

## 2020-08-11 DIAGNOSIS — M1990 Unspecified osteoarthritis, unspecified site: Secondary | ICD-10-CM | POA: Diagnosis not present

## 2020-08-11 DIAGNOSIS — M26609 Unspecified temporomandibular joint disorder, unspecified side: Secondary | ICD-10-CM | POA: Diagnosis not present

## 2020-08-11 DIAGNOSIS — J449 Chronic obstructive pulmonary disease, unspecified: Secondary | ICD-10-CM | POA: Diagnosis not present

## 2020-08-11 DIAGNOSIS — M879 Osteonecrosis, unspecified: Secondary | ICD-10-CM | POA: Diagnosis not present

## 2020-08-11 DIAGNOSIS — F411 Generalized anxiety disorder: Secondary | ICD-10-CM | POA: Diagnosis not present

## 2020-08-11 DIAGNOSIS — M797 Fibromyalgia: Secondary | ICD-10-CM | POA: Diagnosis not present

## 2020-08-11 DIAGNOSIS — E785 Hyperlipidemia, unspecified: Secondary | ICD-10-CM | POA: Diagnosis not present

## 2020-08-11 DIAGNOSIS — T8453XA Infection and inflammatory reaction due to internal right knee prosthesis, initial encounter: Secondary | ICD-10-CM | POA: Diagnosis not present

## 2020-08-12 ENCOUNTER — Other Ambulatory Visit: Payer: Self-pay

## 2020-08-12 ENCOUNTER — Ambulatory Visit (INDEPENDENT_AMBULATORY_CARE_PROVIDER_SITE_OTHER): Payer: BC Managed Care – PPO | Admitting: Neurology

## 2020-08-12 DIAGNOSIS — G43709 Chronic migraine without aura, not intractable, without status migrainosus: Secondary | ICD-10-CM

## 2020-08-12 MED ORDER — RIZATRIPTAN BENZOATE 10 MG PO TBDP: 10.0000 mg | ORAL_TABLET | ORAL | 11 refills | Status: DC | PRN

## 2020-08-12 MED ORDER — UBRELVY 100 MG PO TABS: 100.0000 mg | ORAL_TABLET | ORAL | 0 refills | Status: DC | PRN

## 2020-08-12 NOTE — Patient Instructions (Signed)
Rizatriptan: Please take one tablet at the onset of your headache. If it does not improve the symptoms please take one additional tablet. Do not take more then 2 tablets in 24hrs. Do not take use more then 2 to 3 times in a week.  Kari Green: Please take one tablet at the onset of your headache. If it does not improve the symptoms please take one additional tablet. Do not take more then 2 tablets in 24hrs. Do not take use more then 2 to 3 times in a week.  Rizatriptan Disintegrating Tablets What is this medication? RIZATRIPTAN (rye za TRIP tan) treats migraines. It works by blocking pain signals and narrowing blood vessels in the brain. It belongs to a group ofmedications called triptans. It is not used to prevent migraines. This medicine may be used for other purposes; ask your health care provider orpharmacist if you have questions. COMMON BRAND NAME(S): Maxalt-MLT What should I tell my care team before I take this medication? They need to know if you have any of these conditions: Cigarette smoker Circulation problems in fingers and toes Diabetes Heart disease High blood pressure High cholesterol History of irregular heartbeat History of stroke Kidney disease Liver disease Stomach or intestine problems An unusual or allergic reaction to rizatriptan, other medications, foods, dyes, or preservatives Pregnant or trying to get pregnant Breast-feeding How should I use this medication? Take this medication by mouth. Follow the directions on the prescription label. Leave the tablet in the sealed blister pack until you are ready to take it. With dry hands, open the blister and gently remove the tablet. If the tablet breaks or crumbles, throw it away and take a new tablet out of the blister pack. Place the tablet in the mouth and allow it to dissolve, and then swallow. Do not cut, crush, or chew this medication. You do not need water to take thismedication. Do not take it more often than  directed. Talk to your care team regarding the use of this medication in children. While this medication may be prescribed for children as young as 6 years for selectedconditions, precautions do apply. Overdosage: If you think you have taken too much of this medicine contact apoison control center or emergency room at once. NOTE: This medicine is only for you. Do not share this medicine with others. What if I miss a dose? This does not apply. This medication is not for regular use. What may interact with this medication? Do not take this medication with any of the following medications: Certain medications for migraine headache like almotriptan, eletriptan, frovatriptan, naratriptan, rizatriptan, sumatriptan, zolmitriptan Ergot alkaloids like dihydroergotamine, ergonovine, ergotamine, methylergonovine MAOIs like Carbex, Eldepryl, Marplan, Nardil, and Parnate This medication may also interact with the following medications: Certain medications for depression, anxiety, or psychotic disorders Propranolol This list may not describe all possible interactions. Give your health care provider a list of all the medicines, herbs, non-prescription drugs, or dietary supplements you use. Also tell them if you smoke, drink alcohol, or use illegaldrugs. Some items may interact with your medicine. What should I watch for while using this medication? Visit your care team for regular checks on your progress. Tell your care teamif your symptoms do not start to get better or if they get worse. You may get drowsy or dizzy. Do not drive, use machinery, or do anything that needs mental alertness until you know how this medication affects you. Do not stand up or sit up quickly, especially if you are an older patient.  This reduces the risk of dizzy or fainting spells. Alcohol may interfere with theeffect of this medication. Your mouth may get dry. Chewing sugarless gum or sucking hard candy and drinking plenty of water may  help. Contact your care team if the problem doesnot go away or is severe. If you take migraine medications for 10 or more days a month, your migraines may get worse. Keep a diary of headache days and medication use. Contact yourcare team if your migraine attacks occur more frequently. What side effects may I notice from receiving this medication? Side effects that you should report to your care team as soon as possible: Allergic reactions-skin rash, itching, hives, swelling of the face, lips, tongue, or throat Burning, pain, tingling, or color changes in the legs or feet Heart attack-pain or tightness in the chest, shoulders, arms, or jaw, nausea, shortness of breath, cold or clammy skin, feeling faint or lightheaded Heart rhythm changes-fast or irregular heartbeat, dizziness, feeling faint or lightheaded, chest pain, trouble breathing Increase in blood pressure Irritability, confusion, fast or irregular heartbeat, muscle stiffness, twitching muscles, sweating, high fever, seizure, chills, vomiting, diarrhea, which may be signs of serotonin syndrome Raynaud's-cool, numb, or painful fingers or toes that may change color from pale, to blue, to red Seizures Stroke-sudden numbness or weakness of the face, arm, or leg, trouble speaking, confusion, trouble walking, loss of balance or coordination, dizziness, severe headache, change in vision Sudden or severe stomach pain, nausea, vomiting, fever, or bloody diarrhea Vision loss Side effects that usually do not require medical attention (report to your careteam if they continue or are bothersome): Dizziness General discomfort or fatigue This list may not describe all possible side effects. Call your doctor for medical advice about side effects. You may report side effects to FDA at1-800-FDA-1088. Where should I keep my medication? Keep out of the reach of children and pets. Store at room temperature between 15 and 30 degrees C (59 and 86 degrees F).  Protect from light and moisture. Throw away any unused medication after theexpiration date. NOTE: This sheet is a summary. It may not cover all possible information. If you have questions about this medicine, talk to your doctor, pharmacist, orhealth care provider.  2022 Elsevier/Gold Standard (2020-03-05 16:19:19) Ubrogepant tablets What is this medication? UBROGEPANT (ue BROE je pant) is used to treat migraine headaches with or without aura. An aura is a strange feeling or visual disturbance that warns youof an attack. It is not used to prevent migraines. This medicine may be used for other purposes; ask your health care provider orpharmacist if you have questions. COMMON BRAND NAME(S): Kari Green What should I tell my care team before I take this medication? They need to know if you have any of these conditions: kidney disease liver disease an unusual or allergic reaction to ubrogepant, other medicines, foods, dyes, or preservatives pregnant or trying to get pregnant breast-feeding How should I use this medication? Take this medicine by mouth with a glass of water. Follow the directions on the prescription label. You can take it with or without food. If it upsets your stomach, take it with food. Take your medicine at regular intervals. Do not take it more often than directed. Do not stop taking except on your doctor'sadvice. Talk to your pediatrician about the use of this medicine in children. Specialcare may be needed. Overdosage: If you think you have taken too much of this medicine contact apoison control center or emergency room at once. NOTE: This medicine is only  for you. Do not share this medicine with others. What if I miss a dose? This does not apply. This medicine is not for regular use. What may interact with this medication? Do not take this medicine with any of the following medicines: ceritinib certain antibiotics like chloramphenicol, clarithromycin, telithromycin certain  antivirals for HIV like atazanavir, cobicistat, darunavir, delavirdine, fosamprenavir, indinavir, ritonavir certain medicines for fungal infections like itraconazole, ketoconazole, posaconazole, voriconazole conivaptan grapefruit idelalisib mifepristone nefazodone ribociclib This medicine may also interact with the following medications: carvedilol certain medicines for seizures like phenobarbital, phenytoin ciprofloxacin cyclosporine eltrombopag fluconazole fluvoxamine quinidine rifampin St. John's wort verapamil This list may not describe all possible interactions. Give your health care provider a list of all the medicines, herbs, non-prescription drugs, or dietary supplements you use. Also tell them if you smoke, drink alcohol, or use illegaldrugs. Some items may interact with your medicine. What should I watch for while using this medication? Visit your health care professional for regular checks on your progress. Tell your health care professional if your symptoms do not start to get better or ifthey get worse. Your mouth may get dry. Chewing sugarless gum or sucking hard candy and drinking plenty of water may help. Contact your health care professional if theproblem does not go away or is severe. What side effects may I notice from receiving this medication? Side effects that you should report to your doctor or health care professionalas soon as possible: allergic reactions like skin rash, itching or hives; swelling of the face, lips, or tongue Side effects that usually do not require medical attention (report these toyour doctor or health care professional if they continue or are bothersome): drowsiness dry mouth nausea tiredness This list may not describe all possible side effects. Call your doctor for medical advice about side effects. You may report side effects to FDA at1-800-FDA-1088. Where should I keep my medication? Keep out of the reach of children. Store at room  temperature between 15 and 30 degrees C (59 and 86 degrees F). Throw away any unused medicine after theexpiration date. NOTE: This sheet is a summary. It may not cover all possible information. If you have questions about this medicine, talk to your doctor, pharmacist, orhealth care provider.  2022 Elsevier/Gold Standard (2018-04-27 08:50:55)

## 2020-08-12 NOTE — Progress Notes (Signed)
Botox- 100 units x 2 vials Lot: [id number] Expiration: 07/2022 NDC: 3704-8889-16  Bacteriostatic 0.9% Sodium Chloride- 47mL total Lot: [id number] Expiration: 09/12/2020 NDC: 8828-0034-91  Dx: P91.505 S/P

## 2020-08-12 NOTE — Progress Notes (Signed)
Consent Form Botulism Toxin Injection For Chronic Migraine  6/21.2022: She is here after 1 week in the hospital for right septic knee. Did not do the cervical muscles, her migraines are more around the eyes so did injections higher in the forehead as well as lower, around the bridge of the nose, medial and lateral corrugators, 2 x 5 units procerus and 15 each orb oculi (migraines centered around eyes and forehead)  Reviewed orally with patient, additionally signature is on file:  Botulism toxin has been approved by the Federal drug administration for treatment of chronic migraine. Botulism toxin does not cure chronic migraine and it may not be effective in some patients.  The administration of botulism toxin is accomplished by injecting a small amount of toxin into the muscles of the neck and head. Dosage must be titrated for each individual. Any benefits resulting from botulism toxin tend to wear off after 3 months with a repeat injection required if benefit is to be maintained. Injections are usually done every 3-4 months with maximum effect peak achieved by about 2 or 3 weeks. Botulism toxin is expensive and you should be sure of what costs you will incur resulting from the injection.  The side effects of botulism toxin use for chronic migraine may include:   -Transient, and usually mild, facial weakness with facial injections  -Transient, and usually mild, head or neck weakness with head/neck injections  -Reduction or loss of forehead facial animation due to forehead muscle weakness  -Eyelid drooping  -Dry eye  -Pain at the site of injection or bruising at the site of injection  -Double vision  -Potential unknown long term risks  Contraindications: You should not have Botox if you are pregnant, nursing, allergic to albumin, have an infection, skin condition, or muscle weakness at the site of the injection, or have myasthenia gravis, Lambert-Eaton syndrome, or ALS.  It is also possible  that as with any injection, there may be an allergic reaction or no effect from the medication. Reduced effectiveness after repeated injections is sometimes seen and rarely infection at the injection site may occur. All care will be taken to prevent these side effects. If therapy is given over a long time, atrophy and wasting in the muscle injected may occur. Occasionally the patient's become refractory to treatment because they develop antibodies to the toxin. In this event, therapy needs to be modified.  I have read the above information and consent to the administration of botulism toxin.    BOTOX PROCEDURE NOTE FOR MIGRAINE HEADACHE    Contraindications and precautions discussed with patient(above). Aseptic procedure was observed and patient tolerated procedure. Procedure performed by Dr. Georgia Dom  The condition has existed for more than 6 months, and pt does not have a diagnosis of ALS, Myasthenia Gravis or Lambert-Eaton Syndrome.  Risks and benefits of injections discussed and pt agrees to proceed with the procedure.  Written consent obtained  These injections are medically necessary. Pt  receives good benefits from these injections. These injections do not cause sedations or hallucinations which the oral therapies may cause.  Description of procedure:  The patient was placed in a sitting position. The standard protocol was used for Botox as follows, with 5 units of Botox injected at each site:   -Procerus muscle, midline injection  -Corrugator muscle, bilateral injection  -Frontalis muscle, bilateral injection, with 2 sites each side, medial injection was performed in the upper one third of the frontalis muscle, in the region vertical from the medial  inferior edge of the superior orbital rim. The lateral injection was again in the upper one third of the forehead vertically above the lateral limbus of the cornea, 1.5 cm lateral to the medial injection site.  -Temporalis muscle  injection, 4 sites, bilaterally. The first injection was 3 cm above the tragus of the ear, second injection site was 1.5 cm to 3 cm up from the first injection site in line with the tragus of the ear. The third injection site was 1.5-3 cm forward between the first 2 injection sites. The fourth injection site was 1.5 cm posterior to the second injection site.   -Occipitalis muscle injection, 3 sites, bilaterally. The first injection was done one half way between the occipital protuberance and the tip of the mastoid process behind the ear. The second injection site was done lateral and superior to the first, 1 fingerbreadth from the first injection. The third injection site was 1 fingerbreadth superiorly and medially from the first injection site.  -Cervical paraspinal muscle injection, 2 sites, bilateral knee first injection site was 1 cm from the midline of the cervical spine, 3 cm inferior to the lower border of the occipital protuberance. The second injection site was 1.5 cm superiorly and laterally to the first injection site.  -Trapezius muscle injection was performed at 3 sites, bilaterally. The first injection site was in the upper trapezius muscle halfway between the inflection point of the neck, and the acromion. The second injection site was one half way between the acromion and the first injection site. The third injection was done between the first injection site and the inflection point of the neck.   Will return for repeat injection in 3 months.   200 units of Botox was used, any Botox not injected was wasted. The patient tolerated the procedure well, there were no complications of the above procedure.

## 2020-08-12 NOTE — Telephone Encounter (Signed)
Received (2) 100 unit vials of Botox today from [facility].

## 2020-08-13 DIAGNOSIS — Z792 Long term (current) use of antibiotics: Secondary | ICD-10-CM | POA: Diagnosis not present

## 2020-08-13 DIAGNOSIS — Z09 Encounter for follow-up examination after completed treatment for conditions other than malignant neoplasm: Secondary | ICD-10-CM | POA: Diagnosis not present

## 2020-08-13 DIAGNOSIS — Z888 Allergy status to other drugs, medicaments and biological substances status: Secondary | ICD-10-CM | POA: Diagnosis not present

## 2020-08-13 DIAGNOSIS — M25561 Pain in right knee: Secondary | ICD-10-CM | POA: Diagnosis not present

## 2020-08-13 DIAGNOSIS — Y831 Surgical operation with implant of artificial internal device as the cause of abnormal reaction of the patient, or of later complication, without mention of misadventure at the time of the procedure: Secondary | ICD-10-CM | POA: Diagnosis not present

## 2020-08-13 DIAGNOSIS — Z881 Allergy status to other antibiotic agents status: Secondary | ICD-10-CM | POA: Diagnosis not present

## 2020-08-13 DIAGNOSIS — Z885 Allergy status to narcotic agent status: Secondary | ICD-10-CM | POA: Diagnosis not present

## 2020-08-13 DIAGNOSIS — T8453XD Infection and inflammatory reaction due to internal right knee prosthesis, subsequent encounter: Secondary | ICD-10-CM | POA: Diagnosis not present

## 2020-08-14 DIAGNOSIS — E039 Hypothyroidism, unspecified: Secondary | ICD-10-CM | POA: Diagnosis not present

## 2020-08-14 DIAGNOSIS — J449 Chronic obstructive pulmonary disease, unspecified: Secondary | ICD-10-CM | POA: Diagnosis not present

## 2020-08-14 DIAGNOSIS — G9050 Complex regional pain syndrome I, unspecified: Secondary | ICD-10-CM | POA: Diagnosis not present

## 2020-08-14 DIAGNOSIS — M26609 Unspecified temporomandibular joint disorder, unspecified side: Secondary | ICD-10-CM | POA: Diagnosis not present

## 2020-08-14 DIAGNOSIS — G43909 Migraine, unspecified, not intractable, without status migrainosus: Secondary | ICD-10-CM | POA: Diagnosis not present

## 2020-08-14 DIAGNOSIS — E785 Hyperlipidemia, unspecified: Secondary | ICD-10-CM | POA: Diagnosis not present

## 2020-08-14 DIAGNOSIS — T8453XA Infection and inflammatory reaction due to internal right knee prosthesis, initial encounter: Secondary | ICD-10-CM | POA: Diagnosis not present

## 2020-08-14 DIAGNOSIS — M879 Osteonecrosis, unspecified: Secondary | ICD-10-CM | POA: Diagnosis not present

## 2020-08-14 DIAGNOSIS — M1990 Unspecified osteoarthritis, unspecified site: Secondary | ICD-10-CM | POA: Diagnosis not present

## 2020-08-14 DIAGNOSIS — I10 Essential (primary) hypertension: Secondary | ICD-10-CM | POA: Diagnosis not present

## 2020-08-14 DIAGNOSIS — Z9181 History of falling: Secondary | ICD-10-CM | POA: Diagnosis not present

## 2020-08-14 DIAGNOSIS — F411 Generalized anxiety disorder: Secondary | ICD-10-CM | POA: Diagnosis not present

## 2020-08-14 DIAGNOSIS — M797 Fibromyalgia: Secondary | ICD-10-CM | POA: Diagnosis not present

## 2020-08-16 DIAGNOSIS — T8453XA Infection and inflammatory reaction due to internal right knee prosthesis, initial encounter: Secondary | ICD-10-CM | POA: Diagnosis not present

## 2020-08-21 DIAGNOSIS — T8453XA Infection and inflammatory reaction due to internal right knee prosthesis, initial encounter: Secondary | ICD-10-CM | POA: Diagnosis not present

## 2020-08-21 DIAGNOSIS — Y792 Prosthetic and other implants, materials and accessory orthopedic devices associated with adverse incidents: Secondary | ICD-10-CM | POA: Diagnosis not present

## 2020-08-23 DIAGNOSIS — T8453XA Infection and inflammatory reaction due to internal right knee prosthesis, initial encounter: Secondary | ICD-10-CM | POA: Diagnosis not present

## 2020-08-28 DIAGNOSIS — M26623 Arthralgia of bilateral temporomandibular joint: Secondary | ICD-10-CM | POA: Diagnosis not present

## 2020-08-28 DIAGNOSIS — M26633 Articular disc disorder of bilateral temporomandibular joint: Secondary | ICD-10-CM | POA: Diagnosis not present

## 2020-08-28 DIAGNOSIS — Y831 Surgical operation with implant of artificial internal device as the cause of abnormal reaction of the patient, or of later complication, without mention of misadventure at the time of the procedure: Secondary | ICD-10-CM | POA: Diagnosis not present

## 2020-08-28 DIAGNOSIS — X58XXXA Exposure to other specified factors, initial encounter: Secondary | ICD-10-CM | POA: Diagnosis not present

## 2020-08-28 DIAGNOSIS — T8453XA Infection and inflammatory reaction due to internal right knee prosthesis, initial encounter: Secondary | ICD-10-CM | POA: Diagnosis not present

## 2020-08-28 DIAGNOSIS — M2652 Limited mandibular range of motion: Secondary | ICD-10-CM | POA: Diagnosis not present

## 2020-08-30 DIAGNOSIS — M26633 Articular disc disorder of bilateral temporomandibular joint: Secondary | ICD-10-CM | POA: Diagnosis not present

## 2020-08-30 DIAGNOSIS — T8453XA Infection and inflammatory reaction due to internal right knee prosthesis, initial encounter: Secondary | ICD-10-CM | POA: Diagnosis not present

## 2020-08-30 DIAGNOSIS — M2652 Limited mandibular range of motion: Secondary | ICD-10-CM | POA: Diagnosis not present

## 2020-08-30 DIAGNOSIS — M26623 Arthralgia of bilateral temporomandibular joint: Secondary | ICD-10-CM | POA: Diagnosis not present

## 2020-09-02 DIAGNOSIS — M26623 Arthralgia of bilateral temporomandibular joint: Secondary | ICD-10-CM | POA: Diagnosis not present

## 2020-09-02 DIAGNOSIS — M2652 Limited mandibular range of motion: Secondary | ICD-10-CM | POA: Diagnosis not present

## 2020-09-02 DIAGNOSIS — M26633 Articular disc disorder of bilateral temporomandibular joint: Secondary | ICD-10-CM | POA: Diagnosis not present

## 2020-09-05 ENCOUNTER — Inpatient Hospital Stay (HOSPITAL_COMMUNITY): Admission: RE | Admit: 2020-09-05 | Payer: BC Managed Care – PPO | Source: Ambulatory Visit

## 2020-09-05 DIAGNOSIS — Y831 Surgical operation with implant of artificial internal device as the cause of abnormal reaction of the patient, or of later complication, without mention of misadventure at the time of the procedure: Secondary | ICD-10-CM | POA: Diagnosis not present

## 2020-09-05 DIAGNOSIS — T8453XA Infection and inflammatory reaction due to internal right knee prosthesis, initial encounter: Secondary | ICD-10-CM | POA: Diagnosis not present

## 2020-09-06 DIAGNOSIS — T8453XA Infection and inflammatory reaction due to internal right knee prosthesis, initial encounter: Secondary | ICD-10-CM | POA: Diagnosis not present

## 2020-09-09 DIAGNOSIS — T8453XA Infection and inflammatory reaction due to internal right knee prosthesis, initial encounter: Secondary | ICD-10-CM | POA: Diagnosis not present

## 2020-09-11 DIAGNOSIS — T8453XA Infection and inflammatory reaction due to internal right knee prosthesis, initial encounter: Secondary | ICD-10-CM | POA: Diagnosis not present

## 2020-09-11 DIAGNOSIS — X58XXXA Exposure to other specified factors, initial encounter: Secondary | ICD-10-CM | POA: Diagnosis not present

## 2020-09-13 DIAGNOSIS — M8700 Idiopathic aseptic necrosis of unspecified bone: Secondary | ICD-10-CM | POA: Diagnosis not present

## 2020-09-13 DIAGNOSIS — M129 Arthropathy, unspecified: Secondary | ICD-10-CM | POA: Diagnosis not present

## 2020-09-13 DIAGNOSIS — T8453XA Infection and inflammatory reaction due to internal right knee prosthesis, initial encounter: Secondary | ICD-10-CM | POA: Diagnosis not present

## 2020-09-13 DIAGNOSIS — Z79899 Other long term (current) drug therapy: Secondary | ICD-10-CM | POA: Diagnosis not present

## 2020-09-13 DIAGNOSIS — M797 Fibromyalgia: Secondary | ICD-10-CM | POA: Diagnosis not present

## 2020-09-13 DIAGNOSIS — M879 Osteonecrosis, unspecified: Secondary | ICD-10-CM | POA: Diagnosis not present

## 2020-09-15 DIAGNOSIS — M129 Arthropathy, unspecified: Secondary | ICD-10-CM | POA: Diagnosis not present

## 2020-09-15 DIAGNOSIS — M879 Osteonecrosis, unspecified: Secondary | ICD-10-CM | POA: Diagnosis not present

## 2020-09-15 DIAGNOSIS — M797 Fibromyalgia: Secondary | ICD-10-CM | POA: Diagnosis not present

## 2020-09-15 DIAGNOSIS — M8700 Idiopathic aseptic necrosis of unspecified bone: Secondary | ICD-10-CM | POA: Diagnosis not present

## 2020-09-15 DIAGNOSIS — Z79899 Other long term (current) drug therapy: Secondary | ICD-10-CM | POA: Diagnosis not present

## 2020-09-17 DIAGNOSIS — Z96651 Presence of right artificial knee joint: Secondary | ICD-10-CM | POA: Diagnosis not present

## 2020-09-17 DIAGNOSIS — M25461 Effusion, right knee: Secondary | ICD-10-CM | POA: Diagnosis not present

## 2020-09-17 DIAGNOSIS — R42 Dizziness and giddiness: Secondary | ICD-10-CM | POA: Diagnosis not present

## 2020-09-17 DIAGNOSIS — T8453XD Infection and inflammatory reaction due to internal right knee prosthesis, subsequent encounter: Secondary | ICD-10-CM | POA: Diagnosis not present

## 2020-09-17 DIAGNOSIS — Y831 Surgical operation with implant of artificial internal device as the cause of abnormal reaction of the patient, or of later complication, without mention of misadventure at the time of the procedure: Secondary | ICD-10-CM | POA: Diagnosis not present

## 2020-09-17 DIAGNOSIS — M7731 Calcaneal spur, right foot: Secondary | ICD-10-CM | POA: Diagnosis not present

## 2020-09-17 DIAGNOSIS — Z471 Aftercare following joint replacement surgery: Secondary | ICD-10-CM | POA: Diagnosis not present

## 2020-09-17 DIAGNOSIS — T8450XS Infection and inflammatory reaction due to unspecified internal joint prosthesis, sequela: Secondary | ICD-10-CM | POA: Diagnosis not present

## 2020-09-17 DIAGNOSIS — Z889 Allergy status to unspecified drugs, medicaments and biological substances status: Secondary | ICD-10-CM | POA: Diagnosis not present

## 2020-09-17 DIAGNOSIS — Z792 Long term (current) use of antibiotics: Secondary | ICD-10-CM | POA: Diagnosis not present

## 2020-09-17 DIAGNOSIS — R110 Nausea: Secondary | ICD-10-CM | POA: Diagnosis not present

## 2020-09-17 DIAGNOSIS — Z09 Encounter for follow-up examination after completed treatment for conditions other than malignant neoplasm: Secondary | ICD-10-CM | POA: Diagnosis not present

## 2020-09-18 DIAGNOSIS — Z79899 Other long term (current) drug therapy: Secondary | ICD-10-CM | POA: Diagnosis not present

## 2020-09-24 ENCOUNTER — Other Ambulatory Visit: Payer: Self-pay | Admitting: Neurology

## 2020-09-24 DIAGNOSIS — G8929 Other chronic pain: Secondary | ICD-10-CM | POA: Diagnosis not present

## 2020-09-24 DIAGNOSIS — M797 Fibromyalgia: Secondary | ICD-10-CM | POA: Diagnosis not present

## 2020-09-24 DIAGNOSIS — M8700 Idiopathic aseptic necrosis of unspecified bone: Secondary | ICD-10-CM | POA: Diagnosis not present

## 2020-09-24 DIAGNOSIS — M25561 Pain in right knee: Secondary | ICD-10-CM | POA: Diagnosis not present

## 2020-09-24 DIAGNOSIS — S345XXS Injury of lumbar, sacral and pelvic sympathetic nerves, sequela: Secondary | ICD-10-CM | POA: Diagnosis not present

## 2020-09-24 DIAGNOSIS — G43711 Chronic migraine without aura, intractable, with status migrainosus: Secondary | ICD-10-CM

## 2020-09-24 DIAGNOSIS — Z79899 Other long term (current) drug therapy: Secondary | ICD-10-CM | POA: Diagnosis not present

## 2020-10-15 DIAGNOSIS — R42 Dizziness and giddiness: Secondary | ICD-10-CM | POA: Diagnosis not present

## 2020-10-15 DIAGNOSIS — T8453XD Infection and inflammatory reaction due to internal right knee prosthesis, subsequent encounter: Secondary | ICD-10-CM | POA: Diagnosis not present

## 2020-10-15 DIAGNOSIS — Y838 Other surgical procedures as the cause of abnormal reaction of the patient, or of later complication, without mention of misadventure at the time of the procedure: Secondary | ICD-10-CM | POA: Diagnosis not present

## 2020-10-15 DIAGNOSIS — R112 Nausea with vomiting, unspecified: Secondary | ICD-10-CM | POA: Diagnosis not present

## 2020-10-15 DIAGNOSIS — Z79899 Other long term (current) drug therapy: Secondary | ICD-10-CM | POA: Diagnosis not present

## 2020-10-15 DIAGNOSIS — Z792 Long term (current) use of antibiotics: Secondary | ICD-10-CM | POA: Diagnosis not present

## 2020-10-15 DIAGNOSIS — Z889 Allergy status to unspecified drugs, medicaments and biological substances status: Secondary | ICD-10-CM | POA: Diagnosis not present

## 2020-10-15 DIAGNOSIS — Z09 Encounter for follow-up examination after completed treatment for conditions other than malignant neoplasm: Secondary | ICD-10-CM | POA: Diagnosis not present

## 2020-10-23 DIAGNOSIS — G43109 Migraine with aura, not intractable, without status migrainosus: Secondary | ICD-10-CM | POA: Diagnosis not present

## 2020-10-23 DIAGNOSIS — B001 Herpesviral vesicular dermatitis: Secondary | ICD-10-CM | POA: Diagnosis not present

## 2020-10-23 DIAGNOSIS — F5101 Primary insomnia: Secondary | ICD-10-CM | POA: Diagnosis not present

## 2020-10-23 DIAGNOSIS — Z0000 Encounter for general adult medical examination without abnormal findings: Secondary | ICD-10-CM | POA: Diagnosis not present

## 2020-10-29 DIAGNOSIS — M879 Osteonecrosis, unspecified: Secondary | ICD-10-CM | POA: Diagnosis not present

## 2020-10-29 DIAGNOSIS — Z79899 Other long term (current) drug therapy: Secondary | ICD-10-CM | POA: Diagnosis not present

## 2020-10-29 DIAGNOSIS — M8700 Idiopathic aseptic necrosis of unspecified bone: Secondary | ICD-10-CM | POA: Diagnosis not present

## 2020-10-29 DIAGNOSIS — Z6822 Body mass index (BMI) 22.0-22.9, adult: Secondary | ICD-10-CM | POA: Diagnosis not present

## 2020-10-29 DIAGNOSIS — F411 Generalized anxiety disorder: Secondary | ICD-10-CM | POA: Diagnosis not present

## 2020-10-29 DIAGNOSIS — S345XXS Injury of lumbar, sacral and pelvic sympathetic nerves, sequela: Secondary | ICD-10-CM | POA: Diagnosis not present

## 2020-10-31 DIAGNOSIS — Z79899 Other long term (current) drug therapy: Secondary | ICD-10-CM | POA: Diagnosis not present

## 2020-11-07 DIAGNOSIS — L509 Urticaria, unspecified: Secondary | ICD-10-CM | POA: Diagnosis not present

## 2020-11-17 ENCOUNTER — Telehealth: Payer: Self-pay | Admitting: Neurology

## 2020-11-17 ENCOUNTER — Encounter: Payer: Self-pay | Admitting: Neurology

## 2020-11-17 ENCOUNTER — Ambulatory Visit (INDEPENDENT_AMBULATORY_CARE_PROVIDER_SITE_OTHER): Payer: BC Managed Care – PPO | Admitting: Neurology

## 2020-11-17 VITALS — Ht 64.0 in

## 2020-11-17 DIAGNOSIS — G43709 Chronic migraine without aura, not intractable, without status migrainosus: Secondary | ICD-10-CM | POA: Diagnosis not present

## 2020-11-17 MED ORDER — KETOROLAC TROMETHAMINE 60 MG/2ML IM SOLN: INTRAMUSCULAR | 0 refills | Status: DC

## 2020-11-17 NOTE — Progress Notes (Signed)
Botox- 100 units x 2 vials Lot: [id number] Expiration: 07/2022 NDC: 3785-8850-27  Bacteriostatic 0.9% Sodium Chloride- 69mL total Lot: [id number] Expiration: 02/22/2022 NDC: 8676-7209-47  Dx: S96.283 S/PAccredo

## 2020-11-17 NOTE — Telephone Encounter (Signed)
Patient has a Botox appointment today with Dr. Jaynee Eagles. I called [employer] [place] @ [phone] to check if [place] is required for [id number] and [phone]. PA is not required. Reference #[id number].

## 2020-11-17 NOTE — Progress Notes (Signed)
Consent Form Botulism Toxin Injection For Chronic Migraine  11/17/2020: Doing great > 60% improvement in frequency and severity of migraines  Reviewed orally with patient, additionally signature is on file:  Botulism toxin has been approved by the Federal drug administration for treatment of chronic migraine. Botulism toxin does not cure chronic migraine and it may not be effective in some patients.  The administration of botulism toxin is accomplished by injecting a small amount of toxin into the muscles of the neck and head. Dosage must be titrated for each individual. Any benefits resulting from botulism toxin tend to wear off after 3 months with a repeat injection required if benefit is to be maintained. Injections are usually done every 3-4 months with maximum effect peak achieved by about 2 or 3 weeks. Botulism toxin is expensive and you should be sure of what costs you will incur resulting from the injection.  The side effects of botulism toxin use for chronic migraine may include:   -Transient, and usually mild, facial weakness with facial injections  -Transient, and usually mild, head or neck weakness with head/neck injections  -Reduction or loss of forehead facial animation due to forehead muscle weakness  -Eyelid drooping  -Dry eye  -Pain at the site of injection or bruising at the site of injection  -Double vision  -Potential unknown long term risks  Contraindications: You should not have Botox if you are pregnant, nursing, allergic to albumin, have an infection, skin condition, or muscle weakness at the site of the injection, or have myasthenia gravis, Lambert-Eaton syndrome, or ALS.  It is also possible that as with any injection, there may be an allergic reaction or no effect from the medication. Reduced effectiveness after repeated injections is sometimes seen and rarely infection at the injection site may occur. All care will be taken to prevent these side effects. If therapy  is given over a long time, atrophy and wasting in the muscle injected may occur. Occasionally the patient's become refractory to treatment because they develop antibodies to the toxin. In this event, therapy needs to be modified.  I have read the above information and consent to the administration of botulism toxin.    BOTOX PROCEDURE NOTE FOR MIGRAINE HEADACHE    Contraindications and precautions discussed with patient(above). Aseptic procedure was observed and patient tolerated procedure. Procedure performed by Dr. Georgia Dom  The condition has existed for more than 6 months, and pt does not have a diagnosis of ALS, Myasthenia Gravis or Lambert-Eaton Syndrome.  Risks and benefits of injections discussed and pt agrees to proceed with the procedure.  Written consent obtained  These injections are medically necessary. Pt  receives good benefits from these injections. These injections do not cause sedations or hallucinations which the oral therapies may cause.  Description of procedure:  The patient was placed in a sitting position. The standard protocol was used for Botox as follows, with 5 units of Botox injected at each site:   -Procerus muscle, midline injection  -Corrugator muscle, bilateral injection  -Frontalis muscle, bilateral injection, with 2 sites each side, medial injection was performed in the upper one third of the frontalis muscle, in the region vertical from the medial inferior edge of the superior orbital rim. The lateral injection was again in the upper one third of the forehead vertically above the lateral limbus of the cornea, 1.5 cm lateral to the medial injection site.  -Temporalis muscle injection, 4 sites, bilaterally. The first injection was 3 cm above the tragus  of the ear, second injection site was 1.5 cm to 3 cm up from the first injection site in line with the tragus of the ear. The third injection site was 1.5-3 cm forward between the first 2 injection sites.  The fourth injection site was 1.5 cm posterior to the second injection site.   -Occipitalis muscle injection, 3 sites, bilaterally. The first injection was done one half way between the occipital protuberance and the tip of the mastoid process behind the ear. The second injection site was done lateral and superior to the first, 1 fingerbreadth from the first injection. The third injection site was 1 fingerbreadth superiorly and medially from the first injection site.  -Cervical paraspinal muscle injection, 2 sites, bilateral knee first injection site was 1 cm from the midline of the cervical spine, 3 cm inferior to the lower border of the occipital protuberance. The second injection site was 1.5 cm superiorly and laterally to the first injection site.  -Trapezius muscle injection was performed at 3 sites, bilaterally. The first injection site was in the upper trapezius muscle halfway between the inflection point of the neck, and the acromion. The second injection site was one half way between the acromion and the first injection site. The third injection was done between the first injection site and the inflection point of the neck.   Will return for repeat injection in 3 months.   155 units of Botox was used, 45u Botox not injected was wasted. The patient tolerated the procedure well, there were no complications of the above procedure.

## 2020-11-18 ENCOUNTER — Emergency Department (HOSPITAL_BASED_OUTPATIENT_CLINIC_OR_DEPARTMENT_OTHER)
Admission: EM | Admit: 2020-11-18 | Discharge: 2020-11-18 | Disposition: A | Discharge status: Home / Self Care | Payer: BC Managed Care – PPO | Attending: Emergency Medicine | Admitting: Emergency Medicine

## 2020-11-18 ENCOUNTER — Ambulatory Visit: Payer: Medicare Other | Admitting: Neurology

## 2020-11-18 ENCOUNTER — Other Ambulatory Visit: Payer: Self-pay

## 2020-11-18 ENCOUNTER — Emergency Department (HOSPITAL_BASED_OUTPATIENT_CLINIC_OR_DEPARTMENT_OTHER): Payer: BC Managed Care – PPO

## 2020-11-18 ENCOUNTER — Encounter (HOSPITAL_BASED_OUTPATIENT_CLINIC_OR_DEPARTMENT_OTHER): Payer: Self-pay | Admitting: Emergency Medicine

## 2020-11-18 DIAGNOSIS — Z85820 Personal history of malignant melanoma of skin: Secondary | ICD-10-CM | POA: Diagnosis not present

## 2020-11-18 DIAGNOSIS — E039 Hypothyroidism, unspecified: Secondary | ICD-10-CM | POA: Insufficient documentation

## 2020-11-18 DIAGNOSIS — Z79899 Other long term (current) drug therapy: Secondary | ICD-10-CM | POA: Insufficient documentation

## 2020-11-18 DIAGNOSIS — R110 Nausea: Secondary | ICD-10-CM | POA: Insufficient documentation

## 2020-11-18 DIAGNOSIS — Z87891 Personal history of nicotine dependence: Secondary | ICD-10-CM | POA: Diagnosis not present

## 2020-11-18 DIAGNOSIS — R0789 Other chest pain: Secondary | ICD-10-CM | POA: Insufficient documentation

## 2020-11-18 DIAGNOSIS — Z91040 Latex allergy status: Secondary | ICD-10-CM | POA: Diagnosis not present

## 2020-11-18 DIAGNOSIS — J45909 Unspecified asthma, uncomplicated: Secondary | ICD-10-CM | POA: Diagnosis not present

## 2020-11-18 DIAGNOSIS — Z96651 Presence of right artificial knee joint: Secondary | ICD-10-CM | POA: Insufficient documentation

## 2020-11-18 DIAGNOSIS — I4891 Unspecified atrial fibrillation: Secondary | ICD-10-CM | POA: Diagnosis not present

## 2020-11-18 DIAGNOSIS — Z91010 Allergy to peanuts: Secondary | ICD-10-CM | POA: Insufficient documentation

## 2020-11-18 DIAGNOSIS — E876 Hypokalemia: Secondary | ICD-10-CM | POA: Diagnosis not present

## 2020-11-18 DIAGNOSIS — R5383 Other fatigue: Secondary | ICD-10-CM | POA: Diagnosis not present

## 2020-11-18 DIAGNOSIS — R000 Tachycardia, unspecified: Secondary | ICD-10-CM | POA: Diagnosis not present

## 2020-11-18 DIAGNOSIS — I10 Essential (primary) hypertension: Secondary | ICD-10-CM | POA: Insufficient documentation

## 2020-11-18 LAB — CBC WITH DIFFERENTIAL/PLATELET
Abs Immature Granulocytes: 0.01 10*3/uL (ref 0.00–0.07)
Basophils Absolute: 0 10*3/uL (ref 0.0–0.1)
Basophils Relative: 1 %
Eosinophils Absolute: 0.3 10*3/uL (ref 0.0–0.5)
Eosinophils Relative: 4 %
HCT: 42.4 % (ref 36.0–46.0)
Hemoglobin: 14.4 g/dL (ref 12.0–15.0)
Immature Granulocytes: 0 %
Lymphocytes Relative: 41 %
Lymphs Abs: 3.4 10*3/uL (ref 0.7–4.0)
MCH: 28.8 pg (ref 26.0–34.0)
MCHC: 34 g/dL (ref 30.0–36.0)
MCV: 84.8 fL (ref 80.0–100.0)
Monocytes Absolute: 0.6 10*3/uL (ref 0.1–1.0)
Monocytes Relative: 7 %
Neutro Abs: 3.8 10*3/uL (ref 1.7–7.7)
Neutrophils Relative %: 47 %
Platelets: 315 10*3/uL (ref 150–400)
RBC: 5 MIL/uL (ref 3.87–5.11)
RDW: 14 % (ref 11.5–15.5)
WBC: 8.2 10*3/uL (ref 4.0–10.5)
nRBC: 0 % (ref 0.0–0.2)

## 2020-11-18 LAB — COMPREHENSIVE METABOLIC PANEL
ALT: 12 U/L (ref 0–44)
AST: 24 U/L (ref 15–41)
Albumin: 4.1 g/dL (ref 3.5–5.0)
Alkaline Phosphatase: 82 U/L (ref 38–126)
Anion gap: 13 (ref 5–15)
BUN: 23 mg/dL — ABNORMAL HIGH (ref 6–20)
CO2: 23 mmol/L (ref 22–32)
Calcium: 9.4 mg/dL (ref 8.9–10.3)
Chloride: 101 mmol/L (ref 98–111)
Creatinine, Ser: 1.23 mg/dL — ABNORMAL HIGH (ref 0.44–1.00)
GFR, Estimated: 52 mL/min — ABNORMAL LOW (ref >60)
Glucose, Bld: 130 mg/dL — ABNORMAL HIGH (ref 70–99)
Potassium: 3.2 mmol/L — ABNORMAL LOW (ref 3.5–5.1)
Sodium: 137 mmol/L (ref 135–145)
Total Bilirubin: 0.5 mg/dL (ref 0.3–1.2)
Total Protein: 7.4 g/dL (ref 6.5–8.1)

## 2020-11-18 LAB — MAGNESIUM: Magnesium: 1.9 mg/dL (ref 1.7–2.4)

## 2020-11-18 LAB — TROPONIN I (HIGH SENSITIVITY): Troponin I (High Sensitivity): <2 ng/L (ref <18)

## 2020-11-18 MED ORDER — PROCHLORPERAZINE MALEATE 10 MG PO TABS
5.0000 mg | ORAL_TABLET | Freq: Once | ORAL | Status: AC
  Administered 2020-11-18: 5 mg via ORAL
  Filled 2020-11-18: qty 1

## 2020-11-18 MED ORDER — SODIUM CHLORIDE 0.9 % IV BOLUS: 1000.0000 mL | Freq: Once | INTRAVENOUS | Status: DC

## 2020-11-18 NOTE — ED Notes (Signed)
Unsuccessful phlebotomy attempt RAC

## 2020-11-18 NOTE — ED Notes (Signed)
Pt discharged to home. Discharge instructions have been discussed with patient and/or family members. Pt verbally acknowledges understanding d/c instructions, and endorses comprehension to checkout at registration before leaving.  °

## 2020-11-18 NOTE — Discharge Instructions (Addendum)
Please ensure that you are taking your potassium supplements and that you are hydrating well.  Call your primary doctor for follow up.

## 2020-11-18 NOTE — ED Provider Notes (Signed)
[facility] EMERGENCY DEPARTMENT Provider Note  CSN: [id number] Arrival date & time: 11/18/20 0421  Chief Complaint(s) Atrial Fibrillation  HPI Kari Green is a 55 y.o. female with Past medical history listed below who presents to the emergency department with 1 week of generalized fatigue. Patient also noted that she was in A. fib but her rate was controlled.  She has a history of paroxysmal A. fib.  She called cardiology and recommended she monitor since she was rate controlled.  Tonight she felt her heart rate increase and felt chest pressure.  Pain is nonradiating.  She does feel tourniquet like discomfort in bilateral wrist.   Patient also reports feeling like her eyes are twitching and felt like the muscles in her face spasmed.  The facial issues have resolved.  She denies any focal deficits. No associated shortness of breath.  She does endorse some nausea without emesis.  No abdominal pain.  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  No other recent fevers or infections.  No coughing or congestion.  Since being on antibiotics patient does report and diarrhea. Reports that she tries to hydrate.    Atrial Fibrillation   Past Medical History Past Medical History:  Diagnosis Date   Anemia    Slight   Anxiety    Arthritis    right thumb   Asthma    Avascular necrosis (HCC)    left shoulder, right knee, jaws/mouth that she knows of at this time   Basal cell carcinoma    Chronic migraine without aura, intractable, with status migrainosus    neurologist-  dr Jaynee Eagles--  receives botox injections every 3 months   Chronic, continuous use of opioids    Complication of anesthesia    "not asleep and aware of everything"   Degenerative disc disease, cervical    Degenerative disc disease, lumbar    Delayed gastric emptying    Difficult intravenous access    Now has Powerport for access   Difficulty swallowing pills     Enlarged lymph node in neck    left   Esophageal dysmotility    Family history of adverse reaction to anesthesia    family - itching, hives, PONV   Family history of breast cancer 08/29/2017   Family history of leukemia 08/29/2017   Family history of ovarian cancer 08/29/2017   Family history of prostate cancer 08/29/2017   Family history of thyroid cancer 08/29/2017   Fatty liver    PMH   Fibromyalgia    H/O blood transfusion reaction    x 3; last one caused hives an d cellulitis   Hemangioma of liver    Hemangioma, renal    Hiatal hernia    History of exercise stress test 05/08/2015   negative stress test without evidence of ischemia at given workload   History of kidney stones 04/2014   History of skin cancer    excision melanoma - scalp;  excision behind knee   Hypertension    IC (interstitial cystitis)    urologist-  dr Louis Meckel   Irritable bowel syndrome (IBS)    Knee pain, right    states gets "nerves burned" periodically   Limited joint range of motion    right knee - unable to fully extend right leg   Melanoma ([place])    Memory impairment    due to pain    Osteoporosis    Pain syndrome, chronic    Pneumonia  history of X2   PONV (postoperative nausea and vomiting)    uncontrollable itching with anesthesia; hx. of anesthesia awareness; pt request needs 50  mg of Benadryl IV/phenergan 50 mg IV   Port-A-Cath in place 11/04/2016   Post-surgical hypothyroidism    Sciatica    bilateral   Seasonal allergies    Sepsis ([place])    R knee Prosthesis 2022   Squamous carcinoma    Patient Active Problem List   Diagnosis Date Noted   Chronic migraine without aura without status migrainosus, not intractable 08/12/2020   Sympathetic pain 06/03/2020   History of allergy to chlorhexidine (ChloraPrep) 06/03/2020    Class: History of   Chronic abdominal pain 05/12/2020   Chronic pain due to injury 05/12/2020   History of multiple allergies 05/12/2020   Chronic sacroiliac  joint pain (Left) 05/12/2020   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) 05/12/2020   Dysphagia 04/08/2020   Imaging of gastrointestinal tract abnormal 04/08/2020   Epigastric pain 04/08/2020   Screening for malignant neoplasm of colon 04/08/2020   Condyloma acuminatum in female 03/18/2020   History of postoperative nausea and vomiting 03/18/2020   Acute insomnia 03/05/2020   Primary insomnia 03/05/2020   Atrial fibrillation ([place]) 03/05/2020   Burning sensation of skin 03/05/2020   Controlled drug dependence ([place]) 03/05/2020   Essential hypertension 03/05/2020   Hardening of the aorta (main artery of the heart) ([place]) 03/05/2020   Hyperlipidemia 03/05/2020   Shoulder joint pain 03/05/2020   Major depression in remission ([place]) 03/05/2020   Microscopic colitis 03/05/2020   Vitamin B12 deficiency (non anemic) 03/05/2020   Chronic pancreatitis ([place]) 03/05/2020   Other long term (current) drug therapy 03/05/2020   Chronic hip pain (Bilateral) 03/05/2020   Somatic dysfunction of sacroiliac joints (Bilateral) 03/05/2020   Chronic sacroiliac pain (Bilateral) 03/05/2020   Lumbar facet joint syndrome (Bilateral) 03/05/2020   Nasal valve collapse 12/19/2018   Acquired nasal deformity 12/19/2018   Tachycardia 08/28/2018   Hypothyroidism associated with surgical procedure 12/05/2017   History of allergy to latex 10/25/2017   Acute postoperative pain 10/25/2017   Cancer ([place]) 10/06/2017   Gross hematuria 10/06/2017   History of kidney stones 10/06/2017   Interstitial cystitis (chronic) with hematuria 10/06/2017   Right flank pain 10/06/2017   Genetic testing 09/21/2017   Family history of breast cancer 08/29/2017   Family history of leukemia 08/29/2017   Family history of thyroid cancer 08/29/2017   Family history of ovarian cancer 08/29/2017   Family history of prostate cancer 08/29/2017   History of severe nausea following anesthesia 07/05/2017    Class: History of   Port-A-Cath  in place 04/26/2017   Avascular necrosis of bone ([place]) 02/16/2017   Burn injury 01/03/2017   Tobacco user 12/27/2016   History of bisphosphonate therapy 12/27/2016   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   Medication monitoring encounter 12/27/2016   History of revision of total knee replacement (Right) 12/27/2016   Migraine without aura, not refractory 07/25/2016   Venereal wart 07/21/2016   Vaginal mass 07/21/2016   Chronic knee pain after total replacement of knee joint (Right) 07/21/2016   Nasal ulcer 07/05/2016   S/P shoulder hemiarthroplasty (Left) 12/04/2015   Hemoptysis 10/16/2015   Cough 10/16/2015   Radiculopathy 07/16/2015   Cervical lymphadenopathy 05/29/2015   Muscle cramps at night 05/06/2015   Temporomandibular joint disorder 04/10/2015   Buzzing in ear 04/10/2015   Ear pain, referred 04/10/2015   Idiopathic osteoporosis 04/10/2015  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/10/2015   History of migraine headaches 04/10/2015   Difficulty hearing 04/10/2015   H/O respiratory system disease 04/10/2015   Fibromyalgia 04/10/2015   Deflected nasal septum 04/10/2015   Chronic otitis externa 04/10/2015   Abnormal auditory perception 04/10/2015   Hearing loss 04/10/2015   Asthma 03/25/2015   History of allergy to IVP dye 01/31/2015   Chronic low back pain (Bilateral) w/o sciatica 01/13/2015   CRPS (complex regional pain syndrome) type I of lower limb (Right) 01/13/2015   History of arthroplasty of knee (Right) 01/13/2015   Chronic pain syndrome 01/13/2015   Chronic shoulder pain (Left) 01/13/2015   Chronic knee pain (2ry area of Pain) (Right) 01/13/2015   Anxiety disorder 01/13/2015   Chronic lower extremity pain (1ry area of Pain) (Right) 01/13/2015   Multiple drug allergies (codeine, Lidoderm a DC if, sulfa, latex, IVP dye, Keflex, Augmentin) 01/13/2015   Latex allergy 01/13/2015   Long term current use of opiate analgesic 01/13/2015    Long term prescription opiate use 01/13/2015   Opiate use 01/13/2015   Chronic, continuous use of opioids 01/13/2015   Moderate persistent asthma 11/15/2014   Allergic rhinitis 11/15/2014   Memory deficits 10/24/2013   Failed total knee, left ([place]) 12/08/2012   Chest pain 09/21/2012   Chronic obstructive pulmonary disease, unspecified ([place]) 09/21/2012   Hypothyroidism 09/21/2012   Abdominal adhesions 02/11/2012   Abdominal pain, epigastric 12/15/2011   Irritable bowel syndrome with diarrhea 02/26/2011   Home Medication(s) Prior to Admission medications   Medication Sig Start Date End Date Taking? Authorizing Provider  ALPRAZolam (XANAX) 0.25 MG tablet Take 0.5 mg by mouth 3 (three) times daily as needed. 02/13/20   [provider]  atorvastatin (LIPITOR) 40 MG tablet Take 40 mg by mouth daily. 03/12/20   [provider]  BOTOX 100 units SOLR injection INJECT 155 UNITS INTO THE MUSCLES OF THE HEAD AND NECK EVERY 3 MONTHS, BY PRESCRIBER IN OFFICE FOR ADMINSTRATION .DISCARD UNUSED PORTION 09/24/20   Melvenia Beam, MD  cefdinir (OMNICEF) 300 MG capsule Take 300 mg by mouth 2 (two) times daily. 10/16/20   [provider]  cyanocobalamin (,VITAMIN B-12,) 1000 MCG/ML injection Inject 1,000 mcg into the muscle every Tuesday. Reported on 03/25/2015    [provider]  diclofenac sodium (VOLTAREN) 1 % GEL Apply 1 application topically 4 (four) times daily as needed for pain. 07/12/18   [provider]  diphenhydrAMINE (BENADRYL) 50 MG tablet Take 50 mg by mouth every 4 (four) hours as needed for itching.     [provider]  doxycycline (VIBRAMYCIN) 100 MG capsule Take 100 mg by mouth 2 (two) times daily. 11/12/20   [provider]  estradiol (ESTRACE) 0.5 MG tablet Take 1 mg by mouth at bedtime.     [provider]  EVZIO 0.4 MG/0.4ML SOAJ AUTO INJECT AS NEEDED IN CASE OF EMERGENCY WHERE GRANDCHILDREN MAY GET A HOLD OF PATIENTS PAIN  MEDICATIONS. 11/14/14   [provider]  fluticasone (FLONASE) 50 MCG/ACT nasal spray SPRAY 2 SPRAYS INTO EACH NOSTRIL DAILY Patient taking differently: Place 2 sprays into both nostrils daily. 12/15/16   Bobbitt, [place] Muta, MD  gabapentin (NEURONTIN) 300 MG capsule Take 900 mg by mouth 3 (three) times daily.  03/15/16   [provider]  hydrochlorothiazide (MICROZIDE) 12.5 MG capsule Take 12.5 mg by mouth daily. 04/16/18   [provider]  HYDROmorphone (DILAUDID) 2 MG tablet Take 2 mg by mouth every 4 (four)  hours as needed for severe pain.    [provider]  HYDROmorphone (DILAUDID) 2 MG tablet Take 1 tablet (2 mg total) by mouth every 8 (eight) hours as needed for up to 7 days for severe pain. Must last 7 days. 03/18/20 03/25/20  Milinda Pointer, MD  hydrOXYzine (ATARAX/VISTARIL) 25 MG tablet Take 50 mg by mouth every 4 (four) hours as needed for itching. Alternates with benadryl    [provider]  ipratropium (ATROVENT) 0.06 % nasal spray Place 2 sprays into both nostrils 3 (three) times daily. 07/05/18   [provider]  ketoconazole (NIZORAL) 2 % shampoo One application once a month as needed for flaking 11/19/14   [provider]  ketorolac (TORADOL) 60 MG/2ML SOLN injection Inject 60 mg (2 mL) into the muscle every 6 hours as needed for headache. Max 2 doses (120 mg) in 24 hours. Do not use more than 4 days per month. 11/17/20   Melvenia Beam, MD  Lemborexant (DAYVIGO) 10 MG TABS Take 10 mg by mouth at bedtime.    [provider]  levothyroxine (SYNTHROID, LEVOTHROID) 50 MCG tablet Take 200 mcg by mouth daily. Pt can only take name brand    [provider]  Misc Natural Products (APPLE CIDER VINEGAR DIET PO) Take by mouth.    [provider]  Multiple Vitamin (MULTIVITAMIN WITH MINERALS) TABS tablet Take 1 tablet by mouth at bedtime.    [provider]  NON FORMULARY 1 mL by Subdermal route  every Tuesday. Allergy vaccine    [provider]  OnabotulinumtoxinA (BOTOX IJ) Inject as directed every 3 (three) months. For migraines    [provider]  potassium chloride (MICRO-K) 10 MEQ CR capsule Take 10 mEq by mouth 2 (two) times daily.    [provider]  prochlorperazine (COMPAZINE) 10 MG tablet Take 1 tablet (10 mg total) by mouth every 6 (six) hours as needed for nausea or vomiting. 07/17/19   Melvenia Beam, MD  promethazine (PHENERGAN) 25 MG suppository Place 1 suppository (25 mg total) rectally every 6 (six) hours as needed for nausea or vomiting. 07/25/18   Melvenia Beam, MD  rizatriptan (MAXALT-MLT) 10 MG disintegrating tablet Take 1 tablet (10 mg total) by mouth as needed for migraine. May repeat in 2 hours if needed 08/12/20   Melvenia Beam, MD  tiZANidine (ZANAFLEX) 4 MG tablet Take 4 mg by mouth 3 (three) times daily.     [provider]  topiramate (TOPAMAX) 25 MG tablet Take 200 mg by mouth 2 (two) times daily.  04/15/15   [provider]  Ubrogepant (UBRELVY) 100 MG TABS Take 100 mg by mouth every 2 (two) hours as needed. Maximum 200mg  a day. 08/12/20   Melvenia Beam, MD  vitamin C (ASCORBIC ACID) 500 MG tablet Take 1,000 mg by mouth daily.    [provider]  Past Surgical History Past Surgical History:  Procedure Laterality Date   ANTERIOR CERVICAL DECOMP/DISCECTOMY FUSION  03/10/2009   C5-6, C6-7   ANTERIOR CERVICAL DECOMP/DISCECTOMY FUSION N/A 07/16/2015   Procedure: ANTERIOR CERVICAL DECOMPRESSION FUSION 4-5 WITH INSTRUMENTATION AND AUTOGRAFT;  Surgeon: Phylliss Bob, MD;  [place];  Service: Orthopedics;  Laterality: N/A;  ANTERIOR CERVICAL DECOMPRESSION FUSION 4-5 WITH INSTRUMENTATION AND ALLOGRAFT   APPENDECTOMY  ~ [place] Bilateral 1996; 2001, 2016    BREAST SURGERY  age 68   accessory breast tissue exc   CARPAL TUNNEL RELEASE Bilateral right 07/26/2001;  left ?   CARPOMETACARPEL SUSPENSION PLASTY Right 02/10/2015   Procedure: RIGHT THUMB SUSPENSION  ARTHROPLASTY;  Surgeon: Milly Jakob, MD;  Location: [facility];  Service: Orthopedics;  Laterality: Right;  ANESTHESIA: PRE- OP BLOCK   CYSTO WITH HYDRODISTENSION  01/29/2011   Procedure: CYSTOSCOPY/HYDRODISTENSION;  Surgeon: Claybon Jabs, MD;  Location[facility];  Service: Urology;  Laterality: N/A;   CYSTO WITH HYDRODISTENSION N/A 05/08/2014   Procedure: CYSTOSCOPY/HYDRODISTENSION WITH BILATERAL RETROGRADES;  Surgeon: Ardis Hughs, MD;  Location: [facility];  Service: Urology;  Laterality: N/A;   CYSTO WITH HYDRODISTENSION N/A 03/27/2015   Procedure: CYSTOSCOPY/HYDRODISTENSION OF BLADDER, INSTILLATION OF MARCAINE/PYRIDIUM;  Surgeon: Ardis Hughs, MD;  Location: [facility];  Service: Urology;  Laterality: N/A;   CYSTO/  BLADDER BX  03/08/2008   CYSTO/ URETHRAL DILATION/ HYDRODISTENTION/ BLADDER BX  04/10/2003   CYSTOSCOPY W/ RETROGRADES  01/29/2011   Procedure: CYSTOSCOPY WITH RETROGRADE PYELOGRAM;  Surgeon: Claybon Jabs, MD;  Location[facility];  Service: Urology;  Laterality: Bilateral;   CYSTOSCOPY WITH HYDRODISTENSION AND BIOPSY N/A 06/29/2017   Procedure: CYSTOSCOPY/HYDRODISTENSION;  Surgeon: Ardis Hughs, MD;  Location: [facility];  Service: Urology;  Laterality: N/A;   CYSTOSCOPY WITH STENT PLACEMENT  2006   URETERAL   DEBRIDEMENT RIGHT THUMB MP JOINT Right 04/10/2002   DECOMPRESSION LEFT ULNAR NERVE, ELBOW Left 01/29/2003   2nd time in 2015   ESOPHAGOGASTRODUODENOSCOPY (EGD) WITH ESOPHAGEAL DILATION  10-26-2000   EUS N/A 04/23/2016   Procedure: UPPER ENDOSCOPIC ULTRASOUND (EUS) LINEAR;  Surgeon: Carol Ada, MD;  Location: [facility];  Service: Endoscopy;  Laterality:  N/A;   EXCISION HAGLUND'S DEFORMITY WITH ACHILLES TENDON REPAIR Bilateral 2013-2014   FISSURECTOMY  ~ 1988 x2   GANGLION CYST EXCISION Right 02/10/2015   Procedure: REMOVAL GANGLION OF WRIST, PARTIAL ULNAR EXCISION;  Surgeon: Milly Jakob, MD;  Location: [facility];  Service: Orthopedics;  Laterality: Right;   IR FLUORO GUIDE PORT INSERTION RIGHT  11/04/2016   IR US GUIDE VASC ACCESS RIGHT  11/04/2016   KIDNEY STONE SURGERY     KNEE ARTHROSCOPY Right 08/16/2000   KNEE ARTHROSCOPY WITH FULKERSON SLIDE Right 11/29/2000   LAPAROSCOPIC ASSISTED VAGINAL HYSTERECTOMY  Feb 1999   LAPAROSCOPIC CHOLECYSTECTOMY  03/04/2002   LAPAROSCOPIC LYSIS OF ADHESIONS  02/11/2012   Procedure: LAPAROSCOPIC LYSIS OF ADHESIONS;  Surgeon: Cheri Fowler, MD;  Location: [facility];  Service: Gynecology;;   LAPAROSCOPY  02/11/2012   Procedure: LAPAROSCOPY DIAGNOSTIC;  Surgeon: Cheri Fowler, MD;  Location: [facility];  Service: Gynecology;  Laterality: N/A;  Diagnostic  Operative Laparoscopic    LAPAROSCOPY ADHESIOLYSIS AND REMOVAL POSSIBLE RIGHT OVARY REMNANT  04/18/2000   LAPAROSCOPY LYSIS ADHESIONS/  BILATERAL SALPINGOOPHORECTOMY/  FULGERATION OF ENDOMETRIOSIS  1998   LYMPH NODE BIOPSY Left 07/24/2018   Procedure: CERVICAL LYMPH NODE EXCISION;  Surgeon: Melida Quitter, MD;  Location: [facility];  Service: ENT;  Laterality: Left;   MELANOMA EXCISION Left 2013   "behind knee"    NASAL SINUS SURGERY  2013   NEGATIVE SLEEP STUDY  2006 approx .  per pt   REFRACTIVE SURGERY Bilateral ~ 1999; ~ 2013   RIGHT THUMB FUSION OF MPJ Right 10/03/2002   RIGHT TOTAL KNEE REVISION ARTHROPLASTY Right 03/14/2007   SHOULDER ARTHROSCOPY Left    put back in place   SHOULDER HEMI-ARTHROPLASTY Left 12/04/2015   Procedure: SHOULDER HEMI-ARTHROPLASTY;  Surgeon: Tania Ade, MD;  Location: [facility];  Service: Orthopedics;  Laterality: Left;  Left shoulder hemiarthroplasty   sympathetic nerve ablation  07/05/2017   TONSILLECTOMY AND  ADENOIDECTOMY  ~ [place] Right 09/10/2003; 12/08/2012   TOTAL THYROIDECTOMY  1993   TRANSANAL HEMORRHOIDAL DEARTERIALIZATION  03/26/2011   TRANSTHORACIC ECHOCARDIOGRAM  09/22/2012   TRIGGER FINGER RELEASE Left 07/06/2016   Procedure: LEFT THUMB TRIGGER RELEASE;  Surgeon: Milly Jakob, MD;  Location: [place];  Service: Orthopedics;  Laterality: Left;   VIDEO BRONCHOSCOPY Bilateral 10/20/2015   Procedure: VIDEO BRONCHOSCOPY WITHOUT FLUORO;  Surgeon: Collene Gobble, MD;  Location: [facility];  Service: Cardiopulmonary;  Laterality: Bilateral;   Family History Family History  Problem Relation Age of Onset   Anesthesia problems Mother    Leukemia Mother 32   Ovarian cancer Mother 41   Cervical cancer Daughter        cervical   Breast cancer Maternal Aunt        age dx unk   Skin cancer Maternal Grandmother    Throat cancer Maternal Grandfather    Thyroid cancer Maternal Aunt    Lung cancer Maternal Aunt    Cervical cancer Maternal Aunt    Cervical cancer Maternal Aunt    Thyroid cancer Maternal Uncle    Cancer Maternal Aunt        sinus   Prostate cancer Other    Brain cancer Cousin    Other Cousin        brain tumor   Leukemia Cousin    Bone cancer Cousin    Cervical cancer Cousin     Social History Social History   Tobacco Use   Smoking status: Former    Types: Cigarettes   Smokeless tobacco: Never   Tobacco comments:    stopped in 2019  Vaping Use   Vaping Use: Some days   Substances: Nicotine, Flavoring  Substance Use Topics   Alcohol use: Not Currently    Alcohol/week: 0.0 standard drinks    Comment: rarely   Drug use: No   Allergies Betadine [povidone-iodine], Cobalt, Contrast media [iodinated diagnostic agents], Latex, Nickel, Peanuts [peanut oil], Red dye, Azithromycin, Chloraprep one step [chlorhexidine gluconate], Corticosteroids, Flexeril [cyclobenzaprine], Morphine and related, Other, Percocet  [oxycodone-acetaminophen], Supartz [sodium hyaluronate (avian)], Surgical lubricant, Tape, Trazodone, Yellow dye, Yellow dyes (non-tartrazine), Cephalexin, Monistat [miconazole], Prednisone, Sulfa antibiotics, Tea, and Soap  Review of Systems Review of Systems All other systems are reviewed and are negative for acute change except as noted in the HPI  Physical Exam Vital Signs  I have reviewed the triage vital signs BP 93/66   Pulse 98   Temp 98 F (36.7 C) (Oral)   Resp 14   Ht 5\' 4"  (1.626 m)   Wt 59 kg   SpO2 95%   BMI 22.31 kg/m   Physical Exam Vitals reviewed.  Constitutional:  General: She is not in acute distress.    Appearance: She is well-developed. She is not diaphoretic.  HENT:     Head: Normocephalic and atraumatic.     Nose: Nose normal.  Eyes:     General: No scleral icterus.       Right eye: No discharge.        Left eye: No discharge.     Conjunctiva/sclera: Conjunctivae normal.     Pupils: Pupils are equal, round, and reactive to light.  Cardiovascular:     Rate and Rhythm: Regular rhythm. Tachycardia present.     Heart sounds: No murmur heard.   No friction rub. No gallop.  Pulmonary:     Effort: Pulmonary effort is normal. No respiratory distress.     Breath sounds: Normal breath sounds. No stridor. No rales.  Abdominal:     General: There is no distension.     Palpations: Abdomen is soft.     Tenderness: There is no abdominal tenderness.  Musculoskeletal:        General: No tenderness.     Cervical back: Normal range of motion and neck supple.  Skin:    General: Skin is warm and dry.     Findings: No erythema or rash.  Neurological:     Mental Status: She is alert and oriented to person, place, and time.     Sensory: Sensation is intact.     Motor: Motor function is intact.    ED Results and Treatments Labs (all labs ordered are listed, but only abnormal results are displayed) Labs Reviewed  COMPREHENSIVE METABOLIC PANEL - Abnormal;  Notable for the following components:      Result Value   Potassium 3.2 (*)    Glucose, Bld 130 (*)    BUN 23 (*)    Creatinine, Ser 1.23 (*)    GFR, Estimated 52 (*)    All other components within normal limits  CBC WITH DIFFERENTIAL/PLATELET  MAGNESIUM  TROPONIN I (HIGH SENSITIVITY)  TROPONIN I (HIGH SENSITIVITY)                                                                                                                         EKG  EKG Interpretation  Date/Time:  Tuesday November 18 2020 04:47:25 EDT Ventricular Rate:  122 PR Interval:  144 QRS Duration: 91 QT Interval:  358 QTC Calculation: 510 R Axis:   62 Text Interpretation: Sinus tachycardia Consider right atrial enlargement Low voltage, extremity leads Borderline prolonged QT interval Baseline wander in lead(s) I aVR Rate faster Confirmed by Addison Lank [phone]) on 11/18/2020 5:19:21 AM       Radiology DG Chest 2 View  Result Date: 11/18/2020 CLINICAL DATA:  Tachycardia. EXAM: CHEST - 2 VIEW COMPARISON:  04/29/2020 FINDINGS: A right jugular Port-A-Cath terminates over the mid SVC, unchanged. The cardiomediastinal silhouette is unchanged with normal heart size. Lung volumes are lower than on the prior study. No airspace consolidation, edema, pleural effusion, or  pneumothorax is identified. Surgical clips are again seen in the midline of the neck and right upper abdomen, and a left shoulder arthroplasty is again noted. IMPRESSION: No active cardiopulmonary disease. Electronically Signed   By: Logan Bores M.D.   On: 11/18/2020 05:45    Pertinent labs & imaging results that were available during my care of the patient were reviewed by me and considered in my medical decision making (see MDM for details).  Medications Ordered in ED Medications  sodium chloride 0.9 % bolus 1,000 mL (0 mLs Intravenous Hold 11/18/20 0523)  prochlorperazine (COMPAZINE) tablet 5 mg (5 mg Oral Given 11/18/20 0636)                                                                                                                                      Procedures Procedures  (including critical care time)  Medical Decision Making / ED Course I have reviewed the nursing notes for this encounter and the patient's prior records (if available in EHR or on provided paperwork).  MILANYA SUNDERLAND was evaluated in Emergency Department on 11/18/2020 for the symptoms described in the history of present illness. She was evaluated in the context of the global COVID-19 pandemic, which necessitated consideration that the patient might be at risk for infection with the SARS-CoV-2 virus that causes COVID-19. Institutional protocols and algorithms that pertain to the evaluation of patients at risk for COVID-19 are in a state of rapid change based on information released by regulatory bodies including the CDC and federal and state organizations. These policies and algorithms were followed during the patient's care in the ED.     EKG was sinus tachycardia.  This resolved without intervention.  No acute ischemic changes or evidence of pericarditis. She is noted to to have orthostatic tachycardia with position changes. Initial troponin negative. Chest discomfort resolved after tachycardia improved. Low suspicion for ACS but will obtain a delta troponin to ensure.  Presentation not classic for aortic dissection or esophageal perforation. Low suspicion for pulmonary embolism.  Pertinent labs & imaging results that were available during my care of the patient were reviewed by me and considered in my medical decision making:  Labs notable for mild hypokalemia.  Can replete orally at home.  Already has prescription for potassium supplements.  Rest of the labs reassuring without leukocytosis or anemia. Mild renal insufficiency without AKI. Likely from GI losses. Patient encouraged to hydrate orally.   Patient care turned over to oncoming provider. Patient  case and results discussed in detail; please see their note for further ED managment.     Final Clinical Impression(s) / ED Diagnoses Final diagnoses:  Tachycardia  Chest discomfort  Hypokalemia     This chart was dictated using voice recognition software.  Despite best efforts to proofread,  errors can occur which can change the documentation meaning.    Fatima Blank, MD 11/18/20 [phone]

## 2020-11-18 NOTE — ED Notes (Signed)
Pt ambulatory with steady gait to restroom, standby assist from family member

## 2020-11-18 NOTE — ED Notes (Signed)
PT states desire to go home. Was under impression that if port will not be accesses she will go home.

## 2020-11-18 NOTE — ED Provider Notes (Signed)
Sign out note  55 year old lady with history of paroxysmal A. fib.  Had episode of chest pressure and discomfort in both wrists.  EKG without acute ischemic change and troponin negative.  Labs noted for mild hypokalemia.  Plan to repeat troponin and discharge patient home.  7:30 AM received sign out from [facility]  8:33 AM notified by RN, patient refusing blood draw for repeat troponin.  Rechecked patient, no ongoing symptoms at present, she again affirms that she does not wish to have any additional blood draws and to be discharged home. Will dc home at this time.    Lucrezia Starch, MD 11/18/20 [phone]

## 2020-11-18 NOTE — ED Notes (Signed)
Pt given water and encouraged oral hydration

## 2020-11-18 NOTE — ED Triage Notes (Signed)
Pt c/o tingling hands and in face that started 1 hour ago. Pt states she ahs been in Afib since yesterday.

## 2020-11-24 ENCOUNTER — Other Ambulatory Visit: Payer: BC Managed Care – PPO

## 2020-11-24 ENCOUNTER — Ambulatory Visit
Admission: RE | Admit: 2020-11-24 | Discharge: 2020-11-24 | Disposition: A | Discharge status: Home / Self Care | Payer: BC Managed Care – PPO | Source: Ambulatory Visit | Attending: Obstetrics and Gynecology | Admitting: Obstetrics and Gynecology

## 2020-11-24 ENCOUNTER — Other Ambulatory Visit: Payer: Self-pay

## 2020-11-24 DIAGNOSIS — R922 Inconclusive mammogram: Secondary | ICD-10-CM | POA: Diagnosis not present

## 2020-11-24 DIAGNOSIS — N644 Mastodynia: Secondary | ICD-10-CM

## 2020-11-28 DIAGNOSIS — M25561 Pain in right knee: Secondary | ICD-10-CM | POA: Diagnosis not present

## 2020-11-28 DIAGNOSIS — M879 Osteonecrosis, unspecified: Secondary | ICD-10-CM | POA: Diagnosis not present

## 2020-11-28 DIAGNOSIS — Z79899 Other long term (current) drug therapy: Secondary | ICD-10-CM | POA: Diagnosis not present

## 2020-11-28 DIAGNOSIS — G8929 Other chronic pain: Secondary | ICD-10-CM | POA: Diagnosis not present

## 2020-11-28 DIAGNOSIS — M8700 Idiopathic aseptic necrosis of unspecified bone: Secondary | ICD-10-CM | POA: Diagnosis not present

## 2020-12-02 DIAGNOSIS — Z79899 Other long term (current) drug therapy: Secondary | ICD-10-CM | POA: Diagnosis not present

## 2020-12-03 NOTE — Telephone Encounter (Signed)
Received (2) 100 unit vials of Botox today from [facility] for patient's December appt.

## 2020-12-18 DIAGNOSIS — E039 Hypothyroidism, unspecified: Secondary | ICD-10-CM | POA: Diagnosis not present

## 2020-12-18 DIAGNOSIS — Y834 Other reconstructive surgery as the cause of abnormal reaction of the patient, or of later complication, without mention of misadventure at the time of the procedure: Secondary | ICD-10-CM | POA: Diagnosis not present

## 2020-12-18 DIAGNOSIS — M25461 Effusion, right knee: Secondary | ICD-10-CM | POA: Diagnosis not present

## 2020-12-18 DIAGNOSIS — R110 Nausea: Secondary | ICD-10-CM | POA: Diagnosis not present

## 2020-12-18 DIAGNOSIS — Z23 Encounter for immunization: Secondary | ICD-10-CM | POA: Diagnosis not present

## 2020-12-18 DIAGNOSIS — X58XXXD Exposure to other specified factors, subsequent encounter: Secondary | ICD-10-CM | POA: Diagnosis not present

## 2020-12-18 DIAGNOSIS — Z09 Encounter for follow-up examination after completed treatment for conditions other than malignant neoplasm: Secondary | ICD-10-CM | POA: Diagnosis not present

## 2020-12-18 DIAGNOSIS — Z7952 Long term (current) use of systemic steroids: Secondary | ICD-10-CM | POA: Diagnosis not present

## 2020-12-18 DIAGNOSIS — T8453XD Infection and inflammatory reaction due to internal right knee prosthesis, subsequent encounter: Secondary | ICD-10-CM | POA: Diagnosis not present

## 2020-12-18 DIAGNOSIS — Z889 Allergy status to unspecified drugs, medicaments and biological substances status: Secondary | ICD-10-CM | POA: Diagnosis not present

## 2020-12-18 DIAGNOSIS — Z96651 Presence of right artificial knee joint: Secondary | ICD-10-CM | POA: Diagnosis not present

## 2020-12-18 DIAGNOSIS — T8450XD Infection and inflammatory reaction due to unspecified internal joint prosthesis, subsequent encounter: Secondary | ICD-10-CM | POA: Diagnosis not present

## 2020-12-18 DIAGNOSIS — Z792 Long term (current) use of antibiotics: Secondary | ICD-10-CM | POA: Diagnosis not present

## 2020-12-19 DIAGNOSIS — Z86010 Personal history of colonic polyps: Secondary | ICD-10-CM | POA: Diagnosis not present

## 2020-12-19 DIAGNOSIS — Z9889 Other specified postprocedural states: Secondary | ICD-10-CM | POA: Diagnosis not present

## 2020-12-19 DIAGNOSIS — K209 Esophagitis, unspecified without bleeding: Secondary | ICD-10-CM | POA: Diagnosis not present

## 2020-12-19 DIAGNOSIS — Z1211 Encounter for screening for malignant neoplasm of colon: Secondary | ICD-10-CM | POA: Diagnosis not present

## 2020-12-19 DIAGNOSIS — K2950 Unspecified chronic gastritis without bleeding: Secondary | ICD-10-CM | POA: Diagnosis not present

## 2020-12-19 DIAGNOSIS — Z8719 Personal history of other diseases of the digestive system: Secondary | ICD-10-CM | POA: Diagnosis not present

## 2020-12-19 DIAGNOSIS — K208 Other esophagitis without bleeding: Secondary | ICD-10-CM | POA: Diagnosis not present

## 2020-12-19 DIAGNOSIS — R1013 Epigastric pain: Secondary | ICD-10-CM | POA: Diagnosis not present

## 2020-12-19 DIAGNOSIS — K2970 Gastritis, unspecified, without bleeding: Secondary | ICD-10-CM | POA: Diagnosis not present

## 2020-12-25 ENCOUNTER — Other Ambulatory Visit: Payer: Self-pay | Admitting: Neurology

## 2020-12-29 DIAGNOSIS — M879 Osteonecrosis, unspecified: Secondary | ICD-10-CM | POA: Diagnosis not present

## 2020-12-29 DIAGNOSIS — S345XXS Injury of lumbar, sacral and pelvic sympathetic nerves, sequela: Secondary | ICD-10-CM | POA: Diagnosis not present

## 2020-12-29 DIAGNOSIS — M8700 Idiopathic aseptic necrosis of unspecified bone: Secondary | ICD-10-CM | POA: Diagnosis not present

## 2020-12-29 DIAGNOSIS — Z79899 Other long term (current) drug therapy: Secondary | ICD-10-CM | POA: Diagnosis not present

## 2020-12-29 DIAGNOSIS — M797 Fibromyalgia: Secondary | ICD-10-CM | POA: Diagnosis not present

## 2020-12-31 DIAGNOSIS — Z79899 Other long term (current) drug therapy: Secondary | ICD-10-CM | POA: Diagnosis not present

## 2021-01-12 DIAGNOSIS — S0502XA Injury of conjunctiva and corneal abrasion without foreign body, left eye, initial encounter: Secondary | ICD-10-CM | POA: Diagnosis not present

## 2021-01-13 ENCOUNTER — Other Ambulatory Visit: Payer: Self-pay | Admitting: Neurology

## 2021-01-19 DIAGNOSIS — S0502XA Injury of conjunctiva and corneal abrasion without foreign body, left eye, initial encounter: Secondary | ICD-10-CM | POA: Diagnosis not present

## 2021-01-30 ENCOUNTER — Other Ambulatory Visit: Payer: Self-pay | Admitting: Cardiology

## 2021-01-30 DIAGNOSIS — R079 Chest pain, unspecified: Secondary | ICD-10-CM

## 2021-01-30 DIAGNOSIS — R072 Precordial pain: Secondary | ICD-10-CM

## 2021-01-30 DIAGNOSIS — R002 Palpitations: Secondary | ICD-10-CM

## 2021-01-30 DIAGNOSIS — E7800 Pure hypercholesterolemia, unspecified: Secondary | ICD-10-CM

## 2021-02-09 DIAGNOSIS — M25561 Pain in right knee: Secondary | ICD-10-CM | POA: Diagnosis not present

## 2021-02-09 DIAGNOSIS — Z79899 Other long term (current) drug therapy: Secondary | ICD-10-CM | POA: Diagnosis not present

## 2021-02-09 DIAGNOSIS — M797 Fibromyalgia: Secondary | ICD-10-CM | POA: Diagnosis not present

## 2021-02-09 DIAGNOSIS — M25511 Pain in right shoulder: Secondary | ICD-10-CM | POA: Diagnosis not present

## 2021-02-09 DIAGNOSIS — M8700 Idiopathic aseptic necrosis of unspecified bone: Secondary | ICD-10-CM | POA: Diagnosis not present

## 2021-02-09 DIAGNOSIS — Z6822 Body mass index (BMI) 22.0-22.9, adult: Secondary | ICD-10-CM | POA: Diagnosis not present

## 2021-02-09 DIAGNOSIS — R030 Elevated blood-pressure reading, without diagnosis of hypertension: Secondary | ICD-10-CM | POA: Diagnosis not present

## 2021-02-09 DIAGNOSIS — G8929 Other chronic pain: Secondary | ICD-10-CM | POA: Diagnosis not present

## 2021-02-11 ENCOUNTER — Ambulatory Visit: Payer: Medicare Other | Admitting: Neurology

## 2021-02-11 DIAGNOSIS — Z79899 Other long term (current) drug therapy: Secondary | ICD-10-CM | POA: Diagnosis not present

## 2021-02-11 NOTE — Progress Notes (Deleted)
Consent Form Botulism Toxin Injection For Chronic Migraine  11/17/2020: Doing great > 60% improvement in frequency and severity of migraines  Reviewed orally with patient, additionally signature is on file:  Botulism toxin has been approved by the Federal drug administration for treatment of chronic migraine. Botulism toxin does not cure chronic migraine and it may not be effective in some patients.  The administration of botulism toxin is accomplished by injecting a small amount of toxin into the muscles of the neck and head. Dosage must be titrated for each individual. Any benefits resulting from botulism toxin tend to wear off after 3 months with a repeat injection required if benefit is to be maintained. Injections are usually done every 3-4 months with maximum effect peak achieved by about 2 or 3 weeks. Botulism toxin is expensive and you should be sure of what costs you will incur resulting from the injection.  The side effects of botulism toxin use for chronic migraine may include:   -Transient, and usually mild, facial weakness with facial injections  -Transient, and usually mild, head or neck weakness with head/neck injections  -Reduction or loss of forehead facial animation due to forehead muscle weakness  -Eyelid drooping  -Dry eye  -Pain at the site of injection or bruising at the site of injection  -Double vision  -Potential unknown long term risks  Contraindications: You should not have Botox if you are pregnant, nursing, allergic to albumin, have an infection, skin condition, or muscle weakness at the site of the injection, or have myasthenia gravis, Lambert-Eaton syndrome, or ALS.  It is also possible that as with any injection, there may be an allergic reaction or no effect from the medication. Reduced effectiveness after repeated injections is sometimes seen and rarely infection at the injection site may occur. All care will be taken to prevent these side effects. If therapy  is given over a long time, atrophy and wasting in the muscle injected may occur. Occasionally the patient's become refractory to treatment because they develop antibodies to the toxin. In this event, therapy needs to be modified.  I have read the above information and consent to the administration of botulism toxin.    BOTOX PROCEDURE NOTE FOR MIGRAINE HEADACHE    Contraindications and precautions discussed with patient(above). Aseptic procedure was observed and patient tolerated procedure. Procedure performed by Dr. Georgia Dom  The condition has existed for more than 6 months, and pt does not have a diagnosis of ALS, Myasthenia Gravis or Lambert-Eaton Syndrome.  Risks and benefits of injections discussed and pt agrees to proceed with the procedure.  Written consent obtained  These injections are medically necessary. Pt  receives good benefits from these injections. These injections do not cause sedations or hallucinations which the oral therapies may cause.  Description of procedure:  The patient was placed in a sitting position. The standard protocol was used for Botox as follows, with 5 units of Botox injected at each site:   -Procerus muscle, midline injection  -Corrugator muscle, bilateral injection  -Frontalis muscle, bilateral injection, with 2 sites each side, medial injection was performed in the upper one third of the frontalis muscle, in the region vertical from the medial inferior edge of the superior orbital rim. The lateral injection was again in the upper one third of the forehead vertically above the lateral limbus of the cornea, 1.5 cm lateral to the medial injection site.  -Temporalis muscle injection, 4 sites, bilaterally. The first injection was 3 cm above the tragus  of the ear, second injection site was 1.5 cm to 3 cm up from the first injection site in line with the tragus of the ear. The third injection site was 1.5-3 cm forward between the first 2 injection sites.  The fourth injection site was 1.5 cm posterior to the second injection site.   -Occipitalis muscle injection, 3 sites, bilaterally. The first injection was done one half way between the occipital protuberance and the tip of the mastoid process behind the ear. The second injection site was done lateral and superior to the first, 1 fingerbreadth from the first injection. The third injection site was 1 fingerbreadth superiorly and medially from the first injection site.  -Cervical paraspinal muscle injection, 2 sites, bilateral knee first injection site was 1 cm from the midline of the cervical spine, 3 cm inferior to the lower border of the occipital protuberance. The second injection site was 1.5 cm superiorly and laterally to the first injection site.  -Trapezius muscle injection was performed at 3 sites, bilaterally. The first injection site was in the upper trapezius muscle halfway between the inflection point of the neck, and the acromion. The second injection site was one half way between the acromion and the first injection site. The third injection was done between the first injection site and the inflection point of the neck.   Will return for repeat injection in 3 months.   155 units of Botox was used, 45u Botox not injected was wasted. The patient tolerated the procedure well, there were no complications of the above procedure.

## 2021-02-12 ENCOUNTER — Ambulatory Visit (INDEPENDENT_AMBULATORY_CARE_PROVIDER_SITE_OTHER): Payer: BC Managed Care – PPO | Admitting: Neurology

## 2021-02-12 ENCOUNTER — Other Ambulatory Visit: Payer: Self-pay

## 2021-02-12 DIAGNOSIS — H5213 Myopia, bilateral: Secondary | ICD-10-CM | POA: Diagnosis not present

## 2021-02-12 DIAGNOSIS — G43709 Chronic migraine without aura, not intractable, without status migrainosus: Secondary | ICD-10-CM | POA: Diagnosis not present

## 2021-02-12 NOTE — Progress Notes (Signed)
Botox- 100 units x 2 vials Lot: [id number] Expiration: 12/2022 NDC: 1464-3142-76  0.9% Sodium Chloride- 76mL total Lot: [id number] Expiration: 01/2022 NDC: 49611-643-53  Dx: P12.258 S/P

## 2021-02-12 NOTE — Progress Notes (Signed)
Consent Form Botulism Toxin Injection For Chronic Migraine    02/12/2021: For botox for migrine: With "follow the pain protocol" we can inject extra in the procerus and corrugators withut problem as that is where her headachea are, inject frontalis high up near the hairline because of drooping problems in the past and difficulty with eyesight due to brow droop. . masseters are not part of the botox protocol so we will have to get additional approval see below.   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 Botox in the past at [facility]geon's office has improved her symptoms by 60% and improved her pain and quality of life.   She clenches all night and has morning headaches. Has tried mouth guards  She has facial botox approved by Dr. Lacinda Axon [id number]. She has approval for botox into the face for dystonia. Will call and see if we can get that transferred to [place] and what the diagnosis was so we can inject into her masseters and other facial muscles as needed, will see what Dr. Jonathon Jordan protocol is and get medical records, we could not do it at the same time as botox for migraine (10 day window) and will need to charge an extra procedure code. We will need to see how much they got approved and how much helped her, she will sign a medical release form.    11/17/2020: Doing great > 60% improvement in frequency and severity of migraines  Reviewed orally with patient, additionally signature is on file:  Botulism toxin has been approved by the Federal drug administration for treatment of  chronic migraine. Botulism toxin does not cure chronic migraine and it may not be effective in some patients.  The administration of botulism toxin is accomplished by injecting a small amount of toxin into the muscles of the neck and head. Dosage must be titrated for each individual. Any benefits resulting from botulism toxin tend to wear off after 3 months with a repeat injection required if benefit is to be maintained. Injections are usually done every 3-4 months with maximum effect peak achieved by about 2 or 3 weeks. Botulism toxin is expensive and you should be sure of what costs you will incur resulting from the injection.  The side effects of botulism toxin use for chronic migraine may include:   -Transient, and usually mild, facial weakness with facial injections  -Transient, and usually mild, head or neck weakness with head/neck injections  -Reduction or loss of forehead facial animation due to forehead muscle weakness  -Eyelid drooping  -Dry eye  -Pain at the site of injection or bruising at the site of injection  -Double vision  -Potential unknown long term risks  Contraindications: You should not have Botox if you are pregnant, nursing, allergic to albumin, have an infection, skin condition, or muscle weakness at the site of the injection, or have myasthenia gravis, Lambert-Eaton syndrome, or ALS.  It is also possible that as with any injection, there may be an allergic reaction or no effect from the medication. Reduced effectiveness after repeated injections is sometimes seen and rarely infection at the injection  site may occur. All care will be taken to prevent these side effects. If therapy is given over a long time, atrophy and wasting in the muscle injected may occur. Occasionally the patient's become refractory to treatment because they develop antibodies to the toxin. In this event, therapy needs to be modified.  I have read the above information and consent to the  administration of botulism toxin.    BOTOX PROCEDURE NOTE FOR MIGRAINE HEADACHE    Contraindications and precautions discussed with patient(above). Aseptic procedure was observed and patient tolerated procedure. Procedure performed by Dr. Georgia Dom  The condition has existed for more than 6 months, and pt does not have a diagnosis of ALS, Myasthenia Gravis or Lambert-Eaton Syndrome.  Risks and benefits of injections discussed and pt agrees to proceed with the procedure.  Written consent obtained  These injections are medically necessary. Pt  receives good benefits from these injections. These injections do not cause sedations or hallucinations which the oral therapies may cause.  Description of procedure:  The patient was placed in a sitting position. The standard protocol was used for Botox as follows, with 5 units of Botox injected at each site:   -Procerus muscle, midline injection  -Corrugator muscle, bilateral injection  -Frontalis muscle, bilateral injection, with 2 sites each side, medial injection was performed in the upper one third of the frontalis muscle, in the region vertical from the medial inferior edge of the superior orbital rim. The lateral injection was again in the upper one third of the forehead vertically above the lateral limbus of the cornea, 1.5 cm lateral to the medial injection site.  -Temporalis muscle injection, 4 sites, bilaterally. The first injection was 3 cm above the tragus of the ear, second injection site was 1.5 cm to 3 cm up from the first injection site in line with the tragus of the ear. The third injection site was 1.5-3 cm forward between the first 2 injection sites. The fourth injection site was 1.5 cm posterior to the second injection site.   -Occipitalis muscle injection, 3 sites, bilaterally. The first injection was done one half way between the occipital protuberance and the tip of the mastoid process behind the ear. The second injection site  was done lateral and superior to the first, 1 fingerbreadth from the first injection. The third injection site was 1 fingerbreadth superiorly and medially from the first injection site.  -Cervical paraspinal muscle injection, 2 sites, bilateral knee first injection site was 1 cm from the midline of the cervical spine, 3 cm inferior to the lower border of the occipital protuberance. The second injection site was 1.5 cm superiorly and laterally to the first injection site.  -Trapezius muscle injection was performed at 3 sites, bilaterally. The first injection site was in the upper trapezius muscle halfway between the inflection point of the neck, and the acromion. The second injection site was one half way between the acromion and the first injection site. The third injection was done between the first injection site and the inflection point of the neck.   Will return for repeat injection in 3 months.   155 units of Botox was used, 45u Botox not injected was wasted. The patient tolerated the procedure well, there were no complications of the above procedure.

## 2021-02-16 NOTE — Progress Notes (Signed)
PROVIDER NOTE: Information contained herein reflects review and annotations entered in association with encounter. Interpretation of such information and data should be left to medically-trained personnel. Information provided to patient can be located elsewhere in the medical record under "Patient Instructions". Document created using STT-dictation technology, any transcriptional errors that may result from process are unintentional.    Patient: Kari Green  Service Category: E/M  Provider: Gaspar Cola, MD  DOB: 1965/07/06  DOS: 02/17/2021  Specialty: Interventional Pain Management  MRN: [id number]  Setting: Ambulatory outpatient  [facility]P: [facility] [place]  Type: Established Patient    Referring Provider: [facility]  Location: Office  Delivery: Face-to-face     HPI  [name], a 55 y.o. 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 Kari Green primary complain today is Knee Pain (right) Last encounter: My last encounter with her was on Visit date not found. Pertinent problems: Kari Green has Abdominal pain, epigastric; Abdominal adhesions; Chronic low back pain (Bilateral) w/o sciatica; CRPS (complex regional pain syndrome) type I of lower limb (Right); History of arthroplasty of knee (Right); Chronic pain syndrome; Chronic shoulder pain (Left); Chronic knee pain (2ry area of Pain) (Right); Chronic lower extremity pain (1ry area of Pain) (Right); Temporomandibular joint disorder; Fibromyalgia; Muscle cramps at night; Radiculopathy; S/P shoulder hemiarthroplasty (Left); Failed total knee, left ([place]); Chronic knee pain after total replacement of knee joint (Right); Migraine without aura, not refractory; Cancer ([place]); Avascular necrosis of bone (Kari Green); Burn injury; 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); History of revision of total  knee replacement (Right); Right flank pain; Acute postoperative pain; Burning sensation of skin; Shoulder joint pain; Chronic pancreatitis (Kari Green); Chronic hip pain (Bilateral); Somatic dysfunction of sacroiliac joints (Bilateral); Chronic sacroiliac pain (Bilateral); Lumbar facet joint syndrome (Bilateral); Epigastric pain; Chronic abdominal pain; Chronic pain due to injury; Chronic sacroiliac joint pain (Left); 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 Pain Assessment: Severity of Chronic pain is reported as a 8 /10. Location: Knee Right/around right knee. Onset: More than a month ago. Quality: Pins and needles, Throbbing, Radiating. Timing: Constant. Modifying factor(s): ice heat, medications. Vitals:  height is _0  (1.6 m) and weight is 125 lb (56.7 kg). Her temporal temperature is 97.1 F (36.2 C) (abnormal).   Reason for encounter: evaluation of worsening, or previously known (established) problem.  The patient requested this appointment for an evaluation for possible right knee injections.  Review of the medical records indicate that around June 2022 the patient was diagnosed with an infection of her knee prosthesis.  Today we will need to evaluate the status of that infection prior to considering any type of interventional therapies in that area.  The patient's last CBC with differential was done on 11/18/2020 and it was within normal limits.  Around that time, we had the patient scheduled to come in for a right knee genicular nerve radiofrequency ablation, but as it turns out several days before her procedure she developed fever, redness, and swelling.  She went to her orthopedic surgeon and they aspirated the knee joint replacement and found to be infected.  She was immediately sent  to [facility] where she had surgery and she was sent home on IV antibiotics through her port.  The patient refers that her infection completely disappeared and she has been cleared by the orthopedic surgeon for radiofrequency ablation of the genicular nerves.  She wants to have it scheduled.  Today we have entered an order for it but we will also be ordering a CBC with differential, sed rate and C-reactive protein to double check on whether or not she has any more ongoing infection before we accessed the area.  Pharmacotherapy Assessment  Analgesic: No opioid analgesics prescribed by our practice.  The last time that we checked, the patient was getting her pain medications from the [facility].   Monitoring: [place] PMP: PDMP reviewed during this encounter.       Pharmacotherapy: No side-effects or adverse reactions reported. Compliance: No problems identified. Effectiveness: Clinically acceptable.  Hart Rochester, RN  02/17/2021  1:52 PM  Sign when Signing Visit Safety precautions to be maintained throughout the outpatient stay will include: orient to surroundings, keep bed in low position, maintain call bell within reach at all times, provide assistance with transfer out of bed and ambulation.     UDS:  No results found for: SUMMARY   ROS  Constitutional: Denies any fever or chills Gastrointestinal: No reported hemesis, hematochezia, vomiting, or acute GI distress Musculoskeletal: Denies any acute onset joint swelling, redness, loss of ROM, or weakness Neurological: No reported episodes of acute onset apraxia, aphasia, dysarthria, agnosia, amnesia, paralysis, loss of coordination, or loss of consciousness  Medication Review  ALPRAZolam, HYDROmorphone, NON FORMULARY, Naloxone HCl, OnabotulinumtoxinA, botulinum toxin Type A, cefdinir, cyanocobalamin, diphenhydrAMINE, doxycycline, estradiol, fluticasone, gabapentin, hydrOXYzine, hydrochlorothiazide, ketoconazole, ketorolac,  levothyroxine, multivitamin with minerals, potassium chloride, tiZANidine, topiramate, and vitamin C  History Review  Allergy: Ms. Wetherell is allergic to betadine [povidone-iodine], cobalt, contrast media [iodinated contrast media], latex, nickel, peanuts [peanut oil], red dye, azithromycin, chloraprep one step [chlorhexidine gluconate], corticosteroids, flexeril [cyclobenzaprine], morphine and related, other, percocet [oxycodone-acetaminophen], supartz [sodium hyaluronate (avian)], surgical lubricant, tape, trazodone, yellow dye, yellow dyes (non-tartrazine), cephalexin, monistat [miconazole], prednisone, sulfa antibiotics, tea, and soap. Drug: Ms. Dogan  reports no history of drug use. Alcohol:  reports that she does not currently use alcohol. Tobacco:  reports that she has quit smoking. Her smoking use included cigarettes. She has never used smokeless tobacco. Social: Ms. Keagy  reports that she has quit smoking. Her smoking use included cigarettes. She has never used smokeless tobacco. She reports that she does not currently use alcohol. She reports that she does not use drugs. Medical:  has a past medical history of Anemia, Anxiety, Arthritis, Asthma, Avascular necrosis ([place]), Basal cell carcinoma, Chronic migraine without aura, intractable, with status migrainosus, Chronic, continuous use of opioids, Complication of anesthesia, Degenerative disc disease, cervical, Degenerative disc disease, lumbar, Delayed gastric emptying, Difficult intravenous access, Difficulty swallowing pills, Enlarged lymph node in neck, Esophageal dysmotility, Family history of adverse reaction to anesthesia, Family history of breast cancer (08/29/2017), Family history of leukemia (08/29/2017), Family history of ovarian cancer (08/29/2017), Family history of prostate cancer (08/29/2017), Family history of thyroid cancer (08/29/2017), Fatty liver, Fibromyalgia, H/O blood transfusion reaction, Hemangioma of liver,  Hemangioma, renal, Hiatal hernia, History of exercise stress test (05/08/2015), History of kidney stones (04/2014), History of skin cancer, Hypertension, IC (interstitial cystitis), Irritable bowel syndrome (IBS), Knee pain, right, Limited joint range of motion, Melanoma ([place]), Memory impairment, Osteoporosis, Pain syndrome, chronic, Pneumonia, PONV (postoperative nausea and vomiting), Port-A-Cath in place (11/04/2016), Post-surgical hypothyroidism, Sciatica, Seasonal allergies, Sepsis ([place]), and Squamous carcinoma. Surgical: Ms. Jarmon  has a  past surgical history that includes Total thyroidectomy (1993); cysto with hydrodistension (01/29/2011); Cystoscopy w/ retrogrades (01/29/2011); Fissurectomy (~ 1988 x2); Refractive surgery (Bilateral, ~ 1999; ~ 2013); Nasal sinus surgery (2013); Laparoscopic lysis of adhesions (02/11/2012); Excision haglund's deformity with achilles tendon repair (Bilateral, 2013-2014); Melanoma excision (Left, 2013); Cystoscopy with stent placement (2006); Appendectomy (~ 1992); Tonsillectomy and adenoidectomy (~ 1989); LAPAROSCOPY ADHESIOLYSIS AND REMOVAL POSSIBLE RIGHT OVARY REMNANT (04/18/2000); Knee arthroscopy (Right, 08/16/2000); Carpal tunnel release (Bilateral, right 07/26/2001;  left ?); DEBRIDEMENT RIGHT THUMB MP JOINT (Right, 04/10/2002); RIGHT THUMB FUSION OF MPJ (Right, 10/03/2002); DECOMPRESSION LEFT ULNAR NERVE, ELBOW (Left, 01/29/2003); RIGHT TOTAL KNEE REVISION ARTHROPLASTY (Right, 03/14/2007); CYSTO/ URETHRAL DILATION/ HYDRODISTENTION/ BLADDER BX (04/10/2003); Laparoscopic assisted vaginal hysterectomy (Feb 1999); LAPAROSCOPY LYSIS ADHESIONS/  BILATERAL SALPINGOOPHORECTOMY/  FULGERATION OF ENDOMETRIOSIS (1998); Anterior cervical decomp/discectomy fusion (03/10/2009); Esophagogastroduodenoscopy (egd) with esophageal dilation (10-26-2000); CYSTO/  BLADDER BX (03/08/2008); transthoracic echocardiogram (09/22/2012); cysto with hydrodistension (N/A, 05/08/2014); laparoscopy (02/11/2012);  Transanal hemorrhoidal dearterialization (03/26/2011); Knee arthroscopy with fulkerson slide (Right, 11/29/2000); Laparoscopic cholecystectomy (03/04/2002); Total knee arthroplasty (Right, 09/10/2003; 12/08/2012); Carpometacarpel [facility]) suspension plasty (Right, 02/10/2015); Ganglion cyst excision (Right, 02/10/2015); NEGATIVE SLEEP STUDY (2006 approx .  per pt); cysto with hydrodistension (N/A, 03/27/2015); Kidney stone surgery; Anterior cervical decomp/discectomy fusion (N/A, 07/16/2015); Video bronchoscopy (Bilateral, 10/20/2015); Shoulder hemi-arthroplasty (Left, 12/04/2015); EUS (N/A, 04/23/2016); Trigger finger release (Left, 07/06/2016); IR FLUORO GUIDE PORT INSERTION RIGHT (11/04/2016); IR US Guide Vasc Access Right (11/04/2016); Shoulder arthroscopy (Left); Augmentation mammaplasty (Bilateral, 1996; 2001, 2016); Breast surgery (age 49); Cystoscopy with hydrodistension and biopsy (N/A, 06/29/2017); sympathetic nerve ablation (07/05/2017); and Lymph node biopsy (Left, 07/24/2018). Family: family history includes Anesthesia problems in her mother; Bone cancer in her cousin; Brain cancer in her cousin; Breast cancer in her maternal aunt; Cancer in her maternal aunt; Cervical cancer in her cousin, daughter, maternal aunt, and maternal aunt; Leukemia in her cousin; Leukemia (age of onset: 15) in her mother; Lung cancer in her maternal aunt; Other in her cousin; Ovarian cancer (age of onset: 47) in her mother; Prostate cancer in an other family member; Skin cancer in her maternal grandmother; Throat cancer in her maternal grandfather; Thyroid cancer in her maternal aunt and maternal uncle.  Laboratory Chemistry Profile   Renal Lab Results  Component Value Date   BUN 23 (H) 11/18/2020   CREATININE 1.23 (H) 11/18/2020   GFRAA >60 03/23/2019   GFRNONAA 52 (L) 11/18/2020    Hepatic Lab Results  Component Value Date   AST 24 11/18/2020   ALT 12 11/18/2020   ALBUMIN 4.1 11/18/2020   ALKPHOS 82 11/18/2020   AMYLASE 73  03/04/2020   LIPASE 37 04/29/2020    Electrolytes Lab Results  Component Value Date   NA 137 11/18/2020   K 3.2 (L) 11/18/2020   CL 101 11/18/2020   CALCIUM 9.4 11/18/2020   MG 1.9 11/18/2020    Bone No results found for: VD25OH, VD125OH2TOT, RS8546EV0, JJ0093GH8, 25OHVITD1, 25OHVITD2, 25OHVITD3, TESTOFREE, TESTOSTERONE  Inflammation (CRP: Acute Phase) (ESR: Chronic Phase) Lab Results  Component Value Date   ESRSEDRATE 15 09/14/2018   LATICACIDVEN 1.75 09/08/2013         Note: Above Lab results reviewed.  Recent Imaging Review  MM DIAG BREAST W/IMPLANT TOMO BILATERAL CLINICAL DATA:  Diffuse bilateral breast pain  EXAM: DIGITAL DIAGNOSTIC BILATERAL MAMMOGRAM WITH IMPLANTS, CAD AND TOMOSYNTHESIS  TECHNIQUE: Bilateral digital diagnostic mammography and breast tomosynthesis was performed. The images were evaluated with computer-aided detection. Standard and/or implant displaced views were performed.  COMPARISON:  Previous exam(s).  ACR Breast Density Category c: The breast tissue is heterogeneously dense, which may obscure small masses.  FINDINGS: No suspicious masses, calcifications, or distortion are identified. The patient has retropectoral implants.  IMPRESSION: No mammographic evidence of malignancy.  RECOMMENDATION: Treatment of the patient's symptoms should be based on clinical and physical exam given lack of imaging findings. Recommend annual screening mammography.  I have discussed the findings and recommendations with the patient. If applicable, a reminder letter will be sent to the patient regarding the next appointment.  BI-RADS CATEGORY  1: Negative.  Electronically Signed   By: Dorise Bullion III M.D.   On: 11/24/2020 16:29 Note: Reviewed        Physical Exam  General appearance: Well nourished, well developed, and well hydrated. In no apparent acute distress Mental status: Alert, oriented x 3 (person, place, & time)       Respiratory: No  evidence of acute respiratory distress Eyes: PERLA Vitals: Temp (!) 97.1 F (36.2 C) (Temporal)    Ht _0  (1.6 m)    Wt 125 lb (56.7 kg)    BMI 22.14 kg/m  BMI: Estimated body mass index is 22.14 kg/m as calculated from the following:   Height as of this encounter: _1  (1.6 m).   Weight as of this encounter: 125 lb (56.7 kg). Ideal: Ideal body weight: 52.4 kg (115 lb 8.3 oz) Adjusted ideal body weight: 54.1 kg (119 lb 5 oz)  Assessment   Status Diagnosis  Resolved Resolved Persistent 1. 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 Chronic lower extremity pain (1ry area of Pain) (Right)   4. Chronic knee pain (2ry area of Pain) (Right)   5. CRPS (complex regional pain syndrome) type I of lower limb (Right)   6. Chronic knee pain after total replacement of knee joint (Right)      Updated Problems: Problem  Infection and Inflammatory Reaction Due to Internal Right Knee Prosthesis, Sequela   (07/2020)   Chronic Migraine Without Aura Without Status Migrainosus, Not Intractable  Chronic Abdominal Pain  Epigastric Pain  Chronic Pancreatitis (Hcc)  History of Multiple Allergies  Atrial Fibrillation (Hcc)    Plan of Care  Problem-specific:  No problem-specific Assessment & Plan notes found for this encounter.  Ms. ISAMAR NAZIR has a current medication list which includes the following long-term medication(s): estradiol, fluticasone, hydrochlorothiazide, hydromorphone, potassium chloride, and hydromorphone.  Pharmacotherapy (Medications Ordered): No orders of the defined types were placed in this encounter.  Orders:  Orders Placed This Encounter  Procedures   Radiofrequency,Genicular    Standing Status:   Future    Standing Expiration Date:   05/18/2021    Scheduling Instructions:     Side(s): Right Knee     Level(s): Superior-Lateral,  Superior-Medial, and Inferior-Medial Genicular Nerve(s)     Sedation: With Sedation.     Scheduling Timeframe: As soon as pre-approved    Order Specific Question:   Where will this procedure be performed?    Answer:   [facility]   CBC with Differential/Platelet    Cc PCP: [facility] [place]    Order Specific Question:   CC Results    Answer:   [facility] [[id number]]    Order Specific Question:   Release to patient    Answer:   Immediate   Sedimentation rate    Indication: Disorder  of skeletal system (M89.9) Cc PCP: [facility] [place] [facility] [place]    Order Specific Question:   CC Results    Answer:   PCP-NURSE [[id number]]    Order Specific Question:   Release to patient    Answer:   Immediate   C-reactive protein    Indication: Problems influencing health status (Z78.9) Cc PCP: [facility] [place]    Order Specific Question:   CC Results    Answer:   PCP-NURSE [[id number]]    Order Specific Question:   Release to patient    Answer:   Immediate   Follow-up plan:   Return for (70mn), (ECT) procedure: (R) Knee Genicular Nerve RFA, (Sed-anx) (No Steroids) (ABX needed).     Interventional Therapies  Risk   Complexity Considerations:   Latex allergy  Steroid allergy  [facility]  Planned   Pending:      Under consideration:   Diagnostic bilateral SI joint injection  Diagnostic bilateral lumbar facet block  Diagnostic bilateral IA hip joint injection    Completed:   Palliative right genicular nerves RFA x4 (03/18/2020) (100/100/100/90)  Diagnostic right L3, L4 lumbar sympathetic Blk x1 (04/08/2020) (100/100/100 x1 week/50)  Palliative right L3, L4 lumbar sympathetic RFA x4 (06/03/2020) (100/100/90/90)    Therapeutic   Palliative (PRN) options:   Palliative right genicular nerve RFA  Palliative right L3 & L4 lumbar sympathetic RFA     Recent Visits No visits were found  meeting these conditions. Showing recent visits within past 90 days and meeting all other requirements Today's Visits Date Type Provider Dept  02/17/21 Office Visit NMilinda Pointer MD [facility]  Showing today's visits and meeting all other requirements Future Appointments No visits were found meeting these conditions. Showing future appointments within next 90 days and meeting all other requirements  I discussed the assessment and treatment plan with the patient. The patient was provided an opportunity to ask questions and all were answered. The patient agreed with the plan and demonstrated an understanding of the instructions.  Patient advised to call back or seek an in-person evaluation if the symptoms or condition worsens.  Duration of encounter: 30 minutes.  Note by: FGaspar Cola MD Date: 02/17/2021; Time: 2:29 PM

## 2021-02-17 ENCOUNTER — Encounter: Payer: Self-pay | Admitting: Pain Medicine

## 2021-02-17 ENCOUNTER — Ambulatory Visit: Payer: BC Managed Care – PPO | Attending: Pain Medicine | Admitting: Pain Medicine

## 2021-02-17 ENCOUNTER — Other Ambulatory Visit: Payer: Self-pay

## 2021-02-17 VITALS — Temp 97.1°F | Ht 63.0 in | Wt 125.0 lb

## 2021-02-17 DIAGNOSIS — T8453XS Infection and inflammatory reaction due to internal right knee prosthesis, sequela: Secondary | ICD-10-CM | POA: Diagnosis not present

## 2021-02-17 DIAGNOSIS — M25561 Pain in right knee: Secondary | ICD-10-CM | POA: Diagnosis not present

## 2021-02-17 DIAGNOSIS — G90521 Complex regional pain syndrome I of right lower limb: Secondary | ICD-10-CM | POA: Insufficient documentation

## 2021-02-17 DIAGNOSIS — M79604 Pain in right leg: Secondary | ICD-10-CM | POA: Insufficient documentation

## 2021-02-17 DIAGNOSIS — G8929 Other chronic pain: Secondary | ICD-10-CM | POA: Insufficient documentation

## 2021-02-17 DIAGNOSIS — Z96651 Presence of right artificial knee joint: Secondary | ICD-10-CM | POA: Diagnosis not present

## 2021-02-17 NOTE — Progress Notes (Signed)
Safety precautions to be maintained throughout the outpatient stay will include: orient to surroundings, keep bed in low position, maintain call bell within reach at all times, provide assistance with transfer out of bed and ambulation.  

## 2021-02-17 NOTE — Patient Instructions (Signed)
______________________________________________________________________  Preparing for Procedure with Sedation  NOTICE: Due to recent regulatory changes, starting on September 22, 2020, procedures requiring intravenous (IV) sedation will no longer be performed at the [facility].  These types of procedures are required to be performed at [facility] ambulatory surgery facility.  We are very sorry for the inconvenience.  Procedure appointments are limited to planned procedures: No Prescription Refills. No disability issues will be discussed. No medication changes will be discussed.  Instructions: Oral Intake: Do not eat or drink anything for at least 8 hours prior to your procedure. (Exception: Blood Pressure Medication. See below.) Transportation: A driver is required. You may not drive yourself after the procedure. Blood Pressure Medicine: Do not forget to take your blood pressure medicine with a sip of water the morning of the procedure. If your Diastolic (lower reading) is above 100 mmHg, elective cases will be cancelled/rescheduled. Blood thinners: These will need to be stopped for procedures. Notify our staff if you are taking any blood thinners. Depending on which one you take, there will be specific instructions on how and when to stop it. Diabetics on insulin: Notify the staff so that you can be scheduled 1st case in the morning. If your diabetes requires high dose insulin, take only  of your normal insulin dose the morning of the procedure and notify the staff that you have done so. Preventing infections: Shower with an antibacterial soap the morning of your procedure. Build-up your immune system: Take 1000 mg of Vitamin C with every meal (3 times a day) the day prior to your procedure. Antibiotics: Inform the staff if you have a condition or reason that requires you to take antibiotics before dental procedures. Pregnancy: If you are pregnant, call and cancel the procedure. Sickness: If  you have a cold, fever, or any active infections, call and cancel the procedure. Arrival: You must be in the facility at least 30 minutes prior to your scheduled procedure. Children: Do not bring children with you. Dress appropriately: Bring dark clothing that you would not mind if they get stained. Valuables: Do not bring any jewelry or valuables.  Reasons to call and reschedule or cancel your procedure: (Following these recommendations will minimize the risk of a serious complication.) Surgeries: Avoid having procedures within 2 weeks of any surgery. (Avoid for 2 weeks before or after any surgery). Flu Shots: Avoid having procedures within 2 weeks of a flu shots. (Avoid for 2 weeks before or after immunizations). Barium: Avoid having a procedure within 7-10 days after having had a radiological study involving the use of radiological contrast. (Myelograms, Barium swallow or enema study). Heart attacks: Avoid any elective procedures or surgeries for the initial 6 months after a "Myocardial Infarction" (Heart Attack). Blood thinners: It is imperative that you stop these medications before procedures. Let us know if you if you take any blood thinner.  Infection: Avoid procedures during or within two weeks of an infection (including chest colds or gastrointestinal problems). Symptoms associated with infections include: Localized redness, fever, chills, night sweats or profuse sweating, burning sensation when voiding, cough, congestion, stuffiness, runny nose, sore throat, diarrhea, nausea, vomiting, cold or Flu symptoms, recent or current infections. It is specially important if the infection is over the area that we intend to treat. Heart and lung problems: Symptoms that may suggest an active cardiopulmonary problem include: cough, chest pain, breathing difficulties or shortness of breath, dizziness, ankle swelling, uncontrolled high or unusually low blood pressure, and/or palpitations. If you are    experiencing any of these symptoms, cancel your procedure and contact your primary care physician for an evaluation.  Remember:  Regular Business hours are:  Monday to Thursday 8:00 AM to 4:00 PM  Provider's Schedule: Lona Six, MD:  Procedure days: Tuesday and Thursday 7:30 AM to 4:00 PM  Bilal Lateef, MD:  Procedure days: Monday and Wednesday 7:30 AM to 4:00 PM ______________________________________________________________________  ____________________________________________________________________________________________  General Risks and Possible Complications  Patient Responsibilities: It is important that you read this as it is part of your informed consent. It is our duty to inform you of the risks and possible complications associated with treatments offered to you. It is your responsibility as a patient to read this and to ask questions about anything that is not clear or that you believe was not covered in this document.  Patient's Rights: You have the right to refuse treatment. You also have the right to change your mind, even after initially having agreed to have the treatment done. However, under this last option, if you wait until the last second to change your mind, you may be charged for the materials used up to that point.  Introduction: Medicine is not an exact science. Everything in Medicine, including the lack of treatment(s), carries the potential for danger, harm, or loss (which is by definition: Risk). In Medicine, a complication is a secondary problem, condition, or disease that can aggravate an already existing one. All treatments carry the risk of possible complications. The fact that a side effects or complications occurs, does not imply that the treatment was conducted incorrectly. It must be clearly understood that these can happen even when everything is done following the highest safety standards.  No treatment: You can choose not to proceed with the  proposed treatment alternative. The "PRO(s)" would include: avoiding the risk of complications associated with the therapy. The "CON(s)" would include: not getting any of the treatment benefits. These benefits fall under one of three categories: diagnostic; therapeutic; and/or palliative. Diagnostic benefits include: getting information which can ultimately lead to improvement of the disease or symptom(s). Therapeutic benefits are those associated with the successful treatment of the disease. Finally, palliative benefits are those related to the decrease of the primary symptoms, without necessarily curing the condition (example: decreasing the pain from a flare-up of a chronic condition, such as incurable terminal cancer).  General Risks and Complications: These are associated to most interventional treatments. They can occur alone, or in combination. They fall under one of the following six (6) categories: no benefit or worsening of symptoms; bleeding; infection; nerve damage; allergic reactions; and/or death. No benefits or worsening of symptoms: In Medicine there are no guarantees, only probabilities. No healthcare provider can ever guarantee that a medical treatment will work, they can only state the probability that it may. Furthermore, there is always the possibility that the condition may worsen, either directly, or indirectly, as a consequence of the treatment. Bleeding: This is more common if the patient is taking a blood thinner, either prescription or over the counter (example: Goody Powders, Fish oil, Aspirin, Garlic, etc.), or if suffering a condition associated with impaired coagulation (example: Hemophilia, cirrhosis of the liver, low platelet counts, etc.). However, even if you do not have one on these, it can still happen. If you have any of these conditions, or take one of these drugs, make sure to notify your treating physician. Infection: This is more common in patients with a compromised  immune system, either due to disease (example:   diabetes, cancer, human immunodeficiency virus [HIV], etc.), or due to medications or treatments (example: therapies used to treat cancer and rheumatological diseases). However, even if you do not have one on these, it can still happen. If you have any of these conditions, or take one of these drugs, make sure to notify your treating physician. Nerve Damage: This is more common when the treatment is an invasive one, but it can also happen with the use of medications, such as those used in the treatment of cancer. The damage can occur to small secondary nerves, or to large primary ones, such as those in the spinal cord and brain. This damage may be temporary or permanent and it may lead to impairments that can range from temporary numbness to permanent paralysis and/or brain death. Allergic Reactions: Any time a substance or material comes in contact with our body, there is the possibility of an allergic reaction. These can range from a mild skin rash (contact dermatitis) to a severe systemic reaction (anaphylactic reaction), which can result in death. Death: In general, any medical intervention can result in death, most of the time due to an unforeseen complication. ____________________________________________________________________________________________  

## 2021-02-18 ENCOUNTER — Telehealth: Payer: Self-pay | Admitting: Neurology

## 2021-02-18 NOTE — Telephone Encounter (Signed)
Gildardo Griffes, RN  Melvenia Beam, MD; Michel Santee faxed the release form last Thursday. I had requested they send [place] any records that refer to botox for dystonia.    ----- Message -----  From: Melvenia Beam, MD  Sent: 02/15/2021   6:53 PM EST  To: Gildardo Griffes, RN, Allegra Lai  Subject: Orofacial dystonia for patient                 She has facial botox approved by Dr. Lacinda Axon [phone]. She has approval for botox into the face for dystonia. But he is 2 hours away an she wants to transfer here. Will call and see if we can get that transferred to [place] and what the diagnosis was so we can inject into her masseters and other facial muscles as needed, will see what Dr. Jonathon Jordan protocol is and get medical records, we could not do it at the same time as botox for migraine (10 day window) and will need to charge an extra procedure code. We will need to see how much they got approved and how much helped her, she signed a medical release form. Romelle Starcher can you send that form to the doctor above or give to [place]? I would go back 5 years.

## 2021-02-23 NOTE — Progress Notes (Deleted)
PROVIDER NOTE: Interpretation of information contained herein should be left to medically-trained personnel. Specific patient instructions are provided elsewhere under "Patient Instructions" section of medical record. This document was created in part using STT-dictation technology, any transcriptional errors that may result from this process are unintentional.  Patient: Kari Green Type: Established DOB: 10/04/1965 MRN: [id number] PCP: [facility] [place]  Service: Procedure DOS: 02/26/2021 Setting: Ambulatory Location: Ambulatory outpatient facility Delivery: Face-to-face Provider: Gaspar Cola, MD Specialty: Interventional Pain Management Specialty designation: 09 Location: Outpatient facility Ref. Prov.: [employer] [place] [facility]    Primary Reason for Visit: Interventional Pain Management Treatment. CC: No chief complaint on file.    Procedure:          Anesthesia, Analgesia, Anxiolysis:  Type: Therapeutic Superolateral, Superomedial, and Inferomedial, Genicular Nerve Radiofrequency Ablation (destruction).            Region: Lateral, Anterior, and Medial aspects of the knee joint, above and below the knee joint proper. Level: Superior and inferior to the knee joint. Laterality: Right  Anesthesia: Local (1-2% Lidocaine)  Anxiolysis: None  Sedation: None  Guidance: Fluoroscopy           Position: Supine   Indications: No diagnosis found. [name] has been dealing with the above chronic pain for longer than three months and has either failed to respond, was unable to tolerate, or simply did not get enough benefit from other more conservative therapies including, but not limited to: 1. Over-the-counter medications 2. Anti-inflammatory medications 3. Muscle relaxants 4. Membrane stabilizers 5. Opioids 6. Physical therapy and/or chiropractic manipulation 7. Modalities (Heat, ice, etc.) 8. Invasive techniques such as nerve blocks. Ms.  Green has attained more than 50% relief of the pain from a series of diagnostic injections conducted in separate occasions.  Pain Score: Pre-procedure:  /10 Post-procedure:  /10    Pre-op H&P Assessment:  Kari Green is a 56 y.o. (year old), female patient, seen today for interventional treatment. She  has a past surgical history that includes Total thyroidectomy (1993); cysto with hydrodistension (01/29/2011); Cystoscopy w/ retrogrades (01/29/2011); Fissurectomy (~ 1988 x2); Refractive surgery (Bilateral, ~ 1999; ~ 2013); Nasal sinus surgery (2013); Laparoscopic lysis of adhesions (02/11/2012); Excision haglund's deformity with achilles tendon repair (Bilateral, 2013-2014); Melanoma excision (Left, 2013); Cystoscopy with stent placement (2006); Appendectomy (~ 1992); Tonsillectomy and adenoidectomy (~ 1989); LAPAROSCOPY ADHESIOLYSIS AND REMOVAL POSSIBLE RIGHT OVARY REMNANT (04/18/2000); Knee arthroscopy (Right, 08/16/2000); Carpal tunnel release (Bilateral, right 07/26/2001;  left ?); DEBRIDEMENT RIGHT THUMB MP JOINT (Right, 04/10/2002); RIGHT THUMB FUSION OF MPJ (Right, 10/03/2002); DECOMPRESSION LEFT ULNAR NERVE, ELBOW (Left, 01/29/2003); RIGHT TOTAL KNEE REVISION ARTHROPLASTY (Right, 03/14/2007); CYSTO/ URETHRAL DILATION/ HYDRODISTENTION/ BLADDER BX (04/10/2003); Laparoscopic assisted vaginal hysterectomy (Feb 1999); LAPAROSCOPY LYSIS ADHESIONS/  BILATERAL SALPINGOOPHORECTOMY/  FULGERATION OF ENDOMETRIOSIS (1998); Anterior cervical decomp/discectomy fusion (03/10/2009); Esophagogastroduodenoscopy (egd) with esophageal dilation (10-26-2000); CYSTO/  BLADDER BX (03/08/2008); transthoracic echocardiogram (09/22/2012); cysto with hydrodistension (N/A, 05/08/2014); laparoscopy (02/11/2012); Transanal hemorrhoidal dearterialization (03/26/2011); Knee arthroscopy with fulkerson slide (Right, 11/29/2000); Laparoscopic cholecystectomy (03/04/2002); Total knee arthroplasty (Right, 09/10/2003; 12/08/2012); Carpometacarpel  [facility]) suspension plasty (Right, 02/10/2015); Ganglion cyst excision (Right, 02/10/2015); NEGATIVE SLEEP STUDY (2006 approx .  per pt); cysto with hydrodistension (N/A, 03/27/2015); Kidney stone surgery; Anterior cervical decomp/discectomy fusion (N/A, 07/16/2015); Video bronchoscopy (Bilateral, 10/20/2015); Shoulder hemi-arthroplasty (Left, 12/04/2015); EUS (N/A, 04/23/2016); Trigger finger release (Left, 07/06/2016); IR FLUORO GUIDE PORT INSERTION RIGHT (11/04/2016); IR US Guide Vasc Access Right (11/04/2016); Shoulder arthroscopy (Left); Augmentation mammaplasty (Bilateral, 1996; 2001, 2016);  Breast surgery (age 39); Cystoscopy with hydrodistension and biopsy (N/A, 06/29/2017); sympathetic nerve ablation (07/05/2017); and Lymph node biopsy (Left, 07/24/2018). Kari Green has a current medication list which includes the following prescription(s): alprazolam, botox, cefdinir, cyanocobalamin, diphenhydramine, doxycycline, estradiol, evzio, fluticasone, gabapentin, hydrochlorothiazide, hydromorphone, hydromorphone, hydroxyzine, ketoconazole, ketorolac, levothyroxine, multivitamin with minerals, NON FORMULARY, onabotulinumtoxina, potassium chloride, tizanidine, topiramate, and vitamin c. Her primarily concern today is the No chief complaint on file.  Initial Vital Signs:  Pulse/HCG Rate:    Temp:   Resp:   BP:   SpO2:    BMI: Estimated body mass index is 22.14 kg/m as calculated from the following:   Height as of 02/17/21: 5\' 3"  (1.6 m).   Weight as of 02/17/21: 125 lb (56.7 kg).  Risk Assessment: Allergies: Reviewed. She is allergic to betadine [povidone-iodine], cobalt, contrast media [iodinated contrast media], latex, nickel, peanuts [peanut oil], red dye, azithromycin, chloraprep one step [chlorhexidine gluconate], corticosteroids, flexeril [cyclobenzaprine], morphine and related, other, percocet [oxycodone-acetaminophen], supartz [sodium hyaluronate (avian)], surgical lubricant, tape, trazodone, yellow dye,  yellow dyes (non-tartrazine), cephalexin, monistat [miconazole], prednisone, sulfa antibiotics, tea, and soap.  Allergy Precautions: None required Coagulopathies: Reviewed. None identified.  Blood-thinner therapy: None at this time Active Infection(s): Reviewed. None identified. Ms. Ascencio is afebrile  Site Confirmation: Ms. Corella was asked to confirm the procedure and laterality before marking the site Procedure checklist: Completed Consent: Before the procedure and under the influence of no sedative(s), amnesic(s), or anxiolytics, the patient was informed of the treatment options, risks and possible complications. To fulfill our ethical and legal obligations, as recommended by the American Medical Association's Code of Ethics, I have informed the patient of my clinical impression; the nature and purpose of the treatment or procedure; the risks, benefits, and possible complications of the intervention; the alternatives, including doing nothing; the risk(s) and benefit(s) of the alternative treatment(s) or procedure(s); and the risk(s) and benefit(s) of doing nothing. The patient was provided information about the general risks and possible complications associated with the procedure. These may include, but are not limited to: failure to achieve desired goals, infection, bleeding, organ or nerve damage, allergic reactions, paralysis, and death. In addition, the patient was informed of those risks and complications associated to the procedure, such as failure to decrease pain; infection; bleeding; organ or nerve damage with subsequent damage to sensory, motor, and/or autonomic systems, resulting in permanent pain, numbness, and/or weakness of one or several areas of the body; allergic reactions; (i.e.: anaphylactic reaction); and/or death. Furthermore, the patient was informed of those risks and complications associated with the medications. These include, but are not limited to: allergic  reactions (i.e.: anaphylactic or anaphylactoid reaction(s)); adrenal axis suppression; blood sugar elevation that in diabetics may result in ketoacidosis or comma; water retention that in patients with history of congestive heart failure may result in shortness of breath, pulmonary edema, and decompensation with resultant heart failure; weight gain; swelling or edema; medication-induced neural toxicity; particulate matter embolism and blood vessel occlusion with resultant organ, and/or nervous system infarction; and/or aseptic necrosis of one or more joints. Finally, the patient was informed that Medicine is not an exact science; therefore, there is also the possibility of unforeseen or unpredictable risks and/or possible complications that may result in a catastrophic outcome. The patient indicated having understood very clearly. We have given the patient no guarantees and we have made no promises. Enough time was given to the patient to ask questions, all of which were answered to the patient's satisfaction. Ms.  Koenig has indicated that she wanted to continue with the procedure. Attestation: I, the ordering provider, attest that I have discussed with the patient the benefits, risks, side-effects, alternatives, likelihood of achieving goals, and potential problems during recovery for the procedure that I have provided informed consent. Date   Time: {CHL [facility]-PAIN TIME CHOICES:21018001}  Pre-Procedure Preparation:  Monitoring: As per clinic protocol. Respiration, ETCO2, SpO2, BP, heart rate and rhythm monitor placed and checked for adequate function Safety Precautions: Patient was assessed for positional comfort and pressure points before starting the procedure. Time-out: I initiated and conducted the "Time-out" before starting the procedure, as per protocol. The patient was asked to participate by confirming the accuracy of the "Time Out" information. Verification of the correct person, site, and  procedure were performed and confirmed by me, the nursing staff, and the patient. "Time-out" conducted as per Joint Commission's Universal Protocol (UP.01.01.01). Time:    Description of Procedure:          Target Area: For Genicular Nerve radiofrequency ablation (destruction), the targets are: the superolateral genicular nerve, located in the lateral distal portion of the femoral shaft as it curves to form the lateral epicondyle, in the region of the distal femoral metaphysis; the superomedial genicular nerve, located in the medial distal portion of the femoral shaft as it curves to form the medial epicondyle; and the inferomedial genicular nerve, located in the medial, proximal portion of the tibial shaft, as it curves to form the medial epicondyle, in the region of the proximal tibial metaphysis. Approach: Anterior, ipsilateral approach. Area Prepped: Entire knee area, from mid-thigh to mid-shin, lateral, anterior, and medial aspects. DuraPrep (Iodine Povacrylex [0.7% available iodine] and Isopropyl Alcohol, 74% w/w) Safety Precautions: Aspiration looking for blood return was conducted prior to all injections. At no point did we inject any substances, as a needle was being advanced. No attempts were made at seeking any paresthesias. Safe injection practices and needle disposal techniques used. Medications properly checked for expiration dates. SDV (single dose vial) medications used. Description of the Procedure: Protocol guidelines were followed. The patient was placed in position over the procedure table. The target area was identified and the area prepped in the usual manner. The skin and muscle were infiltrated with local anesthetic. Appropriate amount of time allowed to pass for local anesthetics to take effect. Radiofrequency needles were introduced to the target area using fluoroscopic guidance. Using the NeuroTherm NT1100 Radiofrequency Generator, sensory stimulation using 50 Hz was used to  locate & identify the nerve, making sure that the needle was positioned such that there was no sensory stimulation below 0.3 V or above 0.7 V. Stimulation using 2 Hz was used to evaluate the motor component. Care was taken not to lesion any nerves that demonstrated motor stimulation of the lower extremities at an output of less than 2.5 times that of the sensory threshold, or a maximum of 2.0 V. Once satisfactory placement of the needles was achieved, the numbing solution was slowly injected after negative aspiration. After waiting for at least 2 minutes, the ablation was performed at 80 degrees C for 60 seconds, using regular Radiofrequency settings. Once the procedure was completed, the needles were then removed and the area cleansed, making sure to leave some of the prepping solution back to take advantage of its long term bactericidal properties. Intra-operative Compliance: Compliant      There were no vitals filed for this visit.  Start Time:   hrs. End Time:   hrs. Materials & Medications:  Needle(s) Type: Teflon-coated, curved tip, Radiofrequency needle(s) Gauge: 22G Length: 10cm Medication(s): Please see orders for medications and dosing details.  Imaging Guidance (Non-Spinal):          Type of Imaging Technique: Fluoroscopy Guidance (Non-Spinal) Indication(s): Assistance in needle guidance and placement for procedures requiring needle placement in or near specific anatomical locations not easily accessible without such assistance. Exposure Time: Please see nurses notes. Contrast: Before injecting any contrast, we confirmed that the patient did not have an allergy to iodine, shellfish, or radiological contrast. Once satisfactory needle placement was completed at the desired level, radiological contrast was injected. Contrast injected under live fluoroscopy. No contrast complications. See chart for type and volume of contrast used. Fluoroscopic Guidance: I was personally present during  the use of fluoroscopy. "Tunnel Vision Technique" used to obtain the best possible view of the target area. Parallax error corrected before commencing the procedure. "Direction-depth-direction" technique used to introduce the needle under continuous pulsed fluoroscopy. Once target was reached, antero-posterior, oblique, and lateral fluoroscopic projection used confirm needle placement in all planes. Images permanently stored in EMR. Interpretation: I personally interpreted the imaging intraoperatively. Adequate needle placement confirmed in multiple planes. Appropriate spread of contrast into desired area was observed. No evidence of afferent or efferent intravascular uptake. Permanent images saved into the patient's record.  Antibiotic Prophylaxis:   Anti-infectives (From admission, onward)    None      Indication(s): None identified  Post-operative Assessment:  Post-procedure Vital Signs:  Pulse/HCG Rate:    Temp:   Resp:   BP:   SpO2:    EBL: None  Complications: No immediate post-treatment complications observed by team, or reported by patient.  Note: The patient tolerated the entire procedure well. A repeat set of vitals were taken after the procedure and the patient was kept under observation following institutional policy, for this type of procedure. Post-procedural neurological assessment was performed, showing return to baseline, prior to discharge. The patient was provided with post-procedure discharge instructions, including a section on how to identify potential problems. Should any problems arise concerning this procedure, the patient was given instructions to immediately contact us, at any time, without hesitation. In any case, we plan to contact the patient by telephone for a follow-up status report regarding this interventional procedure.  Comments:  No additional relevant information.  Plan of Care  Orders:  No orders of the defined types were placed in this  encounter.  Chronic Opioid Analgesic:  No opioid analgesics prescribed by our practice.  The last time that we checked, the patient was getting her pain medications from the [facility].   Medications ordered for procedure: No orders of the defined types were placed in this encounter.  Medications administered: Nikki Glanzer. Zellmer had no medications administered during this visit.  See the medical record for exact dosing, route, and time of administration.  Follow-up plan:   No follow-ups on file.       Interventional Therapies  Risk   Complexity Considerations:   Latex allergy  Steroid allergy  The patient gets her pain medications at the [facility]   Planned   Pending:      Under consideration:   Diagnostic bilateral SI joint injection  Diagnostic bilateral lumbar facet block  Diagnostic bilateral IA hip joint injection    Completed:   Palliative right genicular nerves RFA x4 (03/18/2020) (100/100/100/90)  Diagnostic right L3, L4 lumbar sympathetic Blk x1 (04/08/2020) (100/100/100 x1 week/50)  Palliative right L3, L4 lumbar  sympathetic RFA x4 (06/03/2020) (100/100/90/90)    Therapeutic   Palliative (PRN) options:   Palliative right genicular nerve RFA  Palliative right L3 & L4 lumbar sympathetic RFA      Recent Visits Date Type Provider Dept  02/17/21 Office Visit Milinda Pointer, MD [facility]  Showing recent visits within past 90 days and meeting all other requirements Future Appointments Date Type Provider Dept  02/26/21 Appointment Milinda Pointer, MD [facility]  Showing future appointments within next 90 days and meeting all other requirements  Disposition: Discharge home  Discharge (Date   Time): 02/26/2021;   hrs.   Primary Care Physician: [facility] [place] Location: [facility] Note by: Gaspar Cola, MD Date: 02/26/2021; Time: 12:53 PM  Disclaimer:  Medicine is  not an Chief Strategy Officer. The only guarantee in medicine is that nothing is guaranteed. It is important to note that the decision to proceed with this intervention was based on the information collected from the patient. The Data and conclusions were drawn from the patient's questionnaire, the interview, and the physical examination. Because the information was provided in large part by the patient, it cannot be guaranteed that it has not been purposely or unconsciously manipulated. Every effort has been made to obtain as much relevant data as possible for this evaluation. It is important to note that the conclusions that lead to this procedure are derived in large part from the available data. Always take into account that the treatment will also be dependent on availability of resources and existing treatment guidelines, considered by other Pain Management Practitioners as being common knowledge and practice, at the time of the intervention. For Medico-Legal purposes, it is also important to point out that variation in procedural techniques and pharmacological choices are the acceptable norm. The indications, contraindications, technique, and results of the above procedure should only be interpreted and judged by a Board-Certified Interventional Pain Specialist with extensive familiarity and expertise in the same exact procedure and technique.

## 2021-02-24 ENCOUNTER — Other Ambulatory Visit: Payer: Self-pay

## 2021-02-24 ENCOUNTER — Non-Acute Institutional Stay (HOSPITAL_COMMUNITY)
Admission: RE | Admit: 2021-02-24 | Discharge: 2021-02-24 | Disposition: A | Discharge status: Home / Self Care | Payer: BC Managed Care – PPO | Source: Ambulatory Visit | Attending: Internal Medicine | Admitting: Internal Medicine

## 2021-02-24 DIAGNOSIS — R079 Chest pain, unspecified: Secondary | ICD-10-CM | POA: Insufficient documentation

## 2021-02-24 DIAGNOSIS — Z452 Encounter for adjustment and management of vascular access device: Secondary | ICD-10-CM | POA: Insufficient documentation

## 2021-02-24 DIAGNOSIS — R002 Palpitations: Secondary | ICD-10-CM | POA: Insufficient documentation

## 2021-02-24 LAB — C-REACTIVE PROTEIN: CRP: 0.5 mg/dL (ref <1.0)

## 2021-02-24 LAB — CBC WITH DIFFERENTIAL/PLATELET
Abs Immature Granulocytes: 0.01 10*3/uL (ref 0.00–0.07)
Basophils Absolute: 0 10*3/uL (ref 0.0–0.1)
Basophils Relative: 0 %
Eosinophils Absolute: 0.2 10*3/uL (ref 0.0–0.5)
Eosinophils Relative: 3 %
HCT: 35.1 % — ABNORMAL LOW (ref 36.0–46.0)
Hemoglobin: 11.3 g/dL — ABNORMAL LOW (ref 12.0–15.0)
Immature Granulocytes: 0 %
Lymphocytes Relative: 44 %
Lymphs Abs: 2.4 10*3/uL (ref 0.7–4.0)
MCH: 28.2 pg (ref 26.0–34.0)
MCHC: 32.2 g/dL (ref 30.0–36.0)
MCV: 87.5 fL (ref 80.0–100.0)
Monocytes Absolute: 0.4 10*3/uL (ref 0.1–1.0)
Monocytes Relative: 7 %
Neutro Abs: 2.5 10*3/uL (ref 1.7–7.7)
Neutrophils Relative %: 46 %
Platelets: 224 10*3/uL (ref 150–400)
RBC: 4.01 MIL/uL (ref 3.87–5.11)
RDW: 13.6 % (ref 11.5–15.5)
WBC: 5.5 10*3/uL (ref 4.0–10.5)
nRBC: 0 % (ref 0.0–0.2)

## 2021-02-24 LAB — AMYLASE: Amylase: 62 U/L (ref 28–100)

## 2021-02-24 LAB — T4, FREE: Free T4: 1.2 ng/dL — ABNORMAL HIGH (ref 0.61–1.12)

## 2021-02-24 LAB — SEDIMENTATION RATE: Sed Rate: 5 mm/hr (ref 0–22)

## 2021-02-24 LAB — LIPASE, BLOOD: Lipase: 34 U/L (ref 11–51)

## 2021-02-24 LAB — TSH: TSH: 0.127 u[IU]/mL — ABNORMAL LOW (ref 0.350–4.500)

## 2021-02-24 MED ORDER — SODIUM CHLORIDE 0.9% FLUSH
10.0000 mL | INTRAVENOUS | Status: AC | PRN
  Administered 2021-02-24: 10 mL

## 2021-02-24 MED ORDER — HEPARIN SOD (PORK) LOCK FLUSH 100 UNIT/ML IV SOLN
500.0000 [IU] | INTRAVENOUS | Status: AC | PRN
  Administered 2021-02-24: 500 [IU]
  Filled 2021-02-24: qty 5

## 2021-02-24 NOTE — Progress Notes (Signed)
°  Provider: Milinda Pointer MD/ Gwyndolyn Kaufman MD     Procedure: Port-a-cath flush + lab draw     Note: Patient's PAC accessed, flushed with 10 cc 0.9% sodium chloride and heparin. Brisk blood return noted, however port required multiple flushes before blood return obtained. Labs collected ( cbc w diff, sed rate, CRP, lipase, amylase, thyroid panel). Prior to access, reviewed allergy to chlorhexidine and betadine with pt, pt states she does not have a true reaction to chloraprep  but rather gets a rash when she uses chlorhexidine wash prior to procedures, pt states her port has been cleansed with chloraprep in the past without incident.  PAC de-accessed and site covered with gauze and tegaderm. Patient tolerated well. Alert, oriented and ambulatory at discharge.

## 2021-02-25 LAB — T3, FREE: T3, Free: 2.7 pg/mL (ref 2.0–4.4)

## 2021-02-26 ENCOUNTER — Ambulatory Visit: Payer: BC Managed Care – PPO | Admitting: Pain Medicine

## 2021-03-03 NOTE — Telephone Encounter (Signed)
I will work on Warehouse manager with insurance to see if we can get this approved.  Patient's next Botox for migraine appointment is 3/17 with Megan. Today I received (2) 100 unit vials of Botox from [facility].

## 2021-03-17 ENCOUNTER — Encounter: Payer: Self-pay | Admitting: Pain Medicine

## 2021-03-17 ENCOUNTER — Ambulatory Visit
Admission: RE | Admit: 2021-03-17 | Discharge: 2021-03-17 | Disposition: A | Discharge status: Home / Self Care | Payer: BC Managed Care – PPO | Source: Ambulatory Visit | Attending: Pain Medicine | Admitting: Pain Medicine

## 2021-03-17 ENCOUNTER — Other Ambulatory Visit: Payer: Self-pay

## 2021-03-17 ENCOUNTER — Ambulatory Visit (HOSPITAL_BASED_OUTPATIENT_CLINIC_OR_DEPARTMENT_OTHER): Payer: BC Managed Care – PPO | Admitting: Pain Medicine

## 2021-03-17 VITALS — BP 98/70 | HR 67 | Temp 97.3°F | Resp 16 | Ht 63.0 in | Wt 122.5 lb

## 2021-03-17 DIAGNOSIS — G8929 Other chronic pain: Secondary | ICD-10-CM | POA: Diagnosis not present

## 2021-03-17 DIAGNOSIS — Z91040 Latex allergy status: Secondary | ICD-10-CM | POA: Diagnosis not present

## 2021-03-17 DIAGNOSIS — Z87898 Personal history of other specified conditions: Secondary | ICD-10-CM | POA: Insufficient documentation

## 2021-03-17 DIAGNOSIS — Z96651 Presence of right artificial knee joint: Secondary | ICD-10-CM | POA: Diagnosis not present

## 2021-03-17 DIAGNOSIS — I9788 Other intraoperative complications of the circulatory system, not elsewhere classified: Secondary | ICD-10-CM | POA: Insufficient documentation

## 2021-03-17 DIAGNOSIS — Z95828 Presence of other vascular implants and grafts: Secondary | ICD-10-CM

## 2021-03-17 DIAGNOSIS — I9589 Other hypotension: Secondary | ICD-10-CM

## 2021-03-17 DIAGNOSIS — R001 Bradycardia, unspecified: Secondary | ICD-10-CM | POA: Insufficient documentation

## 2021-03-17 DIAGNOSIS — G90521 Complex regional pain syndrome I of right lower limb: Secondary | ICD-10-CM

## 2021-03-17 DIAGNOSIS — Z91041 Radiographic dye allergy status: Secondary | ICD-10-CM | POA: Diagnosis not present

## 2021-03-17 DIAGNOSIS — M79604 Pain in right leg: Secondary | ICD-10-CM | POA: Diagnosis not present

## 2021-03-17 DIAGNOSIS — M25561 Pain in right knee: Secondary | ICD-10-CM | POA: Diagnosis not present

## 2021-03-17 DIAGNOSIS — F419 Anxiety disorder, unspecified: Secondary | ICD-10-CM

## 2021-03-17 DIAGNOSIS — Z889 Allergy status to unspecified drugs, medicaments and biological substances status: Secondary | ICD-10-CM

## 2021-03-17 MED ORDER — DIPHENHYDRAMINE HCL 50 MG/ML IJ SOLN
12.5000 mg | Freq: Once | INTRAMUSCULAR | Status: AC
  Administered 2021-03-17: 08:00:00 12.5 mg via INTRAVENOUS

## 2021-03-17 MED ORDER — LIDOCAINE HCL (PF) 2 % IJ SOLN
INTRAMUSCULAR | Status: AC
  Filled 2021-03-17: qty 5

## 2021-03-17 MED ORDER — FENTANYL CITRATE (PF) 100 MCG/2ML IJ SOLN
25.0000 ug | INTRAMUSCULAR | Status: DC | PRN
  Administered 2021-03-17: 09:00:00 50 ug via INTRAVENOUS

## 2021-03-17 MED ORDER — ONDANSETRON HCL 4 MG/2ML IJ SOLN
INTRAMUSCULAR | Status: AC
  Filled 2021-03-17: qty 2

## 2021-03-17 MED ORDER — FENTANYL CITRATE (PF) 100 MCG/2ML IJ SOLN
INTRAMUSCULAR | Status: AC
  Filled 2021-03-17: qty 2

## 2021-03-17 MED ORDER — HEPARIN SOD (PORK) LOCK FLUSH 100 UNIT/ML IV SOLN
500.0000 [IU] | Freq: Once | INTRAVENOUS | Status: DC
  Filled 2021-03-17: qty 5

## 2021-03-17 MED ORDER — LIDOCAINE HCL (PF) 2 % IJ SOLN
INTRAMUSCULAR | Status: AC
  Filled 2021-03-17: qty 10

## 2021-03-17 MED ORDER — KETOROLAC TROMETHAMINE 60 MG/2ML IM SOLN
INTRAMUSCULAR | Status: AC
  Filled 2021-03-17: qty 2

## 2021-03-17 MED ORDER — HEPARIN SOD (PORK) LOCK FLUSH 10 UNIT/ML IV SOLN
5.0000 mL | Freq: Once | INTRAVENOUS | Status: DC
  Filled 2021-03-17: qty 5

## 2021-03-17 MED ORDER — LACTATED RINGERS IV SOLN
1000.0000 mL | Freq: Once | INTRAVENOUS | Status: AC
  Administered 2021-03-17: 10:00:00 1000 mL via INTRAVENOUS

## 2021-03-17 MED ORDER — CEFAZOLIN SODIUM 1 G IJ SOLR
INTRAMUSCULAR | Status: AC
  Filled 2021-03-17: qty 10

## 2021-03-17 MED ORDER — ORPHENADRINE CITRATE 30 MG/ML IJ SOLN
INTRAMUSCULAR | Status: AC
  Filled 2021-03-17: qty 2

## 2021-03-17 MED ORDER — MIDAZOLAM HCL 5 MG/5ML IJ SOLN
0.5000 mg | Freq: Once | INTRAMUSCULAR | Status: AC
  Administered 2021-03-17: 09:00:00 1 mg via INTRAVENOUS

## 2021-03-17 MED ORDER — MIDAZOLAM HCL 5 MG/5ML IJ SOLN
INTRAMUSCULAR | Status: AC
  Filled 2021-03-17: qty 5

## 2021-03-17 MED ORDER — ONDANSETRON HCL 4 MG/2ML IJ SOLN
4.0000 mg | Freq: Once | INTRAMUSCULAR | Status: AC
  Administered 2021-03-17: 08:00:00 4 mg via INTRAVENOUS

## 2021-03-17 MED ORDER — LIDOCAINE HCL 2 % IJ SOLN
20.0000 mL | Freq: Once | INTRAMUSCULAR | Status: AC
  Administered 2021-03-17: 08:00:00 100 mg

## 2021-03-17 MED ORDER — ROPIVACAINE HCL 2 MG/ML IJ SOLN
INTRAMUSCULAR | Status: AC
  Filled 2021-03-17: qty 20

## 2021-03-17 MED ORDER — ROPIVACAINE HCL 2 MG/ML IJ SOLN
9.0000 mL | Freq: Once | INTRAMUSCULAR | Status: AC
  Administered 2021-03-17: 08:00:00 9 mL via PERINEURAL

## 2021-03-17 MED ORDER — GLYCOPYRROLATE 0.2 MG/ML IJ SOLN
0.2000 mg | Freq: Once | INTRAMUSCULAR | Status: AC
  Administered 2021-03-17: 09:00:00 0.2 mg via INTRAVENOUS

## 2021-03-17 MED ORDER — CEFAZOLIN SODIUM-DEXTROSE 1-4 GM/50ML-% IV SOLN
1.0000 g | Freq: Once | INTRAVENOUS | Status: AC
  Administered 2021-03-17: 08:00:00 1 g via INTRAVENOUS

## 2021-03-17 MED ORDER — DIPHENHYDRAMINE HCL 50 MG/ML IJ SOLN
INTRAMUSCULAR | Status: AC
  Filled 2021-03-17: qty 1

## 2021-03-17 NOTE — Progress Notes (Signed)
PROVIDER NOTE: Interpretation of information contained herein should be left to medically-trained personnel. Specific patient instructions are provided elsewhere under "Patient Instructions" section of medical record. This document was created in part using STT-dictation technology, any transcriptional errors that may result from this process are unintentional.  Patient: Kari Green Type: Established DOB: 1965-03-30 MRN: [id number] PCP: [facility] [place]  Service: Procedure DOS: 03/17/2021 Setting: Ambulatory Location: Ambulatory outpatient facility Delivery: Face-to-face Provider: Gaspar Cola, MD Specialty: Interventional Pain Management Specialty designation: 09 Location: Outpatient facility Ref. Prov.: [employer], [employer] [facility]    Primary Reason for Visit: Interventional Pain Management Treatment. CC: Knee Pain (right)    Procedure:          Anesthesia, Analgesia, Anxiolysis:  Type: Therapeutic Superolateral, Superomedial, and Inferomedial, Genicular Nerve Radiofrequency Ablation (destruction).   #5  Region: Lateral, Anterior, and Medial aspects of the knee joint, above and below the knee joint proper. Level: Superior and inferior to the knee joint. Laterality: Right  Anesthesia: Local (1-2% Lidocaine)  Anxiolysis: IV (Versed 1 mg) Sedation: Minimal (fentanyl 1 mL) (50 mcg) Guidance: Fluoroscopy           Position: Supine   Indications: 1. Chronic lower extremity pain (1ry area of Pain) (Right)   2. Chronic knee pain (2ry area of Pain) (Right)   3. 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 CRPS (complex regional pain syndrome) type I of lower limb (Right)    History of arthroplasty of knee (Right)    History of allergy to latex    History of allergy to IVP dye    History of postoperative nausea and vomiting    Port-A-Cath in place    History of multiple allergies    Multiple drug allergies (codeine, Lidoderm a  DC if, sulfa, latex, IVP dye, Keflex, Augmentin)    Kari Green has been dealing with the above chronic pain for longer than three months and has either failed to respond, was unable to tolerate, or simply did not get enough benefit from other more conservative therapies including, but not limited to: 1. Over-the-counter medications 2. Anti-inflammatory medications 3. Muscle relaxants 4. Membrane stabilizers 5. Opioids 6. Physical therapy and/or chiropractic manipulation 7. Modalities (Heat, ice, etc.) 8. Invasive techniques such as nerve blocks. Kari Green has attained more than 50% relief of the pain from a series of diagnostic injections conducted in separate occasions.  Pain Score: Pre-procedure: 10-Worst pain ever/10 Post-procedure: 0-No pain/10    Note: The patient seems to be sedated to start with, probably from medications that she uses at home.  We were able to do the procedure with minimal sedation.  Pre-op H&P Assessment:  Kari Green is a 56 y.o. (year old), female patient, seen today for interventional treatment. She  has a past surgical history that includes Total thyroidectomy (1993); cysto with hydrodistension (01/29/2011); Cystoscopy w/ retrogrades (01/29/2011); Fissurectomy (~ 1988 x2); Refractive surgery (Bilateral, ~ 1999; ~ 2013); Nasal sinus surgery (2013); Laparoscopic lysis of adhesions (02/11/2012); Excision haglund's deformity with achilles tendon repair (Bilateral, 2013-2014); Melanoma excision (Left, 2013); Cystoscopy with stent placement (2006); Appendectomy (~ 1992); Tonsillectomy and adenoidectomy (~ 1989); LAPAROSCOPY ADHESIOLYSIS AND REMOVAL POSSIBLE RIGHT OVARY REMNANT (04/18/2000); Knee arthroscopy (Right, 08/16/2000); Carpal tunnel release (Bilateral, right 07/26/2001;  left ?); DEBRIDEMENT RIGHT THUMB MP JOINT (Right, 04/10/2002); RIGHT THUMB FUSION OF MPJ (Right, 10/03/2002); DECOMPRESSION LEFT ULNAR NERVE, ELBOW (Left, 01/29/2003); RIGHT TOTAL KNEE  REVISION ARTHROPLASTY (Right, 03/14/2007); CYSTO/ URETHRAL  DILATION/ HYDRODISTENTION/ BLADDER BX (04/10/2003); Laparoscopic assisted vaginal hysterectomy (Feb 1999); LAPAROSCOPY LYSIS ADHESIONS/  BILATERAL SALPINGOOPHORECTOMY/  FULGERATION OF ENDOMETRIOSIS (1998); Anterior cervical decomp/discectomy fusion (03/10/2009); Esophagogastroduodenoscopy (egd) with esophageal dilation (10-26-2000); CYSTO/  BLADDER BX (03/08/2008); transthoracic echocardiogram (09/22/2012); cysto with hydrodistension (N/A, 05/08/2014); laparoscopy (02/11/2012); Transanal hemorrhoidal dearterialization (03/26/2011); Knee arthroscopy with fulkerson slide (Right, 11/29/2000); Laparoscopic cholecystectomy (03/04/2002); Total knee arthroplasty (Right, 09/10/2003; 12/08/2012); Carpometacarpel [facility]) suspension plasty (Right, 02/10/2015); Ganglion cyst excision (Right, 02/10/2015); NEGATIVE SLEEP STUDY (2006 approx .  per pt); cysto with hydrodistension (N/A, 03/27/2015); Kidney stone surgery; Anterior cervical decomp/discectomy fusion (N/A, 07/16/2015); Video bronchoscopy (Bilateral, 10/20/2015); Shoulder hemi-arthroplasty (Left, 12/04/2015); EUS (N/A, 04/23/2016); Trigger finger release (Left, 07/06/2016); IR FLUORO GUIDE PORT INSERTION RIGHT (11/04/2016); IR US Guide Vasc Access Right (11/04/2016); Shoulder arthroscopy (Left); Augmentation mammaplasty (Bilateral, 1996; 2001, 2016); Breast surgery (age 67); Cystoscopy with hydrodistension and biopsy (N/A, 06/29/2017); sympathetic nerve ablation (07/05/2017); and Lymph node biopsy (Left, 07/24/2018). Kari Green has a current medication list which includes the following prescription(s): alprazolam, botox, cefdinir, cyanocobalamin, diphenhydramine, doxycycline, estradiol, evzio, fluticasone, gabapentin, hydrochlorothiazide, hydromorphone, hydroxyzine, ketoconazole, ketorolac, levothyroxine, multivitamin with minerals, NON FORMULARY, onabotulinumtoxina, potassium chloride, tizanidine, topiramate, vitamin c, and  hydromorphone, and the following Facility-Administered Medications: fentanyl and heparin lock flush. Her primarily concern today is the Knee Pain (right)  Initial Vital Signs:  Pulse/HCG Rate: 67ECG Heart Rate: (!) 50 Temp: (!) 97.3 F (36.3 C) Resp: 16 BP: 122/75 SpO2: 99 %  BMI: Estimated body mass index is 21.7 kg/m as calculated from the following:   Height as of this encounter: 5\' 3"  (1.6 m).   Weight as of this encounter: 122 lb 8 oz (55.6 kg).  Risk Assessment: Allergies: Reviewed. She is allergic to betadine [povidone-iodine], cobalt, contrast media [iodinated contrast media], latex, nickel, peanuts [peanut oil], red dye, azithromycin, chloraprep one step [chlorhexidine gluconate], corticosteroids, flexeril [cyclobenzaprine], morphine and related, other, percocet [oxycodone-acetaminophen], supartz [sodium hyaluronate (avian)], surgical lubricant, tape, trazodone, yellow dye, yellow dyes (non-tartrazine), cephalexin, monistat [miconazole], prednisone, sulfa antibiotics, tea, and soap.  Allergy Precautions: None required Coagulopathies: Reviewed. None identified.  Blood-thinner therapy: None at this time Active Infection(s): Reviewed. None identified. Ms. Pautler is afebrile  Site Confirmation: Ms. Eppinger was asked to confirm the procedure and laterality before marking the site Procedure checklist: Completed Consent: Before the procedure and under the influence of no sedative(s), amnesic(s), or anxiolytics, the patient was informed of the treatment options, risks and possible complications. To fulfill our ethical and legal obligations, as recommended by the American Medical Association's Code of Ethics, I have informed the patient of my clinical impression; the nature and purpose of the treatment or procedure; the risks, benefits, and possible complications of the intervention; the alternatives, including doing nothing; the risk(s) and benefit(s) of the alternative treatment(s)  or procedure(s); and the risk(s) and benefit(s) of doing nothing. The patient was provided information about the general risks and possible complications associated with the procedure. These may include, but are not limited to: failure to achieve desired goals, infection, bleeding, organ or nerve damage, allergic reactions, paralysis, and death. In addition, the patient was informed of those risks and complications associated to the procedure, such as failure to decrease pain; infection; bleeding; organ or nerve damage with subsequent damage to sensory, motor, and/or autonomic systems, resulting in permanent pain, numbness, and/or weakness of one or several areas of the body; allergic reactions; (i.e.: anaphylactic reaction); and/or death. Furthermore, the patient was informed of those risks and complications associated with the medications. These  include, but are not limited to: allergic reactions (i.e.: anaphylactic or anaphylactoid reaction(s)); adrenal axis suppression; blood sugar elevation that in diabetics may result in ketoacidosis or comma; water retention that in patients with history of congestive heart failure may result in shortness of breath, pulmonary edema, and decompensation with resultant heart failure; weight gain; swelling or edema; medication-induced neural toxicity; particulate matter embolism and blood vessel occlusion with resultant organ, and/or nervous system infarction; and/or aseptic necrosis of one or more joints. Finally, the patient was informed that Medicine is not an exact science; therefore, there is also the possibility of unforeseen or unpredictable risks and/or possible complications that may result in a catastrophic outcome. The patient indicated having understood very clearly. We have given the patient no guarantees and we have made no promises. Enough time was given to the patient to ask questions, all of which were answered to the patient's satisfaction. Ms. Luevano  has indicated that she wanted to continue with the procedure. Attestation: I, the ordering provider, attest that I have discussed with the patient the benefits, risks, side-effects, alternatives, likelihood of achieving goals, and potential problems during recovery for the procedure that I have provided informed consent. Date   Time: 03/17/2021  8:00 AM  Pre-Procedure Preparation:  Monitoring: As per clinic protocol. Respiration, ETCO2, SpO2, BP, heart rate and rhythm monitor placed and checked for adequate function Safety Precautions: Patient was assessed for positional comfort and pressure points before starting the procedure. Time-out: I initiated and conducted the "Time-out" before starting the procedure, as per protocol. The patient was asked to participate by confirming the accuracy of the "Time Out" information. Verification of the correct person, site, and procedure were performed and confirmed by me, the nursing staff, and the patient. "Time-out" conducted as per Joint Commission's Universal Protocol (UP.01.01.01). Time: 3762  Description of Procedure:          Target Area: For Genicular Nerve radiofrequency ablation (destruction), the targets are: the superolateral genicular nerve, located in the lateral distal portion of the femoral shaft as it curves to form the lateral epicondyle, in the region of the distal femoral metaphysis; the superomedial genicular nerve, located in the medial distal portion of the femoral shaft as it curves to form the medial epicondyle; and the inferomedial genicular nerve, located in the medial, proximal portion of the tibial shaft, as it curves to form the medial epicondyle, in the region of the proximal tibial metaphysis. Approach: Anterior, ipsilateral approach. Area Prepped: Entire knee area, from mid-thigh to mid-shin, lateral, anterior, and medial aspects. DuraPrep (Iodine Povacrylex [0.7% available iodine] and Isopropyl Alcohol, 74% w/w) Safety Precautions:  Aspiration looking for blood return was conducted prior to all injections. At no point did we inject any substances, as a needle was being advanced. No attempts were made at seeking any paresthesias. Safe injection practices and needle disposal techniques used. Medications properly checked for expiration dates. SDV (single dose vial) medications used. Description of the Procedure: Protocol guidelines were followed. The patient was placed in position over the procedure table. The target area was identified and the area prepped in the usual manner. The skin and muscle were infiltrated with local anesthetic. Appropriate amount of time allowed to pass for local anesthetics to take effect. Radiofrequency needles were introduced to the target area using fluoroscopic guidance. Using the NeuroTherm NT1100 Radiofrequency Generator, sensory stimulation using 50 Hz was used to locate & identify the nerve, making sure that the needle was positioned such that there was no sensory stimulation  below 0.3 V or above 0.7 V. Stimulation using 2 Hz was used to evaluate the motor component. Care was taken not to lesion any nerves that demonstrated motor stimulation of the lower extremities at an output of less than 2.5 times that of the sensory threshold, or a maximum of 2.0 V. Once satisfactory placement of the needles was achieved, the numbing solution was slowly injected after negative aspiration. After waiting for at least 2 minutes, the ablation was performed at 80 degrees C for 60 seconds, using regular Radiofrequency settings. Once the procedure was completed, the needles were then removed and the area cleansed, making sure to leave some of the prepping solution back to take advantage of its long term bactericidal properties. Intra-operative Compliance: Compliant      Vitals:   03/17/21 0920 03/17/21 0932 03/17/21 0939 03/17/21 0948  BP: 114/75 116/75 97/77 98/70   Pulse:      Resp: 18 12 15 16   Temp:      SpO2: 100%  97% 98% 99%  Weight:      Height:        Start Time: 0844 hrs. End Time: 0920 hrs. Materials & Medications:  Needle(s) Type: Teflon-coated, curved tip, Radiofrequency needle(s) Gauge: 22G Length: 10cm Medication(s): Please see orders for medications and dosing details.  Imaging Guidance (Non-Spinal):          Type of Imaging Technique: Fluoroscopy Guidance (Non-Spinal) Indication(s): Assistance in needle guidance and placement for procedures requiring needle placement in or near specific anatomical locations not easily accessible without such assistance. Exposure Time: Please see nurses notes. Contrast: Before injecting any contrast, we confirmed that the patient did not have an allergy to iodine, shellfish, or radiological contrast. Once satisfactory needle placement was completed at the desired level, radiological contrast was injected. Contrast injected under live fluoroscopy. No contrast complications. See chart for type and volume of contrast used. Fluoroscopic Guidance: I was personally present during the use of fluoroscopy. "Tunnel Vision Technique" used to obtain the best possible view of the target area. Parallax error corrected before commencing the procedure. "Direction-depth-direction" technique used to introduce the needle under continuous pulsed fluoroscopy. Once target was reached, antero-posterior, oblique, and lateral fluoroscopic projection used confirm needle placement in all planes. Images permanently stored in EMR. Interpretation: I personally interpreted the imaging intraoperatively. Adequate needle placement confirmed in multiple planes. Appropriate spread of contrast into desired area was observed. No evidence of afferent or efferent intravascular uptake. Permanent images saved into the patient's record.  Antibiotic Prophylaxis:   Anti-infectives (From admission, onward)    Start     Dose/Rate Route Frequency Ordered Stop   03/17/21 0815  ceFAZolin (ANCEF) IVPB 1 g/50  mL premix        1 g 100 mL/hr over 30 Minutes Intravenous  Once 03/17/21 0800 03/17/21 0855      Indication(s): None identified  Post-operative Assessment:  Post-procedure Vital Signs:  Pulse/HCG Rate: 67(!) 58 Temp: (!) 97.3 F (36.3 C) Resp: 16 BP: 98/70 SpO2: 99 %  EBL: None  Complications: No immediate post-treatment complications observed by team, or reported by patient.  Note: The patient tolerated the entire procedure well. A repeat set of vitals were taken after the procedure and the patient was kept under observation following institutional policy, for this type of procedure. Post-procedural neurological assessment was performed, showing return to baseline, prior to discharge. The patient was provided with post-procedure discharge instructions, including a section on how to identify potential problems. Should any problems arise concerning  this procedure, the patient was given instructions to immediately contact us, at any time, without hesitation. In any case, we plan to contact the patient by telephone for a follow-up status report regarding this interventional procedure.  Comments:  No additional relevant information.  Plan of Care  Orders:  Orders Placed This Encounter  Procedures   Radiofrequency,Genicular    Scheduling Instructions:     Side(s): Right Knee     Level(s): Superior-Lateral, Superior-Medial, and Inferior-Medial Genicular Nerve(s)     Sedation: With Sedation.     Timeframe: Today    Order Specific Question:   Where will this procedure be performed?    Answer:   [facility]   DG PA[facility] C-ARM 1-60 MIN NO REPORT    Intraoperative interpretation by procedural physician at [place].    Standing Status:   Standing    Number of Occurrences:   1    Order Specific Question:   Reason for exam:    Answer:   Assistance in needle guidance and placement for procedures requiring needle placement in or near specific anatomical locations  not easily accessible without such assistance.   Informed Consent Details: Physician/Practitioner Attestation; Transcribe to consent form and obtain patient signature    Note: Always confirm laterality of pain with Ms. Bassin, before procedure. Transcribe to consent form and obtain patient signature.    Order Specific Question:   Physician/Practitioner attestation of informed consent for procedure/surgical case    Answer:   I, the physician/practitioner, attest that I have discussed with the patient the benefits, risks, side effects, alternatives, likelihood of achieving goals and potential problems during recovery for the procedure that I have provided informed consent.    Order Specific Question:   Procedure    Answer:   Radiofrequency ablation of the genicular nerves of the knee    Order Specific Question:   Physician/Practitioner performing the procedure    Answer:   Kenise Barraco A. Dossie Arbour, MD    Order Specific Question:   Indication/Reason    Answer:   Chronic knee pain   Provide equipment / supplies at bedside    "Radiofrequency Tray"; Large hemostat (x1); Small hemostat (x1); Towels (x8); 4x4 sterile sponge pack (x1) Needle type: Teflon-coated Radiofrequency Needle (Disposable   single use) Size: Regular Quantity: 3    Standing Status:   Standing    Number of Occurrences:   1    Order Specific Question:   Specify    Answer:   Radiofrequency Tray   Miscellanous precautions    Standing Status:   Standing    Number of Occurrences:   1   Latex precautions    Activate Latex-Free Protocol.    Standing Status:   Standing    Number of Occurrences:   1   Chronic Opioid Analgesic:  No opioid analgesics prescribed by our practice.  The last time that we checked, the patient was getting her pain medications from the [facility].   Medications ordered for procedure: Meds ordered this encounter  Medications   lidocaine (XYLOCAINE) 2 % (with pres) injection 400 mg   lactated ringers  infusion 1,000 mL   midazolam (VERSED) 5 MG/5ML injection 0.5-2 mg    Make sure Flumazenil is available in the pyxis when using this medication. If oversedation occurs, administer 0.2 mg IV over 15 sec. If after 45 sec no response, administer 0.2 mg again over 1 min; may repeat at 1 min intervals; not to exceed 4 doses (1  mg)   fentaNYL (SUBLIMAZE) injection 25-50 mcg    Make sure Narcan is available in the pyxis when using this medication. In the event of respiratory depression (RR< 8/min): Titrate NARCAN (naloxone) in increments of 0.1 to 0.2 mg IV at 2-3 minute intervals, until desired degree of reversal.   diphenhydrAMINE (BENADRYL) injection 12.5 mg   ceFAZolin (ANCEF) IVPB 1 g/50 mL premix    Order Specific Question:   Antibiotic Indication:    Answer:   Surgical Prophylaxis    Order Specific Question:   Other Indication:    Answer:   Surgical Prophylaxis   ropivacaine (PF) 2 mg/mL (0.2%) (NAROPIN) injection 9 mL   ondansetron (ZOFRAN) injection 4 mg   DISCONTD: heparin flush 10 UNIT/ML injection 50 Units   heparin lock flush 100 unit/mL   glycopyrrolate (ROBINUL) injection 0.2 mg   Medications administered: We administered lidocaine, lactated ringers, midazolam, fentaNYL, diphenhydrAMINE, ceFAZolin, ropivacaine (PF) 2 mg/mL (0.2%), ondansetron, and glycopyrrolate.  See the medical record for exact dosing, route, and time of administration.  Follow-up plan:   Return in about 6 weeks (around 04/28/2021) for Proc-day (T,Th), (VV), (PPE).       Interventional Therapies  Risk   Complexity Considerations:   Latex allergy  Steroid allergy  The patient gets her pain medications at the [facility]   Planned   Pending:      Under consideration:   Diagnostic bilateral SI joint injection  Diagnostic bilateral lumbar facet block  Diagnostic bilateral IA hip joint injection    Completed:   Palliative right genicular nerves RFA x4 (03/18/2020) (100/100/100/90)  Diagnostic right L3,  L4 lumbar sympathetic Blk x1 (04/08/2020) (100/100/100 x1 week/50)  Palliative right L3, L4 lumbar sympathetic RFA x4 (06/03/2020) (100/100/90/90)    Therapeutic   Palliative (PRN) options:   Palliative right genicular nerve RFA  Palliative right L3 & L4 lumbar sympathetic RFA       Recent Visits Date Type Provider Dept  02/17/21 Office Visit Milinda Pointer, MD [facility]  Showing recent visits within past 90 days and meeting all other requirements Today's Visits Date Type Provider Dept  03/17/21 Procedure visit Milinda Pointer, MD [facility]  Showing today's visits and meeting all other requirements Future Appointments Date Type Provider Dept  04/07/21 Appointment Milinda Pointer, MD [facility]  Showing future appointments within next 90 days and meeting all other requirements  Disposition: Discharge home  Discharge (Date   Time): 03/17/2021; 1005 hrs.   Primary Care Physician: [facility] [place] Location: [facility] Note by: Gaspar Cola, MD Date: 03/17/2021; Time: 1:33 PM  Disclaimer:  Medicine is not an Chief Strategy Officer. The only guarantee in medicine is that nothing is guaranteed. It is important to note that the decision to proceed with this intervention was based on the information collected from the patient. The Data and conclusions were drawn from the patient's questionnaire, the interview, and the physical examination. Because the information was provided in large part by the patient, it cannot be guaranteed that it has not been purposely or unconsciously manipulated. Every effort has been made to obtain as much relevant data as possible for this evaluation. It is important to note that the conclusions that lead to this procedure are derived in large part from the available data. Always take into account that the treatment will also be dependent on availability of resources and  existing treatment guidelines, considered by other Pain Management Practitioners  as being common knowledge and practice, at the time of the intervention. For Medico-Legal purposes, it is also important to point out that variation in procedural techniques and pharmacological choices are the acceptable norm. The indications, contraindications, technique, and results of the above procedure should only be interpreted and judged by a Board-Certified Interventional Pain Specialist with extensive familiarity and expertise in the same exact procedure and technique.

## 2021-03-17 NOTE — Progress Notes (Signed)
Safety precautions to be maintained throughout the outpatient stay will include: orient to surroundings, keep bed in low position, maintain call bell within reach at all times, provide assistance with transfer out of bed and ambulation.  

## 2021-03-17 NOTE — Patient Instructions (Signed)

## 2021-03-17 NOTE — Progress Notes (Signed)
Pt portal cath line was flushed with 10 ml of saline and  5 ml (100 unit per cc) with heparin. Her line was removed.No redness noted on discharge.

## 2021-03-17 NOTE — Progress Notes (Signed)
0851 Patient HR dropped to upper 30s. BP 79/55. Dr. Dossie Arbour here and aware. Orders given. Robinul 0.2mg  given IV push per order.

## 2021-03-18 ENCOUNTER — Telehealth: Payer: Self-pay

## 2021-03-18 NOTE — Telephone Encounter (Signed)
Post procedure phone call.  Patient states she is doing well.  

## 2021-03-23 DIAGNOSIS — B3731 Acute candidiasis of vulva and vagina: Secondary | ICD-10-CM | POA: Diagnosis not present

## 2021-03-23 DIAGNOSIS — R319 Hematuria, unspecified: Secondary | ICD-10-CM | POA: Diagnosis not present

## 2021-03-23 DIAGNOSIS — N898 Other specified noninflammatory disorders of vagina: Secondary | ICD-10-CM | POA: Diagnosis not present

## 2021-03-23 DIAGNOSIS — N939 Abnormal uterine and vaginal bleeding, unspecified: Secondary | ICD-10-CM | POA: Diagnosis not present

## 2021-03-23 DIAGNOSIS — N760 Acute vaginitis: Secondary | ICD-10-CM | POA: Diagnosis not present

## 2021-03-24 DIAGNOSIS — Z9889 Other specified postprocedural states: Secondary | ICD-10-CM | POA: Diagnosis not present

## 2021-03-24 DIAGNOSIS — H2513 Age-related nuclear cataract, bilateral: Secondary | ICD-10-CM | POA: Diagnosis not present

## 2021-04-07 ENCOUNTER — Other Ambulatory Visit: Payer: Self-pay

## 2021-04-07 ENCOUNTER — Ambulatory Visit: Payer: Medicare Other | Attending: Pain Medicine | Admitting: Pain Medicine

## 2021-04-07 DIAGNOSIS — G90521 Complex regional pain syndrome I of right lower limb: Secondary | ICD-10-CM

## 2021-04-07 DIAGNOSIS — Z96651 Presence of right artificial knee joint: Secondary | ICD-10-CM | POA: Diagnosis not present

## 2021-04-07 DIAGNOSIS — G8929 Other chronic pain: Secondary | ICD-10-CM

## 2021-04-07 DIAGNOSIS — M79604 Pain in right leg: Secondary | ICD-10-CM

## 2021-04-07 DIAGNOSIS — M25561 Pain in right knee: Secondary | ICD-10-CM | POA: Diagnosis not present

## 2021-04-07 NOTE — Progress Notes (Signed)
Patient: Kari Green  Service Category: E/M  Provider: Francisco A Naveira, MD  °DOB: 03/31/1965  DOS: 04/07/2021  Location: Office  °MRN: [id number]  Setting: Ambulatory outpatient  Referring Provider: [facility]  °Type: Established Patient  Specialty: Interventional Pain Management  PCP: [facility]  °Location: Remote location  Delivery: TeleHealth    ° °Virtual Encounter - Pain Management °PROVIDER NOTE: Information contained herein reflects review and annotations entered in association with encounter. Interpretation of such information and data should be left to medically-trained personnel. Information provided to patient can be located elsewhere in the medical record under "Patient Instructions". Document created using STT-dictation technology, any transcriptional errors that may result from process are unintentional.  °  °Contact & Pharmacy °Preferred: [phone] °Home: [phone] (home) °Mobile: [phone] (mobile) °E-mail: [email or web address]  °[id number] - [place], [place] - [address] °[address] °[place] [address] °Phone: [phone] Fax: [phone] ° °[id number] - [place], [place] - [address] °[address] °[place] [address] °Phone: [phone] Fax: [phone] ° °[facility] - [place], [place] - [address] °[address] °[place] [address] °Phone: [phone] Fax: [phone] ° °[id number] - [place], [place] - [address] °[address] °[place] [address] °Phone: [phone] Fax: [phone] °  °Pre-screening  °Kari Green offered "in-person" vs "virtual" encounter. She indicated preferring virtual for this encounter.  ° °Reason °COVID-19*   Social distancing based on CDC and AMA recommendations.  ° °I contacted Kari Green on 04/07/2021 via  telephone.      I clearly identified myself as Francisco A Naveira, MD. I verified that I was speaking with the correct person using two identifiers (Name: Kari Green, and date of birth: 05/25/1965). ° °Consent °I sought verbal advanced consent from Kari Green for virtual visit interactions. I informed Kari Green of possible security and privacy concerns, risks, and limitations associated with providing "not-in-person" medical evaluation and management services. I also informed Kari Green of the availability of "in-person" appointments. Finally, I informed her that there would be a charge for the virtual visit and that she could be  personally, fully or partially, financially responsible for it. Kari Green expressed understanding and agreed to proceed.  ° °Historic Elements   °Kari Green is a 56 y.o. year old, female patient evaluated today after our last contact on 03/17/2021. Kari Green  has a past medical history of Anemia, Anxiety, Arthritis, Asthma, Avascular necrosis (HCC), Basal cell carcinoma, Chronic migraine without aura, intractable, with status migrainosus, Chronic, continuous use of opioids, Complication of anesthesia, Degenerative disc disease, cervical, Degenerative disc disease, lumbar, Delayed gastric emptying, Difficult intravenous access, Difficulty swallowing pills, Enlarged lymph node in neck, Esophageal dysmotility, Family history of adverse reaction to anesthesia, Family history of breast cancer (08/29/2017), Family history of leukemia (08/29/2017), Family history of ovarian cancer (08/29/2017), Family history of prostate cancer (08/29/2017), Family history of thyroid cancer (08/29/2017), Fatty liver, Fibromyalgia, H/O blood transfusion reaction, Hemangioma of liver, Hemangioma, renal, Hiatal hernia, History of exercise stress test (05/08/2015), History of kidney stones (04/2014), History of skin cancer, Hypertension, IC (interstitial cystitis),  Irritable bowel syndrome (IBS), Knee pain, right, Limited joint range of motion, Melanoma (HCC), Memory impairment, Osteoporosis, Pain syndrome, chronic, Pneumonia, PONV (postoperative nausea and vomiting), Port-A-Cath in place (11/04/2016), Post-surgical hypothyroidism, Sciatica, Seasonal   allergies, Sepsis (HCC), and Squamous carcinoma. She also  has a past surgical history that includes Total thyroidectomy (1993); cysto with hydrodistension (01/29/2011); Cystoscopy w/ retrogrades (01/29/2011); Fissurectomy (~ 1988 x2); Refractive surgery (Bilateral, ~ 1999; ~ 2013); Nasal sinus surgery (2013); Laparoscopic lysis of adhesions (02/11/2012); Excision haglund's deformity with achilles tendon repair (Bilateral, 2013-2014); Melanoma excision (Left, 2013); Cystoscopy with stent placement (2006); Appendectomy (~ 1992); Tonsillectomy and adenoidectomy (~ 1989); LAPAROSCOPY ADHESIOLYSIS AND REMOVAL POSSIBLE RIGHT OVARY REMNANT (04/18/2000); Knee arthroscopy (Right, 08/16/2000); Carpal tunnel release (Bilateral, right 07/26/2001;  left ?); DEBRIDEMENT RIGHT THUMB MP JOINT (Right, 04/10/2002); RIGHT THUMB FUSION OF MPJ (Right, 10/03/2002); DECOMPRESSION LEFT ULNAR NERVE, ELBOW (Left, 01/29/2003); RIGHT TOTAL KNEE REVISION ARTHROPLASTY (Right, 03/14/2007); CYSTO/ URETHRAL DILATION/ HYDRODISTENTION/ BLADDER BX (04/10/2003); Laparoscopic assisted vaginal hysterectomy (Feb 1999); LAPAROSCOPY LYSIS ADHESIONS/  BILATERAL SALPINGOOPHORECTOMY/  FULGERATION OF ENDOMETRIOSIS (1998); Anterior cervical decomp/discectomy fusion (03/10/2009); Esophagogastroduodenoscopy (egd) with esophageal dilation (10-26-2000); CYSTO/  BLADDER BX (03/08/2008); transthoracic echocardiogram (09/22/2012); cysto with hydrodistension (N/A, 05/08/2014); laparoscopy (02/11/2012); Transanal hemorrhoidal dearterialization (03/26/2011); Knee arthroscopy with fulkerson slide (Right, 11/29/2000); Laparoscopic cholecystectomy (03/04/2002); Total knee arthroplasty (Right, 09/10/2003;  12/08/2012); Carpometacarpel (CMC) suspension plasty (Right, 02/10/2015); Ganglion cyst excision (Right, 02/10/2015); NEGATIVE SLEEP STUDY (2006 approx .  per pt); cysto with hydrodistension (N/A, 03/27/2015); Kidney stone surgery; Anterior cervical decomp/discectomy fusion (N/A, 07/16/2015); Video bronchoscopy (Bilateral, 10/20/2015); Shoulder hemi-arthroplasty (Left, 12/04/2015); EUS (N/A, 04/23/2016); Trigger finger release (Left, 07/06/2016); IR FLUORO GUIDE PORT INSERTION RIGHT (11/04/2016); IR US Guide Vasc Access Right (11/04/2016); Shoulder arthroscopy (Left); Augmentation mammaplasty (Bilateral, 1996; 2001, 2016); Breast surgery (age 6); Cystoscopy with hydrodistension and biopsy (N/A, 06/29/2017); sympathetic nerve ablation (07/05/2017); and Lymph node biopsy (Left, 07/24/2018). Ms. Berenson has a current medication list which includes the following prescription(s): alprazolam, botox, cefdinir, cyanocobalamin, doxycycline, estradiol, evzio, fluticasone, gabapentin, hydrochlorothiazide, hydromorphone, hydroxyzine, ketoconazole, ketorolac, levothyroxine, multivitamin with minerals, NON FORMULARY, onabotulinumtoxina, potassium chloride, tizanidine, topiramate, vitamin c, and hydromorphone. She  reports that she has quit smoking. Her smoking use included cigarettes. She has never used smokeless tobacco. She reports that she does not currently use alcohol. She reports that she does not use drugs. Ms. Natividad is allergic to betadine [povidone-iodine], cobalt, contrast media [iodinated contrast media], latex, nickel, peanuts [peanut oil], red dye, azithromycin, chloraprep one step [chlorhexidine gluconate], corticosteroids, flexeril [cyclobenzaprine], green tea leaf ext, morphine and related, other, percocet [oxycodone-acetaminophen], supartz [sodium hyaluronate (avian)], surgical lubricant, tape, trazodone, yellow dye, yellow dyes (non-tartrazine), cephalexin, duloxetine, monistat [miconazole], prednisone, sulfa  antibiotics, tea, and soap.  ° °HPI  °Today, she is being contacted for a post-procedure assessment. ° °Post-procedure evaluation  °   °Procedure:          Anesthesia, Analgesia, Anxiolysis:  °Type: Therapeutic Superolateral, Superomedial, and Inferomedial, Genicular Nerve Radiofrequency Ablation (destruction). #5  °Region: Lateral, Anterior, and Medial aspects of the knee joint, above and below the knee joint proper. °Level: Superior and inferior to the knee joint. °Laterality: Right  Anesthesia: Local (1-2% Lidocaine)  °Anxiolysis: IV (Versed 1 mg) °Sedation: Minimal (fentanyl 1 mL) (50 mcg) °Guidance: Fluoroscopy         ° ° °Position: Supine  ° °Indications: °1. Chronic lower extremity pain (1ry area of Pain) (Right)   °2. Chronic knee pain (2ry area of Pain) (Right)   °3. 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 CRPS (complex regional pain syndrome) type I of lower limb (Right)   ° History of arthroplasty of knee (Right)   ° History of allergy   to latex    History of allergy to IVP dye    History of postoperative nausea and vomiting    Port-A-Cath in place    History of multiple allergies    Multiple drug allergies (codeine, Lidoderm a DC if, sulfa, latex, IVP dye, Keflex, Augmentin)    Ms. Yankovich has been dealing with the above chronic pain for longer than three months and has either failed to respond, was unable to tolerate, or simply did not get enough benefit from other more conservative therapies including, but not limited to: 1. Over-the-counter medications 2. Anti-inflammatory medications 3. Muscle relaxants 4. Membrane stabilizers 5. Opioids 6. Physical therapy and/or chiropractic manipulation 7. Modalities (Heat, ice, etc.) 8. Invasive techniques such as nerve blocks. Ms. Vanschaick has attained more than 50% relief of the pain from a series of diagnostic injections conducted in separate occasions.  Pain Score: Pre-procedure: 10-Worst pain  ever/10 Post-procedure: 0-No pain/10     Effectiveness:  Initial hour after procedure: 100 %. Subsequent 4-6 hours post-procedure: 90 %. Analgesia past initial 6 hours: 90 % (ongoing). Ongoing improvement:  Analgesic: The patient indicates having attained an ongoing 90% relief of her right knee pain from the radiofrequency ablation of the genicular nerves.  This treatment continues to be effective in the treatment of her condition.  The patient refers that there is still a little area below the patella and in the medial aspect of the knee that she still having some pain but other than that, 90% of her knee pain is gone. Function: Ms. Walstad reports improvement in function ROM: Ms. Knickerbocker reports improvement in ROM  Pharmacotherapy Assessment   Opioid Analgesic: No opioid analgesics prescribed by our practice.  The last time that we checked, the patient was getting her pain medications from the [facility].   Monitoring: [place] PMP: PDMP reviewed during this encounter.       Pharmacotherapy: No side-effects or adverse reactions reported. Compliance: No problems identified. Effectiveness: Clinically acceptable. Plan: Refer to "POC". UDS: No results found for: SUMMARY   Laboratory Chemistry Profile   Renal Lab Results  Component Value Date   BUN 23 (H) 11/18/2020   CREATININE 1.23 (H) 11/18/2020   GFRAA >60 03/23/2019   GFRNONAA 52 (L) 11/18/2020    Hepatic Lab Results  Component Value Date   AST 24 11/18/2020   ALT 12 11/18/2020   ALBUMIN 4.1 11/18/2020   ALKPHOS 82 11/18/2020   AMYLASE 62 02/24/2021   LIPASE 34 02/24/2021    Electrolytes Lab Results  Component Value Date   NA 137 11/18/2020   K 3.2 (L) 11/18/2020   CL 101 11/18/2020   CALCIUM 9.4 11/18/2020   MG 1.9 11/18/2020    Bone No results found for: VD25OH, VD125OH2TOT, QB3419FX9, KW4097DZ3, 25OHVITD1, 25OHVITD2, 25OHVITD3, TESTOFREE, TESTOSTERONE  Inflammation (CRP: Acute Phase) (ESR: Chronic  Phase) Lab Results  Component Value Date   CRP 0.5 02/24/2021   ESRSEDRATE 5 02/24/2021   LATICACIDVEN 1.75 09/08/2013         Note: Above Lab results reviewed.  Imaging  DG PAIN CLINIC C-ARM 1-60 MIN NO REPORT Fluoro was used, but no Radiologist interpretation will be provided.  Please refer to "NOTES" tab for provider progress note.  Assessment  The primary encounter diagnosis was Chronic lower extremity pain (1ry area of Pain) (Right). 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 ° °Plan of Care  °Problem-specific:  °No problem-specific Assessment & Plan notes found for this encounter. ° °Ms. Kennette M Wellnitz has a current medication list which includes the following long-term medication(s): estradiol, fluticasone, hydrochlorothiazide, hydromorphone, potassium chloride, and hydromorphone. ° °Pharmacotherapy (Medications Ordered): °No orders of the defined types were placed in this encounter. ° °Orders:  °No orders of the defined types were placed in this encounter. ° °Follow-up plan:   °No follow-ups on file.   °  °Interventional Therapies  °Risk   Complexity Considerations:   °Latex allergy  °Steroid allergy  °The patient gets her pain medications at the [facility]  ° °Planned   Pending:   °  ° °Under consideration:   °Diagnostic bilateral SI joint injection  °Diagnostic bilateral lumbar facet MBB  °Diagnostic bilateral IA hip joint injection   ° °Completed:   °Palliative right genicular nerves RFA x5 (03/17/2021) (100/90/90/90)  °Diagnostic right L3, L4 lumbar sympathetic Blk x1 (04/08/2020) (100/100/100 x1 week/50)  °Palliative right L3, L4 lumbar sympathetic RFA x4 (06/03/2020) (100/100/90/90)   ° °Completed by other providers:   °  ° °Therapeutic   Palliative  (PRN) options:   °Palliative right genicular nerve RFA  °Palliative right L3 & L4 lumbar sympathetic RFA   °  °Recent Visits °Date Type Provider Dept  °03/17/21 Procedure visit Naveira, Francisco, MD [facility]  °02/17/21 Office Visit Naveira, Francisco, MD [facility]  °Showing recent visits within past 90 days and meeting all other requirements °Today's Visits °Date Type Provider Dept  °04/07/21 Office Visit Naveira, Francisco, MD [facility]  °Showing today's visits and meeting all other requirements °Future Appointments °No visits were found meeting these conditions. °Showing future appointments within next 90 days and meeting all other requirements ° °I discussed the assessment and treatment plan with the patient. The patient was provided an opportunity to ask questions and all were answered. The patient agreed with the plan and demonstrated an understanding of the instructions. ° °Patient advised to call back or seek an in-person evaluation if the symptoms or condition worsens. ° °Duration of encounter: 12 minutes. ° °Note by: Francisco A Naveira, MD °Date: 04/07/2021; Time: 2:59 PM °

## 2021-04-13 DIAGNOSIS — M879 Osteonecrosis, unspecified: Secondary | ICD-10-CM | POA: Diagnosis not present

## 2021-04-13 DIAGNOSIS — R030 Elevated blood-pressure reading, without diagnosis of hypertension: Secondary | ICD-10-CM | POA: Diagnosis not present

## 2021-04-13 DIAGNOSIS — Z79899 Other long term (current) drug therapy: Secondary | ICD-10-CM | POA: Diagnosis not present

## 2021-04-13 DIAGNOSIS — M8700 Idiopathic aseptic necrosis of unspecified bone: Secondary | ICD-10-CM | POA: Diagnosis not present

## 2021-04-13 DIAGNOSIS — M797 Fibromyalgia: Secondary | ICD-10-CM | POA: Diagnosis not present

## 2021-04-13 DIAGNOSIS — Z6822 Body mass index (BMI) 22.0-22.9, adult: Secondary | ICD-10-CM | POA: Diagnosis not present

## 2021-04-13 DIAGNOSIS — K861 Other chronic pancreatitis: Secondary | ICD-10-CM | POA: Diagnosis not present

## 2021-04-14 DIAGNOSIS — M67911 Unspecified disorder of synovium and tendon, right shoulder: Secondary | ICD-10-CM | POA: Diagnosis not present

## 2021-04-14 DIAGNOSIS — M533 Sacrococcygeal disorders, not elsewhere classified: Secondary | ICD-10-CM | POA: Diagnosis not present

## 2021-04-15 DIAGNOSIS — Z79899 Other long term (current) drug therapy: Secondary | ICD-10-CM | POA: Diagnosis not present

## 2021-04-20 ENCOUNTER — Non-Acute Institutional Stay (HOSPITAL_COMMUNITY)
Admission: RE | Admit: 2021-04-20 | Discharge: 2021-04-20 | Disposition: A | Discharge status: Home / Self Care | Payer: BC Managed Care – PPO | Source: Ambulatory Visit | Attending: Internal Medicine | Admitting: Internal Medicine

## 2021-04-20 ENCOUNTER — Other Ambulatory Visit: Payer: Self-pay

## 2021-04-20 DIAGNOSIS — R319 Hematuria, unspecified: Secondary | ICD-10-CM | POA: Diagnosis not present

## 2021-04-20 DIAGNOSIS — N3010 Interstitial cystitis (chronic) without hematuria: Secondary | ICD-10-CM | POA: Diagnosis not present

## 2021-04-20 DIAGNOSIS — Z452 Encounter for adjustment and management of vascular access device: Secondary | ICD-10-CM | POA: Diagnosis not present

## 2021-04-20 DIAGNOSIS — N200 Calculus of kidney: Secondary | ICD-10-CM | POA: Diagnosis not present

## 2021-04-20 LAB — COMPREHENSIVE METABOLIC PANEL
ALT: 9 U/L (ref 0–44)
AST: 17 U/L (ref 15–41)
Albumin: 4 g/dL (ref 3.5–5.0)
Alkaline Phosphatase: 49 U/L (ref 38–126)
Anion gap: 4 — ABNORMAL LOW (ref 5–15)
BUN: 17 mg/dL (ref 6–20)
CO2: 23 mmol/L (ref 22–32)
Calcium: 8.2 mg/dL — ABNORMAL LOW (ref 8.9–10.3)
Chloride: 111 mmol/L (ref 98–111)
Creatinine, Ser: 0.72 mg/dL (ref 0.44–1.00)
GFR, Estimated: >60 mL/min (ref >60)
Glucose, Bld: 84 mg/dL (ref 70–99)
Potassium: 3 mmol/L — ABNORMAL LOW (ref 3.5–5.1)
Sodium: 138 mmol/L (ref 135–145)
Total Bilirubin: 0.4 mg/dL (ref 0.3–1.2)
Total Protein: 6.6 g/dL (ref 6.5–8.1)

## 2021-04-20 MED ORDER — SODIUM CHLORIDE 0.9% FLUSH: 10.0000 mL | INTRAVENOUS | Status: AC | PRN

## 2021-04-20 MED ORDER — SODIUM CHLORIDE 0.9% FLUSH
10.0000 mL | INTRAVENOUS | Status: AC | PRN
  Administered 2021-04-20: 10 mL

## 2021-04-20 MED ORDER — HEPARIN SOD (PORK) LOCK FLUSH 100 UNIT/ML IV SOLN: 250.0000 [IU] | INTRAVENOUS | Status: AC | PRN

## 2021-04-20 MED ORDER — HEPARIN SOD (PORK) LOCK FLUSH 100 UNIT/ML IV SOLN
500.0000 [IU] | INTRAVENOUS | Status: AC | PRN
  Administered 2021-04-20: 500 [IU]

## 2021-04-20 NOTE — Progress Notes (Signed)
PATIENT CARE CENTER NOTE     Provider: Lucky Cowboy, NP   Procedure: Lab draw and Port flush   Note:  Patient's right chest PAC accessed using sterile technique. Labs drawn from port (CMP) per order. Port flushed with 0.9% Sodium Chloride and Heparin and de-accessed. Gauze and Tegaderm placed over site. Patient tolerated well. Alert, oriented and ambulatory at discharge.

## 2021-04-27 DIAGNOSIS — H52221 Regular astigmatism, right eye: Secondary | ICD-10-CM | POA: Diagnosis not present

## 2021-04-27 DIAGNOSIS — H25811 Combined forms of age-related cataract, right eye: Secondary | ICD-10-CM | POA: Diagnosis not present

## 2021-04-27 DIAGNOSIS — H2511 Age-related nuclear cataract, right eye: Secondary | ICD-10-CM | POA: Diagnosis not present

## 2021-04-27 DIAGNOSIS — H524 Presbyopia: Secondary | ICD-10-CM | POA: Diagnosis not present

## 2021-05-08 ENCOUNTER — Encounter: Payer: Self-pay | Admitting: Adult Health

## 2021-05-08 ENCOUNTER — Ambulatory Visit (INDEPENDENT_AMBULATORY_CARE_PROVIDER_SITE_OTHER): Payer: BC Managed Care – PPO | Admitting: Adult Health

## 2021-05-08 DIAGNOSIS — G43709 Chronic migraine without aura, not intractable, without status migrainosus: Secondary | ICD-10-CM

## 2021-05-08 NOTE — Progress Notes (Signed)
Botox- 200 units x 1 vial ?Lot: [id number] ?Expiration: 04/2023 ?[place]: [phone] ? ?Bacteriostatic 0.9% Sodium Chloride- 39m total ?Lot: [id number] ?Expiration: 09/23/2022 ?[place] [id number]? ?Dx: GH74.142?S/P  ? ?

## 2021-05-08 NOTE — Progress Notes (Signed)
? ? ?  05/08/21: injected higher in the forehead- hairline to avoid droop of forehead. ? ? ?BOTOX PROCEDURE NOTE FOR MIGRAINE HEADACHE ? ? ? ?Contraindications and precautions discussed with patient(above). Aseptic procedure was observed and patient tolerated procedure. Procedure performed by Ward Givens, NP ? ?The condition has existed for more than 6 months, and pt does not have a diagnosis of ALS, Myasthenia Gravis or Lambert-Eaton Syndrome.  Risks and benefits of injections discussed and pt agrees to proceed with the procedure.  Written consent obtained ? ?These injections are medically necessary. These injections do not cause sedations or hallucinations which the oral therapies may cause. ? ?Indication/Diagnosis: chronic migraine ?OFBPZ(W2585) injection was performed according to protocol by [facility]. 200 units of BOTOX was dissolved into 4 cc NS.   ?[place]: [phone] ? ?Type of toxin: Botox ? ?Botox- 200 units x 1 vial ?Lot: [id number] ?Expiration: 04/2023 ?[place]: [phone] ?  ?Bacteriostatic 0.9% Sodium Chloride- 22m total ?Lot: [id number] ?Expiration: 09/23/2022 ?[place] [id number]? ? ?Description of procedure: ? ?The patient was placed in a sitting position. The standard protocol was used for Botox as follows, with 5 units of Botox injected at each site: ? ? ?-Procerus muscle, midline injection ? ?-Corrugator muscle, bilateral injection ? ?-Frontalis muscle, bilateral injection, with 2 sites each side, medial injection was performed in the upper one third of the frontalis muscle, in the region vertical from the medial inferior edge of the superior orbital rim. The lateral injection was again in the upper one third of the forehead vertically above the lateral limbus of the cornea, 1.5 cm lateral to the medial injection site. ? ?-Temporalis muscle injection, 4 sites, bilaterally. The first injection was 3 cm above the tragus of the ear, second injection site was 1.5 cm to 3 cm up from the first injection site  in line with the tragus of the ear. The third injection site was 1.5-3 cm forward between the first 2 injection sites. The fourth injection site was 1.5 cm posterior to the second injection site. ? ?-Occipitalis muscle injection, 3 sites, bilaterally. The first injection was done one half way between the occipital protuberance and the tip of the mastoid process behind the ear. The second injection site was done lateral and superior to the first, 1 fingerbreadth from the first injection. The third injection site was 1 fingerbreadth superiorly and medially from the first injection site. ? ?-Cervical paraspinal muscle injection, 2 sites, bilateral knee first injection site was 1 cm from the midline of the cervical spine, 3 cm inferior to the lower border of the occipital protuberance. The second injection site was 1.5 cm superiorly and laterally to the first injection site. ? ?-Trapezius muscle injection was performed at 3 sites, bilaterally. The first injection site was in the upper trapezius muscle halfway between the inflection point of the neck, and the acromion. The second injection site was one half way between the acromion and the first injection site. The third injection was done between the first injection site and the inflection point of the neck. ? ? ?Will return for repeat injection in 3 months. ? ? ?A 200 unit sof Botox was used, 155 units were injected, the rest of the Botox was wasted. The patient tolerated the procedure well, there were no complications of the above procedure. ? ?MWard Givens MSN, NP-C 05/08/2021, 10:54 AM ?[facility] ?[address] ?[place] [address]?[phone] ?

## 2021-05-11 DIAGNOSIS — M879 Osteonecrosis, unspecified: Secondary | ICD-10-CM | POA: Diagnosis not present

## 2021-05-11 DIAGNOSIS — Z6822 Body mass index (BMI) 22.0-22.9, adult: Secondary | ICD-10-CM | POA: Diagnosis not present

## 2021-05-11 DIAGNOSIS — Z79899 Other long term (current) drug therapy: Secondary | ICD-10-CM | POA: Diagnosis not present

## 2021-05-11 DIAGNOSIS — M797 Fibromyalgia: Secondary | ICD-10-CM | POA: Diagnosis not present

## 2021-05-11 DIAGNOSIS — M8700 Idiopathic aseptic necrosis of unspecified bone: Secondary | ICD-10-CM | POA: Diagnosis not present

## 2021-05-11 DIAGNOSIS — F411 Generalized anxiety disorder: Secondary | ICD-10-CM | POA: Diagnosis not present

## 2021-05-12 DIAGNOSIS — Z01810 Encounter for preprocedural cardiovascular examination: Secondary | ICD-10-CM | POA: Diagnosis not present

## 2021-05-12 DIAGNOSIS — R319 Hematuria, unspecified: Secondary | ICD-10-CM | POA: Diagnosis not present

## 2021-05-12 DIAGNOSIS — R001 Bradycardia, unspecified: Secondary | ICD-10-CM | POA: Diagnosis not present

## 2021-05-13 DIAGNOSIS — N200 Calculus of kidney: Secondary | ICD-10-CM | POA: Diagnosis not present

## 2021-05-13 DIAGNOSIS — R319 Hematuria, unspecified: Secondary | ICD-10-CM | POA: Diagnosis not present

## 2021-05-15 ENCOUNTER — Encounter (HOSPITAL_COMMUNITY): Payer: BC Managed Care – PPO

## 2021-05-15 DIAGNOSIS — R310 Gross hematuria: Secondary | ICD-10-CM | POA: Diagnosis not present

## 2021-05-15 DIAGNOSIS — R319 Hematuria, unspecified: Secondary | ICD-10-CM | POA: Diagnosis not present

## 2021-05-15 DIAGNOSIS — N200 Calculus of kidney: Secondary | ICD-10-CM | POA: Diagnosis not present

## 2021-05-15 DIAGNOSIS — N3001 Acute cystitis with hematuria: Secondary | ICD-10-CM | POA: Diagnosis not present

## 2021-05-15 DIAGNOSIS — N3011 Interstitial cystitis (chronic) with hematuria: Secondary | ICD-10-CM | POA: Diagnosis not present

## 2021-05-18 DIAGNOSIS — H524 Presbyopia: Secondary | ICD-10-CM | POA: Diagnosis not present

## 2021-05-18 DIAGNOSIS — H52222 Regular astigmatism, left eye: Secondary | ICD-10-CM | POA: Diagnosis not present

## 2021-05-18 DIAGNOSIS — H2512 Age-related nuclear cataract, left eye: Secondary | ICD-10-CM | POA: Diagnosis not present

## 2021-06-04 ENCOUNTER — Other Ambulatory Visit: Payer: Self-pay | Admitting: Neurology

## 2021-06-09 ENCOUNTER — Telehealth: Payer: Self-pay | Admitting: Adult Health

## 2021-06-09 NOTE — Telephone Encounter (Signed)
Received (2) 100 unit vials of Botox from [facility]. 

## 2021-08-06 ENCOUNTER — Ambulatory Visit: Payer: BC Managed Care – PPO | Admitting: Adult Health

## 2021-08-06 ENCOUNTER — Telehealth: Payer: Self-pay | Admitting: Adult Health

## 2021-08-06 NOTE — Telephone Encounter (Signed)
Pt cancelled appt due to have to be in court.

## 2021-08-06 NOTE — Telephone Encounter (Signed)
Pt would like a call from the Botox Coordinator to reschedule Botox appt. Have left several message to reschedule appt.

## 2021-08-17 ENCOUNTER — Telehealth: Payer: Self-pay

## 2021-08-27 ENCOUNTER — Ambulatory Visit: Payer: BC Managed Care – PPO | Admitting: Neurology

## 2021-08-27 ENCOUNTER — Encounter: Payer: Self-pay | Admitting: Neurology

## 2021-09-02 DIAGNOSIS — H26492 Other secondary cataract, left eye: Secondary | ICD-10-CM | POA: Diagnosis not present

## 2021-09-02 NOTE — Telephone Encounter (Signed)
I called pt was able to get appt rescheduled to 07/24.

## 2021-09-14 ENCOUNTER — Ambulatory Visit (INDEPENDENT_AMBULATORY_CARE_PROVIDER_SITE_OTHER): Payer: BC Managed Care – PPO | Admitting: Neurology

## 2021-09-14 DIAGNOSIS — G43709 Chronic migraine without aura, not intractable, without status migrainosus: Secondary | ICD-10-CM

## 2021-09-14 MED ORDER — ONABOTULINUMTOXINA 100 UNITS IJ SOLR
155.0000 [IU] | Freq: Once | INTRAMUSCULAR | Status: AC
  Administered 2021-09-14: 155 [IU] via INTRAMUSCULAR

## 2021-09-14 NOTE — Progress Notes (Signed)
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 Botox in the past at Oral Surgeon's office has improved her symptoms by 60% and improved her pain and quality of life.   She clenches all night and has morning headaches. Has tried mouth guards  She has facial botox approved by Dr. Lacinda Axon [phone]. She has approval for botox into the face for dystonia. Will call and see if we can get that transferred to [place] and what the diagnosis was so we can inject into her masseters and other facial muscles as needed, will see what Dr. Jonathon Jordan protocol is and get medical records, we could not do it at the same time as botox for migraine (10 day window) and will need to charge an extra procedure code. We will need to see how much they got approved and how much helped her, she will sign a medical release form.    11/17/2020: Doing great > 60% improvement in frequency and severity of migraines  Reviewed orally with patient, additionally signature is on file:  Botulism toxin has been approved by the Federal drug administration for treatment of chronic migraine. Botulism toxin does not cure chronic migraine and it may not be effective in some patients.  The administration of botulism toxin is accomplished by injecting a small amount of toxin into the muscles of the neck and head. Dosage must be titrated for each individual. Any benefits resulting from botulism toxin  tend to wear off after 3 months with a repeat injection required if benefit is to be maintained. Injections are usually done every 3-4 months with maximum effect peak achieved by about 2 or 3 weeks. Botulism toxin is expensive and you should be sure of what costs you will incur resulting from the injection.  The side effects of botulism toxin use for chronic migraine may include:   -Transient, and usually mild, facial weakness with facial injections  -Transient, and usually mild, head or neck weakness with head/neck injections  -Reduction or loss of forehead facial animation due to forehead muscle weakness  -Eyelid drooping  -Dry eye  -Pain at the site of injection or bruising at the site of injection  -Double vision  -Potential unknown long term risks  Contraindications: You should not have Botox if you are pregnant, nursing, allergic to albumin, have an infection, skin condition, or muscle weakness at the site of the injection, or have myasthenia gravis, Lambert-Eaton syndrome, or ALS.  It is also possible that as with any injection, there may be an allergic reaction or no effect from the medication. Reduced effectiveness after repeated injections is sometimes seen and rarely infection at the injection site may occur. All care will be taken to prevent these side effects. If therapy is given over a long time, atrophy and wasting in the muscle injected may occur. Occasionally the patient's become refractory to treatment because they develop antibodies to the toxin. In this event, therapy needs to be modified.  I have read the above  information and consent to the administration of botulism toxin.    BOTOX PROCEDURE NOTE FOR MIGRAINE HEADACHE    Contraindications and precautions discussed with patient(above). Aseptic procedure was observed and patient tolerated procedure. Procedure performed by Dr. Georgia Dom  The condition has existed for more than 6 months, and pt does not have a diagnosis  of ALS, Myasthenia Gravis or Lambert-Eaton Syndrome.  Risks and benefits of injections discussed and pt agrees to proceed with the procedure.  Written consent obtained  These injections are medically necessary. Pt  receives good benefits from these injections. These injections do not cause sedations or hallucinations which the oral therapies may cause.  Description of procedure:  The patient was placed in a sitting position. The standard protocol was used for Botox as follows, with 5 units of Botox injected at each site:   -Procerus muscle, midline injection  -Corrugator muscle, bilateral injection  -Frontalis muscle, bilateral injection, with 2 sites each side, medial injection was performed in the upper one third of the frontalis muscle, in the region vertical from the medial inferior edge of the superior orbital rim. The lateral injection was again in the upper one third of the forehead vertically above the lateral limbus of the cornea, 1.5 cm lateral to the medial injection site.  -Temporalis muscle injection, 4 sites, bilaterally. The first injection was 3 cm above the tragus of the ear, second injection site was 1.5 cm to 3 cm up from the first injection site in line with the tragus of the ear. The third injection site was 1.5-3 cm forward between the first 2 injection sites. The fourth injection site was 1.5 cm posterior to the second injection site.   -Occipitalis muscle injection, 3 sites, bilaterally. The first injection was done one half way between the occipital protuberance and the tip of the mastoid process behind the ear. The second injection site was done lateral and superior to the first, 1 fingerbreadth from the first injection. The third injection site was 1 fingerbreadth superiorly and medially from the first injection site.  -Cervical paraspinal muscle injection, 2 sites, bilateral knee first injection site was 1 cm from the midline of the cervical spine, 3 cm inferior to the  lower border of the occipital protuberance. The second injection site was 1.5 cm superiorly and laterally to the first injection site.  -Trapezius muscle injection was performed at 3 sites, bilaterally. The first injection site was in the upper trapezius muscle halfway between the inflection point of the neck, and the acromion. The second injection site was one half way between the acromion and the first injection site. The third injection was done between the first injection site and the inflection point of the neck.   Will return for repeat injection in 3 months.   155 units of Botox was used, 45u Botox not injected was wasted. The patient tolerated the procedure well, there were no complications of the above procedure.

## 2021-09-14 NOTE — Progress Notes (Signed)
Botox- 100 units x 2 vials Lot: [id number] Expiration: 10/2023 NDC: 5170-0174-94  Bacteriostatic 0.9% Sodium Chloride- 61m total Lot: [id number] Expiration: 09/24/2022 NDC: [address]-38 Dx: [id number]/P

## 2021-10-12 ENCOUNTER — Telehealth: Payer: Self-pay | Admitting: Neurology

## 2021-10-12 NOTE — Telephone Encounter (Signed)
I called pt,  she said she has signed [place] release several times to get records from Dr. 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   He had gotten approval for botox for her jaw (masseters).  I relayed that we need to fo our own botox approval for this.  She verbalized understanding. Sent CM to Hilda Blades in [place] to see about previous release.

## 2021-10-12 NOTE — Telephone Encounter (Signed)
Spoke with pt over the phone and she stated she has been waiting to hear back about botox for her jaw for a year now. From what I saw in the chart, the last notes about this issue was 02/18/2021 and 03/03/2021. Please advise as to what can be done for this

## 2021-10-12 NOTE — Telephone Encounter (Signed)
Per Hilda Blades in Medical Records she did not have a release form for this.  I called and spoke to Dr. Karleen Hampshire office, Jinny Blossom.  She said to fax MR release to [phone] specific information asking for.  # [phone].  I emailed to pt the ROI form (ok per DSettle to email). Pt will look for and get back to [place].

## 2021-10-14 NOTE — Telephone Encounter (Signed)
I have not seen release come back thru re: after pt signing, or have not see.  I have called and LMVM for pt inquiring.

## 2021-10-29 ENCOUNTER — Ambulatory Visit: Payer: BC Managed Care – PPO | Admitting: Adult Health

## 2021-11-17 ENCOUNTER — Ambulatory Visit
Admission: RE | Admit: 2021-11-17 | Discharge: 2021-11-17 | Disposition: A | Discharge status: Home / Self Care | Payer: BC Managed Care – PPO | Source: Ambulatory Visit | Attending: Family Medicine | Admitting: Family Medicine

## 2021-11-17 ENCOUNTER — Other Ambulatory Visit: Payer: Self-pay | Admitting: Family Medicine

## 2021-11-17 DIAGNOSIS — R296 Repeated falls: Secondary | ICD-10-CM

## 2021-11-19 ENCOUNTER — Ambulatory Visit: Payer: BC Managed Care – PPO | Admitting: Neurology

## 2021-11-20 ENCOUNTER — Inpatient Hospital Stay: Payer: BC Managed Care – PPO

## 2021-11-23 ENCOUNTER — Inpatient Hospital Stay: Payer: BC Managed Care – PPO

## 2021-11-23 ENCOUNTER — Non-Acute Institutional Stay (HOSPITAL_COMMUNITY)
Admission: RE | Admit: 2021-11-23 | Discharge: 2021-11-23 | Disposition: A | Discharge status: Home / Self Care | Payer: BC Managed Care – PPO | Source: Ambulatory Visit | Attending: Internal Medicine | Admitting: Internal Medicine

## 2021-11-23 ENCOUNTER — Other Ambulatory Visit: Payer: Self-pay

## 2021-11-23 DIAGNOSIS — Z452 Encounter for adjustment and management of vascular access device: Secondary | ICD-10-CM | POA: Diagnosis present

## 2021-11-23 DIAGNOSIS — E038 Other specified hypothyroidism: Secondary | ICD-10-CM | POA: Insufficient documentation

## 2021-11-23 DIAGNOSIS — E785 Hyperlipidemia, unspecified: Secondary | ICD-10-CM

## 2021-11-23 DIAGNOSIS — E039 Hypothyroidism, unspecified: Secondary | ICD-10-CM

## 2021-11-23 DIAGNOSIS — R296 Repeated falls: Secondary | ICD-10-CM

## 2021-11-23 LAB — CBC WITH DIFFERENTIAL/PLATELET
Abs Immature Granulocytes: 0.01 10*3/uL (ref 0.00–0.07)
Basophils Absolute: 0 10*3/uL (ref 0.0–0.1)
Basophils Relative: 0 %
Eosinophils Absolute: 0.1 10*3/uL (ref 0.0–0.5)
Eosinophils Relative: 1 %
HCT: 37.2 % (ref 36.0–46.0)
Hemoglobin: 12.4 g/dL (ref 12.0–15.0)
Immature Granulocytes: 0 %
Lymphocytes Relative: 23 %
Lymphs Abs: 1.4 10*3/uL (ref 0.7–4.0)
MCH: 28.7 pg (ref 26.0–34.0)
MCHC: 33.3 g/dL (ref 30.0–36.0)
MCV: 86.1 fL (ref 80.0–100.0)
Monocytes Absolute: 0.3 10*3/uL (ref 0.1–1.0)
Monocytes Relative: 6 %
Neutro Abs: 4.3 10*3/uL (ref 1.7–7.7)
Neutrophils Relative %: 70 %
Platelets: 314 10*3/uL (ref 150–400)
RBC: 4.32 MIL/uL (ref 3.87–5.11)
RDW: 15.2 % (ref 11.5–15.5)
WBC: 6.2 10*3/uL (ref 4.0–10.5)
nRBC: 0 % (ref 0.0–0.2)

## 2021-11-23 LAB — COMPREHENSIVE METABOLIC PANEL
ALT: 10 U/L (ref 0–44)
AST: 16 U/L (ref 15–41)
Albumin: 4.3 g/dL (ref 3.5–5.0)
Alkaline Phosphatase: 58 U/L (ref 38–126)
Anion gap: 7 (ref 5–15)
BUN: 15 mg/dL (ref 6–20)
CO2: 18 mmol/L — ABNORMAL LOW (ref 22–32)
Calcium: 8.8 mg/dL — ABNORMAL LOW (ref 8.9–10.3)
Chloride: 114 mmol/L — ABNORMAL HIGH (ref 98–111)
Creatinine, Ser: 1 mg/dL (ref 0.44–1.00)
GFR, Estimated: >60 mL/min (ref >60)
Glucose, Bld: 96 mg/dL (ref 70–99)
Potassium: 3.3 mmol/L — ABNORMAL LOW (ref 3.5–5.1)
Sodium: 139 mmol/L (ref 135–145)
Total Bilirubin: 1.1 mg/dL (ref 0.3–1.2)
Total Protein: 7.5 g/dL (ref 6.5–8.1)

## 2021-11-23 LAB — LIPID PANEL
Cholesterol: 262 mg/dL — ABNORMAL HIGH (ref 0–200)
HDL: 65 mg/dL (ref >40)
LDL Cholesterol: 185 mg/dL — ABNORMAL HIGH (ref 0–99)
Total CHOL/HDL Ratio: 4 RATIO
Triglycerides: 59 mg/dL (ref <150)
VLDL: 12 mg/dL (ref 0–40)

## 2021-11-23 LAB — TSH: TSH: 1.298 u[IU]/mL (ref 0.350–4.500)

## 2021-11-23 LAB — T4, FREE: Free T4: 1.29 ng/dL — ABNORMAL HIGH (ref 0.61–1.12)

## 2021-11-23 MED ORDER — HEPARIN SOD (PORK) LOCK FLUSH 100 UNIT/ML IV SOLN
500.0000 [IU] | INTRAVENOUS | Status: AC | PRN
  Administered 2021-11-23: 500 [IU]

## 2021-11-23 MED ORDER — SODIUM CHLORIDE 0.9% FLUSH
10.0000 mL | INTRAVENOUS | Status: AC | PRN
  Administered 2021-11-23: 10 mL

## 2021-11-23 NOTE — Progress Notes (Signed)
Patient's right chest PAC accessed using sterile procedure. Labs drawn from [facility] (CBC w/diff, CMP, Lipid Panel, TSH, and free T4) per Almedia Balls, NP order.  Port flushed with Heparin and 0.9% NS and de-accessed. Patient tolerated well. Lab results will be faxed to Almedia Balls, NP at [place] when available.  Patient alert, oriented and ambulatory at discharge.

## 2021-12-08 ENCOUNTER — Ambulatory Visit: Payer: BC Managed Care – PPO | Admitting: Neurology

## 2021-12-23 ENCOUNTER — Telehealth: Payer: Self-pay | Admitting: *Deleted

## 2021-12-23 DIAGNOSIS — R569 Unspecified convulsions: Secondary | ICD-10-CM

## 2021-12-23 NOTE — Telephone Encounter (Signed)
Can you check and see if pt's insurance still has same process for Botox? Previously was no PA for [id number] and [id number]. Patient has an upcoming appt on 12/30/21. Can you confirm SP vs B/B? We were getting her Botox from [facility].    Chronic Migraine CPT 64615  Botox J0585 Units:200  G43.709 Chronic Migraine without aura, not intractable, without status migrainous

## 2021-12-24 ENCOUNTER — Other Ambulatory Visit (HOSPITAL_COMMUNITY): Payer: Self-pay

## 2021-12-24 ENCOUNTER — Other Ambulatory Visit: Payer: Self-pay | Admitting: *Deleted

## 2021-12-24 ENCOUNTER — Encounter (HOSPITAL_COMMUNITY): Payer: BC Managed Care – PPO

## 2021-12-24 DIAGNOSIS — G43711 Chronic migraine without aura, intractable, with status migrainosus: Secondary | ICD-10-CM

## 2021-12-24 MED ORDER — BOTOX 100 UNITS IJ SOLR: INTRAMUSCULAR | 3 refills | Status: AC

## 2021-12-24 NOTE — Telephone Encounter (Signed)
I called [facility] and they said the Botox is still waiting to be processed by a pharmacist and once it is processed they will call the patient for consent to ship and call the office to set up delivery. They made a note that her appointment is on 11/8.

## 2021-12-24 NOTE — Telephone Encounter (Signed)
Spoke with [place], [place] with [facility]. I sent in more refills of Botox 100 unit vial. The prescription is not ready for processing quite yet. He asked that I call back this afternoon to see. I told him the patient has an appointment for Botox next week.

## 2021-12-24 NOTE — Telephone Encounter (Signed)
She has a Prior Authorization on File that expires on 08/08/2022.  Can be filled at [facility].  I was also able to get a copay card for Botox also for her.

## 2021-12-24 NOTE — Telephone Encounter (Signed)
Spoke with [place], [place] with [facility]. I sent in more refills of Botox 100 unit vial. The prescription is not ready for processing quite yet. He asked that I call back this afternoon to see. I told him the patient has an appointment for Botox next week.

## 2021-12-29 NOTE — Telephone Encounter (Signed)
I called [facility] pharmacy and spoke with Leda Min. I was told the verification was just completed today. The next step is for them to contact patient and then our office will receive a call in 24-48 hours to schedule shipment. There is no reference # for this call. I reached out to the pt. We will have to r/s her appt.

## 2021-12-30 ENCOUNTER — Ambulatory Visit: Payer: BC Managed Care – PPO | Admitting: Neurology

## 2021-12-30 NOTE — Telephone Encounter (Signed)
Pt reports to [place] that [facility] called her and Botox will be here tomorrow. I called [facility] and spoke with Rod. Botox will be here tomorrow, 11/9. Reference # for call is [id number].

## 2021-12-31 ENCOUNTER — Ambulatory Visit: Payer: Managed Care, Other (non HMO) | Admitting: Neurology

## 2021-12-31 ENCOUNTER — Encounter: Payer: Self-pay | Admitting: Neurology

## 2022-01-07 ENCOUNTER — Ambulatory Visit (INDEPENDENT_AMBULATORY_CARE_PROVIDER_SITE_OTHER): Payer: BC Managed Care – PPO | Admitting: Adult Health

## 2022-01-07 DIAGNOSIS — G43709 Chronic migraine without aura, not intractable, without status migrainosus: Secondary | ICD-10-CM | POA: Diagnosis not present

## 2022-01-07 MED ORDER — ONABOTULINUMTOXINA 100 UNITS IJ SOLR
155.0000 [IU] | Freq: Once | INTRAMUSCULAR | Status: AC
  Administered 2022-01-07: 155 [IU] via INTRAMUSCULAR

## 2022-01-07 NOTE — Progress Notes (Signed)
Botox- 100 units x 2 vials Lot: [id number]  Expiration: 04/2024 NDC: 2417-5301-04  Bacteriostatic 0.9% Sodium Chloride- 52m total Lot: [id number]: 10/24/2022 NDC: [address]-14 Dx: [id number] S/P

## 2022-01-07 NOTE — Progress Notes (Signed)
    01/07/22: Having daily migraines.  Has not had Botox since July.   BOTOX PROCEDURE NOTE FOR MIGRAINE HEADACHE    Contraindications and precautions discussed with patient(above). Aseptic procedure was observed and patient tolerated procedure. Procedure performed by Ward Givens, NP  The condition has existed for more than 6 months, and pt does not have a diagnosis of ALS, Myasthenia Gravis or Lambert-Eaton Syndrome.  Risks and benefits of injections discussed and pt agrees to proceed with the procedure.  Written consent obtained  These injections are medically necessary. These injections do not cause sedations or hallucinations which the oral therapies may cause.  Indication/Diagnosis: chronic migraine BOTOX(J0585) injection was performed according to protocol by [facility]. 200 units of BOTOX was dissolved into 4 cc NS.   NDC: [address]-63  Type of toxin: Botox  Botox- 100 units x 2 vials Lot: [id number]  Expiration: 04/2024 NDC: 7858-8502-77   Bacteriostatic 0.9% Sodium Chloride- 72m total Lot: [id number]: 10/24/2022 NDC: [address]-70  Dx: [id number]    Description of procedure:  The patient was placed in a sitting position. The standard protocol was used for Botox as follows, with 5 units of Botox injected at each site:   -Procerus muscle, midline injection  -Corrugator muscle, bilateral injection  -Frontalis muscle, bilateral injection, with 2 sites each side, medial injection was performed in the upper one third of the frontalis muscle, in the region vertical from the medial inferior edge of the superior orbital rim. The lateral injection was again in the upper one third of the forehead vertically above the lateral limbus of the cornea, 1.5 cm lateral to the medial injection site.  -Temporalis muscle injection, 4 sites, bilaterally. The first injection was 3 cm above the tragus of the ear, second injection site was 1.5 cm to 3 cm up from the first injection  site in line with the tragus of the ear. The third injection site was 1.5-3 cm forward between the first 2 injection sites. The fourth injection site was 1.5 cm posterior to the second injection site.  -Occipitalis muscle injection, 3 sites, bilaterally. The first injection was done one half way between the occipital protuberance and the tip of the mastoid process behind the ear. The second injection site was done lateral and superior to the first, 1 fingerbreadth from the first injection. The third injection site was 1 fingerbreadth superiorly and medially from the first injection site.  -Cervical paraspinal muscle injection, 2 sites, bilateral knee first injection site was 1 cm from the midline of the cervical spine, 3 cm inferior to the lower border of the occipital protuberance. The second injection site was 1.5 cm superiorly and laterally to the first injection site.  -Trapezius muscle injection was performed at 3 sites, bilaterally. The first injection site was in the upper trapezius muscle halfway between the inflection point of the neck, and the acromion. The second injection site was one half way between the acromion and the first injection site. The third injection was done between the first injection site and the inflection point of the neck.   Will return for repeat injection in 3 months.   A 200 unit sof Botox was used, 155 units were injected, the rest of the Botox was wasted. The patient tolerated the procedure well, there were no complications of the above procedure.  MWard Givens MSN, NP-C 01/07/2022, 3:07 PM [facility] [address] [place] [phone]

## 2022-01-11 ENCOUNTER — Encounter (HOSPITAL_COMMUNITY): Payer: BC Managed Care – PPO

## 2022-01-28 NOTE — Telephone Encounter (Signed)
EEG order placed

## 2022-01-28 NOTE — Addendum Note (Signed)
Addended by: Gildardo Griffes on: 01/28/2022 05:23 PM   Modules accepted: Orders

## 2022-01-28 NOTE — Telephone Encounter (Signed)
Please advise-patient called in to schedule an appointment for the "seizures" she is having. We had gotten a referral for her back in November from PCP for "frequent falls, migraine and whole body shaking episodes" Referral was reviewed by Dr. Jaynee Eagles and advised she was going to order an EEG before seeing her. Referral was closed out because patient was dismissed from our practice, but then dismissal was reversed and she was scheduled for Botox but we were unaware the dismissal had been reversed. I am unsure how to proceed. Also patient stated she is currently on vacation out of town for the next week. Thank you

## 2022-01-28 NOTE — Telephone Encounter (Signed)
Yes please reorder studies, I will see her fo rnew chief complaint. She will have to go to NP for her botox from now on thanks

## 2022-02-08 ENCOUNTER — Institutional Professional Consult (permissible substitution): Payer: BC Managed Care – PPO | Admitting: Neurology

## 2022-02-08 ENCOUNTER — Encounter: Payer: Self-pay | Admitting: Neurology

## 2022-02-09 ENCOUNTER — Other Ambulatory Visit: Payer: Self-pay | Admitting: Neurology

## 2022-02-24 DIAGNOSIS — L905 Scar conditions and fibrosis of skin: Secondary | ICD-10-CM | POA: Diagnosis not present

## 2022-02-24 DIAGNOSIS — D2262 Melanocytic nevi of left upper limb, including shoulder: Secondary | ICD-10-CM | POA: Diagnosis not present

## 2022-02-24 DIAGNOSIS — D225 Melanocytic nevi of trunk: Secondary | ICD-10-CM | POA: Diagnosis not present

## 2022-02-24 DIAGNOSIS — L820 Inflamed seborrheic keratosis: Secondary | ICD-10-CM | POA: Diagnosis not present

## 2022-02-24 DIAGNOSIS — L821 Other seborrheic keratosis: Secondary | ICD-10-CM | POA: Diagnosis not present

## 2022-02-24 DIAGNOSIS — L853 Xerosis cutis: Secondary | ICD-10-CM | POA: Diagnosis not present

## 2022-02-24 DIAGNOSIS — Z85828 Personal history of other malignant neoplasm of skin: Secondary | ICD-10-CM | POA: Diagnosis not present

## 2022-02-24 DIAGNOSIS — D2261 Melanocytic nevi of right upper limb, including shoulder: Secondary | ICD-10-CM | POA: Diagnosis not present

## 2022-03-04 ENCOUNTER — Other Ambulatory Visit: Payer: BC Managed Care – PPO | Admitting: *Deleted

## 2022-03-16 ENCOUNTER — Emergency Department (HOSPITAL_BASED_OUTPATIENT_CLINIC_OR_DEPARTMENT_OTHER): Payer: BC Managed Care – PPO

## 2022-03-16 ENCOUNTER — Other Ambulatory Visit: Payer: Self-pay

## 2022-03-16 ENCOUNTER — Encounter (HOSPITAL_BASED_OUTPATIENT_CLINIC_OR_DEPARTMENT_OTHER): Payer: Self-pay

## 2022-03-16 ENCOUNTER — Emergency Department (HOSPITAL_BASED_OUTPATIENT_CLINIC_OR_DEPARTMENT_OTHER)
Admission: EM | Admit: 2022-03-16 | Discharge: 2022-03-17 | Disposition: A | Discharge status: Home / Self Care | Payer: BC Managed Care – PPO | Attending: Emergency Medicine | Admitting: Emergency Medicine

## 2022-03-16 DIAGNOSIS — Z91010 Allergy to peanuts: Secondary | ICD-10-CM | POA: Insufficient documentation

## 2022-03-16 DIAGNOSIS — Z7951 Long term (current) use of inhaled steroids: Secondary | ICD-10-CM | POA: Insufficient documentation

## 2022-03-16 DIAGNOSIS — I10 Essential (primary) hypertension: Secondary | ICD-10-CM | POA: Insufficient documentation

## 2022-03-16 DIAGNOSIS — Z85828 Personal history of other malignant neoplasm of skin: Secondary | ICD-10-CM | POA: Insufficient documentation

## 2022-03-16 DIAGNOSIS — Z91040 Latex allergy status: Secondary | ICD-10-CM | POA: Insufficient documentation

## 2022-03-16 DIAGNOSIS — E039 Hypothyroidism, unspecified: Secondary | ICD-10-CM | POA: Diagnosis not present

## 2022-03-16 DIAGNOSIS — R112 Nausea with vomiting, unspecified: Secondary | ICD-10-CM | POA: Insufficient documentation

## 2022-03-16 DIAGNOSIS — I700 Atherosclerosis of aorta: Secondary | ICD-10-CM | POA: Diagnosis not present

## 2022-03-16 DIAGNOSIS — Z79899 Other long term (current) drug therapy: Secondary | ICD-10-CM | POA: Diagnosis not present

## 2022-03-16 DIAGNOSIS — R1084 Generalized abdominal pain: Secondary | ICD-10-CM | POA: Insufficient documentation

## 2022-03-16 DIAGNOSIS — R109 Unspecified abdominal pain: Secondary | ICD-10-CM | POA: Diagnosis not present

## 2022-03-16 DIAGNOSIS — J45909 Unspecified asthma, uncomplicated: Secondary | ICD-10-CM | POA: Diagnosis not present

## 2022-03-16 LAB — CBC WITH DIFFERENTIAL/PLATELET
Abs Immature Granulocytes: 0.02 10*3/uL (ref 0.00–0.07)
Basophils Absolute: 0 10*3/uL (ref 0.0–0.1)
Basophils Relative: 0 %
Eosinophils Absolute: 0 10*3/uL (ref 0.0–0.5)
Eosinophils Relative: 0 %
HCT: 41 % (ref 36.0–46.0)
Hemoglobin: 14.3 g/dL (ref 12.0–15.0)
Immature Granulocytes: 0 %
Lymphocytes Relative: 24 %
Lymphs Abs: 1.8 10*3/uL (ref 0.7–4.0)
MCH: 29.5 pg (ref 26.0–34.0)
MCHC: 34.9 g/dL (ref 30.0–36.0)
MCV: 84.5 fL (ref 80.0–100.0)
Monocytes Absolute: 0.4 10*3/uL (ref 0.1–1.0)
Monocytes Relative: 5 %
Neutro Abs: 5.4 10*3/uL (ref 1.7–7.7)
Neutrophils Relative %: 71 %
Platelets: 288 10*3/uL (ref 150–400)
RBC: 4.85 MIL/uL (ref 3.87–5.11)
RDW: 14.2 % (ref 11.5–15.5)
WBC: 7.7 10*3/uL (ref 4.0–10.5)
nRBC: 0 % (ref 0.0–0.2)

## 2022-03-16 LAB — COMPREHENSIVE METABOLIC PANEL
ALT: 30 U/L (ref 0–44)
AST: 56 U/L — ABNORMAL HIGH (ref 15–41)
Albumin: 4.7 g/dL (ref 3.5–5.0)
Alkaline Phosphatase: 54 U/L (ref 38–126)
Anion gap: 12 (ref 5–15)
BUN: 17 mg/dL (ref 6–20)
CO2: 22 mmol/L (ref 22–32)
Calcium: 9.6 mg/dL (ref 8.9–10.3)
Chloride: 106 mmol/L (ref 98–111)
Creatinine, Ser: 1.27 mg/dL — ABNORMAL HIGH (ref 0.44–1.00)
GFR, Estimated: 50 mL/min — ABNORMAL LOW (ref >60)
Glucose, Bld: 112 mg/dL — ABNORMAL HIGH (ref 70–99)
Potassium: 3.5 mmol/L (ref 3.5–5.1)
Sodium: 140 mmol/L (ref 135–145)
Total Bilirubin: 1 mg/dL (ref 0.3–1.2)
Total Protein: 8.1 g/dL (ref 6.5–8.1)

## 2022-03-16 LAB — LIPASE, BLOOD: Lipase: 47 U/L (ref 11–51)

## 2022-03-16 MED ORDER — METOCLOPRAMIDE HCL 5 MG/ML IJ SOLN
10.0000 mg | Freq: Once | INTRAMUSCULAR | Status: AC
  Administered 2022-03-16: 10 mg via INTRAVENOUS
  Filled 2022-03-16: qty 2

## 2022-03-16 MED ORDER — DICYCLOMINE HCL 20 MG PO TABS: 20.0000 mg | ORAL_TABLET | Freq: Two times a day (BID) | ORAL | 0 refills | Status: AC

## 2022-03-16 MED ORDER — ONDANSETRON HCL 4 MG/2ML IJ SOLN
4.0000 mg | Freq: Once | INTRAMUSCULAR | Status: AC
  Administered 2022-03-16: 4 mg via INTRAVENOUS
  Filled 2022-03-16: qty 2

## 2022-03-16 MED ORDER — ONDANSETRON 4 MG PO TBDP: 4.0000 mg | ORAL_TABLET | Freq: Three times a day (TID) | ORAL | 0 refills | Status: AC | PRN

## 2022-03-16 MED ORDER — HYDROMORPHONE HCL 1 MG/ML IJ SOLN
1.0000 mg | Freq: Once | INTRAMUSCULAR | Status: AC
  Administered 2022-03-16: 1 mg via INTRAVENOUS
  Filled 2022-03-16: qty 1

## 2022-03-16 MED ORDER — ONDANSETRON 4 MG PO TBDP
4.0000 mg | ORAL_TABLET | Freq: Once | ORAL | Status: DC
  Filled 2022-03-16: qty 1

## 2022-03-16 MED ORDER — METOCLOPRAMIDE HCL 10 MG PO TABS: 10.0000 mg | ORAL_TABLET | Freq: Three times a day (TID) | ORAL | 0 refills | Status: AC | PRN

## 2022-03-16 MED ORDER — LACTATED RINGERS IV BOLUS
1000.0000 mL | Freq: Once | INTRAVENOUS | Status: AC
  Administered 2022-03-16: 1000 mL via INTRAVENOUS

## 2022-03-16 NOTE — ED Provider Notes (Signed)
[facility] Provider Note   CSN: [id number] Arrival date & time: 03/16/22  1810     History  Chief Complaint  Patient presents with   Abdominal Pain    Kari Green is a 57 y.o. female.  57 year old female presents today for evaluation of abdominal pain, nausea, vomiting.  She states this has been ongoing since October.  She states she has history of pancreatitis and believes this is a flareup.  Denies fever, dysuria, flank pain, chest pain, shortness of breath.  She states her last pancreatitis flareup was in October when these particular symptoms have began.  States her symptoms have worsened since then.  States this has been a chronic issue even before October.  She states she is mostly on a soft fluid diet.  She states she has gone on the liquid diet which in the past has resolved.  Symptoms however not at this time.  She also states in the past she has been told she had slow gastric emptying.  Currently not on Reglan.  The history is provided by the patient. No language interpreter was used.       Home Medications Prior to Admission medications   Medication Sig Start Date End Date Taking? Authorizing Provider  ALPRAZolam (XANAX) 0.25 MG tablet Take 0.5 mg by mouth 3 (three) times daily as needed. 02/13/20   [provider]  botulinum toxin Type A (BOTOX) 100 units SOLR injection INJECT 155 UNITS INTO THE MUSCLES OF THE HEAD AND NECK EVERY 12 WEEKS, BY PRESCRIBER IN OFFICE FOR ADMINISTRATION. DISCARD UNUSED PORTION. 12/24/21   Melvenia Beam, MD  cefdinir (OMNICEF) 300 MG capsule Take 300 mg by mouth 2 (two) times daily. 10/16/20   [provider]  cyanocobalamin (,VITAMIN B-12,) 1000 MCG/ML injection Inject 1,000 mcg into the muscle every Tuesday. Reported on 03/25/2015    [provider]  doxycycline (VIBRAMYCIN) 100 MG capsule Take 100 mg by mouth 2 (two) times daily. 11/12/20   [provider]   estradiol (ESTRACE) 0.5 MG tablet Take 1 mg by mouth at bedtime.     [provider]  EVZIO 0.4 MG/0.4ML SOAJ AUTO INJECT AS NEEDED IN CASE OF EMERGENCY WHERE GRANDCHILDREN MAY GET A HOLD OF PATIENTS PAIN MEDICATIONS. 11/14/14   [provider]  fluticasone (FLONASE) 50 MCG/ACT nasal spray SPRAY 2 SPRAYS INTO EACH NOSTRIL DAILY Patient taking differently: Place 2 sprays into both nostrils daily. 12/15/16   Bobbitt, [place] Muta, MD  gabapentin (NEURONTIN) 300 MG capsule Take 900 mg by mouth 3 (three) times daily.  03/15/16   [provider]  hydrochlorothiazide (MICROZIDE) 12.5 MG capsule Take 12.5 mg by mouth daily. 04/16/18   [provider]  HYDROmorphone (DILAUDID) 2 MG tablet Take 2 mg by mouth every 4 (four) hours as needed for severe pain.    [provider]  HYDROmorphone (DILAUDID) 2 MG tablet Take 1 tablet (2 mg total) by mouth every 8 (eight) hours as needed for up to 7 days for severe pain. Must last 7 days. 03/18/20 03/25/20  Milinda Pointer, MD  hydrOXYzine (ATARAX/VISTARIL) 25 MG tablet Take 50 mg by mouth every 4 (four) hours as needed for itching. Alternates with benadryl    [provider]  ketoconazole (NIZORAL) 2 % shampoo One application once a month as needed for flaking 11/19/14   [provider]  ketorolac (TORADOL) 60 MG/2ML SOLN injection ADMINISTER 2 ML(60 MG) IN THE MUSCLE EVERY 6 HOURS  AS NEEDED FOR HEADACHE 06/10/21   Melvenia Beam, MD  levothyroxine (SYNTHROID, LEVOTHROID) 50 MCG tablet Take 200 mcg by mouth daily. Pt can only take name brand    [provider]  Multiple Vitamin (MULTIVITAMIN WITH MINERALS) TABS tablet Take 1 tablet by mouth at bedtime.    [provider]  NON FORMULARY 1 mL by Subdermal route every Tuesday. Allergy vaccine    [provider]  OnabotulinumtoxinA (BOTOX IJ) Inject as directed every 3 (three) months. For migraines    [provider]   potassium chloride (MICRO-K) 10 MEQ CR capsule Take 10 mEq by mouth 2 (two) times daily.    [provider]  tiZANidine (ZANAFLEX) 4 MG tablet Take 4 mg by mouth 3 (three) times daily.     [provider]  topiramate (TOPAMAX) 25 MG tablet Take 200 mg by mouth 2 (two) times daily.  04/15/15   [provider]  vitamin C (ASCORBIC ACID) 500 MG tablet Take 1,000 mg by mouth daily.    [provider]      Allergies    Betadine [povidone-iodine], Cobalt, Contrast media [iodinated contrast media], Latex, Nickel, Peanuts [peanut oil], Red dye, Azithromycin, Chloraprep one step [chlorhexidine gluconate], Corticosteroids, Flexeril [cyclobenzaprine], Green tea leaf ext, Morphine and related, Other, Percocet [oxycodone-acetaminophen], Supartz [sodium hyaluronate (avian)], Surgical lubricant, Tape, Trazodone, Yellow dye, Yellow dyes (non-tartrazine), Cephalexin, Duloxetine, Monistat [miconazole], Prednisone, Sulfa antibiotics, Tea, and Soap    Review of Systems   Review of Systems  Constitutional:  Negative for chills and fever.  Respiratory:  Negative for shortness of breath.   Cardiovascular:  Negative for chest pain.  Gastrointestinal:  Positive for abdominal pain, nausea and vomiting. Negative for blood in stool.  Genitourinary:  Negative for dysuria and flank pain.  Neurological:  Negative for light-headedness.  All other systems reviewed and are negative.   Physical Exam Updated Vital Signs BP 137/83   Pulse (!) 56   Temp 98.9 F (37.2 C) (Oral)   Resp 20   Ht '5\' 3"'$  (1.6 m)   Wt 51.3 kg   SpO2 100%   BMI 20.02 kg/m  Physical Exam Vitals and nursing note reviewed.  Constitutional:      General: She is not in acute distress.    Appearance: Normal appearance. She is not ill-appearing.  HENT:     Head: Normocephalic and atraumatic.     Nose: Nose normal.  Eyes:     General: No scleral icterus.    Extraocular Movements: Extraocular movements intact.      Conjunctiva/sclera: Conjunctivae normal.  Cardiovascular:     Rate and Rhythm: Normal rate and regular rhythm.     Pulses: Normal pulses.  Pulmonary:     Effort: Pulmonary effort is normal. No respiratory distress.     Breath sounds: Normal breath sounds. No wheezing or rales.  Abdominal:     General: There is no distension.     Palpations: Abdomen is soft.     Tenderness: There is abdominal tenderness. There is no right CVA tenderness, left CVA tenderness or guarding.  Musculoskeletal:        General: Normal range of motion.     Cervical back: Normal range of motion.     Right lower leg: No edema.     Left lower leg: No edema.  Skin:    General: Skin is warm and dry.  Neurological:     General: No focal deficit present.     Mental Status:  She is alert. Mental status is at baseline.     ED Results / Procedures / Treatments   Labs (all labs ordered are listed, but only abnormal results are displayed) Labs Reviewed  COMPREHENSIVE METABOLIC PANEL - Abnormal; Notable for the following components:      Result Value   Glucose, Bld 112 (*)    Creatinine, Ser 1.27 (*)    AST 56 (*)    GFR, Estimated 50 (*)    All other components within normal limits  CBC WITH DIFFERENTIAL/PLATELET  LIPASE, BLOOD  URINALYSIS, ROUTINE W REFLEX MICROSCOPIC    EKG None  Radiology CT ABDOMEN PELVIS WO CONTRAST  Result Date: 03/16/2022 CLINICAL DATA:  Abdominal pain. EXAM: CT ABDOMEN AND PELVIS WITHOUT CONTRAST TECHNIQUE: Multidetector CT imaging of the abdomen and pelvis was performed following the standard protocol without IV contrast. RADIATION DOSE REDUCTION: This exam was performed according to the departmental dose-optimization program which includes automated exposure control, adjustment of the mA and/or kV according to patient size and/or use of iterative reconstruction technique. COMPARISON:  CT abdomen pelvis dated 11/01/2016. FINDINGS: Evaluation of this exam is limited in the absence  of intravenous contrast. Lower chest: Minimal bibasilar linear atelectasis/scarring. The visualized lung bases are otherwise clear. Trace pericardial effusion measuring 4 mm in thickness. No intra-abdominal free air or free fluid. Hepatobiliary: The liver is unremarkable. No biliary ductal dilatation. Cholecystectomy. No retained calcified stone noted in the central CBD. Pancreas: Unremarkable. No pancreatic ductal dilatation or surrounding inflammatory changes. Spleen: Normal in size without focal abnormality. Adrenals/Urinary Tract: The adrenal glands are unremarkable. There is a 5 mm nonobstructing right renal upper pole calculus. No hydronephrosis. There is no hydronephrosis or nephrolithiasis on the left. Small left parapelvic cysts. No follow-up. The visualized ureters are unremarkable. The urinary bladder is collapsed. Stomach/Bowel: There is no bowel obstruction or active inflammation. The appendix is not visualized with certainty. No inflammatory changes identified in the right lower quadrant. Vascular/Lymphatic: Mild aortoiliac atherosclerotic disease. The IVC is unremarkable. No portal venous gas. There is no adenopathy. Reproductive: Hysterectomy.  No adnexal masses. Other: Bilateral breast implants. Musculoskeletal: No acute osseous pathology. IMPRESSION: 1. No acute intra-abdominal or pelvic pathology. 2. A 5 mm nonobstructing right renal upper pole calculus. No hydronephrosis. 3.  Aortic Atherosclerosis (ICD10-I70.0). Electronically Signed   By: Anner Crete M.D.   On: 03/16/2022 19:34    Procedures Procedures    Medications Ordered in ED Medications  HYDROmorphone (DILAUDID) injection 1 mg (1 mg Intravenous Given 03/16/22 2305)  ondansetron (ZOFRAN) injection 4 mg (4 mg Intravenous Given 03/16/22 2304)  lactated ringers bolus 1,000 mL (1,000 mLs Intravenous New Bag/Given 03/16/22 2303)    ED Course/ Medical Decision Making/ A&P                             Medical Decision  Making Amount and/or Complexity of Data Reviewed Labs: ordered. Radiology: ordered.  Risk Prescription drug management.   Medical Decision Making / ED Course   This patient presents to the ED for concern of abdominal pain, emesis, this involves an extensive number of treatment options, and is a complaint that carries with it a high risk of complications and morbidity.  The differential diagnosis includes pancreatitis, gastroenteritis, gastroparesis, SBO  MDM: 57 year old female with past medical history significant for cholecystectomy, patent ectomy presents today for evaluation of abdominal pain, emesis.  Symptoms have been ongoing since October with this particular episode.  Has  had previous similar episodes.  Reports she has been on a soft food diet.  Has seen gastroenterology in the past however unable to get in until March with her primary gastroenterologist.  Overall well-appearing.  Does have diffuse abdominal tenderness.  CBC without leukocytosis or anemia.  CMP with creatinine 1.27, normal BUN.  Normal electrolytes.  Otherwise without acute findings.  Normal LFTs.  Lipase within normal limits.  CT abdomen pelvis without contrast shows no evidence of acute pancreatitis or other acute process.  No evidence of small bowel obstruction.  Will provide fluid bolus, pain and nausea medication and reevaluate. Upon reevaluation patient does report some improvement.  Half of fluid bolus administered.  Will give additional dose of Dilaudid.  Given her history of slow gastric emptying we will give her dose of Reglan as well.  Denies definitive diagnosis of gastroparesis.  She is unable to get in with her gastroenterologist until March.  Will give referral to Dr. Therisa Doyne in case she is able to see him sooner.  Patient is otherwise stable for discharge.  Plan discussed with patient and husband.  They are both comfortable with being discharged home with follow-up with gastroenterology.  Strict return  precaution discussed.  They voiced understanding and are in agreement with plan.  Lab Tests: -I ordered, reviewed, and interpreted labs.   The pertinent results include:   Labs Reviewed  COMPREHENSIVE METABOLIC PANEL - Abnormal; Notable for the following components:      Result Value   Glucose, Bld 112 (*)    Creatinine, Ser 1.27 (*)    AST 56 (*)    GFR, Estimated 50 (*)    All other components within normal limits  CBC WITH DIFFERENTIAL/PLATELET  LIPASE, BLOOD  URINALYSIS, ROUTINE W REFLEX MICROSCOPIC      EKG  EKG Interpretation  Date/Time:    Ventricular Rate:    PR Interval:    QRS Duration:   QT Interval:    QTC Calculation:   R Axis:     Text Interpretation:           Imaging Studies ordered: I ordered imaging studies including CT abdomen pelvis without contrast I independently visualized and interpreted imaging. I agree with the radiologist interpretation   Medicines ordered and prescription drug management: Meds ordered this encounter  Medications   DISCONTD: ondansetron (ZOFRAN-ODT) disintegrating tablet 4 mg   HYDROmorphone (DILAUDID) injection 1 mg   ondansetron (ZOFRAN) injection 4 mg   lactated ringers bolus 1,000 mL   metoCLOPramide (REGLAN) injection 10 mg   HYDROmorphone (DILAUDID) injection 1 mg    -I have reviewed the patients home medicines and have made adjustments as needed  Critical interventions Pain medication, fluid bolus  Reevaluation: After the interventions noted above, I reevaluated the patient and found that they have :improved  Co morbidities that complicate the patient evaluation  Past Medical History:  Diagnosis Date   Anemia    Slight   Anxiety    Arthritis    right thumb   Asthma    Avascular necrosis (HCC)    left shoulder, right knee, jaws/mouth that she knows of at this time   Basal cell carcinoma    Chronic migraine without aura, intractable, with status migrainosus    neurologist-  dr Jaynee Eagles--  receives  botox injections every 3 months   Chronic, continuous use of opioids    Complication of anesthesia    "not asleep and aware of everything"   Degenerative disc disease, cervical  Degenerative disc disease, lumbar    Delayed gastric emptying    Difficult intravenous access    Now has [employer] for access   Difficulty swallowing pills    Enlarged lymph node in neck    left   Esophageal dysmotility    Family history of adverse reaction to anesthesia    family - itching, hives, PONV   Family history of breast cancer 08/29/2017   Family history of leukemia 08/29/2017   Family history of ovarian cancer 08/29/2017   Family history of prostate cancer 08/29/2017   Family history of thyroid cancer 08/29/2017   Fatty liver    PMH   Fibromyalgia    H/O blood transfusion reaction    x 3; last one caused hives an d cellulitis   Hemangioma of liver    Hemangioma, renal    Hiatal hernia    History of exercise stress test 05/08/2015   negative stress test without evidence of ischemia at given workload   History of kidney stones 04/2014   History of skin cancer    excision melanoma - scalp;  excision behind knee   Hypertension    IC (interstitial cystitis)    urologist-  dr Louis Meckel   Irritable bowel syndrome (IBS)    Knee pain, right    states gets "nerves burned" periodically   Limited joint range of motion    right knee - unable to fully extend right leg   Melanoma ([place])    Memory impairment    due to pain    Osteoporosis    Pain syndrome, chronic    Pneumonia    history of X2   PONV (postoperative nausea and vomiting)    uncontrollable itching with anesthesia; hx. of anesthesia awareness; pt request needs 50  mg of Benadryl IV/phenergan 50 mg IV   Port-A-Cath in place 11/04/2016   Post-surgical hypothyroidism    Sciatica    bilateral   Seasonal allergies    Sepsis ([place])    R knee Prosthesis 2022   Squamous carcinoma       Dispostion: Patient is appropriate for  discharge.  Discharged in stable condition.  Admission considered however did have some improvement in symptoms.  CT without surgical etiology, labs overall reassuring.  Final Clinical Impression(s) / ED Diagnoses Final diagnoses:  Generalized abdominal pain    Rx / DC Orders ED Discharge Orders          Ordered    ondansetron (ZOFRAN-ODT) 4 MG disintegrating tablet  Every 8 hours PRN        03/16/22 2359    metoCLOPramide (REGLAN) 10 MG tablet  Every 8 hours PRN        03/16/22 2359    dicyclomine (BENTYL) 20 MG tablet  2 times daily        03/16/22 2359              Evlyn Courier, PA-C 03/16/22 [place], [place], DO 03/17/22 1655

## 2022-03-16 NOTE — Discharge Instructions (Addendum)
Your workup and exam today is overall reassuring.  No emergent or surgical cause of your abdominal pain and symptoms.  Given you mention history of slow gastric emptying.  This could potentially be related to gastroparesis.  You received a dose of Reglan in the emergency department.  I have given you a short course of this upon discharge.  Please follow-up with your gastroenterologist.  I have given you a referral to Dr. Therisa Doyne.  If you are unable to get in with your gastroenterologist please call Dr. Deno Etienne in follow-up.  We also given you nausea medication called Zofran.  He also received prescription for Bentyl to use as needed for abdominal pain.  For any severe or worsening/concerning symptoms please return to the emergency department.  Otherwise you can also follow-up with your primary care provider.

## 2022-03-16 NOTE — ED Triage Notes (Signed)
Pt reports she is having a pancreatitis flare up & states it has been ongoing since Oct 31. She reports abd pain, nausea and vomiting. She reports a recurrent hx of this and the pain feels similar.  She went to a walk in clinic today and they were unable to obtain IV access so they sent her here to have her port accessed.

## 2022-03-17 DIAGNOSIS — R1084 Generalized abdominal pain: Secondary | ICD-10-CM | POA: Diagnosis not present

## 2022-03-17 MED ORDER — HEPARIN SOD (PORK) LOCK FLUSH 100 UNIT/ML IV SOLN
500.0000 [IU] | Freq: Once | INTRAVENOUS | Status: AC
  Administered 2022-03-17: 500 [IU]
  Filled 2022-03-17: qty 5

## 2022-03-18 DIAGNOSIS — M879 Osteonecrosis, unspecified: Secondary | ICD-10-CM | POA: Diagnosis not present

## 2022-03-18 DIAGNOSIS — M8700 Idiopathic aseptic necrosis of unspecified bone: Secondary | ICD-10-CM | POA: Diagnosis not present

## 2022-03-18 DIAGNOSIS — R030 Elevated blood-pressure reading, without diagnosis of hypertension: Secondary | ICD-10-CM | POA: Diagnosis not present

## 2022-03-18 DIAGNOSIS — F411 Generalized anxiety disorder: Secondary | ICD-10-CM | POA: Diagnosis not present

## 2022-03-18 DIAGNOSIS — Z79899 Other long term (current) drug therapy: Secondary | ICD-10-CM | POA: Diagnosis not present

## 2022-03-18 DIAGNOSIS — F5101 Primary insomnia: Secondary | ICD-10-CM | POA: Diagnosis not present

## 2022-03-18 DIAGNOSIS — Z6820 Body mass index (BMI) 20.0-20.9, adult: Secondary | ICD-10-CM | POA: Diagnosis not present

## 2022-03-22 DIAGNOSIS — Z79899 Other long term (current) drug therapy: Secondary | ICD-10-CM | POA: Diagnosis not present

## 2022-03-25 ENCOUNTER — Ambulatory Visit: Payer: BC Managed Care – PPO | Admitting: Neurology

## 2022-03-31 DIAGNOSIS — K2210 Ulcer of esophagus without bleeding: Secondary | ICD-10-CM | POA: Diagnosis not present

## 2022-03-31 DIAGNOSIS — K861 Other chronic pancreatitis: Secondary | ICD-10-CM | POA: Diagnosis not present

## 2022-03-31 DIAGNOSIS — R112 Nausea with vomiting, unspecified: Secondary | ICD-10-CM | POA: Diagnosis not present

## 2022-03-31 DIAGNOSIS — R569 Unspecified convulsions: Secondary | ICD-10-CM | POA: Diagnosis not present

## 2022-03-31 DIAGNOSIS — K30 Functional dyspepsia: Secondary | ICD-10-CM | POA: Diagnosis not present

## 2022-04-08 ENCOUNTER — Ambulatory Visit: Payer: BC Managed Care – PPO | Admitting: Adult Health

## 2022-07-06 ENCOUNTER — Non-Acute Institutional Stay (HOSPITAL_COMMUNITY)
Admission: RE | Admit: 2022-07-06 | Discharge: 2022-07-06 | Disposition: A | Discharge status: Home / Self Care | Payer: BC Managed Care – PPO | Source: Ambulatory Visit | Attending: Internal Medicine | Admitting: Internal Medicine

## 2022-07-06 DIAGNOSIS — Z452 Encounter for adjustment and management of vascular access device: Secondary | ICD-10-CM | POA: Diagnosis not present

## 2022-07-06 MED ORDER — SODIUM CHLORIDE 0.9% FLUSH
10.0000 mL | INTRAVENOUS | Status: AC | PRN
  Administered 2022-07-06: 10 mL

## 2022-07-06 MED ORDER — HEPARIN SOD (PORK) LOCK FLUSH 100 UNIT/ML IV SOLN
500.0000 [IU] | INTRAVENOUS | Status: AC | PRN
  Administered 2022-07-06: 500 [IU]

## 2022-07-06 NOTE — Progress Notes (Signed)
Patient's right chest PAC accessed using sterile procedure per Carilyn Goodpasture, NP order. Brisk blood return noted. Port flushed with Heparin and 0.9% NS and de-accessed. Gauze and Tegaderm placed over site. Patient tolerated well. Patient alert, oriented and ambulatory at discharge.

## 2022-08-03 ENCOUNTER — Encounter (HOSPITAL_COMMUNITY): Payer: BC Managed Care – PPO

## 2022-08-13 ENCOUNTER — Telehealth: Payer: BC Managed Care – PPO | Admitting: Pain Medicine

## 2022-09-06 ENCOUNTER — Ambulatory Visit: Payer: BC Managed Care – PPO | Admitting: Pain Medicine

## 2023-05-31 ENCOUNTER — Emergency Department (HOSPITAL_COMMUNITY)

## 2023-05-31 ENCOUNTER — Other Ambulatory Visit: Payer: Self-pay

## 2023-05-31 ENCOUNTER — Encounter (HOSPITAL_COMMUNITY): Payer: Self-pay

## 2023-05-31 ENCOUNTER — Emergency Department (HOSPITAL_COMMUNITY)
Admission: EM | Admit: 2023-05-31 | Discharge: 2023-06-01 | Disposition: A | Discharge status: Home / Self Care | Attending: Emergency Medicine | Admitting: Emergency Medicine

## 2023-05-31 DIAGNOSIS — I959 Hypotension, unspecified: Secondary | ICD-10-CM | POA: Diagnosis not present

## 2023-05-31 DIAGNOSIS — Z91040 Latex allergy status: Secondary | ICD-10-CM | POA: Insufficient documentation

## 2023-05-31 DIAGNOSIS — Z91010 Allergy to peanuts: Secondary | ICD-10-CM | POA: Insufficient documentation

## 2023-05-31 DIAGNOSIS — R001 Bradycardia, unspecified: Secondary | ICD-10-CM | POA: Diagnosis not present

## 2023-05-31 DIAGNOSIS — R4182 Altered mental status, unspecified: Secondary | ICD-10-CM | POA: Diagnosis present

## 2023-05-31 LAB — COMPREHENSIVE METABOLIC PANEL WITH GFR
ALT: 9 U/L (ref 0–44)
AST: 18 U/L (ref 15–41)
Albumin: 3.5 g/dL (ref 3.5–5.0)
Alkaline Phosphatase: 35 U/L — ABNORMAL LOW (ref 38–126)
Anion gap: 7 (ref 5–15)
BUN: 21 mg/dL — ABNORMAL HIGH (ref 6–20)
CO2: 21 mmol/L — ABNORMAL LOW (ref 22–32)
Calcium: 7.7 mg/dL — ABNORMAL LOW (ref 8.9–10.3)
Chloride: 114 mmol/L — ABNORMAL HIGH (ref 98–111)
Creatinine, Ser: 1.32 mg/dL — ABNORMAL HIGH (ref 0.44–1.00)
GFR, Estimated: 47 mL/min — ABNORMAL LOW (ref >60)
Glucose, Bld: 173 mg/dL — ABNORMAL HIGH (ref 70–99)
Potassium: 2.8 mmol/L — ABNORMAL LOW (ref 3.5–5.1)
Sodium: 142 mmol/L (ref 135–145)
Total Bilirubin: 0.5 mg/dL (ref 0.0–1.2)
Total Protein: 6 g/dL — ABNORMAL LOW (ref 6.5–8.1)

## 2023-05-31 LAB — HCG, SERUM, QUALITATIVE: Preg, Serum: NEGATIVE

## 2023-05-31 LAB — ETHANOL: Alcohol, Ethyl (B): <10 mg/dL (ref <10)

## 2023-05-31 LAB — ACETAMINOPHEN LEVEL: Acetaminophen (Tylenol), Serum: <10 ug/mL — ABNORMAL LOW (ref 10–30)

## 2023-05-31 LAB — SALICYLATE LEVEL: Salicylate Lvl: <7 mg/dL — ABNORMAL LOW (ref 7.0–30.0)

## 2023-05-31 MED ORDER — MAGNESIUM SULFATE 2 GM/50ML IV SOLN
2.0000 g | Freq: Once | INTRAVENOUS | Status: AC
  Administered 2023-06-01: 2 g via INTRAVENOUS
  Filled 2023-05-31: qty 50

## 2023-05-31 MED ORDER — EPINEPHRINE HCL 5 MG/250ML IV SOLN IN NS
INTRAVENOUS | Status: AC
  Administered 2023-05-31: 4 ug/min
  Filled 2023-05-31: qty 250

## 2023-05-31 MED ORDER — POTASSIUM CHLORIDE CRYS ER 20 MEQ PO TBCR
40.0000 meq | EXTENDED_RELEASE_TABLET | Freq: Once | ORAL | Status: AC
Start: 2023-06-01 — End: 2023-06-01
  Administered 2023-06-01: 40 meq via ORAL
  Filled 2023-05-31: qty 2

## 2023-05-31 MED ORDER — POTASSIUM CHLORIDE 20 MEQ/100ML IV SOLN: 20.0000 meq | Freq: Once | INTRAVENOUS | Status: DC

## 2023-05-31 NOTE — ED Notes (Signed)
 Epi Drip titrated to 32mcg/min per provider.

## 2023-05-31 NOTE — ED Provider Notes (Signed)
 [place] [facility] AT [facility] Provider Note   CSN: [id number] Arrival date & time: 05/31/23  1807     History {Add pertinent medical, surgical, social history, OB history to HPI:1} Chief Complaint  Patient presents with  . Drug Overdose    Kari Green is a 58 y.o. female.  With a history of complex regional pain syndrome, opioid use, chronic drug dependence who presents to the ED via EMS for altered mental status.  EMS was called given patient's somnolence in a parking lot from [employer].  Upon their arrival to the scene patient was noted to have slurred speech and was significantly hypotensive with initial blood pressure of 70/50 heart rate in the 40s.  They placed an IV and administered 500 cc IV fluids without significant increase in her blood pressure and started an epi drip at 4 mics per minute which is the only available pressor they had on the truck.  This improves her blood pressure, mental status and pallor.  In the ED patient reports taking her prescription medications as prescribed including meloxicam Xanax and tizanidine today.  She states she did not take her prescribed Dilaudid today.  She reports a history of hypotension but is not currently on midodrine or another medication for hypotension.  Denies SI HI.  No alcohol use or other drug use reported.  No fevers chills or recent infectious symptoms  Drug Overdose      Home Medications Prior to Admission medications   Medication Sig Start Date End Date Taking? Authorizing Provider  ALPRAZolam (XANAX) 0.25 MG tablet Take 0.5 mg by mouth 3 (three) times daily as needed. 02/13/20   [provider]  botulinum toxin Type A (BOTOX) 100 units SOLR injection INJECT 155 UNITS INTO THE MUSCLES OF THE HEAD AND NECK EVERY 12 WEEKS, BY PRESCRIBER IN OFFICE FOR ADMINISTRATION. DISCARD UNUSED PORTION. 12/24/21   Anson Fret, MD  cefdinir (OMNICEF) 300 MG capsule Take 300 mg by mouth 2 (two)  times daily. 10/16/20   [provider]  cyanocobalamin (,VITAMIN B-12,) 1000 MCG/ML injection Inject 1,000 mcg into the muscle every Tuesday. Reported on 03/25/2015    [provider]  dicyclomine (BENTYL) 20 MG tablet Take 1 tablet (20 mg total) by mouth 2 (two) times daily. 03/16/22   Marita Kansas, PA-C  doxycycline (VIBRAMYCIN) 100 MG capsule Take 100 mg by mouth 2 (two) times daily. 11/12/20   [provider]  estradiol (ESTRACE) 0.5 MG tablet Take 1 mg by mouth at bedtime.     [provider]  EVZIO 0.4 MG/0.4ML SOAJ AUTO INJECT AS NEEDED IN CASE OF EMERGENCY WHERE GRANDCHILDREN MAY GET A HOLD OF PATIENTS PAIN MEDICATIONS. 11/14/14   [provider]  fluticasone (FLONASE) 50 MCG/ACT nasal spray SPRAY 2 SPRAYS INTO EACH NOSTRIL DAILY Patient taking differently: Place 2 sprays into both nostrils daily. 12/15/16   Bobbitt, Heywood Iles, MD  gabapentin (NEURONTIN) 300 MG capsule Take 900 mg by mouth 3 (three) times daily.  03/15/16   [provider]  hydrochlorothiazide (MICROZIDE) 12.5 MG capsule Take 12.5 mg by mouth daily. 04/16/18   [provider]  HYDROmorphone (DILAUDID) 2 MG tablet Take 2 mg by mouth every 4 (four) hours as needed for severe pain.    [provider]  HYDROmorphone (DILAUDID) 2 MG tablet Take 1 tablet (2 mg total) by mouth every 8 (eight) hours as needed for up to 7 days for severe pain. Must last 7 days. 03/18/20  03/25/20  Delano Metz, MD  hydrOXYzine (ATARAX/VISTARIL) 25 MG tablet Take 50 mg by mouth every 4 (four) hours as needed for itching. Alternates with benadryl    [provider]  ketoconazole (NIZORAL) 2 % shampoo One application once a month as needed for flaking 11/19/14   [provider]  ketorolac (TORADOL) 60 MG/2ML SOLN injection ADMINISTER 2 ML(60 MG) IN THE MUSCLE EVERY 6 HOURS AS NEEDED FOR HEADACHE 06/10/21   Anson Fret, MD  levothyroxine (SYNTHROID, LEVOTHROID) 50 MCG  tablet Take 200 mcg by mouth daily. Pt can only take name brand    [provider]  metoCLOPramide (REGLAN) 10 MG tablet Take 1 tablet (10 mg total) by mouth every 8 (eight) hours as needed for up to 10 days for nausea or vomiting. 03/16/22 03/26/22  Marita Kansas, PA-C  Multiple Vitamin (MULTIVITAMIN WITH MINERALS) TABS tablet Take 1 tablet by mouth at bedtime.    [provider]  NON FORMULARY 1 mL by Subdermal route every Tuesday. Allergy vaccine    [provider]  OnabotulinumtoxinA (BOTOX IJ) Inject as directed every 3 (three) months. For migraines    [provider]  ondansetron (ZOFRAN-ODT) 4 MG disintegrating tablet Take 1 tablet (4 mg total) by mouth every 8 (eight) hours as needed. 03/16/22   Karie Mainland, Amjad, PA-C  potassium chloride (MICRO-K) 10 MEQ CR capsule Take 10 mEq by mouth 2 (two) times daily.    [provider]  tiZANidine (ZANAFLEX) 4 MG tablet Take 4 mg by mouth 3 (three) times daily.     [provider]  topiramate (TOPAMAX) 25 MG tablet Take 200 mg by mouth 2 (two) times daily.  04/15/15   [provider]  vitamin C (ASCORBIC ACID) 500 MG tablet Take 1,000 mg by mouth daily.    [provider]      Allergies    Betadine [povidone-iodine], Cobalt, Contrast media [iodinated contrast media], Latex, Nickel, Peanuts [peanut oil], Red dye #40 (allura red), Azithromycin, Chloraprep one step [chlorhexidine gluconate], Corticosteroids, Flexeril [cyclobenzaprine], Green tea leaf ext, Morphine and codeine, Other, Percocet [oxycodone-acetaminophen], Supartz [sodium hyaluronate (avian)], Surgical lubricant, Tape, Trazodone, Yellow dye, Yellow dyes (non-tartrazine), Cephalexin, Duloxetine, Monistat [miconazole], Prednisone, Sulfa antibiotics, Tea, and Soap    Review of Systems   Review of Systems  Physical Exam Updated Vital Signs There were no vitals taken for this visit. Physical Exam Vitals and nursing note reviewed.   HENT:     Head: Normocephalic and atraumatic.  Eyes:     Pupils: Pupils are equal, round, and reactive to light.     Comments: Pupils 3 mm equal round reactive to light  Cardiovascular:     Rate and Rhythm: Normal rate and regular rhythm.  Pulmonary:     Effort: Pulmonary effort is normal.     Breath sounds: Normal breath sounds.  Abdominal:     Palpations: Abdomen is soft.     Tenderness: There is no abdominal tenderness.  Skin:    General: Skin is warm and dry.     Coloration: Skin is not pale.     Findings: No erythema.  Neurological:     General: No focal deficit present.     Mental Status: She is alert and oriented to person, place, and time.     Sensory: No sensory deficit.     Motor: No weakness.  Psychiatric:        Mood and Affect: Mood normal.    ED Results / Procedures /  Treatments   Labs (all labs ordered are listed, but only abnormal results are displayed) Labs Reviewed - No data to display  EKG None  Radiology No results found.  Procedures Procedures  {Document cardiac monitor, telemetry assessment procedure when appropriate:1}  Medications Ordered in ED Medications - No data to display  ED Course/ Medical Decision Making/ A&P   {   Click here for ABCD2, HEART and other calculatorsREFRESH Note before signing :1}                              Medical Decision Making 58 year old female with history as above presenting given concern for altered mental status hypotension and bradycardia.  She is on a number of chronic pain medications including meloxicam, tizanidine along with Xanax and Dilaudid.  Pupils are not pinpoint and she denies taking Dilaudid today.  Denies coingestions or attempted self-harm.  No SI HI.  Suspect unintentional overdose on her home medications.  Improvement in mental status and blood pressure after initiation of epi drip.  Will continue epi drip and fluids here and continue to monitor vital signs and mental status.  Will obtain  infectious and toxicologic workup to look for other causes of altered mental status  Amount and/or Complexity of Data Reviewed Labs: ordered.   ***  {Document critical care time when appropriate:1} {Document review of labs and clinical decision tools ie heart score, Chads2Vasc2 etc:1}  {Document your independent review of radiology images, and any outside records:1} {Document your discussion with family members, caretakers, and with consultants:1} {Document social determinants of health affecting pt's care:1} {Document your decision making why or why not admission, treatments were needed:1} Final Clinical Impression(s) / ED Diagnoses Final diagnoses:  None    Rx / DC Orders ED Discharge Orders     None

## 2023-05-31 NOTE — ED Notes (Signed)
 Unable to obtain a temp at the moment. Pt just had ice chips, will try again in a few.

## 2023-05-31 NOTE — ED Triage Notes (Addendum)
 Pt BIB EMS from [employer] parking lot with slurred speech, originally refusing EMS care, reports mixing meloxicam, xanax, some kind of muscle relaxer, unknown amount and unknown time 70/50 HR 40, HR no increase, epi 29mcg/min started  Pt reports taking medication as prescribed, but having felt "off" all day

## 2023-06-01 DIAGNOSIS — I959 Hypotension, unspecified: Secondary | ICD-10-CM | POA: Diagnosis not present

## 2023-06-01 MED ORDER — KETOROLAC TROMETHAMINE 30 MG/ML IJ SOLN
30.0000 mg | Freq: Once | INTRAMUSCULAR | Status: AC
  Administered 2023-06-01: 30 mg via INTRAVENOUS
  Filled 2023-06-01: qty 1

## 2023-06-01 MED ORDER — HYDROMORPHONE HCL 2 MG PO TABS
4.0000 mg | ORAL_TABLET | Freq: Once | ORAL | Status: AC
  Administered 2023-06-01: 4 mg via ORAL
  Filled 2023-06-01: qty 2

## 2023-06-01 MED ORDER — ONDANSETRON HCL 4 MG/2ML IJ SOLN
4.0000 mg | Freq: Once | INTRAMUSCULAR | Status: AC
  Administered 2023-06-01: 4 mg via INTRAVENOUS
  Filled 2023-06-01: qty 2

## 2023-06-01 MED ORDER — POTASSIUM CHLORIDE 10 MEQ/100ML IV SOLN
10.0000 meq | INTRAVENOUS | Status: AC
  Administered 2023-06-01 (×2): 10 meq via INTRAVENOUS
  Filled 2023-06-01 (×2): qty 100

## 2023-06-01 NOTE — Discharge Instructions (Addendum)
 You were seen in the emergency department after an episode of severely low blood pressure Your blood pressure was so low that you required a medication called epinephrine to keep it up and in normal range Your potassium was low and we gave you potassium repletion along with magnesium repletion which can help bring potassium up as well.  You should continue taking your potassium supplement at home We talked about you being admitted to the hospital however you wanted to go home at this time understand the risks of doing so We have provided you with an outpatient referral to a cardiologist to see in the office given your repeatedly low blood pressure readings Return to the emergency department for repeated episodes of low blood pressure, fainting, chest pain, trouble breathing or any other concerns

## 2023-06-01 NOTE — ED Provider Notes (Signed)
 I assumed care of this patient from previous provider.  Please see their note for further details of history, exam, and MDM.   Patient complaining of pain.  Given a dose of her home Dilaudid PO.  Magnesium and potassium supplement complete.  The patient appears reasonably screened and/or stabilized for discharge and I doubt any other medical condition or other [facility] requiring further screening, evaluation, or treatment in the ED at this time. I have discussed the findings, Dx and Tx plan with the patient/family who expressed understanding and agree(s) with the plan. Discharge instructions discussed at length. The patient/family was given strict return precautions who verbalized understanding of the instructions. No further questions at time of discharge.  Disposition: Discharge  Condition: Good  ED Discharge Orders          Ordered    Ambulatory referral to Cardiology       Comments: If you have not heard from the Cardiology office within the next 72 hours please call [phone].   06/01/23 0003                   Lamir Racca, Amadeo Garnet, MD 06/01/23 [phone]

## 2023-06-05 LAB — CULTURE, BLOOD (ROUTINE X 2)
Culture: NO GROWTH
Special Requests: ADEQUATE

## 2023-09-14 NOTE — Progress Notes (Deleted)
 Cardiology Office Note:   Date:  09/14/2023  ID:  Shelsy, Seng 1965-04-02, MRN [id number] PCP:  Cristopher Bottcher, NP  [facility] Providers Cardiologist:  Wendel Haws, MD Referring MD: Pamella Ozell LABOR, DO  Chief Complaint/Reason for Referral: Hypotension ASSESSMENT:    1. Hypotension, unspecified hypotension type     PLAN:   In order of problems listed above: Hypotension: I suspect this is likely due to her medical regimen.  She is on multiple medications that can cause hypotension including hydromorphone , tizanidine, promethazine , hydroxyzine , and gabapentin .  Will obtain echocardiogram to evaluate further.          {Are you ordering a CV Procedure (e.g. stress test, cath, DCCV, TEE, etc)?   Press F2        :[id number]}   Dispo:  No follow-ups on file.       Labs/tests ordered: No orders of the defined types were placed in this encounter.   Current medicines are reviewed at length with the patient today.  The patient {ACTIONS; HAS/DOES NOT HAVE:19233} concerns regarding medicines.  I spent *** minutes reviewing all clinical data during and prior to this visit including all relevant imaging studies, laboratories, clinical information from other health systems and prior notes from both Cardiology and other specialties, interviewing the patient, conducting a complete physical examination, and coordinating care in order to formulate a comprehensive and personalized evaluation and treatment plan.   History of Present Illness:    FOCUSED PROBLEM LIST:   Chronic pain syndrome On Toradol , hydromorphone  Insomnia On Ambien   Anxiety On alprazolam  Chronic opioid use  The patient is a 58 year old female with the above listed medical problems referred by emergency room physician pia for hypotension.  Apparently the patient presented with slurred speech, and was hypotensive to a blood pressure of 70/50.  She takes multiple medications for her chronic issues including  Xanax , dicyclomine , doxycycline, gabapentin , hydromorphone , hydroxyzine , tizanidine, and Topamax .  Evaluation emergency department was unremarkable.  The patient was discharged home.  Patient was seen in follow-up by her primary care provider's office and referred to cardiology.       Current Medications: No outpatient medications have been marked as taking for the 09/20/23 encounter (Appointment) with Cayne Yom K, MD.     Review of Systems:   Please see the history of present illness.    All other systems reviewed and are negative.     EKGs/Labs/Other Test Reviewed:   EKG: April 2025 sinus rhythm and PACs  EKG Interpretation Date/Time:    Ventricular Rate:    PR Interval:    QRS Duration:    QT Interval:    QTC Calculation:   R Axis:      Text Interpretation:           Risk Assessment/Calculations:   {Does this patient have ATRIAL FIBRILLATION?:[phone]}      Physical Exam:   VS:  There were no vitals taken for this visit.   No BP recorded.  {Refresh Note OR Click here to enter BP  :1}***   Wt Readings from Last 3 Encounters:  03/16/22 113 lb (51.3 kg)  03/17/21 122 lb 8 oz (55.6 kg)  02/17/21 125 lb (56.7 kg)      GENERAL:  No apparent distress, AOx3 HEENT:  No carotid bruits, +2 carotid impulses, no scleral icterus CAR: RRR Irregular RR*** no murmurs***, gallops, rubs, or thrills RES:  Clear to auscultation bilaterally ABD:  Soft, nontender, nondistended, positive bowel sounds x 4 VASC:  +  2 radial pulses, +2 carotid pulses NEURO:  CN 2-12 grossly intact; motor and sensory grossly intact PSYCH:  No active depression or anxiety EXT:  No edema, ecchymosis, or cyanosis  Signed, Lurena MARLA Red, MD  09/14/2023 11:26 AM    [facility] [address] [place], [place], [place]  [address] Phone: [phone]; Fax: [phone]   Note:  This document was prepared using Dragon voice recognition software and may include unintentional  dictation errors.

## 2023-09-20 ENCOUNTER — Ambulatory Visit: Admitting: Internal Medicine

## 2023-09-20 DIAGNOSIS — I959 Hypotension, unspecified: Secondary | ICD-10-CM

## 2023-10-14 ENCOUNTER — Ambulatory Visit: Attending: Cardiology | Admitting: Cardiology

## 2023-10-14 ENCOUNTER — Encounter: Payer: Self-pay | Admitting: Cardiology

## 2023-10-14 VITALS — BP 88/54 | HR 65 | Resp 16 | Ht 63.0 in | Wt 116.0 lb

## 2023-10-14 DIAGNOSIS — E7800 Pure hypercholesterolemia, unspecified: Secondary | ICD-10-CM

## 2023-10-14 DIAGNOSIS — R931 Abnormal findings on diagnostic imaging of heart and coronary circulation: Secondary | ICD-10-CM | POA: Diagnosis not present

## 2023-10-14 DIAGNOSIS — I959 Hypotension, unspecified: Secondary | ICD-10-CM

## 2023-10-14 NOTE — Patient Instructions (Addendum)
 Medication Instructions:  No medication changes were made at this visit. Continue current regimen.   *If you need a refill on your cardiac medications before your next appointment, please call your pharmacy*  Lab Work: None ordered today. If you have labs (blood work) drawn today and your tests are completely normal, you will receive your results only by: MyChart Message (if you have MyChart) OR A paper copy in the mail If you have any lab test that is abnormal or we need to change your treatment, we will call you to review the results.  Testing/Procedures: Your physician has requested that you have en exercise stress myoview. For further information please visit [email or web address] Please follow instruction sheet, as given.   Your physician has requested that you have an echocardiogram. Echocardiography is a painless test that uses sound waves to create images of your heart. It provides your doctor with information about the size and shape of your heart and how well your heart's chambers and valves are working. This procedure takes approximately one hour. There are no restrictions for this procedure. Please do NOT wear cologne, perfume, aftershave, or lotions (deodorant is allowed). Please arrive 15 minutes prior to your appointment time.  Please note: We ask at that you not bring children with you during ultrasound (echo/ vascular) testing. Due to room size and safety concerns, children are not allowed in the ultrasound rooms during exams. Our front office staff cannot provide observation of children in our lobby area while testing is being conducted. An adult accompanying a patient to their appointment will only be allowed in the ultrasound room at the discretion of the ultrasound technician under special circumstances. We apologize for any inconvenience.   Follow-Up: At [facility], you and your health needs are our priority.  As part of our continuing mission to provide you with  exceptional heart care, our providers are all part of one team.  This team includes your primary Cardiologist (physician) and Advanced Practice Providers or APPs (Physician Assistants and Nurse Practitioners) who all work together to provide you with the care you need, when you need it.  Your next appointment:   1 year(s)  Provider:   Madonna Large, DO  We recommend signing up for the patient portal called MyChart.  Sign up information is provided on this After Visit Summary.  MyChart is used to connect with patients for Virtual Visits (Telemedicine).  Patients are able to view lab/test results, encounter notes, upcoming appointments, etc.  Non-urgent messages can be sent to your provider as well.   To learn more about what you can do with MyChart, go to [email or web address].   Other Instructions **Increase hydration and make sure to eat 3 heart-healthy meals daily**  **Discuss with primary care provider about lipid management and initiation of medication if needed**  **Discuss with chronic pain management physician about consolidating medications** --------------------------------------------------------------------------------------- Exercise Myoview (Stress Test) Instructions  Please arrive 15 minutes prior to your appointment time for registration and insurance purposes.   The test will take approximately 3 to 4 hours to complete; you may bring reading material.  If someone comes with you to your appointment, they will need to remain in the main lobby due to limited space in the testing area. **If you are pregnant or breastfeeding, please notify the nuclear lab prior to your appointment**   How to prepare for your Myocardial Perfusion Test: Do not eat or drink 3 hours prior to your test, except you may have water . Do not consume  products containing caffeine (regular or decaffeinated) 12 hours prior to your test. (ex: coffee, chocolate, sodas, tea). Do wear comfortable clothes (no  dresses or overalls) and walking shoes, tennis shoes preferred (No heels or open toe shoes are allowed). Do NOT wear cologne, perfume, aftershave, or lotions (deodorant is allowed). If these instructions are not followed, your test will have to be rescheduled.   Please report to [address] [place], [place] [address] for your test.  If you have questions or concerns about your appointment, you can call the Nuclear Lab at [phone].   If you cannot keep your appointment, please provide 24 hours notification to the Nuclear Lab, to avoid a possible $50 charge to your account.

## 2023-10-14 NOTE — Progress Notes (Signed)
 Cardiology Office Note:    Date:  10/14/2023  NAME:  Kari Green    MRN: [id number] DOB:  [birth year]   PCP:  Cristopher Bottcher, NP  Former Cardiology Providers: Dr. Lavona, Dr. Newman Lawrence, Dr. Shlomo Primary Cardiologist:  None, [facility] (established care 10/14/2023) Electrophysiologist:  None   Referring MD: Pamella Ozell LABOR, DO  Reason of Consult: Hypotension  Chief Complaint  Patient presents with   Hypotension   New Patient (Initial Visit)    History of Present Illness:    ADDI PAK is a 58 y.o. Caucasian female whose past medical history and cardiovascular risk factors includes: Coronary calcification, hyperlipidemia, chronic pain syndrome, insomnia, anxiety, necrosis of the left jaw (per patient). She is being seen today for the evaluation of hypotension at the request of Pamella Ozell LABOR, DO.  Patient is referred to cardiology by ED physician for evaluation of hypotension.  She presented to the ER with slurred speech and was hypotensive with a blood pressure of 70/50 mmHg she was on multiple medications that could be contributory including but not limited to: Xanax , gabapentin , hydromorphone , hydroxyzine , tizanidine, Topamax .  Her medications also include hydrochlorothiazide and Lasix  on as needed basis.  Based on records available at [facility] it appears that on the fourth cardiology and she has been seen in the last several years.  She was seen by Dr. Lavona in the past due to atypical chest pain.  Followed by Dr. Newman Lawrence for palpitations.  And most recent Dr. Shlomo in December 2021 at which time she was recommended to undergo coronary calcium score and stress test.  She was also requested to wean off of hormone replacement and start lipid-lowering agents given her hyperlipidemia.  Review of electronic medical records illustrate that she had a coronary calcium score in 2022 which was noted to be moderate with a total score of 203 placing  her at the 98th percentile.  She was advised undergo pharmacological stress test to evaluate for reversible ischemia and given her hyperlipidemia recommended to start Lipitor 40 mg p.o. nightly.  Low Blood Pressure:  Chronic condition Occurs intermittently, not every day. Started approximately 1.5-2 years ago, tracing back patient and husband state no triggers/associations. More pronounced with changing positions or prolonged standing. Not brought on by turning her head side-to-side. She consumes small meals throughout the day, unable to quantify. States that she drinks at least 2 bottles of water  per day Tries to wear compression stockings when possible. No excessive consumption of caffeinated beverages. Denies illicits or alcohol  use No prior workup for dysautonomia in the past. No workup for adrenal insufficiency in the past On multiple psychotropic and pain medications. At times patient states that her blood pressures could be >150 mmHg this happens once or twice a month.  She takes hydrochlorothiazide on a daily basis and Lasix  on as needed basis. At times has had syncopal events, last episode several months ago according to the husband  Patient has refused to have a cardiac monitor placed due to multiple allergies in the past. She was recommended to undergo stress test, states that she was told she will never pass it. Was recommended by prior providers to consider changing her hormone replacement therapy and start lipid-lowering agents, it has not been addressed.  Current Medications: Current Meds  Medication Sig   ALPRAZolam  (XANAX ) 0.25 MG tablet Take 0.5 mg by mouth 3 (three) times daily as needed.   botulinum toxin Type A  (BOTOX ) 100 units SOLR injection  INJECT 155 UNITS INTO THE MUSCLES OF THE HEAD AND NECK EVERY 12 WEEKS, BY PRESCRIBER IN OFFICE FOR ADMINISTRATION. DISCARD UNUSED PORTION.   cefdinir (OMNICEF) 300 MG capsule Take 300 mg by mouth 2 (two) times daily.    cyanocobalamin  (,VITAMIN B-12,) 1000 MCG/ML injection Inject 1,000 mcg into the muscle every Tuesday. Reported on 03/25/2015   dicyclomine  (BENTYL ) 20 MG tablet Take 1 tablet (20 mg total) by mouth 2 (two) times daily.   estradiol  (ESTRACE ) 0.5 MG tablet Take 1 mg by mouth at bedtime.    EVZIO 0.4 MG/0.4ML SOAJ AUTO INJECT AS NEEDED IN CASE OF EMERGENCY WHERE GRANDCHILDREN MAY GET A HOLD OF PATIENTS PAIN MEDICATIONS.   fluticasone  (FLONASE ) 50 MCG/ACT nasal spray SPRAY 2 SPRAYS INTO EACH NOSTRIL DAILY (Patient taking differently: Place 2 sprays into both nostrils daily.)   furosemide  (LASIX ) 40 MG tablet Take 40 mg by mouth as needed.   hydrochlorothiazide (MICROZIDE) 12.5 MG capsule Take 12.5 mg by mouth daily as needed (Patient states she takes it prn if SBP >125mmHg).   HYDROmorphone  (DILAUDID ) 2 MG tablet Take 2 mg by mouth every 4 (four) hours as needed for severe pain.   hydrOXYzine  (ATARAX /VISTARIL ) 25 MG tablet Take 50 mg by mouth every 4 (four) hours as needed for itching. Alternates with benadryl    ketoconazole (NIZORAL) 2 % shampoo One application once a month as needed for flaking   ketorolac  (TORADOL ) 60 MG/2ML SOLN injection ADMINISTER 2 ML(60 MG) IN THE MUSCLE EVERY 6 HOURS AS NEEDED FOR HEADACHE   levothyroxine  (SYNTHROID , LEVOTHROID) 50 MCG tablet Take 200 mcg by mouth daily. Pt can only take name brand   metoCLOPramide  (REGLAN ) 10 MG tablet Take 1 tablet (10 mg total) by mouth every 8 (eight) hours as needed for up to 10 days for nausea or vomiting.   Multiple Vitamin (MULTIVITAMIN WITH MINERALS) TABS tablet Take 1 tablet by mouth at bedtime.   NON FORMULARY 1 mL by Subdermal route every Tuesday. Allergy  vaccine   OnabotulinumtoxinA  (BOTOX  IJ) Inject as directed every 3 (three) months. For migraines   ondansetron  (ZOFRAN -ODT) 4 MG disintegrating tablet Take 1 tablet (4 mg total) by mouth every 8 (eight) hours as needed.   potassium chloride  (MICRO-K ) 10 MEQ CR capsule Take 10 mEq by  mouth 2 (two) times daily.   tiZANidine (ZANAFLEX) 4 MG tablet Take 4 mg by mouth 3 (three) times daily.    topiramate  (TOPAMAX ) 25 MG tablet Take 200 mg by mouth 2 (two) times daily.    vitamin C (ASCORBIC ACID) 500 MG tablet Take 1,000 mg by mouth daily.     Allergies:    Betadine  [povidone-iodine ], Cobalt, Contrast media [iodinated contrast media], Latex, Nickel, Peanuts [peanut oil], Red dye #40 (allura red), Azithromycin, Chloraprep one step [chlorhexidine  gluconate], Corticosteroids, Flexeril [cyclobenzaprine], Green tea leaf ext, Morphine  and codeine, Other, Percocet [oxycodone -acetaminophen ], Supartz [sodium hyaluronate (avian)], Surgical lubricant, Tape, Trazodone, Yellow dye #6 (sunset yellow), Yellow dyes (non-tartrazine), Cephalexin , Duloxetine, Monistat [miconazole], Prednisone, Sulfa antibiotics, Tea, and Soap   Past Medical History: Past Medical History:  Diagnosis Date   Anemia    Slight   Anxiety    Arthritis    right thumb   Asthma    Avascular necrosis (HCC)    left shoulder, right knee, jaws/mouth that she knows of at this time   Basal cell carcinoma    Chronic migraine without aura, intractable, with status migrainosus    neurologist-  dr ines--  receives botox  injections every 3 months  Chronic, continuous use of opioids    Complication of anesthesia    not asleep and aware of everything   Degenerative disc disease, cervical    Degenerative disc disease, lumbar    Delayed gastric emptying    Difficult intravenous access    Now has Powerport for access   Difficulty swallowing pills    Enlarged lymph node in neck    left   Esophageal dysmotility    Family history of adverse reaction to anesthesia    family - itching, hives, PONV   Family history of breast cancer 08/29/2017   Family history of leukemia 08/29/2017   Family history of ovarian cancer 08/29/2017   Family history of prostate cancer 08/29/2017   Family history of thyroid  cancer 08/29/2017    Fatty liver    PMH   Fibromyalgia    H/O blood transfusion reaction    x 3; last one caused hives an d cellulitis   Hemangioma of liver    Hemangioma, renal    Hiatal hernia    History of exercise stress test 05/08/2015   negative stress test without evidence of ischemia at given workload   History of kidney stones 04/2014   History of skin cancer    excision melanoma - scalp;  excision behind knee   Hypertension    IC (interstitial cystitis)    urologist-  dr cam   Irritable bowel syndrome (IBS)    Knee pain, right    states gets nerves burned periodically   Limited joint range of motion    right knee - unable to fully extend right leg   Melanoma (HCC)    Memory impairment    due to pain    Osteoporosis    Pain syndrome, chronic    Pneumonia    history of X2   PONV (postoperative nausea and vomiting)    uncontrollable itching with anesthesia; hx. of anesthesia awareness; pt request needs 50  mg of Benadryl  IV/phenergan  50 mg IV   Port-A-Cath in place 11/04/2016   Post-surgical hypothyroidism    Sciatica    bilateral   Seasonal allergies    Sepsis (HCC)    R knee Prosthesis 2022   Squamous carcinoma     Past Surgical History: Past Surgical History:  Procedure Laterality Date   ANTERIOR CERVICAL DECOMP/DISCECTOMY FUSION  03/10/2009   C5-6, C6-7   ANTERIOR CERVICAL DECOMP/DISCECTOMY FUSION N/A 07/16/2015   Procedure: ANTERIOR CERVICAL DECOMPRESSION FUSION 4-5 WITH INSTRUMENTATION AND AUTOGRAFT;  Surgeon: Oneil Priestly, MD;  Location: [facility];  Service: Orthopedics;  Laterality: N/A;  ANTERIOR CERVICAL DECOMPRESSION FUSION 4-5 WITH INSTRUMENTATION AND ALLOGRAFT   APPENDECTOMY  ~ 1992   AUGMENTATION MAMMAPLASTY Bilateral 1996; 2001, 2016   BREAST SURGERY  age 53   accessory breast tissue exc   CARPAL TUNNEL RELEASE Bilateral right 07/26/2001;  left ?   CARPOMETACARPEL SUSPENSION PLASTY Right 02/10/2015   Procedure: RIGHT THUMB SUSPENSION  ARTHROPLASTY;  Surgeon: Alm Hummer, MD;  Location: [facility];  Service: Orthopedics;  Laterality: Right;  ANESTHESIA: PRE- OP BLOCK   CYSTO WITH HYDRODISTENSION  01/29/2011   Procedure: CYSTOSCOPY/HYDRODISTENSION;  Surgeon: Oneil JAYSON Rafter, MD;  Location: [facility];  Service: Urology;  Laterality: N/A;   CYSTO WITH HYDRODISTENSION N/A 05/08/2014   Procedure: CYSTOSCOPY/HYDRODISTENSION WITH BILATERAL RETROGRADES;  Surgeon: Morene LELON cam, MD;  Location: [facility];  Service: Urology;  Laterality: N/A;   CYSTO WITH HYDRODISTENSION N/A 03/27/2015   Procedure: CYSTOSCOPY/HYDRODISTENSION OF  BLADDER, INSTILLATION OF MARCAINE /PYRIDIUM ;  Surgeon: Morene LELON Salines, MD;  Location: [facility];  Service: Urology;  Laterality: N/A;   CYSTO/  BLADDER BX  03/08/2008   CYSTO/ URETHRAL DILATION/ HYDRODISTENTION/ BLADDER BX  04/10/2003   CYSTOSCOPY W/ RETROGRADES  01/29/2011   Procedure: CYSTOSCOPY WITH RETROGRADE PYELOGRAM;  Surgeon: Mark C Ottelin, MD;  Location: [facility];  Service: Urology;  Laterality: Bilateral;   CYSTOSCOPY WITH HYDRODISTENSION AND BIOPSY N/A 06/29/2017   Procedure: CYSTOSCOPY/HYDRODISTENSION;  Surgeon: Salines Morene LELON, MD;  Location: [facility];  Service: Urology;  Laterality: N/A;   CYSTOSCOPY WITH STENT PLACEMENT  2006   URETERAL   DEBRIDEMENT RIGHT THUMB MP JOINT Right 04/10/2002   DECOMPRESSION LEFT ULNAR NERVE, ELBOW Left 01/29/2003   2nd time in 2015   ESOPHAGOGASTRODUODENOSCOPY (EGD) WITH ESOPHAGEAL DILATION  10-26-2000   EUS N/A 04/23/2016   Procedure: UPPER ENDOSCOPIC ULTRASOUND (EUS) LINEAR;  Surgeon: Belvie Just, MD;  Location: [facility];  Service: Endoscopy;  Laterality: N/A;   EXCISION HAGLUND'S DEFORMITY WITH ACHILLES TENDON REPAIR Bilateral 2013-2014   FISSURECTOMY  ~ 1988 x2   GANGLION CYST EXCISION Right 02/10/2015   Procedure: REMOVAL GANGLION OF WRIST, PARTIAL ULNAR EXCISION;  Surgeon: Alm Hummer, MD;  Location: [facility];  Service: Orthopedics;  Laterality: Right;   IR FLUORO GUIDE PORT INSERTION RIGHT  11/04/2016   IR US  GUIDE VASC ACCESS RIGHT  11/04/2016   KIDNEY STONE SURGERY     KNEE ARTHROSCOPY Right 08/16/2000   KNEE ARTHROSCOPY WITH FULKERSON SLIDE Right 11/29/2000   LAPAROSCOPIC ASSISTED VAGINAL HYSTERECTOMY  Feb 1999   LAPAROSCOPIC CHOLECYSTECTOMY  03/04/2002   LAPAROSCOPIC LYSIS OF ADHESIONS  02/11/2012   Procedure: LAPAROSCOPIC LYSIS OF ADHESIONS;  Surgeon: Krystal Deaner, MD;  Location: [facility];  Service: Gynecology;;   LAPAROSCOPY  02/11/2012   Procedure: LAPAROSCOPY DIAGNOSTIC;  Surgeon: Krystal Deaner, MD;  Location: [facility];  Service: Gynecology;  Laterality: N/A;  Diagnostic  Operative Laparoscopic    LAPAROSCOPY ADHESIOLYSIS AND REMOVAL POSSIBLE RIGHT OVARY REMNANT  04/18/2000   LAPAROSCOPY LYSIS ADHESIONS/  BILATERAL SALPINGOOPHORECTOMY/  FULGERATION OF ENDOMETRIOSIS  1998   LYMPH NODE BIOPSY Left 07/24/2018   Procedure: CERVICAL LYMPH NODE EXCISION;  Surgeon: Carlie Clark, MD;  Location: [facility];  Service: ENT;  Laterality: Left;   MELANOMA EXCISION Left 2013   behind knee    NASAL SINUS SURGERY  2013   NEGATIVE SLEEP STUDY  2006 approx .  per pt   REFRACTIVE SURGERY Bilateral ~ 1999; ~ 2013   RIGHT THUMB FUSION OF MPJ Right 10/03/2002   RIGHT TOTAL KNEE REVISION ARTHROPLASTY Right 03/14/2007   SHOULDER ARTHROSCOPY Left    put back in place   SHOULDER HEMI-ARTHROPLASTY Left 12/04/2015   Procedure: SHOULDER HEMI-ARTHROPLASTY;  Surgeon: Eva Herring, MD;  Location: [facility] OR;  Service: Orthopedics;  Laterality: Left;  Left shoulder hemiarthroplasty   sympathetic nerve ablation  07/05/2017   TONSILLECTOMY AND ADENOIDECTOMY  ~ 1989   TOTAL KNEE ARTHROPLASTY Right 09/10/2003; 12/08/2012   TOTAL THYROIDECTOMY  1993   TRANSANAL HEMORRHOIDAL DEARTERIALIZATION  03/26/2011   TRANSTHORACIC ECHOCARDIOGRAM  09/22/2012   TRIGGER FINGER RELEASE Left 07/06/2016    Procedure: LEFT THUMB TRIGGER RELEASE;  Surgeon: Hummer Alm, MD;  Location: [facility];  Service: Orthopedics;  Laterality: Left;   VIDEO BRONCHOSCOPY Bilateral 10/20/2015   Procedure: VIDEO BRONCHOSCOPY WITHOUT FLUORO;  Surgeon: Lamar GORMAN Chris, MD;  Location: [facility];  Service: Cardiopulmonary;  Laterality: Bilateral;  Social History: Social History   Tobacco Use   Smoking status: Former    Types: Cigarettes   Smokeless tobacco: Never   Tobacco comments:    stopped in 2019  Vaping Use   Vaping status: Some Days   Substances: Nicotine, Flavoring  Substance Use Topics   Alcohol  use: Not Currently    Alcohol /week: 0.0 standard drinks of alcohol     Comment: rarely   Drug use: No    Family History: Family History  Problem Relation Age of Onset   Anesthesia problems Mother    Leukemia Mother 56   Ovarian cancer Mother 45   Cervical cancer Daughter        cervical   Breast cancer Maternal Aunt        age dx unk   Skin cancer Maternal Grandmother    Throat cancer Maternal Grandfather    Thyroid  cancer Maternal Aunt    Lung cancer Maternal Aunt    Cervical cancer Maternal Aunt    Cervical cancer Maternal Aunt    Thyroid  cancer Maternal Uncle    Cancer Maternal Aunt        sinus   Prostate cancer Other    Brain cancer Cousin    Other Cousin        brain tumor   Leukemia Cousin    Bone cancer Cousin    Cervical cancer Cousin     ROS:   Review of Systems  Cardiovascular:  Positive for syncope (several months ago, per husband). Negative for chest pain, claudication, irregular heartbeat, leg swelling, near-syncope, orthopnea, palpitations and paroxysmal nocturnal dyspnea.  Respiratory:  Negative for shortness of breath.   Hematologic/Lymphatic: Negative for bleeding problem.  Neurological:  Positive for dizziness (chronic and stable) and light-headedness (chronic and stable).    EKGs/Labs/Other Studies Reviewed:   EKG: EKG  Interpretation Date/Time:  Friday October 14 2023 15:52:13 EDT Ventricular Rate:  68 PR Interval:  168 QRS Duration:  66 QT Interval:  422 QTC Calculation: 448 R Axis:   42  Text Interpretation: Normal sinus rhythm Nonspecific T wave abnormality When compared with ECG of 31-May-2023 19:20, QT has shortened Confirmed by Michele Gram [id number]) on 10/14/2023 4:09:04 PM  Echocardiogram: August 2020: Normal LV systolic function with EF 60%. Left ventricle cavity is normal in size. Normal global wall motion. Indeterminate diastolic filling pattern.  No significant valvular abnormality.   Stress Testing:  None  Coronary calcium score: 02/2020: Coronary calcium score of 203. This was 98th percentile for age and sex matched control.  Labs:    Latest Ref Rng & Units 03/16/2022   10:13 PM 11/23/2021    4:00 PM 02/24/2021   12:30 PM  CBC  WBC 4.0 - 10.5 K/uL 7.7  6.2  5.5   Hemoglobin 12.0 - 15.0 g/dL 85.6  87.5  88.6   Hematocrit 36.0 - 46.0 % 41.0  37.2  35.1   Platelets 150 - 400 K/uL 288  314  224        Latest Ref Rng & Units 05/31/2023    8:03 PM 03/16/2022   10:13 PM 11/23/2021    4:00 PM  BMP  Glucose 70 - 99 mg/dL 826  887  96   BUN 6 - 20 mg/dL 21  17  15    Creatinine 0.44 - 1.00 mg/dL 8.67  8.72  8.99   Sodium 135 - 145 mmol/L 142  140  139   Potassium 3.5 - 5.1 mmol/L 2.8  3.5  3.3  Chloride 98 - 111 mmol/L 114  106  114   CO2 22 - 32 mmol/L 21  22  18    Calcium 8.9 - 10.3 mg/dL 7.7  9.6  8.8    Lab Results  Component Value Date   CHOL 262 (H) 11/23/2021   HDL 65 11/23/2021   LDLCALC 185 (H) 11/23/2021   TRIG 59 11/23/2021   CHOLHDL 4.0 11/23/2021   No results for input(s): LIPOA in the last 8760 hours. No components found for: NTPROBNP No results for input(s): PROBNP in the last 8760 hours. No results for input(s): TSH in the last 8760 hours.  Physical Exam:    Today's Vitals   10/14/23 1557  BP: (!) 88/54  Pulse: 65  Resp: 16  SpO2: 95%  Weight:  116 lb (52.6 kg)  Height: 5' 3 (1.6 m)   Body mass index is 20.55 kg/m. Wt Readings from Last 3 Encounters:  10/14/23 116 lb (52.6 kg)  03/16/22 113 lb (51.3 kg)  03/17/21 122 lb 8 oz (55.6 kg)   Orthostatic VS for the past 72 hrs (Last 3 readings):  Orthostatic BP Patient Position BP Location Cuff Size Orthostatic Pulse  10/14/23 1602 (!) 75/45 Standing Right Arm Normal 73  10/14/23 1601 90/57 Sitting Right Arm Normal 68  10/14/23 1559 91/64 Supine Right Arm Normal 60   Physical Exam  Constitutional: No distress.  hemodynamically stable  Neck: No JVD present.  Cardiovascular: Normal rate, regular rhythm, S1 normal and S2 normal. Exam reveals no gallop, no S3 and no S4.  No murmur heard. Pulmonary/Chest: Effort normal and breath sounds normal. No stridor. She has no wheezes. She has no rales.  Musculoskeletal:        General: No edema.     Cervical back: Neck supple.  Skin: Skin is warm.   Impression & Recommendation(s):  Impression:   ICD-10-CM   1. Hypotension, unspecified hypotension type  I95.9 EKG 12-Lead    2. Agatston coronary artery calcium score between 100 and 400  R93.1 ECHOCARDIOGRAM COMPLETE    MYOCARDIAL PERFUSION IMAGING    Cardiac Stress Test: Informed Consent Details: Physician/Practitioner Attestation; Transcribe to consent form and obtain patient signature    3. Pure hypercholesterolemia  E78.00        Recommendation(s):  Hypotension, unspecified hypotension type Chronic Multifactorial: Poor oral intake/hydration, psychotropic and chronic pain medications, diuretics Recommended discontinuation of hydrochlorothiazide and Lasix  but patient states that she uses these medications on as needed basis if her blood pressures are greater than 150 mmHg (on average it disappears once or twice per month per husband).  Not sure if she truly has chronic hypertension as these could be isolated readings as opposed to a true elevated blood pressure reading in  ambulatory setting.  Recommended monitoring long-term to see if she really needs antihypertensive medications and to follow-up with PCP.  Also recommended that she reaches out to neurology and/or chronic pain medication provider to consolidate medications given her soft blood pressures.  Did not change her current prescribed her pain medications but patient states that she is taking Dilaudid  4 mg every 4 hours, Xanax  0.5 mg p.o. 3 times daily on as needed basis, hydroxyzine  50 mg every 4 hours Zanaflex 4 mg p.o. 3 times daily, Topamax  200 mg p.o. twice daily.  Orthostatic vital signs positive.  Recommended utilizing compression stockings, avoiding prolonged standing, changing positions slowly, liberalize salt and fluid intake, 3 heart healthy meals per day, recumbent exercises.  Patient and husband are made  aware that if she has a syncopal event she cannot drive or operate machinery until 6 months event-free.  Recommend discussing possible endocrinology consult with PCP to rule out adrenal insufficiency or other organic etiologies for chronic hypotension.    If symptoms continue despite the above-mentioned recommendations I have asked her to discuss with PCP with regards to tertiary care referral for possible dysautonomia evaluation.  Patient and husband voiced understanding.  Agatston coronary artery calcium score between 100 and 400 Coronary calcium score in 2022 was reported to be 203, placing her at the 98th percentile According to the result note stress test was recommended but patient never completed for reasons mentioned above. She was also recommended to transition off of hormone replacement therapy or consider alternatives as her cholesterol levels were elevated.  And also consider lipid-lowering agents.  This is also pending. Echo will be ordered to evaluate for structural heart disease and left ventricular systolic function. Discussed ischemic evaluation. Given her risk factors  recommended either nuclear stress test exercise versus pharmacologic or coronary CTA.  Patient would like to avoid coronary CTA as she is allergic to contrast and does not want to undergo prep.  In addition, she wants to avoid pharmacological stress given the fact that she has 28 allergies.  She is willing to consider exercise nuclear stress test once she heals from her right knee surgery and feels comfortable ambulating on her treadmill. No active chest pain or anginal symptoms. Recommended considering different HRT therapy given her lipid profile. Recommend she follows up with PCP for repeat fasting lipid and consider pharmacological if clinically warranted.  Patient and husband verbalized understanding  Pure hypercholesterolemia Recommend follow-up with PCP for lipid management.  I spent 65 minutes reviewing relevant clinical data that is available during and prior to this visit including echocardiogram results, coronary CTA results, labs clinical information from other health systems and prior notes from both Cardiology/ER/ other specialties, interviewing the patient, conducting a physical examination, collateral history provided by the husband, and coordinating care in order to formulate a comprehensive and personalized evaluation and treatment plan.  Orders Placed:  Orders Placed This Encounter  Procedures   Cardiac Stress Test: Informed Consent Details: Physician/Practitioner Attestation; Transcribe to consent form and obtain patient signature    Physician/Practitioner attestation of informed consent for procedure/surgical case:   I, the physician/practitioner, attest that I have discussed with the patient the benefits, risks, side effects, alternatives, likelihood of achieving goals and potential problems during recovery for the procedure that I have provided informed consent.    Procedure:   Exercise nuclear stress test    Indication/Reason:   coronary artery calcification   MYOCARDIAL  PERFUSION IMAGING    Standing Status:   Future    Expected Date:   10/21/2023    Expiration Date:   10/13/2024    Patient weight in lbs:   116    Where [facility]    Type of stress:   Exercise   EKG 12-Lead   ECHOCARDIOGRAM COMPLETE    Standing Status:   Future    Expected Date:   10/21/2023    Expiration Date:   10/13/2024    Where should this test be performed:   [facility]    Does the patient weigh less than or greater than 250 lbs?:   Patient weighs less than 250 lbs    Perflutren DEFINITY (image enhancing agent) should be administered unless hypersensitivity or allergy   exist:   Administer Perflutren    Reason for exam-Echo:   Other-Full Diagnosis List    Full ICD-10/Reason for Exam:   Coronary artery calcification [[id number]]     Final Medication List:   No orders of the defined types were placed in this encounter.   Medications Discontinued During This Encounter  Medication Reason   doxycycline (VIBRAMYCIN) 100 MG capsule Patient Preference   gabapentin  (NEURONTIN ) 300 MG capsule Patient Preference     Current Outpatient Medications:    ALPRAZolam  (XANAX ) 0.25 MG tablet, Take 0.5 mg by mouth 3 (three) times daily as needed., Disp: , Rfl:    botulinum toxin Type A  (BOTOX ) 100 units SOLR injection, INJECT 155 UNITS INTO THE MUSCLES OF THE HEAD AND NECK EVERY 12 WEEKS, BY PRESCRIBER IN OFFICE FOR ADMINISTRATION. DISCARD UNUSED PORTION., Disp: 2 each, Rfl: 3   cefdinir (OMNICEF) 300 MG capsule, Take 300 mg by mouth 2 (two) times daily., Disp: , Rfl:    cyanocobalamin  (,VITAMIN B-12,) 1000 MCG/ML injection, Inject 1,000 mcg into the muscle every Tuesday. Reported on 03/25/2015, Disp: , Rfl:    dicyclomine  (BENTYL ) 20 MG tablet, Take 1 tablet (20 mg total) by mouth 2 (two) times daily., Disp: 20 tablet, Rfl: 0   estradiol  (ESTRACE ) 0.5 MG tablet, Take 1 mg by mouth at bedtime. , Disp: , Rfl:    EVZIO 0.4 MG/0.4ML SOAJ, AUTO INJECT AS  NEEDED IN CASE OF EMERGENCY WHERE GRANDCHILDREN MAY GET A HOLD OF PATIENTS PAIN MEDICATIONS., Disp: , Rfl: 0   fluticasone  (FLONASE ) 50 MCG/ACT nasal spray, SPRAY 2 SPRAYS INTO EACH NOSTRIL DAILY (Patient taking differently: Place 2 sprays into both nostrils daily.), Disp: 16 g, Rfl: 0   furosemide  (LASIX ) 40 MG tablet, Take 40 mg by mouth as needed., Disp: , Rfl:    hydrochlorothiazide (MICROZIDE) 12.5 MG capsule, Take 12.5 mg by mouth daily as needed (Patient states she takes it prn if SBP >177mmHg)., Disp: , Rfl:    HYDROmorphone  (DILAUDID ) 2 MG tablet, Take 2 mg by mouth every 4 (four) hours as needed for severe pain., Disp: , Rfl:    hydrOXYzine  (ATARAX /VISTARIL ) 25 MG tablet, Take 50 mg by mouth every 4 (four) hours as needed for itching. Alternates with benadryl , Disp: , Rfl:    ketoconazole (NIZORAL) 2 % shampoo, One application once a month as needed for flaking, Disp: , Rfl: 3   ketorolac  (TORADOL ) 60 MG/2ML SOLN injection, ADMINISTER 2 ML(60 MG) IN THE MUSCLE EVERY 6 HOURS AS NEEDED FOR HEADACHE, Disp: 16 mL, Rfl: 0   levothyroxine  (SYNTHROID , LEVOTHROID) 50 MCG tablet, Take 200 mcg by mouth daily. Pt can only take name brand, Disp: , Rfl:    metoCLOPramide  (REGLAN ) 10 MG tablet, Take 1 tablet (10 mg total) by mouth every 8 (eight) hours as needed for up to 10 days for nausea or vomiting., Disp: 30 tablet, Rfl: 0   Multiple Vitamin (MULTIVITAMIN WITH MINERALS) TABS tablet, Take 1 tablet by mouth at bedtime., Disp: , Rfl:    NON FORMULARY, 1 mL by Subdermal route every Tuesday. Allergy  vaccine, Disp: , Rfl:    OnabotulinumtoxinA  (BOTOX  IJ), Inject as directed every 3 (three) months. For migraines, Disp: , Rfl:    ondansetron  (ZOFRAN -ODT) 4 MG disintegrating tablet, Take 1 tablet (4 mg total) by mouth every 8 (eight) hours as needed., Disp: 20 tablet, Rfl: 0   potassium chloride  (MICRO-K ) 10 MEQ CR capsule, Take 10 mEq by mouth 2 (two) times daily., Disp: , Rfl:  tiZANidine (ZANAFLEX) 4 MG  tablet, Take 4 mg by mouth 3 (three) times daily. , Disp: , Rfl:    topiramate  (TOPAMAX ) 25 MG tablet, Take 200 mg by mouth 2 (two) times daily. , Disp: , Rfl: 1   vitamin C (ASCORBIC ACID) 500 MG tablet, Take 1,000 mg by mouth daily., Disp: , Rfl:    HYDROmorphone  (DILAUDID ) 2 MG tablet, Take 1 tablet (2 mg total) by mouth every 8 (eight) hours as needed for up to 7 days for severe pain. Must last 7 days., Disp: 21 tablet, Rfl: 0  Consent:   Informed Consent   Shared Decision Making/Informed Consent The risks [chest pain, shortness of breath, cardiac arrhythmias, dizziness, blood pressure fluctuations, myocardial infarction, stroke/transient ischemic attack, nausea, vomiting, allergic reaction, radiation exposure, metallic taste sensation and life-threatening complications (estimated to be 1 in 10,000)], benefits (risk stratification, diagnosing coronary artery disease, treatment guidance) and alternatives of a nuclear stress test were discussed in detail with Ms. Blossom and she agrees to proceed.     Disposition:   1 year or sooner if needed Patient may be asked to follow-up sooner based on the results of the above-mentioned testing.  Her questions and concerns were addressed to her satisfaction. She voices understanding of the recommendations provided during this encounter.    Signed, Madonna Michele HAS, [facility]  A Division of [facility] [address] [place], [place] [address]  10/14/2023 5:16 PM

## 2023-10-17 ENCOUNTER — Other Ambulatory Visit: Payer: Self-pay

## 2023-10-17 DIAGNOSIS — I959 Hypotension, unspecified: Secondary | ICD-10-CM

## 2023-10-18 ENCOUNTER — Other Ambulatory Visit: Payer: Self-pay

## 2023-10-18 DIAGNOSIS — I2584 Coronary atherosclerosis due to calcified coronary lesion: Secondary | ICD-10-CM

## 2023-10-31 ENCOUNTER — Other Ambulatory Visit: Payer: Self-pay

## 2023-10-31 DIAGNOSIS — I700 Atherosclerosis of aorta: Secondary | ICD-10-CM

## 2023-10-31 DIAGNOSIS — I2584 Coronary atherosclerosis due to calcified coronary lesion: Secondary | ICD-10-CM

## 2023-11-07 ENCOUNTER — Telehealth (HOSPITAL_COMMUNITY): Payer: Self-pay | Admitting: Cardiology

## 2023-11-07 NOTE — Telephone Encounter (Signed)
 I called patient to schedule echocardiogram and Myoview per Dr. Michele. 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 Order will be removed from the echo and nuc WQ. Thank you.

## 2023-11-07 NOTE — Telephone Encounter (Signed)
 At the last visit we discussed nuclear stress test exercise versus pharmacologic or coronary CTA. Patient would like to avoid coronary CTA as she is allergic to contrast and does not want to undergo prep. In addition, she wants to avoid pharmacological stress given the fact that she has 28 allergies. She is willing to consider exercise nuclear stress test once she heals from her right knee surgery and feels comfortable ambulating on her treadmill.   She can call us  back but she ready to consider testing.   Thanks,   Dr. Bush Murdoch

## 2023-11-07 NOTE — Telephone Encounter (Signed)
 MyChart message sent to patient.

## 2023-11-25 ENCOUNTER — Other Ambulatory Visit (HOSPITAL_BASED_OUTPATIENT_CLINIC_OR_DEPARTMENT_OTHER): Payer: Self-pay

## 2023-11-25 DIAGNOSIS — M26623 Arthralgia of bilateral temporomandibular joint: Secondary | ICD-10-CM

## 2023-11-25 DIAGNOSIS — M2652 Limited mandibular range of motion: Secondary | ICD-10-CM

## 2023-11-25 DIAGNOSIS — M26643 Arthritis of bilateral temporomandibular joint: Secondary | ICD-10-CM

## 2023-11-28 ENCOUNTER — Ambulatory Visit: Admitting: Cardiology

## 2023-11-29 ENCOUNTER — Encounter (HOSPITAL_COMMUNITY): Payer: Self-pay

## 2023-11-29 ENCOUNTER — Ambulatory Visit (HOSPITAL_COMMUNITY): Admission: RE | Admit: 2023-11-29 | Source: Ambulatory Visit

## 2023-11-30 ENCOUNTER — Ambulatory Visit (HOSPITAL_COMMUNITY): Admission: RE | Admit: 2023-11-30 | Source: Ambulatory Visit

## 2023-12-01 ENCOUNTER — Ambulatory Visit (HOSPITAL_COMMUNITY)
Admission: RE | Admit: 2023-12-01 | Discharge: 2023-12-01 | Disposition: A | Discharge status: Home / Self Care | Source: Ambulatory Visit

## 2023-12-01 DIAGNOSIS — M26643 Arthritis of bilateral temporomandibular joint: Secondary | ICD-10-CM | POA: Diagnosis present

## 2023-12-01 DIAGNOSIS — M2652 Limited mandibular range of motion: Secondary | ICD-10-CM | POA: Insufficient documentation

## 2023-12-01 DIAGNOSIS — M26623 Arthralgia of bilateral temporomandibular joint: Secondary | ICD-10-CM | POA: Insufficient documentation

## 2023-12-12 ENCOUNTER — Ambulatory Visit (HOSPITAL_COMMUNITY)

## 2023-12-12 DIAGNOSIS — E538 Deficiency of other specified B group vitamins: Secondary | ICD-10-CM | POA: Insufficient documentation

## 2023-12-12 DIAGNOSIS — I878 Other specified disorders of veins: Secondary | ICD-10-CM | POA: Insufficient documentation

## 2023-12-15 ENCOUNTER — Ambulatory Visit (HOSPITAL_COMMUNITY)

## 2023-12-19 ENCOUNTER — Ambulatory Visit (HOSPITAL_COMMUNITY)
Admission: RE | Admit: 2023-12-19 | Discharge: 2023-12-19 | Disposition: A | Discharge status: Home / Self Care | Source: Ambulatory Visit | Attending: Internal Medicine | Admitting: Internal Medicine

## 2023-12-19 DIAGNOSIS — I878 Other specified disorders of veins: Secondary | ICD-10-CM

## 2023-12-19 DIAGNOSIS — E538 Deficiency of other specified B group vitamins: Secondary | ICD-10-CM | POA: Diagnosis present

## 2023-12-19 MED ORDER — HEPARIN SOD (PORK) LOCK FLUSH 100 UNIT/ML IV SOLN
500.0000 [IU] | INTRAVENOUS | Status: AC | PRN
  Administered 2023-12-19: 500 [IU]
  Filled 2023-12-19: qty 5

## 2023-12-19 MED ORDER — SODIUM CHLORIDE 0.9% FLUSH
10.0000 mL | INTRAVENOUS | Status: AC | PRN
  Administered 2023-12-19: 10 mL

## 2023-12-19 NOTE — Progress Notes (Signed)
 PATIENT CARE CENTER NOTE   Provider: Cristopher Bottcher, NP   Procedure: Port Flush    Note: Patient's right chest Port-a-cath [facility]) accessed using sterile technique. PAC flushed multiple times but only scant blood return noted. Per patient, she has not had PAC flushed since earlier this year and Heparin  was not used.  Patient also needs labs drawn from port prior to surgery next month. Notified Dr. Teodora office that patient's port is not giving blood return and may need Cathflo placed. Verbal order received from Bottcher Cristopher, NP to use Cathflo 2 mg to remove any blockages from patient's PAC.  Patient advised that she would have to wait for 2 or more hours once Cathflo is placed.  Patient unable to wait today and wants to reschedule appointment. Appointment rescheduled for 10/29 at 9:00 am.  Patient's PAC flushed with 0.9% NS and 500 units Heparin  and de-accessed.  Gauze dressing placed over site. Patient alert, oriented and ambulatory at discharge.

## 2023-12-21 ENCOUNTER — Ambulatory Visit (HOSPITAL_COMMUNITY)
Admission: RE | Admit: 2023-12-21 | Discharge: 2023-12-21 | Disposition: A | Discharge status: Home / Self Care | Source: Ambulatory Visit | Attending: Internal Medicine | Admitting: Internal Medicine

## 2023-12-21 DIAGNOSIS — E538 Deficiency of other specified B group vitamins: Secondary | ICD-10-CM

## 2023-12-21 DIAGNOSIS — I878 Other specified disorders of veins: Secondary | ICD-10-CM

## 2023-12-21 LAB — LIPID PANEL
Cholesterol: 189 mg/dL (ref 0–200)
HDL: 43 mg/dL (ref >40)
LDL Cholesterol: 123 mg/dL — ABNORMAL HIGH (ref 0–99)
Total CHOL/HDL Ratio: 4.4 ratio
Triglycerides: 114 mg/dL (ref <150)
VLDL: 23 mg/dL (ref 0–40)

## 2023-12-21 LAB — COMPREHENSIVE METABOLIC PANEL WITH GFR
ALT: 5 U/L (ref 0–44)
AST: 22 U/L (ref 15–41)
Albumin: 4.1 g/dL (ref 3.5–5.0)
Alkaline Phosphatase: 54 U/L (ref 38–126)
Anion gap: 11 (ref 5–15)
BUN: 22 mg/dL — ABNORMAL HIGH (ref 6–20)
CO2: 21 mmol/L — ABNORMAL LOW (ref 22–32)
Calcium: 8.9 mg/dL (ref 8.9–10.3)
Chloride: 115 mmol/L — ABNORMAL HIGH (ref 98–111)
Creatinine, Ser: 1.08 mg/dL — ABNORMAL HIGH (ref 0.44–1.00)
GFR, Estimated: 60 mL/min — ABNORMAL LOW (ref >60)
Glucose, Bld: 79 mg/dL (ref 70–99)
Potassium: 3.5 mmol/L (ref 3.5–5.1)
Sodium: 146 mmol/L — ABNORMAL HIGH (ref 135–145)
Total Bilirubin: 0.3 mg/dL (ref 0.0–1.2)
Total Protein: 7 g/dL (ref 6.5–8.1)

## 2023-12-21 LAB — CBC WITH DIFFERENTIAL/PLATELET
Abs Immature Granulocytes: 0 K/uL (ref 0.00–0.07)
Basophils Absolute: 0 K/uL (ref 0.0–0.1)
Basophils Relative: 0 %
Eosinophils Absolute: 0.3 K/uL (ref 0.0–0.5)
Eosinophils Relative: 6 %
HCT: 32 % — ABNORMAL LOW (ref 36.0–46.0)
Hemoglobin: 10.5 g/dL — ABNORMAL LOW (ref 12.0–15.0)
Immature Granulocytes: 0 %
Lymphocytes Relative: 38 %
Lymphs Abs: 1.7 K/uL (ref 0.7–4.0)
MCH: 30.1 pg (ref 26.0–34.0)
MCHC: 32.8 g/dL (ref 30.0–36.0)
MCV: 91.7 fL (ref 80.0–100.0)
Monocytes Absolute: 0.4 K/uL (ref 0.1–1.0)
Monocytes Relative: 8 %
Neutro Abs: 2.1 K/uL (ref 1.7–7.7)
Neutrophils Relative %: 48 %
Platelets: 259 K/uL (ref 150–400)
RBC: 3.49 MIL/uL — ABNORMAL LOW (ref 3.87–5.11)
RDW: 13.9 % (ref 11.5–15.5)
WBC: 4.4 K/uL (ref 4.0–10.5)
nRBC: 0 % (ref 0.0–0.2)

## 2023-12-21 LAB — TSH: TSH: 0.283 u[IU]/mL — ABNORMAL LOW (ref 0.350–4.500)

## 2023-12-21 LAB — HEMOGLOBIN A1C
Hgb A1c MFr Bld: 5 % (ref 4.8–5.6)
Mean Plasma Glucose: 96.8 mg/dL

## 2023-12-21 LAB — T4, FREE: Free T4: 1.12 ng/dL (ref 0.61–1.12)

## 2023-12-21 LAB — VITAMIN B12: Vitamin B-12: 1069 pg/mL — ABNORMAL HIGH (ref 180–914)

## 2023-12-21 MED ORDER — HEPARIN SOD (PORK) LOCK FLUSH 100 UNIT/ML IV SOLN
500.0000 [IU] | INTRAVENOUS | Status: AC | PRN
  Administered 2023-12-21: 500 [IU]
  Filled 2023-12-21: qty 5

## 2023-12-21 MED ORDER — SODIUM CHLORIDE 0.9% FLUSH
10.0000 mL | INTRAVENOUS | Status: AC | PRN
  Administered 2023-12-21: 10 mL

## 2023-12-21 NOTE — Progress Notes (Signed)
 PATIENT CARE CENTER NOTE:  Provider:  Lyle Bertrand NP     Procedure: Port-a-cath flush + labs     Note: Patient's PAC accessed using sterile technique, flushed with 10 cc 0.9% sodium chloride  and heparin . Brisk blood return noted without difficulty. Labs collected per orders ( CBC w diff, CMP, lipid panel, Hgb A1C, Vitamin B12, TSH, Free T4). Pt inquired about having a rheumatoid panel drawn. Spoke to clinical staff at [facility] x 2 but unable to obtain verbal order from providers. PAC de-accessed and site covered with band aid. Patient tolerated well. Patient declined printed AVS, uses Mychart. Pt instructed to schedule monthly port flush appointment at front desk prior to leaving, verbalized understanding.  Alert, oriented and ambulatory at discharge.

## 2024-01-04 ENCOUNTER — Ambulatory Visit (HOSPITAL_COMMUNITY)
Admission: RE | Admit: 2024-01-04 | Discharge: 2024-01-04 | Disposition: A | Discharge status: Home / Self Care | Source: Ambulatory Visit | Attending: Internal Medicine | Admitting: Internal Medicine

## 2024-01-04 MED ORDER — SODIUM CHLORIDE 0.9% FLUSH: 10.0000 mL | INTRAVENOUS | Status: DC | PRN

## 2024-01-04 MED ORDER — HEPARIN SOD (PORK) LOCK FLUSH 100 UNIT/ML IV SOLN
500.0000 [IU] | INTRAVENOUS | Status: DC | PRN
  Filled 2024-01-04: qty 5

## 2024-01-04 NOTE — Progress Notes (Addendum)
 Pt arrived for port-a- cath access and flush. At arrival, pt states she is having surgery tomorrow morning and per the surgery center the port is to be left accessed at discharge from today's visit for use during procedure.  [facility]cath not accessed at [facility] today. Explained to pt  that the  port-a cath cannot remain accessed at discharge due to policy and pt is not having procedure done at a [facility] facility, pt verbalized understanding. Corean, clinical staff for surgeon Dr. Bluford at [facility] for Oral Maxillo facial surgery notified of this; however she states that they have nurses at the surgery center that are able to access the port tomorrow if the pt is provided with supplies. Natalie Boner , Practice Administrator  for the [facility] notified, approved the supplies be provided to the pt. Pt provided with necessary supplies, including sterile access kit and heparin , to bring with her to the surgery center. Pt alert, oriented and ambulatory at discharge.

## 2024-01-18 ENCOUNTER — Ambulatory Visit (HOSPITAL_COMMUNITY)

## 2024-05-31 ENCOUNTER — Ambulatory Visit: Admitting: Internal Medicine
